# Patient Record
Sex: Male | Born: 1940 | Race: White | Hispanic: No | Marital: Married | Smoking: Current every day smoker
Health system: Southern US, Community
[De-identification: ages and names within clinical notes are randomized; demographics above are authoritative.]

## PROBLEM LIST (undated history)

## (undated) DIAGNOSIS — E785 Hyperlipidemia, unspecified: Secondary | ICD-10-CM

## (undated) DIAGNOSIS — G8929 Other chronic pain: Secondary | ICD-10-CM

## (undated) DIAGNOSIS — IMO0001 Reserved for inherently not codable concepts without codable children: Secondary | ICD-10-CM

## (undated) DIAGNOSIS — I10 Essential (primary) hypertension: Secondary | ICD-10-CM

## (undated) DIAGNOSIS — Z960 Presence of urogenital implants: Secondary | ICD-10-CM

## (undated) DIAGNOSIS — Z9989 Dependence on other enabling machines and devices: Secondary | ICD-10-CM

## (undated) DIAGNOSIS — J45909 Unspecified asthma, uncomplicated: Secondary | ICD-10-CM

## (undated) DIAGNOSIS — R0602 Shortness of breath: Secondary | ICD-10-CM

## (undated) DIAGNOSIS — T7840XA Allergy, unspecified, initial encounter: Secondary | ICD-10-CM

## (undated) DIAGNOSIS — M25569 Pain in unspecified knee: Secondary | ICD-10-CM

## (undated) DIAGNOSIS — Z720 Tobacco use: Secondary | ICD-10-CM

## (undated) DIAGNOSIS — G4733 Obstructive sleep apnea (adult) (pediatric): Secondary | ICD-10-CM

## (undated) DIAGNOSIS — Z87442 Personal history of urinary calculi: Secondary | ICD-10-CM

## (undated) DIAGNOSIS — M1990 Unspecified osteoarthritis, unspecified site: Secondary | ICD-10-CM

## (undated) DIAGNOSIS — E7800 Pure hypercholesterolemia, unspecified: Secondary | ICD-10-CM

## (undated) DIAGNOSIS — E669 Obesity, unspecified: Secondary | ICD-10-CM

## (undated) DIAGNOSIS — N400 Enlarged prostate without lower urinary tract symptoms: Secondary | ICD-10-CM

## (undated) DIAGNOSIS — J449 Chronic obstructive pulmonary disease, unspecified: Secondary | ICD-10-CM

## (undated) DIAGNOSIS — I714 Abdominal aortic aneurysm, without rupture: Secondary | ICD-10-CM

## (undated) HISTORY — DX: Allergy, unspecified, initial encounter: T78.40XA

## (undated) HISTORY — DX: Obesity, unspecified: E66.9

## (undated) HISTORY — DX: Dependence on other enabling machines and devices: Z99.89

## (undated) HISTORY — DX: Obstructive sleep apnea (adult) (pediatric): G47.33

## (undated) HISTORY — DX: Pure hypercholesterolemia, unspecified: E78.00

## (undated) HISTORY — DX: Reserved for inherently not codable concepts without codable children: IMO0001

## (undated) HISTORY — DX: Hyperlipidemia, unspecified: E78.5

## (undated) HISTORY — DX: Chronic obstructive pulmonary disease, unspecified: J44.9

## (undated) HISTORY — DX: Unspecified osteoarthritis, unspecified site: M19.90

## (undated) HISTORY — DX: Shortness of breath: R06.02

## (undated) HISTORY — DX: Abdominal aortic aneurysm, without rupture: I71.4

## (undated) HISTORY — DX: Unspecified asthma, uncomplicated: J45.909

## (undated) HISTORY — DX: Essential (primary) hypertension: I10

## (undated) HISTORY — DX: Tobacco use: Z72.0

---

## 1995-06-24 HISTORY — PX: CHOLECYSTECTOMY: SHX55

## 2000-05-06 ENCOUNTER — Ambulatory Visit (HOSPITAL_COMMUNITY): Admission: RE | Admit: 2000-05-06 | Discharge: 2000-05-06 | Payer: Self-pay | Admitting: Cardiology

## 2003-08-21 ENCOUNTER — Other Ambulatory Visit: Admission: RE | Admit: 2003-08-21 | Discharge: 2003-08-21 | Payer: Self-pay | Admitting: Dermatology

## 2003-10-19 ENCOUNTER — Ambulatory Visit (HOSPITAL_COMMUNITY): Admission: RE | Admit: 2003-10-19 | Discharge: 2003-10-19 | Payer: Self-pay | Admitting: Pulmonary Disease

## 2003-11-13 ENCOUNTER — Ambulatory Visit (HOSPITAL_COMMUNITY): Admission: RE | Admit: 2003-11-13 | Discharge: 2003-11-13 | Payer: Self-pay | Admitting: Pulmonary Disease

## 2004-06-23 HISTORY — PX: COLONOSCOPY: SHX174

## 2004-07-01 ENCOUNTER — Ambulatory Visit: Payer: Self-pay | Admitting: Internal Medicine

## 2004-07-01 ENCOUNTER — Ambulatory Visit (HOSPITAL_COMMUNITY): Admission: RE | Admit: 2004-07-01 | Discharge: 2004-07-01 | Payer: Self-pay | Admitting: Internal Medicine

## 2005-03-06 ENCOUNTER — Observation Stay (HOSPITAL_COMMUNITY): Admission: EM | Admit: 2005-03-06 | Discharge: 2005-03-07 | Payer: Self-pay | Admitting: Emergency Medicine

## 2005-03-07 ENCOUNTER — Ambulatory Visit: Payer: Self-pay | Admitting: Cardiology

## 2005-03-10 ENCOUNTER — Encounter (HOSPITAL_COMMUNITY): Admission: RE | Admit: 2005-03-10 | Discharge: 2005-03-10 | Payer: Self-pay | Admitting: Cardiology

## 2005-03-10 ENCOUNTER — Ambulatory Visit: Payer: Self-pay | Admitting: *Deleted

## 2005-03-13 ENCOUNTER — Ambulatory Visit: Payer: Self-pay | Admitting: Cardiology

## 2005-08-07 ENCOUNTER — Other Ambulatory Visit: Admission: RE | Admit: 2005-08-07 | Discharge: 2005-08-07 | Payer: Self-pay | Admitting: Unknown Physician Specialty

## 2005-10-14 ENCOUNTER — Ambulatory Visit (HOSPITAL_COMMUNITY): Admission: RE | Admit: 2005-10-14 | Discharge: 2005-10-14 | Payer: Self-pay | Admitting: Pulmonary Disease

## 2006-10-20 ENCOUNTER — Ambulatory Visit (HOSPITAL_COMMUNITY): Admission: RE | Admit: 2006-10-20 | Discharge: 2006-10-20 | Payer: Self-pay | Admitting: Urology

## 2006-12-19 ENCOUNTER — Emergency Department (HOSPITAL_COMMUNITY): Admission: EM | Admit: 2006-12-19 | Discharge: 2006-12-19 | Payer: Self-pay | Admitting: Emergency Medicine

## 2006-12-22 ENCOUNTER — Ambulatory Visit (HOSPITAL_COMMUNITY): Admission: RE | Admit: 2006-12-22 | Discharge: 2006-12-22 | Payer: Self-pay | Admitting: Pulmonary Disease

## 2007-01-25 ENCOUNTER — Ambulatory Visit: Payer: Self-pay | Admitting: Surgery

## 2007-03-09 ENCOUNTER — Ambulatory Visit: Payer: Self-pay | Admitting: Pulmonary Disease

## 2007-03-24 ENCOUNTER — Ambulatory Visit: Payer: Self-pay | Admitting: Pulmonary Disease

## 2007-03-24 ENCOUNTER — Ambulatory Visit (HOSPITAL_BASED_OUTPATIENT_CLINIC_OR_DEPARTMENT_OTHER): Admission: RE | Admit: 2007-03-24 | Discharge: 2007-03-24 | Payer: Self-pay | Admitting: Pulmonary Disease

## 2007-04-19 ENCOUNTER — Ambulatory Visit (HOSPITAL_BASED_OUTPATIENT_CLINIC_OR_DEPARTMENT_OTHER): Admission: RE | Admit: 2007-04-19 | Discharge: 2007-04-19 | Payer: Self-pay | Admitting: Pulmonary Disease

## 2007-04-19 ENCOUNTER — Ambulatory Visit: Payer: Self-pay | Admitting: Pulmonary Disease

## 2007-05-06 ENCOUNTER — Encounter: Payer: Self-pay | Admitting: Pulmonary Disease

## 2007-05-06 DIAGNOSIS — M109 Gout, unspecified: Secondary | ICD-10-CM | POA: Insufficient documentation

## 2007-05-06 DIAGNOSIS — E7800 Pure hypercholesterolemia, unspecified: Secondary | ICD-10-CM | POA: Insufficient documentation

## 2007-05-06 DIAGNOSIS — I10 Essential (primary) hypertension: Secondary | ICD-10-CM | POA: Insufficient documentation

## 2007-05-06 DIAGNOSIS — F329 Major depressive disorder, single episode, unspecified: Secondary | ICD-10-CM

## 2007-05-06 DIAGNOSIS — Z87898 Personal history of other specified conditions: Secondary | ICD-10-CM | POA: Insufficient documentation

## 2007-05-06 DIAGNOSIS — I714 Abdominal aortic aneurysm, without rupture, unspecified: Secondary | ICD-10-CM | POA: Insufficient documentation

## 2007-05-06 DIAGNOSIS — F3289 Other specified depressive episodes: Secondary | ICD-10-CM | POA: Insufficient documentation

## 2007-06-09 ENCOUNTER — Ambulatory Visit: Payer: Self-pay | Admitting: Pulmonary Disease

## 2007-06-09 DIAGNOSIS — G4733 Obstructive sleep apnea (adult) (pediatric): Secondary | ICD-10-CM | POA: Insufficient documentation

## 2007-06-09 DIAGNOSIS — Z720 Tobacco use: Secondary | ICD-10-CM | POA: Insufficient documentation

## 2007-06-09 DIAGNOSIS — F17200 Nicotine dependence, unspecified, uncomplicated: Secondary | ICD-10-CM | POA: Insufficient documentation

## 2007-07-02 ENCOUNTER — Encounter: Payer: Self-pay | Admitting: Pulmonary Disease

## 2007-07-05 ENCOUNTER — Telehealth (INDEPENDENT_AMBULATORY_CARE_PROVIDER_SITE_OTHER): Payer: Self-pay | Admitting: *Deleted

## 2007-07-06 ENCOUNTER — Encounter: Payer: Self-pay | Admitting: Pulmonary Disease

## 2008-01-22 DIAGNOSIS — I714 Abdominal aortic aneurysm, without rupture, unspecified: Secondary | ICD-10-CM

## 2008-01-22 HISTORY — DX: Abdominal aortic aneurysm, without rupture: I71.4

## 2008-01-22 HISTORY — DX: Abdominal aortic aneurysm, without rupture, unspecified: I71.40

## 2008-02-21 ENCOUNTER — Ambulatory Visit: Payer: Self-pay | Admitting: Surgery

## 2008-05-04 ENCOUNTER — Ambulatory Visit: Payer: Self-pay | Admitting: Vascular Surgery

## 2008-07-03 ENCOUNTER — Ambulatory Visit (HOSPITAL_COMMUNITY): Admission: RE | Admit: 2008-07-03 | Discharge: 2008-07-03 | Payer: Self-pay | Admitting: Pulmonary Disease

## 2008-08-07 ENCOUNTER — Ambulatory Visit: Payer: Self-pay | Admitting: Surgery

## 2008-12-04 ENCOUNTER — Telehealth: Payer: Self-pay | Admitting: Pulmonary Disease

## 2008-12-20 ENCOUNTER — Ambulatory Visit: Payer: Self-pay | Admitting: Pulmonary Disease

## 2009-06-23 DIAGNOSIS — IMO0001 Reserved for inherently not codable concepts without codable children: Secondary | ICD-10-CM

## 2009-06-23 HISTORY — DX: Reserved for inherently not codable concepts without codable children: IMO0001

## 2009-07-11 ENCOUNTER — Encounter: Payer: Self-pay | Admitting: Pulmonary Disease

## 2010-03-11 ENCOUNTER — Ambulatory Visit: Payer: Self-pay | Admitting: Cardiology

## 2010-03-18 ENCOUNTER — Ambulatory Visit: Payer: Self-pay | Admitting: Cardiology

## 2010-06-10 ENCOUNTER — Ambulatory Visit: Payer: Self-pay | Admitting: Cardiology

## 2010-07-25 NOTE — Letter (Signed)
Summary: LMN/[facility]  LMN/[facility]   Imported By: Lester  07/16/2009 14:23:06  _____________________________________________________________________  External Attachment:    Type:   Image     Comment:   External Document

## 2010-08-19 ENCOUNTER — Ambulatory Visit (INDEPENDENT_AMBULATORY_CARE_PROVIDER_SITE_OTHER): Payer: Medicare Other | Admitting: Surgery

## 2010-08-19 ENCOUNTER — Encounter (INDEPENDENT_AMBULATORY_CARE_PROVIDER_SITE_OTHER): Payer: Medicare Other

## 2010-08-19 DIAGNOSIS — I714 Abdominal aortic aneurysm, without rupture: Secondary | ICD-10-CM

## 2010-08-20 NOTE — Assessment & Plan Note (Signed)
OFFICE VISIT  Kevin Frank, Kevin Frank DOB:  24-Dec-1940                                       08/19/2010 EXBMW#:[id number]  REASON FOR VISIT:  Followup aneurysm.  HISTORY:  This is a 70 year old gentleman that I am following for an abdominal aortic aneurysm that was detected by MRI in 2008 for back pain.  I last saw him in 2010.  At that time his aneurysm was approximately 3 cm and he was asymptomatic.  He comes back in today for followup.  The patient is diabetic.  He tells me his most recent A1c is 5.7.  He also suffers from hypercholesterolemia.  His most recent cholesterol evaluation per the patient was a good report.  His blood pressure is also controlled medically.  REVIEW OF SYSTEMS:  VASCULAR:  Positive for pain in legs with walking. CARDIAC:  Negative for chest pain or shortness of breath. GU:  Positive for frequent urination. MUSCULOSKELETAL:  Positive for arthritis. All other review of systems are negative.  SOCIAL HISTORY:  The patient continues to smoke 2 packs per day.  PHYSICAL EXAMINATION:  Vital signs:  Heart rate 63, blood pressure 130/72, O2 sat 99% on room air.  General:  He is well-appearing, in no distress.  HEENT:  Within normal limits.  Lungs:  Clear bilaterally. Occasional wheezes.  Cardiovascular:  Regular rate and rhythm.  No murmur.  No carotid bruits.  Abdomen:  Soft, nontender. Musculoskeletal:  Without major deformity.  Neurological:  No focal deficits.  Skin:  Without rash.  DIAGNOSTIC STUDIES:  Duplex ultrasound was performed today which shows that his aneurysm has increased in size.  It now measures 3.3 x 3.2, previously it was 3.0 x 3.0.  ASSESSMENT:  Abdominal aortic aneurysm.  PLAN:  The patient continues to have a very small abdominal aortic aneurysm.  I think his risk of rupture at this size is extremely low. However, it may grow over time and therefore we will need to continue to follow him with ultrasound.  I  have scheduled him to come back in 1 year.  I spent approximately 8 minutes discussing smoking cessation with the patient.  We discussed the financial burden as well as the health burden that he is taking and I proposed that he try to cut back from 2 packs to 1 pack over the next year.    Jorge Ny, MD Electronically Signed  VWB/MEDQ  D:  08/19/2010  T:  08/20/2010  Job:  [id number]  cc:   Ramon Dredge L. Juanetta Gosling, M.D.

## 2010-08-27 NOTE — Procedures (Unsigned)
DUPLEX ULTRASOUND OF ABDOMINAL AORTA  INDICATION:  AAA followup.  HISTORY: Diabetes:  Yes. Cardiac:  No. Hypertension:  Yes. Smoking:  Yes, 2 packs per day. Connective Tissue Disorder: Family History:  No. Previous Surgery:  No.  DUPLEX EXAM:         AP (cm)                   TRANSVERSE (cm) Proximal             3.34 cm                   3.23 cm Mid                  2.16 cm                   2.20 cm Distal               2.54 cm                   2.48 cm Right Iliac          0.86 cm                   1.01 cm Left Iliac           0.66 cm                   0.84 cm  PREVIOUS:  Date: 08/07/2008  AP:  3.0  TRANSVERSE:  3.0  IMPRESSION: 1. Aneurysmal dilatation of the abdominal aorta that measures 3.34 X     3.23 cm. 2. This is a slightly larger diameter than the previous examination.  ___________________________________________ V. Charlena Cross, MD  EM/MEDQ  D:  08/19/2010  T:  08/19/2010  Job:  [id number]

## 2010-11-05 NOTE — Assessment & Plan Note (Signed)
OFFICE VISIT   Kevin Frank, Kevin Frank  DOB:  24-Mar-1941                                       01/25/2007  [id number]#:[id number]   REASON FOR VISIT:  Evaluate new diagnosis of abdominal aortic aneurysm.   HISTORY:  This is a 70 year old gentleman I am seeing referred by Dr.  Juanetta Gosling for evaluation of a new diagnosis of abdominal aortic aneurysm.  The patient recently was evaluated by MRI for low back pain.  An  incidental finding on his MRI was a 3.2 cm infrarenal abdominal aortic  aneurysm.  The patient does not have any complaints of abdominal pain.   PAST MEDICAL HISTORY:  Significant for high blood pressure, high  cholesterol, chronic obstructive pulmonary disease and gout.   PAST SURGICAL HISTORY:  Laparoscopic cholecystectomy.   FAMILY HISTORY:  Significant for a mother with diabetes, cerebrovascular  disease and lymphoma.   SOCIAL HISTORY:  The patient is a two pack a day smoker for 50+ years.  He is not a regular drinker.   ALLERGIES:  No known drug allergies.   MEDICATIONS:  Simvastatin 40 mg once daily.  Flomax 0.4 mg once daily.  Sertraline 50 mg once daily.  Allopurinol 300 mg once daily.  Metoprolol extended release 25 mg a half a tab daily.  Detrol long acting 4 mg once daily.   REVIEW OF SYMPTOMS:  GENERAL:  No weight loss, loss of appetite.  CARDIAC:  No chest pain.  PULMONARY:  Productive cough with wheezing and shortness of breath.  GI:  Patient has complaints of diarrhea.  GU:  No hematuria or dysuria.  VASCULAR:  Negative for claudication or rest pain.  NEUROLOGIC:  No dizziness, blackouts, headaches or seizures.  PSYCHIATRIC:  History of depression.   PHYSICAL EXAMINATION:  Heart rate 75.  Left arm blood pressure is  178/116.  Right arm blood pressure is 190/114.  GENERAL:  He is well appearing, no acute distress.  CARDIOVASCULAR:  Regular rate and rhythm.  NECK:  No carotid bruits.  RESPIRATIONS:  Clear and nonlabored.  ABDOMEN:   Soft, nontender, no pulsatile mass.  EXTREMITIES:  Warm and well perfused.  NEUROLOGIC:  No gross deficits.   DIAGNOSTIC STUDIES:  Patient comes with a report of an MRI which reveals  a 3.2 cm transverse dimension infrarenal abdominal aortic aneurysm with  no extension into the iliac arteries.   ASSESSMENT AND PLAN:  A 70 year old with asymptomatic small infrarenal  abdominal aortic aneurysm.   Plan at this time:  1. The patient will follow up in one year with an abdominal ultrasound      to evaluate the size of this aneurysm.  2. I will encourage the patient with tobacco cessation.  He was told      the hallmarks of a ruptured aneurysm and to go to the emergency      department should this occur; however, this is unlikely given the      size of his aneurysm.   Jorge Ny, MD  Electronically Signed   VWB/MEDQ  D:  01/25/2007  T:  01/26/2007  Job:  [id number]

## 2010-11-05 NOTE — Procedures (Signed)
NAME:  Kevin Frank, Kevin Frank              ACCOUNT NO.:  [id number]   MEDICAL RECORD NO.:  [id number]          PATIENT TYPE:  OUT   LOCATION:  SLEEP CENTER                 FACILITY:  [facility]   PHYSICIAN:  Coralyn Helling, MD        DATE OF BIRTH:  [birth year]   DATE OF STUDY:  04/19/2007                            NOCTURNAL POLYSOMNOGRAM   REFERRING PHYSICIAN:  Coralyn Helling, MD   INDICATION FOR STUDY:  This is an individual who had an overnight  polysomnogram done on March 24, 2007, and was found to have severe  obstructive sleep apnea with an apnea/hypopnea index of 53 and an oxygen  saturation nadir of 74%.  He was tried on CPAP but had a suboptimal  titration.  As a result, he returns to the sleep lab for a BiPAP  titration study.   EPWORTH SLEEPINESS SCORE:  14   MEDICATIONS:  Sertraline, simvastatin, metoprolol, furosemide,  lisinopril, Detrol, Flomax, allopurinol.   SLEEP ARCHITECTURE:  Total recording time was 391 minutes.  Total sleep  time was 347 minutes.  Sleep efficiency is 88%.  The patient was  observed in all stages of sleep but had a relative reduction in the  percentage of slow-wave sleep to 1% of the study.  Sleep latency was 8.5  minutes which is slightly reduced.  REM latency was 69 minutes.  The  patient was observed in both the supine and nonsupine position.   RESPIRATORY DATA:  The average respiratory rate was 20.  The patient was  titrated from a BiPAP ratio setting of 8/4 to 17/14.  At a BiPAP  pressure setting of 17/14 the apnea/hypopnea index was reduced to 4.9.  At this pressure setting, the patient was observed in REM sleep but only  had minimal amount of supine sleep.  Additionally, at this pressure  setting, snoring was eliminated.   OXYGEN DATA:  The baseline oxygenation was 95%.  The oxygen saturation  nadir was 89%.  At a BiPAP pressuring setting of 17/14, the lowest  oxygen saturation was 91%.  The study was done without the use of  supplemental oxygen.   CARDIAC DATA:  The average heart rate was 66, and the rhythm strip  showed normal sinus rhythm.   MOVEMENT-PARASOMNIA:  The periodic limb movement index was 0.2.   IMPRESSIONS-RECOMMENDATIONS:  This is a BiPAP titration study.  At a  BiPAP pressure setting of 17/14 the apnea/hypopnea index is reduced to  4.9.  At this pressure setting, the patient was observed in REM sleep,  and snoring was eliminated; however, he had very minimal amount of  supine sleep.  What I would recommend is set the patient on BiPAP at  17/14 and then monitor him for a clinical response.  If he is still  having difficulties after this, he  may need to have further augmentation in his pressure setting.  Alternatively, he could undergo positional therapy in conjunction with  the use of his BiPAP.      Coralyn Helling, MD  Diplomat, American Board of Sleep Medicine  Electronically Signed     VS/MEDQ  D:  05/03/2007 09:31:25  T:  05/03/2007 10:30:03  Job:  [id number]

## 2010-11-05 NOTE — Assessment & Plan Note (Signed)
OFFICE VISIT   Kevin, Frank  DOB:  [birth year]                                       08/07/2008  UVOZD#:[id number]   REASON FOR VISIT:  Follow up aneurysm.   HISTORY:  This is a 70 year old gentleman I have been following for  infrarenal abdominal aortic aneurysm which was detected by MRI in 2008  for back pain.  He comes back in today for ultrasound evaluation.  He  has been asymptomatic and denies any abdominal pain.  He does continue  to smoke.   PHYSICAL EXAMINATION:  His blood pressure is 136/83, pulse is 73.  General:  He is well-appearing, in no distress.  Extremities:  Warm and  well-perfused.  Abdomen is obese but soft.   DIAGNOSTIC STUDIES:  Abdominal aortic aneurysm by ultrasound remains  stable approximately 3 cm.   PLAN:  I discussed with the patient the need for smoking cessation.  With regard to his abdominal aortic aneurysm, it has been stable over  the past 2 years.  I think we can go 2 years before repeating his  ultrasound.  I am going to see him back in 2 years with an abdominal  aortic ultrasound.   Jorge Ny, MD  Electronically Signed   VWB/MEDQ  D:  08/07/2008  T:  08/08/2008  Job:  [id number]   cc:   Ramon Dredge L. Juanetta Gosling, M.D.

## 2010-11-05 NOTE — Procedures (Signed)
Kevin Frank, Kevin Frank              ACCOUNT NO.:  [id number]   MEDICAL RECORD NO.:  [id number]          PATIENT TYPE:  OUT   LOCATION:  SLEEP CENTER                 FACILITY:  [facility]   PHYSICIAN:  Coralyn Helling, MD        DATE OF BIRTH:  [birth year]   DATE OF STUDY:  03/24/2007                            NOCTURNAL POLYSOMNOGRAM   REFERRING PHYSICIAN:   REFERRING PHYSICIAN:  Coralyn Helling, M.D.   INDICATION FOR STUDY:  This is an individual who has a history of  depression and cardiovascular disease as well as sleep disruption and  excessive daytime sleepiness.  He is referred to the sleep lab for  evaluation of hypersomnia with obstructive sleep apnea.   EPWORTH SLEEPINESS SCORE:  14   MEDICATIONS:  Sertraline, Simvastatin, metoprolol, furosemide,  lisinopril, Detrol, Flomax, and allopurinol.   SLEEP ARCHITECTURE:  Total recording time was 390 minutes.  Total sleep  time was 309 minutes.  Sleep efficiency was 79%.  Sleep latency is 14.5  minutes.  REM latency is 217 minutes which was prolonged.  The study was  notable for lack of slow wave sleep.  The patient slept in both the  supine and non-supine position.   RESPIRATORY DATA:  The average respiratory rate was 18.  The patient  followed a split-night study protocol.  During the diagnostic portion of  the test, he was found to have an overall apnea/hypopnea index of 53.  The events were exclusively obstructive in nature.  Loud snoring was  noted by the technician.  There did appear to be a positional effect.  During the therapeutic portion of the test the patient was titrated from  a CPAP pressure setting of 5 to 25 cm of water.  It appeared that in the  non-supine position at a CPAP pressure setting of 10, the patient had a  reduction of his apnea/hypopnea index to 0.  He was observed in REM  sleep with this pressure setting.  However, in the supine position, he  had return of severe obstructive events which were partially resolved  at  a CPAP pressure setting of 25.  At this pressure setting of 25, his  apnea/hypopnea index was reduced to 7.7 and he was observed in both REM  sleep and supine sleep, and snoring was eliminated.   OXYGEN DATA:  Baseline oxygenation was 93%.  The oxygen saturation nadir  was 74%. At a CPAP pressure setting of 25 the mean oxygenation during  non-REM sleep was 96% and the mean oxygenation during REM sleep was 95%.   CARDIAC DATA:  The average heart rate was 73 and the rhythm strip showed  normal sinus rhythm.   MOVEMENT-PARASOMNIA:  The periodic limb movement index was 0.8 and the  patient had two restroom trips.   IMPRESSIONS-RECOMMENDATIONS:  This was a split-night study protocol.  During the diagnostic portion of the test, the patient was found to have  severe obstructive sleep apnea as demonstrated by an apnea/hypopnea  index of 53 and an oxygen saturation nadir of 74%.  During the  therapeutic portion of the test, he was titrated to a CPAP pressure  setting of 25  cm of water with a reduction of his apnea/hypopnea index  to 8, although it appeared that he still had some respiratory events as  well as some difficulty maintaining his oxygenation and particularly  during REM sleep.  What I would recommend is to have the patient return  to the sleep lab for a BiPAP titration study as well as to determine if  he would need supplemental oxygen.      Coralyn Helling, MD  Diplomat, American Board of Sleep Medicine  Electronically Signed     VS/MEDQ  D:  04/09/2007 09:37:10  T:  04/10/2007 10:01:19  Job:  [id number]

## 2010-11-05 NOTE — Procedures (Signed)
DUPLEX ULTRASOUND OF ABDOMINAL AORTA   INDICATION:  Known abdominal aortic aneurysm.   HISTORY:  Diabetes:  No.  Cardiac:  No.  Hypertension:  Yes.  Smoking:  Yes.  Connective Tissue Disorder:  Family History:  No.  Previous Surgery:  No.   DUPLEX EXAM:         AP (cm)                   TRANSVERSE (cm)  Proximal             Not visualized            Not visualized  Mid                  2.5 cm                    2.4 cm  Distal               3.2 cm                    2.5 cm  Right Iliac          1.5 cm                    1.5 cm  Left Iliac           1.5 cm                    1.6 cm   PREVIOUS:  Date:  AP:  TRANSVERSE:   IMPRESSION:  1. Aneurysm of the distal abdominal aorta noted.  2. Limited visualization of the abdominal aorta due to overlying bowel      gas pattern and patient body habitus.   ___________________________________________  V. Charlena Cross, MD   CH/MEDQ  D:  02/21/2008  T:  02/21/2008  Job:  [id number]

## 2010-11-05 NOTE — Procedures (Signed)
DUPLEX ULTRASOUND OF ABDOMINAL AORTA   INDICATION:  Follow-up evaluation of known abdominal aortic aneurysm.   HISTORY:  Diabetes:  No.  Cardiac:  No.  Hypertension:  Yes.  Smoking:  Yes.  Connective Tissue Disorder:  Family History:  No.  Previous Surgery:  No.   DUPLEX EXAM:         AP (cm)                   TRANSVERSE (cm)  Proximal             2.2 cm                    2.1 cm  Mid                  3.0 cm                    3.0 cm  Distal               1.8 cm                    1.9 cm  Right Iliac          0.7 cm                    0.9 cm  Left Iliac           0.6 cm                    0.8 cm   PREVIOUS:  Date: 02/21/2008  AP:  3.2  TRANSVERSE:  2.5   IMPRESSION:  Abdominal aortic aneurysm measurements are stable compared  to previous study.   ___________________________________________  V. Charlena Cross, MD   MC/MEDQ  D:  08/07/2008  T:  08/07/2008  Job:  [id number]

## 2010-11-05 NOTE — Assessment & Plan Note (Signed)
[facility]                             PULMONARY OFFICE NOTE   BRENT, TAILLON                     MRN:          [id number]  DATE:04/12/2007                            DOB:          26-Jun-1940    I called [name] to discuss the results of his sleep study which was  done on March 24, 2007.  This showed an apnea/hypopnea index of 58 with  an oxygen saturation nadir of 84%.  He had followed a split night study  protocol.  He was titrated to a CPAP pressure setting of 25,  with a  reduction in his apnea/hypopnea index to 7.7.  However, he was still  having respiratory events as well as occasional oxygen desaturations.   I discussed these results with [name] wife.  What I will do is  make arrangements for him to undergo an in-lab BiPAP titration study  possibly with the use of supplemental oxygen and then follow up with him  after I have a chance to review this.  I have also advised him that if  he has any further questions prior to having his BiPAP titration study  that he contact me in the office.     Coralyn Helling, MD  Electronically Signed    VS/MedQ  DD: 04/12/2007  DT: 04/12/2007  Job #: [id number]

## 2010-11-05 NOTE — Assessment & Plan Note (Signed)
OFFICE VISIT   Kevin Frank, Kevin Frank  DOB:  02/23/1941                                       02/21/2008  [id number]#:[id number]   REASON FOR VISIT:  Abdominal aortic aneurysm.   HISTORY:  This is a 70 year old gentleman that I initially saw for  evaluation of abdominal aortic aneurysm that was evaluated by MRI for  back pain.  One year ago it measured 3.2 cm, he comes back in today for  a repeat evaluation.  He has been asymptomatic.  He does not have any  abdominal pain.   PHYSICAL EXAMINATION:  Blood pressure 139/86, pulse 64.  General:  He is  well-appearing, in no acute stress.  Cardiovascular:  Regular rate and  rhythm.  Abdomen:  Obese, soft, nontender.  No pulsatile mass.   DIAGNOSTIC STUDIES:  Ultrasound was performed today.  This reveals  abdominal aortic aneurysm measuring 3.2 cm in its largest diameter.   ASSESSMENT/PLAN:  Small abdominal aortic aneurysm.   PLAN:  The patient be scheduled to come back in 1 year for repeat  ultrasound.  We discussed the signs and symptoms and what to do for a  ruptured aneurysm.  We also discussed smoking cessation.   Jorge Ny, MD  Electronically Signed   VWB/MEDQ  D:  02/21/2008  T:  02/22/2008  Job:  [id number]   cc:   Ramon Dredge L. Juanetta Gosling, M.D.

## 2010-11-05 NOTE — Assessment & Plan Note (Signed)
[facility]                             PULMONARY OFFICE NOTE   Kevin Frank, Kevin Frank                     MRN:          [id number]  DATE:03/09/2007                            DOB:          June 26, 1940    REFERRING PHYSICIAN:  Colleen Can. Deborah Chalk, M.D.   I met the patient with his wife today for evaluation of sleep  difficulties.   He says he has been having a problem with this for years, but apparently  has been getting progressively worse.  He will usually got to sleep at  around 9:30 at night.  He has no trouble falling asleep.  He wakes up  two to three times during the night to use the bathroom, although, he  says he has problems with his prostate.  He will then wake up between 4-  5 o'clock in the morning.  He will get up and have a cup of coffee and  then fall back to sleep.  He says if he sits down idly, he will fall  asleep very easily, especially when he is watching TV.  He has also  occasionally caught himself nodding off while driving, although, he  denies ever having an accident because of that.  His wife says that he  snores quite loudly to the point where she has to sleep in a separate  room.  She has also seen him stop breathing while he is asleep.  He will  occasionally wake up with a gagging sensation and he tends to breathe  through his mouth at night.  He denies any history of sleep walking,  sleep talking, nightmares, or night terrors.  He does not currently use  anything to help him fall asleep at night.  He drinks two to three cups  of coffee in the morning as well as several cups of sweet tea during the  day.  He does occasionally get odd feelings in his legs if he is sitting  down watching TV where he feels like he has to just keep moving his  legs.  There is no history of sleep hallucinations, sleep paralysis, or  cataplexy.  He denies any history of bruxism.   PAST MEDICAL HISTORY:  Significant for depression, BPH, gout,  hypertension, elevated cholesterol, abdominal aortic aneurysm.   CURRENT MEDICATIONS:  1. Sertraline 50 mg daily.  2. Simvastatin 40 mg daily.  3. Metoprolol ER 25 mg daily.  4. Furosemide 20 mg daily.  5. Lisinopril 5 mg daily.  6. Detrol LA 4 mg daily.  7. Flomax 0.4 mg daily.   ALLERGIES:  No known drug allergies.   SOCIAL HISTORY:  He is married.  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  He continues to smoke two packs of  cigarettes a day and has done so for the last 40 years.  He has  occasional alcohol use.   FAMILY HISTORY:  Significant for his mother had lymphoma.  His father  had liver disease.   PAST SURGICAL HISTORY:  Significant for cholecystectomy in 1997.   REVIEW OF  SYSTEMS:  He has gained approximately 20 pounds over the last  two years.  He does have occasional cough at night as well as phlegm  production.  His Epworth's score today is 12/24.   PHYSICAL EXAMINATION:  VITAL SIGNS:  He is 5 feet 10 inches tall, 232  pounds.  Temperature 98, blood pressure 150/100, heart rate 88, oxygen  saturation 95% on room air.  HEENT:  Pupils are reactive.  There is no sinus tenderness.  There is  clear discharge.  He has a Mallampati III airway.  There are no oral  lesions.  He has poor oral dentition.  There is no lymphadenopathy and  no thyromegaly.  HEART:  S1 and S2.  CHEST:  Prolonged expiratory phase, but no wheezing or rales.  ABDOMEN:  Obese, soft, and nontender.  EXTREMITIES:  Minimal ankle edema.  NEUROLOGY:  No focal deficits were appreciated.   IMPRESSION:  1. He does have symptoms which are very suggestive of sleep apnea.      This is of particular concern given his history of hypertension.  I      discussed with him the diagnosis of sleep apnea.  I reviewed with      him the adverse consequences of sleep apnea including increased      risk of hypertension, coronary artery disease, cerebrovascular      disease, diabetes,  and arrhythmias.  I discussed with him the      importance of diet, exercise, and weight reduction as well as      avoidance of alcohol and sedatives.  Driving precautions were      reviewed with him as well.  I will make arrangements for him to      undergo an overnight polysomnogram to further evaluate this.  2. He does have symptoms which may be suggestive of restless leg      syndrome, although does not appear to be terribly prominent at the      present time.  I would review his sleep study first and then decide      if any further interventions were necessary for this.  3. Tobacco abuse.  I discussed with him the importance of smoking      cessation and discussed with him various techniques to try and help      with this.  He says he will take this under consideration.  4. Hypertension.  He is due to have follow-up with his primary care      doctor as well as [name] for further assessment of this.     Coralyn Helling, MD  Electronically Signed    VS/MedQ  DD: 03/09/2007  DT: 03/10/2007  Job #: [id number]   cc:   Colleen Can. Deborah Chalk, M.D.

## 2010-11-08 NOTE — H&P (Signed)
NAME:  Kevin, [name]              ACCOUNT NO.:  [id number]   MEDICAL RECORD NO.:  [id number]          PATIENT TYPE:  INP   LOCATION:  A201                          FACILITY:  [facility]   PHYSICIAN:  Edward L. Juanetta Gosling, M.D.DATE OF BIRTH:  January 09, 1941   DATE OF ADMISSION:  03/06/2005  DATE OF DISCHARGE:  LH                                HISTORY & PHYSICAL   REASON FOR ADMISSION:  Chest discomfort.   HISTORY OF PRESENT ILLNESS:  [name] is a 70 year old who has a long  known history of cigarette smoking and who was at store discussing politics  with a neighbor when he started having substernal chest pain.  The pain is  described as a pressure tightness in his chest.  It sort of waxed and waned.  He thought it was going away.  He went to his truck and drank a soft drink  which did not seem to make any difference.  It continued its waxing and  waning course as he was driving home and then seemed to get much worse.  It  was associated with mild diaphoresis, a sensation of some tingling in his  right arm, not his left and just an uncomfortable sensation of not feeling  well.  He came by my office and I directed him to the emergency room.  In  the emergency room, he was given aspirin and his pain resolved.  It is not  clear whether the aspirin had even had enough time before the pain went  away.  He has not had anything exactly similar.  He had seen a cardiologist,  [name], in [place], several years ago and had a workup that as best  I can tell an echocardiogram.  I do not think he had a stress test at that  time.   PAST MEDICAL HISTORY:  1.  Hypertension, a fairly new diagnosis.  He is taking Benicar 20 mg daily      for that.  2.  Hyperlipidemia for which he takes Lipitor.  3.  Chronic obstructive pulmonary disease.  4.  He had a colonoscopy with a snare polypectomy done in January 2006.   SOCIAL HISTORY:  He has about a 60- to 70-pack-year smoking history.  He  lives at home  with his wife.  He has worked in farming.   FAMILY HISTORY:  He says that uncles on both his mother's and father's side  had heart attacks, but neither his mother or his father did as far as he  knows.   REVIEW OF SYSTEMS:  Except as mentioned, essentially negative.  He has been  using Mucinex, he says, for cough and congestion and is helping.   PHYSICAL EXAMINATION:  VITAL SIGNS:  Blood pressure 178/93, pulse rate 83,  respirations 16, temperature 98.2.  O2 saturations 96% on room air.  HEENT:  Pupils are reactive.  Nose and throat are clear.  His face is  somewhat plethoric.  Mucous membranes are moist.  NECK:  No bruits, no jugular venous distention.  CHEST:  Rhonchi bilaterally which is a chronic finding.  HEART:  Regular.  Heart sounds are somewhat obscured.  He does not have a  murmur or gallop.  ABDOMEN:  Soft, without masses.  No edema.  He does not have any palpable  organs in his abdomen.  EXTREMITIES:  No edema.  Pulses are decreased, but present.   LABORATORY DATA AND X-RAY FINDINGS:  Electrolytes are normal.  Glucose 127.  CBC shows white count 10,600, hemoglobin 15.9, platelets 225.  Point of care  cardiac markers with MB less than 1, troponin less than 0.05, myoglobin 36.   EKG does not show evidence of an acute MI.   ASSESSMENT:  He may well have something like unstable angina pectoris.  He  may have other problems as well.  This could be reflux, but is concerning at  least by his history for perhaps cardiac disease.   PLAN:  1.  Go ahead and admit him.  Put him on full dose Lovenox for now.  2.  Cycle his cardiac enzymes and EKGs.  3.  Continue Lipitor.  4.  I am going to put him on metoprolol p.o. and see how he does with that.      He may not tolerate it because of his COPD.  5.  Ask for [facility] cardiology consult.  6.  Put him on Protonix.  7.  Get an echocardiogram and then decide what we need to do from there.      Edward L. Juanetta Gosling, M.D.   Electronically Signed     ELH/MEDQ  D:  03/06/2005  T:  03/06/2005  Job:  [id number]

## 2010-11-08 NOTE — Consult Note (Signed)
[name], [name]              ACCOUNT NO.:  [id number]   MEDICAL RECORD NO.:  [id number]          PATIENT TYPE:  INP   LOCATION:  A201                          FACILITY:  [facility]   PHYSICIAN:  Farnam Bing, M.D. [facility] OF BIRTH:  09-02-1940   DATE OF CONSULTATION:  03/07/2005  DATE OF DISCHARGE:                                   CONSULTATION   REFERRING PHYSICIAN:  Dr. Juanetta Gosling.   HISTORY OF PRESENT ILLNESS:  A 70 year old gentleman admitted to the  hospital for evaluation of chest discomfort. Kevin [name] has had a number of  stress test in the past, none more recently been 10 years ago when he was  evaluated by [name]. All of these have apparently been negative. He has  not had much in the way of chest discomfort in recent years. He does  occasionally have some lateral chest aching that he attributes to chronic  lung problems related to cigarette smoking. He has a chronic cough  productive of sputum. While at rest, he developed a moderately severe mid  substernal chest discomfort yesterday. This persisted for perhaps an hour  with some waxing and waning prompting him to seek medical attention. He  initially presented to his physician's office, but was appropriately  referred to the emergency department where his symptoms faded. There was no  associated diaphoresis, nausea, emesis or dyspnea. He may have had some  associated vague discomfort in the right arm.   Kevin [name] has multiple cardiovascular risk factors including hypertension,  hyperlipidemia and tobacco use.   PAST MEDICAL HISTORY:  Past medical history is otherwise notable for gout,  colonic polyps and diverticular disease. He has never undergone any surgical  procedures.   MEDICATIONS:  Recent medications include:  1.  Atorvastatin 80 mg q.d.  2.  Protonix 40 mg q.d.  3.  Guaifenesin 1,200 mg b.i.d..   SOCIAL HISTORY:  Retired from farming; married with two children and lives  in [place]; greater than  100 pack-year history of tobacco use; no excessive  use of alcohol.   FAMILY HISTORY:  Mother died of lymphoma and father of cirrhosis. No  prominent history of coronary disease.   REVIEW OF SYSTEMS:  Notable for occasional orthopedic problems in the neck  and shoulder, occasional headaches, chronic cough and wheezing. All other  systems reviewed and are negative.   PHYSICAL EXAMINATION:  GENERAL:  On exam, very pleasant gentleman with a  gravelly voice in no acute distress.  VITAL SIGNS:  Temperature is 98.2, heart rate  65 and regular, respirations  20, blood pressure 140/80, weight 228, O2 sat 97% on 2 liters.  HEENT: Anicteric sclerae; normal lids and conjunctiva; pupils equal, round,  react to light.  NECK:  Supple; normal carotid upstrokes without bruits; no JVD. Skin: No  significant lesions.  HEMATOPOIETIC:  No adenopathy.  ENDOCRINE:  No thyromegaly.  LUNGS:  Mild inspiratory and expiratory rhonchi; prolonged expiratory phase.  CARDIAC:  Normal first and second heart sounds; fourth heart sound present.  ABDOMEN:  Soft and nontender; no organomegaly; aortic pulsation not  palpable; normal bowel sounds without  bruits.  EXTREMITIES:  Normal distal pulses; no edema.  NEUROMUSCULAR:  Symmetric strength and tone; normal cranial nerves.  MUSCULOSKELETAL:  No joint deformities.  PSYCHIATRIC:  Alert and oriented; normal affect.   STUDIES:  EKG: Sinus bradycardia; left anterior fascicular block; otherwise  normal.   Other laboratories notable for normal CBC, normal chemistry profile and  normal cardiac markers.   IMPRESSION:  Kevin [name] presents with impressive cardiovascular risk  factors and somewhat worrisome symptoms. Some reassurances were provided by  an unremarkable physical examination, absence of acute changes on EKG and  negative cardiac markers. Although there is perhaps a 10% risk that his  symptoms are related to coronary disease, I do not believe it would be   worthwhile for him to remain in hospital for the next 60 hours to obtain a  stress test. He developed bradycardia in hospital after initial doses of  metoprolol. He will be discharged on a lower dose. He will be provided with  a prescription for nitroglycerin. Should he experience recurrent symptoms,  he will use nitroglycerin and called 9-1-1 immediately. Otherwise, a stress  Myoview study will be arranged for Monday morning. I will reassess him after  that study has been completed. The request for consultation is greatly  appreciated.      Fl[place] Bing, M.D. [facility]  Electronically Signed     RR/MEDQ  D:  03/07/2005  T:  03/08/2005  Job:  [id number]

## 2010-11-08 NOTE — Op Note (Signed)
NAME:  Kevin [name], Kevin [name]              ACCOUNT NO.:  [id number]   MEDICAL RECORD NO.:  [id number]          PATIENT TYPE:  AMB   LOCATION:  DAY                           FACILITY:  [facility]   PHYSICIAN:  R. Roetta Sessions, M.D. DATE OF BIRTH:  Aug 10, 1940   DATE OF PROCEDURE:  07/01/2004  DATE OF DISCHARGE:                                 OPERATIVE REPORT   PROCEDURE PERFORMED:  Colonoscopy with snare polypectomy.   INDICATIONS FOR PROCEDURE:  The patient is a 70 year old gentleman referred  courtesy of Edward L. Juanetta Gosling, M.D. for colorectal cancer screening.  He is  devoid of any lower GI tract symptoms.  There is no family history of  colorectal neoplasia.  He has never had colonoscopy.  Colonoscopy is now  being done.  This approach has been discussed with the patient at length.  Potential risks, benefits and alternatives have been reviewed, questions  answered and he is agreeable.  Please see documentation in medical records.   Oxygen saturations, blood pressure, pulse and respirations were monitored  throughout the entire procedure.   Conscious sedation was Versed 4 mg IV, Demerol 100 mg IV in divided doses.   INSTRUMENT USED:  Olympus video chip system.   FINDINGS:  Digital exam revealed no abnormalities.   ENDOSCOPIC FINDINGS:  Prep was adequate.   Rectum:  Examination of rectal mucosa including a retroflex view of the anal  verge revealed multiple 1 or 2 mm polyps and a 6 mm polyp on a stalk at 10  cm.  Otherwise rectal mucosa appeared normal.   Colon:  The colonic mucosa was surveyed from the rectosigmoid junction to  the left, transverse and right colon to the appendiceal orifice, ileocecal  valve and cecum.  From this level, the scope was slowly withdrawn. All  previously mentioned mucosal surfaces were again seen.  The patient had left-  sided diverticula.  The patient had two 1.25 cm polyps on stalks at the  hepatic flexure which were removed with a snare.  The patient had  two  similar polyps together at 35 cm and a couple of smaller 6 mm polyps the  same area which were all resected with snare.  Multiple polyp fragments were  recovered for the pathologist.  Pedunculated polyp of the rectum was  resected with snare.  The 1 and 2 mm small polyps were ablated with the tip  of the snare cautery and the patient tolerated the procedure well and was  reacted in endoscopy.   IMPRESSION:  1.  Multiple diminutive polyps in the rectum.  One pedunculated polyp at 10      cm removed with snare.  2.  Left-sided diverticulum, pedunculated polyps at 35 cm and the splenic      flexure removed with snare.  Remainder of the colonic mucosa appeared      normal.  3.  eft-sided diverticula.   RECOMMENDATIONS:  1.  No aspirin or arthritic medications for the next 10 days.  2.  Diverticulosis literature provided to Mr. Boyett.  3.  Further recommendations to follow.  4.  No Advil or arthritis  medications for 10 days.     Otelia Sergeant   RMR/MEDQ  D:  07/01/2004  T:  07/01/2004  Job:  [id number]

## 2010-11-08 NOTE — Discharge Summary (Signed)
NAME:  Kevin [name], Kevin [name]              ACCOUNT NO.:  [id number]   MEDICAL RECORD NO.:  [id number]          PATIENT TYPE:  OBV   LOCATION:  A201                          FACILITY:  [facility]   PHYSICIAN:  Edward L. Juanetta Gosling, M.D.DATE OF BIRTH:  07/07/1940   DATE OF ADMISSION:  03/06/2005  DATE OF DISCHARGE:  09/15/2006LH                                 DISCHARGE SUMMARY   FINAL DISCHARGE DIAGNOSES:  1.  Chest pain, workup underway.  2.  Hypertension.  3.  Hyperlipidemia.  4.  Chronic obstructive pulmonary disease.   HISTORY OF PRESENT ILLNESS:  Kevin [name] is a 70 year old with a long-known  history of cigarette smoking who was at a store discussing politics with a  neighbor when he started having substernal chest pain.  He described it as a  pressure or tightness in his chest which waxed and waned.  He said he  thought it was going away, so he went to his truck, drank a soft drink which  did not seem to make any difference, and it continued waxing and waning as  he was driving home and then got much worse.  He had mild diaphoresis, a  sensation of tingling in the right arm and came to my office and I sent him  immediately to the emergency room.  He was given aspirin, and his pain  resolved.   PHYSICAL EXAMINATION:  VITAL SIGNS:  Blood pressure 178/93, pulse 83,  respirations 16, temperature 98.2, O2 saturation 96% on room air.  CHEST:  Rhonchi bilaterally which was chronically present.  HEART:  Sounds were somewhat obscured, but he did not have murmur or gallop.  ABDOMEN:  Soft without masses.  EXTREMITIES:  Showed no edema.   LABORATORY DATA:  Electrolytes were normal.  Glucose 127.  White count  10,600, hemoglobin 15.9.  Point of care cardiac markers were normal.  EKG  did not show evidence of an acute MI.   HOSPITAL COURSE:  He was started on Lovenox and improved.  He had cardiology  consultation, and it was felt that he should undergo a graded exercise test,  but since he was in the  hospital and he could not get this done for about 60  hours, it was felt that he should be discharged on Metoprolol 25 mg daily,  aspirin daily, nitroglycerin p.r.n. and then return in about 60 hours for a  stress test.  He is to follow up in my office after the stress test.      Ramon Dredge L. Juanetta Gosling, M.D.  Electronically Signed     ELH/MEDQ  D:  03/17/2005  T:  03/17/2005  Job:  [id number]

## 2010-11-08 NOTE — Procedures (Signed)
NAMEDOHN, STCLAIR              ACCOUNT NO.:  [id number]   MEDICAL RECORD NO.:  [id number]          PATIENT TYPE:  INP   LOCATION:  A201                          FACILITY:  [facility]   PHYSICIAN:  [place] Bing, M.D. LHCDATE OF BIRTH:  [birth year]   DATE OF PROCEDURE:  03/08/2005  DATE OF DISCHARGE:  03/07/2005                                  ECHOCARDIOGRAM   REFERRING:  Dr. Juanetta Gosling.   CLINICAL DATA:  A 70 year old gentleman with hypertension and COPD admitted  with chest pain.   M-MODE:  Aorta 3.2, left atrium 3.8, septum 1.6, posterior wall 1.5, LV  diastole 4.4, LV systole 3.2.   1.  Technically adequate echocardiographic study.  2.  Normal left atrium, right atrium and right ventricle.  3.  Mild aortic valvular sclerosis; mild calcification of the proximal      ascending aorta.  4.  Normal mitral valve; mild annular calcification.  5.  Normal tricuspid valve; trivial regurgitation.  6.  The pulmonic valve not well seen - probably normal; normal proximal      pulmonary artery.  7.  Normal left ventricular size; mild concentric hypertrophy; normal      regional and global left ventricular systolic function.  8.  IVC at the upper limit of normal in diameter; size decreases normally      with inspiration.      [place] Bing, M.D. [facility]  Electronically Signed     RR/MEDQ  D:  03/08/2005  T:  03/08/2005  Job:  [id number]

## 2010-11-08 NOTE — Group Therapy Note (Signed)
NAME:  Kevin [name], Kevin [name]              ACCOUNT NO.:  [id number]   MEDICAL RECORD NO.:  [id number]          PATIENT TYPE:  INP   LOCATION:  A201                          FACILITY:  [facility]   PHYSICIAN:  Edward L. Juanetta Gosling, M.D.DATE OF BIRTH:  14-Aug-1940   DATE OF PROCEDURE:  03/07/2005  DATE OF DISCHARGE:                                   PROGRESS NOTE   SUBJECTIVE:  [name] was admitted yesterday with chest pain.  He says he  has not had any more chest pain since I saw him yesterday evening.  He slept  fairly well and is feeling alright.   OBJECTIVE:  VITAL SIGNS:  His temperature is 98.2, pulse 59, respirations  20, blood pressure 134/76, O2 saturations 97% on 2 L.   He has had one negative cardiac enzymes set.  He had negative point of care  cardiac enzymes in the emergency room.  Another set should be done within  the next hour.   ASSESSMENT/PLAN:  I have asked for cardiology consultation with the [facility]  team and hopefully either he can get some sort of a workup today, although  apparently there is a full schedule and in the stress lab, or if he needs a  cardiac catheterization be set up for that, or perhaps he could do this as  an outpatient.      Edward L. Juanetta Gosling, M.D.  Electronically Signed     ELH/MEDQ  D:  03/07/2005  T:  03/07/2005  Job:  [id number]

## 2010-12-12 ENCOUNTER — Encounter: Payer: Self-pay | Admitting: *Deleted

## 2010-12-16 ENCOUNTER — Other Ambulatory Visit (INDEPENDENT_AMBULATORY_CARE_PROVIDER_SITE_OTHER): Payer: Medicare Other | Admitting: *Deleted

## 2010-12-16 ENCOUNTER — Ambulatory Visit (INDEPENDENT_AMBULATORY_CARE_PROVIDER_SITE_OTHER): Payer: Medicare Other | Admitting: Nurse Practitioner

## 2010-12-16 ENCOUNTER — Encounter: Payer: Self-pay | Admitting: Nurse Practitioner

## 2010-12-16 VITALS — BP 164/84 | HR 60 | Ht 70.0 in | Wt 217.0 lb

## 2010-12-16 DIAGNOSIS — F17200 Nicotine dependence, unspecified, uncomplicated: Secondary | ICD-10-CM

## 2010-12-16 DIAGNOSIS — E785 Hyperlipidemia, unspecified: Secondary | ICD-10-CM

## 2010-12-16 DIAGNOSIS — I10 Essential (primary) hypertension: Secondary | ICD-10-CM

## 2010-12-16 DIAGNOSIS — E7800 Pure hypercholesterolemia, unspecified: Secondary | ICD-10-CM

## 2010-12-16 LAB — BASIC METABOLIC PANEL
BUN: 18 mg/dL (ref 6–23)
CO2: 28 mEq/L (ref 19–32)
Calcium: 9.1 mg/dL (ref 8.4–10.5)
Chloride: 109 mEq/L (ref 96–112)
Creatinine, Ser: 1 mg/dL (ref 0.4–1.5)
GFR: 78.53 mL/min (ref >60.00)
Glucose, Bld: 121 mg/dL — ABNORMAL HIGH (ref 70–99)
Potassium: 5.2 mEq/L — ABNORMAL HIGH (ref 3.5–5.1)
Sodium: 143 mEq/L (ref 135–145)

## 2010-12-16 LAB — LIPID PANEL
Cholesterol: 138 mg/dL (ref 0–200)
HDL: 46.1 mg/dL (ref >39.00)
LDL Cholesterol: 81 mg/dL (ref 0–99)
Total CHOL/HDL Ratio: 3
Triglycerides: 53 mg/dL (ref 0.0–149.0)
VLDL: 10.6 mg/dL (ref 0.0–40.0)

## 2010-12-16 LAB — HEPATIC FUNCTION PANEL
ALT: 18 U/L (ref 0–53)
AST: 16 U/L (ref 0–37)
Albumin: 4.1 g/dL (ref 3.5–5.2)
Alkaline Phosphatase: 80 U/L (ref 39–117)
Bilirubin, Direct: 0.1 mg/dL (ref 0.0–0.3)
Total Bilirubin: 0.4 mg/dL (ref 0.3–1.2)
Total Protein: 6.7 g/dL (ref 6.0–8.3)

## 2010-12-16 NOTE — Progress Notes (Signed)
Kevin Frank Date of Birth: 1941-01-01   History of Present Illness: Kevin Frank is seen today for a follow up visit. It is a 6 month visit. He is seen for Dr. Excell Seltzer. He is a former patient of Dr. Ronnald Nian. He is doing ok. No chest pain. His breathing is at baseline. He continues to smoke. His wife has stopped with the help of Chantix. He is not ready to stop. Blood pressure at home is good. He has not had his medicines yet today due to fasting labs. He remains active on his farm. He continues to have issues with BPH.   Current Outpatient Prescriptions on File Prior to Visit  Medication Sig Dispense Refill  . allopurinol (ZYLOPRIM) 300 MG tablet Take 300 mg by mouth daily.        [name] amLODipine (NORVASC) 10 MG tablet Take 10 mg by mouth daily.        . furosemide (LASIX) 40 MG tablet Take 40 mg by mouth daily.        [name] lisinopril (PRINIVIL,ZESTRIL) 5 MG tablet Take 5 mg by mouth daily.        . metFORMIN (GLUCOPHAGE) 500 MG tablet Take 500 mg by mouth 2 (two) times daily with a meal.        . metoprolol succinate (TOPROL-XL) 25 MG 24 hr tablet Take 25 mg by mouth daily.        [name] oxybutynin (DITROPAN-XL) 5 MG 24 hr tablet Take 5 mg by mouth daily.        . sertraline (ZOLOFT) 50 MG tablet Take 50 mg by mouth daily.        . simvastatin (ZOCOR) 80 MG tablet Take 80 mg by mouth at bedtime.        [name] SPIRIVA HANDIHALER 18 MCG inhalation capsule Place 18 mcg into inhaler and inhale as needed.       . Tamsulosin HCl (FLOMAX) 0.4 MG CAPS Take 0.4 mg by mouth daily.          No Known Allergies  Past Medical History  Diagnosis Date  . Hyperlipidemia   . Hypertension   . AAA (abdominal aortic aneurysm) 01/2008    3.2cm  . Diabetes mellitus   . Obesity   . Hypercholesterolemia   . Shortness of breath     due to cigarette abuse  . Obstructive sleep apnea on CPAP     Past Surgical History  Procedure Date  . Cholecystectomy 1997    History  Smoking status  . Current Everyday Smoker  -- 2.0 packs/day  . Types: Cigarettes  Smokeless tobacco  . Not on file    History  Alcohol Use No    Family History  Problem Relation Age of Onset  . Liver disease Father   . Lymphoma Mother   . Mental retardation      sibling -- from birth     Review of Systems: The review of systems is positive for BPH. He does bruise easily. No chest pain.  He has had his aneurysm checked recently per Dr. Craige Cotta. It remains less than 4 cm. All other systems were reviewed and are negative.  Physical Exam: BP 164/84  Pulse 60  Ht 5\' 10"  (1.778 m)  Wt 217 lb (98.431 kg)  BMI 31.14 kg/m2 Patient is pleasant and in no acute distress. Skin is warm and dry. Color is normal and he is suntanned.  HEENT is unremarkable. Normocephalic/atraumatic. PERRL. Sclera are nonicteric. Neck is supple. No  masses. No JVD. Lungs are clear with a prolonged expiration. Cardiac exam shows a regular rate and rhythm. Abdomen is obese and soft. Extremities are without edema. Gait and ROM are intact. No gross neurologic deficits noted.  LABORATORY DATA: Pending. EKG today shows sinus bradycardia.   Assessment / Plan:

## 2010-12-16 NOTE — Patient Instructions (Addendum)
Please consider smoking cessation Continue with your current medicines. Monitor your blood pressure at home.  Record your readings and bring to your next visit. Limit sodium intake. Call for any problems.  You will see Dr. Calton Dach in 6 months

## 2010-12-16 NOTE — Assessment & Plan Note (Signed)
Smoking cessation is encouraged once again.

## 2010-12-16 NOTE — Assessment & Plan Note (Signed)
Blood pressure is controlled at home. I have left him on his current regimen. He will continue with home monitoring. I will have him see Dr. Excell Seltzer in 6 months. He is up to date with his stress testing. Patient is agreeable to this plan and will call if any problems develop in the interim.

## 2010-12-16 NOTE — Assessment & Plan Note (Signed)
Labs are checked today.  

## 2010-12-17 ENCOUNTER — Telehealth: Payer: Self-pay | Admitting: *Deleted

## 2010-12-17 ENCOUNTER — Encounter: Payer: Self-pay | Admitting: Nurse Practitioner

## 2010-12-17 NOTE — Telephone Encounter (Signed)
Message copied by Adolphus Birchwood on Tue Dec 17, 2010  8:39 AM ------      Message from: Rosalio Macadamia      Created: Mon Dec 16, 2010  3:01 PM       Ok to report. Labs are satisfactory.  Stay on same medicines. Recheck BMET/HPF/LIPIDS  in 6 months.

## 2010-12-17 NOTE — Telephone Encounter (Signed)
Left message for pt on cell with lab results and to continue same medications.  Pt instructed to have labs in six months.

## 2010-12-18 ENCOUNTER — Other Ambulatory Visit (HOSPITAL_COMMUNITY): Payer: Self-pay | Admitting: Urology

## 2010-12-18 DIAGNOSIS — N400 Enlarged prostate without lower urinary tract symptoms: Secondary | ICD-10-CM

## 2010-12-23 ENCOUNTER — Telehealth: Payer: Self-pay | Admitting: Cardiology

## 2010-12-23 ENCOUNTER — Ambulatory Visit (HOSPITAL_COMMUNITY)
Admission: RE | Admit: 2010-12-23 | Discharge: 2010-12-23 | Disposition: A | Discharge status: Home / Self Care | Payer: Medicare Other | Source: Ambulatory Visit | Attending: Urology | Admitting: Urology

## 2010-12-23 DIAGNOSIS — N1330 Unspecified hydronephrosis: Secondary | ICD-10-CM | POA: Insufficient documentation

## 2010-12-23 DIAGNOSIS — Q619 Cystic kidney disease, unspecified: Secondary | ICD-10-CM | POA: Insufficient documentation

## 2010-12-23 DIAGNOSIS — E785 Hyperlipidemia, unspecified: Secondary | ICD-10-CM | POA: Insufficient documentation

## 2010-12-23 DIAGNOSIS — R3916 Straining to void: Secondary | ICD-10-CM | POA: Insufficient documentation

## 2010-12-23 DIAGNOSIS — E119 Type 2 diabetes mellitus without complications: Secondary | ICD-10-CM | POA: Insufficient documentation

## 2010-12-23 DIAGNOSIS — E7800 Pure hypercholesterolemia, unspecified: Secondary | ICD-10-CM | POA: Insufficient documentation

## 2010-12-23 DIAGNOSIS — I10 Essential (primary) hypertension: Secondary | ICD-10-CM | POA: Insufficient documentation

## 2010-12-23 DIAGNOSIS — N400 Enlarged prostate without lower urinary tract symptoms: Secondary | ICD-10-CM | POA: Insufficient documentation

## 2010-12-23 DIAGNOSIS — R9389 Abnormal findings on diagnostic imaging of other specified body structures: Secondary | ICD-10-CM | POA: Insufficient documentation

## 2010-12-23 NOTE — Telephone Encounter (Signed)
Called in wanting the results of his blood work read to him from his latest labs two weeks ago. Please call back.

## 2010-12-24 NOTE — Telephone Encounter (Signed)
Called pt back to report labs from 12/16/10. Verbalizes understanding to stay on same meds and have labs repeated in six months. Pt informed that labs due the same time he is to see Dr Excell Seltzer. Pt will schedule labs when he schedules ov.

## 2011-01-06 ENCOUNTER — Encounter (HOSPITAL_COMMUNITY): Payer: Self-pay

## 2011-01-06 ENCOUNTER — Encounter (HOSPITAL_COMMUNITY)
Admission: RE | Admit: 2011-01-06 | Discharge: 2011-01-06 | Disposition: A | Discharge status: Home / Self Care | Payer: Medicare Other | Source: Ambulatory Visit | Attending: Urology | Admitting: Urology

## 2011-01-06 HISTORY — DX: Presence of urogenital implants: Z96.0

## 2011-01-06 HISTORY — DX: Benign prostatic hyperplasia without lower urinary tract symptoms: N40.0

## 2011-01-06 LAB — BASIC METABOLIC PANEL
BUN: 15 mg/dL (ref 6–23)
CO2: 27 mEq/L (ref 19–32)
Calcium: 9.4 mg/dL (ref 8.4–10.5)
Chloride: 103 mEq/L (ref 96–112)
Creatinine, Ser: 0.55 mg/dL (ref 0.50–1.35)

## 2011-01-06 LAB — CBC
HCT: 41.9 % (ref 39.0–52.0)
MCHC: 35.1 g/dL (ref 30.0–36.0)
MCV: 94.8 fL (ref 78.0–100.0)
Platelets: 153 10*3/uL (ref 150–400)
RDW: 13.7 % (ref 11.5–15.5)

## 2011-01-06 NOTE — Patient Instructions (Addendum)
20 Kevin Frank  01/06/2011   Your procedure is scheduled on:  Tuesday, 01/07/11  Report to [facility] at 0830 AM.  Call this number if you have problems the morning of surgery: [phone]   Remember:   Do not eat food:After Midnight.  Do not drink clear liquids: After Midnight.  Take these medicines the morning of surgery with A SIP OF WATER: amlodipine, toprol, lisinopril, sertraline, and spiriva. Bring inhaler with you.   Do not wear jewelry, make-up or nail polish.  Do not bring valuables to the hospital.  Contacts, dentures or bridgework may not be worn into surgery.     Patients discharged the day of surgery will not be allowed to drive home.  Name and phone number of your driver: wife, Okey Regal  Special Instructions: CHG Shower Shower 2 days before surgery and 1 day before surgery with Hibiclens.   Please read over the following fact sheets that you were given: Pain Booklet, MRSA Information, Surgical Site Infection Prevention and Anesthesia Post-op Instructions    PATIENT INSTRUCTIONS POST-ANESTHESIA  IMMEDIATELY FOLLOWING SURGERY:  Do not drive or operate machinery for the first twenty four hours after surgery.  Do not make any important decisions for twenty four hours after surgery or while taking narcotic pain medications or sedatives.  If you develop intractable nausea and vomiting or a severe headache please notify your doctor immediately.  FOLLOW-UP:  Please make an appointment with your surgeon as instructed. You do not need to follow up with anesthesia unless specifically instructed to do so.  WOUND CARE INSTRUCTIONS (if applicable):  Keep a dry clean dressing on the anesthesia/puncture wound site if there is drainage.  Once the wound has quit draining you may leave it open to air.  Generally you should leave the bandage intact for twenty four hours unless there is drainage.  If the epidural site drains for more than 36-48 hours please call the anesthesia  department.  QUESTIONS?:  Please feel free to call your physician or the hospital operator if you have any questions, and they will be happy to assist you.     [facility] Anesthesia Department [address] [place] [phone]    Transurethral Resection of the Prostate (TURP) Care After Refer to this sheet in the next few weeks. These discharge instructions provide you with general information on caring for yourself after you leave the hospital. Your caregiver may also give you specific instructions. Your treatment has been planned according to the most current medical practices available, but unavoidable complications sometimes occur. If you have any problems or questions after discharge, please call your caregiver. HOME CARE INSTRUCTIONS Medications  You will receive medicine for pain management. As your level of discomfort decreases, adjustments in your pain medicines may be made.   Since it is important that you do deep breathing and coughing exercises and increase your activity, it may be helpful to take pain medicines prior to these activities. Take all medicines as directed.   You may be given a medicine (antibiotic) to kill germs following surgery. Finish all medicines. Let your caregiver know if you have any side effects or problems from the medicine.  Hygiene  You can take a shower after surgery.   You should not take a bath while you still have the urethral catheter.  Activity  You will be encouraged to get out of bed as much as possible and increase your activity level as tolerated.   Spend the first week in  and around your home. For 10 days, avoid the following:   Straining.  Running.   Strenuous work.   Walks longer than a couple blocks.  Riding for extended periods.   Sexual relations.    Do not lift heavy objects (more than 5 pounds/2.25 kilograms) for at least 1 month. When lifting, use your arms instead of your abdominal muscles.   You  will be encouraged to walk as tolerated. Do not exert yourself. Increase your activity level slowly. Remember that it is important to keep moving after an operation of any type. This cuts down on the possibility of developing blood clots.   Your caregiver will tell you when you can resume driving and light housework. Discuss this at your first office visit after discharge.  Diet  No special diet is ordered after a TURP. However, if you are on a special diet for another medical problem, it should be continued.   Normal fluid intake is usually recommended.   Avoid alcohol and caffeinated drinks for 2 weeks. They irritate the bladder. Decaffeinated drinks are okay.   Avoid spicy foods.  Bladder Function  For the first 10 days, empty the bladder whenever you feel a definite desire. Do not try to hold the urine for long periods of time.   Urinating once or twice a night even after you are healed is not uncommon.   You may see some recurrence of blood in the urine after discharge from the hospital. If this occurs, force fluids again as you did in the hospital and reduce your activity.  Bowel Function  You may experience some constipation after surgery. This can be minimized by increasing fluids and fiber in your diet. Drink enough water and fluids to keep your urine clear or pale yellow.   A stool softener may be prescribed for use at home. Do not strain to move your bowels.   If you are requiring increased pain medicine, it is important that you take stool softeners to prevent constipation. This will help to promote proper healing by reducing the need to strain to move your bowels.  Sexual Activity  Semen movement in the opposite direction and into the bladder (retrograde ejaculation) may occur. Since the semen passes into the bladder, cloudy urine can occur the first time you urinate after intercourse. Or, you may not have an ejaculation during erection. Ask your caregiver when you can resume  sexual activity. Retrograde ejaculation and reduced semen discharge should not reduce one's pleasure of intercourse.  Postoperative Visit  Arrange the date and time of your after surgery visit with your caregiver.  Return to Work  After your recovery is complete, you will be able to return to work and resume all activities. Your caregiver will inform you when you can return to work.  Foley Catheter Care A soft, flexible tube (Foley catheter) has been placed in your bladder to drain urine and fluid. Follow these instructions: Taking Care of the Catheter  Keep the area where the catheter leaves your body clean.   Attach the catheter to the leg so there is no tension on the catheter.   Keep the drainage bag below the level of the bladder, but keep it OFF the floor.   Do not take long soaking baths. Your caregiver will give instructions about showering.   Wash your hands before touching ANYTHING related to the catheter or bag.   Using mild soap and warm water on a washcloth:   Clean the area closest to  the catheter insertion site using a circular motion around the catheter.   Clean the catheter itself by wiping AWAY from the insertion site for several inches down the tube.   NEVER wipe upward as this could sweep bacteria up into the urethra (tube in your body that normally drains the bladder) and cause infection.   Place a small amount of sterile lubricant at the tip of the penis where the catheter is entering.  Taking Care of the Drainage Bags  Two drainage bags will be taken home: a large overnight drainage bag, and a smaller leg bag which fits underneath clothing.   It is okay to wear the overnight bag at any time, but NEVER wear the smaller leg bag at night.   Keep the drainage bag well below the level of your bladder. This prevents backflow of urine into the bladder and allows the urine to drain freely.   Anchor the tubing to your leg to prevent pulling or tension on the  catheter. Use tape or a leg strap provided by the hospital.   Empty the drainage bag when it is 1/2 to 3/4 full. Wash your hands before and after touching the bag.   Periodically check the tubing for kinks to make sure there is no pressure on the tubing which could restrict the flow of urine.  Changing the Drainage Bags  Cleanse both ends of the clean bag with alcohol before changing.   Pinch off the rubber catheter to avoid urine spillage during the disconnection.   Disconnect the dirty bag and connect the clean one.   Empty the dirty bag carefully to avoid a urine spill.   Attach the new bag to the leg with tape or a leg strap.  Cleaning the Drainage Bags  Whenever a drainage bag is disconnected, it must be cleaned quickly so it is ready for the next use.   Wash the bag in warm, soapy water.   Rinse the bag thoroughly with warm water.   Soak the bag for 30 minutes in a solution of white vinegar and water (1 cup vinegar to 1 quart warm water).   Rinse with warm water.  SEEK MEDICAL CARE IF:  You have chills or night sweats.   You have an oral temperature above 101.   You are leaking around your catheter or have problems with your catheter. It is not uncommon to have sporadic leakage around your catheter as a result of bladder spasms. If the leakage stops, there is not much need for concern. If you are uncertain, call your caregiver.   You develop side effects that you think are coming from your medicines.  SEEK IMMEDIATE MEDICAL CARE IF:  You are suddenly unable to urinate. Check to see if there are any kinks in the drainage tubing that may cause this. If you cannot find any kinks, call your caregiver immediately. This is an emergency.   You develop shortness of breath or chest pains.   Bleeding persists or clots develop in your urine.   You have an oral temperature above 101, not controlled by medicine.   You develop pain in your back or over your lower belly  (abdomen).   You develop pain or swelling in your legs.   Any problems you are having get worse rather than better.  MAKE SURE YOU:  Understand these instructions.   Will watch your condition.   Will get help right away if you are not doing well or get worse.  Document  Released: 06/09/2005 Document Re-Released: 11/27/2009 [employer] Patient Information 2011 [employer].TURP, Transurethral Resection of the Prostate Transurethral Resection of the Prostate (TURP) is often treatment for non-cancerous (benign) prostatic hyperplasia (BPH) or prostate cancer. BPH commonly begins in middle aged men and can cause symptoms at any age thereafter. The complications that stem from these problems, such as recurrent infection and bladder control and emptying problems, are often helped by this procedure.  Both conditions usually cause the prostate to increase in size. TURP is a major surgery that removes part of the prostate gland. The goal is to remove enough prostate to allow for unobstructed flow of urine. BEFORE THE PROCEDURE  You may be asked to temporarily adjust your diet. If so, your caregiver will give you specific recommendations.   If you are on blood thinners, stop taking them before the operation, or as your caregiver advises.   You should have nothing to eat or drink after midnight prior to your surgery or as suggested by your caregiver. You may have a sip of water to take medications not stopped for the procedure  LET YOUR CAREGIVERS KNOW ABOUT:  Allergies.  Medications taken including herbs, eye drops, over the counter medications, and creams.   Use of steroids (by mouth or creams).   Previous problems with anesthetics or Novocaine.   History of blood clots (thrombophlebitis).   History of bleeding or blood problems.  Previous surgery.   Previous prostate infections.   Other health problems.   PROCEDURE This operation is performed after you have been given a medication to  help you sleep (anesthetic), or with a spinal block. The spinal block keeps you awake but numb from the waist down.  No cut or incision is needed. During the operation, your surgeon passes a viewing and cutting instrument (resectoscope) through the penis into the prostate gland. The instrument contains an electric cutting edge. From inside the prostate, this cutter is used to remove part of the prostate.  RISKS & COMPLICATIONS OF THE PROCEDURE  Rare injury to the bowel (intestine).   Rare injury to the bladder or adjacent blood vessels.   Intestinal/bowel obstruction.   Scarring called "stricture" that cause later problems with the flow of urine.   Bleeding and the need for blood transfusion (more common in those with prostate cancer and those who have previously received radiation therapy).   Inability to control your urine (incontinence). This is more common in those with prostate cancer and those who have received radiation therapy.   Injury to one of the ureters (tubes that drain the kidneys into the bladder) and/or urethra (the tube that drains the bladder).   Injury to the capsule that holds the prostate. This can lead to leakage of fluid and urine into the belly (abdomen).   The operation can possibly lead to impotence. This is the inability to get an erection. Treatments are available for these types of problems.   Rare over absorption of the fluids used during the operation. This can then cause low blood levels of sodium, brain swelling, strain on the heart, and fluid accumulation in the lungs. This is more common in long procedures.   Infection.   Blood clots in the legs.   As with any major surgery, there is always the rare chance of a complicating stroke, heart attack, or other complications that will be discussed with you by the surgeon and anesthesiologist.  TREATMENT (TURP & TUIP) This surgery (TURP procedure) is done using an instrument like a narrow  telescope to look  into your bladder. This is then used to remove enlarged pieces of your prostate, one piece at a time. This removes the blockage and makes it easier for you to urinate. This is called a transurethral resection of the prostate. A lesser procedure is sometimes done. In this procedure, small cuts are made in the prostate. This lessens the prostates pressure on the urethra. This is called a transurethral incision (cut by the surgeon) of the prostate (TUIP). You will probably be comfortable soon after the operation, but it will take 10 to 12 weeks, or longer, for your prostate to heal after you leave the hospital.  HOME CARE INSTRUCTIONS For your own protection, observe the following precautions for 10 days after your operation.  You may go home with a catheter. Take care of it as directed. You will receive instruction on catheter care.   After catheter removal, empty the bladder whenever you feel a definite desire. Do not try to hold the urine for long periods of time.   For 10 days, avoid all lifting, straining, running, strenuous work, walks longer than a couple blocks, riding in a car for extended periods, and sexual relations.   Take 2 tablespoons of heavy mineral oil or Metamucil night and morning for 3 or 4 days. After that, gradually reduce the dose to one or two teaspoons twice daily. Stop it after the stools have been normal for a week. If you become constipated, do not strain to move your bowels. You may use an enema. Notify your caregiver about problems.   Even after complete healing, you may continue to urinate once or twice during the night.   In addition to your usual medications, you may be given an antibiotic to take for 10-14 days. Notify your caregiver if you have any side effects or problems with the medication.   Avoid alcohol and caffeinated drinks for 2 weeks, as they are irritating to the bladder. Decaffeinated drinks are fine.   Eat a regular diet, avoiding spicy foods for 2  weeks.   You may continue non-strenuous activities. It is always important to keep active after an operation. This lessens the chance of developing blood clots.   You may see some recurrence of blood in the urine after discharge from the hospital. Even a small amount of blood colors the urine very red. If this occurs, force fluids again as you did in the hospital. This is generally not a concern.  SEEK MEDICAL CARE IF:  You have chills, fever, night sweats or an unexplained oral temperature above 101.   You are leaking around your catheter or have problems with your catheter.   You develop side effects that you think are coming from your medications.  SEEK IMMEDIATE MEDICAL CARE IF:  You are suddenly unable to urinate. This is an emergency.   You develop shortness of breath or chest pains.   Bleeding persists or clots develop.   An unexplained oral temperature above 101 develops.   You develop pain in your back or over your lower belly (abdomen).   You develop pain or swelling in your legs.   You develop swelling in your abdomen or have a sudden weight gain.   The problems get worse rather than better.  Document Released: 06/09/2005 Document Re-Released: 11/27/2009 [facility] Patient Information 2011 [place], [place].

## 2011-01-07 ENCOUNTER — Encounter (HOSPITAL_COMMUNITY): Payer: Self-pay | Admitting: Anesthesiology

## 2011-01-07 ENCOUNTER — Inpatient Hospital Stay (HOSPITAL_COMMUNITY): Payer: Medicare Other | Admitting: Anesthesiology

## 2011-01-07 ENCOUNTER — Ambulatory Visit (HOSPITAL_COMMUNITY)
Admission: RE | Admit: 2011-01-07 | Discharge: 2011-01-08 | Disposition: A | Discharge status: Home / Self Care | Payer: Medicare Other | Source: Ambulatory Visit | Attending: Urology | Admitting: Urology

## 2011-01-07 ENCOUNTER — Other Ambulatory Visit: Payer: Self-pay | Admitting: Urology

## 2011-01-07 ENCOUNTER — Encounter (HOSPITAL_COMMUNITY): Payer: Self-pay | Admitting: *Deleted

## 2011-01-07 ENCOUNTER — Encounter (HOSPITAL_COMMUNITY)
Admission: RE | Disposition: A | Discharge status: Home / Self Care | Payer: Self-pay | Source: Ambulatory Visit | Attending: Urology

## 2011-01-07 DIAGNOSIS — Z87898 Personal history of other specified conditions: Secondary | ICD-10-CM

## 2011-01-07 DIAGNOSIS — N320 Bladder-neck obstruction: Secondary | ICD-10-CM | POA: Insufficient documentation

## 2011-01-07 DIAGNOSIS — Z23 Encounter for immunization: Secondary | ICD-10-CM | POA: Insufficient documentation

## 2011-01-07 DIAGNOSIS — I10 Essential (primary) hypertension: Secondary | ICD-10-CM | POA: Insufficient documentation

## 2011-01-07 DIAGNOSIS — N401 Enlarged prostate with lower urinary tract symptoms: Principal | ICD-10-CM | POA: Insufficient documentation

## 2011-01-07 DIAGNOSIS — Z79899 Other long term (current) drug therapy: Secondary | ICD-10-CM | POA: Insufficient documentation

## 2011-01-07 DIAGNOSIS — N138 Other obstructive and reflux uropathy: Principal | ICD-10-CM | POA: Insufficient documentation

## 2011-01-07 DIAGNOSIS — E785 Hyperlipidemia, unspecified: Secondary | ICD-10-CM | POA: Insufficient documentation

## 2011-01-07 DIAGNOSIS — Z01812 Encounter for preprocedural laboratory examination: Secondary | ICD-10-CM | POA: Insufficient documentation

## 2011-01-07 HISTORY — PX: TRANSURETHRAL RESECTION OF PROSTATE: SHX73

## 2011-01-07 LAB — GLUCOSE, CAPILLARY

## 2011-01-07 SURGERY — TURP (TRANSURETHRAL RESECTION OF PROSTATE)
Anesthesia: Spinal | Site: Penis | Wound class: Clean Contaminated

## 2011-01-07 MED ORDER — PROPOFOL 10 MG/ML IV EMUL
INTRAVENOUS | Status: AC
  Filled 2011-01-07: qty 20

## 2011-01-07 MED ORDER — MIDAZOLAM HCL 2 MG/2ML IJ SOLN
INTRAMUSCULAR | Status: AC
  Filled 2011-01-07: qty 2

## 2011-01-07 MED ORDER — GENTAMICIN IN SALINE 1.6-0.9 MG/ML-% IV SOLN: 80.0000 mg | Freq: Once | INTRAVENOUS | Status: DC

## 2011-01-07 MED ORDER — GENTAMICIN IN SALINE 1.6-0.9 MG/ML-% IV SOLN
INTRAVENOUS | Status: AC
  Filled 2011-01-07: qty 50

## 2011-01-07 MED ORDER — FENTANYL CITRATE 0.05 MG/ML IJ SOLN
INTRAMUSCULAR | Status: DC | PRN
  Administered 2011-01-07: 25 ug via INTRATHECAL

## 2011-01-07 MED ORDER — LACTATED RINGERS IV SOLN
INTRAVENOUS | Status: DC | PRN
  Administered 2011-01-07: 10:00:00 via INTRAVENOUS

## 2011-01-07 MED ORDER — GLYCOPYRROLATE 0.2 MG/ML IJ SOLN
INTRAMUSCULAR | Status: AC
  Filled 2011-01-07: qty 1

## 2011-01-07 MED ORDER — GLYCOPYRROLATE 0.2 MG/ML IJ SOLN
0.2000 mg | Freq: Once | INTRAMUSCULAR | Status: AC
  Administered 2011-01-07: 0.2 mg via INTRAVENOUS

## 2011-01-07 MED ORDER — MIDAZOLAM HCL 2 MG/2ML IJ SOLN
1.0000 mg | INTRAMUSCULAR | Status: DC | PRN
Start: 2011-01-07 — End: 2011-01-07
  Administered 2011-01-07: 2 mg via INTRAVENOUS

## 2011-01-07 MED ORDER — ONDANSETRON HCL 4 MG/2ML IJ SOLN: 4.0000 mg | Freq: Once | INTRAMUSCULAR | Status: DC | PRN

## 2011-01-07 MED ORDER — GENTAMICIN IN SALINE 1.6-0.9 MG/ML-% IV SOLN
INTRAVENOUS | Status: DC | PRN
  Administered 2011-01-07: 80 mg via INTRAVENOUS

## 2011-01-07 MED ORDER — FENTANYL CITRATE 0.05 MG/ML IJ SOLN: 25.0000 ug | INTRAMUSCULAR | Status: DC | PRN

## 2011-01-07 MED ORDER — HYDROCODONE-ACETAMINOPHEN 5-325 MG PO TABS: 1.0000 | ORAL_TABLET | ORAL | Status: DC | PRN

## 2011-01-07 MED ORDER — PROPOFOL 10 MG/ML IV EMUL
INTRAVENOUS | Status: DC | PRN
  Administered 2011-01-07: 25 ug/kg/min via INTRAVENOUS

## 2011-01-07 MED ORDER — MIDAZOLAM HCL 5 MG/5ML IJ SOLN
INTRAMUSCULAR | Status: DC | PRN
  Administered 2011-01-07 (×2): 1 mg via INTRAVENOUS

## 2011-01-07 MED ORDER — PNEUMOCOCCAL VAC POLYVALENT 25 MCG/0.5ML IJ INJ
0.5000 mL | INJECTION | Freq: Once | INTRAMUSCULAR | Status: AC
  Administered 2011-01-08: 0.5 mL via INTRAMUSCULAR
  Filled 2011-01-07: qty 0.5

## 2011-01-07 MED ORDER — DEXTROSE-NACL 5-0.45 % IV SOLN
INTRAVENOUS | Status: DC
  Administered 2011-01-07 – 2011-01-08 (×2): via INTRAVENOUS
  Filled 2011-01-07 (×5): qty 1000

## 2011-01-07 MED ORDER — LACTATED RINGERS IV SOLN
INTRAVENOUS | Status: DC
  Administered 2011-01-07: 10:00:00 via INTRAVENOUS

## 2011-01-07 MED ORDER — CIPROFLOXACIN IN D5W 200 MG/100ML IV SOLN
200.0000 mg | Freq: Two times a day (BID) | INTRAVENOUS | Status: DC
  Administered 2011-01-07 – 2011-01-08 (×2): 200 mg via INTRAVENOUS
  Filled 2011-01-07 (×4): qty 100

## 2011-01-07 MED ORDER — FENTANYL CITRATE 0.05 MG/ML IJ SOLN
INTRAMUSCULAR | Status: AC
  Filled 2011-01-07: qty 2

## 2011-01-07 MED ORDER — EPHEDRINE SULFATE 50 MG/ML IJ SOLN
INTRAMUSCULAR | Status: DC | PRN
  Administered 2011-01-07: 10 mg via INTRAVENOUS

## 2011-01-07 MED ORDER — BUPIVACAINE HCL 0.75 % IJ SOLN
INTRAMUSCULAR | Status: DC | PRN
  Administered 2011-01-07: 15 mg

## 2011-01-07 MED ORDER — GENTAMICIN IN SALINE 1.6-0.9 MG/ML-% IV SOLN
80.0000 mg | Freq: Once | INTRAVENOUS | Status: AC
  Administered 2011-01-07: 80 mg via INTRAVENOUS

## 2011-01-07 MED ORDER — FENTANYL CITRATE 0.05 MG/ML IJ SOLN
INTRAMUSCULAR | Status: DC | PRN
  Administered 2011-01-07 (×2): 25 ug via INTRAVENOUS

## 2011-01-07 MED ORDER — CIPROFLOXACIN IN D5W 200 MG/100ML IV SOLN: 200.0000 mg | Freq: Two times a day (BID) | INTRAVENOUS | Status: DC

## 2011-01-07 MED ORDER — EPHEDRINE SULFATE 50 MG/ML IJ SOLN
INTRAMUSCULAR | Status: AC
  Filled 2011-01-07: qty 1

## 2011-01-07 MED ORDER — BUPIVACAINE IN DEXTROSE 0.75-8.25 % IT SOLN
INTRATHECAL | Status: AC
  Filled 2011-01-07: qty 2

## 2011-01-07 MED ORDER — GLYCINE 1.5 % IR SOLN
Status: DC | PRN
  Administered 2011-01-07: 3000 mL

## 2011-01-07 SURGICAL SUPPLY — 30 items
BAG DECANTER FOR FLEXI CONT (MISCELLANEOUS) ×2 IMPLANT
BAG DRAIN URO TABLE W/ADPT NS (DRAPE) ×2 IMPLANT
BAG URINE DRAIN TURP 4L (OSTOMY) ×2 IMPLANT
BRUSH SCRUB DISP (MISCELLANEOUS) ×2 IMPLANT
CABLE HI FREQUENCY MONOPOLAR (ELECTROSURGICAL) ×2 IMPLANT
CATH FOLEY 3WAY 30CC 22F (CATHETERS) ×2 IMPLANT
CLOTH BEACON ORANGE TIMEOUT ST (SAFETY) ×2 IMPLANT
CONNECTOR 5 IN 1 STRAIGHT STRL (MISCELLANEOUS) ×2 IMPLANT
DRAPE STERI URO 23X35 APER SZ5 (DRAPE) ×2 IMPLANT
ELECT CUT LOOP C-MAX 27FR .012 (CUTTING LOOP) ×2
ELECT REM PT RETURN 9FT ADLT (ELECTROSURGICAL) ×2
ELECTRODE CUT LP CMX 27FR .012 (CUTTING LOOP) ×1 IMPLANT
ELECTRODE REM PT RTRN 9FT ADLT (ELECTROSURGICAL) ×1 IMPLANT
FLOOR PAD 36X40 (MISCELLANEOUS)
FORMALIN 10 PREFIL 480ML (MISCELLANEOUS) ×2 IMPLANT
GLOVE BIO SURGEON STRL SZ7 (GLOVE) ×2 IMPLANT
GLYCINE 1.5% IRRIG UROMATIC (IV SOLUTION) ×20 IMPLANT
GOWN BRE IMP SLV AUR XL STRL (GOWN DISPOSABLE) ×2 IMPLANT
IV NS IRRIG 3000ML ARTHROMATIC (IV SOLUTION) ×2 IMPLANT
KIT ROOM TURNOVER AP CYSTO (KITS) ×2 IMPLANT
LASER FIBER DISP (UROLOGICAL SUPPLIES) IMPLANT
MANIFOLD NEPTUNE II (INSTRUMENTS) ×2 IMPLANT
PACK CYSTO (CUSTOM PROCEDURE TRAY) ×2 IMPLANT
PAD ARMBOARD 7.5X6 YLW CONV (MISCELLANEOUS) ×2 IMPLANT
PAD FLOOR 36X40 (MISCELLANEOUS) IMPLANT
SET IRRIGATING DISP (SET/KITS/TRAYS/PACK) ×2 IMPLANT
SYR 30ML LL (SYRINGE) ×2 IMPLANT
TOWEL OR 17X26 4PK STRL BLUE (TOWEL DISPOSABLE) ×2 IMPLANT
WATER STERILE IRR 1000ML POUR (IV SOLUTION) ×2 IMPLANT
YANKAUER SUCT BULB TIP 10FT TU (MISCELLANEOUS) ×2 IMPLANT

## 2011-01-07 NOTE — Brief Op Note (Signed)
01/07/2011  12:57 PM  PATIENT:  Kevin Frank  70 y.o. male  PRE-OPERATIVE DIAGNOSIS:  Benign Prostatic Hypertrophy  POST-OPERATIVE DIAGNOSIS: bph  * PROCEDURE:  Procedure(s): TRANSURETHRAL RESECTION OF THE PROSTATE (TURP)  SURGEON:  Surgeon(s): Ky Barban  PHYSICIAN ASSISTANT:   ASSISTANTS: none   ANESTHESIA:   spinal  ESTIMATED BLOOD LOSS: *50-75cc  BLOOD ADMINISTERED:none  DRAINS: none   LOCAL MEDICATIONS USED:  NONE  SPECIMEN:   Prostate chips DISPOSITION OF SPECIMEN:  PATHOLOGY  COUNTS:  YES  TOURNIQUET:  * No tourniquets in log *  DICTATION #: [id number]                             [id number]       [id number]   PLAN OF CARE: admit for observation  PATIENT DISPOSITION:  PACU - hemodynamically stable.   Delay start of Pharmacological VTE agent (>24hrs) due to surgical blood loss or risk of bleeding:  yes

## 2011-01-07 NOTE — Plan of Care (Signed)
Problem: Phase I Progression Outcomes Goal: Other Phase I Outcomes/Goals Outcome: Completed/Met Date Met:  01/07/11 Pt given carenotes on smoking cessation and the PNA vaccine.  Info on TURP procedure was done before opertation

## 2011-01-07 NOTE — Anesthesia Postprocedure Evaluation (Signed)
  Anesthesia Post-op Note  Patient: Kevin Frank  Procedure(s) Performed:  TRANSURETHRAL RESECTION OF THE PROSTATE (TURP)  Patient Location: PACU  Anesthesia Type: Spinal  Level of Consciousness: oriented  Airway and Oxygen Therapy: Patient Spontanous Breathing  Post-op Pain: none  Post-op Assessment: Patient's Cardiovascular Status Stable, Respiratory Function Stable, Patent Airway and No signs of Nausea or vomiting  Post-op Vital Signs: stable  Complications: No apparent anesthesia complications

## 2011-01-07 NOTE — Anesthesia Procedure Notes (Addendum)
Date/Time: 01/07/2011 11:23 AM Performed by: Carolyne Littles, AMY Oxygen Delivery Method: Simple face mask    Spinal Block  Start time: 01/07/2011 11:41 AM Staffing CRNA/Resident: IDACAVAGE, ROBERT J Performed by: resident/CRNA  Preanesthetic Checklist Completed: patient identified, site marked, surgical consent, pre-op evaluation, timeout performed, IV checked, risks and benefits discussed and monitors and equipment checked Spinal Block Patient position: right lateral decubitus Prep: Betadine and site prepped and draped Patient monitoring: heart rate Approach: right paramedian Location: L2-3 Injection technique: single-shot Needle Needle type: Spinocan  Needle gauge: 22 G Needle length: 5 cm Needle insertion depth: 4 cm  Spinal Block  Assessment Sensory level: T10  Spinal Block  Additional Notes Lot [id number]  exp10/2012

## 2011-01-07 NOTE — Consult Note (Signed)
  [id number] 

## 2011-01-07 NOTE — Anesthesia Preprocedure Evaluation (Addendum)
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) Regular Normal   Neuro/Psych (+) {AN ROS/MED HX NEURO HEADACHES (+) PSYCHIATRIC DISORDERS, Depression,   GI/Hepatic/Renal   Endo/Other   (+) Diabetes mellitus-, Well Controlled, Type 2  Abdominal   Musculoskeletal  Hematology   Peds  Reproductive/Obstetrics   Anesthesia Other Findings             Anesthesia Physical Anesthesia Plan  ASA: III  Anesthesia Plan: Spinal   Post-op Pain Management:    Induction:   Airway Management Planned: Nasal Cannula  Additional Equipment:   Intra-op Plan:   Post-operative Plan:   Informed Consent: I have reviewed the patients History and Physical, chart, labs and discussed the procedure including the risks, benefits and alternatives for the proposed anesthesia with the patient or authorized representative who has indicated his/her understanding and acceptance.     Plan Discussed with:   Anesthesia Plan Comments:         Anesthesia Quick Evaluation

## 2011-01-07 NOTE — H&P (Signed)
  H&p already dictated. No change in h&p.

## 2011-01-07 NOTE — Transfer of Care (Addendum)
Immediate Anesthesia Transfer of Care Note  Patient: Kevin Frank  Procedure(s) Performed:  TRANSURETHRAL RESECTION OF THE PROSTATE (TURP)  Patient Location: PACU  Anesthesia Type: Spinal  Level of Consciousness: awake, alert , oriented and patient cooperative  Airway & Oxygen Therapy: Patient Spontanous Breathing and Patient connected to face mask oxygen  Post-op Assessment: Report given to PACU RN and Post -op Vital signs reviewed and stable  Post vital signs: stable  Complications: No apparent anesthesia complications   02-08-2011   Room 317.  NO Problems Post operative. Seems motivated to go home and waiting to see Dr Jerre Simon.

## 2011-01-07 NOTE — H&P (Signed)
NAMEZIGMOND, TRELA NO.:  [id number]  MEDICAL RECORD NO.:  [id number]  LOCATION:                                 FACILITY:  PHYSICIAN:  Ky Barban, M.D.DATE OF BIRTH:  1940/09/02  DATE OF ADMISSION: DATE OF DISCHARGE:  LH                             HISTORY & PHYSICAL   CHIEF COMPLAINT:  Difficulty voiding.  His AU score is 14.  Mr. Haverstock is a gentleman who is 70 years old being followed by me for a longtime.  He is taking Flomax and Detrol.  When I saw him in 2008, he was worked up for BPH, and he does have enlarged prostate.  I have put him on Flomax with good result, and he was also having urgency, urge incontinence.  We have added Vesicare, and he had good response symptomatically.  At this time, I did check his residual urine with bladder scan.  He had about 300 mL of residual urine and cystoscopy was done, which shows he has enlarged prostate with bladder neck obstruction, so apparently he is quite symptomatic.  He never had any UTI, fever, chills, or gross hematuria, but I went and did urine culture.  Subsequently, it came back positive with Serratia marcescens sensitive to most of the antibiotic.  We put him on Cipro 250 mg twice a day and that was started on January 06, 2011.  The patient after having cystoscopy, I checked his residual, it was 300 mL, but not having any infection at that time, so I decided not to leave a catheter.  He went home, next day he spiked temperature, so I immediately called him and we put a Foley catheter, cultured his urine, subsequently came back positive, put him on Cipro.  Clinically, he is doing well.  After doing cystoscopy, I found out that he has enlarged prostate with bladder neck obstruction.  I told him not to take any Vesicare, instead he will need a TUR prostate where the difficulty I was facing was that he has a lot of urgency, urge incontinence.  I told him and his wife that it is possible it can get  better, but if it does not get better, we will have to keep him on medicines.  It is not a guarantee that it will work, and they understand, want me to go ahead and proceed, so he is coming as outpatient.  First he will think to have it done next month by after I put a catheter, he decided to go ahead and have it done as soon as possible.  He will be coming as outpatient in the morning and will undergo TUR prostate.  He will be kept overnight in the hospital.  PAST MEDICAL HISTORY:  About 16 years ago, he had an episode of gross hematuria.  At that time, complete workup was done, which was negative. He had also coloscopy done in 2008, had a benign polyp removed, has hypertension and COPD, had cholecystectomy 15 years ago.  MEDICATION:  Allopurinol for gout.  Medicines for COPD, high cholesterol, and hypertension.  FAMILY HISTORY:  No history of prostate cancer.  PERSONAL HISTORY:  He still smokes and does not  drink any alcohol.  REVIEW OF SYSTEMS:  Denies any chest pain, orthopnea, PND, nausea, vomiting.  PHYSICAL EXAMINATION:  VITAL SIGNS:  Blood pressure 140/80, temperature is normal. CENTRAL NERVOUS SYSTEM:  No gross neurological deficit. HEAD, NECK, EYES, AND ENT:  Negative. CHEST:  Symmetrical. HEART:  Regular sinus rhythm.  No murmur. ABDOMEN:  Soft, flat.  Liver, spleen, kidneys are not palpable.  No CVA tenderness. EXTERNAL GENITALIA: Circumcised, meatus adequate.  Testicles are normal. RECTAL:  Prostate 1/2+, smooth and firm.  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  PLAN:  TUR prostate under anesthesia as outpatient.     Ky Barban, M.D.     MIJ/MEDQ  D:  01/06/2011  T:  01/07/2011  Job:  [id number]  cc:   Dr. Juanetta Gosling

## 2011-01-07 NOTE — H&P (Signed)
  H&p dictated . No change in h&p.

## 2011-01-08 LAB — BASIC METABOLIC PANEL
BUN: 10 mg/dL (ref 6–23)
Chloride: 103 mEq/L (ref 96–112)
Glucose, Bld: 133 mg/dL — ABNORMAL HIGH (ref 70–99)
Potassium: 3.7 mEq/L (ref 3.5–5.1)

## 2011-01-08 LAB — CBC
HCT: 41 % (ref 39.0–52.0)
Hemoglobin: 14.1 g/dL (ref 13.0–17.0)
MCHC: 34.4 g/dL (ref 30.0–36.0)
MCV: 95.1 fL (ref 78.0–100.0)

## 2011-01-08 LAB — GLUCOSE, CAPILLARY

## 2011-01-08 MED ORDER — METFORMIN HCL 500 MG PO TABS: 500.0000 mg | ORAL_TABLET | Freq: Two times a day (BID) | ORAL | Status: DC

## 2011-01-08 MED ORDER — METFORMIN HCL 500 MG PO TABS
500.0000 mg | ORAL_TABLET | Freq: Two times a day (BID) | ORAL | Status: DC
  Administered 2011-01-08: 500 mg via ORAL
  Filled 2011-01-08: qty 1

## 2011-01-08 NOTE — Progress Notes (Signed)
Per MD pt is OIB- no review needed

## 2011-01-08 NOTE — Progress Notes (Signed)
Inpatient Diabetes Program Recommendations  AACE/ADA: New Consensus Statement on Inpatient Glycemic Control (2009)  Target Ranges:  Prepandial:   less than 140 mg/dL      Peak postprandial:   less than 180 mg/dL (1-2 hours)      Critically ill patients:  140 - 180 mg/dL    Inpatient Diabetes Program Recommendations Correction (SSI): Check CBGs ACHS while on oral anti-diabetic medications

## 2011-01-08 NOTE — Plan of Care (Signed)
Problem: Consults Goal: Diabetes Guidelines if Diabetic/Glucose > 140 If diabetic or lab glucose is > 140 mg/dl - Initiate Diabetes/Hyperglycemia Guidelines & Document Interventions  Outcome: Progressing Path reviewed and recommendation made.

## 2011-01-08 NOTE — Progress Notes (Signed)
Subjective: 70 year old male past medical history of BPH and lateral outlet obstruction status post TURP by Dr Jerre Simon. We were consulted for diabetes management. Patient currently feels well. Has no complaints at this time.  Objective: Vital signs in last 24 hours: Temp:  [97.6 F (36.4 C)-99.5 F (37.5 C)] 97.6 F (36.4 C) (07/18 1028) Pulse Rate:  [57-81] 62  (07/18 1028) Resp:  [18-25] 21  (07/18 1028) BP: (98-166)/(56-91) 128/69 mmHg (07/18 1028) SpO2:  [90 %-99 %] 94 % (07/18 1028) Weight:  [102.059 kg (225 lb)] 225 lb (102.059 kg) (07/17 1625) Weight change:   General appearance: alert, cooperative and no distress  Lab Results:  Basename 01/08/11 0531 01/06/11 1416  NA 137 138  K 3.7 3.8  CL 103 103  CO2 25 27  GLUCOSE 133* 95  BUN 10 15  CREATININE 0.64 0.55  CALCIUM 9.1 9.4    Basename 01/08/11 0531 01/06/11 1416  WBC 14.5* 11.2*  HGB 14.1 14.7  HCT 41.0 41.9  PLT 168 153    Studies/Results:      Medications: I have reviewed the patient's current medications.  Assessment/Plan: 1. BPH with bladder outlet obstruction status post TURP per urology. 2. controlled diabetes mellitus type 2 patient home medication restarted she currentlly well controlled. No further changes. 3. hypertension fairly well controlled with changes.  LOS: 1 day   FELIZ ORTIZ, ABRAHAM 01/08/2011, 11:57 AM

## 2011-01-08 NOTE — Consult Note (Signed)
NAMEBABY, STAIRS NO.:  [id number]  MEDICAL RECORD NO.:  [id number]  LOCATION:  A317                          FACILITY:  [facility]  PHYSICIAN:  Talmage Nap, MD  DATE OF BIRTH:  [birth year]  DATE OF CONSULTATION:01/07/2011 DATE OF DISCHARGE:                                CONSULTATION   PHYSICIAN REQUESTING FOR THE CONSULTATION:  Ky Barban, MD (urologist).  INDICATION FOR THE CONSULTATION:  Management of Diabetes Mellitus.    The patientis a 70 year old Caucasian male with history of BPH with bladder outlet Obstruction, hypertension and hyperlipidemia, had transurethral resection of the prostate done today by Dr. Jerre Simon and postoperatively the patient was said to have done well.  However, the patient was observed to have elevated blood sugar levels greater than 200 mg percent.  He denied any symptoms.  He denied any chest pain.  He denied any shortness of breath.  He denied any systemic symptoms i.e. no fever, no chills, no rigor.  The patient's primary attending consulted me and subsequently I was asked to evaluate the patient because of the elevated blood sugar level.  PAST MEDICAL HISTORY:  Positive for; 1. Hypertension. 2. BPH with bladder outlet obstruction. 3. Hyperlipidemia. 4. Abdominal aortic aneurysm. 5. Obesity. 6. Obstructive sleep apnea, on CPAP. 7. Chronic tobacco use. 8. Diabetes mellitus. 9. Gout.  PAST SURGICAL HISTORY: 1. Cholecystectomy. 2. Most recently TURP secondary to BPH with bladder outlet     obstruction.  MEDICATIONS:  Current meds include; 1. Ciprofloxacin 200 mg q.12. 2. Gentamicin, dosing done by pharmacy. 3. Glycopyrrolate injection. 4. Hydrocodone/acetaminophen.  Home meds include; 1. Allopurinol 300 mg p.o. daily. 2. Norvasc 10 mg p.o. daily. 3. Ciprofloxacin 250 mg 2 tablets p.o. twice a day. 4. Lasix 40 mg p.o. daily. 5. Lisinopril 5 mg p.o. daily. 6. Metformin (Glucophage 500 mg 2 tablets  daily). 7. Metoprolol succinate (Toprol XL 25 mg 1 p.o. daily). 8. Sertraline (Zoloft 50 mg 1 p.o. daily). 9. Simvastatin 80 mg p.o. daily. 10.Spiriva HandiHaler 80 mcg inhale p.r.n. 11.Tamsulosin 0.4 mg p.o. daily.  ALLERGIES:  He has no known allergies.  SOCIAL HISTORY:  Quam smokes about a pack of cigarette per day for the past 50 years.  Denies any history of alcohol use and is currently retired.  FAMILY HISTORY:  Positive for lymphoma and liver disease and both parents are deceased.  REVIEW OF SYSTEMS:  The patient denies any history of headaches.  No blurry vision.  No chest pain or shortness of breath.  No cough.  No abdominal discomfort.  No diarrhea or hematochezia.  He has an indwelling Foley catheter.  No swelling of the lower extremities.  No intolerance to heat or cold and no neuropsychiatric disorder.  PHYSICAL EXAMINATION:  GENERAL:  A very pleasant man not dehydrated and not in any respiratory distress at present. VITAL SIGNS:  Temperature is 98.7, pulse 72, respiratory rate is 20, blood pressure is 142/75, saturating 94% on room air. HEENT:  Pupils are reactive to light and extraocular muscles are intact. NECK:  He has no jugular venous distention.  No carotid bruit.  No lymphadenopathy. CHEST:  Clear to auscultation. HEART:  Sounds are 1 and  2. ABDOMEN:  Soft, nontender.  Liver, spleen, and kidney not palpable. Bowel sounds are positive. EXTREMITIES:  No pedal edema. NEUROLOGIC:  Nonfocal. MUSCULOSKELETAL:  Unremarkable. SKIN:  Normal turgor.  LABORATORY DATA:  Chemistry shows sodium of 138, potassium of 3.8, chloride of 102 with a bicarb of 27, BUN is 15, creatinine 0.55, blood capillary level is 237.  Hematological indices showed WBC of 7.2, hemoglobin of 14.7, hematocrit of 41.9, MCV of 94.8, platelet count of 153.  PROBLEMS LIST: 1. Transurethral resection of prostate secondary to benign prostatic     hypertrophy with bladder outside  obstruction. 2. Uncontrolled blood sugar levels.  ADMITTING IMPRESSION: 1. Benign prostatic hypertrophy with bladder outlet obstruction status     post transurethral resection of the prostate. 2. Hyperglycemia (uncontrolled). 3. Hypertension. 4. Hyperlipidemia. 5. History of abdominal aortic aneurysm. 6. Obstructive sleep apnea. 7. Chronic tobacco use. 8. History of gout.  PLAN:  To restart the patient's antidiabetic medication and that is metformin and the patient also to be on Accu-Chek with insulin sliding scale.  Subsequent management of patient's TURP will be done by the urologist and the patient will also continue on other medication that was initiated on admission.  He will be followed and evaluated on a daily basis.  Thank you for allowing me to participate in the management of this patient.     Talmage Nap, MD     CN/MEDQ  D:  01/07/2011  T:  01/07/2011  Job:  [id number]

## 2011-01-08 NOTE — Op Note (Signed)
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  PROCEDURE:  Transurethral resection of the prostate.  SURGEON:  Ky Barban, MD  ANESTHESIA:  Spinal.  PROCEDURE IN DETAIL:  The patient placed under spinal anesthesia in lithotomy position.  After usual prep and drape, a #28 Iglesias resectoscope was introduced into the bladder.  It was inspected again. There was significant obstruction of the bladder neck with lateral lobe hypertrophy.  Bladder neck was circumferentially dissected and bleeders were coagulated.  Resectoscope was pulled back at the level of the verumontanum, rotated to 11 o'clock position.  Resection of the right lobe was done between 11 and 7 o'clock position.  Similarly, the left lobe was resected between 1 and 5 o'clock position.  Next, there was a considerable tissue in the anterior midline at 12 o'clock position.  It was resected.  Next, I resected the tissue in the posterior midline along with the apical tissue very carefully not to injure the sphincter of the verumontanum.  Prostatic urethra looks wide open.  Chips were evacuated.  Bleeders were coagulated.  The resectoscope was removed and 22 three-way Foley catheter left in for drainage.  CBI started which is clear.  The patient left the operating room in satisfactory condition.     Ky Barban, M.D.     MIJ/MEDQ  D:  01/07/2011  T:  01/08/2011  Job:  [id number]

## 2011-01-08 NOTE — Progress Notes (Signed)
Discharge instructions given on medications,and follow up visits,patient verbalized understanding.Patient d/c with foley intact which was changed to a leg foley bag to travel per Dr Jerre Simon ,instructions were given on foley cath care,patient verbalized understanding.family at bedside.No c/o pain or discomfort noted.Accompanied by staff to an awaiting vechicle.

## 2011-01-15 NOTE — Discharge Summary (Signed)
  REPORT# [id number]

## 2011-01-16 NOTE — Discharge Summary (Signed)
Kevin Frank, Kevin Frank NO.:  [id number]  MEDICAL RECORD NO.:  [id number]  LOCATION:  A317                          FACILITY:  [facility]  PHYSICIAN:  Ky Barban, M.D.DATE OF BIRTH:  1940/12/03  DATE OF ADMISSION:  01/07/2011 DATE OF DISCHARGE:  07/18/2012LH                              DISCHARGE SUMMARY   HISTORY OF PRESENT ILLNESS:  This is a 70 year old gentleman who has a longstanding history of prostatism.  His residual urine was over 300 mL, but he was really not complaining of much difficulty voiding.  He has been on Flomax but continued to have significant residual urine,  so he had a routine preop workup done.  CBC, urinalysis, and SMA-7 which is within normal limits.  He was taken to the operating room where TUR prostate was done.  Postop course was benign.  His CBI was clear next day.  CBC and BMET was normal.  I have consulted the Hospitalist Service to follow up for his diabetes control which he is doing well.  He was started on his preop medication Glucophage.  On his first postop day, urine was clear, so CBI was discontinued, so I am going to discharge him home.  We will see him back in the office in a couple of days to take the catheter out.  FINAL DISCHARGE DIAGNOSES: 1. Pathology report is still not back and it is probably benign     prostatic hypertrophy. 2. Diabetes.  I by mistake consulted the Hospitalist Service, I did not know he is a patient of Dr. Juanetta Gosling.  Anyway, we will send a copy to Dr. Juanetta Gosling.  He should continue to resume his home meds.  I will see him back in a couple of days in the office to remove his Foley catheter.     Ky Barban, M.D.     MIJ/MEDQ  D:  01/15/2011  T:  01/16/2011  Job:  [id number]  cc:   Ramon Dredge L. Juanetta Gosling, M.D. Fax: [phone]

## 2011-01-17 ENCOUNTER — Encounter (HOSPITAL_COMMUNITY): Payer: Self-pay | Admitting: Urology

## 2011-04-15 ENCOUNTER — Other Ambulatory Visit (HOSPITAL_COMMUNITY): Payer: Self-pay | Admitting: Pulmonary Disease

## 2011-04-15 ENCOUNTER — Ambulatory Visit (HOSPITAL_COMMUNITY)
Admission: RE | Admit: 2011-04-15 | Discharge: 2011-04-15 | Disposition: A | Discharge status: Home / Self Care | Payer: Medicare Other | Source: Ambulatory Visit | Attending: Pulmonary Disease | Admitting: Pulmonary Disease

## 2011-04-15 DIAGNOSIS — M549 Dorsalgia, unspecified: Secondary | ICD-10-CM

## 2011-04-15 DIAGNOSIS — R0781 Pleurodynia: Secondary | ICD-10-CM

## 2011-04-15 DIAGNOSIS — R918 Other nonspecific abnormal finding of lung field: Secondary | ICD-10-CM | POA: Insufficient documentation

## 2011-04-15 DIAGNOSIS — R0789 Other chest pain: Secondary | ICD-10-CM | POA: Insufficient documentation

## 2011-04-15 DIAGNOSIS — M546 Pain in thoracic spine: Secondary | ICD-10-CM | POA: Insufficient documentation

## 2011-05-02 ENCOUNTER — Ambulatory Visit (HOSPITAL_COMMUNITY)
Admission: RE | Admit: 2011-05-02 | Discharge: 2011-05-02 | Disposition: A | Discharge status: Home / Self Care | Payer: Medicare Other | Source: Ambulatory Visit | Attending: Pulmonary Disease | Admitting: Pulmonary Disease

## 2011-05-02 ENCOUNTER — Other Ambulatory Visit (HOSPITAL_COMMUNITY): Payer: Self-pay | Admitting: Pulmonary Disease

## 2011-05-02 DIAGNOSIS — R079 Chest pain, unspecified: Secondary | ICD-10-CM | POA: Insufficient documentation

## 2011-05-02 DIAGNOSIS — R042 Hemoptysis: Secondary | ICD-10-CM | POA: Insufficient documentation

## 2011-05-05 ENCOUNTER — Other Ambulatory Visit: Payer: Self-pay | Admitting: Nurse Practitioner

## 2011-05-05 ENCOUNTER — Other Ambulatory Visit: Payer: Self-pay | Admitting: Cardiovascular Disease

## 2011-05-05 MED ORDER — AMLODIPINE BESYLATE 10 MG PO TABS: 10.0000 mg | ORAL_TABLET | Freq: Every day | ORAL | Status: DC

## 2011-06-05 ENCOUNTER — Other Ambulatory Visit: Payer: Self-pay

## 2011-06-05 MED ORDER — METOPROLOL SUCCINATE ER 25 MG PO TB24: 25.0000 mg | ORAL_TABLET | Freq: Every day | ORAL | Status: DC

## 2011-06-05 NOTE — Telephone Encounter (Signed)
..   Requested Prescriptions   Signed Prescriptions Disp Refills  . metoprolol succinate (TOPROL-XL) 25 MG 24 hr tablet 30 tablet 6    Sig: Take 1 tablet (25 mg total) by mouth daily.    Authorizing Provider: Tonny Bollman D    Ordering User: Lacie Scotts

## 2011-06-09 ENCOUNTER — Other Ambulatory Visit: Payer: Self-pay

## 2011-07-18 ENCOUNTER — Encounter: Payer: Self-pay | Admitting: Cardiovascular Disease

## 2011-07-18 ENCOUNTER — Ambulatory Visit (INDEPENDENT_AMBULATORY_CARE_PROVIDER_SITE_OTHER): Payer: Medicare Other | Admitting: Cardiovascular Disease

## 2011-07-18 VITALS — BP 142/74 | HR 66 | Ht 70.0 in | Wt 225.0 lb

## 2011-07-18 DIAGNOSIS — I10 Essential (primary) hypertension: Secondary | ICD-10-CM

## 2011-07-18 DIAGNOSIS — E7800 Pure hypercholesterolemia, unspecified: Secondary | ICD-10-CM

## 2011-07-18 DIAGNOSIS — I714 Abdominal aortic aneurysm, without rupture, unspecified: Secondary | ICD-10-CM

## 2011-07-18 DIAGNOSIS — F17200 Nicotine dependence, unspecified, uncomplicated: Secondary | ICD-10-CM

## 2011-07-18 MED ORDER — LOSARTAN POTASSIUM 50 MG PO TABS: 50.0000 mg | ORAL_TABLET | Freq: Every day | ORAL | Status: DC

## 2011-07-18 NOTE — Assessment & Plan Note (Signed)
Lipids last checked in June 2012 were excellent with an LDL of 81 and an HDL of 46. He continues on simvastatin 80 mg. His LFTs have been within normal limits.

## 2011-07-18 NOTE — Progress Notes (Signed)
HPI:  Kevin Frank is a very nice 71 year old gentleman presenting for followup evaluation. He has been followed by [name] in the past and I will be assuming his cardiac care from here forward.  The patient is followed for hypertension, hyperlipidemia, tobacco abuse, and abdominal aortic aneurysm. He has a 3.2 cm aneurysm and is scheduled for repeat ultrasound imaging in March 2013 at the [facility] office where he is followed by [name].  The patient has had a normal stress nuclear test in the past several years.  The patient is doing fairly well at present. He complains of slowly progressive dyspnea and a nonproductive cough. He is a long-time smoker and hasn't had much interest in quitting. He denies chest pain or pressure. He denies palpitations, edema, lightheadedness, or syncope. He has several rental properties and maintains an active lifestyle.  Outpatient Encounter Prescriptions as of 07/18/2011  Medication Sig Dispense Refill  . albuterol (PROVENTIL HFA;VENTOLIN HFA) 108 (90 BASE) MCG/ACT inhaler Inhale 2 puffs into the lungs every 6 (six) hours as needed.      [name] allopurinol (ZYLOPRIM) 300 MG tablet Take 300 mg by mouth daily.        [name] amLODipine (NORVASC) 10 MG tablet Take 1 tablet (10 mg total) by mouth daily.  30 tablet  6  . beclomethasone (QVAR) 80 MCG/ACT inhaler Inhale 1 puff into the lungs as needed.      . furosemide (LASIX) 40 MG tablet Take 40 mg by mouth daily.        . metFORMIN (GLUCOPHAGE) 500 MG tablet Take 500 mg by mouth 2 (two) times daily with a meal.        . metoprolol succinate (TOPROL-XL) 25 MG 24 hr tablet Take 1 tablet (25 mg total) by mouth daily.  30 tablet  6  . sertraline (ZOLOFT) 50 MG tablet Take 50 mg by mouth daily.        . simvastatin (ZOCOR) 80 MG tablet Take 80 mg by mouth daily.       [name] SPIRIVA HANDIHALER 18 MCG inhalation capsule Place 18 mcg into inhaler and inhale as needed. For congestion/breathing      . DISCONTD: lisinopril (PRINIVIL,ZESTRIL) 5  MG tablet Take 5 mg by mouth daily.        [name] losartan (COZAAR) 50 MG tablet Take 1 tablet (50 mg total) by mouth daily.  90 tablet  3    No Known Allergies  Past Medical History  Diagnosis Date  . Hyperlipidemia   . Hypertension   . AAA (abdominal aortic aneurysm) 01/2008    3.2cm  . Obesity   . Hypercholesterolemia   . Shortness of breath     due to cigarette abuse  . Obstructive sleep apnea on CPAP   . Tobacco abuse   . Normal nuclear stress test 2011  . Diabetes mellitus     type II  . BPH (benign prostatic hyperplasia)   . Gout   . Presence of indwelling urinary catheter     ROS: Negative except as per HPI  BP 142/74  Pulse 66  Ht 5\' 10"  (1.778 m)  Wt 102.059 kg (225 lb)  BMI 32.28 kg/m2  PHYSICAL EXAM: Pt is alert and oriented, NAD HEENT: normal Neck: JVP - normal, carotids 2+= without bruits Lungs: CTA bilaterally CV: RRR without murmur or gallop Abd: soft, NT, Positive BS, no hepatomegaly Ext: no C/C/E, distal pulses intact and equal Skin: warm/dry no rash  EKG:  Normal sinus rhythm with  incomplete right bundle branch block, heart rate 66 beats per minute.  ASSESSMENT AND PLAN:

## 2011-07-18 NOTE — Assessment & Plan Note (Signed)
Followed by vascular surgery. 

## 2011-07-18 NOTE — Assessment & Plan Note (Signed)
Discussed tobacco cessation, but the patient is not inclined to quit at this point. I offered our assistance with medication if he desires in the future.

## 2011-07-18 NOTE — Assessment & Plan Note (Addendum)
Blood pressure control is borderline. He has a nonproductive cough and I recommended discontinuing lisinopril and trying losartan 50 mg daily. I'll have the patient follow-up with Norma Fredrickson in 6 months.

## 2011-07-18 NOTE — Patient Instructions (Signed)
Your physician wants you to follow-up in: 6 MONTHS with Kevin Fredrickson NP.  You will receive a reminder letter in the mail two months in advance. If you don't receive a letter, please call our office to schedule the follow-up appointment.  Your physician recommends that you return for a FASTING LIPID, LIVER and BMP in 6 MONTHS--nothing to eat or drink after midnight, lab opens at 8:30.  Your physician has recommended you make the following change in your medication:  STOP Lisinopril, START Losartan 50mg  take one by mouth daily

## 2011-08-22 ENCOUNTER — Encounter: Payer: Self-pay | Admitting: Surgery

## 2011-08-25 ENCOUNTER — Ambulatory Visit (INDEPENDENT_AMBULATORY_CARE_PROVIDER_SITE_OTHER): Payer: Medicare Other | Admitting: Surgery

## 2011-08-25 ENCOUNTER — Encounter (INDEPENDENT_AMBULATORY_CARE_PROVIDER_SITE_OTHER): Payer: Medicare Other | Admitting: *Deleted

## 2011-08-25 ENCOUNTER — Encounter: Payer: Self-pay | Admitting: Surgery

## 2011-08-25 VITALS — BP 137/73 | HR 55 | Resp 16 | Ht 70.0 in | Wt 223.0 lb

## 2011-08-25 DIAGNOSIS — I714 Abdominal aortic aneurysm, without rupture: Secondary | ICD-10-CM

## 2011-08-25 NOTE — Progress Notes (Signed)
[facility] [place]   Patient name: Kevin Frank MRN: [id number] DOB: [birth year] Sex: male     Chief Complaint  Patient presents with  . AAA    One year f/up w/Labs    HISTORY OF PRESENT ILLNESS: The patient comes back today for followup of his infrarenal abdominal aortic aneurysm. This was first detected by MRI and 2008 for back pain. He continues to be asymptomatic. He denies abdominal pain.  The patient continues to be medically managed for his diabetes. His blood pressure and cholesterol are controlled medically. He continues to smoke.  Past Medical History  Diagnosis Date  . Hyperlipidemia   . Hypertension   . AAA (abdominal aortic aneurysm) 01/2008    3.2cm  . Obesity   . Hypercholesterolemia   . Shortness of breath     due to cigarette abuse  . Obstructive sleep apnea on CPAP   . Tobacco abuse   . Normal nuclear stress test 2011  . Diabetes mellitus     type II  . BPH (benign prostatic hyperplasia)   . Gout   . Presence of indwelling urinary catheter   . COPD (chronic obstructive pulmonary disease)     Past Surgical History  Procedure Date  . Cholecystectomy 1997    [facility]  . Transurethral resection of prostate 01/07/2011    Procedure: TRANSURETHRAL RESECTION OF THE PROSTATE (TURP);  Surgeon: Ky Barban;  Location: [facility];  Service: Urology;  Laterality: N/A;  . Cholecystectomy 1989    History   Social History  . Marital Status: Married    Spouse Name: N/A    Number of Children: N/A  . Years of Education: N/A   Occupational History  . Not on file.   Social History Main Topics  . Smoking status: Current Everyday Smoker -- 2.0 packs/day for 50 years    Types: Cigarettes  . Smokeless tobacco: Never Used  . Alcohol Use: No  . Drug Use: No  . Sexually Active: Not on file   Other Topics Concern  . Not on file   Social History Narrative  . No narrative on file    Family History  Problem Relation Age of Onset  .  Liver disease Father   . Lymphoma Mother   . Diabetes Mother   . Hypertension Mother   . Mental retardation      sibling -- from birth     Allergies as of 08/25/2011  . (No Known Allergies)    Current Outpatient Prescriptions on File Prior to Visit  Medication Sig Dispense Refill  . albuterol (PROVENTIL HFA;VENTOLIN HFA) 108 (90 BASE) MCG/ACT inhaler Inhale 2 puffs into the lungs every 6 (six) hours as needed.      [name] allopurinol (ZYLOPRIM) 300 MG tablet Take 300 mg by mouth daily.        [name] amLODipine (NORVASC) 10 MG tablet Take 1 tablet (10 mg total) by mouth daily.  30 tablet  6  . beclomethasone (QVAR) 80 MCG/ACT inhaler Inhale 1 puff into the lungs as needed.      . furosemide (LASIX) 40 MG tablet Take 40 mg by mouth daily.        . metFORMIN (GLUCOPHAGE) 500 MG tablet Take 500 mg by mouth 2 (two) times daily with a meal.        . metoprolol succinate (TOPROL-XL) 25 MG 24 hr tablet Take 1 tablet (25 mg total) by mouth daily.  30 tablet  6  . sertraline (  ZOLOFT) 50 MG tablet Take 50 mg by mouth daily.        . simvastatin (ZOCOR) 80 MG tablet Take 80 mg by mouth daily.       [name] SPIRIVA HANDIHALER 18 MCG inhalation capsule Place 18 mcg into inhaler and inhale as needed. For congestion/breathing      . losartan (COZAAR) 50 MG tablet Take 1 tablet (50 mg total) by mouth daily.  90 tablet  3     REVIEW OF SYSTEMS: Cardiovascular: No chest pain, chest pressure, palpitations, orthopnea, or dyspnea on exertion. No claudication or rest pain,  No history of DVT or phlebitis. Pulmonary: No productive cough, asthma or wheezing. Neurologic: No weakness, paresthesias, aphasia, or amaurosis. No dizziness. Hematologic: No bleeding problems or clotting disorders. Musculoskeletal: No joint pain or joint swelling. Gastrointestinal: No blood in stool or hematemesis Genitourinary: No dysuria or hematuria. Psychiatric:: No history of major depression. Integumentary: No rashes or  ulcers. Constitutional: No fever or chills.  PHYSICAL EXAMINATION:   Vital signs are BP 137/73  Pulse 55  Resp 16  Ht 5\' 10"  (1.778 m)  Wt 223 lb (101.152 kg)  BMI 32.00 kg/m2  SpO2 97% General: The patient appears their stated age. HEENT:  No gross abnormalities Pulmonary:  Non labored breathing Abdomen: Soft and non-tender no pulsatile mass Musculoskeletal: There are no major deformities. Neurologic: No focal weakness or paresthesias are detected, Skin: There are no ulcer or rashes noted. Psychiatric: The patient has normal affect. Cardiovascular: There is a regular rate and rhythm without significant murmur appreciated. No carotid bruit2   Diagnostic Studies Duplex ultrasound was ordered and reviewed today. This shows a change in his aneurysm size from 3.3-3.5  Assessment: Small asymptomatic abdominal aortic aneurysm Plan: The patient will continue with yearly ultrasound followup. I told him that I think he is at very low risk for rupture given his current size. We will continue to monitor this.  Jorge Ny, M.D. [facility] [place] Office: [phone] Pager:  [phone]

## 2011-09-01 NOTE — Procedures (Unsigned)
DUPLEX ULTRASOUND OF ABDOMINAL AORTA  INDICATION:  AAA follow-up.  HISTORY: Diabetes:  Yes. Cardiac:  No. Hypertension:  Yes. Smoking:  Yes. Connective Tissue Disorder: Family History: Previous Surgery:  DUPLEX EXAM:         AP (cm)                   TRANSVERSE (cm) Proximal             1.85 cm Mid                  3.5 cm                    3.5 cm Distal               2.0 cm Right Iliac          NV                        NV Left Iliac           NV                        NV  PREVIOUS:  Date: 08/19/2010  AP:  3.34  TRANSVERSE:  3.23  IMPRESSION: 1. Technically difficult study due to chronic obstructive pulmonary     disease coughing fits. 2. Abdominal aortic aneurysm measures approximately 3.5 cm x 3.5 cm,     which is a slight increase since previous exam. 3. The bilateral CIA could not be visualized.  ___________________________________________ V. Charlena Cross, MD  LT/MEDQ  D:  08/26/2011  T:  08/26/2011  Job:  [id number]

## 2011-11-12 ENCOUNTER — Ambulatory Visit (HOSPITAL_COMMUNITY)
Admission: RE | Admit: 2011-11-12 | Discharge: 2011-11-12 | Disposition: A | Discharge status: Home / Self Care | Payer: Medicare Other | Source: Ambulatory Visit | Attending: Pulmonary Disease | Admitting: Pulmonary Disease

## 2011-11-12 ENCOUNTER — Other Ambulatory Visit (HOSPITAL_COMMUNITY): Payer: Self-pay | Admitting: Pulmonary Disease

## 2011-11-12 DIAGNOSIS — J438 Other emphysema: Secondary | ICD-10-CM | POA: Insufficient documentation

## 2011-11-12 DIAGNOSIS — R042 Hemoptysis: Secondary | ICD-10-CM | POA: Insufficient documentation

## 2012-03-17 ENCOUNTER — Other Ambulatory Visit: Payer: Self-pay | Admitting: Cardiovascular Disease

## 2012-03-30 ENCOUNTER — Other Ambulatory Visit: Payer: Self-pay | Admitting: Cardiovascular Disease

## 2012-06-04 ENCOUNTER — Other Ambulatory Visit: Payer: Self-pay | Admitting: Cardiovascular Disease

## 2012-08-23 ENCOUNTER — Other Ambulatory Visit: Payer: Self-pay | Admitting: *Deleted

## 2012-08-23 DIAGNOSIS — I714 Abdominal aortic aneurysm, without rupture: Secondary | ICD-10-CM

## 2012-08-30 ENCOUNTER — Encounter: Payer: Self-pay | Admitting: Surgery

## 2012-08-30 ENCOUNTER — Ambulatory Visit (INDEPENDENT_AMBULATORY_CARE_PROVIDER_SITE_OTHER): Payer: Medicare Other | Admitting: Surgery

## 2012-08-30 ENCOUNTER — Encounter (INDEPENDENT_AMBULATORY_CARE_PROVIDER_SITE_OTHER): Payer: Medicare Other | Admitting: *Deleted

## 2012-08-30 VITALS — BP 147/69 | HR 58 | Ht 70.0 in | Wt 225.0 lb

## 2012-08-30 DIAGNOSIS — I714 Abdominal aortic aneurysm, without rupture: Secondary | ICD-10-CM

## 2012-08-30 DIAGNOSIS — Z48812 Encounter for surgical aftercare following surgery on the circulatory system: Secondary | ICD-10-CM

## 2012-08-30 NOTE — Progress Notes (Signed)
[facility] [place]   Patient name: Kevin Frank MRN: [id number] DOB: 1941-02-01 Sex: male     Chief Complaint  Patient presents with  . AAA    1 year f/u     HISTORY OF PRESENT ILLNESS: The patient comes back today for followup of his infrarenal abdominal aortic aneurysm. This was first detected by MRI and 2008 for back pain. He continues to be asymptomatic. He denies abdominal pain.  The patient continues to be medically managed for his diabetes. He states his most recent hemoglobin A1c was 6.7. His blood pressure and cholesterol are controlled medically. He continues to smoke.   Past Medical History  Diagnosis Date  . Hyperlipidemia   . Hypertension   . AAA (abdominal aortic aneurysm) 01/2008    3.2cm  . Obesity   . Hypercholesterolemia   . Shortness of breath     due to cigarette abuse  . Obstructive sleep apnea on CPAP   . Tobacco abuse   . Normal nuclear stress test 2011  . Diabetes mellitus     type II  . BPH (benign prostatic hyperplasia)   . Gout   . Presence of indwelling urinary catheter   . COPD (chronic obstructive pulmonary disease)     Past Surgical History  Procedure Laterality Date  . Cholecystectomy  1997    [facility]  . Transurethral resection of prostate  01/07/2011    Procedure: TRANSURETHRAL RESECTION OF THE PROSTATE (TURP);  Surgeon: Ky Barban;  Location: [facility];  Service: Urology;  Laterality: N/A;  . Cholecystectomy  1989    History   Social History  . Marital Status: Married    Spouse Name: N/A    Number of Children: N/A  . Years of Education: N/A   Occupational History  . Not on file.   Social History Main Topics  . Smoking status: Current Every Day Smoker -- 2.00 packs/day for 50 years    Types: Cigarettes  . Smokeless tobacco: Never Used  . Alcohol Use: No  . Drug Use: No  . Sexually Active: Not on file   Other Topics Concern  . Not on file   Social History Narrative  . No narrative on file     Family History  Problem Relation Age of Onset  . Liver disease Father   . Lymphoma Mother   . Diabetes Mother   . Hypertension Mother   . Mental retardation      sibling -- from birth     Allergies as of 08/30/2012  . (No Known Allergies)    Current Outpatient Prescriptions on File Prior to Visit  Medication Sig Dispense Refill  . albuterol (PROVENTIL HFA;VENTOLIN HFA) 108 (90 BASE) MCG/ACT inhaler Inhale 2 puffs into the lungs every 6 (six) hours as needed.      [name] allopurinol (ZYLOPRIM) 300 MG tablet Take 300 mg by mouth daily.        . beclomethasone (QVAR) 80 MCG/ACT inhaler Inhale 1 puff into the lungs as needed.      . COZAAR 50 MG tablet TAKE 1 TABLET ONCE DAILY.  90 tablet  0  . furosemide (LASIX) 40 MG tablet Take 40 mg by mouth daily.        . metFORMIN (GLUCOPHAGE) 500 MG tablet Take 500 mg by mouth 2 (two) times daily with a meal.        . NORVASC 10 MG tablet TAKE 1 TABLET ONCE DAILY.  30 tablet  2  .  sertraline (ZOLOFT) 50 MG tablet Take 50 mg by mouth daily.        . simvastatin (ZOCOR) 80 MG tablet Take 80 mg by mouth daily.       [name] SPIRIVA HANDIHALER 18 MCG inhalation capsule Place 18 mcg into inhaler and inhale as needed. For congestion/breathing      . TOPROL XL 25 MG 24 hr tablet TAKE 1 TABLET ONCE DAILY.  30 tablet  4   No current facility-administered medications on file prior to visit.     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 Otherwise there have been no change in his review of systems since his previous visit.  PHYSICAL EXAMINATION:   Vital signs are Ht 5\' 10"  (1.778 m)  Wt 225 lb (102.059 kg)  BMI 32.28 kg/m2 General: The patient appears their stated age. HEENT:  No gross abnormalities Pulmonary:  Non labored breathing Abdomen: Soft and non-tender Musculoskeletal: There are no major deformities. Neurologic: No focal weakness or paresthesias are detected, Skin: There are no ulcer  or rashes noted. Psychiatric: The patient has normal affect. Cardiovascular: There is a regular rate and rhythm without significant murmur appreciated. No carotid bruits. Palpable posterior tibial pulse bilaterally   Diagnostic Studies Duplex ultrasound was ordered and reviewed today. This shows a slight progression in the maximum diameter of his aneurysm. Now measures 3.75 cm. This is compared to 3.5 cm one year ago.  Assessment: Small abdominal aortic aneurysm Plan: We again have discussed the natural history of aneurysmal disease of the aorta. Based on his size, he is at very low risk for rupture currently. I recommended continued yearly surveillance. He'll follow up in one year with a repeat ultrasound  V. Charlena Cross, M.D. [facility] [place] Office: [phone] Pager:  [phone]

## 2012-08-30 NOTE — Addendum Note (Signed)
Addended by: Sharee Pimple on: 08/30/2012 01:55 PM   Modules accepted: Orders

## 2012-09-29 ENCOUNTER — Other Ambulatory Visit: Payer: Self-pay | Admitting: Cardiovascular Disease

## 2012-11-01 ENCOUNTER — Other Ambulatory Visit: Payer: Self-pay | Admitting: Cardiovascular Disease

## 2012-12-01 ENCOUNTER — Other Ambulatory Visit: Payer: Self-pay | Admitting: Cardiovascular Disease

## 2012-12-23 ENCOUNTER — Ambulatory Visit (INDEPENDENT_AMBULATORY_CARE_PROVIDER_SITE_OTHER): Payer: Medicare Other | Admitting: Cardiovascular Disease

## 2012-12-23 ENCOUNTER — Encounter: Payer: Self-pay | Admitting: Cardiovascular Disease

## 2012-12-23 VITALS — BP 134/78 | HR 66 | Ht 70.0 in | Wt 230.0 lb

## 2012-12-23 DIAGNOSIS — E7800 Pure hypercholesterolemia, unspecified: Secondary | ICD-10-CM

## 2012-12-23 DIAGNOSIS — I10 Essential (primary) hypertension: Secondary | ICD-10-CM

## 2012-12-23 MED ORDER — METOPROLOL SUCCINATE ER 25 MG PO TB24: 25.0000 mg | ORAL_TABLET | Freq: Every day | ORAL | Status: DC

## 2012-12-23 MED ORDER — AMLODIPINE BESYLATE 10 MG PO TABS: 10.0000 mg | ORAL_TABLET | Freq: Every day | ORAL | Status: DC

## 2012-12-23 NOTE — Patient Instructions (Addendum)
Your physician has recommended you make the following change in your medication: DECREASE Simvastatin to 20mg  daily (80mg  no longer recommended by FDA and Simvastatin also interacts with Amlodipine, Please contact Dr Adah Perl office to address cholesterol management)  Your physician wants you to follow-up in: 1 YEAR with Dr Excell Seltzer.  You will receive a reminder letter in the mail two months in advance. If you don't receive a letter, please call our office to schedule the follow-up appointment.

## 2012-12-23 NOTE — Progress Notes (Signed)
HPI:  72 year old gentleman returning for followup evaluation. He has a history of hypertension, hyperlipidemia, tobacco abuse, and small abdominal aortic aneurysm. His aneurysm is followed by Dr. Myra Gianotti with vascular surgery.  He feels like he is doing fairly well. He continues to be a heavy smoker and is not going to quit. He is short of breath. Despite that, he is able to stay fairly active. He can do the things that he enjoys. He still likes to go fishing. He's had no chest pain, chest pressure, orthopnea, PND, lightheadedness, or syncope.  Outpatient Encounter Prescriptions as of 12/23/2012  Medication Sig Dispense Refill  . albuterol (PROVENTIL HFA;VENTOLIN HFA) 108 (90 BASE) MCG/ACT inhaler Inhale 2 puffs into the lungs every 6 (six) hours as needed.      [name] allopurinol (ZYLOPRIM) 300 MG tablet Take 300 mg by mouth daily.        [name] amLODipine (NORVASC) 10 MG tablet TAKE 1 TABLET BY MOUTH ONCE DAILY. PATIENT NEEDS TO BE SEEN.  15 tablet  0  . beclomethasone (QVAR) 80 MCG/ACT inhaler Inhale 1 puff into the lungs as needed.      . furosemide (LASIX) 40 MG tablet Take 40 mg by mouth daily.        [name] losartan (COZAAR) 50 MG tablet TAKE 1 TABLET DAILY. CALL OFFICE AND SCHEDULE SIX MONTH FOLLOW-UP ANDLAB APPOINTMENT.  30 tablet  6  . metFORMIN (GLUCOPHAGE) 500 MG tablet Take 500 mg by mouth 2 (two) times daily with a meal.        . metoprolol succinate (TOPROL-XL) 25 MG 24 hr tablet TAKE 1 TABLET BY MOUTH ONCE DAILY. PATIENT NEEDS TO BE SEEN.  15 tablet  0  . sertraline (ZOLOFT) 50 MG tablet Take 50 mg by mouth daily.        . simvastatin (ZOCOR) 80 MG tablet Take 80 mg by mouth daily.       [name] SPIRIVA HANDIHALER 18 MCG inhalation capsule Place 18 mcg into inhaler and inhale as needed. For congestion/breathing       No facility-administered encounter medications on file as of 12/23/2012.    No Known Allergies  Past Medical History  Diagnosis Date  . Hyperlipidemia   . Hypertension   . AAA  (abdominal aortic aneurysm) 01/2008    3.2cm  . Obesity   . Hypercholesterolemia   . Shortness of breath     due to cigarette abuse  . Obstructive sleep apnea on CPAP   . Tobacco abuse   . Normal nuclear stress test 2011  . Diabetes mellitus     type II  . BPH (benign prostatic hyperplasia)   . Gout   . Presence of indwelling urinary catheter   . COPD (chronic obstructive pulmonary disease)    ROS: Negative except as per HPI  BP 134/78  Pulse 66  Ht 5\' 10"  (1.778 m)  Wt 230 lb (104.327 kg)  BMI 33 kg/m2  PHYSICAL EXAM: Pt is alert and oriented, pleasant obese male in NAD HEENT: normal Neck: JVP - normal, carotids 2+= without bruits Lungs: CTA bilaterally CV: RRR without murmur or gallop Abd: soft, NT, Positive BS, obese Ext: Trace pretibial edema bilaterally Skin: warm/dry no rash  EKG:  Sinus rhythm 65 beats per minute, incomplete right bundle branch block.  ASSESSMENT AND PLAN: 1. Hypertension. Blood pressure is controlled on a combination of amlodipine, losartan, and metoprolol succinate. Refills were written today. I will see him back in one year.  2. Hyperlipidemia. The  patient is on amlodipine which interacts with simvastatin. I've asked him to reduce his simvastatin dose to 20 mg daily because of potentiation with amlodipine.  3. Abdominal aortic aneurysm. Followed by vascular surgery.  For followup I'll see him back in 12 months.  Tonny Bollman 12/23/2012 2:39 PM

## 2012-12-24 ENCOUNTER — Encounter: Payer: Self-pay | Admitting: Cardiovascular Disease

## 2013-01-20 ENCOUNTER — Ambulatory Visit (HOSPITAL_COMMUNITY)
Admission: RE | Admit: 2013-01-20 | Discharge: 2013-01-20 | Disposition: A | Discharge status: Home / Self Care | Payer: Medicare Other | Source: Ambulatory Visit | Attending: Pulmonary Disease | Admitting: Pulmonary Disease

## 2013-01-20 ENCOUNTER — Other Ambulatory Visit (HOSPITAL_COMMUNITY): Payer: Self-pay | Admitting: Pulmonary Disease

## 2013-01-20 DIAGNOSIS — J449 Chronic obstructive pulmonary disease, unspecified: Secondary | ICD-10-CM | POA: Insufficient documentation

## 2013-01-20 DIAGNOSIS — R0602 Shortness of breath: Secondary | ICD-10-CM | POA: Insufficient documentation

## 2013-01-20 DIAGNOSIS — J4489 Other specified chronic obstructive pulmonary disease: Secondary | ICD-10-CM | POA: Insufficient documentation

## 2013-01-20 LAB — PULMONARY FUNCTION TEST

## 2013-04-19 ENCOUNTER — Other Ambulatory Visit: Payer: Self-pay

## 2013-04-19 MED ORDER — LOSARTAN POTASSIUM 50 MG PO TABS: ORAL_TABLET | ORAL | Status: DC

## 2013-07-27 ENCOUNTER — Other Ambulatory Visit: Payer: Self-pay | Admitting: Surgery

## 2013-07-27 DIAGNOSIS — I714 Abdominal aortic aneurysm, without rupture, unspecified: Secondary | ICD-10-CM

## 2013-09-05 ENCOUNTER — Encounter: Payer: Self-pay | Admitting: Family

## 2013-09-06 ENCOUNTER — Inpatient Hospital Stay (HOSPITAL_COMMUNITY): Admission: RE | Admit: 2013-09-06 | Payer: Medicare Other | Source: Ambulatory Visit

## 2013-09-06 ENCOUNTER — Ambulatory Visit: Payer: Medicare Other | Admitting: Family

## 2013-09-28 ENCOUNTER — Encounter: Payer: Self-pay | Admitting: Family

## 2013-09-29 ENCOUNTER — Ambulatory Visit (HOSPITAL_COMMUNITY)
Admission: RE | Admit: 2013-09-29 | Discharge: 2013-09-29 | Disposition: A | Discharge status: Home / Self Care | Payer: Medicare HMO | Source: Ambulatory Visit | Attending: Family | Admitting: Family

## 2013-09-29 ENCOUNTER — Ambulatory Visit: Payer: Commercial Managed Care - HMO | Admitting: Family

## 2013-09-29 DIAGNOSIS — I714 Abdominal aortic aneurysm, without rupture, unspecified: Secondary | ICD-10-CM | POA: Insufficient documentation

## 2013-09-29 NOTE — Progress Notes (Signed)
Rescheduled to earliest availability  AAA Duplex since bowel gas obstructed any ability to view. Advised not to eat or smoke after midnight the night before the exam.  Also advised to take 2 extra strength Gas X 3 hours before the procedure, and again 1 hour before. His last year AAA size was in the 3.+ cm size range, and he denies abdominal or back pain referable to AAA.

## 2013-09-29 NOTE — Patient Instructions (Signed)

## 2013-10-03 ENCOUNTER — Other Ambulatory Visit: Payer: Self-pay | Admitting: *Deleted

## 2013-10-03 ENCOUNTER — Telehealth: Payer: Self-pay | Admitting: Pulmonary Disease

## 2013-10-03 DIAGNOSIS — I714 Abdominal aortic aneurysm, without rupture, unspecified: Secondary | ICD-10-CM

## 2013-10-03 NOTE — Telephone Encounter (Signed)
Spoke with pt's wife. She states that they will contact pt's PCP about orders. Nothing further was needed.

## 2013-10-03 NOTE — Telephone Encounter (Signed)
Received request from [facility] for new script for CPAP supplies.  Pt has not been seen in pulmonary office for several years.  Will have my nurse inform pt that he will need ROV first before having supplies renewed for CPAP, or he will need to have his primary care physician sign orders for new CPAP supplies.

## 2013-10-19 ENCOUNTER — Encounter: Payer: Self-pay | Admitting: Family

## 2013-10-20 ENCOUNTER — Encounter: Payer: Self-pay | Admitting: Family

## 2013-10-20 ENCOUNTER — Ambulatory Visit (INDEPENDENT_AMBULATORY_CARE_PROVIDER_SITE_OTHER): Payer: Medicare HMO | Admitting: Family

## 2013-10-20 ENCOUNTER — Ambulatory Visit (HOSPITAL_COMMUNITY)
Admission: RE | Admit: 2013-10-20 | Discharge: 2013-10-20 | Disposition: A | Discharge status: Home / Self Care | Payer: Medicare HMO | Source: Ambulatory Visit | Attending: Family | Admitting: Family

## 2013-10-20 VITALS — BP 139/83 | HR 54 | Resp 14 | Ht 70.5 in | Wt 231.0 lb

## 2013-10-20 DIAGNOSIS — I714 Abdominal aortic aneurysm, without rupture, unspecified: Secondary | ICD-10-CM

## 2013-10-20 NOTE — Progress Notes (Signed)
[facility] [place]  Established Abdominal Aortic Aneurysm  History of Present Illness  Kevin Frank is a 73 y.o. (1941-04-18) male patient of Dr. Trula Slade. The patient comes back today for followup of his infrarenal abdominal aortic aneurysm. This was first detected by MRI and 2008 for back pain. He continues to be asymptomatic. He denies abdominal pain.  The patient continues to be medically managed for his diabetes.  Previous studies demonstrate an AAA, measuring 3.75 cm.  The patient does not have back or abdominal pain.  The patient is a smoker. The patient denies claudication in legs with walking. The patient denies history of stroke or TIA symptoms. He states he is very physically active, and drinks less than 14 drinks/week.  Pt Diabetic: Yes, states his last A1C was 6.? Pt smoker: smoker  (2 ppd, started smoking in his teens) Pt denies any adverse reaction to ASA, denies history of GI bleed or nose bleeds.  Past Medical History  Diagnosis Date  . Hyperlipidemia   . Hypertension   . AAA (abdominal aortic aneurysm) 01/2008    3.2cm  . Obesity   . Hypercholesterolemia   . Shortness of breath     due to cigarette abuse  . Obstructive sleep apnea on CPAP   . Tobacco abuse   . Normal nuclear stress test 2011  . Diabetes mellitus     type II  . BPH (benign prostatic hyperplasia)   . Gout   . Presence of indwelling urinary catheter   . COPD (chronic obstructive pulmonary disease)    Past Surgical History  Procedure Laterality Date  . Cholecystectomy  1997    [facility]  . Transurethral resection of prostate  01/07/2011    Procedure: TRANSURETHRAL RESECTION OF THE PROSTATE (TURP);  Surgeon: Marissa Nestle;  Location: [facility];  Service: Urology;  Laterality: N/A;  . Cholecystectomy  1989   Social History History   Social History  . Marital Status: Married    Spouse Name: N/A    Number of Children: N/A  . Years of Education: N/A   Occupational  History  . Not on file.   Social History Main Topics  . Smoking status: Current Every Day Smoker -- 2.00 packs/day for 50 years    Types: Cigarettes  . Smokeless tobacco: Never Used  . Alcohol Use: Yes     Comment: occasional  . Drug Use: No  . Sexual Activity: Not on file   Other Topics Concern  . Not on file   Social History Narrative  . No narrative on file   Family History Family History  Problem Relation Age of Onset  . Liver disease Father   . Lymphoma Mother   . Diabetes Mother   . Hypertension Mother   . Cancer Mother   . Mental retardation      sibling -- from birth     Current Outpatient Prescriptions on File Prior to Visit  Medication Sig Dispense Refill  . albuterol (PROVENTIL HFA;VENTOLIN HFA) 108 (90 BASE) MCG/ACT inhaler Inhale 2 puffs into the lungs every 6 (six) hours as needed.      [name] allopurinol (ZYLOPRIM) 300 MG tablet Take 300 mg by mouth daily.        [name] amLODipine (NORVASC) 10 MG tablet Take 1 tablet (10 mg total) by mouth daily.  30 tablet  11  . beclomethasone (QVAR) 80 MCG/ACT inhaler Inhale 1 puff into the lungs as needed.      . furosemide (  LASIX) 40 MG tablet Take 40 mg by mouth daily.        [name] losartan (COZAAR) 50 MG tablet TAKE 1 TABLET DAILY.  30 tablet  6  . metFORMIN (GLUCOPHAGE) 500 MG tablet Take 500 mg by mouth 2 (two) times daily with a meal.        . metoprolol succinate (TOPROL-XL) 25 MG 24 hr tablet Take 1 tablet (25 mg total) by mouth daily.  30 tablet  11  . sertraline (ZOLOFT) 50 MG tablet Take 50 mg by mouth daily.        . simvastatin (ZOCOR) 80 MG tablet Take 1/4 tablet by mouth daily--contact Dr Kathaleen Grinder office to discuss cholesterol medicine      . SPIRIVA HANDIHALER 18 MCG inhalation capsule Place 18 mcg into inhaler and inhale as needed. For congestion/breathing       No current facility-administered medications on file prior to visit.   No Known Allergies  ROS: See HPI for pertinent positives and negatives.  Physical  Examination  Filed Vitals:   10/20/13 0912  BP: 139/83  Pulse: 54  Resp: 14  Height: 5' 10.5" (1.791 m)  Weight: 231 lb (104.781 kg)  SpO2: 96%   Body mass index is 32.67 kg/(m^2).  General: A&O x 3, WD, obese male.  Pulmonary: Sym exp, decreased air movt, CTAB, no rales, rhonchi, or wheezing, chronic dry cough.  Cardiac: RRR, Nl S1, S2, no detected murmur.   Carotid Bruits Left Right   Negative Negative   Aorta is not palpable Radial pulses are 2+ palpable and =.                          VASCULAR EXAM:                                                                                                         LE Pulses LEFT RIGHT       POPLITEAL  not palpable   not palpable       POSTERIOR TIBIAL  1+ palpable   2+ palpable        DORSALIS PEDIS      ANTERIOR TIBIAL 3+ palpable  2+ palpable      Gastrointestinal: soft, NTND, -G/R, - HSM, - masses, - CVAT B, large panus.  Musculoskeletal: M/S 5/5 throughout, Extremities without ischemic changes  Neurologic: CN 2-12 intact except some hearing loss, Pain and light touch intact in extremities are intact except, Motor exam as listed above.  Non-Invasive Vascular Imaging  AAA Duplex (10/20/2013)  Previous size: 3.75 cm (Date: 08/30/2012)  Current size:  4.3 cm (Date: 10/20/2013)  Medical Decision Making  The patient is a 73 y.o. male who presents with asymptomatic AAA with slight increase in size. His risk factors for aneurysmal growth are heavy long term smoking, controlled DM, obesity, and medically managed hypertension and dyslipidemia. He was counseled re smoking cessation.  Add 81 mg daily ASA.   Based on this patient's exam and diagnostic studies, the patient will follow up in  1 year  with the following studies: AAA Duplex.  Consideration for repair of AAA would be made when the size approaches 4.8 or 5.0 cm, growth > 1 cm/yr, and symptomatic status.  I emphasized the importance of maximal medical  management including strict control of blood pressure, blood glucose, and lipid levels, antiplatelet agents, obtaining regular exercise, and  cessation of smoking.   The patient was given information about AAA including signs, symptoms, treatment, and how to minimize the risk of enlargement and rupture of aneurysms.    The patient was advised to call [phone] should the patient experience sudden onset abdominal or back pain.   Thank you for allowing [place] to participate in this patient's care.  Clemon Chambers, RN, MSN, FNP-C [facility] [place] Office: [facility] Physician: Oneida Alar  10/20/2013, 9:22 AM

## 2013-10-20 NOTE — Patient Instructions (Signed)
Abdominal Aortic Aneurysm An aneurysm is a weakened or damaged part of an artery wall that bulges from the normal force of blood pumping through the body. An abdominal aortic aneurysm is an aneurysm that occurs in the lower part of the aorta, the main artery of the body.  The major concern with an abdominal aortic aneurysm is that it can enlarge and burst (rupture) or blood can flow between the layers of the wall of the aorta through a tear (aorticdissection). Both of these conditions can cause bleeding inside the body and can be life threatening unless diagnosed and treated promptly. CAUSES  The exact cause of an abdominal aortic aneurysm is unknown. Some contributing factors are:   A hardening of the arteries caused by the buildup of fat and other substances in the lining of a blood vessel (arteriosclerosis).  Inflammation of the walls of an artery (arteritis).   Connective tissue diseases, such as Marfan syndrome.   Abdominal trauma.   An infection, such as syphilis or staphylococcus, in the wall of the aorta (infectious aortitis) caused by bacteria. RISK FACTORS  Risk factors that contribute to an abdominal aortic aneurysm may include:  Age older than 60 years.   High blood pressure (hypertension).  Male gender.  Ethnicity (white race).  Obesity.  Family history of aneurysm (first degree relatives only).  Tobacco use. PREVENTION  The following healthy lifestyle habits may help decrease your risk of abdominal aortic aneurysm:  Quitting smoking. Smoking can raise your blood pressure and cause arteriosclerosis.  Limiting or avoiding alcohol.  Keeping your blood pressure, blood sugar level, and cholesterol levels within normal limits.  Decreasing your salt intake. In somepeople, too much salt can raise blood pressure and increase your risk of abdominal aortic aneurysm.  Eating a diet low in saturated fats and cholesterol.  Increasing your fiber intake by including  whole grains, vegetables, and fruits in your diet. Eating these foods may help lower blood pressure.  Maintaining a healthy weight.  Staying physically active and exercising regularly. SYMPTOMS  The symptoms of abdominal aortic aneurysm may vary depending on the size and rate of growth of the aneurysm.Most grow slowly and do not have any symptoms. When symptoms do occur, they may include:  Pain (abdomen, side, lower back, or groin). The pain may vary in intensity. A sudden onset of severe pain may indicate that the aneurysm has ruptured.  Feeling full after eating only small amounts of food.  Nausea or vomiting or both.  Feeling a pulsating lump in the abdomen.  Feeling faint or passing out. DIAGNOSIS  Since most unruptured abdominal aortic aneurysms have no symptoms, they are often discovered during diagnostic exams for other conditions. An aneurysm may be found during the following procedures:  Ultrasonography (A one-time screening for abdominal aortic aneurysm by ultrasonography is also recommended for all men aged 65-75 years who have ever smoked).  X-ray exams.  A computed tomography (CT).  Magnetic resonance imaging (MRI).  Angiography or arteriography. TREATMENT  Treatment of an abdominal aortic aneurysm depends on the size of your aneurysm, your age, and risk factors for rupture. Medication to control blood pressure and pain may be used to manage aneurysms smaller than 6 cm. Regular monitoring for enlargement may be recommended by your caregiver if:  The aneurysm is 3 4 cm in size (an annual ultrasonography may be recommended).  The aneurysm is 4 4.5 cm in size (an ultrasonography every 6 months may be recommended).  The aneurysm is larger than 4.5   cm in size (your caregiver may ask that you be examined by a vascular surgeon). If your aneurysm is larger than 6 cm, surgical repair may be recommended. There are two main methods for repair of an aneurysm:   Endovascular  repair (a minimally invasive surgery). This is done most often.  Open repair. This method is used if an endovascular repair is not possible. Document Released: 03/19/2005 Document Revised: 10/04/2012 Document Reviewed: 07/09/2012 ExitCare Patient Information 2014 [employer].   Smoking Cessation Quitting smoking is important to your health and has many advantages. However, it is not always easy to quit since nicotine is a very addictive drug. Often times, people try 3 times or more before being able to quit. This document explains the best ways for you to prepare to quit smoking. Quitting takes hard work and a lot of effort, but you can do it. ADVANTAGES OF QUITTING SMOKING  You will live longer, feel better, and live better.  Your body will feel the impact of quitting smoking almost immediately.  Within 20 minutes, blood pressure decreases. Your pulse returns to its normal level.  After 8 hours, carbon monoxide levels in the blood return to normal. Your oxygen level increases.  After 24 hours, the chance of having a heart attack starts to decrease. Your breath, hair, and body stop smelling like smoke.  After 48 hours, damaged nerve endings begin to recover. Your sense of taste and smell improve.  After 72 hours, the body is virtually free of nicotine. Your bronchial tubes relax and breathing becomes easier.  After 2 to 12 weeks, lungs can hold more air. Exercise becomes easier and circulation improves.  The risk of having a heart attack, stroke, cancer, or lung disease is greatly reduced.  After 1 year, the risk of coronary heart disease is cut in half.  After 5 years, the risk of stroke falls to the same as a nonsmoker.  After 10 years, the risk of lung cancer is cut in half and the risk of other cancers decreases significantly.  After 15 years, the risk of coronary heart disease drops, usually to the level of a nonsmoker.  If you are pregnant, quitting smoking will improve  your chances of having a healthy baby.  The people you live with, especially any children, will be healthier.  You will have extra money to spend on things other than cigarettes. QUESTIONS TO THINK ABOUT BEFORE ATTEMPTING TO QUIT You may want to talk about your answers with your caregiver.  Why do you want to quit?  If you tried to quit in the past, what helped and what did not?  What will be the most difficult situations for you after you quit? How will you plan to handle them?  Who can help you through the tough times? Your family? Friends? A caregiver?  What pleasures do you get from smoking? What ways can you still get pleasure if you quit? Here are some questions to ask your caregiver:  How can you help me to be successful at quitting?  What medicine do you think would be best for me and how should I take it?  What should I do if I need more help?  What is smoking withdrawal like? How can I get information on withdrawal? GET READY  Set a quit date.  Change your environment by getting rid of all cigarettes, ashtrays, matches, and lighters in your home, car, or work. Do not let people smoke in your home.  Review your   past attempts to quit. Think about what worked and what did not. GET SUPPORT AND ENCOURAGEMENT You have a better chance of being successful if you have help. You can get support in many ways.  Tell your family, friends, and co-workers that you are going to quit and need their support. Ask them not to smoke around you.  Get individual, group, or telephone counseling and support. Programs are available at local hospitals and health centers. Call your local health department for information about programs in your area.  Spiritual beliefs and practices may help some smokers quit.  Download a "quit meter" on your computer to keep track of quit statistics, such as how long you have gone without smoking, cigarettes not smoked, and money saved.  Get a self-help  book about quitting smoking and staying off of tobacco. LEARN NEW SKILLS AND BEHAVIORS  Distract yourself from urges to smoke. Talk to someone, go for a walk, or occupy your time with a task.  Change your normal routine. Take a different route to work. Drink tea instead of coffee. Eat breakfast in a different place.  Reduce your stress. Take a hot bath, exercise, or read a book.  Plan something enjoyable to do every day. Reward yourself for not smoking.  Explore interactive web-based programs that specialize in helping you quit. GET MEDICINE AND USE IT CORRECTLY Medicines can help you stop smoking and decrease the urge to smoke. Combining medicine with the above behavioral methods and support can greatly increase your chances of successfully quitting smoking.  Nicotine replacement therapy helps deliver nicotine to your body without the negative effects and risks of smoking. Nicotine replacement therapy includes nicotine gum, lozenges, inhalers, nasal sprays, and skin patches. Some may be available over-the-counter and others require a prescription.  Antidepressant medicine helps people abstain from smoking, but how this works is unknown. This medicine is available by prescription.  Nicotinic receptor partial agonist medicine simulates the effect of nicotine in your brain. This medicine is available by prescription. Ask your caregiver for advice about which medicines to use and how to use them based on your health history. Your caregiver will tell you what side effects to look out for if you choose to be on a medicine or therapy. Carefully read the information on the package. Do not use any other product containing nicotine while using a nicotine replacement product.  RELAPSE OR DIFFICULT SITUATIONS Most relapses occur within the first 3 months after quitting. Do not be discouraged if you start smoking again. Remember, most people try several times before finally quitting. You may have symptoms  of withdrawal because your body is used to nicotine. You may crave cigarettes, be irritable, feel very hungry, cough often, get headaches, or have difficulty concentrating. The withdrawal symptoms are only temporary. They are strongest when you first quit, but they will go away within 10 14 days. To reduce the chances of relapse, try to:  Avoid drinking alcohol. Drinking lowers your chances of successfully quitting.  Reduce the amount of caffeine you consume. Once you quit smoking, the amount of caffeine in your body increases and can give you symptoms, such as a rapid heartbeat, sweating, and anxiety.  Avoid smokers because they can make you want to smoke.  Do not let weight gain distract you. Many smokers will gain weight when they quit, usually less than 10 pounds. Eat a healthy diet and stay active. You can always lose the weight gained after you quit.  Find ways to improve your   mood other than smoking. FOR MORE INFORMATION  www.smokefree.gov  Document Released: 06/03/2001 Document Revised: 12/09/2011 Document Reviewed: 09/18/2011 ExitCare Patient Information 2014 [employer].  

## 2013-12-26 ENCOUNTER — Other Ambulatory Visit: Payer: Self-pay | Admitting: Cardiovascular Disease

## 2013-12-29 NOTE — Telephone Encounter (Signed)
This encounter was created in error - please disregard.

## 2013-12-30 ENCOUNTER — Ambulatory Visit (INDEPENDENT_AMBULATORY_CARE_PROVIDER_SITE_OTHER): Payer: Commercial Managed Care - HMO | Admitting: Cardiovascular Disease

## 2013-12-30 ENCOUNTER — Encounter: Payer: Self-pay | Admitting: Cardiovascular Disease

## 2013-12-30 VITALS — BP 144/78 | HR 68 | Ht 70.0 in | Wt 227.0 lb

## 2013-12-30 DIAGNOSIS — I10 Essential (primary) hypertension: Secondary | ICD-10-CM

## 2013-12-30 NOTE — Progress Notes (Signed)
HPI:  73 year old gentleman presenting for followup evaluation. The patient is followed for hypertension, hyperlipidemia, tobacco abuse, and a small abdominal aortic aneurysm. He is followed by vascular surgery. At the time of his last evaluation by vascular surgery in April of this year, his abdominal aortic aneurysm had increased from 3.8 cm to 4.3 cm over the previous year. He is scheduled to return in one year for followup imaging. He has been asymptomatic.  The patient is doing okay. He denies chest pain or pressure. He denies leg swelling, orthopnea, or PND. He does admit to shortness of breath with exertion and this is long-standing. He does not appreciate any change in his symptoms. He is a long-time 2 pack per day smoker since he was a young man. He is not able to quit, nor does he desire much counseling to do so.    Outpatient Encounter Prescriptions as of 12/30/2013  Medication Sig  . albuterol (PROVENTIL HFA;VENTOLIN HFA) 108 (90 BASE) MCG/ACT inhaler Inhale 2 puffs into the lungs every 6 (six) hours as needed.  [name] allopurinol (ZYLOPRIM) 300 MG tablet Take 300 mg by mouth daily.    [name] amLODipine (NORVASC) 10 MG tablet TAKE 1 TABLET DAILY.  [name] atorvastatin (LIPITOR) 20 MG tablet   . beclomethasone (QVAR) 80 MCG/ACT inhaler Inhale 1 puff into the lungs as needed.  . doxycycline (VIBRAMYCIN) 100 MG capsule   . furosemide (LASIX) 40 MG tablet Take 40 mg by mouth daily.    [name] losartan (COZAAR) 50 MG tablet TAKE 1 TABLET DAILY.  . metFORMIN (GLUCOPHAGE) 500 MG tablet Take 500 mg by mouth 2 (two) times daily with a meal.    . metoprolol succinate (TOPROL-XL) 25 MG 24 hr tablet TAKE 1 TABLET DAILY.  [name] sertraline (ZOLOFT) 50 MG tablet Take 50 mg by mouth daily.    . simvastatin (ZOCOR) 80 MG tablet Take 1/4 tablet by mouth daily--contact Dr Kathaleen Grinder office to discuss cholesterol medicine  . SPIRIVA HANDIHALER 18 MCG inhalation capsule Place 18 mcg into inhaler and inhale as needed. For  congestion/breathing  . [DISCONTINUED] amLODipine (NORVASC) 10 MG tablet Take 1 tablet (10 mg total) by mouth daily.  . [DISCONTINUED] metoprolol succinate (TOPROL-XL) 25 MG 24 hr tablet Take 1 tablet (25 mg total) by mouth daily.    No Known Allergies  Past Medical History  Diagnosis Date  . Hyperlipidemia   . Hypertension   . AAA (abdominal aortic aneurysm) 01/2008    3.2cm  . Obesity   . Hypercholesterolemia   . Shortness of breath     due to cigarette abuse  . Obstructive sleep apnea on CPAP   . Tobacco abuse   . Normal nuclear stress test 2011  . Diabetes mellitus     type II  . BPH (benign prostatic hyperplasia)   . Gout   . Presence of indwelling urinary catheter   . COPD (chronic obstructive pulmonary disease)     ROS: Negative except as per HPI  BP 144/78  Pulse 68  Ht 5\' 10"  (1.778 m)  Wt 102.967 kg (227 lb)  BMI 32.57 kg/m2  PHYSICAL EXAM: Pt is alert and oriented, pleasant obese male in NAD HEENT: normal Neck: JVP - normal, carotids 2+= without bruits Lungs: CTA bilaterally CV: RRR without murmur or gallop Abd: soft, NT, Positive BS, obese Ext: no C/C/E, distal pulses intact and equal Skin: warm/dry no rash  EKG:  Normal sinus rhythm 68 beats per minute, left anterior fascicular block.  ASSESSMENT  AND PLAN: 1. Hypertension. The patient will continue on amlodipine, losartan, and metoprolol succinate.  2. Hyperlipidemia. Lipids are followed by Dr. Luan Pulling. He remains on statin therapy.  3. Abdominal aortic aneurysm. His aneurysm has enlarged. He is due for one year followup in May 2016 by vascular surgery. He understands the implications of continued tobacco use.  I will see him back in one year for followup.  Sherren Mocha 12/30/2013 10:24 AM

## 2013-12-30 NOTE — Patient Instructions (Signed)
Your physician recommends that you continue on your current medications as directed. Please refer to the Current Medication list given to you today.  Your physician wants you to follow-up in: 1 year ov You will receive a reminder letter in the mail two months in advance. If you don't receive a letter, please call our office to schedule the follow-up appointment.  

## 2014-01-17 ENCOUNTER — Other Ambulatory Visit: Payer: Self-pay | Admitting: Cardiovascular Disease

## 2014-03-02 ENCOUNTER — Other Ambulatory Visit: Payer: Self-pay | Admitting: Cardiovascular Disease

## 2014-04-28 ENCOUNTER — Other Ambulatory Visit: Payer: Self-pay | Admitting: Cardiovascular Disease

## 2014-04-28 MED ORDER — LOSARTAN POTASSIUM 50 MG PO TABS: ORAL_TABLET | ORAL | Status: DC

## 2014-08-24 DIAGNOSIS — E119 Type 2 diabetes mellitus without complications: Secondary | ICD-10-CM | POA: Diagnosis not present

## 2014-08-24 DIAGNOSIS — M25561 Pain in right knee: Secondary | ICD-10-CM | POA: Diagnosis not present

## 2014-08-24 DIAGNOSIS — I10 Essential (primary) hypertension: Secondary | ICD-10-CM | POA: Diagnosis not present

## 2014-08-24 DIAGNOSIS — J449 Chronic obstructive pulmonary disease, unspecified: Secondary | ICD-10-CM | POA: Diagnosis not present

## 2014-08-25 ENCOUNTER — Ambulatory Visit (HOSPITAL_COMMUNITY)
Admission: RE | Admit: 2014-08-25 | Discharge: 2014-08-25 | Disposition: A | Discharge status: Home / Self Care | Payer: Commercial Managed Care - HMO | Source: Ambulatory Visit | Attending: Pulmonary Disease | Admitting: Pulmonary Disease

## 2014-08-25 ENCOUNTER — Other Ambulatory Visit (HOSPITAL_COMMUNITY): Payer: Self-pay | Admitting: Pulmonary Disease

## 2014-08-25 DIAGNOSIS — M25561 Pain in right knee: Secondary | ICD-10-CM | POA: Insufficient documentation

## 2014-08-25 DIAGNOSIS — I10 Essential (primary) hypertension: Secondary | ICD-10-CM | POA: Diagnosis not present

## 2014-08-25 DIAGNOSIS — E119 Type 2 diabetes mellitus without complications: Secondary | ICD-10-CM | POA: Diagnosis not present

## 2014-08-25 DIAGNOSIS — S8991XA Unspecified injury of right lower leg, initial encounter: Secondary | ICD-10-CM | POA: Diagnosis not present

## 2014-08-25 DIAGNOSIS — J449 Chronic obstructive pulmonary disease, unspecified: Secondary | ICD-10-CM | POA: Diagnosis not present

## 2014-08-30 DIAGNOSIS — I10 Essential (primary) hypertension: Secondary | ICD-10-CM | POA: Diagnosis not present

## 2014-08-30 DIAGNOSIS — F17200 Nicotine dependence, unspecified, uncomplicated: Secondary | ICD-10-CM | POA: Diagnosis not present

## 2014-08-30 DIAGNOSIS — J449 Chronic obstructive pulmonary disease, unspecified: Secondary | ICD-10-CM | POA: Diagnosis not present

## 2014-08-30 DIAGNOSIS — E119 Type 2 diabetes mellitus without complications: Secondary | ICD-10-CM | POA: Diagnosis not present

## 2014-09-12 ENCOUNTER — Encounter: Payer: Self-pay | Admitting: Orthopedic Surgery

## 2014-09-12 ENCOUNTER — Ambulatory Visit (INDEPENDENT_AMBULATORY_CARE_PROVIDER_SITE_OTHER): Payer: Commercial Managed Care - HMO | Admitting: Orthopedic Surgery

## 2014-09-12 VITALS — BP 125/59 | Ht 70.0 in | Wt 230.0 lb

## 2014-09-12 DIAGNOSIS — M1711 Unilateral primary osteoarthritis, right knee: Secondary | ICD-10-CM

## 2014-09-12 NOTE — Progress Notes (Signed)
Patient ID: Kevin Frank, male   DOB: 05-06-1941, 74 y.o.   MRN: [id number]  Chief Complaint  Patient presents with  . Knee Pain    Right knee pain, no known injury, REF. HAWKINS     Kevin Frank is a 74 y.o. male.   HPI Male 6 months of right knee pain stiffness and difficulty getting out of a chair is exacerbated by a misstep while putting in a double wide recently complains a 6 out of 10 medial knee pain relieved by Advil worsened by ambulation but currently not symptomatic Review of Systems Hearing loss cough stiff joints hayfever  Past Medical History  Diagnosis Date  . Hyperlipidemia   . Hypertension   . AAA (abdominal aortic aneurysm) 01/2008    3.2cm  . Obesity   . Hypercholesterolemia   . Shortness of breath     due to cigarette abuse  . Obstructive sleep apnea on CPAP   . Tobacco abuse   . Normal nuclear stress test 2011  . Diabetes mellitus     type II  . BPH (benign prostatic hyperplasia)   . Gout   . Presence of indwelling urinary catheter   . COPD (chronic obstructive pulmonary disease)     Past Surgical History  Procedure Laterality Date  . Cholecystectomy  1997    [facility]  . Transurethral resection of prostate  01/07/2011    Procedure: TRANSURETHRAL RESECTION OF THE PROSTATE (TURP);  Surgeon: Marissa Nestle;  Location: [facility];  Service: Urology;  Laterality: N/A;  . Cholecystectomy  1989    Family History  Problem Relation Age of Onset  . Liver disease Father   . Lymphoma Mother   . Diabetes Mother   . Hypertension Mother   . Cancer Mother   . Mental retardation      sibling -- from birth     Social History History  Substance Use Topics  . Smoking status: Current Every Day Smoker -- 2.00 packs/day for 50 years    Types: Cigarettes  . Smokeless tobacco: Never Used  . Alcohol Use: Yes     Comment: occasional    No Known Allergies  Current Outpatient Prescriptions  Medication Sig Dispense Refill  . allopurinol (ZYLOPRIM) 300 MG  tablet Take 300 mg by mouth daily.      [name] amLODipine (NORVASC) 10 MG tablet TAKE 1 TABLET ONCE DAILY. 30 tablet 10  . atorvastatin (LIPITOR) 20 MG tablet     . furosemide (LASIX) 40 MG tablet Take 40 mg by mouth daily.      [name] losartan (COZAAR) 50 MG tablet TAKE 1 TABLET DAILY. 180 tablet 1  . metFORMIN (GLUCOPHAGE) 500 MG tablet Take 500 mg by mouth 2 (two) times daily with a meal.      . metoprolol succinate (TOPROL-XL) 25 MG 24 hr tablet TAKE 1 TABLET ONCE DAILY. 30 tablet 10  . sertraline (ZOLOFT) 50 MG tablet Take 50 mg by mouth daily.      [name] SPIRIVA HANDIHALER 18 MCG inhalation capsule Place 18 mcg into inhaler and inhale as needed. For congestion/breathing    . albuterol (PROVENTIL HFA;VENTOLIN HFA) 108 (90 BASE) MCG/ACT inhaler Inhale 2 puffs into the lungs every 6 (six) hours as needed.    . beclomethasone (QVAR) 80 MCG/ACT inhaler Inhale 1 puff into the lungs as needed.    . simvastatin (ZOCOR) 80 MG tablet Take 1/4 tablet by mouth daily--contact Dr Kathaleen Grinder office to discuss cholesterol medicine  No current facility-administered medications for this visit.       Physical Exam Blood pressure 125/59, height 5\' 10"  (1.778 m), weight 230 lb (104.327 kg). Physical Exam The patient is well developed well nourished and well groomed. Orientation to person place and time is normal  Mood is pleasant. Ambulatory status normal no cane or walker No tenderness to palpation today. No effusion., Range of motion arc is 120, stability tests were normal. Muscle tone normal. Skin normal. Distally he had no edema some mild venous congestion skin changes no open ulcers. Noted both legs. Sensation normal both legs. Balance normal.  Data Reviewed I independently interpret his hospital film as osteoarthritis with 50% loss of cartilage medially.  Assessment Encounter Diagnosis  Name Primary?  . Primary osteoarthritis of knee, right Yes    Plan Recommend observation at this time no evidence  of surgical intervention necessary.

## 2014-09-19 DIAGNOSIS — J449 Chronic obstructive pulmonary disease, unspecified: Secondary | ICD-10-CM | POA: Diagnosis not present

## 2014-09-19 DIAGNOSIS — J309 Allergic rhinitis, unspecified: Secondary | ICD-10-CM | POA: Diagnosis not present

## 2014-10-03 DIAGNOSIS — H5210 Myopia, unspecified eye: Secondary | ICD-10-CM | POA: Diagnosis not present

## 2014-10-03 DIAGNOSIS — H524 Presbyopia: Secondary | ICD-10-CM | POA: Diagnosis not present

## 2014-10-17 ENCOUNTER — Telehealth (HOSPITAL_COMMUNITY): Payer: Self-pay | Admitting: *Deleted

## 2014-10-17 NOTE — Telephone Encounter (Signed)
I returned a call to Payton Emerald on behalf of Mosie Angus on 4/22, 2016. I let her know that I would contact our billing department regarding the message she left with Debbie from [facility] that was forwarded to me.  I sent an email to Christen Bame in our billing department on 10/16/2014 to ask her to cancel the charges for the exam done in our department on 09/29/2013 and to only bill the exam done on 10/20/2013.  The exam done on 10/05/2013 was nondiagnostic.    I have made three attempts to update [name] or Cameran Pettey and have been unable to reach them by phone.  Their voice message recording does not identify the number as belonging to them so therefore no message was left.  Nafisah Runions, RVT, RDCS Chief [facility]

## 2014-10-25 ENCOUNTER — Encounter: Payer: Self-pay | Admitting: Family

## 2014-10-26 ENCOUNTER — Inpatient Hospital Stay (HOSPITAL_COMMUNITY): Admission: RE | Admit: 2014-10-26 | Payer: Commercial Managed Care - HMO | Source: Ambulatory Visit

## 2014-10-26 ENCOUNTER — Ambulatory Visit: Payer: Commercial Managed Care - HMO | Admitting: Family

## 2014-11-21 ENCOUNTER — Encounter: Payer: Self-pay | Admitting: Family

## 2014-11-23 ENCOUNTER — Ambulatory Visit (HOSPITAL_COMMUNITY)
Admission: RE | Admit: 2014-11-23 | Discharge: 2014-11-23 | Disposition: A | Discharge status: Home / Self Care | Payer: Commercial Managed Care - HMO | Source: Ambulatory Visit | Attending: Family | Admitting: Family

## 2014-11-23 ENCOUNTER — Ambulatory Visit (INDEPENDENT_AMBULATORY_CARE_PROVIDER_SITE_OTHER): Payer: Commercial Managed Care - HMO | Admitting: Family

## 2014-11-23 ENCOUNTER — Encounter: Payer: Self-pay | Admitting: Family

## 2014-11-23 VITALS — BP 159/84 | HR 49 | Resp 14 | Ht 70.0 in | Wt 229.0 lb

## 2014-11-23 DIAGNOSIS — I714 Abdominal aortic aneurysm, without rupture, unspecified: Secondary | ICD-10-CM

## 2014-11-23 DIAGNOSIS — Z720 Tobacco use: Secondary | ICD-10-CM | POA: Diagnosis not present

## 2014-11-23 DIAGNOSIS — F17200 Nicotine dependence, unspecified, uncomplicated: Secondary | ICD-10-CM

## 2014-11-23 DIAGNOSIS — E669 Obesity, unspecified: Secondary | ICD-10-CM | POA: Diagnosis not present

## 2014-11-23 NOTE — Progress Notes (Signed)
[facility] [place]  Established Abdominal Aortic Aneurysm  History of Present Illness  Kevin Frank is a 74 y.o. ([birth year]) male patient of Dr. Trula Slade. The patient comes back today for followup of his infrarenal abdominal aortic aneurysm. This was first detected by MRI in 2008 for back pain.   He denies abdominal pain, denies back pain.  The patient continues to be medically managed for his diabetes.  Previous studies demonstrate an AAA, measuring 3.75 cm. The patient is a smoker. The patient denies claudication in legs with walking. The patient denies history of stroke or TIA symptoms. He states he is very physically active, and drinks less than 14 ETOH drinks/week.  He states cartilage in both knees are about gone, reports that he saw an orthopod, but he seems to indicate moderate pain in both knees; "my biggest problem is getting up out of a chair".   Pt Diabetic: Yes, states his last A1C was 6.? Pt smoker: smoker (2-3 ppd, started smoking in his teens) Pt denies any adverse reaction to ASA, denies history of GI bleed or nose bleeds.   Past Medical History  Diagnosis Date  . Hyperlipidemia   . Hypertension   . AAA (abdominal aortic aneurysm) 01/2008    3.2cm  . Obesity   . Hypercholesterolemia   . Shortness of breath     due to cigarette abuse  . Obstructive sleep apnea on CPAP   . Tobacco abuse   . Normal nuclear stress test 2011  . Diabetes mellitus     type II  . BPH (benign prostatic hyperplasia)   . Gout   . Presence of indwelling urinary catheter   . COPD (chronic obstructive pulmonary disease)    Past Surgical History  Procedure Laterality Date  . Cholecystectomy  1997    [facility]  . Transurethral resection of prostate  01/07/2011    Procedure: TRANSURETHRAL RESECTION OF THE PROSTATE (TURP);  Surgeon: Marissa Nestle;  Location: [facility];  Service: Urology;  Laterality: N/A;  . Cholecystectomy  1989   Social History History    Social History  . Marital Status: Married    Spouse Name: N/A  . Number of Children: N/A  . Years of Education: N/A   Occupational History  . Not on file.   Social History Main Topics  . Smoking status: Current Every Day Smoker -- 2.00 packs/day for 50 years    Types: Cigarettes  . Smokeless tobacco: Never Used  . Alcohol Use: Yes     Comment: occasional  . Drug Use: No  . Sexual Activity: Not on file   Other Topics Concern  . Not on file   Social History Narrative   Family History Family History  Problem Relation Age of Onset  . Liver disease Father   . Lymphoma Mother   . Diabetes Mother   . Hypertension Mother   . Cancer Mother   . Mental retardation      sibling -- from birth     Current Outpatient Prescriptions on File Prior to Visit  Medication Sig Dispense Refill  . allopurinol (ZYLOPRIM) 300 MG tablet Take 300 mg by mouth daily.      [name] SPIRIVA HANDIHALER 18 MCG inhalation capsule Place 18 mcg into inhaler and inhale as needed. For congestion/breathing    . albuterol (PROVENTIL HFA;VENTOLIN HFA) 108 (90 BASE) MCG/ACT inhaler Inhale 2 puffs into the lungs every 6 (six) hours as needed.    [name] amLODipine (NORVASC) 10 MG  tablet TAKE 1 TABLET ONCE DAILY. 30 tablet 10  . atorvastatin (LIPITOR) 20 MG tablet     . beclomethasone (QVAR) 80 MCG/ACT inhaler Inhale 1 puff into the lungs as needed.    . furosemide (LASIX) 40 MG tablet Take 40 mg by mouth daily.      [name] losartan (COZAAR) 50 MG tablet TAKE 1 TABLET DAILY. 180 tablet 1  . metFORMIN (GLUCOPHAGE) 500 MG tablet Take 500 mg by mouth 2 (two) times daily with a meal.      . metoprolol succinate (TOPROL-XL) 25 MG 24 hr tablet TAKE 1 TABLET ONCE DAILY. 30 tablet 10  . sertraline (ZOLOFT) 50 MG tablet Take 50 mg by mouth daily.      . simvastatin (ZOCOR) 80 MG tablet Take 1/4 tablet by mouth daily--contact Dr Kathaleen Grinder office to discuss cholesterol medicine     No current facility-administered medications on file  prior to visit.   No Known Allergies  ROS: See HPI for pertinent positives and negatives.  Physical Examination  Filed Vitals:   11/23/14 0908  BP: 153/81  Pulse: 49  Resp: 14  Height: '5\' 10"'$  (1.778 m)  Weight: 229 lb (103.874 kg)  SpO2: 97%   Body mass index is 32.86 kg/(m^2).   General: A&O x 3, WD, obese male in NAD.  Pulmonary: Sym exp, decreased air movt, CTAB, no rales, rhonchi, or wheezing, chronic dry cough.  Cardiac: RRR, Nl S1, S2, no detected murmur.   Carotid Bruits Left Right   Negative Negative  Aorta is not palpable Radial pulses are 2+ palpable and =.   VASCULAR EXAM:     LE Pulses LEFT RIGHT   POPLITEAL not palpable  not palpable   POSTERIOR TIBIAL 1+ palpable  2+ palpable    DORSALIS PEDIS  ANTERIOR TIBIAL 2+ palpable  2+ palpable     Gastrointestinal: soft, NTND, -G/R, - HSM, - palpable masses, - CVAT B, large panus.  Musculoskeletal: M/S 5/5 throughout, Extremities without ischemic changes  Neurologic: CN 2-12 intact except some hearing loss, Pain and light touch intact in extremities are intact except, Motor exam as listed above.          Non-Invasive Vascular Imaging  AAA Duplex (11/23/2014) ABDOMINAL AORTA DUPLEX EVALUATION    INDICATION: Follow-up abdominal aortic aneurysm     PREVIOUS INTERVENTION(S):     DUPLEX EXAM:     LOCATION DIAMETER AP (cm) DIAMETER TRANSVERSE (cm) VELOCITIES (cm/sec)  Aorta Proximal 2.02 2.13 100  Aorta Mid 4.52 4.52 109  Aorta Distal 2.35 2.37 66  Right Common Iliac Artery 1.07  115  Left Common Iliac Artery 1.05  119    Previous max aortic diameter:  4.3cm x 4.1cm Date: 10/20/2013  ADDITIONAL FINDINGS: Technically difficult study due to body habitus and bowel gas.    IMPRESSION: 1. Abdominal aortic  aneurysm measuring 4.52cm on today's exam. Overlying bowel gas may obscure larger measurement. 2. Bilateral iliac arteries were not well visualized.    Compared to the previous exam:  Increase in abdominal aortic aneurysm diameter compared to previous exam.     Medical Decision Making  The patient is a 74 y.o. male who presents with asymptomatic AAA with 0.22 cm increase in size in a year, to 4.52 cm today. Overlying bowel gas may obscure larger measurement.  Unfortunately he continues to smoke 2-3 ppd which is a major risk factor for aneurysmal growth. The patient was counseled re smoking cessation and given several free resources re smoking cessation.  Based on this patient's exam and diagnostic studies, the patient will follow up in 6 months  with the following studies: AAA Duplex.  Consideration for repair of AAA would be made when the size is 5.5 cm, growth > 1 cm/yr, and symptomatic status.  I emphasized the importance of maximal medical management including strict control of blood pressure, blood glucose, and lipid levels, antiplatelet agents, obtaining regular exercise, and  cessation of smoking.   The patient was given information about AAA including signs, symptoms, treatment, and how to minimize the risk of enlargement and rupture of aneurysms.    The patient was advised to call 911 should the patient experience sudden onset abdominal or back pain.   Thank you for allowing [place] to participate in this patient's care.  Clemon Chambers, RN, MSN, FNP-C [facility] [place] Office: [facility] Physician: Trula Slade  11/23/2014, 9:15 AM

## 2014-11-23 NOTE — Patient Instructions (Signed)
Abdominal Aortic Aneurysm An aneurysm is a weakened or damaged part of an artery wall that bulges from the normal force of blood pumping through the body. An abdominal aortic aneurysm is an aneurysm that occurs in the lower part of the aorta, the main artery of the body.  The major concern with an abdominal aortic aneurysm is that it can enlarge and burst (rupture) or blood can flow between the layers of the wall of the aorta through a tear (aorticdissection). Both of these conditions can cause bleeding inside the body and can be life threatening unless diagnosed and treated promptly. CAUSES  The exact cause of an abdominal aortic aneurysm is unknown. Some contributing factors are:   A hardening of the arteries caused by the buildup of fat and other substances in the lining of a blood vessel (arteriosclerosis).  Inflammation of the walls of an artery (arteritis).   Connective tissue diseases, such as Marfan syndrome.   Abdominal trauma.   An infection, such as syphilis or staphylococcus, in the wall of the aorta (infectious aortitis) caused by bacteria. RISK FACTORS  Risk factors that contribute to an abdominal aortic aneurysm may include:  Age older than 60 years.   High blood pressure (hypertension).  Male gender.  Ethnicity (white race).  Obesity.  Family history of aneurysm (first degree relatives only).  Tobacco use. PREVENTION  The following healthy lifestyle habits may help decrease your risk of abdominal aortic aneurysm:  Quitting smoking. Smoking can raise your blood pressure and cause arteriosclerosis.  Limiting or avoiding alcohol.  Keeping your blood pressure, blood sugar level, and cholesterol levels within normal limits.  Decreasing your salt intake. In somepeople, too much salt can raise blood pressure and increase your risk of abdominal aortic aneurysm.  Eating a diet low in saturated fats and cholesterol.  Increasing your fiber intake by including  whole grains, vegetables, and fruits in your diet. Eating these foods may help lower blood pressure.  Maintaining a healthy weight.  Staying physically active and exercising regularly. SYMPTOMS  The symptoms of abdominal aortic aneurysm may vary depending on the size and rate of growth of the aneurysm.Most grow slowly and do not have any symptoms. When symptoms do occur, they may include:  Pain (abdomen, side, lower back, or groin). The pain may vary in intensity. A sudden onset of severe pain may indicate that the aneurysm has ruptured.  Feeling full after eating only small amounts of food.  Nausea or vomiting or both.  Feeling a pulsating lump in the abdomen.  Feeling faint or passing out. DIAGNOSIS  Since most unruptured abdominal aortic aneurysms have no symptoms, they are often discovered during diagnostic exams for other conditions. An aneurysm may be found during the following procedures:  Ultrasonography (A one-time screening for abdominal aortic aneurysm by ultrasonography is also recommended for all men aged 65-75 years who have ever smoked).  X-ray exams.  A computed tomography (CT).  Magnetic resonance imaging (MRI).  Angiography or arteriography. TREATMENT  Treatment of an abdominal aortic aneurysm depends on the size of your aneurysm, your age, and risk factors for rupture. Medication to control blood pressure and pain may be used to manage aneurysms smaller than 6 cm. Regular monitoring for enlargement may be recommended by your caregiver if:  The aneurysm is 3-4 cm in size (an annual ultrasonography may be recommended).  The aneurysm is 4-4.5 cm in size (an ultrasonography every 6 months may be recommended).  The aneurysm is larger than 4.5 cm in   size (your caregiver may ask that you be examined by a vascular surgeon). If your aneurysm is larger than 6 cm, surgical repair may be recommended. There are two main methods for repair of an aneurysm:   Endovascular  repair (a minimally invasive surgery). This is done most often.  Open repair. This method is used if an endovascular repair is not possible. Document Released: 03/19/2005 Document Revised: 10/04/2012 Document Reviewed: 07/09/2012 ExitCare Patient Information 2015 [employer]. This information is not intended to replace advice given to you by your health care provider. Make sure you discuss any questions you have with your health care provider.   Smoking Cessation Quitting smoking is important to your health and has many advantages. However, it is not always easy to quit since nicotine is a very addictive drug. Oftentimes, people try 3 times or more before being able to quit. This document explains the best ways for you to prepare to quit smoking. Quitting takes hard work and a lot of effort, but you can do it. ADVANTAGES OF QUITTING SMOKING  You will live longer, feel better, and live better.  Your body will feel the impact of quitting smoking almost immediately.  Within 20 minutes, blood pressure decreases. Your pulse returns to its normal level.  After 8 hours, carbon monoxide levels in the blood return to normal. Your oxygen level increases.  After 24 hours, the chance of having a heart attack starts to decrease. Your breath, hair, and body stop smelling like smoke.  After 48 hours, damaged nerve endings begin to recover. Your sense of taste and smell improve.  After 72 hours, the body is virtually free of nicotine. Your bronchial tubes relax and breathing becomes easier.  After 2 to 12 weeks, lungs can hold more air. Exercise becomes easier and circulation improves.  The risk of having a heart attack, stroke, cancer, or lung disease is greatly reduced.  After 1 year, the risk of coronary heart disease is cut in half.  After 5 years, the risk of stroke falls to the same as a nonsmoker.  After 10 years, the risk of lung cancer is cut in half and the risk of other cancers  decreases significantly.  After 15 years, the risk of coronary heart disease drops, usually to the level of a nonsmoker.  If you are pregnant, quitting smoking will improve your chances of having a healthy baby.  The people you live with, especially any children, will be healthier.  You will have extra money to spend on things other than cigarettes. QUESTIONS TO THINK ABOUT BEFORE ATTEMPTING TO QUIT You may want to talk about your answers with your health care provider.  Why do you want to quit?  If you tried to quit in the past, what helped and what did not?  What will be the most difficult situations for you after you quit? How will you plan to handle them?  Who can help you through the tough times? Your family? Friends? A health care provider?  What pleasures do you get from smoking? What ways can you still get pleasure if you quit? Here are some questions to ask your health care provider:  How can you help me to be successful at quitting?  What medicine do you think would be best for me and how should I take it?  What should I do if I need more help?  What is smoking withdrawal like? How can I get information on withdrawal? GET READY  Set a quit   date.  Change your environment by getting rid of all cigarettes, ashtrays, matches, and lighters in your home, car, or work. Do not let people smoke in your home.  Review your past attempts to quit. Think about what worked and what did not. GET SUPPORT AND ENCOURAGEMENT You have a better chance of being successful if you have help. You can get support in many ways.  Tell your family, friends, and coworkers that you are going to quit and need their support. Ask them not to smoke around you.  Get individual, group, or telephone counseling and support. Programs are available at local hospitals and health centers. Call your local health department for information about programs in your area.  Spiritual beliefs and practices may  help some smokers quit.  Download a "quit meter" on your computer to keep track of quit statistics, such as how long you have gone without smoking, cigarettes not smoked, and money saved.  Get a self-help book about quitting smoking and staying off tobacco. LEARN NEW SKILLS AND BEHAVIORS  Distract yourself from urges to smoke. Talk to someone, go for a walk, or occupy your time with a task.  Change your normal routine. Take a different route to work. Drink tea instead of coffee. Eat breakfast in a different place.  Reduce your stress. Take a hot bath, exercise, or read a book.  Plan something enjoyable to do every day. Reward yourself for not smoking.  Explore interactive web-based programs that specialize in helping you quit. GET MEDICINE AND USE IT CORRECTLY Medicines can help you stop smoking and decrease the urge to smoke. Combining medicine with the above behavioral methods and support can greatly increase your chances of successfully quitting smoking.  Nicotine replacement therapy helps deliver nicotine to your body without the negative effects and risks of smoking. Nicotine replacement therapy includes nicotine gum, lozenges, inhalers, nasal sprays, and skin patches. Some may be available over-the-counter and others require a prescription.  Antidepressant medicine helps people abstain from smoking, but how this works is unknown. This medicine is available by prescription.  Nicotinic receptor partial agonist medicine simulates the effect of nicotine in your brain. This medicine is available by prescription. Ask your health care provider for advice about which medicines to use and how to use them based on your health history. Your health care provider will tell you what side effects to look out for if you choose to be on a medicine or therapy. Carefully read the information on the package. Do not use any other product containing nicotine while using a nicotine replacement product.    RELAPSE OR DIFFICULT SITUATIONS Most relapses occur within the first 3 months after quitting. Do not be discouraged if you start smoking again. Remember, most people try several times before finally quitting. You may have symptoms of withdrawal because your body is used to nicotine. You may crave cigarettes, be irritable, feel very hungry, cough often, get headaches, or have difficulty concentrating. The withdrawal symptoms are only temporary. They are strongest when you first quit, but they will go away within 10-14 days. To reduce the chances of relapse, try to:  Avoid drinking alcohol. Drinking lowers your chances of successfully quitting.  Reduce the amount of caffeine you consume. Once you quit smoking, the amount of caffeine in your body increases and can give you symptoms, such as a rapid heartbeat, sweating, and anxiety.  Avoid smokers because they can make you want to smoke.  Do not let weight gain distract you. Many   smokers will gain weight when they quit, usually less than 10 pounds. Eat a healthy diet and stay active. You can always lose the weight gained after you quit.  Find ways to improve your mood other than smoking. FOR MORE INFORMATION  www.smokefree.gov  Document Released: 06/03/2001 Document Revised: 10/24/2013 Document Reviewed: 09/18/2011 ExitCare Patient Information 2015 [employer]. This information is not intended to replace advice given to you by your health care provider. Make sure you discuss any questions you have with your health care provider.   Smoking Cessation, Tips for Success If you are ready to quit smoking, congratulations! You have chosen to help yourself be healthier. Cigarettes bring nicotine, tar, carbon monoxide, and other irritants into your body. Your lungs, heart, and blood vessels will be able to work better without these poisons. There are many different ways to quit smoking. Nicotine gum, nicotine patches, a nicotine inhaler, or nicotine  nasal spray can help with physical craving. Hypnosis, support groups, and medicines help break the habit of smoking. WHAT THINGS CAN I DO TO MAKE QUITTING EASIER?  Here are some tips to help you quit for good:  Pick a date when you will quit smoking completely. Tell all of your friends and family about your plan to quit on that date.  Do not try to slowly cut down on the number of cigarettes you are smoking. Pick a quit date and quit smoking completely starting on that day.  Throw away all cigarettes.   Clean and remove all ashtrays from your home, work, and car.  On a card, write down your reasons for quitting. Carry the card with you and read it when you get the urge to smoke.  Cleanse your body of nicotine. Drink enough water and fluids to keep your urine clear or pale yellow. Do this after quitting to flush the nicotine from your body.  Learn to predict your moods. Do not let a bad situation be your excuse to have a cigarette. Some situations in your life might tempt you into wanting a cigarette.  Never have "just one" cigarette. It leads to wanting another and another. Remind yourself of your decision to quit.  Change habits associated with smoking. If you smoked while driving or when feeling stressed, try other activities to replace smoking. Stand up when drinking your coffee. Brush your teeth after eating. Sit in a different chair when you read the paper. Avoid alcohol while trying to quit, and try to drink fewer caffeinated beverages. Alcohol and caffeine may urge you to smoke.  Avoid foods and drinks that can trigger a desire to smoke, such as sugary or spicy foods and alcohol.  Ask people who smoke not to smoke around you.  Have something planned to do right after eating or having a cup of coffee. For example, plan to take a walk or exercise.  Try a relaxation exercise to calm you down and decrease your stress. Remember, you may be tense and nervous for the first 2 weeks after  you quit, but this will pass.  Find new activities to keep your hands busy. Play with a pen, coin, or rubber band. Doodle or draw things on paper.  Brush your teeth right after eating. This will help cut down on the craving for the taste of tobacco after meals. You can also try mouthwash.   Use oral substitutes in place of cigarettes. Try using lemon drops, carrots, cinnamon sticks, or chewing gum. Keep them handy so they are available when you   have the urge to smoke.  When you have the urge to smoke, try deep breathing.  Designate your home as a nonsmoking area.  If you are a heavy smoker, ask your health care provider about a prescription for nicotine chewing gum. It can ease your withdrawal from nicotine.  Reward yourself. Set aside the cigarette money you save and buy yourself something nice.  Look for support from others. Join a support group or smoking cessation program. Ask someone at home or at work to help you with your plan to quit smoking.  Always ask yourself, "Do I need this cigarette or is this just a reflex?" Tell yourself, "Today, I choose not to smoke," or "I do not want to smoke." You are reminding yourself of your decision to quit.  Do not replace cigarette smoking with electronic cigarettes (commonly called e-cigarettes). The safety of e-cigarettes is unknown, and some may contain harmful chemicals.  If you relapse, do not give up! Plan ahead and think about what you will do the next time you get the urge to smoke. HOW WILL I FEEL WHEN I QUIT SMOKING? You may have symptoms of withdrawal because your body is used to nicotine (the addictive substance in cigarettes). You may crave cigarettes, be irritable, feel very hungry, cough often, get headaches, or have difficulty concentrating. The withdrawal symptoms are only temporary. They are strongest when you first quit but will go away within 10-14 days. When withdrawal symptoms occur, stay in control. Think about your reasons  for quitting. Remind yourself that these are signs that your body is healing and getting used to being without cigarettes. Remember that withdrawal symptoms are easier to treat than the major diseases that smoking can cause.  Even after the withdrawal is over, expect periodic urges to smoke. However, these cravings are generally short lived and will go away whether you smoke or not. Do not smoke! WHAT RESOURCES ARE AVAILABLE TO HELP ME QUIT SMOKING? Your health care provider can direct you to community resources or hospitals for support, which may include:  Group support.  Education.  Hypnosis.  Therapy. Document Released: 03/07/2004 Document Revised: 10/24/2013 Document Reviewed: 11/25/2012 ExitCare Patient Information 2015 [employer]. This information is not intended to replace advice given to you by your health care provider. Make sure you discuss any questions you have with your health care provider.   

## 2014-11-23 NOTE — Addendum Note (Signed)
Addended by: Dorthula Rue L on: 11/23/2014 10:24 AM   Modules accepted: Orders

## 2015-02-06 DIAGNOSIS — Z8669 Personal history of other diseases of the nervous system and sense organs: Secondary | ICD-10-CM | POA: Diagnosis not present

## 2015-02-06 DIAGNOSIS — H6120 Impacted cerumen, unspecified ear: Secondary | ICD-10-CM | POA: Diagnosis not present

## 2015-02-06 DIAGNOSIS — H6123 Impacted cerumen, bilateral: Secondary | ICD-10-CM | POA: Diagnosis not present

## 2015-02-28 ENCOUNTER — Other Ambulatory Visit: Payer: Self-pay | Admitting: Cardiovascular Disease

## 2015-03-05 DIAGNOSIS — I10 Essential (primary) hypertension: Secondary | ICD-10-CM | POA: Diagnosis not present

## 2015-03-05 DIAGNOSIS — E119 Type 2 diabetes mellitus without complications: Secondary | ICD-10-CM | POA: Diagnosis not present

## 2015-03-05 DIAGNOSIS — J449 Chronic obstructive pulmonary disease, unspecified: Secondary | ICD-10-CM | POA: Diagnosis not present

## 2015-03-05 DIAGNOSIS — E785 Hyperlipidemia, unspecified: Secondary | ICD-10-CM | POA: Diagnosis not present

## 2015-03-08 DIAGNOSIS — F17200 Nicotine dependence, unspecified, uncomplicated: Secondary | ICD-10-CM | POA: Diagnosis not present

## 2015-03-08 DIAGNOSIS — E119 Type 2 diabetes mellitus without complications: Secondary | ICD-10-CM | POA: Diagnosis not present

## 2015-03-08 DIAGNOSIS — J449 Chronic obstructive pulmonary disease, unspecified: Secondary | ICD-10-CM | POA: Diagnosis not present

## 2015-03-08 DIAGNOSIS — I10 Essential (primary) hypertension: Secondary | ICD-10-CM | POA: Diagnosis not present

## 2015-03-13 DIAGNOSIS — Z720 Tobacco use: Secondary | ICD-10-CM | POA: Diagnosis not present

## 2015-03-13 DIAGNOSIS — J449 Chronic obstructive pulmonary disease, unspecified: Secondary | ICD-10-CM | POA: Diagnosis not present

## 2015-03-13 DIAGNOSIS — J310 Chronic rhinitis: Secondary | ICD-10-CM | POA: Diagnosis not present

## 2015-03-15 ENCOUNTER — Encounter: Payer: Self-pay | Admitting: Cardiovascular Disease

## 2015-03-15 ENCOUNTER — Ambulatory Visit (INDEPENDENT_AMBULATORY_CARE_PROVIDER_SITE_OTHER): Payer: Commercial Managed Care - HMO | Admitting: Cardiovascular Disease

## 2015-03-15 VITALS — BP 140/88 | HR 64 | Ht 70.0 in | Wt 229.8 lb

## 2015-03-15 DIAGNOSIS — I714 Abdominal aortic aneurysm, without rupture, unspecified: Secondary | ICD-10-CM

## 2015-03-15 DIAGNOSIS — I10 Essential (primary) hypertension: Secondary | ICD-10-CM | POA: Diagnosis not present

## 2015-03-15 NOTE — Patient Instructions (Signed)
Medication Instructions:   Your physician recommends that you continue on your current medications as directed. Please refer to the Current Medication list given to you today.    Labwork: NONE ORDER TODAY    Testing/Procedures: NONE ORDER TODAY    Follow-Up:  Your physician wants you to follow-up in: [place] will receive a reminder letter in the mail two months in advance. If you don't receive a letter, please call our office to schedule the follow-up appointment.     Any Other Special Instructions Will Be Listed Below (If Applicable).

## 2015-03-15 NOTE — Progress Notes (Signed)
Cardiology Office Note Date:  03/15/2015   ID:  Kevin Frank, DOB Jul 21, 1940, MRN [id number]  PCP:  Alonza Bogus, MD  Cardiologist:  Sherren Mocha, MD    Chief Complaint  Patient presents with  . Hypertension    History of Present Illness: Kevin Frank is a 74 y.o. male who presents for  Follow-up of hypertension, hyperlipidemia, tobacco abuse, and abdominal aortic aneurysm. The patient's aneurysm surveillance is performed by vascular surgery. At his recent assessment his AAA measured 4.5 cm. He is scheduled for another study in 6 months. He continues to smoke cigarettes.   the patient is doing okay. He remains moderately active. 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 He reports no symptoms of chest pain or pressure, edema, heart palpitations, or lightheadedness. He tolerates his medications well. Follows regularly with Dr. Luan Pulling and had recent lab follow-up. Symptoms include shortness of breath and cough. He is a heavy smoker , reporting at least 2 packs per day without interested in quitting.   Past Medical History  Diagnosis Date  . Hyperlipidemia   . Hypertension   . AAA (abdominal aortic aneurysm) 01/2008    3.2cm  . Obesity   . Hypercholesterolemia   . Shortness of breath     due to cigarette abuse  . Obstructive sleep apnea on CPAP   . Tobacco abuse   . Normal nuclear stress test 2011  . Diabetes mellitus     type II  . BPH (benign prostatic hyperplasia)   . Gout   . Presence of indwelling urinary catheter   . COPD (chronic obstructive pulmonary disease)     Past Surgical History  Procedure Laterality Date  . Cholecystectomy  1997    [facility]  . Transurethral resection of prostate  01/07/2011    Procedure: TRANSURETHRAL RESECTION OF THE PROSTATE (TURP);  Surgeon: Marissa Nestle;  Location: [facility];  Service: Urology;  Laterality: N/A;  . Cholecystectomy  1989    Current Outpatient Prescriptions  Medication Sig Dispense Refill  . albuterol  (PROVENTIL HFA;VENTOLIN HFA) 108 (90 BASE) MCG/ACT inhaler Inhale 2 puffs into the lungs every 6 (six) hours as needed.    [name] allopurinol (ZYLOPRIM) 300 MG tablet Take 300 mg by mouth daily.      [name] amLODipine (NORVASC) 10 MG tablet TAKE 1 TABLET ONCE DAILY. 30 tablet 0  . atorvastatin (LIPITOR) 20 MG tablet     . furosemide (LASIX) 40 MG tablet Take 40 mg by mouth daily.      [name] losartan (COZAAR) 50 MG tablet TAKE 1 TABLET DAILY. 180 tablet 1  . metFORMIN (GLUCOPHAGE) 500 MG tablet Take 500 mg by mouth 2 (two) times daily with a meal.      . sertraline (ZOLOFT) 50 MG tablet Take 50 mg by mouth daily.      [name] SPIRIVA HANDIHALER 18 MCG inhalation capsule Place 18 mcg into inhaler and inhale as needed. For congestion/breathing    . SYMBICORT 160-4.5 MCG/ACT inhaler 2 puffs 2 (two) times daily.     No current facility-administered medications for this visit.    Allergies:   Review of patient's allergies indicates no known allergies.   Social History:  The patient  reports that he has been smoking Cigarettes.  He has a 100 pack-year smoking history. He has never used smokeless tobacco. He reports that he drinks alcohol. He reports that he does not use illicit drugs.   Family History:  The patient's  family history includes Cancer in  his mother; Diabetes in his mother; Hypertension in his mother; Liver disease in his father; Lymphoma in his mother; Mental retardation in an other family member.   ROS:  Please see the history of present illness.  Otherwise, review of systems is positive for  Snoring , cough , hearing loss.  All other systems are reviewed and negative.   PHYSICAL EXAM: VS:  BP 140/88 mmHg  Pulse 64  Ht '5\' 10"'$  (1.778 m)  Wt 229 lb 12.8 oz (104.237 kg)  BMI 32.97 kg/m2 , BMI Body mass index is 32.97 kg/(m^2). GEN: Well nourished, well developed, pleasant obese male in no acute distress HEENT: normal Neck: no JVD, no masses. No carotid bruits Cardiac: RRR without murmur or gallop                 Respiratory:  clear to auscultation bilaterally, normal work of breathing GI: soft, nontender, nondistended, + BS, obese MS: no deformity or atrophy Ext: no pretibial edema, pedal pulses 2+= bilaterally Skin: warm and dry, no rash Neuro:  Strength and sensation are intact Psych: euthymic mood, full affect  EKG:  EKG is ordered today. The ekg ordered today shows  Normal sinus rhythm 64 bpm, occasional PVCs, occasional PAC  Recent Labs: No results found for requested labs within last 365 days.   Lipid Panel     Component Value Date/Time   CHOL 138 12/16/2010 0854   TRIG 53.0 12/16/2010 0854   HDL 46.10 12/16/2010 0854   CHOLHDL 3 12/16/2010 0854   VLDL 10.6 12/16/2010 0854   LDLCALC 81 12/16/2010 0854      Wt Readings from Last 3 Encounters:  03/15/15 229 lb 12.8 oz (104.237 kg)  11/23/14 229 lb (103.874 kg)  09/12/14 230 lb (104.327 kg)    ASSESSMENT AND PLAN: 1.   Abdominal aortic aneurysm: the patient is followed by vascular surgery with ultrasound surveillance. He has had continued slow growth of his AAA. I reinforced the importance of tobacco cessation and the impact this makes on his aneurysm it. He understands but is not inclined to quit. We also discussed the importance of tight blood pressure control. He is scheduled for six-month follow-up at the [facility] office.   2. Essential hypertension: Blood pressure is controlled on his current medical program which was reviewed today.  3. Hyperlipidemia: The patient is treated with a statin drug. He is followed by his primary physician.   4. Tobacco abuse, heavy: He continues to smoke and is not inclined to quit. He was counseled today.   Current medicines are reviewed with the patient today.  The patient does not have concerns regarding medicines.  Labs/ tests ordered today include:   Orders Placed This Encounter  Procedures  . EKG 12-Lead    Disposition:   FU  One year with Truitt Merle.  Deatra James, MD  03/15/2015 9:19 AM    [address], [place], [place]  [address] Phone: [phone]; Fax: [phone]

## 2015-03-27 ENCOUNTER — Telehealth: Payer: Self-pay

## 2015-03-27 NOTE — Telephone Encounter (Signed)
PATIENT WALKED IN BECAUSE HE HAS BEEN EXPERIENCING SINUS SYMPTOMS SINCE YESTERDAY.  RUNNY NOSE, DRY COUGH.  NO FEVER.  HE STATED HE FEELS BETTER TODAY.

## 2015-03-28 ENCOUNTER — Telehealth: Payer: Self-pay | Admitting: Allergy and Immunology

## 2015-03-28 NOTE — Telephone Encounter (Signed)
Wife informed of Dr. Ishmael Holter' recommendation.  He does have Patanase.  She will use that and the nasal saline.  She states Mr. Secrist is still feeling bad.  She states she is going to call Dr. Luan Pulling and get him an appt.   She is worried he is going to get worse withoiut an antibiotic

## 2015-03-28 NOTE — Telephone Encounter (Signed)
I spoke to [place] (daughter).  Mr. Hern needs refill on Spiriva.  I advised Sharyn Lull to contact Dr. Luan Pulling to refill the Spiriva.  Dr. Luan Pulling originated the prescription.  I advised Sharyn Lull to get Mr. Pilger worked in with Dr. Luan Pulling or take him to Urgent Care or the hospital.  I also advised that Mr. Broner needs to take medications as prescribed.  Sharyn Lull states she will try to get him in with Dr. Luan Pulling.  She believes he is running a fever also.  She will call us back if needed.

## 2015-03-28 NOTE — Telephone Encounter (Signed)
  Patient may use Saline nasal wash 2-4 times daily, followed by his Patanase 2 sprays each nostril twice daily--which I have previously prescribed. ProAir as needed.  If persisting symptoms appointment with our office or Dr. Luan Pulling.

## 2015-03-28 NOTE — Telephone Encounter (Signed)
Kevin Frank wife called this morning following up on TC from yesterday.  She states Kevin Frank still has a "dry cough", and nasal symptoms.  Is there something we can call in?

## 2015-03-28 NOTE — Telephone Encounter (Signed)
Pt's daughter is very worried about father.  He is laying down with CPAP (no med in it) .  Hasn't been taking meds that Dr. Ishmael Holter prescribed for 2 weeks.  Not taking copd med she prescribed. Daughter very worried. pls call her back

## 2015-03-30 ENCOUNTER — Other Ambulatory Visit: Payer: Self-pay | Admitting: Cardiovascular Disease

## 2015-05-21 ENCOUNTER — Other Ambulatory Visit: Payer: Self-pay | Admitting: Allergy and Immunology

## 2015-06-05 ENCOUNTER — Ambulatory Visit (INDEPENDENT_AMBULATORY_CARE_PROVIDER_SITE_OTHER): Payer: Commercial Managed Care - HMO | Admitting: Allergy and Immunology

## 2015-06-05 ENCOUNTER — Encounter: Payer: Self-pay | Admitting: Allergy and Immunology

## 2015-06-05 VITALS — BP 128/70 | HR 68 | Temp 97.8°F | Resp 16

## 2015-06-05 DIAGNOSIS — J3089 Other allergic rhinitis: Secondary | ICD-10-CM

## 2015-06-05 DIAGNOSIS — R059 Cough, unspecified: Secondary | ICD-10-CM

## 2015-06-05 DIAGNOSIS — J309 Allergic rhinitis, unspecified: Secondary | ICD-10-CM | POA: Diagnosis not present

## 2015-06-05 DIAGNOSIS — R05 Cough: Secondary | ICD-10-CM | POA: Diagnosis not present

## 2015-06-05 MED ORDER — OLOPATADINE HCL 0.6 % NA SOLN: NASAL | Status: DC

## 2015-06-05 NOTE — Patient Instructions (Addendum)
  Prednisone '30mg'$  now.  Restart Patanase 1-2 sprays each nostril each morning and evening.  Continue Symbicort and Spriva.  Saline nasal wash each prior to Patanase and each evening in the shower.  Mucinex once daily as needed.  Decrease cig use each week--choose quit date.  Follow-up in  One month or sooner if needed.

## 2015-06-05 NOTE — Progress Notes (Signed)
FOLLOW UP NOTE  RE: Kevin Frank MRN: [id number] DOB: 1940/11/06 [facility] [place] [address] [place] [address] Date of Office Visit: 06/05/2015  Subjective:  Kevin Frank is a 74 y.o. male who presents today for Cough  Assessment:  1.  Allergic rhinoconjunctivitis. 2.  History of COPD as followed by Dr. Luan Pulling. 3.  Report of intermittent cough as associated with above. 4.  Chronic tobacco use. 5.  Complex medical history. 6.  Unclear consistent medication adherence.  Plan:   Meds ordered this encounter  Medications  . Olopatadine HCl 0.6 % SOLN    Sig: USE 1-2 SPRAYS IN EACH NOSTRIL EACH MORNING AND EVENING.    Dispense:  1 Bottle    Refill:  5   1.  Kevin Frank received Prednisone '30mg'$  now. 2.  Restart Patanase 1-2 sprays each nostril each morning and evening. 3.  Continue Symbicort and Spriva. 4.  Saline nasal wash each nostril  prior to Patanase and each evening in the shower. 5.  Mucinex once daily as needed. 6.  Decrease cig use each week--choose quit date. 7.  Follow-up in one month or sooner if needed.  HPI: Kevin Frank returns to the office with report of intermittent cough.  Since his visit last in September,  he saw Dr. Luan Pulling in October with increasing respiratory symptoms and sinus complaints.  He completed doxycycline and a Medrol Dosepak with improvement.  He notices postnasal drip, especially in the morning, which triggers a cough and is occasionally associated with chest congestion where he is is attempting to clear the throat mucus and may note rare wheeze but denies shortness of breath, difficulty breathing, chest tightness, disrupted sleep or activity.  Not using any nose sprays.  He had not reviewed with Dr.Hawkins any persisting symptoms from the October visit.  Denies any recent albuterol use.  Denies ED or urgent care visits.  He continues to be active as typically and is smoking about 2 packs of cigarettes a day.  Current  Medications: 1.  Symbicort 160 g 2 puffs twice daily. 2.  Spiriva one puff once daily. 3.  Ventolin 2 puffs every 4 as needed for cough or wheeze. 4.  Continues allopurinol, Norvasc, Lipitor, Lasix, Cozaar, Glucophage, Toprol, and Zoloft. Drug Allergies: No Known Allergies  Objective:   Filed Vitals:   06/05/15 1059  BP: 128/70  Pulse: 68  Temp: 97.8 F (36.6 C)  Resp: 16   SpO2 Readings from Last 1 Encounters:  06/05/15 98%  ] Physical Exam  Constitutional: He is well-developed, well-nourished, and in no distress.  Interactive communicating easily in full sentences without cough.  HENT:  Head: Atraumatic.  Right Ear: Tympanic membrane and ear canal normal.  Left Ear: Tympanic membrane and ear canal normal.  Nose: Mucosal edema present. No rhinorrhea. No epistaxis.  Mouth/Throat: Oropharynx is clear and moist and mucous membranes are normal. No oropharyngeal exudate, posterior oropharyngeal edema or posterior oropharyngeal erythema.  Eyes: Conjunctivae are normal.  Neck: Neck supple.  Cardiovascular: Normal rate, S1 normal and S2 normal.   No murmur heard. Pulmonary/Chest: Effort normal and breath sounds normal. He has no wheezes. He has no rhonchi. He has no rales.  Lymphadenopathy:    He has no cervical adenopathy.  Skin: Skin is warm and intact. No rash noted. No cyanosis. Nails show no clubbing.    Diagnostics: Spirometry:  FVC  3.01--73%, FEV1 2.30--85%.     Kevin Frank M. Ishmael Holter, MD  cc: Alonza Bogus, MD

## 2015-06-08 ENCOUNTER — Encounter: Payer: Self-pay | Admitting: Family

## 2015-06-14 ENCOUNTER — Encounter: Payer: Self-pay | Admitting: Family

## 2015-06-14 ENCOUNTER — Ambulatory Visit (INDEPENDENT_AMBULATORY_CARE_PROVIDER_SITE_OTHER): Payer: Commercial Managed Care - HMO | Admitting: Family

## 2015-06-14 ENCOUNTER — Ambulatory Visit (HOSPITAL_COMMUNITY)
Admission: RE | Admit: 2015-06-14 | Discharge: 2015-06-14 | Disposition: A | Discharge status: Home / Self Care | Payer: Commercial Managed Care - HMO | Source: Ambulatory Visit | Attending: Family | Admitting: Family

## 2015-06-14 VITALS — BP 142/77 | HR 53 | Temp 98.3°F | Resp 16 | Ht 65.5 in | Wt 228.6 lb

## 2015-06-14 DIAGNOSIS — I10 Essential (primary) hypertension: Secondary | ICD-10-CM | POA: Insufficient documentation

## 2015-06-14 DIAGNOSIS — Z720 Tobacco use: Secondary | ICD-10-CM

## 2015-06-14 DIAGNOSIS — E669 Obesity, unspecified: Secondary | ICD-10-CM | POA: Insufficient documentation

## 2015-06-14 DIAGNOSIS — I714 Abdominal aortic aneurysm, without rupture, unspecified: Secondary | ICD-10-CM | POA: Insufficient documentation

## 2015-06-14 DIAGNOSIS — E785 Hyperlipidemia, unspecified: Secondary | ICD-10-CM | POA: Diagnosis not present

## 2015-06-14 DIAGNOSIS — E119 Type 2 diabetes mellitus without complications: Secondary | ICD-10-CM | POA: Diagnosis not present

## 2015-06-14 DIAGNOSIS — E7800 Pure hypercholesterolemia, unspecified: Secondary | ICD-10-CM | POA: Diagnosis not present

## 2015-06-14 DIAGNOSIS — F17200 Nicotine dependence, unspecified, uncomplicated: Secondary | ICD-10-CM | POA: Insufficient documentation

## 2015-06-14 DIAGNOSIS — Z6837 Body mass index (BMI) 37.0-37.9, adult: Secondary | ICD-10-CM | POA: Diagnosis not present

## 2015-06-14 NOTE — Patient Instructions (Signed)
Abdominal Aortic Aneurysm An aneurysm is a weakened or damaged part of an artery wall that bulges from the normal force of blood pumping through the body. An abdominal aortic aneurysm is an aneurysm that occurs in the lower part of the aorta, the main artery of the body.  The major concern with an abdominal aortic aneurysm is that it can enlarge and burst (rupture) or blood can flow between the layers of the wall of the aorta through a tear (aorticdissection). Both of these conditions can cause bleeding inside the body and can be life threatening unless diagnosed and treated promptly. CAUSES  The exact cause of an abdominal aortic aneurysm is unknown. Some contributing factors are:   A hardening of the arteries caused by the buildup of fat and other substances in the lining of a blood vessel (arteriosclerosis).  Inflammation of the walls of an artery (arteritis).   Connective tissue diseases, such as Marfan syndrome.   Abdominal trauma.   An infection, such as syphilis or staphylococcus, in the wall of the aorta (infectious aortitis) caused by bacteria. RISK FACTORS  Risk factors that contribute to an abdominal aortic aneurysm may include:  Age older than 60 years.   High blood pressure (hypertension).  Male gender.  Ethnicity (white race).  Obesity.  Family history of aneurysm (first degree relatives only).  Tobacco use. PREVENTION  The following healthy lifestyle habits may help decrease your risk of abdominal aortic aneurysm:  Quitting smoking. Smoking can raise your blood pressure and cause arteriosclerosis.  Limiting or avoiding alcohol.  Keeping your blood pressure, blood sugar level, and cholesterol levels within normal limits.  Decreasing your salt intake. In somepeople, too much salt can raise blood pressure and increase your risk of abdominal aortic aneurysm.  Eating a diet low in saturated fats and cholesterol.  Increasing your fiber intake by including  whole grains, vegetables, and fruits in your diet. Eating these foods may help lower blood pressure.  Maintaining a healthy weight.  Staying physically active and exercising regularly. SYMPTOMS  The symptoms of abdominal aortic aneurysm may vary depending on the size and rate of growth of the aneurysm.Most grow slowly and do not have any symptoms. When symptoms do occur, they may include:  Pain (abdomen, side, lower back, or groin). The pain may vary in intensity. A sudden onset of severe pain may indicate that the aneurysm has ruptured.  Feeling full after eating only small amounts of food.  Nausea or vomiting or both.  Feeling a pulsating lump in the abdomen.  Feeling faint or passing out. DIAGNOSIS  Since most unruptured abdominal aortic aneurysms have no symptoms, they are often discovered during diagnostic exams for other conditions. An aneurysm may be found during the following procedures:  Ultrasonography (A one-time screening for abdominal aortic aneurysm by ultrasonography is also recommended for all men aged 65-75 years who have ever smoked).  X-ray exams.  A computed tomography (CT).  Magnetic resonance imaging (MRI).  Angiography or arteriography. TREATMENT  Treatment of an abdominal aortic aneurysm depends on the size of your aneurysm, your age, and risk factors for rupture. Medication to control blood pressure and pain may be used to manage aneurysms smaller than 6 cm. Regular monitoring for enlargement may be recommended by your caregiver if:  The aneurysm is 3-4 cm in size (an annual ultrasonography may be recommended).  The aneurysm is 4-4.5 cm in size (an ultrasonography every 6 months may be recommended).  The aneurysm is larger than 4.5 cm in   size (your caregiver may ask that you be examined by a vascular surgeon). If your aneurysm is larger than 6 cm, surgical repair may be recommended. There are two main methods for repair of an aneurysm:   Endovascular  repair (a minimally invasive surgery). This is done most often.  Open repair. This method is used if an endovascular repair is not possible.   This information is not intended to replace advice given to you by your health care provider. Make sure you discuss any questions you have with your health care provider.   Document Released: 03/19/2005 Document Revised: 10/04/2012 Document Reviewed: 07/09/2012 Elsevier Interactive Patient Education 2016 [employer].    Smoking Cessation, Tips for Success If you are ready to quit smoking, congratulations! You have chosen to help yourself be healthier. Cigarettes bring nicotine, tar, carbon monoxide, and other irritants into your body. Your lungs, heart, and blood vessels will be able to work better without these poisons. There are many different ways to quit smoking. Nicotine gum, nicotine patches, a nicotine inhaler, or nicotine nasal spray can help with physical craving. Hypnosis, support groups, and medicines help break the habit of smoking. WHAT THINGS CAN I DO TO MAKE QUITTING EASIER?  Here are some tips to help you quit for good:  Pick a date when you will quit smoking completely. Tell all of your friends and family about your plan to quit on that date.  Do not try to slowly cut down on the number of cigarettes you are smoking. Pick a quit date and quit smoking completely starting on that day.  Throw away all cigarettes.   Clean and remove all ashtrays from your home, work, and car.  On a card, write down your reasons for quitting. Carry the card with you and read it when you get the urge to smoke.  Cleanse your body of nicotine. Drink enough water and fluids to keep your urine clear or pale yellow. Do this after quitting to flush the nicotine from your body.  Learn to predict your moods. Do not let a bad situation be your excuse to have a cigarette. Some situations in your life might tempt you into wanting a cigarette.  Never have "just  one" cigarette. It leads to wanting another and another. Remind yourself of your decision to quit.  Change habits associated with smoking. If you smoked while driving or when feeling stressed, try other activities to replace smoking. Stand up when drinking your coffee. Brush your teeth after eating. Sit in a different chair when you read the paper. Avoid alcohol while trying to quit, and try to drink fewer caffeinated beverages. Alcohol and caffeine may urge you to smoke.  Avoid foods and drinks that can trigger a desire to smoke, such as sugary or spicy foods and alcohol.  Ask people who smoke not to smoke around you.  Have something planned to do right after eating or having a cup of coffee. For example, plan to take a walk or exercise.  Try a relaxation exercise to calm you down and decrease your stress. Remember, you may be tense and nervous for the first 2 weeks after you quit, but this will pass.  Find new activities to keep your hands busy. Play with a pen, coin, or rubber band. Doodle or draw things on paper.  Brush your teeth right after eating. This will help cut down on the craving for the taste of tobacco after meals. You can also try mouthwash.   Use oral  substitutes in place of cigarettes. Try using lemon drops, carrots, cinnamon sticks, or chewing gum. Keep them handy so they are available when you have the urge to smoke.  When you have the urge to smoke, try deep breathing.  Designate your home as a nonsmoking area.  If you are a heavy smoker, ask your health care provider about a prescription for nicotine chewing gum. It can ease your withdrawal from nicotine.  Reward yourself. Set aside the cigarette money you save and buy yourself something nice.  Look for support from others. Join a support group or smoking cessation program. Ask someone at home or at work to help you with your plan to quit smoking.  Always ask yourself, "Do I need this cigarette or is this just a  reflex?" Tell yourself, "Today, I choose not to smoke," or "I do not want to smoke." You are reminding yourself of your decision to quit.  Do not replace cigarette smoking with electronic cigarettes (commonly called e-cigarettes). The safety of e-cigarettes is unknown, and some may contain harmful chemicals.  If you relapse, do not give up! Plan ahead and think about what you will do the next time you get the urge to smoke. HOW WILL I FEEL WHEN I QUIT SMOKING? You may have symptoms of withdrawal because your body is used to nicotine (the addictive substance in cigarettes). You may crave cigarettes, be irritable, feel very hungry, cough often, get headaches, or have difficulty concentrating. The withdrawal symptoms are only temporary. They are strongest when you first quit but will go away within 10-14 days. When withdrawal symptoms occur, stay in control. Think about your reasons for quitting. Remind yourself that these are signs that your body is healing and getting used to being without cigarettes. Remember that withdrawal symptoms are easier to treat than the major diseases that smoking can cause.  Even after the withdrawal is over, expect periodic urges to smoke. However, these cravings are generally short lived and will go away whether you smoke or not. Do not smoke! WHAT RESOURCES ARE AVAILABLE TO HELP ME QUIT SMOKING? Your health care provider can direct you to community resources or hospitals for support, which may include:  Group support.  Education.  Hypnosis.  Therapy.   This information is not intended to replace advice given to you by your health care provider. Make sure you discuss any questions you have with your health care provider.   Document Released: 03/07/2004 Document Revised: 06/30/2014 Document Reviewed: 11/25/2012 Elsevier Interactive Patient Education 2016 [employer].     Steps to Quit Smoking  Smoking tobacco can be harmful to your health and can affect  almost every organ in your body. Smoking puts you, and those around you, at risk for developing many serious chronic diseases. Quitting smoking is difficult, but it is one of the best things that you can do for your health. It is never too late to quit. WHAT ARE THE BENEFITS OF QUITTING SMOKING? When you quit smoking, you lower your risk of developing serious diseases and conditions, such as:  Lung cancer or lung disease, such as COPD.  Heart disease.  Stroke.  Heart attack.  Infertility.  Osteoporosis and bone fractures. Additionally, symptoms such as coughing, wheezing, and shortness of breath may get better when you quit. You may also find that you get sick less often because your body is stronger at fighting off colds and infections. If you are pregnant, quitting smoking can help to reduce your chances of having  a baby of low birth weight. HOW DO I GET READY TO QUIT? When you decide to quit smoking, create a plan to make sure that you are successful. Before you quit:  Pick a date to quit. Set a date within the next two weeks to give you time to prepare.  Write down the reasons why you are quitting. Keep this list in places where you will see it often, such as on your bathroom mirror or in your car or wallet.  Identify the people, places, things, and activities that make you want to smoke (triggers) and avoid them. Make sure to take these actions:  Throw away all cigarettes at home, at work, and in your car.  Throw away smoking accessories, such as Scientist, research (medical).  Clean your car and make sure to empty the ashtray.  Clean your home, including curtains and carpets.  Tell your family, friends, and coworkers that you are quitting. Support from your loved ones can make quitting easier.  Talk with your health care provider about your options for quitting smoking.  Find out what treatment options are covered by your health insurance. WHAT STRATEGIES CAN I USE TO QUIT  SMOKING?  Talk with your healthcare provider about different strategies to quit smoking. Some strategies include:  Quitting smoking altogether instead of gradually lessening how much you smoke over a period of time. Research shows that quitting "cold [place]" is more successful than gradually quitting.  Attending in-person counseling to help you build problem-solving skills. You are more likely to have success in quitting if you attend several counseling sessions. Even short sessions of 10 minutes can be effective.  Finding resources and support systems that can help you to quit smoking and remain smoke-free after you quit. These resources are most helpful when you use them often. They can include:  Online chats with a Social worker.  Telephone quitlines.  Printed Furniture conservator/restorer.  Support groups or group counseling.  Text messaging programs.  Mobile phone applications.  Taking medicines to help you quit smoking. (If you are pregnant or breastfeeding, talk with your health care provider first.) Some medicines contain nicotine and some do not. Both types of medicines help with cravings, but the medicines that include nicotine help to relieve withdrawal symptoms. Your health care provider may recommend:  Nicotine patches, gum, or lozenges.  Nicotine inhalers or sprays.  Non-nicotine medicine that is taken by mouth. Talk with your health care provider about combining strategies, such as taking medicines while you are also receiving in-person counseling. Using these two strategies together makes you more likely to succeed in quitting than if you used either strategy on its own. If you are pregnant or breastfeeding, talk with your health care provider about finding counseling or other support strategies to quit smoking. Do not take medicine to help you quit smoking unless told to do so by your health care provider. WHAT THINGS CAN I DO TO MAKE IT EASIER TO QUIT? Quitting smoking might feel  overwhelming at first, but there is a lot that you can do to make it easier. Take these important actions:  Reach out to your family and friends and ask that they support and encourage you during this time. Call telephone quitlines, reach out to support groups, or work with a counselor for support.  Ask people who smoke to avoid smoking around you.  Avoid places that trigger you to smoke, such as bars, parties, or smoke-break areas at work.  Spend time around people who do  not smoke.  Lessen stress in your life, because stress can be a smoking trigger for some people. To lessen stress, try:  Exercising regularly.  Deep-breathing exercises.  Yoga.  Meditating.  Performing a body scan. This involves closing your eyes, scanning your body from head to toe, and noticing which parts of your body are particularly tense. Purposefully relax the muscles in those areas.  Download or purchase mobile phone or tablet apps (applications) that can help you stick to your quit plan by providing reminders, tips, and encouragement. There are many free apps, such as QuitGuide from the [employer] Office manager for Disease Control and Prevention). You can find other support for quitting smoking (smoking cessation) through smokefree.gov and other websites. HOW WILL I FEEL WHEN I QUIT SMOKING? Within the first 24 hours of quitting smoking, you may start to feel some withdrawal symptoms. These symptoms are usually most noticeable 2-3 days after quitting, but they usually do not last beyond 2-3 weeks. Changes or symptoms that you might experience include:  Mood swings.  Restlessness, anxiety, or irritation.  Difficulty concentrating.  Dizziness.  Strong cravings for sugary foods in addition to nicotine.  Mild weight gain.  Constipation.  Nausea.  Coughing or a sore throat.  Changes in how your medicines work in your body.  A depressed mood.  Difficulty sleeping (insomnia). After the first 2-3 weeks of  quitting, you may start to notice more positive results, such as:  Improved sense of smell and taste.  Decreased coughing and sore throat.  Slower heart rate.  Lower blood pressure.  Clearer skin.  The ability to breathe more easily.  Fewer sick days. Quitting smoking is very challenging for most people. Do not get discouraged if you are not successful the first time. Some people need to make many attempts to quit before they achieve long-term success. Do your best to stick to your quit plan, and talk with your health care provider if you have any questions or concerns.   This information is not intended to replace advice given to you by your health care provider. Make sure you discuss any questions you have with your health care provider.   Document Released: 06/03/2001 Document Revised: 10/24/2014 Document Reviewed: 10/24/2014 Elsevier Interactive Patient Education [employer].

## 2015-06-14 NOTE — Progress Notes (Signed)
[facility]  Established Abdominal Aortic Aneurysm  History of Present Illness  Kevin Frank is a 74 y.o. (10-Nov-1940) male patient of Dr. Trula Slade. The patient comes back today for followup of his infrarenal abdominal aortic aneurysm. This was first detected by MRI in 2008 for back pain.  He denies abdominal pain, denies back pain.  The patient continues to be medically managed for his diabetes.  Previous studies demonstrate an AAA, measuring 3.75 cm. The patient is a smoker. The patient denies claudication in legs with walking. The patient denies history of stroke or TIA symptoms. He denies any cardiac problems. He states he is very physically active, and drinks less than 14 ETOH drinks/week.  He states cartilage in both knees are about gone, reports that he saw an orthopod, but he seems to indicate moderate pain in both knees; "my biggest problem is getting up out of a chair".   Pt Diabetic: Yes, states his last A1C was 6.? Pt smoker: smoker (2-3 ppd, started smoking in his teens) Pt denies any adverse reaction to ASA, denies history of GI bleed or nose bleeds.    Past Medical History  Diagnosis Date  . Hyperlipidemia   . Hypertension   . AAA (abdominal aortic aneurysm) ([place]) 01/2008    3.2cm  . Obesity   . Hypercholesterolemia   . Shortness of breath     due to cigarette abuse  . Obstructive sleep apnea on CPAP   . Tobacco abuse   . Normal nuclear stress test 2011  . Diabetes mellitus     type II  . BPH (benign prostatic hyperplasia)   . Gout   . Presence of indwelling urinary catheter   . COPD (chronic obstructive pulmonary disease) [facility])    Past Surgical History  Procedure Laterality Date  . Cholecystectomy  1997    [facility]  . Transurethral resection of prostate  01/07/2011    Procedure: TRANSURETHRAL RESECTION OF THE PROSTATE (TURP);  Surgeon: Marissa Nestle;  Location: [facility];  Service: Urology;  Laterality: N/A;  .  Cholecystectomy  57   Social History Social History   Social History  . Marital Status: Married    Spouse Name: N/A  . Number of Children: N/A  . Years of Education: N/A   Occupational History  . Not on file.   Social History Main Topics  . Smoking status: Current Every Day Smoker -- 2.00 packs/day for 50 years    Types: Cigarettes  . Smokeless tobacco: Never Used  . Alcohol Use: 0.0 oz/week    0 Standard drinks or equivalent per week     Comment: occasional  . Drug Use: No  . Sexual Activity: Not on file   Other Topics Concern  . Not on file   Social History Narrative   Family History Family History  Problem Relation Age of Onset  . Liver disease Father   . Lymphoma Mother   . Diabetes Mother   . Hypertension Mother   . Cancer Mother   . Mental retardation      sibling -- from birth     Current Outpatient Prescriptions on File Prior to Visit  Medication Sig Dispense Refill  . albuterol (PROVENTIL HFA;VENTOLIN HFA) 108 (90 BASE) MCG/ACT inhaler Inhale 2 puffs into the lungs every 6 (six) hours as needed.    [name] allopurinol (ZYLOPRIM) 300 MG tablet Take 300 mg by mouth daily.      [name] amLODipine (NORVASC) 10 MG tablet TAKE  1 TABLET ONCE DAILY. 30 tablet 11  . atorvastatin (LIPITOR) 20 MG tablet     . furosemide (LASIX) 40 MG tablet Take 40 mg by mouth daily.      [name] losartan (COZAAR) 50 MG tablet TAKE 1 TABLET DAILY. 180 tablet 1  . metFORMIN (GLUCOPHAGE) 500 MG tablet Take 500 mg by mouth 2 (two) times daily with a meal.      . metoprolol succinate (TOPROL-XL) 25 MG 24 hr tablet TAKE 1 TABLET ONCE DAILY. 30 tablet 11  . Olopatadine HCl 0.6 % SOLN USE 1-2 SPRAYS IN EACH NOSTRIL EACH MORNING AND EVENING. 1 Bottle 5  . sertraline (ZOLOFT) 50 MG tablet Take 50 mg by mouth daily.      [name] SPIRIVA HANDIHALER 18 MCG inhalation capsule Place 18 mcg into inhaler and inhale as needed. For congestion/breathing    . SYMBICORT 160-4.5 MCG/ACT inhaler 2 PUFFS TWICE DAILY TO PREVENT  COUGH OR WHEEZE--RINSE, GARGLE AND SPIT AFTER USE. 10.2 g 2   No current facility-administered medications on file prior to visit.   No Known Allergies  ROS: See HPI for pertinent positives and negatives.  Physical Examination  Filed Vitals:   06/14/15 0842 06/14/15 0845  BP: 148/77 142/77  Pulse: 53   Temp: 98.3 F (36.8 C)   TempSrc: Oral   Resp: 16   Height: 5' 5.5" (1.664 m)   Weight: 228 lb 9.6 oz (103.692 kg)    Body mass index is 37.45 kg/(m^2).  General: A&O x 3, WD, obese male in NAD.  Pulmonary: Sym exp, decreased air movt, CTAB, no rales, rhonchi, or wheezing, chronic dry cough.  Cardiac: RRR, Nl S1, S2, no detected murmur.   Carotid Bruits Left Right   Negative Negative  Aorta is not palpable Radial pulses are 2+ palpable and =.   VASCULAR EXAM:     LE Pulses LEFT RIGHT   POPLITEAL not palpable  not palpable   POSTERIOR TIBIAL 1+ palpable  2+ palpable    DORSALIS PEDIS  ANTERIOR TIBIAL 2+ palpable  2+ palpable     Gastrointestinal: soft, NTND, -G/R, - HSM, - palpable masses, - CVAT B, large panus.  Musculoskeletal: M/S 5/5 throughout, Extremities without ischemic changes  Neurologic: CN 2-12 intact except some hearing loss, Pain and light touch intact in extremities are intact except, Motor exam as listed above.                Non-Invasive Vascular Imaging  AAA Duplex (06/14/2015)  Previous size: 4.52 cm (Date: 11/23/2014)  Current size:  4.54 cm (Date: 06/14/2015)  Medical Decision Making  The patient is a 74 y.o. male who presents with asymptomatic AAA with no increase in size. His primary risk factor for aneurysmal growth is continued smoking, 2-3 ppd. The patient was counseled re smoking cessation and given several  free resources re smoking cessation.   Based on this patient's exam and diagnostic studies, the patient will follow up in 6 months  with the following studies: AAA duplex.  Consideration for repair of AAA would be made when the size is 5.5 cm, growth > 1 cm/yr, and symptomatic status.  I emphasized the importance of maximal medical management including strict control of blood pressure, blood glucose, and lipid levels, antiplatelet agents, obtaining regular exercise, and  cessation of smoking.   The patient was given information about AAA including signs, symptoms, treatment, and how to minimize the risk of enlargement and rupture of aneurysms.    The patient was  advised to call 911 should the patient experience sudden onset abdominal or back pain.   Thank you for allowing [place] to participate in this patient's care.  Clemon Chambers, RN, MSN, FNP-C [facility] [place] Office: [phone]  Clinic Physician: Oneida Alar  06/14/2015, 8:57 AM

## 2015-06-14 NOTE — Progress Notes (Signed)
Filed Vitals:   06/14/15 0842 06/14/15 0845  BP: 148/77 142/77  Pulse: 53   Temp: 98.3 F (36.8 C)   TempSrc: Oral   Resp: 16   Height: 5' 5.5" (1.664 m)   Weight: 228 lb 9.6 oz (103.692 kg)

## 2015-06-15 NOTE — Addendum Note (Signed)
Addended by: Thresa Ross C on: 06/15/2015 10:27 AM   Modules accepted: Orders

## 2015-06-20 ENCOUNTER — Telehealth: Payer: Self-pay | Admitting: Neurology

## 2015-06-20 ENCOUNTER — Other Ambulatory Visit: Payer: Self-pay | Admitting: Neurology

## 2015-06-20 MED ORDER — AZELASTINE HCL 0.1 % NA SOLN: NASAL | Status: DC

## 2015-06-20 NOTE — Telephone Encounter (Signed)
Due to insurance coverage switched patient to Azelastine nasal spray per Dr Ishmael Holter. Called and left voicemail for patient to return phone call and notify of new RX sent in to pharmacy.

## 2015-06-21 NOTE — Telephone Encounter (Signed)
Patient notified and will pick up prescription at the pharmacy.

## 2015-06-25 ENCOUNTER — Other Ambulatory Visit: Payer: Self-pay | Admitting: Cardiovascular Disease

## 2015-07-31 ENCOUNTER — Ambulatory Visit (INDEPENDENT_AMBULATORY_CARE_PROVIDER_SITE_OTHER): Payer: Commercial Managed Care - HMO | Admitting: Allergy and Immunology

## 2015-07-31 ENCOUNTER — Encounter: Payer: Self-pay | Admitting: Allergy and Immunology

## 2015-07-31 VITALS — BP 132/74 | HR 78 | Temp 98.0°F | Resp 16

## 2015-07-31 DIAGNOSIS — J310 Chronic rhinitis: Secondary | ICD-10-CM | POA: Diagnosis not present

## 2015-07-31 DIAGNOSIS — R05 Cough: Secondary | ICD-10-CM | POA: Diagnosis not present

## 2015-07-31 DIAGNOSIS — J449 Chronic obstructive pulmonary disease, unspecified: Secondary | ICD-10-CM

## 2015-07-31 DIAGNOSIS — R059 Cough, unspecified: Secondary | ICD-10-CM

## 2015-07-31 MED ORDER — IPRATROPIUM BROMIDE 0.02 % IN SOLN: 0.5000 mg | Freq: Once | RESPIRATORY_TRACT | Status: DC

## 2015-07-31 MED ORDER — LEVALBUTEROL HCL 1.25 MG/3ML IN NEBU: 1.2500 mg | INHALATION_SOLUTION | Freq: Once | RESPIRATORY_TRACT | Status: DC

## 2015-07-31 NOTE — Progress Notes (Signed)
FOLLOW UP NOTE  RE: Kevin Frank: [id number] DOB: April 23, 1941 Kevin Frank. [address], [place] [address] Date of Office Visit: 07/31/2015  Subjective:  Kevin Frank is a 75 y.o. male who presents today for Cough  Assessment:   1. Cough,  Afebrile in no respiratory distress with her lung exam.   2. Chronic obstructive pulmonary disease.  3. Chronic rhinitis.   4.      Chronic tobacco use. 5.      History of sleep apnea with the CPAP concerns. Plan:   Meds ordered this encounter  Medications  . levalbuterol (XOPENEX) nebulizer solution 1.25 mg    Sig:   . ipratropium (ATROVENT) nebulizer solution 0.5 mg    Sig:    Patient Instructions   1.  Review with CPAP provider as discussed.  2.  Prednisone '30mg'$  today and '30mg'$  tomorrow.  3.  Continue Symbicort and Spiriva daily.  4.  As needed for cough or wheeze Ventolin 2 puffs every 4 hours.  5.  Saline nasal was 2-4 times daily.  6.  Consistently use Azelastine 1-2 sprays twice daily.   7.  Decrease cig use!!!!  8.  Follow-up in 2 months or sooner if needed.   HPI: Kevin Frank returns to the office with his wife concerning cough. Since his last visit in December he finds congestion a mix between nasal and chest.   Symptoms are typically in the morning without wheeze, difficulty breathing, shortness of breath, fever or discolored mucus. When he is active during the day there is very little congestion or drainage and no wheeze, shortness of breath, chest tightness or other concerns.  He has not used albuterol for the cough. He does find the nasal sprays beneficial and reports being consistent with his preventative medication regime. He has not decreased his cigarette use. His wife has questions regarding CPAP Apparatus but they have not contacted the provider.  No other recurring concerns or difficulty since his visit December. Denies ED or urgent care visits, prednisone or antibiotic courses.  Reports sleep and activity are normal.  Kevin Frank has a current medication list which includes the following prescription(s): albuterol, allopurinol, atorvastatin, azelastine, furosemide, losartan, metformin, metoprolol succinate, sertraline, spiriva handihaler, symbicort, amlodipine, and olopatadine hcl.   Drug Allergies: No Known Allergies  Objective:   Filed Vitals:   07/31/15 1136  BP: 132/74  Pulse: 78  Temp: 98 F (36.7 C)  Resp: 16   SpO2 Readings from Last 1 Encounters:  07/31/15 98%   Physical Exam  Constitutional: He is well-developed, well-nourished, and in no distress.  HENT:  Head: Atraumatic.  Right Ear: Tympanic membrane and ear canal normal.  Left Ear: Tympanic membrane and ear canal normal.  Nose: Mucosal edema present. No rhinorrhea. No epistaxis.  Mouth/Throat: Oropharynx is clear and moist and mucous membranes are normal. No oropharyngeal exudate, posterior oropharyngeal edema or posterior oropharyngeal erythema.  Eyes: Conjunctivae are normal.  Neck: Neck supple.  Cardiovascular: Normal rate, S1 normal and S2 normal.   No murmur heard. Pulmonary/Chest: Effort normal and breath sounds normal. He has no wheezes. He has no rhonchi. He has no rales.  Post Xopenex/Atrovent neb:  Continues to be clear without adventitious breath sounds.  Improved aeration and patient reports improved.  Lymphadenopathy:    He has no cervical adenopathy.  Skin: Skin is warm and intact. No rash noted. No cyanosis. Nails show no clubbing.   Diagnostics: Spirometry:  FVC 2.55--78%,  FEV1 2.14--87%, FEF 25-75 % 0.94--34%; postbronchodilator improvement, FVC 2.70--83%, FEV1 2.29--93%, FEF 25-75% 1.14--42%.    Damya Comley M. Ishmael Holter, MD    cc: Alonza Bogus, MD

## 2015-07-31 NOTE — Progress Notes (Deleted)
     FOLLOW UP NOTE  RE: ANVITH MAURIELLO MRN: [id number] DOB: [birth year] [facility] [address] [place] [place] [address] Date of Office Visit: 07/31/2015  Subjective:  Kevin Frank is a 75 y.o. male who presents today for Cough  Assessment:  No diagnosis found. Plan:  No orders of the defined types were placed in this encounter.   There are no Patient Instructions on file for this visit.   HPI: *** Denies ED or urgent care visits, prednisone or antibiotic courses. Reports sleep and activity are normal.  Pershing has a current medication list which includes the following prescription(s): albuterol, allopurinol, atorvastatin, azelastine, furosemide, losartan, metformin, metoprolol succinate, sertraline, spiriva handihaler, symbicort, amlodipine, and olopatadine hcl.   Drug Allergies: No Known Allergies  Objective:   Filed Vitals:   07/31/15 1136  BP: 132/74  Pulse: 78  Temp: 98 F (36.7 C)  Resp: 16   SpO2 Readings from Last 1 Encounters:  07/31/15 98%  ] Physical Exam  Diagnostics: Spirometry:  *** Skin testing:  ***   Kammi Hechler M. Ishmael Holter, MD  cc: Alonza Bogus, MD

## 2015-07-31 NOTE — Patient Instructions (Addendum)
   Review with CPAP provider as discussed.  Prednisone '30mg'$  today and '30mg'$  tomorrow.  Continue Symbicort and Spiriva daily.  As needed for cough or wheeze Ventolin 2 puffs every 4 hours.  Saline nasal was 2-4 times daily.  Azelastine 1-2 sprays twice daily.  Decrease cig use!!!!  Follow-up in 2 months or sooner if needed.

## 2015-08-09 ENCOUNTER — Telehealth: Payer: Self-pay

## 2015-08-09 ENCOUNTER — Other Ambulatory Visit: Payer: Self-pay | Admitting: Neurology

## 2015-08-09 ENCOUNTER — Other Ambulatory Visit: Payer: Self-pay | Admitting: Cardiovascular Disease

## 2015-08-09 MED ORDER — METOPROLOL SUCCINATE ER 25 MG PO TB24: 25.0000 mg | ORAL_TABLET | Freq: Every day | ORAL | Status: DC

## 2015-08-09 MED ORDER — BUDESONIDE-FORMOTEROL FUMARATE 160-4.5 MCG/ACT IN AERO: 2.0000 | INHALATION_SPRAY | Freq: Two times a day (BID) | RESPIRATORY_TRACT | Status: DC

## 2015-08-09 MED ORDER — AMLODIPINE BESYLATE 10 MG PO TABS: 10.0000 mg | ORAL_TABLET | Freq: Every day | ORAL | Status: DC

## 2015-08-09 NOTE — Telephone Encounter (Signed)
Patients wife called to see could we write a letter telling why Kevin Frank needs a CPAP Machine. He is in need of a new mask and she has found a new company to get it from, but they need a letter telling why he needs this before they can send him out one. Wife stated it was [facility] [phone]. Patient has [employer], and is seen by Dr. Ishmael Holter in [place].  Please Advise  Thanks

## 2015-08-10 NOTE — Telephone Encounter (Signed)
Called and spoke to patients wife and notified that she would need to get the physician who had wrote the order for the CPAP to do the letter. Wife said she would call him today and let us know if she needed anything else.

## 2015-08-23 DIAGNOSIS — J449 Chronic obstructive pulmonary disease, unspecified: Secondary | ICD-10-CM | POA: Diagnosis not present

## 2015-08-23 DIAGNOSIS — I10 Essential (primary) hypertension: Secondary | ICD-10-CM | POA: Diagnosis not present

## 2015-08-23 DIAGNOSIS — F17200 Nicotine dependence, unspecified, uncomplicated: Secondary | ICD-10-CM | POA: Diagnosis not present

## 2015-08-23 DIAGNOSIS — G4733 Obstructive sleep apnea (adult) (pediatric): Secondary | ICD-10-CM | POA: Diagnosis not present

## 2015-08-28 DIAGNOSIS — G4733 Obstructive sleep apnea (adult) (pediatric): Secondary | ICD-10-CM | POA: Diagnosis not present

## 2015-09-04 ENCOUNTER — Telehealth: Payer: Self-pay

## 2015-09-04 NOTE — Telephone Encounter (Signed)
Patient came in to office requesting we fax a 90 day rx for him.  Advised we do not fax prescriptions but will be glad to mail one to his home so he can mail in.  Please mail 90 day rx for Azelastine.

## 2015-09-05 ENCOUNTER — Other Ambulatory Visit: Payer: Self-pay

## 2015-09-05 MED ORDER — AZELASTINE HCL 0.1 % NA SOLN: NASAL | Status: DC

## 2015-09-05 NOTE — Telephone Encounter (Signed)
30 Day RX sent to mail order pharmacy and patient advised.

## 2015-09-07 DIAGNOSIS — Z8669 Personal history of other diseases of the nervous system and sense organs: Secondary | ICD-10-CM | POA: Diagnosis not present

## 2015-09-07 DIAGNOSIS — H6123 Impacted cerumen, bilateral: Secondary | ICD-10-CM | POA: Diagnosis not present

## 2015-09-11 DIAGNOSIS — Z0000 Encounter for general adult medical examination without abnormal findings: Secondary | ICD-10-CM | POA: Diagnosis not present

## 2015-09-11 DIAGNOSIS — Z1211 Encounter for screening for malignant neoplasm of colon: Secondary | ICD-10-CM | POA: Diagnosis not present

## 2015-09-19 DIAGNOSIS — Z125 Encounter for screening for malignant neoplasm of prostate: Secondary | ICD-10-CM | POA: Diagnosis not present

## 2015-09-19 DIAGNOSIS — I10 Essential (primary) hypertension: Secondary | ICD-10-CM | POA: Diagnosis not present

## 2015-09-19 DIAGNOSIS — E119 Type 2 diabetes mellitus without complications: Secondary | ICD-10-CM | POA: Diagnosis not present

## 2015-09-19 DIAGNOSIS — E785 Hyperlipidemia, unspecified: Secondary | ICD-10-CM | POA: Diagnosis not present

## 2015-09-20 ENCOUNTER — Other Ambulatory Visit: Payer: Self-pay | Admitting: *Deleted

## 2015-09-20 MED ORDER — LOSARTAN POTASSIUM 50 MG PO TABS: 50.0000 mg | ORAL_TABLET | Freq: Every day | ORAL | Status: DC

## 2015-09-28 DIAGNOSIS — G4733 Obstructive sleep apnea (adult) (pediatric): Secondary | ICD-10-CM | POA: Diagnosis not present

## 2015-10-02 ENCOUNTER — Ambulatory Visit (INDEPENDENT_AMBULATORY_CARE_PROVIDER_SITE_OTHER): Payer: Commercial Managed Care - HMO | Admitting: Allergy and Immunology

## 2015-10-02 ENCOUNTER — Encounter: Payer: Self-pay | Admitting: Allergy and Immunology

## 2015-10-02 VITALS — BP 136/78 | HR 96 | Temp 98.2°F | Resp 20

## 2015-10-02 DIAGNOSIS — J310 Chronic rhinitis: Secondary | ICD-10-CM

## 2015-10-02 DIAGNOSIS — J449 Chronic obstructive pulmonary disease, unspecified: Secondary | ICD-10-CM | POA: Diagnosis not present

## 2015-10-02 NOTE — Progress Notes (Signed)
     FOLLOW UP NOTE  RE: Kevin Frank MRN: [id number] DOB: January 16, 1941  [facility]. [address], [place] [address] Date of Office Visit: 10/02/2015  Subjective:  Kevin Frank is a 75 y.o. male who presents today for Cough  Assessment:   1. Chronic obstructive pulmonary disease, probable component of asthma.  2. Chronic rhinitis, improved control.  3.      Chronic tobacco use. Plan:   Patient Instructions  1.  Rehan will Continue current medication regime--- Symbicort, Spiriva and saline daily. 2.  Azelastine one spray twice daily as needed. 3.  Decrease cigarette use ---continued to encourage choosing a smoking quit date. 4.  Albuterol HFA as needed--call with any recurring use. 5.  Follow-up in 4-6 months or sooner if needed.  HPI: [name] returns to the office in follow-up of cough/wheeze history since symptomatic at his last visit in February. He has been able to maintain medication regime and feels it is benefiting him.  He reports only minimally morning cough with slight congestion which clears after his "morning coffee". Denies difficulty in breathing, shortness of breath, wheeze or need for albuterol at most once a week.  He continues to smoke nearly 2 packs a day and is not interested in discontinuing.  He feels the nasal spray is helpful, though doesn't use everyday. Reports no other concerns or questions.  He did obtain new CPAP machine and reports it has been very beneficial as sleep is much better.  No other new concerns today.  Denies ED or urgent care visits, prednisone or antibiotic courses. Reports sleep and activity are normal.  [facility] has a current medication list which includes the following prescription(s): albuterol, allopurinol, amlodipine, atorvastatin, azelastine, budesonide-formoterol, furosemide, losartan, metformin, metoprolol succinate, olopatadine hcl, sertraline, and spiriva handihaler.   Drug Allergies: No Known  Allergies  Objective:   Filed Vitals:   10/02/15 1132  BP: 136/78  Pulse: 96  Temp: 98.2 F (36.8 C)  Resp: 20   SpO2 Readings from Last 1 Encounters:  10/02/15 96%   Physical Exam  Constitutional: He is well-developed, well-nourished, and in no distress.  HENT:  Head: Atraumatic.  Right Ear: Tympanic membrane and ear canal normal.  Left Ear: Tympanic membrane and ear canal normal.  Nose: Mucosal edema present. No rhinorrhea. No epistaxis.  Mouth/Throat: Oropharynx is clear and moist and mucous membranes are normal. No oropharyngeal exudate, posterior oropharyngeal edema or posterior oropharyngeal erythema.  Eyes: Conjunctivae are normal.  Neck: Neck supple.  Cardiovascular: Normal rate, S1 normal and S2 normal.   No murmur heard. Pulmonary/Chest: Effort normal and breath sounds normal. He has no wheezes. He has no rhonchi. He has no rales.  Lymphadenopathy:    He has no cervical adenopathy.  Skin: Skin is warm and intact. No rash noted. No cyanosis. Nails show no clubbing.   Diagnostics: Spirometry:  FVC 2.88--89%, FEV1 2.09--85%.    Roselyn M. Ishmael Holter, MD  cc: Alonza Bogus, MD

## 2015-10-02 NOTE — Patient Instructions (Signed)
   Continue current medication regime--- Symbicort, Spiriva and saline daily.  Azelastine one spray twice daily as needed.  Decrease cigarette use.  Albuterol HFA as needed--call with any recurring use.  Follow-up in 4-6 months or sooner if needed.

## 2015-10-23 DIAGNOSIS — H2513 Age-related nuclear cataract, bilateral: Secondary | ICD-10-CM | POA: Diagnosis not present

## 2015-10-23 DIAGNOSIS — E119 Type 2 diabetes mellitus without complications: Secondary | ICD-10-CM | POA: Diagnosis not present

## 2015-10-28 DIAGNOSIS — G4733 Obstructive sleep apnea (adult) (pediatric): Secondary | ICD-10-CM | POA: Diagnosis not present

## 2015-11-01 DIAGNOSIS — H903 Sensorineural hearing loss, bilateral: Secondary | ICD-10-CM | POA: Diagnosis not present

## 2015-11-01 DIAGNOSIS — H9312 Tinnitus, left ear: Secondary | ICD-10-CM | POA: Diagnosis not present

## 2015-11-26 DIAGNOSIS — D485 Neoplasm of uncertain behavior of skin: Secondary | ICD-10-CM | POA: Diagnosis not present

## 2015-11-26 DIAGNOSIS — L570 Actinic keratosis: Secondary | ICD-10-CM | POA: Diagnosis not present

## 2015-11-26 DIAGNOSIS — Z85828 Personal history of other malignant neoplasm of skin: Secondary | ICD-10-CM | POA: Diagnosis not present

## 2015-11-27 ENCOUNTER — Other Ambulatory Visit: Payer: Self-pay

## 2015-11-27 MED ORDER — BUDESONIDE-FORMOTEROL FUMARATE 160-4.5 MCG/ACT IN AERO: 2.0000 | INHALATION_SPRAY | Freq: Two times a day (BID) | RESPIRATORY_TRACT | Status: DC

## 2015-11-27 NOTE — Telephone Encounter (Signed)
CALLED PATIENT ADVISED RX SENT

## 2015-11-27 NOTE — Telephone Encounter (Signed)
Patient last seen on 10/02/15-Kevin Frank, he is needing a refill on his symbicort sent it to [facility]. Pharmacy told patient to contact us.   Please Advise  Thanks

## 2015-11-28 DIAGNOSIS — G4733 Obstructive sleep apnea (adult) (pediatric): Secondary | ICD-10-CM | POA: Diagnosis not present

## 2015-12-13 ENCOUNTER — Encounter: Payer: Self-pay | Admitting: Family

## 2015-12-20 ENCOUNTER — Ambulatory Visit (INDEPENDENT_AMBULATORY_CARE_PROVIDER_SITE_OTHER): Payer: Commercial Managed Care - HMO | Admitting: Family

## 2015-12-20 ENCOUNTER — Ambulatory Visit (HOSPITAL_COMMUNITY)
Admission: RE | Admit: 2015-12-20 | Discharge: 2015-12-20 | Disposition: A | Discharge status: Home / Self Care | Payer: Commercial Managed Care - HMO | Source: Ambulatory Visit | Attending: Family | Admitting: Family

## 2015-12-20 ENCOUNTER — Encounter: Payer: Self-pay | Admitting: Family

## 2015-12-20 VITALS — BP 141/80 | HR 51 | Ht 65.5 in | Wt 227.8 lb

## 2015-12-20 DIAGNOSIS — I714 Abdominal aortic aneurysm, without rupture, unspecified: Secondary | ICD-10-CM

## 2015-12-20 DIAGNOSIS — E785 Hyperlipidemia, unspecified: Secondary | ICD-10-CM | POA: Insufficient documentation

## 2015-12-20 DIAGNOSIS — J449 Chronic obstructive pulmonary disease, unspecified: Secondary | ICD-10-CM | POA: Diagnosis not present

## 2015-12-20 DIAGNOSIS — E669 Obesity, unspecified: Secondary | ICD-10-CM | POA: Diagnosis not present

## 2015-12-20 DIAGNOSIS — E119 Type 2 diabetes mellitus without complications: Secondary | ICD-10-CM | POA: Diagnosis not present

## 2015-12-20 DIAGNOSIS — G4733 Obstructive sleep apnea (adult) (pediatric): Secondary | ICD-10-CM | POA: Insufficient documentation

## 2015-12-20 DIAGNOSIS — F17200 Nicotine dependence, unspecified, uncomplicated: Secondary | ICD-10-CM

## 2015-12-20 DIAGNOSIS — I10 Essential (primary) hypertension: Secondary | ICD-10-CM | POA: Diagnosis not present

## 2015-12-20 DIAGNOSIS — Z720 Tobacco use: Secondary | ICD-10-CM

## 2015-12-20 NOTE — Patient Instructions (Addendum)
Abdominal Aortic Aneurysm An aneurysm is a weakened or damaged part of an artery wall that bulges from the normal force of blood pumping through the body. An abdominal aortic aneurysm is an aneurysm that occurs in the lower part of the aorta, the main artery of the body.  The major concern with an abdominal aortic aneurysm is that it can enlarge and burst (rupture) or blood can flow between the layers of the wall of the aorta through a tear (aorticdissection). Both of these conditions can cause bleeding inside the body and can be life threatening unless diagnosed and treated promptly. CAUSES  The exact cause of an abdominal aortic aneurysm is unknown. Some contributing factors are:   A hardening of the arteries caused by the buildup of fat and other substances in the lining of a blood vessel (arteriosclerosis).  Inflammation of the walls of an artery (arteritis).   Connective tissue diseases, such as Marfan syndrome.   Abdominal trauma.   An infection, such as syphilis or staphylococcus, in the wall of the aorta (infectious aortitis) caused by bacteria. RISK FACTORS  Risk factors that contribute to an abdominal aortic aneurysm may include:  Age older than 60 years.   High blood pressure (hypertension).  Male gender.  Ethnicity (white race).  Obesity.  Family history of aneurysm (first degree relatives only).  Tobacco use. PREVENTION  The following healthy lifestyle habits may help decrease your risk of abdominal aortic aneurysm:  Quitting smoking. Smoking can raise your blood pressure and cause arteriosclerosis.  Limiting or avoiding alcohol.  Keeping your blood pressure, blood sugar level, and cholesterol levels within normal limits.  Decreasing your salt intake. In somepeople, too much salt can raise blood pressure and increase your risk of abdominal aortic aneurysm.  Eating a diet low in saturated fats and cholesterol.  Increasing your fiber intake by including  whole grains, vegetables, and fruits in your diet. Eating these foods may help lower blood pressure.  Maintaining a healthy weight.  Staying physically active and exercising regularly. SYMPTOMS  The symptoms of abdominal aortic aneurysm may vary depending on the size and rate of growth of the aneurysm.Most grow slowly and do not have any symptoms. When symptoms do occur, they may include:  Pain (abdomen, side, lower back, or groin). The pain may vary in intensity. A sudden onset of severe pain may indicate that the aneurysm has ruptured.  Feeling full after eating only small amounts of food.  Nausea or vomiting or both.  Feeling a pulsating lump in the abdomen.  Feeling faint or passing out. DIAGNOSIS  Since most unruptured abdominal aortic aneurysms have no symptoms, they are often discovered during diagnostic exams for other conditions. An aneurysm may be found during the following procedures:  Ultrasonography (A one-time screening for abdominal aortic aneurysm by ultrasonography is also recommended for all men aged 65-75 years who have ever smoked).  X-ray exams.  A computed tomography (CT).  Magnetic resonance imaging (MRI).  Angiography or arteriography. TREATMENT  Treatment of an abdominal aortic aneurysm depends on the size of your aneurysm, your age, and risk factors for rupture. Medication to control blood pressure and pain may be used to manage aneurysms smaller than 6 cm. Regular monitoring for enlargement may be recommended by your caregiver if:  The aneurysm is 3-4 cm in size (an annual ultrasonography may be recommended).  The aneurysm is 4-4.5 cm in size (an ultrasonography every 6 months may be recommended).  The aneurysm is larger than 4.5 cm in   size (your caregiver may ask that you be examined by a vascular surgeon). If your aneurysm is larger than 6 cm, surgical repair may be recommended. There are two main methods for repair of an aneurysm:   Endovascular  repair (a minimally invasive surgery). This is done most often.  Open repair. This method is used if an endovascular repair is not possible.   This information is not intended to replace advice given to you by your health care provider. Make sure you discuss any questions you have with your health care provider.   Document Released: 03/19/2005 Document Revised: 10/04/2012 Document Reviewed: 07/09/2012 Elsevier Interactive Patient Education 2016 [employer].     Steps to Quit Smoking  Smoking tobacco can be harmful to your health and can affect almost every organ in your body. Smoking puts you, and those around you, at risk for developing many serious chronic diseases. Quitting smoking is difficult, but it is one of the best things that you can do for your health. It is never too late to quit. WHAT ARE THE BENEFITS OF QUITTING SMOKING? When you quit smoking, you lower your risk of developing serious diseases and conditions, such as:  Lung cancer or lung disease, such as COPD.  Heart disease.  Stroke.  Heart attack.  Infertility.  Osteoporosis and bone fractures. Additionally, symptoms such as coughing, wheezing, and shortness of breath may get better when you quit. You may also find that you get sick less often because your body is stronger at fighting off colds and infections. If you are pregnant, quitting smoking can help to reduce your chances of having a baby of low birth weight. HOW DO I GET READY TO QUIT? When you decide to quit smoking, create a plan to make sure that you are successful. Before you quit:  Pick a date to quit. Set a date within the next two weeks to give you time to prepare.  Write down the reasons why you are quitting. Keep this list in places where you will see it often, such as on your bathroom mirror or in your car or wallet.  Identify the people, places, things, and activities that make you want to smoke (triggers) and avoid them. Make sure to take  these actions:  Throw away all cigarettes at home, at work, and in your car.  Throw away smoking accessories, such as Scientist, research (medical).  Clean your car and make sure to empty the ashtray.  Clean your home, including curtains and carpets.  Tell your family, friends, and coworkers that you are quitting. Support from your loved ones can make quitting easier.  Talk with your health care provider about your options for quitting smoking.  Find out what treatment options are covered by your health insurance. WHAT STRATEGIES CAN I USE TO QUIT SMOKING?  Talk with your healthcare provider about different strategies to quit smoking. Some strategies include:  Quitting smoking altogether instead of gradually lessening how much you smoke over a period of time. Research shows that quitting "cold [place]" is more successful than gradually quitting.  Attending in-person counseling to help you build problem-solving skills. You are more likely to have success in quitting if you attend several counseling sessions. Even short sessions of 10 minutes can be effective.  Finding resources and support systems that can help you to quit smoking and remain smoke-free after you quit. These resources are most helpful when you use them often. They can include:  Online chats with a Social worker.  Telephone  quitlines.  Printed Furniture conservator/restorer.  Support groups or group counseling.  Text messaging programs.  Mobile phone applications.  Taking medicines to help you quit smoking. (If you are pregnant or breastfeeding, talk with your health care provider first.) Some medicines contain nicotine and some do not. Both types of medicines help with cravings, but the medicines that include nicotine help to relieve withdrawal symptoms. Your health care provider may recommend:  Nicotine patches, gum, or lozenges.  Nicotine inhalers or sprays.  Non-nicotine medicine that is taken by mouth. Talk with your health care  provider about combining strategies, such as taking medicines while you are also receiving in-person counseling. Using these two strategies together makes you more likely to succeed in quitting than if you used either strategy on its own. If you are pregnant or breastfeeding, talk with your health care provider about finding counseling or other support strategies to quit smoking. Do not take medicine to help you quit smoking unless told to do so by your health care provider. WHAT THINGS CAN I DO TO MAKE IT EASIER TO QUIT? Quitting smoking might feel overwhelming at first, but there is a lot that you can do to make it easier. Take these important actions:  Reach out to your family and friends and ask that they support and encourage you during this time. Call telephone quitlines, reach out to support groups, or work with a counselor for support.  Ask people who smoke to avoid smoking around you.  Avoid places that trigger you to smoke, such as bars, parties, or smoke-break areas at work.  Spend time around people who do not smoke.  Lessen stress in your life, because stress can be a smoking trigger for some people. To lessen stress, try:  Exercising regularly.  Deep-breathing exercises.  Yoga.  Meditating.  Performing a body scan. This involves closing your eyes, scanning your body from head to toe, and noticing which parts of your body are particularly tense. Purposefully relax the muscles in those areas.  Download or purchase mobile phone or tablet apps (applications) that can help you stick to your quit plan by providing reminders, tips, and encouragement. There are many free apps, such as QuitGuide from the [employer] Office manager for Disease Control and Prevention). You can find other support for quitting smoking (smoking cessation) through smokefree.gov and other websites. HOW WILL I FEEL WHEN I QUIT SMOKING? Within the first 24 hours of quitting smoking, you may start to feel some withdrawal  symptoms. These symptoms are usually most noticeable 2-3 days after quitting, but they usually do not last beyond 2-3 weeks. Changes or symptoms that you might experience include:  Mood swings.  Restlessness, anxiety, or irritation.  Difficulty concentrating.  Dizziness.  Strong cravings for sugary foods in addition to nicotine.  Mild weight gain.  Constipation.  Nausea.  Coughing or a sore throat.  Changes in how your medicines work in your body.  A depressed mood.  Difficulty sleeping (insomnia). After the first 2-3 weeks of quitting, you may start to notice more positive results, such as:  Improved sense of smell and taste.  Decreased coughing and sore throat.  Slower heart rate.  Lower blood pressure.  Clearer skin.  The ability to breathe more easily.  Fewer sick days. Quitting smoking is very challenging for most people. Do not get discouraged if you are not successful the first time. Some people need to make many attempts to quit before they achieve long-term success. Do your best to stick to  your quit plan, and talk with your health care provider if you have any questions or concerns.   This information is not intended to replace advice given to you by your health care provider. Make sure you discuss any questions you have with your health care provider.   Document Released: 06/03/2001 Document Revised: 10/24/2014 Document Reviewed: 10/24/2014 Elsevier Interactive Patient Education [employer].

## 2015-12-20 NOTE — Progress Notes (Signed)
[facility] [place]   CC: Follow up Abdominal Aortic Aneurysm  History of Present Illness  Kevin Frank is a 75 y.o. ([birth year]) male patient of Dr. Trula Slade. The patient comes back today for followup of his infrarenal abdominal aortic aneurysm. This was first detected by MRI in 2008 for back pain.  He denies abdominal pain, denies back pain.  The patient continues to be medically managed for his diabetes.  Pt has seen Dr. Sherren Mocha, cardiologist, last visit was September 2016, for htn, dyslipidemia; no stress test result on file.   Previous studies demonstrate an AAA, measuring 3.75 cm. The patient is a smoker. The patient denies claudication in legs with walking. The patient denies history of stroke or TIA symptoms. He denies any cardiac problems. He states he is very physically active.  He states cartilage in both knees are about gone, reports that he saw an orthopod, but he seems to indicate moderate pain in both knees; "my biggest problem is getting up out of a chair".   Pt Diabetic: Yes, states his last A1C was 6.? Pt smoker: smoker (2-3 ppd, started smoking in his teens) Pt denies any adverse reaction to ASA, denies history of GI bleed or nose bleeds.    Past Medical History  Diagnosis Date  . Hyperlipidemia   . Hypertension   . AAA (abdominal aortic aneurysm) ([place]) 01/2008    3.2cm  . Obesity   . Hypercholesterolemia   . Shortness of breath     due to cigarette abuse  . Obstructive sleep apnea on CPAP   . Tobacco abuse   . Normal nuclear stress test 2011  . Diabetes mellitus     type II  . BPH (benign prostatic hyperplasia)   . Gout   . Presence of indwelling urinary catheter   . COPD (chronic obstructive pulmonary disease) [facility])    Past Surgical History  Procedure Laterality Date  . Cholecystectomy  1997    [facility]  . Transurethral resection of prostate  01/07/2011    Procedure: TRANSURETHRAL RESECTION OF THE PROSTATE (TURP);   Surgeon: Marissa Nestle;  Location: [facility];  Service: Urology;  Laterality: N/A;  . Cholecystectomy  26   Social History Social History   Social History  . Marital Status: Married    Spouse Name: N/A  . Number of Children: N/A  . Years of Education: N/A   Occupational History  . Not on file.   Social History Main Topics  . Smoking status: Current Every Day Smoker -- 2.00 packs/day for 50 years    Types: Cigarettes  . Smokeless tobacco: Never Used  . Alcohol Use: 0.0 oz/week    0 Standard drinks or equivalent per week     Comment: occasional  . Drug Use: No  . Sexual Activity: Not on file   Other Topics Concern  . Not on file   Social History Narrative   Family History Family History  Problem Relation Age of Onset  . Liver disease Father   . Lymphoma Mother   . Diabetes Mother   . Hypertension Mother   . Cancer Mother   . Mental retardation      sibling -- from birth     Current Outpatient Prescriptions on File Prior to Visit  Medication Sig Dispense Refill  . albuterol (PROVENTIL HFA;VENTOLIN HFA) 108 (90 BASE) MCG/ACT inhaler Inhale 2 puffs into the lungs every 6 (six) hours as needed.    [name] allopurinol (ZYLOPRIM) 300 MG  tablet Take 300 mg by mouth daily.      [name] amLODipine (NORVASC) 10 MG tablet Take 1 tablet (10 mg total) by mouth daily. 90 tablet 2  . atorvastatin (LIPITOR) 20 MG tablet     . azelastine (ASTELIN) 0.1 % nasal spray Use 1-2 sprays in each nostril as directed each morning and evening 90 mL 1  . budesonide-formoterol (SYMBICORT) 160-4.5 MCG/ACT inhaler Inhale 2 puffs into the lungs 2 (two) times daily. 3 Inhaler 1  . furosemide (LASIX) 40 MG tablet Take 40 mg by mouth daily.      [name] losartan (COZAAR) 50 MG tablet Take 1 tablet (50 mg total) by mouth daily. 90 tablet 3  . metFORMIN (GLUCOPHAGE) 500 MG tablet Take 500 mg by mouth 2 (two) times daily with a meal.      . metoprolol succinate (TOPROL-XL) 25 MG 24 hr tablet Take 1 tablet (25 mg total)  by mouth daily. 90 tablet 2  . sertraline (ZOLOFT) 50 MG tablet Take 50 mg by mouth daily.      [name] SPIRIVA HANDIHALER 18 MCG inhalation capsule Place 18 mcg into inhaler and inhale as needed. For congestion/breathing    . Olopatadine HCl 0.6 % SOLN USE 1-2 SPRAYS IN EACH NOSTRIL EACH MORNING AND EVENING. (Patient not taking: Reported on 10/03/2015) 1 Bottle 5   Current Facility-Administered Medications on File Prior to Visit  Medication Dose Route Frequency Provider Last Rate Last Dose  . ipratropium (ATROVENT) nebulizer solution 0.5 mg  0.5 mg Nebulization Once Roselyn Malachy Moan, MD      . levalbuterol Penne Lash) nebulizer solution 1.25 mg  1.25 mg Nebulization Once Roselyn Malachy Moan, MD       No Known Allergies  ROS: See HPI for pertinent positives and negatives.  Physical Examination  Filed Vitals:   12/20/15 0857  BP: 143/79  Pulse: 51  Height: 5' 5.5" (1.664 m)  Weight: 227 lb 12.8 oz (103.329 kg)  SpO2: 95%   Body mass index is 37.32 kg/(m^2).  General: A&O x 3, WD, obese male in NAD.  Pulmonary: Sym exp, decreased air movt, CTAB, no rales, rhonchi, or wheezing, chronic dry cough.  Cardiac: RRR, Nl S1, S2, no detected murmur.   Carotid Bruits Left Right   Negative Negative  Aorta is not palpable Radial pulses are 2+ palpable and =.   VASCULAR EXAM:     LE Pulses LEFT RIGHT   POPLITEAL not palpable  not palpable   POSTERIOR TIBIAL 1+ palpable  2+ palpable    DORSALIS PEDIS  ANTERIOR TIBIAL 2+ palpable  2+ palpable     Gastrointestinal: soft, NTND, -G/R, - HSM, - palpable masses, - CVAT B, large panus.  Musculoskeletal: M/S 5/5 throughout, Extremities without ischemic changes  Neurologic: CN 2-12 intact except some hearing loss, Pain  and light touch intact in extremities are intact except, Motor exam as listed above.                     Non-Invasive Vascular Imaging  AAA Duplex (12/20/2015)  Previous size: 4.54 cm (Date: 06/14/2015)  Current size:  4.9 cm (Date: 12/20/2015)  Medical Decision Making  The patient is a 75 y.o. male who presents with asymptomatic AAA with increase in size from 4.54 cm on 06/14/15 to 4.9 cm today, limited visualization of the abdominal vasculature due to overlying bowel gas and body habitus (large abdomen). The AAA duplex on 06/14/15 was also technically difficult due to  overlying bowel gas  and body habitus. His primary risk factor for aneurysmal growth is continued smoking, 2-3 ppd. The patient was counseled re smoking cessation and given several free resources re smoking cessation.    Based on this patient's exam and diagnostic studies, and after discussing with Dr. Oneida Alar, the patient will follow up in 1 month  with the following studies: CTA abd/pelvis, see me afterward on a day that Dr. Trula Slade is in the office.  Consideration for repair of AAA would be made when the size is 5.5 cm, growth > 1 cm/yr, and symptomatic status.  I emphasized the importance of maximal medical management including strict control of blood pressure, blood glucose, and lipid levels, antiplatelet agents, obtaining regular exercise, and cessation of smoking.   The patient was given information about AAA including signs, symptoms, treatment, and how to minimize the risk of enlargement and rupture of aneurysms.    The patient was advised to call 911 should the patient experience sudden onset abdominal or back pain.   Thank you for allowing [place] to participate in this patient's care.  Clemon Chambers, RN, MSN, FNP-C [facility] [place] Office: [facility] Physician: Oneida Alar  12/20/2015, 8:59 AM

## 2015-12-20 NOTE — Addendum Note (Signed)
Addended by: Reola Calkins on: 12/20/2015 03:55 PM   Modules accepted: Orders

## 2015-12-28 DIAGNOSIS — G4733 Obstructive sleep apnea (adult) (pediatric): Secondary | ICD-10-CM | POA: Diagnosis not present

## 2015-12-28 DIAGNOSIS — J441 Chronic obstructive pulmonary disease with (acute) exacerbation: Secondary | ICD-10-CM | POA: Diagnosis not present

## 2016-01-09 ENCOUNTER — Telehealth: Payer: Self-pay

## 2016-01-09 DIAGNOSIS — J449 Chronic obstructive pulmonary disease, unspecified: Secondary | ICD-10-CM

## 2016-01-09 DIAGNOSIS — H9312 Tinnitus, left ear: Secondary | ICD-10-CM | POA: Diagnosis not present

## 2016-01-09 DIAGNOSIS — H903 Sensorineural hearing loss, bilateral: Secondary | ICD-10-CM | POA: Diagnosis not present

## 2016-01-09 NOTE — Telephone Encounter (Signed)
Patients daughter Sharyn Lull called to see if Dr. Ishmael Holter could refer her father to a great pulmonologist. She had one in mind Dr. Halford Chessman. Pt has been coughing so much with his COPD.   Please advise

## 2016-01-09 NOTE — Telephone Encounter (Signed)
Yes  Dr. Chase Caller

## 2016-01-10 ENCOUNTER — Encounter: Payer: Self-pay | Admitting: Family

## 2016-01-10 NOTE — Telephone Encounter (Signed)
Informed patient daughter of referral.

## 2016-01-10 NOTE — Telephone Encounter (Addendum)
Sent referral to Dr. Chase Caller

## 2016-01-17 ENCOUNTER — Telehealth: Payer: Self-pay | Admitting: Allergy and Immunology

## 2016-01-17 NOTE — Telephone Encounter (Signed)
Pt. Daughter called about his medicine. She said he is in the donut hole? Paying almost $900 for medicine. She would like to see if there are other options.

## 2016-01-17 NOTE — Telephone Encounter (Signed)
Spoke with daughter told her we can give him some samples.

## 2016-01-21 ENCOUNTER — Ambulatory Visit
Admission: RE | Admit: 2016-01-21 | Discharge: 2016-01-21 | Disposition: A | Discharge status: Home / Self Care | Payer: Commercial Managed Care - HMO | Source: Ambulatory Visit | Attending: Family | Admitting: Family

## 2016-01-21 ENCOUNTER — Encounter: Payer: Self-pay | Admitting: Family

## 2016-01-21 ENCOUNTER — Ambulatory Visit (INDEPENDENT_AMBULATORY_CARE_PROVIDER_SITE_OTHER): Payer: Commercial Managed Care - HMO | Admitting: Surgery

## 2016-01-21 VITALS — BP 145/80 | HR 64 | Resp 18 | Ht 65.5 in | Wt 224.0 lb

## 2016-01-21 DIAGNOSIS — I714 Abdominal aortic aneurysm, without rupture, unspecified: Secondary | ICD-10-CM

## 2016-01-21 DIAGNOSIS — E669 Obesity, unspecified: Secondary | ICD-10-CM

## 2016-01-21 DIAGNOSIS — F17200 Nicotine dependence, unspecified, uncomplicated: Secondary | ICD-10-CM

## 2016-01-21 DIAGNOSIS — Z720 Tobacco use: Secondary | ICD-10-CM

## 2016-01-21 MED ORDER — IOPAMIDOL (ISOVUE-370) INJECTION 76%
75.0000 mL | Freq: Once | INTRAVENOUS | Status: AC | PRN
  Administered 2016-01-21: 75 mL via INTRAVENOUS

## 2016-01-21 NOTE — Progress Notes (Signed)
[facility] [place]  Patient name: Kevin Frank MRN: [id number] DOB: 08/01/1940 Sex: male  REASON FOR VISIT: Follow-up abdominal aortic aneurysm  HPI: Kevin Frank is a 75 y.o. male returns today for follow-up of his abdominal aortic aneurysm which was first detected by MRI in 2008 when he presented with back pain.  We had difficulty seeing through bowel gas on ultrasound and therefore he has a CT scan today.  The patient continues to be medically managed for diabetes.  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  He is on a statin for hypercholesterolemia.  He is medically managed for hypertension.  Past Medical History:  Diagnosis Date  . AAA (abdominal aortic aneurysm) ([place]) 01/2008   3.2cm  . BPH (benign prostatic hyperplasia)   . COPD (chronic obstructive pulmonary disease) ([place])   . Diabetes mellitus    type II  . Gout   . Hypercholesterolemia   . Hyperlipidemia   . Hypertension   . Normal nuclear stress test 2011  . Obesity   . Obstructive sleep apnea on CPAP   . Presence of indwelling urinary catheter   . Shortness of breath    due to cigarette abuse  . Tobacco abuse     Family History  Problem Relation Age of Onset  . Liver disease Father   . Lymphoma Mother   . Diabetes Mother   . Hypertension Mother   . Cancer Mother   . Mental retardation      sibling -- from birth     SOCIAL HISTORY: Social History  Substance Use Topics  . Smoking status: Current Every Day Smoker    Packs/day: 2.00    Years: 50.00    Types: Cigarettes  . Smokeless tobacco: Never Used  . Alcohol use 0.0 oz/week     Comment: occasional    No Known Allergies  Current Outpatient Prescriptions  Medication Sig Dispense Refill  . albuterol (PROVENTIL HFA;VENTOLIN HFA) 108 (90 BASE) MCG/ACT inhaler Inhale 2 puffs into the lungs every 6 (six) hours as needed.    [name] allopurinol (ZYLOPRIM) 300 MG tablet Take 300 mg by  mouth daily.      [name] amLODipine (NORVASC) 10 MG tablet Take 1 tablet (10 mg total) by mouth daily. 90 tablet 2  . atorvastatin (LIPITOR) 20 MG tablet     . azelastine (ASTELIN) 0.1 % nasal spray Use 1-2 sprays in each nostril as directed each morning and evening 90 mL 1  . budesonide-formoterol (SYMBICORT) 160-4.5 MCG/ACT inhaler Inhale 2 puffs into the lungs 2 (two) times daily. 3 Inhaler 1  . furosemide (LASIX) 40 MG tablet Take 40 mg by mouth daily.      [name] losartan (COZAAR) 50 MG tablet Take 1 tablet (50 mg total) by mouth daily. 90 tablet 3  . metFORMIN (GLUCOPHAGE) 500 MG tablet Take 500 mg by mouth 2 (two) times daily with a meal.      . metoprolol succinate (TOPROL-XL) 25 MG 24 hr tablet Take 1 tablet (25 mg total) by mouth daily. 90 tablet 2  . Olopatadine HCl 0.6 % SOLN USE 1-2 SPRAYS IN EACH NOSTRIL EACH MORNING AND EVENING. 1 Bottle 5  . sertraline (ZOLOFT) 50 MG tablet Take 50 mg by mouth daily.      [name] SPIRIVA HANDIHALER 18 MCG inhalation capsule Place 18 mcg into inhaler and inhale as needed. For congestion/breathing     Current Facility-Administered Medications  Medication Dose Route Frequency Provider Last Rate  Last Dose  . ipratropium (ATROVENT) nebulizer solution 0.5 mg  0.5 mg Nebulization Once Roselyn Malachy Moan, MD      . levalbuterol Penne Lash) nebulizer solution 1.25 mg  1.25 mg Nebulization Once Roselyn Malachy Moan, MD        REVIEW OF SYSTEMS:  '[X]'$  denotes positive finding, '[ ]'$  denotes negative finding Cardiac  Comments:  Chest pain or chest pressure:    Shortness of breath upon exertion:    Short of breath when lying flat:    Irregular heart rhythm:        Vascular    Pain in calf, thigh, or hip brought on by ambulation:    Pain in feet at night that wakes you up from your sleep:     Blood clot in your veins:    Leg swelling:         Pulmonary    Oxygen at home:    Productive cough:  x   Wheezing:         Neurologic    Sudden weakness in arms or legs:      Sudden numbness in arms or legs:     Sudden onset of difficulty speaking or slurred speech:    Temporary loss of vision in one eye:     Problems with dizziness:         Gastrointestinal    Blood in stool:     Vomited blood:         Genitourinary    Burning when urinating:     Blood in urine:        Psychiatric    Major depression:         Hematologic    Bleeding problems:    Problems with blood clotting too easily:        Skin    Rashes or ulcers:        Constitutional    Fever or chills:      PHYSICAL EXAM: Vitals:   01/21/16 1506 01/21/16 1508  BP: (!) 153/79 (!) 145/80  Pulse: 64   Resp: 18   SpO2: 92%   Weight: 224 lb (101.6 kg)   Height: 5' 5.5" (1.664 m)     GENERAL: The patient is a well-nourished male, in no acute distress. The vital signs are documented above. CARDIAC: There is a regular rate and rhythm.  PULMONARY: There is good air exchange bilaterally without wheezing or rales. ABDOMEN: Soft and non-tender with normal pitched bowel sounds.  MUSCULOSKELETAL: There are no major deformities or cyanosis. NEUROLOGIC: No focal weakness or paresthesias are detected. SKIN: There are no ulcers or rashes noted. PSYCHIATRIC: The patient has a normal affect.  DATA:  I have reviewed his CT scan which shows a juxtarenal 4.9-5.0 cm aneurysm.  MEDICAL ISSUES: I discussed with the patient and his wife of 48 years that his aneurysm is getting to the point where we need to consider repair.  By my measurement it is just over 5 cm.  I do not think that he has adequate infrarenal neck for a standard stent graft it would need to consider fenestrated repair.  I will need to get this further evaluated on Tera Recon.  I'm going to contact him once I have seen whether or not he is a candidate for a fenestrated repair.  If he is, he will need to get carotid Doppler studies, arterial evaluation of his popliteal arteries as well as baseline ankle-brachial indices.  I will also get  cardiac  clearance from Dr. Burt Knack.  He did have a CT scan with an indeterminate lung nodule which will need to be further evaluated.  (H) [phone]    Annamarie Major, MD [facility] [phone] Pager [phone]

## 2016-01-21 NOTE — Patient Instructions (Signed)
Abdominal Aortic Aneurysm An aneurysm is a weakened or damaged part of an artery wall that bulges from the normal force of blood pumping through the body. An abdominal aortic aneurysm is an aneurysm that occurs in the lower part of the aorta, the main artery of the body.  The major concern with an abdominal aortic aneurysm is that it can enlarge and burst (rupture) or blood can flow between the layers of the wall of the aorta through a tear (aorticdissection). Both of these conditions can cause bleeding inside the body and can be life threatening unless diagnosed and treated promptly. CAUSES  The exact cause of an abdominal aortic aneurysm is unknown. Some contributing factors are:   A hardening of the arteries caused by the buildup of fat and other substances in the lining of a blood vessel (arteriosclerosis).  Inflammation of the walls of an artery (arteritis).   Connective tissue diseases, such as Marfan syndrome.   Abdominal trauma.   An infection, such as syphilis or staphylococcus, in the wall of the aorta (infectious aortitis) caused by bacteria. RISK FACTORS  Risk factors that contribute to an abdominal aortic aneurysm may include:  Age older than 60 years.   High blood pressure (hypertension).  Male gender.  Ethnicity (white race).  Obesity.  Family history of aneurysm (first degree relatives only).  Tobacco use. PREVENTION  The following healthy lifestyle habits may help decrease your risk of abdominal aortic aneurysm:  Quitting smoking. Smoking can raise your blood pressure and cause arteriosclerosis.  Limiting or avoiding alcohol.  Keeping your blood pressure, blood sugar level, and cholesterol levels within normal limits.  Decreasing your salt intake. In somepeople, too much salt can raise blood pressure and increase your risk of abdominal aortic aneurysm.  Eating a diet low in saturated fats and cholesterol.  Increasing your fiber intake by including  whole grains, vegetables, and fruits in your diet. Eating these foods may help lower blood pressure.  Maintaining a healthy weight.  Staying physically active and exercising regularly. SYMPTOMS  The symptoms of abdominal aortic aneurysm may vary depending on the size and rate of growth of the aneurysm.Most grow slowly and do not have any symptoms. When symptoms do occur, they may include:  Pain (abdomen, side, lower back, or groin). The pain may vary in intensity. A sudden onset of severe pain may indicate that the aneurysm has ruptured.  Feeling full after eating only small amounts of food.  Nausea or vomiting or both.  Feeling a pulsating lump in the abdomen.  Feeling faint or passing out. DIAGNOSIS  Since most unruptured abdominal aortic aneurysms have no symptoms, they are often discovered during diagnostic exams for other conditions. An aneurysm may be found during the following procedures:  Ultrasonography (A one-time screening for abdominal aortic aneurysm by ultrasonography is also recommended for all men aged 65-75 years who have ever smoked).  X-ray exams.  A computed tomography (CT).  Magnetic resonance imaging (MRI).  Angiography or arteriography. TREATMENT  Treatment of an abdominal aortic aneurysm depends on the size of your aneurysm, your age, and risk factors for rupture. Medication to control blood pressure and pain may be used to manage aneurysms smaller than 6 cm. Regular monitoring for enlargement may be recommended by your caregiver if:  The aneurysm is 3-4 cm in size (an annual ultrasonography may be recommended).  The aneurysm is 4-4.5 cm in size (an ultrasonography every 6 months may be recommended).  The aneurysm is larger than 4.5 cm in   size (your caregiver may ask that you be examined by a vascular surgeon). If your aneurysm is larger than 6 cm, surgical repair may be recommended. There are two main methods for repair of an aneurysm:   Endovascular  repair (a minimally invasive surgery). This is done most often.  Open repair. This method is used if an endovascular repair is not possible.   This information is not intended to replace advice given to you by your health care provider. Make sure you discuss any questions you have with your health care provider.   Document Released: 03/19/2005 Document Revised: 10/04/2012 Document Reviewed: 07/09/2012 Elsevier Interactive Patient Education 2016 [employer]vier Inc.  

## 2016-01-22 ENCOUNTER — Telehealth: Payer: Self-pay | Admitting: Family

## 2016-01-22 NOTE — Telephone Encounter (Signed)
I spoke with Kevin Frank. He had questions re whether our office could do the testing recommended by Dr. Trula Slade and possibly mentioned to pt by Dr. Burt Knack. Pt states he has an appointment to see Dr. Burt Knack next month. According to Dr. Stephens Shire assessment of 01/21/16, patient will need carotid duplex, popliteal duplex, and ABI's prior to AAA repair. Our office can perform all of the above testing in our non invasive vascular lab.  Dr. Trula Slade is waiting to hear from the manufacturer of the stent graft as to whether Kevin. Bonenberger would be able to have fenestrations custom made, if I understand correctly.  Dr. Trula Slade will then contact Kevin. [employer]. I discussed the above with Kevin. Serio.

## 2016-01-22 NOTE — Telephone Encounter (Signed)
-----   Message from Mena Goes, RN sent at 01/22/2016 12:22 PM EDT ----- Regarding: Patient phone call Kevin Frank called to triage but he wants to speak with you regarding things you discussed at last visit.   Thanks

## 2016-01-28 DIAGNOSIS — G4733 Obstructive sleep apnea (adult) (pediatric): Secondary | ICD-10-CM | POA: Diagnosis not present

## 2016-02-08 ENCOUNTER — Ambulatory Visit (INDEPENDENT_AMBULATORY_CARE_PROVIDER_SITE_OTHER): Payer: Commercial Managed Care - HMO | Admitting: Nurse Practitioner

## 2016-02-08 ENCOUNTER — Encounter: Payer: Self-pay | Admitting: Nurse Practitioner

## 2016-02-08 VITALS — BP 132/78 | HR 56 | Ht 65.5 in | Wt 233.0 lb

## 2016-02-08 DIAGNOSIS — R911 Solitary pulmonary nodule: Secondary | ICD-10-CM

## 2016-02-08 DIAGNOSIS — Z720 Tobacco use: Secondary | ICD-10-CM

## 2016-02-08 DIAGNOSIS — I10 Essential (primary) hypertension: Secondary | ICD-10-CM | POA: Diagnosis not present

## 2016-02-08 DIAGNOSIS — E785 Hyperlipidemia, unspecified: Secondary | ICD-10-CM | POA: Diagnosis not present

## 2016-02-08 DIAGNOSIS — F17200 Nicotine dependence, unspecified, uncomplicated: Secondary | ICD-10-CM

## 2016-02-08 DIAGNOSIS — I714 Abdominal aortic aneurysm, without rupture, unspecified: Secondary | ICD-10-CM

## 2016-02-08 DIAGNOSIS — Z01818 Encounter for other preprocedural examination: Secondary | ICD-10-CM

## 2016-02-08 LAB — BASIC METABOLIC PANEL
BUN: 14 mg/dL (ref 7–25)
CO2: 23 mmol/L (ref 20–31)
Calcium: 9.6 mg/dL (ref 8.6–10.3)
Chloride: 106 mmol/L (ref 98–110)
Creat: 0.98 mg/dL (ref 0.70–1.18)
Glucose, Bld: 98 mg/dL (ref 65–99)
Potassium: 4.1 mmol/L (ref 3.5–5.3)
Sodium: 140 mmol/L (ref 135–146)

## 2016-02-08 NOTE — Patient Instructions (Signed)
We will be checking the following labs today - BMET   Medication Instructions:    Continue with your current medicines.     Testing/Procedures To Be Arranged:  CT chest without contrast to follow up lung nodule noted on Xray  Lexiscan Myoview  Follow-Up:   See me in 6 months unless your stress test is abnormal    Other Special Instructions:   N/A    If you need a refill on your cardiac medications before your next appointment, please call your pharmacy.   Call the [place] office at [phone] if you have any questions, problems or concerns.

## 2016-02-08 NOTE — Progress Notes (Signed)
CARDIOLOGY OFFICE NOTE  Date:  02/08/2016    Kevin Frank Date of Birth: [birth year] Medical Record #[id number]  PCP:  Alonza Bogus, MD  Cardiologist:  Jerel Shepherd  Chief Complaint  Patient presents with  . PAD  . Coronary Artery Disease  . Hyperlipidemia  . Hypertension    Pre op visit - seen for Dr. [place]urt Knack    History of Present Illness: Kevin Frank is a 75 y.o. male who presents today for a follow up visit/pre op exam. This is an approximate one year check. Seen for Dr. Burt Knack. He is a former patient of Dr. Susa Simmonds.   He has a history of hypertension, hyperlipidemia, DM, tobacco abuse, COPD, and abdominal aortic aneurysm. The patient's aneurysm surveillance is performed by [facility].  At his recent assessment his AAA measured 4.9 cm - this was mildly increased from prior study. He is scheduled for another study in 6 months. He continues to smoke cigarettes. He has not had any known CAD.   Last seen back in September. Cardiac status was stable. Does have shortness of breath - continues to smoke - not interested in stopping.   Noted that he saw Dr. Trula Slade about 3 weeks ago due to his AAA - measuring just over 5 cm by his measurement. To be considered for "fenestrated repair". Now needing pre op clearance. Recent CT also noted a lung nodule which needs further clarification.    Comes in today. Here alone. Notes he is doing ok. Does not want his AAA fixed until after 9/16 - he has a party he is hosting - sounds pretty big. 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 No chest pain. Remains short of breath. Still smokes. Has a cough - this is worse - notes worse over the past 2 to 3 months after cleaning mold out of his basement with pure Clorox and poor ventilation. His labs are checked by PCP. He remains pretty active despite his COPD. No real exercise program. Not dizzy or lightheaded. Says his sugars are ok.   Past Medical History:  Diagnosis Date  . AAA (abdominal  aortic aneurysm) ([place]) 01/2008   3.2cm  . BPH (benign prostatic hyperplasia)   . COPD (chronic obstructive pulmonary disease) ([place])   . Diabetes mellitus    type II  . Gout   . Hypercholesterolemia   . Hyperlipidemia   . Hypertension   . Normal nuclear stress test 2011  . Obesity   . Obstructive sleep apnea on CPAP   . Presence of indwelling urinary catheter   . Shortness of breath    due to cigarette abuse  . Tobacco abuse     Past Surgical History:  Procedure Laterality Date  . CHOLECYSTECTOMY  1997   [place]  . CHOLECYSTECTOMY  1989  . TRANSURETHRAL RESECTION OF PROSTATE  01/07/2011   Procedure: TRANSURETHRAL RESECTION OF THE PROSTATE (TURP);  Surgeon: Marissa Nestle;  Location: [facility];  Service: Urology;  Laterality: N/A;     Medications: Current Outpatient Prescriptions  Medication Sig Dispense Refill  . albuterol (PROVENTIL HFA;VENTOLIN HFA) 108 (90 BASE) MCG/ACT inhaler Inhale 2 puffs into the lungs every 6 (six) hours as needed.    [name] allopurinol (ZYLOPRIM) 300 MG tablet Take 300 mg by mouth daily.      [name] amLODipine (NORVASC) 10 MG tablet Take 1 tablet (10 mg total) by mouth daily. 90 tablet 2  . atorvastatin (LIPITOR) 20 MG tablet     .  azelastine (ASTELIN) 0.1 % nasal spray Use 1-2 sprays in each nostril as directed each morning and evening 90 mL 1  . budesonide-formoterol (SYMBICORT) 160-4.5 MCG/ACT inhaler Inhale 2 puffs into the lungs 2 (two) times daily. 3 Inhaler 1  . furosemide (LASIX) 40 MG tablet Take 40 mg by mouth daily.      [name] losartan (COZAAR) 50 MG tablet Take 1 tablet (50 mg total) by mouth daily. 90 tablet 3  . metFORMIN (GLUCOPHAGE) 500 MG tablet Take 500 mg by mouth 2 (two) times daily with a meal.      . metoprolol succinate (TOPROL-XL) 25 MG 24 hr tablet Take 1 tablet (25 mg total) by mouth daily. 90 tablet 2  . Olopatadine HCl 0.6 % SOLN USE 1-2 SPRAYS IN EACH NOSTRIL EACH MORNING AND EVENING. 1 Bottle 5  . sertraline (ZOLOFT) 50 MG tablet Take 50  mg by mouth daily.      [name] SPIRIVA HANDIHALER 18 MCG inhalation capsule Place 18 mcg into inhaler and inhale as needed. For congestion/breathing     Current Facility-Administered Medications  Medication Dose Route Frequency Provider Last Rate Last Dose  . ipratropium (ATROVENT) nebulizer solution 0.5 mg  0.5 mg Nebulization Once Roselyn Malachy Moan, MD      . levalbuterol Penne Lash) nebulizer solution 1.25 mg  1.25 mg Nebulization Once Roselyn Malachy Moan, MD        Allergies: No Known Allergies  Social History: The patient  reports that he has been smoking Cigarettes.  He has a 100.00 pack-year smoking history. He has never used smokeless tobacco. He reports that he drinks alcohol. He reports that he does not use drugs.   Family History: The patient's family history includes Cancer in his mother; Diabetes in his mother; Hypertension in his mother; Liver disease in his father; Lymphoma in his mother.   Review of Systems: Please see the history of present illness.   Otherwise, the review of systems is positive for none.   All other systems are reviewed and negative.   Physical Exam: VS:  BP 132/78 (BP Location: Left Arm, Patient Position: Sitting, Cuff Size: Large)   Pulse (!) 56   Ht 5' 5.5" (1.664 m)   Wt 233 lb (105.7 kg)   BMI 38.18 kg/m  .  BMI Body mass index is 38.18 kg/m.  Wt Readings from Last 3 Encounters:  02/08/16 233 lb (105.7 kg)  01/21/16 224 lb (101.6 kg)  12/20/15 227 lb 12.8 oz (103.3 kg)    General: Pleasant. Elderly male. Face pretty ruddy. He is alert and in no acute distress.   HEENT: Normal.  Neck: Supple, no JVD, carotid bruits, or masses noted.  Cardiac: Regular rate and rhythm. Heart tones are distant. No edema.  Respiratory:  Lungs are coarse bilaterally with normal work of breathing.  GI: Soft and nontender.  MS: No deformity or atrophy. Gait and ROM intact.  Skin: Warm and dry. Color is normal.  Neuro:  Strength and sensation are intact and no gross focal  deficits noted.  Psych: Alert, appropriate and with normal affect.   LABORATORY DATA:  EKG:  EKG is ordered today. This shows sinus bradycardia.   Lab Results  Component Value Date   WBC 14.5 (H) 01/08/2011   HGB 14.1 01/08/2011   HCT 41.0 01/08/2011   PLT 168 01/08/2011   GLUCOSE 133 (H) 01/08/2011   CHOL 138 12/16/2010   TRIG 53.0 12/16/2010   HDL 46.10 12/16/2010   LDLCALC 81 12/16/2010  ALT 18 12/16/2010   AST 16 12/16/2010   NA 137 01/08/2011   K 3.7 01/08/2011   CL 103 01/08/2011   CREATININE 0.64 01/08/2011   BUN 10 01/08/2011   CO2 25 01/08/2011    BNP (last 3 results) No results for input(s): BNP in the last 8760 hours.  ProBNP (last 3 results) No results for input(s): PROBNP in the last 8760 hours.   Other Studies Reviewed Today:  CT ANGIO ABD/PELVIS IMPRESSION: 1. 4.9 x 4.8 cm infrarenal abdominal aortic aneurysm. There are ulcerated plaques associated with the aneurysm but no dissection or evidence of leak. 2. 5.8 mm nodule, incompletely evaluated, in the right lung base. A CT of the chest could better evaluate. These results will be called to the ordering clinician or representative by the Radiologist Assistant, and communication documented in the PACS or zVision Dashboard.   Electronically Signed   By: Dorise Bullion III M.D   On: 01/21/2016 14:10   Assessment/Plan: 1. Pre op clearance for AAA - will arrange for Lexiscan given his multiple CV risk factors. Further disposition to follow.   2. Lung nodule - needs further evaluation - will arrange for chest CT  3. Cough - worsening after Clorox exposure - arranging for chest CT  4. HTN - BP ok on current regimen for now  5. HLD - on statin - labs checked by PCP  6. Long standing tobacco - not interested in stopping.   Current medicines are reviewed with the patient today.  The patient does not have concerns regarding medicines other than what has been noted above.  The following  changes have been made:  See above.  Labs/ tests ordered today include:    Orders Placed This Encounter  Procedures  . CT Chest Wo Contrast  . Basic metabolic panel  . Myocardial Perfusion Imaging     Disposition:   FU with me in 6 months unless his Myoview is abnormal.    Patient is agreeable to this plan and will call if any problems develop in the interim.   Signed: Burtis Junes, RN, ANP-C 02/08/2016 12:48 PM  [facility] [address] [place], [place]  [address] Phone: [phone] Fax: [phone]

## 2016-02-11 ENCOUNTER — Telehealth: Payer: Self-pay | Admitting: *Deleted

## 2016-02-11 DIAGNOSIS — I10 Essential (primary) hypertension: Secondary | ICD-10-CM

## 2016-02-11 NOTE — Addendum Note (Signed)
Addended by: Michae Kava on: 02/11/2016 10:15 AM   Modules accepted: Orders

## 2016-02-11 NOTE — Telephone Encounter (Signed)
I wasn't here that day. Thanks. Andee Poles

## 2016-02-13 ENCOUNTER — Encounter: Payer: Self-pay | Admitting: Internal Medicine

## 2016-02-13 ENCOUNTER — Ambulatory Visit (INDEPENDENT_AMBULATORY_CARE_PROVIDER_SITE_OTHER): Payer: Commercial Managed Care - HMO | Admitting: Internal Medicine

## 2016-02-13 VITALS — BP 136/80 | HR 53 | Ht 65.5 in | Wt 231.0 lb

## 2016-02-13 DIAGNOSIS — R05 Cough: Secondary | ICD-10-CM | POA: Diagnosis not present

## 2016-02-13 DIAGNOSIS — Z7729 Contact with and (suspected ) exposure to other hazardous substances: Secondary | ICD-10-CM

## 2016-02-13 DIAGNOSIS — R0600 Dyspnea, unspecified: Secondary | ICD-10-CM

## 2016-02-13 DIAGNOSIS — Z77098 Contact with and (suspected) exposure to other hazardous, chiefly nonmedicinal, chemicals: Secondary | ICD-10-CM

## 2016-02-13 DIAGNOSIS — R0689 Other abnormalities of breathing: Secondary | ICD-10-CM

## 2016-02-13 DIAGNOSIS — F17210 Nicotine dependence, cigarettes, uncomplicated: Secondary | ICD-10-CM | POA: Diagnosis not present

## 2016-02-13 DIAGNOSIS — R911 Solitary pulmonary nodule: Secondary | ICD-10-CM

## 2016-02-13 DIAGNOSIS — R059 Cough, unspecified: Secondary | ICD-10-CM

## 2016-02-13 MED ORDER — PREDNISONE 10 MG PO TABS: ORAL_TABLET | ORAL | 0 refills | Status: DC

## 2016-02-13 NOTE — Progress Notes (Signed)
Subjective:    Patient ID: Kevin Frank, male    DOB: 1941-03-08, 75 y.o.   MRN: [id number]  PCP Alonza Bogus, MD Vascular surgeon Dr. Trula Slade Allergist for headaches Cardiologist Alan Mulder  HPI  IOV 02/13/2016  Chief Complaint  Patient presents with  . Pulmonary Consult    Pt referred by Dr. Ishmael Holter for COPD. Pt c/o prod cough with white to yellow mucus. Pt denies SOB and CP/tightness.     75 year old male who presents with his wife. 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 Then approximately early July 2017 he was spray painting the basement with bleach in a closed door environment without any windows.  Since then he's had an abrupt worsening in cough and shortness of breath.. He underplays his symptoms while his wife narrates the severity of symptoms to be a lot more. He denies any wheezing. He did apparently see Dr. Luan Pulling at [facility] pulmonology. He was given antibiotic and some sort of steroids but this has not helped. Wife is wondering about prednisone. COPD cat score is 20 assuming he has COPD    He was having workup for abdominal aortic aneurysm and needs a stent. He was seen by cardiology. There is CT abdomen which in the lung cut showed a incomplete 5.8 mm right lower lobe nodule which is a new finding. This is a 01/21/2016. He now has a [employer] and a CT chest pending 02/20/2016.  Of note strong smoking history: Refuses to quit   Spirometry 10/02/2015: Personally visualized the trace is normal Blood work 02/08/2016: Normal creatinine 0.98 mg percent. Normal hemoglobin 14.1 g percent. CT abdomen lung cut 01/21/2016: 5.8 mm partially visualized nodule in the right lower lobe  Walking desaturation test 185 feet 3 laps on room air 8/.23/17:  Did not desaturate below 93%. Completed all 3 laps but he did started resting pulse ox of 98%.    CAT COPD Symptom & Quality of Life Score (GSK trademark) 0 is no burden. 5 is highest burden 02/13/2016   Never Cough -> Cough all the time 5  No phlegm in chest -> Chest is full of phlegm 3  No chest tightness -> Chest feels very tight 2  No dyspnea for 1 flight stairs/hill -> Very dyspneic for 1 flight of stairs 3  No limitations for ADL at home -> Very limited with ADL at home 4  Confident leaving home -> Not at all confident leaving home 0  Sleep soundly -> Do not sleep soundly because of lung condition 0  Lots of Energy -> No energy at all 3  TOTAL Score (max 40)  20        has a past medical history of AAA (abdominal aortic aneurysm) ([place]) (01/2008); BPH (benign prostatic hyperplasia); COPD (chronic obstructive pulmonary disease) ([place]); Diabetes mellitus; Gout; Hypercholesterolemia; Hyperlipidemia; Hypertension; Normal nuclear stress test (2011); Obesity; Obstructive sleep apnea on CPAP; Presence of indwelling urinary catheter; Shortness of breath; and Tobacco abuse.   reports that he has been smoking Cigarettes.  He has a 100.00 pack-year smoking history. He has never used smokeless tobacco.  Past Surgical History:  Procedure Laterality Date  . CHOLECYSTECTOMY  1997   [place]  . TRANSURETHRAL RESECTION OF PROSTATE  01/07/2011   Procedure: TRANSURETHRAL RESECTION OF  THE PROSTATE (TURP);  Surgeon: Marissa Nestle;  Location: [facility];  Service: Urology;  Laterality: N/A;    No Known Allergies  Immunization History  Administered Date(s) Administered  . Pneumococcal Polysaccharide-23 01/08/2011    Family History  Problem Relation Age of Onset  . Liver disease Father   . Lymphoma Mother   . Diabetes Mother   . Hypertension Mother   . Cancer Mother   . Mental retardation      sibling -- from birth      Current Outpatient Prescriptions:  .  albuterol (PROVENTIL HFA;VENTOLIN  HFA) 108 (90 BASE) MCG/ACT inhaler, Inhale 2 puffs into the lungs every 6 (six) hours as needed., Disp: , Rfl:  .  allopurinol (ZYLOPRIM) 300 MG tablet, Take 300 mg by mouth daily.  , Disp: , Rfl:  .  amLODipine (NORVASC) 10 MG tablet, Take 1 tablet (10 mg total) by mouth daily., Disp: 90 tablet, Rfl: 2 .  atorvastatin (LIPITOR) 20 MG tablet, , Disp: , Rfl:  .  azelastine (ASTELIN) 0.1 % nasal spray, Use 1-2 sprays in each nostril as directed each morning and evening, Disp: 90 mL, Rfl: 1 .  budesonide-formoterol (SYMBICORT) 160-4.5 MCG/ACT inhaler, Inhale 2 puffs into the lungs 2 (two) times daily., Disp: 3 Inhaler, Rfl: 1 .  furosemide (LASIX) 40 MG tablet, Take 40 mg by mouth daily.  , Disp: , Rfl:  .  losartan (COZAAR) 50 MG tablet, Take 1 tablet (50 mg total) by mouth daily., Disp: 90 tablet, Rfl: 3 .  metFORMIN (GLUCOPHAGE) 500 MG tablet, Take 500 mg by mouth 2 (two) times daily with a meal.  , Disp: , Rfl:  .  metoprolol succinate (TOPROL-XL) 25 MG 24 hr tablet, Take 1 tablet (25 mg total) by mouth daily., Disp: 90 tablet, Rfl: 2 .  sertraline (ZOLOFT) 50 MG tablet, Take 50 mg by mouth daily.  , Disp: , Rfl:  .  SPIRIVA HANDIHALER 18 MCG inhalation capsule, Place 18 mcg into inhaler and inhale as needed. For congestion/breathing, Disp: , Rfl:   Current Facility-Administered Medications:  .  ipratropium (ATROVENT) nebulizer solution 0.5 mg, 0.5 mg, Nebulization, Once, Roselyn Malachy Moan, MD .  levalbuterol [facility]) nebulizer solution 1.25 mg, 1.25 mg, Nebulization, Once, Roselyn Malachy Moan, MD    Review of Systems  Constitutional: Negative for fever and unexpected weight change.  HENT: Negative for congestion, dental problem, ear pain, nosebleeds, postnasal drip, rhinorrhea, sinus pressure, sneezing, sore throat and trouble swallowing.   Eyes: Negative for redness and itching.  Respiratory: Positive for cough. Negative for chest tightness, shortness of breath and wheezing.   Cardiovascular:  Positive for leg swelling. Negative for palpitations.  Gastrointestinal: Negative for nausea and vomiting.  Genitourinary: Negative for dysuria.  Musculoskeletal: Negative for joint swelling.  Skin: Negative for rash.  Neurological: Negative for headaches.  Hematological: Does not bruise/bleed easily.  Psychiatric/Behavioral: Negative for dysphoric mood. The patient is not nervous/anxious.        Objective:   Physical Exam  Constitutional: He is oriented to person, place, and time. He appears well-developed and well-nourished. No distress.  HENT:  Head: Normocephalic and atraumatic.  Right Ear: External ear normal.  Left Ear: External ear normal.  Mouth/Throat: Oropharynx is clear and moist. No oropharyngeal exudate.  Has secured packet in his left pocket  Eyes: Conjunctivae and EOM are normal. Pupils are equal, round, and reactive to light. Right eye exhibits no discharge. Left eye exhibits no discharge. No scleral icterus.  Neck:  Normal range of motion. Neck supple. No JVD present. No tracheal deviation present. No thyromegaly present.  Cardiovascular: Normal rate, regular rhythm and intact distal pulses.  Exam reveals no gallop and no friction rub.   No murmur heard. Pulmonary/Chest: Effort normal and breath sounds normal. No respiratory distress. He has no wheezes. He has no rales. He exhibits no tenderness.  Abdominal: Soft. Bowel sounds are normal. He exhibits no distension and no mass. There is no tenderness. There is no rebound and no guarding.  Visceral obesity present  Musculoskeletal: Normal range of motion. He exhibits no edema or tenderness.  Lymphadenopathy:    He has no cervical adenopathy.  Neurological: He is alert and oriented to person, place, and time. He has normal reflexes. No cranial nerve deficit. Coordination normal.  Skin: Skin is warm and dry. No rash noted. He is not diaphoretic. No erythema. No pallor.  Psychiatric: He has a normal mood and affect. His  behavior is normal. Judgment and thought content normal.  Nursing note and vitals reviewed.   Vitals:   02/13/16 0944  BP: 136/80  Pulse: (!) 53  SpO2: 95%  Weight: 231 lb (104.8 kg)  Height: 5' 5.5" (1.664 m)    Estimated body mass index is 37.86 kg/m as calculated from the following:   Height as of this encounter: 5' 5.5" (1.664 m).   Weight as of this encounter: 231 lb (104.8 kg).        Assessment & Plan:     ICD-9-CM ICD-10-CM   1. Smoking greater than 40 pack years 305.1 F17.210   2. Exposure to chemical inhalation V87.39 Z77.29   3. Cough 786.2 R05   4. Dyspnea and respiratory abnormality 786.09 R06.00     R06.89   5. Incidental lung nodule, > 9m and < 833m793.11 R91.1     If it appears his chronic shortness of breath and cough and with a strong smoking history this is been attributed to a COPD not otherwise specified. He's been on triple inhaler therapy which is Ongoing. Then in early July 2017 here each exposure and after that he's had symptom deterioration to CAT Score of 20. We will try to address the symptom deterioration by a day prednisone taper. I initially prescribed 5 days but his wife was asking for IM Medrol shot. After discussion we agreed that he will have a day taper. We discussed the role of antibiotics and the situation and we agreed that we would treat with her antibiotics  After this we need to establish the nature of his baseline symptoms. Therefore to this and we will await the results of the CT chest and cardiac stress test 02/20/2016 but in addition also order a pulmonary function test which can be done at  in [place]'s home.  He will then come for follow-up.  Of note he has a new right lower lobe lung nodule and a CT chest will help delineate that   Dr. MuBrand MalesM.D., F.[facility].P Pulmonary and Critical Care Medicine Staff Physician CoFayettevilleulmonary and Critical Care Pager: [phone] no answer or between   15:00h - 7:00h: call [phone]  02/13/2016 10:27 AM  And didn't have leg. On

## 2016-02-13 NOTE — Patient Instructions (Addendum)
Smoking greater than 40 pack years Exposure to chemical inhalation Cough Dyspnea and respiratory abnormality   Incidental lung nodule, > 1m and < 842m PLAN - Take prednisone 40 mg daily x 2 days, then '20mg'$  daily x 2 days, then '10mg'$  daily x 2 days, then '5mg'$  daily x 2 days and stop - Await results of cardiac stress test and CT scan of the chest 02/20/2016 [previously scheduled by cardiology] - Do full pulmonary function test hopefully in the next week or 2 - we will order this to be done at any location including [place]  Follow-up -Return to see either myself or my nurse practitioner's after completion of form and function test cardiac stress test and CT scan of the chest

## 2016-02-18 ENCOUNTER — Telehealth (HOSPITAL_COMMUNITY): Payer: Self-pay | Admitting: *Deleted

## 2016-02-18 NOTE — Telephone Encounter (Signed)
Left message on voicemail per DPR in reference to upcoming appointment scheduled on 02/20/16 with detailed instructions given per Myocardial Perfusion Study Information Sheet for the test. LM to arrive 15 minutes early, and that it is imperative to arrive on time for appointment to keep from having the test rescheduled. If you need to cancel or reschedule your appointment, please call the office within 24 hours of your appointment. Failure to do so may result in a cancellation of your appointment, and a $50 no show fee. Phone number given for call back for any questions. Hubbard Robinson, RN

## 2016-02-18 NOTE — Telephone Encounter (Signed)
error 

## 2016-02-20 ENCOUNTER — Ambulatory Visit (INDEPENDENT_AMBULATORY_CARE_PROVIDER_SITE_OTHER)
Admission: RE | Admit: 2016-02-20 | Discharge: 2016-02-20 | Disposition: A | Discharge status: Home / Self Care | Payer: Commercial Managed Care - HMO | Source: Ambulatory Visit | Attending: Nurse Practitioner | Admitting: Nurse Practitioner

## 2016-02-20 ENCOUNTER — Ambulatory Visit (HOSPITAL_COMMUNITY): Payer: Commercial Managed Care - HMO | Attending: Cardiovascular Disease

## 2016-02-20 DIAGNOSIS — R0602 Shortness of breath: Secondary | ICD-10-CM | POA: Insufficient documentation

## 2016-02-20 DIAGNOSIS — J449 Chronic obstructive pulmonary disease, unspecified: Secondary | ICD-10-CM | POA: Diagnosis not present

## 2016-02-20 DIAGNOSIS — Z01818 Encounter for other preprocedural examination: Secondary | ICD-10-CM

## 2016-02-20 DIAGNOSIS — I2510 Atherosclerotic heart disease of native coronary artery without angina pectoris: Secondary | ICD-10-CM | POA: Diagnosis not present

## 2016-02-20 DIAGNOSIS — I10 Essential (primary) hypertension: Secondary | ICD-10-CM | POA: Diagnosis not present

## 2016-02-20 DIAGNOSIS — R911 Solitary pulmonary nodule: Secondary | ICD-10-CM | POA: Diagnosis not present

## 2016-02-20 DIAGNOSIS — I714 Abdominal aortic aneurysm, without rupture: Secondary | ICD-10-CM | POA: Diagnosis not present

## 2016-02-20 DIAGNOSIS — J432 Centrilobular emphysema: Secondary | ICD-10-CM | POA: Diagnosis not present

## 2016-02-20 DIAGNOSIS — E119 Type 2 diabetes mellitus without complications: Secondary | ICD-10-CM | POA: Diagnosis not present

## 2016-02-20 LAB — MYOCARDIAL PERFUSION IMAGING
LV dias vol: 138 mL (ref 62–150)
LV sys vol: 58 mL
Peak HR: 75 {beats}/min
RATE: 0.32
Rest HR: 49 {beats}/min
SDS: 2
SRS: 0
SSS: 2
TID: 1.16

## 2016-02-20 MED ORDER — TECHNETIUM TC 99M TETROFOSMIN IV KIT
31.4000 | PACK | Freq: Once | INTRAVENOUS | Status: AC | PRN
  Administered 2016-02-20: 31 via INTRAVENOUS
  Filled 2016-02-20: qty 31

## 2016-02-20 MED ORDER — REGADENOSON 0.4 MG/5ML IV SOLN
0.4000 mg | Freq: Once | INTRAVENOUS | Status: AC
  Administered 2016-02-20: 0.4 mg via INTRAVENOUS

## 2016-02-20 MED ORDER — TECHNETIUM TC 99M TETROFOSMIN IV KIT
10.3000 | PACK | Freq: Once | INTRAVENOUS | Status: AC | PRN
  Administered 2016-02-20: 10 via INTRAVENOUS
  Filled 2016-02-20: qty 10

## 2016-02-22 ENCOUNTER — Other Ambulatory Visit: Payer: Self-pay | Admitting: *Deleted

## 2016-02-22 DIAGNOSIS — I359 Nonrheumatic aortic valve disorder, unspecified: Secondary | ICD-10-CM

## 2016-02-28 DIAGNOSIS — G4733 Obstructive sleep apnea (adult) (pediatric): Secondary | ICD-10-CM | POA: Diagnosis not present

## 2016-03-05 ENCOUNTER — Encounter: Payer: Self-pay | Admitting: Internal Medicine

## 2016-03-05 ENCOUNTER — Telehealth: Payer: Self-pay | Admitting: Internal Medicine

## 2016-03-05 ENCOUNTER — Ambulatory Visit (INDEPENDENT_AMBULATORY_CARE_PROVIDER_SITE_OTHER): Payer: Commercial Managed Care - HMO | Admitting: Internal Medicine

## 2016-03-05 VITALS — BP 140/72 | HR 57 | Ht 70.0 in | Wt 232.0 lb

## 2016-03-05 DIAGNOSIS — Z23 Encounter for immunization: Secondary | ICD-10-CM

## 2016-03-05 DIAGNOSIS — F17210 Nicotine dependence, cigarettes, uncomplicated: Secondary | ICD-10-CM | POA: Diagnosis not present

## 2016-03-05 DIAGNOSIS — R911 Solitary pulmonary nodule: Secondary | ICD-10-CM | POA: Diagnosis not present

## 2016-03-05 DIAGNOSIS — J439 Emphysema, unspecified: Secondary | ICD-10-CM

## 2016-03-05 DIAGNOSIS — J449 Chronic obstructive pulmonary disease, unspecified: Secondary | ICD-10-CM | POA: Insufficient documentation

## 2016-03-05 DIAGNOSIS — F17200 Nicotine dependence, unspecified, uncomplicated: Secondary | ICD-10-CM | POA: Insufficient documentation

## 2016-03-05 NOTE — Telephone Encounter (Signed)
Will sign encounter

## 2016-03-05 NOTE — Progress Notes (Signed)
Subjective:     Patient ID: Kevin Frank, male   DOB: 05-08-1941, 75 y.o.   MRN: [id number]  HPI     PCP Alonza Bogus, MD Vascular surgeon Dr. Trula Slade Allergist for headaches Cardiologist Alan Mulder  HPI  IOV 02/13/2016  Chief Complaint  Patient presents with  . Pulmonary Consult    Pt referred by Dr. Ishmael Holter for COPD. Pt c/o prod cough with white to yellow mucus. Pt denies SOB and CP/tightness.     75 year old male who presents with his wife. 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 Then approximately early July 2017 he was spray painting the basement with bleach in a closed door environment without any windows.  Since then he's had an abrupt worsening in cough and shortness of breath.. He underplays his symptoms while his wife narrates the severity of symptoms to be a lot more. He denies any wheezing. He did apparently see Dr. Luan Pulling at [facility] pulmonology. He was given antibiotic and some sort of steroids but this has not helped. Wife is wondering about prednisone. COPD cat score is 20 assuming he has COPD    He was having workup for abdominal aortic aneurysm and needs a stent. He was seen by cardiology. There is CT abdomen which in the lung cut showed a incomplete 5.8 mm right lower lobe nodule which is a new finding. This is a 01/21/2016. He now has a [employer] and a CT chest pending 02/20/2016.  Of note strong smoking history: Refuses to quit   Spirometry 10/02/2015: Personally visualized the trace is normal Blood work 02/08/2016: Normal creatinine 0.98 mg percent. Normal hemoglobin 14.1 g percent. CT abdomen lung cut 01/21/2016: 5.8 mm partially visualized nodule in the right lower lobe  Walking desaturation test 185 feet 3 laps on room air  8/.23/17: Did not desaturate below 93%. Completed all 3 laps but he did started resting pulse ox of 98%.   OV 03/05/2016  Chief Complaint  Patient presents with  . Follow-up    Pt had CT Chest and Stress test 02/20/16. He states that his cough seems to be some better. His breathing is unchanged. No new co's today.    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 He has normal spirometry. He is here after cardiac stress test and CT scan evaluation which was done for lung nodules. CT scan does not show lung cancer. It is documented below. He only is stable 5 mm nodule. He is below 19 years of age. cardiac stress test 02/20/2016: normal per cards report. He was also supposed to have PFT but he forgot to have this. He is stable on triple inhaler therapy. 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 He continues to smoke.    IMPRESSION: 1. Multiple 1 tiny pulmonary nodules in the lungs, largest of which has a mean diameter of only 5 mm, as above. No follow-up needed if patient is low-risk (and has no known or suspected primary neoplasm). Non-contrast chest CT can be considered in 12 months if patient is high-risk. This recommendation follows the consensus statement: Guidelines for Management of Incidental Pulmonary Nodules Detected on CT Images:From the Fleischner Society 2017; published online before print (10.1148/radiol.1884166063). 2. Aortic atherosclerosis, in addition to left main and 3 vessel  coronary artery disease. Assessment for potential risk factor modification, dietary therapy or pharmacologic therapy may be warranted, if clinically indicated. 3. There are calcifications of the aortic valve. Echocardiographic correlation for evaluation of potential valvular dysfunction may be warranted if clinically indicated. 4. Large infrarenal abdominal aortic aneurysm measuring at least 5.1 x 4.7 cm incompletely visualized, but appears grossly similar to  the recent prior examination. Recommend followup by abdomen and pelvis CTA in 6 months, and vascular surgery referral/consultation if not already obtained. This recommendation follows ACR consensus guidelines: White Paper of the ACR Incidental Findings Committee II on Vascular Findings. J Am Coll Radiol 2013; 10:789-794. 5. Mild diffuse bronchial thickening with mild centrilobular and paraseptal emphysema; imaging findings suggestive of underlying COPD.   Electronically Signed   By: Vinnie Langton M.D.   On: 02/20/2016 09:43       CAT COPD Symptom & Quality of Life Score ([place]) 0 is no burden. 5 is highest burden 02/13/2016   Never Cough -> Cough all the time 5  No phlegm in chest -> Chest is full of phlegm 3  No chest tightness -> Chest feels very tight 2  No dyspnea for 1 flight stairs/hill -> Very dyspneic for 1 flight of stairs 3  No limitations for ADL at home -> Very limited with ADL at home 4  Confident leaving home -> Not at all confident leaving home 0  Sleep soundly -> Do not sleep soundly because of lung condition 0  Lots of Energy -> No energy at all 3  TOTAL Score (max 40)  20       has a past medical history of AAA (abdominal aortic aneurysm) ([place]) (01/2008); BPH (benign prostatic hyperplasia); COPD (chronic obstructive pulmonary disease) ([place]); Diabetes mellitus; Gout; Hypercholesterolemia; Hyperlipidemia; Hypertension; Normal nuclear stress test (2011); Obesity; Obstructive sleep apnea on CPAP; Presence of indwelling urinary catheter; Shortness of breath; and Tobacco abuse.   reports that he has been smoking Cigarettes.  He has a 100.00 pack-year smoking history. He has never used smokeless tobacco.  Past Surgical History:  Procedure Laterality Date  . CHOLECYSTECTOMY  1997   [place]  . TRANSURETHRAL RESECTION OF PROSTATE  01/07/2011   Procedure: TRANSURETHRAL RESECTION OF THE PROSTATE (TURP);  Surgeon: Marissa Nestle;  Location: [facility];   Service: Urology;  Laterality: N/A;    No Known Allergies  Immunization History  Administered Date(s) Administered  . Pneumococcal Polysaccharide-23 01/08/2011    Family History  Problem Relation Age of Onset  . Liver disease Father   . Lymphoma Mother   . Diabetes Mother   . Hypertension Mother   . Cancer Mother   . Mental retardation      sibling -- from birth      Current Outpatient Prescriptions:  .  albuterol (PROVENTIL HFA;VENTOLIN HFA) 108 (90 BASE) MCG/ACT inhaler, Inhale 2 puffs into the lungs every 6 (six) hours as needed., Disp: , Rfl:  .  allopurinol (ZYLOPRIM) 300 MG tablet, Take 300 mg by mouth daily.  , Disp: , Rfl:  .  amLODipine (NORVASC) 10 MG tablet, Take 1 tablet (10 mg total) by mouth daily., Disp: 90 tablet, Rfl: 2 .  atorvastatin (LIPITOR) 20 MG tablet, , Disp: , Rfl:  .  azelastine (ASTELIN) 0.1 % nasal spray, Use 1-2 sprays in each nostril as directed each morning and evening, Disp: 90 mL, Rfl: 1 .  budesonide-formoterol (SYMBICORT) 160-4.5 MCG/ACT inhaler, Inhale 2 puffs into the lungs 2 (two) times daily., Disp: 3  Inhaler, Rfl: 1 .  furosemide (LASIX) 40 MG tablet, Take 40 mg by mouth daily.  , Disp: , Rfl:  .  losartan (COZAAR) 50 MG tablet, Take 1 tablet (50 mg total) by mouth daily., Disp: 90 tablet, Rfl: 3 .  metFORMIN (GLUCOPHAGE) 500 MG tablet, Take 500 mg by mouth 2 (two) times daily with a meal.  , Disp: , Rfl:  .  metoprolol succinate (TOPROL-XL) 25 MG 24 hr tablet, Take 1 tablet (25 mg total) by mouth daily., Disp: 90 tablet, Rfl: 2 .  sertraline (ZOLOFT) 50 MG tablet, Take 50 mg by mouth daily.  , Disp: , Rfl:  .  SPIRIVA HANDIHALER 18 MCG inhalation capsule, Place 18 mcg into inhaler and inhale as needed. For congestion/breathing, Disp: , Rfl:   Current Facility-Administered Medications:  .  ipratropium (ATROVENT) nebulizer solution 0.5 mg, 0.5 mg, Nebulization, Once, Roselyn Malachy Moan, MD .  levalbuterol [facility]) nebulizer solution 1.25 mg,  1.25 mg, Nebulization, Once, Roselyn Malachy Moan, MD   Review of Systems     Objective:   Physical Exam  Constitutional: He is oriented to person, place, and time. He appears well-developed and well-nourished. No distress.  HENT:  Head: Normocephalic and atraumatic.  Right Ear: External ear normal.  Left Ear: External ear normal.  Mouth/Throat: Oropharynx is clear and moist. No oropharyngeal exudate.  Eyes: Conjunctivae and EOM are normal. Pupils are equal, round, and reactive to light. Right eye exhibits no discharge. Left eye exhibits no discharge. No scleral icterus.  Neck: Normal range of motion. Neck supple. No JVD present. No tracheal deviation present. No thyromegaly present.  Cardiovascular: Normal rate, regular rhythm and intact distal pulses.  Exam reveals no gallop and no friction rub.   No murmur heard. Pulmonary/Chest: Effort normal and breath sounds normal. No respiratory distress. He has no wheezes. He has no rales. He exhibits no tenderness.  cig pack on left front pocket  Abdominal: Soft. Bowel sounds are normal. He exhibits no distension and no mass. There is no tenderness. There is no rebound and no guarding.  Visceral obesity +  Musculoskeletal: Normal range of motion. He exhibits no edema or tenderness.  Lymphadenopathy:    He has no cervical adenopathy.  Neurological: He is alert and oriented to person, place, and time. He has normal reflexes. No cranial nerve deficit. Coordination normal.  Skin: Skin is warm and dry. No rash noted. He is not diaphoretic. No erythema. No pallor.  Psychiatric: He has a normal mood and affect. His behavior is normal. Judgment and thought content normal.  Nursing note and vitals reviewed.  Vitals:   03/05/16 0919  BP: 140/72  Pulse: (!) 57  SpO2: 94%  Weight: 232 lb (105.2 kg)  Height: '5\' 10"'$  (1.778 m)        Assessment:       ICD-9-CM ICD-10-CM   1. Pulmonary emphysema, unspecified emphysema type (HCC) 492.8 J43.9 CT Chest  Wo Contrast     Pulmonary function test  2. Smoking greater than 40 pack years 305.1 F17.210 CT Chest Wo Contrast     Pulmonary function test  3. 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 MuBrand MalesM.D., F.[facility].P Pulmonary and Critical Care Medicine Staff Physician Co[facility]maulmonary and Critical Care Pager: [phone] no answer or between  15:00h - 7:00h: call [phone]  03/05/2016 9:43 AM

## 2016-03-05 NOTE — Telephone Encounter (Signed)
Pt scheduled at 9:15 opening with MR.  Nothing further needed.

## 2016-03-05 NOTE — Patient Instructions (Addendum)
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

## 2016-03-06 ENCOUNTER — Ambulatory Visit: Payer: Commercial Managed Care - HMO | Admitting: Internal Medicine

## 2016-03-07 ENCOUNTER — Ambulatory Visit (HOSPITAL_COMMUNITY): Payer: Commercial Managed Care - HMO | Attending: Cardiology

## 2016-03-07 ENCOUNTER — Other Ambulatory Visit: Payer: Self-pay

## 2016-03-07 DIAGNOSIS — I519 Heart disease, unspecified: Secondary | ICD-10-CM | POA: Insufficient documentation

## 2016-03-07 DIAGNOSIS — I359 Nonrheumatic aortic valve disorder, unspecified: Secondary | ICD-10-CM | POA: Diagnosis not present

## 2016-03-10 ENCOUNTER — Other Ambulatory Visit: Payer: Self-pay

## 2016-03-10 DIAGNOSIS — Z01810 Encounter for preprocedural cardiovascular examination: Secondary | ICD-10-CM

## 2016-03-13 DIAGNOSIS — I10 Essential (primary) hypertension: Secondary | ICD-10-CM | POA: Diagnosis not present

## 2016-03-13 DIAGNOSIS — I714 Abdominal aortic aneurysm, without rupture: Secondary | ICD-10-CM | POA: Diagnosis not present

## 2016-03-13 DIAGNOSIS — J449 Chronic obstructive pulmonary disease, unspecified: Secondary | ICD-10-CM | POA: Diagnosis not present

## 2016-03-13 DIAGNOSIS — E119 Type 2 diabetes mellitus without complications: Secondary | ICD-10-CM | POA: Diagnosis not present

## 2016-03-14 ENCOUNTER — Ambulatory Visit (HOSPITAL_COMMUNITY)
Admission: RE | Admit: 2016-03-14 | Discharge: 2016-03-14 | Disposition: A | Discharge status: Home / Self Care | Payer: Commercial Managed Care - HMO | Source: Ambulatory Visit | Attending: Surgery | Admitting: Surgery

## 2016-03-14 DIAGNOSIS — Z01810 Encounter for preprocedural cardiovascular examination: Secondary | ICD-10-CM

## 2016-03-14 DIAGNOSIS — I2510 Atherosclerotic heart disease of native coronary artery without angina pectoris: Secondary | ICD-10-CM

## 2016-03-14 LAB — VAS US CAROTID
LCCADSYS: -54 cm/s
LCCAPDIAS: 14 cm/s
LEFT ECA DIAS: -20 cm/s
LEFT VERTEBRAL DIAS: 10 cm/s
LICADDIAS: -24 cm/s
LICAPDIAS: -12 cm/s
LICAPSYS: -55 cm/s
Left CCA dist dias: -14 cm/s
Left CCA prox sys: 102 cm/s
Left ICA dist sys: -117 cm/s
RCCAPSYS: 86 cm/s
RIGHT CCA MID DIAS: 19 cm/s
RIGHT ECA DIAS: -18 cm/s
Right CCA prox dias: 14 cm/s
Right cca dist sys: -94 cm/s

## 2016-03-17 DIAGNOSIS — G4733 Obstructive sleep apnea (adult) (pediatric): Secondary | ICD-10-CM | POA: Diagnosis not present

## 2016-03-18 ENCOUNTER — Ambulatory Visit (HOSPITAL_COMMUNITY): Payer: Commercial Managed Care - HMO

## 2016-03-29 DIAGNOSIS — G4733 Obstructive sleep apnea (adult) (pediatric): Secondary | ICD-10-CM | POA: Diagnosis not present

## 2016-04-01 ENCOUNTER — Ambulatory Visit: Payer: Commercial Managed Care - HMO | Admitting: Allergy & Immunology

## 2016-04-02 ENCOUNTER — Encounter (HOSPITAL_COMMUNITY): Payer: Self-pay

## 2016-04-02 ENCOUNTER — Encounter (HOSPITAL_COMMUNITY)
Admission: RE | Admit: 2016-04-02 | Discharge: 2016-04-02 | Disposition: A | Discharge status: Home / Self Care | Payer: Commercial Managed Care - HMO | Source: Ambulatory Visit | Attending: Surgery | Admitting: Surgery

## 2016-04-02 DIAGNOSIS — I714 Abdominal aortic aneurysm, without rupture: Secondary | ICD-10-CM | POA: Insufficient documentation

## 2016-04-02 HISTORY — DX: Personal history of urinary calculi: Z87.442

## 2016-04-02 LAB — URINALYSIS, ROUTINE W REFLEX MICROSCOPIC
Bilirubin Urine: NEGATIVE
GLUCOSE, UA: NEGATIVE mg/dL
Hgb urine dipstick: NEGATIVE
Ketones, ur: NEGATIVE mg/dL
Nitrite: NEGATIVE
PH: 6 (ref 5.0–8.0)
PROTEIN: NEGATIVE mg/dL
Specific Gravity, Urine: 1.006 (ref 1.005–1.030)

## 2016-04-02 LAB — TYPE AND SCREEN
ABO/RH(D): O NEG
Antibody Screen: NEGATIVE

## 2016-04-02 LAB — CBC
HCT: 50.8 % (ref 39.0–52.0)
HEMOGLOBIN: 17.7 g/dL — AB (ref 13.0–17.0)
MCH: 33.5 pg (ref 26.0–34.0)
MCHC: 34.8 g/dL (ref 30.0–36.0)
MCV: 96.2 fL (ref 78.0–100.0)
Platelets: 151 10*3/uL (ref 150–400)
RBC: 5.28 MIL/uL (ref 4.22–5.81)
RDW: 14.1 % (ref 11.5–15.5)
WBC: 10.4 10*3/uL (ref 4.0–10.5)

## 2016-04-02 LAB — BLOOD GAS, ARTERIAL
Acid-Base Excess: 0.4 mmol/L (ref 0.0–2.0)
Bicarbonate: 24.5 mmol/L (ref 20.0–28.0)
Drawn by: 205171
FIO2: 0.21
O2 Saturation: 94.4 %
PCO2 ART: 39.5 mmHg (ref 32.0–48.0)
PH ART: 7.409 (ref 7.350–7.450)
Patient temperature: 98.6
pO2, Arterial: 68.8 mmHg — ABNORMAL LOW (ref 83.0–108.0)

## 2016-04-02 LAB — URINE MICROSCOPIC-ADD ON: RBC / HPF: NONE SEEN RBC/hpf (ref 0–5)

## 2016-04-02 LAB — COMPREHENSIVE METABOLIC PANEL
ALK PHOS: 87 U/L (ref 38–126)
ALT: 23 U/L (ref 17–63)
AST: 18 U/L (ref 15–41)
Albumin: 4 g/dL (ref 3.5–5.0)
Anion gap: 9 (ref 5–15)
BUN: 15 mg/dL (ref 6–20)
CALCIUM: 9.6 mg/dL (ref 8.9–10.3)
CO2: 23 mmol/L (ref 22–32)
CREATININE: 0.94 mg/dL (ref 0.61–1.24)
Chloride: 110 mmol/L (ref 101–111)
Glucose, Bld: 121 mg/dL — ABNORMAL HIGH (ref 65–99)
Potassium: 3.9 mmol/L (ref 3.5–5.1)
Sodium: 142 mmol/L (ref 135–145)
Total Bilirubin: 0.6 mg/dL (ref 0.3–1.2)
Total Protein: 6.8 g/dL (ref 6.5–8.1)

## 2016-04-02 LAB — APTT: APTT: 36 s (ref 24–36)

## 2016-04-02 LAB — SURGICAL PCR SCREEN
MRSA, PCR: NEGATIVE
STAPHYLOCOCCUS AUREUS: NEGATIVE

## 2016-04-02 LAB — PROTIME-INR
INR: 0.99
PROTHROMBIN TIME: 13 s (ref 11.4–15.2)

## 2016-04-02 LAB — GLUCOSE, CAPILLARY: Glucose-Capillary: 152 mg/dL — ABNORMAL HIGH (ref 65–99)

## 2016-04-02 LAB — ABO/RH: ABO/RH(D): O NEG

## 2016-04-02 NOTE — Pre-Procedure Instructions (Signed)
KHAZA BLANSETT  04/02/2016      [facility] - [place], [place] [address] [place] [address] Phone: [phone] Fax: [phone]  [facility] - [place], [place] [address] Phone: [phone] Fax: [phone], [place], [place] [address] [place] [address] Phone: [phone] Fax: [phone]    Your procedure is scheduled on Wednesday  04/09/16  Report to [place] at 630 A.M.  Call this number if you have problems the morning of surgery:  [phone]   Remember:  Do not eat food or drink liquids after midnight.  Take these medicines the morning of surgery with A SIP OF WATER   ALBUTEROL, ALLOPURINOL, AMLODIPINE (NORVASC), SYMBICORT, METOPROLOL (TOPROL XL), SERTRALINE (ZOLOFT), SPIRIVA       (STOP 7 DAYS PRIOR TO SURGERY ASPIRIN OR ASPIRIN PRODUCTS, IBUPROFEN/ ADVIL/ MOTRIN, GOODY POWDERS, BC'S, HERBAL MEDICINES)    How to Manage Your Diabetes Before and After Surgery  Why is it important to control my blood sugar before and after surgery? . Improving blood sugar levels before and after surgery helps healing and can limit problems. . A way of improving blood sugar control is eating a healthy diet by: o  Eating less sugar and carbohydrates o  Increasing activity/exercise o  Talking with your doctor about reaching your blood sugar goals . High blood sugars (greater than 180 mg/dL) can raise your risk of infections and slow your recovery, so you will need to focus on controlling your diabetes during the weeks before surgery. . Make sure that the doctor who takes care of your diabetes knows about your planned surgery including the date and location.  How do I manage my blood sugar before surgery? . Check your blood sugar at least 4 times a day, starting 2 days before surgery, to make  sure that the level is not too high or low. o Check your blood sugar the morning of your surgery when you wake up and every 2 hours until you get to the Short Stay unit. . If your blood sugar is less than 70 mg/dL, you will need to treat for low blood sugar: o Do not take insulin. o Treat a low blood sugar (less than 70 mg/dL) with  cup of clear juice (cranberry or apple), 4 glucose tablets, OR glucose gel. o Recheck blood sugar in 15 minutes after treatment (to make sure it is greater than 70 mg/dL). If your blood sugar is not greater than 70 mg/dL on recheck, call [phone] for further instructions. . Report your blood sugar to the short stay nurse when you get to Short Stay.  . If you are admitted to the hospital after surgery: o Your blood sugar will be checked by the staff and you will probably be given insulin after surgery (instead of oral diabetes medicines) to make sure you have good blood sugar levels. o The goal for blood sugar control after surgery is 80-180 mg/dL.              WHAT DO I DO ABOUT MY DIABETES MEDICATION?   [name] Do not take oral diabetes medicines (pills) the morning of surgery      Other Instructions:          Patient Signature:  Date:   Nurse Signature:  Date:  Reviewed and Endorsed by [facility] Patient Education Committee, August 2015  Do not wear jewelry, make-up or nail polish.  Do not wear lotions, powders, or perfumes, or deoderant.  Do not shave 48 hours prior to surgery.  Men may shave face and neck.  Do not bring valuables to the hospital.  [facility] is not responsible for any belongings or valuables.  Contacts, dentures or bridgework may not be worn into surgery.  Leave your suitcase in the car.  After surgery it may be brought to your room.  For patients admitted to the hospital, discharge time will be determined by your treatment team.  Patients discharged the day of surgery will not be allowed to drive home.   Name and  phone number of your driver:    Special instructions:  SEE  PREPARING FOR SURGERY  Please read over the following fact sheets that you were given. MRSA Information and Surgical Site Infection Prevention

## 2016-04-03 LAB — HEMOGLOBIN A1C
HEMOGLOBIN A1C: 6 % — AB (ref 4.8–5.6)
MEAN PLASMA GLUCOSE: 126 mg/dL

## 2016-04-03 NOTE — Progress Notes (Signed)
Anesthesia Chart Review:  Pt is a 75 year old male scheduled for abdominal aortic endovascular fenestrated stent graft on 04/09/2016 with Harold Barban, MD.    - Pulmonologist is Brand Males, last office visit 03/05/16.  - Cardiologist is Sherren Mocha, MD, last office visit 02/08/16 with Truitt Merle, NP.  - PCP is Sinda Du, MD  PMH includes:  AAA, HTN, DM, hyperlipidemia, COPD, OSA.  Current smoker. BMI 33. S/p TURP 01/07/11.   Medications include: albuterol, amlodipine, lipitor, symbicort, lasix, atrovent, levalbuterol, losartan, metformin, metoprolol, spiriva  Preoperative labs reviewed.  HgbA1c 6.0, 121  CT chest 02/20/16:  1. Multiple 1 tiny pulmonary nodules in the lungs, largest of which has a mean diameter of only 5 mm, as above.  2. Aortic atherosclerosis, in addition to left main and 3 vessel coronary artery disease.  3. There are calcifications of the aortic valve. Echocardiographic correlation for evaluation of potential valvular dysfunction may be warranted if clinically indicated. 4. Large infrarenal abdominal aortic aneurysm measuring at least 5.1 x 4.7 cm incompletely visualized, but appears grossly similar to the recent prior examination. 5. Mild diffuse bronchial thickening with mild centrilobular and paraseptal emphysema; imaging findings suggestive of underlying COPD.  EKG 02/08/16: Sinus bradycardia (55 bpm). LAD.  Carotid duplex 03/14/16: Less than 40% bilateral internal carotid artery stenosis.  Echo 03/07/16:  - Left ventricle: The cavity size was normal. Wall thickness was increased in a pattern of mild LVH. Systolic function was normal. The estimated ejection fraction was in the range of 60% to 65%. Wall motion was normal; there were no regional wall motion abnormalities. Features are consistent with a pseudonormal left ventricular filling pattern, with concomitant abnormal relaxation and increased filling pressure (grade 2 diastolic dysfunction). Doppler  parameters are consistent with high ventricular filling pressure. - Mitral valve: There was mild regurgitation. - Left atrium: The atrium was moderately dilated. - Pulmonary arteries: Systolic pressure was mildly increased. PA peak pressure: 37 mm Hg (S).  Nuclear stress test 02/20/16:   Nuclear stress EF: 58%.  There was no ST segment deviation noted during stress.  The study is normal.  This is a low risk study.   If no changes, I anticipate pt can proceed with surgery as scheduled.   Willeen Cass, FNP-BC [facility] Short Stay Surgical Center/Anesthesiology Phone: [phone] 04/03/2016 2:11 PM

## 2016-04-09 ENCOUNTER — Inpatient Hospital Stay (HOSPITAL_COMMUNITY)
Admission: RE | Admit: 2016-04-09 | Discharge: 2016-04-14 | DRG: 269 | Disposition: A | Discharge status: Home Health | Payer: Commercial Managed Care - HMO | Source: Ambulatory Visit | Attending: Surgery | Admitting: Surgery

## 2016-04-09 ENCOUNTER — Inpatient Hospital Stay (HOSPITAL_COMMUNITY): Payer: Commercial Managed Care - HMO | Admitting: Emergency Medicine

## 2016-04-09 ENCOUNTER — Encounter (HOSPITAL_COMMUNITY)
Admission: RE | Disposition: A | Discharge status: Home Health | Payer: Self-pay | Source: Ambulatory Visit | Attending: Surgery

## 2016-04-09 ENCOUNTER — Inpatient Hospital Stay (HOSPITAL_COMMUNITY): Payer: Commercial Managed Care - HMO | Admitting: Anesthesiology

## 2016-04-09 ENCOUNTER — Inpatient Hospital Stay (HOSPITAL_COMMUNITY): Payer: Commercial Managed Care - HMO

## 2016-04-09 ENCOUNTER — Encounter (HOSPITAL_COMMUNITY): Payer: Self-pay | Admitting: Urology

## 2016-04-09 DIAGNOSIS — R29898 Other symptoms and signs involving the musculoskeletal system: Secondary | ICD-10-CM | POA: Diagnosis not present

## 2016-04-09 DIAGNOSIS — I714 Abdominal aortic aneurysm, without rupture, unspecified: Secondary | ICD-10-CM | POA: Diagnosis present

## 2016-04-09 DIAGNOSIS — R338 Other retention of urine: Secondary | ICD-10-CM | POA: Diagnosis present

## 2016-04-09 DIAGNOSIS — Z7984 Long term (current) use of oral hypoglycemic drugs: Secondary | ICD-10-CM

## 2016-04-09 DIAGNOSIS — G4733 Obstructive sleep apnea (adult) (pediatric): Secondary | ICD-10-CM | POA: Diagnosis not present

## 2016-04-09 DIAGNOSIS — R911 Solitary pulmonary nodule: Secondary | ICD-10-CM | POA: Diagnosis present

## 2016-04-09 DIAGNOSIS — F17210 Nicotine dependence, cigarettes, uncomplicated: Secondary | ICD-10-CM | POA: Diagnosis not present

## 2016-04-09 DIAGNOSIS — J449 Chronic obstructive pulmonary disease, unspecified: Secondary | ICD-10-CM | POA: Diagnosis present

## 2016-04-09 DIAGNOSIS — E119 Type 2 diabetes mellitus without complications: Secondary | ICD-10-CM | POA: Diagnosis not present

## 2016-04-09 DIAGNOSIS — I10 Essential (primary) hypertension: Secondary | ICD-10-CM | POA: Diagnosis present

## 2016-04-09 DIAGNOSIS — M5126 Other intervertebral disc displacement, lumbar region: Secondary | ICD-10-CM | POA: Diagnosis not present

## 2016-04-09 DIAGNOSIS — R200 Anesthesia of skin: Secondary | ICD-10-CM

## 2016-04-09 DIAGNOSIS — E7800 Pure hypercholesterolemia, unspecified: Secondary | ICD-10-CM | POA: Diagnosis not present

## 2016-04-09 DIAGNOSIS — I722 Aneurysm of renal artery: Secondary | ICD-10-CM | POA: Diagnosis present

## 2016-04-09 DIAGNOSIS — M109 Gout, unspecified: Secondary | ICD-10-CM | POA: Diagnosis present

## 2016-04-09 DIAGNOSIS — N401 Enlarged prostate with lower urinary tract symptoms: Secondary | ICD-10-CM | POA: Diagnosis not present

## 2016-04-09 DIAGNOSIS — R918 Other nonspecific abnormal finding of lung field: Secondary | ICD-10-CM | POA: Diagnosis not present

## 2016-04-09 DIAGNOSIS — F329 Major depressive disorder, single episode, unspecified: Secondary | ICD-10-CM | POA: Diagnosis present

## 2016-04-09 DIAGNOSIS — T508X5A Adverse effect of diagnostic agents, initial encounter: Secondary | ICD-10-CM | POA: Diagnosis not present

## 2016-04-09 DIAGNOSIS — Z95828 Presence of other vascular implants and grafts: Secondary | ICD-10-CM

## 2016-04-09 DIAGNOSIS — N179 Acute kidney failure, unspecified: Secondary | ICD-10-CM | POA: Diagnosis not present

## 2016-04-09 DIAGNOSIS — R269 Unspecified abnormalities of gait and mobility: Secondary | ICD-10-CM | POA: Diagnosis not present

## 2016-04-09 DIAGNOSIS — R609 Edema, unspecified: Secondary | ICD-10-CM | POA: Diagnosis not present

## 2016-04-09 DIAGNOSIS — Z79899 Other long term (current) drug therapy: Secondary | ICD-10-CM

## 2016-04-09 DIAGNOSIS — R262 Difficulty in walking, not elsewhere classified: Secondary | ICD-10-CM

## 2016-04-09 DIAGNOSIS — M5430 Sciatica, unspecified side: Secondary | ICD-10-CM | POA: Diagnosis not present

## 2016-04-09 DIAGNOSIS — Z8679 Personal history of other diseases of the circulatory system: Secondary | ICD-10-CM

## 2016-04-09 HISTORY — PX: ABDOMINAL AORTIC ENDOVASCULAR FENESTRATED STENT GRAFT: SHX6430

## 2016-04-09 LAB — BASIC METABOLIC PANEL
ANION GAP: 9 (ref 5–15)
BUN: 14 mg/dL (ref 6–20)
CHLORIDE: 106 mmol/L (ref 101–111)
CO2: 23 mmol/L (ref 22–32)
CREATININE: 1.52 mg/dL — AB (ref 0.61–1.24)
Calcium: 8.4 mg/dL — ABNORMAL LOW (ref 8.9–10.3)
GFR calc non Af Amer: 43 mL/min — ABNORMAL LOW (ref >60)
GFR, EST AFRICAN AMERICAN: 50 mL/min — AB (ref >60)
Glucose, Bld: 135 mg/dL — ABNORMAL HIGH (ref 65–99)
POTASSIUM: 4.2 mmol/L (ref 3.5–5.1)
Sodium: 138 mmol/L (ref 135–145)

## 2016-04-09 LAB — GLUCOSE, CAPILLARY
GLUCOSE-CAPILLARY: 129 mg/dL — AB (ref 65–99)
Glucose-Capillary: 144 mg/dL — ABNORMAL HIGH (ref 65–99)

## 2016-04-09 LAB — MAGNESIUM: MAGNESIUM: 1.8 mg/dL (ref 1.7–2.4)

## 2016-04-09 LAB — CBC
HEMATOCRIT: 45.7 % (ref 39.0–52.0)
HEMOGLOBIN: 15.6 g/dL (ref 13.0–17.0)
MCH: 33 pg (ref 26.0–34.0)
MCHC: 34.1 g/dL (ref 30.0–36.0)
MCV: 96.6 fL (ref 78.0–100.0)
Platelets: 146 10*3/uL — ABNORMAL LOW (ref 150–400)
RBC: 4.73 MIL/uL (ref 4.22–5.81)
RDW: 14.2 % (ref 11.5–15.5)
WBC: 15.5 10*3/uL — ABNORMAL HIGH (ref 4.0–10.5)

## 2016-04-09 LAB — PROTIME-INR
INR: 1.08
Prothrombin Time: 14 seconds (ref 11.4–15.2)

## 2016-04-09 LAB — APTT: APTT: 39 s — AB (ref 24–36)

## 2016-04-09 SURGERY — ABDOMINAL AORTIC ENDOVASCULAR FENESTRATED STENT GRAFT
Anesthesia: General | Site: Abdomen

## 2016-04-09 MED ORDER — FUROSEMIDE 40 MG PO TABS
40.0000 mg | ORAL_TABLET | Freq: Every day | ORAL | Status: DC
  Administered 2016-04-09 – 2016-04-14 (×6): 40 mg via ORAL
  Filled 2016-04-09 (×6): qty 1

## 2016-04-09 MED ORDER — OXYCODONE-ACETAMINOPHEN 5-325 MG PO TABS
ORAL_TABLET | ORAL | Status: AC
  Filled 2016-04-09: qty 2

## 2016-04-09 MED ORDER — DOCUSATE SODIUM 100 MG PO CAPS
100.0000 mg | ORAL_CAPSULE | Freq: Every day | ORAL | Status: DC
  Administered 2016-04-10 – 2016-04-14 (×5): 100 mg via ORAL
  Filled 2016-04-09 (×5): qty 1

## 2016-04-09 MED ORDER — LABETALOL HCL 5 MG/ML IV SOLN
INTRAVENOUS | Status: DC | PRN
  Administered 2016-04-09: 10 mg via INTRAVENOUS

## 2016-04-09 MED ORDER — DEXTROSE 5 % IV SOLN
INTRAVENOUS | Status: AC
  Filled 2016-04-09: qty 1.5

## 2016-04-09 MED ORDER — FENTANYL CITRATE (PF) 100 MCG/2ML IJ SOLN: 25.0000 ug | INTRAMUSCULAR | Status: DC | PRN

## 2016-04-09 MED ORDER — TIOTROPIUM BROMIDE MONOHYDRATE 18 MCG IN CAPS
18.0000 ug | ORAL_CAPSULE | Freq: Every day | RESPIRATORY_TRACT | Status: DC
  Administered 2016-04-10 – 2016-04-14 (×5): 18 ug via RESPIRATORY_TRACT
  Filled 2016-04-09: qty 5

## 2016-04-09 MED ORDER — HEPARIN SODIUM (PORCINE) 1000 UNIT/ML IJ SOLN
INTRAMUSCULAR | Status: AC
  Filled 2016-04-09: qty 2

## 2016-04-09 MED ORDER — ONDANSETRON HCL 4 MG/2ML IJ SOLN: 4.0000 mg | Freq: Four times a day (QID) | INTRAMUSCULAR | Status: DC | PRN

## 2016-04-09 MED ORDER — HEPARIN SODIUM (PORCINE) 5000 UNIT/ML IJ SOLN: 5000.0000 [IU] | Freq: Three times a day (TID) | INTRAMUSCULAR | Status: DC

## 2016-04-09 MED ORDER — AMLODIPINE BESYLATE 10 MG PO TABS
10.0000 mg | ORAL_TABLET | Freq: Every day | ORAL | Status: DC
  Administered 2016-04-10 – 2016-04-14 (×5): 10 mg via ORAL
  Filled 2016-04-09 (×5): qty 1

## 2016-04-09 MED ORDER — PROPOFOL 10 MG/ML IV BOLUS
INTRAVENOUS | Status: DC | PRN
  Administered 2016-04-09: 100 mg via INTRAVENOUS
  Administered 2016-04-09: 20 mg via INTRAVENOUS
  Administered 2016-04-09: 100 mg via INTRAVENOUS
  Administered 2016-04-09 (×2): 50 mg via INTRAVENOUS

## 2016-04-09 MED ORDER — PHENYLEPHRINE HCL 10 MG/ML IJ SOLN
INTRAVENOUS | Status: DC | PRN
  Administered 2016-04-09: 15 ug/min via INTRAVENOUS

## 2016-04-09 MED ORDER — FENTANYL CITRATE (PF) 250 MCG/5ML IJ SOLN
INTRAMUSCULAR | Status: AC
  Filled 2016-04-09: qty 5

## 2016-04-09 MED ORDER — HYDRALAZINE HCL 20 MG/ML IJ SOLN
5.0000 mg | INTRAMUSCULAR | Status: DC | PRN
  Administered 2016-04-09: 5 mg via INTRAVENOUS

## 2016-04-09 MED ORDER — FENTANYL CITRATE (PF) 100 MCG/2ML IJ SOLN
INTRAMUSCULAR | Status: AC
  Administered 2016-04-09: 100 ug
  Filled 2016-04-09: qty 2

## 2016-04-09 MED ORDER — MIDAZOLAM HCL 2 MG/2ML IJ SOLN
INTRAMUSCULAR | Status: AC
  Filled 2016-04-09: qty 2

## 2016-04-09 MED ORDER — AZELASTINE HCL 0.1 % NA SOLN
2.0000 | Freq: Two times a day (BID) | NASAL | Status: DC
  Administered 2016-04-10 – 2016-04-13 (×7): 2 via NASAL
  Filled 2016-04-09 (×3): qty 30

## 2016-04-09 MED ORDER — MAGNESIUM HYDROXIDE 400 MG/5ML PO SUSP: 30.0000 mL | Freq: Every day | ORAL | Status: DC | PRN

## 2016-04-09 MED ORDER — MAGNESIUM SULFATE 2 GM/50ML IV SOLN: 2.0000 g | Freq: Every day | INTRAVENOUS | Status: DC | PRN

## 2016-04-09 MED ORDER — EPHEDRINE 5 MG/ML INJ
INTRAVENOUS | Status: AC
  Filled 2016-04-09: qty 10

## 2016-04-09 MED ORDER — HEPARIN SODIUM (PORCINE) 1000 UNIT/ML IJ SOLN
INTRAMUSCULAR | Status: DC | PRN
  Administered 2016-04-09: 1 mL via INTRAVENOUS
  Administered 2016-04-09 (×2): 2 mL via INTRAVENOUS
  Administered 2016-04-09: 1 mL via INTRAVENOUS
  Administered 2016-04-09: 10 mL via INTRAVENOUS

## 2016-04-09 MED ORDER — LIDOCAINE HCL (CARDIAC) 20 MG/ML IV SOLN
INTRAVENOUS | Status: DC | PRN
Start: 2016-04-09 — End: 2016-04-09
  Administered 2016-04-09: 100 mg via INTRAVENOUS

## 2016-04-09 MED ORDER — LIDOCAINE 2% (20 MG/ML) 5 ML SYRINGE
INTRAMUSCULAR | Status: AC
  Filled 2016-04-09: qty 5

## 2016-04-09 MED ORDER — ONDANSETRON HCL 4 MG/2ML IJ SOLN: 4.0000 mg | Freq: Once | INTRAMUSCULAR | Status: DC | PRN

## 2016-04-09 MED ORDER — ACETAMINOPHEN 325 MG RE SUPP
325.0000 mg | RECTAL | Status: DC | PRN
Start: 2016-04-09 — End: 2016-04-14

## 2016-04-09 MED ORDER — SUCCINYLCHOLINE CHLORIDE 200 MG/10ML IV SOSY
PREFILLED_SYRINGE | INTRAVENOUS | Status: AC
  Filled 2016-04-09: qty 10

## 2016-04-09 MED ORDER — CHLORHEXIDINE GLUCONATE CLOTH 2 % EX PADS: 6.0000 | MEDICATED_PAD | Freq: Once | CUTANEOUS | Status: DC

## 2016-04-09 MED ORDER — DEXTROSE 5 % IV SOLN
1.5000 g | Freq: Two times a day (BID) | INTRAVENOUS | Status: AC
  Administered 2016-04-10 (×2): 1.5 g via INTRAVENOUS
  Filled 2016-04-09 (×3): qty 1.5

## 2016-04-09 MED ORDER — LABETALOL HCL 5 MG/ML IV SOLN
10.0000 mg | INTRAVENOUS | Status: DC | PRN
  Administered 2016-04-09: 10 mg via INTRAVENOUS
  Filled 2016-04-09: qty 4

## 2016-04-09 MED ORDER — ACETAMINOPHEN 325 MG PO TABS: 325.0000 mg | ORAL_TABLET | ORAL | Status: DC | PRN

## 2016-04-09 MED ORDER — DEXTROSE 5 % IV SOLN
1.5000 g | INTRAVENOUS | Status: AC
  Administered 2016-04-09 (×2): 1.5 g via INTRAVENOUS

## 2016-04-09 MED ORDER — SODIUM CHLORIDE 0.9 % IV SOLN: INTRAVENOUS | Status: DC

## 2016-04-09 MED ORDER — LACTATED RINGERS IV SOLN
INTRAVENOUS | Status: DC | PRN
  Administered 2016-04-09 (×2): via INTRAVENOUS

## 2016-04-09 MED ORDER — MORPHINE SULFATE (PF) 2 MG/ML IV SOLN
2.0000 mg | INTRAVENOUS | Status: DC | PRN
  Administered 2016-04-10 (×4): 2 mg via INTRAVENOUS
  Administered 2016-04-11 – 2016-04-12 (×2): 4 mg via INTRAVENOUS
  Administered 2016-04-13: 2 mg via INTRAVENOUS
  Filled 2016-04-09 (×3): qty 1
  Filled 2016-04-09: qty 2
  Filled 2016-04-09: qty 1
  Filled 2016-04-09: qty 2
  Filled 2016-04-09: qty 1

## 2016-04-09 MED ORDER — METOPROLOL TARTRATE 5 MG/5ML IV SOLN
2.0000 mg | INTRAVENOUS | Status: DC | PRN
Start: 2016-04-09 — End: 2016-04-14

## 2016-04-09 MED ORDER — SODIUM CHLORIDE 0.9 % IV SOLN: 500.0000 mL | Freq: Once | INTRAVENOUS | Status: DC | PRN

## 2016-04-09 MED ORDER — ROCURONIUM BROMIDE 10 MG/ML (PF) SYRINGE
PREFILLED_SYRINGE | INTRAVENOUS | Status: AC
  Filled 2016-04-09: qty 10

## 2016-04-09 MED ORDER — SUCCINYLCHOLINE CHLORIDE 20 MG/ML IJ SOLN
INTRAMUSCULAR | Status: DC | PRN
  Administered 2016-04-09: 100 mg via INTRAVENOUS

## 2016-04-09 MED ORDER — SODIUM CHLORIDE 0.9 % IV SOLN
INTRAVENOUS | Status: DC
  Administered 2016-04-09 – 2016-04-11 (×2): via INTRAVENOUS
  Administered 2016-04-11: 100 mL/h via INTRAVENOUS

## 2016-04-09 MED ORDER — LACTATED RINGERS IV SOLN
INTRAVENOUS | Status: DC | PRN
  Administered 2016-04-09: 08:00:00 via INTRAVENOUS

## 2016-04-09 MED ORDER — IODIXANOL 320 MG/ML IV SOLN
INTRAVENOUS | Status: DC | PRN
  Administered 2016-04-09: 10 mL via INTRAVENOUS
  Administered 2016-04-09: 150 mL via INTRAVENOUS

## 2016-04-09 MED ORDER — 0.9 % SODIUM CHLORIDE (POUR BTL) OPTIME
TOPICAL | Status: DC | PRN
Start: 2016-04-09 — End: 2016-04-09
  Administered 2016-04-09: 1000 mL

## 2016-04-09 MED ORDER — PROTAMINE SULFATE 10 MG/ML IV SOLN
INTRAVENOUS | Status: DC | PRN
  Administered 2016-04-09 (×5): 10 mg via INTRAVENOUS

## 2016-04-09 MED ORDER — BISACODYL 10 MG RE SUPP: 10.0000 mg | Freq: Every day | RECTAL | Status: DC | PRN

## 2016-04-09 MED ORDER — MOMETASONE FURO-FORMOTEROL FUM 200-5 MCG/ACT IN AERO
2.0000 | INHALATION_SPRAY | Freq: Two times a day (BID) | RESPIRATORY_TRACT | Status: DC
  Administered 2016-04-09 – 2016-04-14 (×9): 2 via RESPIRATORY_TRACT
  Filled 2016-04-09: qty 8.8

## 2016-04-09 MED ORDER — PHENYLEPHRINE 40 MCG/ML (10ML) SYRINGE FOR IV PUSH (FOR BLOOD PRESSURE SUPPORT)
PREFILLED_SYRINGE | INTRAVENOUS | Status: AC
  Filled 2016-04-09: qty 10

## 2016-04-09 MED ORDER — PROTAMINE SULFATE 10 MG/ML IV SOLN
INTRAVENOUS | Status: AC
  Filled 2016-04-09: qty 5

## 2016-04-09 MED ORDER — PROPOFOL 10 MG/ML IV BOLUS
INTRAVENOUS | Status: AC
  Filled 2016-04-09: qty 20

## 2016-04-09 MED ORDER — ATORVASTATIN CALCIUM 20 MG PO TABS
20.0000 mg | ORAL_TABLET | Freq: Every day | ORAL | Status: DC
  Administered 2016-04-09 – 2016-04-14 (×6): 20 mg via ORAL
  Filled 2016-04-09 (×6): qty 1

## 2016-04-09 MED ORDER — POTASSIUM CHLORIDE CRYS ER 20 MEQ PO TBCR: 20.0000 meq | EXTENDED_RELEASE_TABLET | Freq: Every day | ORAL | Status: DC | PRN

## 2016-04-09 MED ORDER — EPHEDRINE SULFATE 50 MG/ML IJ SOLN
INTRAMUSCULAR | Status: DC | PRN
  Administered 2016-04-09: 10 mg via INTRAVENOUS
  Administered 2016-04-09: 5 mg via INTRAVENOUS
  Administered 2016-04-09: 2.5 mg via INTRAVENOUS

## 2016-04-09 MED ORDER — CEFUROXIME SODIUM 1.5 G IJ SOLR
1.5000 g | INTRAMUSCULAR | Status: DC
  Filled 2016-04-09: qty 1.5

## 2016-04-09 MED ORDER — GLYCOPYRROLATE 0.2 MG/ML IJ SOLN
INTRAMUSCULAR | Status: DC | PRN
  Administered 2016-04-09: 0.2 mg via INTRAVENOUS

## 2016-04-09 MED ORDER — ONDANSETRON HCL 4 MG/2ML IJ SOLN
INTRAMUSCULAR | Status: DC | PRN
  Administered 2016-04-09: 4 mg via INTRAVENOUS

## 2016-04-09 MED ORDER — ONDANSETRON HCL 4 MG/2ML IJ SOLN
INTRAMUSCULAR | Status: AC
  Filled 2016-04-09: qty 2

## 2016-04-09 MED ORDER — SUGAMMADEX SODIUM 200 MG/2ML IV SOLN
INTRAVENOUS | Status: AC
  Filled 2016-04-09: qty 2

## 2016-04-09 MED ORDER — HYDROCODONE-ACETAMINOPHEN 7.5-325 MG PO TABS: 1.0000 | ORAL_TABLET | Freq: Once | ORAL | Status: DC | PRN

## 2016-04-09 MED ORDER — ALLOPURINOL 300 MG PO TABS
300.0000 mg | ORAL_TABLET | Freq: Every day | ORAL | Status: DC
  Administered 2016-04-09 – 2016-04-14 (×6): 300 mg via ORAL
  Filled 2016-04-09 (×6): qty 1

## 2016-04-09 MED ORDER — FENTANYL CITRATE (PF) 100 MCG/2ML IJ SOLN
INTRAMUSCULAR | Status: DC | PRN
  Administered 2016-04-09 (×2): 50 ug via INTRAVENOUS
  Administered 2016-04-09: 150 ug via INTRAVENOUS
  Administered 2016-04-09 (×3): 50 ug via INTRAVENOUS

## 2016-04-09 MED ORDER — OXYCODONE-ACETAMINOPHEN 5-325 MG PO TABS
1.0000 | ORAL_TABLET | ORAL | Status: DC | PRN
  Administered 2016-04-09: 2 via ORAL

## 2016-04-09 MED ORDER — ALUM & MAG HYDROXIDE-SIMETH 200-200-20 MG/5ML PO SUSP: 15.0000 mL | ORAL | Status: DC | PRN

## 2016-04-09 MED ORDER — SUGAMMADEX SODIUM 200 MG/2ML IV SOLN
INTRAVENOUS | Status: DC | PRN
  Administered 2016-04-09: 200 mg via INTRAVENOUS

## 2016-04-09 MED ORDER — HYDRALAZINE HCL 20 MG/ML IJ SOLN
INTRAMUSCULAR | Status: AC
  Filled 2016-04-09: qty 1

## 2016-04-09 MED ORDER — ROCURONIUM BROMIDE 100 MG/10ML IV SOLN
INTRAVENOUS | Status: DC | PRN
  Administered 2016-04-09: 20 mg via INTRAVENOUS
  Administered 2016-04-09: 10 mg via INTRAVENOUS
  Administered 2016-04-09 (×3): 20 mg via INTRAVENOUS
  Administered 2016-04-09: 10 mg via INTRAVENOUS
  Administered 2016-04-09 (×2): 20 mg via INTRAVENOUS
  Administered 2016-04-09: 10 mg via INTRAVENOUS
  Administered 2016-04-09: 50 mg via INTRAVENOUS

## 2016-04-09 MED ORDER — LABETALOL HCL 5 MG/ML IV SOLN
INTRAVENOUS | Status: AC
  Filled 2016-04-09: qty 4

## 2016-04-09 MED ORDER — SERTRALINE HCL 50 MG PO TABS
50.0000 mg | ORAL_TABLET | Freq: Every day | ORAL | Status: DC
  Administered 2016-04-09 – 2016-04-14 (×6): 50 mg via ORAL
  Filled 2016-04-09 (×6): qty 1

## 2016-04-09 MED ORDER — ALBUTEROL SULFATE (2.5 MG/3ML) 0.083% IN NEBU: 3.0000 mL | INHALATION_SOLUTION | Freq: Four times a day (QID) | RESPIRATORY_TRACT | Status: DC | PRN

## 2016-04-09 MED ORDER — GUAIFENESIN-DM 100-10 MG/5ML PO SYRP: 15.0000 mL | ORAL_SOLUTION | ORAL | Status: DC | PRN

## 2016-04-09 MED ORDER — PHENOL 1.4 % MT LIQD: 1.0000 | OROMUCOSAL | Status: DC | PRN

## 2016-04-09 MED ORDER — ROCURONIUM BROMIDE 10 MG/ML (PF) SYRINGE
PREFILLED_SYRINGE | INTRAVENOUS | Status: AC
  Filled 2016-04-09: qty 20

## 2016-04-09 MED ORDER — PANTOPRAZOLE SODIUM 40 MG PO TBEC
40.0000 mg | DELAYED_RELEASE_TABLET | Freq: Every day | ORAL | Status: DC
  Administered 2016-04-09 – 2016-04-14 (×6): 40 mg via ORAL
  Filled 2016-04-09 (×6): qty 1

## 2016-04-09 MED ORDER — SODIUM CHLORIDE 0.9 % IV SOLN
INTRAVENOUS | Status: DC | PRN
  Administered 2016-04-09 (×2): 500 mL

## 2016-04-09 MED ORDER — METOPROLOL SUCCINATE ER 25 MG PO TB24
25.0000 mg | ORAL_TABLET | Freq: Every day | ORAL | Status: DC
  Administered 2016-04-11 – 2016-04-14 (×4): 25 mg via ORAL
  Filled 2016-04-09 (×4): qty 1

## 2016-04-09 SURGICAL SUPPLY — 91 items
BAG DECANTER FOR FLEXI CONT (MISCELLANEOUS) IMPLANT
BAG SNAP BAND KOVER 36X36 (MISCELLANEOUS) ×6 IMPLANT
BALLN CODA OCL 2-9.0-35-120-3 (BALLOONS) ×3
BALLN MUSTANG 7X20X135 (BALLOONS) ×3
BALLOON COD OCL 2-9.0-35-120-3 (BALLOONS) ×1 IMPLANT
BALLOON MUSTANG 7X20X135 (BALLOONS) ×1 IMPLANT
CANISTER SUCTION 2500CC (MISCELLANEOUS) ×3 IMPLANT
CATH ANGIO 5F BER 65CM (CATHETERS) ×3 IMPLANT
CATH ANGIO 5F BER2 65CM (CATHETERS) ×3 IMPLANT
CATH BALLN ADVANCE 35LP (CATHETERS) ×3 IMPLANT
CATH OMNI FLUSH .035X70CM (CATHETERS) ×3 IMPLANT
CATH QUICKCROSS SUPP .035X90CM (MICROCATHETER) ×3 IMPLANT
CATH VANSCHIE 4 5FR RDC 65CM (CATHETERS) ×1 IMPLANT
COVER MAYO STAND STRL (DRAPES) ×6 IMPLANT
COVER PROBE W GEL 5X96 (DRAPES) ×3 IMPLANT
DERMABOND ADVANCED (GAUZE/BANDAGES/DRESSINGS) ×2
DERMABOND ADVANCED .7 DNX12 (GAUZE/BANDAGES/DRESSINGS) ×1 IMPLANT
DEVICE CLOSURE PERCLS PRGLD 6F (VASCULAR PRODUCTS) ×2 IMPLANT
DEVICE TORQUE H2O (MISCELLANEOUS) ×3 IMPLANT
DRAIN CHANNEL 10F 3/8 F FF (DRAIN) IMPLANT
DRAIN CHANNEL 10M FLAT 3/4 FLT (DRAIN) IMPLANT
DRAPE PROXIMA HALF (DRAPES) ×6 IMPLANT
DRAPE ZERO GRAVITY STERILE (DRAPES) ×6 IMPLANT
DRSG TEGADERM 2-3/8X2-3/4 SM (GAUZE/BANDAGES/DRESSINGS) ×6 IMPLANT
DRSG TEGADERM 4X4.75 (GAUZE/BANDAGES/DRESSINGS) ×3 IMPLANT
DRYSEAL FLEXSHEATH 18FR 33CM (SHEATH) ×2
ELECT CAUTERY BLADE 6.4 (BLADE) ×3 IMPLANT
ELECT REM PT RETURN 9FT ADLT (ELECTROSURGICAL) ×6
ELECTRODE REM PT RTRN 9FT ADLT (ELECTROSURGICAL) ×2 IMPLANT
EVACUATOR 3/16  PVC DRAIN (DRAIN)
EVACUATOR 3/16 PVC DRAIN (DRAIN) IMPLANT
EVACUATOR SILICONE 100CC (DRAIN) IMPLANT
GAUZE SPONGE 2X2 8PLY STRL LF (GAUZE/BANDAGES/DRESSINGS) ×3 IMPLANT
GLOVE BIO SURGEON STRL SZ 6.5 (GLOVE) ×8 IMPLANT
GLOVE BIO SURGEONS STRL SZ 6.5 (GLOVE) ×4
GLOVE BIOGEL PI IND STRL 6.5 (GLOVE) ×5 IMPLANT
GLOVE BIOGEL PI IND STRL 7.5 (GLOVE) ×2 IMPLANT
GLOVE BIOGEL PI INDICATOR 6.5 (GLOVE) ×10
GLOVE BIOGEL PI INDICATOR 7.5 (GLOVE) ×4
GLOVE ECLIPSE 7.5 STRL STRAW (GLOVE) ×6 IMPLANT
GLOVE SURG SS PI 7.5 STRL IVOR (GLOVE) ×6 IMPLANT
GOWN STRL REUS W/ TWL LRG LVL3 (GOWN DISPOSABLE) ×2 IMPLANT
GOWN STRL REUS W/ TWL XL LVL3 (GOWN DISPOSABLE) ×1 IMPLANT
GOWN STRL REUS W/TWL LRG LVL3 (GOWN DISPOSABLE) ×4
GOWN STRL REUS W/TWL XL LVL3 (GOWN DISPOSABLE) ×2
GRAFT BALLN CATH 65CM (STENTS) IMPLANT
GRAFT FEN DIST EVAR 12X28X76 (Graft) ×3 IMPLANT
GRAFT FEN PROX EVAR 30X94M (Graft) ×3 IMPLANT
GRAFT LEG ILIAC ZSLE-16-74-ZT (Endovascular Graft) ×3 IMPLANT
GRAFT LEG ILIAC ZSLE-16-90-ZT (Endovascular Graft) ×3 IMPLANT
GUIDEWIRE ANGLED .035X150CM (WIRE) ×3 IMPLANT
HEMOSTAT SNOW SURGICEL 2X4 (HEMOSTASIS) IMPLANT
KIT BASIN OR (CUSTOM PROCEDURE TRAY) ×3 IMPLANT
KIT ENCORE 26 ADVANTAGE (KITS) ×3 IMPLANT
KIT ROOM TURNOVER OR (KITS) ×3 IMPLANT
NEEDLE PERC 18GX7CM (NEEDLE) ×6 IMPLANT
NS IRRIG 1000ML POUR BTL (IV SOLUTION) ×3 IMPLANT
PACK ENDOVASCULAR (PACKS) ×3 IMPLANT
PAD ARMBOARD 7.5X6 YLW CONV (MISCELLANEOUS) ×6 IMPLANT
PENCIL BUTTON HOLSTER BLD 10FT (ELECTRODE) ×3 IMPLANT
PERCLOSE PROGLIDE 6F (VASCULAR PRODUCTS) ×6
SET MICROPUNCTURE 5F STIFF (MISCELLANEOUS) ×3 IMPLANT
SHEATH AVANTI 11CM 8FR (MISCELLANEOUS) ×6 IMPLANT
SHEATH DRYSEAL FLEX 18FR 33CM (SHEATH) ×1 IMPLANT
SHEATH GUIDING 7F 55X73X9MM TD (SHEATH) ×3 IMPLANT
SHEATH HIGHFLEX ANSEL 6FRX55 (SHEATH) ×3 IMPLANT
SHEATH PINNACLE 5FR (SHEATH) ×3 IMPLANT
SHEATH SHUTTLE 7FR (SHEATH) ×3 IMPLANT
SHIELD RADPAD SCOOP 12X17 (MISCELLANEOUS) ×12 IMPLANT
SPONGE GAUZE 2X2 STER 10/PKG (GAUZE/BANDAGES/DRESSINGS) ×6
STENT GRAFT BALLN CATH 65CM (STENTS)
STENT VIABAHN 7X19X135 VBX (Permanent Stent) ×2 IMPLANT
STOPCOCK MORSE 400PSI 3WAY (MISCELLANEOUS) ×3 IMPLANT
SUT ETHILON 3 0 PS 1 (SUTURE) IMPLANT
SUT PROLENE 5 0 C 1 24 (SUTURE) IMPLANT
SUT VIC AB 2-0 CT1 27 (SUTURE)
SUT VIC AB 2-0 CT1 TAPERPNT 27 (SUTURE) IMPLANT
SUT VIC AB 3-0 SH 27 (SUTURE)
SUT VIC AB 3-0 SH 27X BRD (SUTURE) IMPLANT
SUT VICRYL 4-0 PS2 18IN ABS (SUTURE) ×6 IMPLANT
SYR 30ML LL (SYRINGE) ×3 IMPLANT
TRAY FOLEY W/METER SILVER 16FR (SET/KITS/TRAYS/PACK) ×3 IMPLANT
TUBING HIGH PRESSURE 120CM (CONNECTOR) ×6 IMPLANT
VANSCHIE 4 5FR RDC 65CM (CATHETERS) ×3
WIRE AMPLATZ SS-J .035X180CM (WIRE) IMPLANT
WIRE BENTSON .035X145CM (WIRE) ×6 IMPLANT
WIRE HI TORQ VERSACORE J 260CM (WIRE) ×3 IMPLANT
WIRE ROSEN-J .035X260CM (WIRE) ×9 IMPLANT
WIRE SPARTACORE .014X190CM (WIRE) ×6 IMPLANT
WIRE STIFF LUNDERQUIST 260MM (WIRE) ×6 IMPLANT
YANKAUER SUCT BULB TIP NO VENT (SUCTIONS) ×3 IMPLANT

## 2016-04-09 NOTE — Brief Op Note (Signed)
04/09/2016  8:50 PM  PATIENT:  Kevin Frank  75 y.o. 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  PROCEDURE:  Procedure(s): ABDOMINAL AORTIC ENDOVASCULAR FENESTRATED STENT GRAFT with left brachial artery access using ultrasound guidance (N/A)  SURGEON:  Surgeon(s) and Role:    Serafina Mitchell, MD - Primary  PHYSICIAN ASSISTANT:   ASSISTANTS: Silva Bandy   ANESTHESIA:   general  EBL:  Total I/O In: 100 [I.V.:100] Out: -   BLOOD ADMINISTERED:none  DRAINS: none   LOCAL MEDICATIONS USED:  NONE  SPECIMEN:  No Specimen  DISPOSITION OF SPECIMEN:  N/A  COUNTS:  YES  TOURNIQUET:  * No tourniquets in log *  DICTATION: .Dragon Dictation  PLAN OF CARE: Admit to inpatient   PATIENT DISPOSITION:  PACU - hemodynamically stable.   Delay start of Pharmacological VTE agent (>24hrs) due to surgical blood loss or risk of bleeding: yes

## 2016-04-09 NOTE — Op Note (Addendum)
Patient name: JOSEHUA HAMMAR MRN: [id number] DOB: 1941/03/01 Sex: male  04/09/2016 Pre-operative Diagnosis: Juxtarenal aortic aneurysm Post-operative diagnosis:  Same Surgeon:  Annamarie Major Assistants:  Silva Bandy Procedure:    #1:  U/s guided bilateral common femoral artery access   #2:  Fenestrated repair of Juxtarenal aortic aneurysm with single right renal fenestration   #3:  Distal extension x 2   #4:  right renal artery stent   #5:  Abdominal aortogram   #6:  U/s guided left brachial artery access   #7:  3rd order catheterization ( Left renal artery from left brachial artery)   #8:  Angioplasty left renal artery   #9:  bilateral renal artery angiogram Anesthesia:  General Blood Loss:  See anesthesia record Specimens:  none  Findings:  Complete exclusion.  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  We discussed options for repair and felt he was a good candidate for fenestrated endovascular stent graft.  The risks and benefits of the procedure were discussed with the patient and his family and he wished to proceed.  Devices used:  [employer] P2-30-94, D12-28-76, R ZSLE 16x90, L ZSLE 16x74.  VBX 7x29  Procedure:  The patient was identified in the holding area and taken to [place] [facility]  The patient was then placed supine on the table. general anesthesia was administered.  The patient was prepped and draped in the usual sterile fashion.  A time out was called and antibiotics were administered.  Ultrasound was used to evaluate bilateral common femoral arteries.  They were widely patent without significant calcification.  A #11 blade was used to make a skin nick bilaterally.  Bilateral common femoral arteries were  then cannulated under ultrasound guidance with an 18-gauge needle.  An 035 wires were advanced without resistance and an 8 French sheath was placed bilaterally.  The patient was fully heparinized.  Heparin levels were checked with ACT levels throughout the operation.IGUIDE software was used to mark the renal arteries and supra mesenteric artery for over leg and itching.  A Lunderquist wire was advanced up the left side.  The proximal device was prepared on the back table.  This was a Stormy Fabian 2-30-94 device.  It was oriented properly and advanced up the wire on the left side.  A Omni flush catheter was advanced up the right followed by a abdominal aortogram locating the renal arteries.  I then placed a 12 French dry seal sheath up the right side.  I did not like the way that the device was oriented with regards to the angulation at the aortic neck.  In order to prevent occlusion of the left renal artery which was not anticipated to be cannulated, I cannulated the left renal artery so I had wire access.  I then deployed the proximal device.  The 12 French sheath was then advanced into the main body.  At this point I was very worried about possible coverage of the left renal artery and felt that it needed to be protected.  I could not advance a wire a high and the proximal piece and therefore felt I needed to get brachial artery access in order to protect the left  renal artery.  The left forearm was then prepped and draped.  I evaluated the left brachial artery.  This was a healthy artery measuring approximate 6 mm.  The left brachial artery was then cannulated under ultrasound guidance with a micropuncture needle.  An 018 wire was advanced followed by a micropuncture sheath.  A 5 French sheath was then placed.  Using Omni flush catheter I advanced a Benson wire into the descending thoracic aorta.  I then selected a 7 French 90 cm sheath and advanced this over the wire down to the visceral aorta.  Using a Berenstein  catheter, the left renal artery was cannulated.  I advanced a Rosen wire out into the left renal artery so that I had access.  I then prepared with cannulation of the fenestration.  A 7 French steerable sheath was advanced through the 12 [place] dry seal.  Using a quick cross catheter, a Berenstein catheter and a Glidewire, I was able to select the right renal fenestration and gain access into the right renal artery.  A Rosen wire was placed.  I then advanced the steerable sheath out into the right renal artery.  A Gore 7 x 29 VBX stent was then advanced out the sheath.  I then selected an 8 x 20 balloon and advanced this through the brachial artery access into the left renal artery.  I had this sticking out into the aorta and then I deployed the suprarenal fixation.  The device slid down along the balloon and landed just below the origin of the left renal artery.  The proximal main body was then molded with a 32 coated balloon.  I then deployed the right renal artery stent.  8 10 x 20 balloon was used to flare the renal stent inferiorly and superiorly.  The balloon was let down and the left renal artery and would drawn.  The delivery system for the proximal main body was removed and replaced with the sheath for the distal main body which was a [facility] D-12-20 8-76.  This was then deployed down to the contralateral gate.  Contralateral gate was then cannulated with a Omni flush catheter and a Glidewire.  A Lunderquist wire was then inserted followed by the coated balloon which I inflated within the contralateral gate.  It mushroomed appropriately to confirm successful cannulation of the gate.  The constraining wire on the proximal distal piece was then released.  The image detector was rotated to a left anterior oblique position and a retrograde injection was performed through the sheath in the left groin locating the right hypogastric artery.  A distal extension on the right was a fair on the back table and deployed  landing just proximal to the right hypogastric artery.  This was a Cook  ZSLE 16 x 90 device . Next a retrograde injection was performed through the left groin locating the right hypogastric artery and a RAO position.  The left extension was performed on the back table.  This was a CookZSLE device.  It was a 16 x 74 device.  It was deployed landing just proximal to the left hypogastric artery.  Next the coated balloon was used to mold the proximal and distal attachment sites as well as device overlap, making sure not to injure the fenestration.  Completion imaging was then performed which showed successful exclusion of aneurysm with patency of both renal arteries and both hypogastric arteries.  At this point I was satisfied with the repair.  The delivery systems were  removed and the arteriotomies were closed by securing the pro-glide devices.  Hemostasis was satisfactory.  The heparin was reversed with protamine.  Skin incisions were closed with 4-0 Vicryl and Dermabond.  There were no immediate complications.  The patient had excellent Doppler signals in his feet.  He was successfully extubated and taken to recovery in stable condition.   Disposition:  To PACU in stable condition.   Theotis Burrow, M.D. [facility] [place] Office: [phone] Pager:  [phone]

## 2016-04-09 NOTE — Anesthesia Postprocedure Evaluation (Signed)
Anesthesia Post Note  Patient: Kevin Frank  Procedure(s) Performed: Procedure(s) (LRB): ABDOMINAL AORTIC ENDOVASCULAR FENESTRATED STENT GRAFT with left brachial artery access using ultrasound guidance (N/A)  Patient location during evaluation: PACU Anesthesia Type: General Level of consciousness: awake and alert Pain management: pain level controlled Vital Signs Assessment: post-procedure vital signs reviewed and stable Respiratory status: spontaneous breathing, nonlabored ventilation, respiratory function stable and patient connected to nasal cannula oxygen Cardiovascular status: blood pressure returned to baseline and stable Postop Assessment: no signs of nausea or vomiting Anesthetic complications: no    Last Vitals:  Vitals:   04/09/16 1600 04/09/16 1633  BP:  (!) 175/71  Pulse: 75   Resp: (!) 22   Temp:      Last Pain:  Vitals:   04/09/16 0642  TempSrc: Oral                 Kevin Frank

## 2016-04-09 NOTE — Transfer of Care (Signed)
Immediate Anesthesia Transfer of Care Note  Patient: Kevin Frank  Procedure(s) Performed: Procedure(s): ABDOMINAL AORTIC ENDOVASCULAR FENESTRATED STENT GRAFT with left brachial artery access using ultrasound guidance (N/A)  Patient Location: PACU  Anesthesia Type:General  Level of Consciousness: lethargic and responds to stimulation  Airway & Oxygen Therapy: Patient Spontanous Breathing and Patient connected to face mask oxygen  Post-op Assessment: Report given to RN and Patient moving all extremities X 4  Post vital signs: Reviewed and stable  Last Vitals:  Vitals:   04/09/16 0642  BP: (!) 168/82  Pulse: (!) 54  Resp: 18  Temp: 36.7 C    Last Pain:  Vitals:   04/09/16 0642  TempSrc: Oral         Complications: No apparent anesthesia complications

## 2016-04-09 NOTE — Progress Notes (Addendum)
  Day of Surgery Note    Subjective:  Agitated but denies pain   Vitals:   04/09/16 1545 04/09/16 1600  BP:    Pulse: 68 75  Resp: (!) 22 (!) 22  Temp:      Incisions:   Bilateral groins are soft without hematoma Extremities:  Easily palpable bilateral DP pulses and left radial pulse. Cardiac:  regular Lungs:  Non labored Abdomen:  Soft, NT/ND   Assessment/Plan:  This is a 75 y.o. male who is s/p fenestrated AAA endograft repair  -pt in pacu with palpable bilateral DP pulses and left radial pulse -hypertensive-prn ordered -otherwise doing well besides agitation.  Gentle leg restraints placed.   -to 2 south when bed available -pt for renal and mesenteric duplex tomorrow.  I have spoken to the vascular lab.   [name], PA-C 04/09/2016 4:24 PM [phone]   Stable, s/p FEVAR Mild groin pain. Palpable left radial pulse Will need Plavix  Wells Brabham

## 2016-04-09 NOTE — Anesthesia Procedure Notes (Signed)
Procedure Name: Intubation Date/Time: 04/09/2016 8:55 AM Performed by: Tressia Miners LEFFEW Pre-anesthesia Checklist: Patient identified, Patient being monitored, Timeout performed, Emergency Drugs available and Suction available Patient Re-evaluated:Patient Re-evaluated prior to inductionOxygen Delivery Method: Circle System Utilized Preoxygenation: Pre-oxygenation with 100% oxygen Intubation Type: IV induction Ventilation: Mask ventilation without difficulty and Oral airway inserted - appropriate to patient size Laryngoscope Size: Glidescope and 4 Grade View: Grade II Tube type: Oral Tube size: 7.5 mm Number of attempts: 1 Airway Equipment and Method: Video-laryngoscopy Placement Confirmation: ETT inserted through vocal cords under direct vision,  positive ETCO2 and breath sounds checked- equal and bilateral Secured at: 23 cm Tube secured with: Tape Dental Injury: Teeth and Oropharynx as per pre-operative assessment

## 2016-04-09 NOTE — Anesthesia Preprocedure Evaluation (Signed)
Anesthesia Evaluation  Patient identified by MRN, date of birth, ID band Patient awake    Reviewed: Allergy & Precautions, H&P , NPO status , Patient's Chart, lab work & pertinent test results, reviewed documented beta blocker date and time   History of Anesthesia Complications Negative for: history of anesthetic complications  Airway Mallampati: I   Neck ROM: Full    Dental  (+) Teeth Intact   Pulmonary shortness of breath, sleep apnea , COPD,  COPD inhaler, Current Smoker,    + rhonchi        Cardiovascular hypertension, + Peripheral Vascular Disease (AAA)   Rhythm:Regular Rate:Normal     Neuro/Psych PSYCHIATRIC DISORDERS Depression    GI/Hepatic   Endo/Other  diabetes, Well Controlled, Type 2  Renal/GU      Musculoskeletal   Abdominal   Peds  Hematology   Anesthesia Other Findings   Reproductive/Obstetrics                             Anesthesia Physical  Anesthesia Plan  ASA: III  Anesthesia Plan: General   Post-op Pain Management:    Induction: Intravenous  Airway Management Planned: Oral ETT  Additional Equipment: Arterial line  Intra-op Plan:   Post-operative Plan: Possible Post-op intubation/ventilation  Informed Consent: I have reviewed the patients History and Physical, chart, labs and discussed the procedure including the risks, benefits and alternatives for the proposed anesthesia with the patient or authorized representative who has indicated his/her understanding and acceptance.     Plan Discussed with: Anesthesiologist, CRNA and Surgeon  Anesthesia Plan Comments: (Plan for GA, right arm A line with 2 PIVs)        Anesthesia Quick Evaluation

## 2016-04-09 NOTE — H&P (Signed)
[facility] [place]  Patient name: Kevin Frank        MRN: [id number]        DOB: [birth year]          Sex: male  REASON FOR VISIT: Follow-up abdominal aortic aneurysm  HPI: Kevin Frank is a 75 y.o. male returns today for follow-up of his abdominal aortic aneurysm which was first detected by MRI in 2008 when he presented with back pain.  We had difficulty seeing through bowel gas on ultrasound and therefore he has a CT scan today.  The patient continues to be medically managed for diabetes.  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  He is on a statin for hypercholesterolemia.  He is medically managed for hypertension.      Past Medical History:  Diagnosis Date  . AAA (abdominal aortic aneurysm) ([place]) 01/2008   3.2cm  . BPH (benign prostatic hyperplasia)   . COPD (chronic obstructive pulmonary disease) ([place])   . Diabetes mellitus    type II  . Gout   . Hypercholesterolemia   . Hyperlipidemia   . Hypertension   . Normal nuclear stress test 2011  . Obesity   . Obstructive sleep apnea on CPAP   . Presence of indwelling urinary catheter   . Shortness of breath    due to cigarette abuse  . Tobacco abuse           Family History  Problem Relation Age of Onset  . Liver disease Father   . Lymphoma Mother   . Diabetes Mother   . Hypertension Mother   . Cancer Mother   . Mental retardation      sibling -- from birth     SOCIAL HISTORY:        Social History  Substance Use Topics  . Smoking status: Current Every Day Smoker    Packs/day: 2.00    Years: 50.00    Types: Cigarettes  . Smokeless tobacco: Never Used  . Alcohol use 0.0 oz/week      Comment: occasional    No Known Allergies        Current Outpatient Prescriptions  Medication Sig Dispense Refill  . albuterol (PROVENTIL HFA;VENTOLIN HFA) 108 (90 BASE) MCG/ACT inhaler Inhale 2 puffs into the  lungs every 6 (six) hours as needed.    [name] allopurinol (ZYLOPRIM) 300 MG tablet Take 300 mg by mouth daily.      [name] amLODipine (NORVASC) 10 MG tablet Take 1 tablet (10 mg total) by mouth daily. 90 tablet 2  . atorvastatin (LIPITOR) 20 MG tablet     . azelastine (ASTELIN) 0.1 % nasal spray Use 1-2 sprays in each nostril as directed each morning and evening 90 mL 1  . budesonide-formoterol (SYMBICORT) 160-4.5 MCG/ACT inhaler Inhale 2 puffs into the lungs 2 (two) times daily. 3 Inhaler 1  . furosemide (LASIX) 40 MG tablet Take 40 mg by mouth daily.      [name] losartan (COZAAR) 50 MG tablet Take 1 tablet (50 mg total) by mouth daily. 90 tablet 3  . metFORMIN (GLUCOPHAGE) 500 MG tablet Take 500 mg by mouth 2 (two) times daily with a meal.      . metoprolol succinate (TOPROL-XL) 25 MG 24 hr tablet Take 1 tablet (25 mg total) by mouth daily. 90 tablet 2  . Olopatadine HCl 0.6 % SOLN USE 1-2 SPRAYS IN EACH NOSTRIL EACH MORNING AND EVENING. 1 Bottle 5  . sertraline (ZOLOFT)  50 MG tablet Take 50 mg by mouth daily.      [name] SPIRIVA HANDIHALER 18 MCG inhalation capsule Place 18 mcg into inhaler and inhale as needed. For congestion/breathing              Current Facility-Administered Medications  Medication Dose Route Frequency Provider Last Rate Last Dose  . ipratropium (ATROVENT) nebulizer solution 0.5 mg  0.5 mg Nebulization Once Roselyn Malachy Moan, MD      . levalbuterol Penne Lash) nebulizer solution 1.25 mg  1.25 mg Nebulization Once Roselyn Malachy Moan, MD        REVIEW OF SYSTEMS:  '[X]'$  denotes positive finding, '[ ]'$  denotes negative finding Cardiac  Comments:  Chest pain or chest pressure:    Shortness of breath upon exertion:    Short of breath when lying flat:    Irregular heart rhythm:        Vascular    Pain in calf, thigh, or hip brought on by ambulation:    Pain in feet at night that wakes you up from your sleep:     Blood clot in your veins:    Leg swelling:          Pulmonary    Oxygen at home:    Productive cough:  x   Wheezing:         Neurologic    Sudden weakness in arms or legs:     Sudden numbness in arms or legs:     Sudden onset of difficulty speaking or slurred speech:    Temporary loss of vision in one eye:     Problems with dizziness:         Gastrointestinal    Blood in stool:     Vomited blood:         Genitourinary    Burning when urinating:     Blood in urine:        Psychiatric    Major depression:         Hematologic    Bleeding problems:    Problems with blood clotting too easily:        Skin    Rashes or ulcers:        Constitutional    Fever or chills:      PHYSICAL EXAM:     Vitals:   01/21/16 1506 01/21/16 1508  BP: (!) 153/79 (!) 145/80  Pulse: 64   Resp: 18   SpO2: 92%   Weight: 224 lb (101.6 kg)   Height: 5' 5.5" (1.664 m)     GENERAL: The patient is a well-nourished male, in no acute distress. The vital signs are documented above. CARDIAC: There is a regular rate and rhythm.  PULMONARY: There is good air exchange bilaterally without wheezing or rales. ABDOMEN: Soft and non-tender with normal pitched bowel sounds.  MUSCULOSKELETAL: There are no major deformities or cyanosis. NEUROLOGIC: No focal weakness or paresthesias are detected. SKIN: There are no ulcers or rashes noted. PSYCHIATRIC: The patient has a normal affect.  DATA:  I have reviewed his CT scan which shows a juxtarenal 4.9-5.0 cm aneurysm.  MEDICAL ISSUES: I discussed with the patient and his wife of 18 years that his aneurysm is getting to the point where we need to consider repair.  By my measurement it is just over 5 cm.  I do not think that he has adequate infrarenal neck for a standard stent graft it would need to consider fenestrated repair.  I will need  to get this further evaluated on Tera Recon.  I'm going to contact him once I have  seen whether or not he is a candidate for a fenestrated repair.  If he is, he will need to get carotid Doppler studies, arterial evaluation of his popliteal arteries as well as baseline ankle-brachial indices.  I will also get cardiac clearance from Dr. Burt Knack.  He did have a CT scan with an indeterminate lung nodule which will need to be further evaluated.  (H) [phone]    Annamarie Major, MD [facility] [phone] Pager [phone]  No interval changes CV RRR Pulm CTA Abd sift  Plan FEVAR Risks and benefits   Wells Chinonso Linker

## 2016-04-10 ENCOUNTER — Encounter (HOSPITAL_COMMUNITY)
Admission: RE | Disposition: A | Discharge status: Home Health | Payer: Self-pay | Source: Ambulatory Visit | Attending: Surgery

## 2016-04-10 ENCOUNTER — Inpatient Hospital Stay (HOSPITAL_COMMUNITY): Payer: Commercial Managed Care - HMO

## 2016-04-10 DIAGNOSIS — I714 Abdominal aortic aneurysm, without rupture: Secondary | ICD-10-CM

## 2016-04-10 DIAGNOSIS — R29898 Other symptoms and signs involving the musculoskeletal system: Secondary | ICD-10-CM

## 2016-04-10 HISTORY — PX: PERIPHERAL VASCULAR CATHETERIZATION: SHX172C

## 2016-04-10 LAB — GLUCOSE, CAPILLARY
GLUCOSE-CAPILLARY: 129 mg/dL — AB (ref 65–99)
Glucose-Capillary: 148 mg/dL — ABNORMAL HIGH (ref 65–99)
Glucose-Capillary: 137 mg/dL — ABNORMAL HIGH (ref 65–99)
Glucose-Capillary: 133 mg/dL — ABNORMAL HIGH (ref 65–99)

## 2016-04-10 LAB — CBC
HCT: 42.2 % (ref 39.0–52.0)
HEMOGLOBIN: 14.3 g/dL (ref 13.0–17.0)
MCH: 32.4 pg (ref 26.0–34.0)
MCHC: 33.9 g/dL (ref 30.0–36.0)
MCV: 95.7 fL (ref 78.0–100.0)
PLATELETS: 131 10*3/uL — AB (ref 150–400)
RBC: 4.41 MIL/uL (ref 4.22–5.81)
RDW: 14.2 % (ref 11.5–15.5)
WBC: 11.8 10*3/uL — ABNORMAL HIGH (ref 4.0–10.5)

## 2016-04-10 LAB — BASIC METABOLIC PANEL
Anion gap: 6 (ref 5–15)
BUN: 15 mg/dL (ref 6–20)
CHLORIDE: 107 mmol/L (ref 101–111)
CO2: 25 mmol/L (ref 22–32)
Calcium: 8.1 mg/dL — ABNORMAL LOW (ref 8.9–10.3)
Creatinine, Ser: 1.83 mg/dL — ABNORMAL HIGH (ref 0.61–1.24)
GFR calc Af Amer: 40 mL/min — ABNORMAL LOW (ref >60)
GFR calc non Af Amer: 34 mL/min — ABNORMAL LOW (ref >60)
GLUCOSE: 130 mg/dL — AB (ref 65–99)
POTASSIUM: 3.6 mmol/L (ref 3.5–5.1)
Sodium: 138 mmol/L (ref 135–145)

## 2016-04-10 LAB — MRSA PCR SCREENING: MRSA by PCR: NEGATIVE

## 2016-04-10 LAB — POCT ACTIVATED CLOTTING TIME: ACTIVATED CLOTTING TIME: 153 s

## 2016-04-10 SURGERY — RENAL ANGIOGRAPHY
Anesthesia: LOCAL

## 2016-04-10 MED ORDER — INSULIN ASPART 100 UNIT/ML ~~LOC~~ SOLN
0.0000 [IU] | Freq: Three times a day (TID) | SUBCUTANEOUS | Status: DC
  Administered 2016-04-10 (×3): 2 [IU] via SUBCUTANEOUS
  Administered 2016-04-11: 5 [IU] via SUBCUTANEOUS
  Administered 2016-04-11 (×2): 2 [IU] via SUBCUTANEOUS
  Administered 2016-04-12 – 2016-04-13 (×4): 3 [IU] via SUBCUTANEOUS
  Administered 2016-04-13: 2 [IU] via SUBCUTANEOUS
  Administered 2016-04-13: 3 [IU] via SUBCUTANEOUS
  Administered 2016-04-14: 2 [IU] via SUBCUTANEOUS

## 2016-04-10 MED ORDER — MIDAZOLAM HCL 2 MG/2ML IJ SOLN
INTRAMUSCULAR | Status: DC | PRN
Start: 2016-04-10 — End: 2016-04-10
  Administered 2016-04-10: 1 mg via INTRAVENOUS

## 2016-04-10 MED ORDER — MIDAZOLAM HCL 2 MG/2ML IJ SOLN
INTRAMUSCULAR | Status: AC
  Filled 2016-04-10: qty 2

## 2016-04-10 MED ORDER — FENTANYL CITRATE (PF) 100 MCG/2ML IJ SOLN
INTRAMUSCULAR | Status: DC | PRN
  Administered 2016-04-10: 50 ug via INTRAVENOUS

## 2016-04-10 MED ORDER — LIDOCAINE HCL (PF) 1 % IJ SOLN
INTRAMUSCULAR | Status: DC | PRN
  Administered 2016-04-10: 5 mL

## 2016-04-10 MED ORDER — LIDOCAINE HCL (PF) 1 % IJ SOLN
INTRAMUSCULAR | Status: AC
Start: 2016-04-10 — End: 2016-04-10
  Filled 2016-04-10: qty 30

## 2016-04-10 MED ORDER — ONDANSETRON HCL 4 MG/2ML IJ SOLN
INTRAMUSCULAR | Status: DC | PRN
  Administered 2016-04-10: 4 mg via INTRAVENOUS

## 2016-04-10 MED ORDER — HEPARIN SODIUM (PORCINE) 1000 UNIT/ML IJ SOLN
INTRAMUSCULAR | Status: AC
Start: 2016-04-10 — End: 2016-04-10
  Filled 2016-04-10: qty 1

## 2016-04-10 MED ORDER — ONDANSETRON HCL 4 MG/2ML IJ SOLN
INTRAMUSCULAR | Status: AC
Start: 2016-04-10 — End: 2016-04-10
  Filled 2016-04-10: qty 2

## 2016-04-10 MED ORDER — ACETAMINOPHEN 325 MG PO TABS: 650.0000 mg | ORAL_TABLET | ORAL | Status: DC | PRN

## 2016-04-10 MED ORDER — ONDANSETRON HCL 4 MG/2ML IJ SOLN: 4.0000 mg | Freq: Four times a day (QID) | INTRAMUSCULAR | Status: DC | PRN

## 2016-04-10 MED ORDER — NICOTINE 14 MG/24HR TD PT24
14.0000 mg | MEDICATED_PATCH | Freq: Every day | TRANSDERMAL | Status: DC
  Administered 2016-04-10 – 2016-04-12 (×3): 14 mg via TRANSDERMAL
  Filled 2016-04-10 (×5): qty 1

## 2016-04-10 MED ORDER — HEPARIN SODIUM (PORCINE) 1000 UNIT/ML IJ SOLN
INTRAMUSCULAR | Status: DC | PRN
  Administered 2016-04-10: 2000 [IU] via INTRAVENOUS
  Administered 2016-04-10: 6000 [IU] via INTRAVENOUS

## 2016-04-10 MED ORDER — FENTANYL CITRATE (PF) 100 MCG/2ML IJ SOLN
INTRAMUSCULAR | Status: AC
  Filled 2016-04-10: qty 2

## 2016-04-10 MED ORDER — NITROGLYCERIN 1 MG/10 ML FOR IR/CATH LAB
INTRA_ARTERIAL | Status: AC
  Filled 2016-04-10: qty 10

## 2016-04-10 MED ORDER — HEPARIN (PORCINE) IN NACL 2-0.9 UNIT/ML-% IJ SOLN
INTRAMUSCULAR | Status: AC
  Filled 2016-04-10: qty 1000

## 2016-04-10 MED ORDER — ASPIRIN 325 MG PO TABS
325.0000 mg | ORAL_TABLET | Freq: Every day | ORAL | Status: DC
  Administered 2016-04-11 – 2016-04-14 (×4): 325 mg via ORAL
  Filled 2016-04-10 (×5): qty 1

## 2016-04-10 MED ORDER — NITROGLYCERIN 1 MG/10 ML FOR IR/CATH LAB
INTRA_ARTERIAL | Status: DC | PRN
  Administered 2016-04-10: 200 ug via INTRA_ARTERIAL

## 2016-04-10 MED ORDER — ENOXAPARIN SODIUM 30 MG/0.3ML ~~LOC~~ SOLN
30.0000 mg | SUBCUTANEOUS | Status: DC
  Administered 2016-04-11 – 2016-04-14 (×4): 30 mg via SUBCUTANEOUS
  Filled 2016-04-10 (×4): qty 0.3

## 2016-04-10 SURGICAL SUPPLY — 19 items
BALLN VIATRAC 7X15X135 (BALLOONS) ×2
BALLOON VIATRAC 7X15X135 (BALLOONS) ×1 IMPLANT
CATH ANGIO 5F PIGTAIL 100CM (CATHETERS) ×2 IMPLANT
FILTER CO2 0.2 MICRON (VASCULAR PRODUCTS) ×2 IMPLANT
GUIDE CATH RUNWAY 6FR LCB (CATHETERS) ×2 IMPLANT
KIT ENCORE 26 ADVANTAGE (KITS) ×2 IMPLANT
KIT MICROINTRODUCER STIFF 5F (SHEATH) ×2 IMPLANT
KIT PV (KITS) ×2 IMPLANT
RESERVOIR CO2 (VASCULAR PRODUCTS) ×2 IMPLANT
SET FLUSH CO2 (MISCELLANEOUS) ×2 IMPLANT
SHEATH PINNACLE 6F 10CM (SHEATH) ×2 IMPLANT
STENT HERCULINK RX 6.0X15X135 (Permanent Stent) ×2 IMPLANT
SYR MEDRAD MARK V 150ML (SYRINGE) ×2 IMPLANT
TRANSDUCER W/STOPCOCK (MISCELLANEOUS) ×2 IMPLANT
TRAY PV CATH (CUSTOM PROCEDURE TRAY) ×2 IMPLANT
WIRE BENTSON .035X145CM (WIRE) ×2 IMPLANT
WIRE HITORQ VERSACORE ST 145CM (WIRE) ×2 IMPLANT
WIRE ROSEN-J .035X260CM (WIRE) ×2 IMPLANT
WIRE STABILIZER XS .014X180CM (WIRE) ×2 IMPLANT

## 2016-04-10 NOTE — Progress Notes (Signed)
Kevin Frank, [name] about transfer orders to 3S that are in from yesterday, gave verbal orders to not transfer patient and to keep them on 2S.   Rowe Pavy, RN

## 2016-04-10 NOTE — Op Note (Signed)
    NAME: Kevin Frank   MRN: [id number] DOB: Mar 22, 1941    DATE OF OPERATION: 04/10/2016  PREOP DIAGNOSIS: Poor blood flow to left renal artery  POSTOP DIAGNOSIS: Same  PROCEDURE:  1. Ultrasound-guided access to the left brachial artery 2. Second order catheterization of the ascending thoracic aorta 3. CO2 aortogram 4. Selective catheterization of the left renal artery with left renal arteriogram 5. Angioplasty and stenting of the left renal artery with a 6.0 x 15 mm Herculink stent  SURGEON: Judeth Cornfield. Scot Dock, MD, FACS  ANESTHESIA: Local with sedation   EBL: Minimal  INDICATIONS: Kevin Frank is a 75 y.o. male 1 and went fenestrated stent graft repair of a juxtarenal aneurysm yesterday. Duplex scan today showed poor flow in the left renal artery. CO2 arteriogram and possible renal stent was recommended.  TECHNIQUE: The patient was taken to the peripheral vascular lab and received 1 mg of Versed. He later received 50 g of fentanyl. His blood pressure and heart rate were monitored throughout the case by myself and the nurse. The left arm was prepped and draped in usual sterile fashion. Under ultrasound guidance, after the skin was anesthetized, the left brachial artery was cannulated with a micropuncture needle and micropuncture sheath introduced over the wire. This was exchanged for a 6 [place] sheath over a [employer] wire. I then advanced the [facility] wire into the aortic arch. Using the long pigtail catheter I was able to direct the wire down into the descending thoracic aorta and then the pigtail catheter was advanced into the proximal stent just above the renal arteries. CO2 arteriogram was obtained. Initially was significant motion artifact on the third injection were able to visualize the left renal artery. I then placed along Rosen wire. An exchanged the pigtail catheter for an LCB guide catheter. I was unable to cannulate the left renal artery with a 0.014 wire which was  positioned out in the main body of the left renal artery. 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 Patient was heparinized and ACT after the heparin circulated was 230. I selected a 6 mm x 15 mm Herculink stent. This was positioned into the left renal artery with about 3 mm of the stent extending out into the aorta. This was then deployed to 14 atm for 1 minute which would give it a diameter of 6.42 m. A completion film was obtained to the sheath after the stent was removed and there was excellent result with no residual stenosis. The wire and catheter were then removed. The patient was transferred to the holding area for removal of the sheath. No immediate consultations were noted.  FINDINGS:  1. 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 This was successfully addressed with balloon angioplasty and stenting as described above. 2. The right renal artery, superior mesenteric artery, celiac axis were patent by CO2 arteriogram.  Deitra Mayo, MD, FACS [facility]  DATE OF DICTATION:   04/10/2016

## 2016-04-10 NOTE — Progress Notes (Signed)
[facility] Progress Note  Subjective  - POD #1  Says he has no feeling in left leg and foot. Has pain in groins. Currently having some back pain. Denies prior history of back pain.   Objective Vitals:   04/10/16 0600 04/10/16 0700  BP: 131/66 (!) 144/64  Pulse: 66 61  Resp: (!) 21 19  Temp:      Intake/Output Summary (Last 24 hours) at 04/10/16 0800 Last data filed at 04/10/16 0700  Gross per 24 hour  Intake          3638.33 ml  Output             4505 ml  Net          -866.67 ml   Left antecubital space no hematoma Abdomen soft and nontender.  Bilateral groins soft without hematoma. Abdomen soft and nontender.  Palpable DP pulses bilaterally. No sensation left leg from below knee down to foot. Ankle flexion and extension intact. Barely moving his left toes.  Normal motor and sensory function remaining extremities.   Assessment/Planning: 75 y.o. male is s/p: fenestrated endovascular stent graft repair of juxtarenal abdominal aortic aneurysm with left brachial access  1 Day Post-Op   Pulm: on 4L [place], wean as tolerated  CV: BP stable, prn antihypertensives.   Renal: Urinary retention requiring foley last night. Creatinine has risen from 1.52 to 1.83. Continue IVF. Monitor.    Heme: Hgb stable.   Neuro: LLE numbness and weakness: says his left leg felt like it was "floating" yesterday post-op around 1600. Around 2200, he complained of not being able to feel the left leg. He has palpable pedal pulses bilaterally. Motor sensation intact to leg and ankle. Minimally moving toes. No history back pain. Currently having back pain, likely positional from surgery.  Dr. Oneida Alar came in to see patient and ordered CT head which was negative. Still without sensation to left leg below knee. Will ask neurology to see this am.   GI: Stress ulcer prophylaxis. No acute issues.   Endocrine: Hold metformin given contrast administration yest. SSI.   Remain NPO for renal and  mesenteric duplexes today.    Joelene Millin A Trinh 04/10/2016 8:00 AM --  Laboratory CBC    Component Value Date/Time   WBC 11.8 (H) 04/10/2016 0350   HGB 14.3 04/10/2016 0350   HCT 42.2 04/10/2016 0350   PLT 131 (L) 04/10/2016 0350    BMET    Component Value Date/Time   NA 138 04/10/2016 0350   K 3.6 04/10/2016 0350   CL 107 04/10/2016 0350   CO2 25 04/10/2016 0350   GLUCOSE 130 (H) 04/10/2016 0350   BUN 15 04/10/2016 0350   CREATININE 1.83 (H) 04/10/2016 0350   CREATININE 0.98 02/08/2016 1300   CALCIUM 8.1 (L) 04/10/2016 0350   GFRNONAA 34 (L) 04/10/2016 0350   GFRAA 40 (L) 04/10/2016 0350    COAG Lab Results  Component Value Date   INR 1.08 04/09/2016   INR 0.99 04/02/2016   No results found for: PTT  Antibiotics Anti-infectives    Start     Dose/Rate Route Frequency Ordered Stop   04/09/16 1545  cefUROXime (ZINACEF) 1.5 g in dextrose 5 % 50 mL IVPB     1.5 g 100 mL/hr over 30 Minutes Intravenous Every 12 hours 04/09/16 1543 04/10/16 2359   04/09/16 1310  cefUROXime (ZINACEF) 1.5 g in dextrose 5 % 50 mL IVPB  Status:  Discontinued     1.5  g 100 mL/hr over 30 Minutes Intravenous 30 min pre-op 04/09/16 1310 04/09/16 1543   04/09/16 0630  dextrose 5 % with cefUROXime (ZINACEF) ADS Med    Comments:  Debbe Bales, Meredit: cabinet override      04/09/16 0630 04/09/16 1844   04/09/16 0628  cefUROXime (ZINACEF) 1.5 g in dextrose 5 % 50 mL IVPB     1.5 g 100 mL/hr over 30 Minutes Intravenous 30 min pre-op 04/09/16 4540 04/09/16 1320       Virgina Jock, PA-C [facility] Office: [phone] Pager: [phone] 04/10/2016 8:00 AM   Agree with the above Decreased motor function and sensation in left leg.  He has palpable pedal pulses.  I suspect this is either from lumbar spine issues or possibly CVA.  Neurology will be consulted to further eval  Acute renal failure, likely secondary to contrast administration during OR.  Renal dopplers  pending this am.  Continue with IV hydration and holding nephrotoxic meds  Will start plavix in the immediate future   Wells Eymi Lipuma

## 2016-04-10 NOTE — Progress Notes (Signed)
VASCULAR SURGERY  This is a 75 year old gentleman with a juxtarenal abdominal aortic aneurysm. He underwent a fenestrated repair yesterday by Dr. Trula Slade. A duplex scan of the abdomen was obtained today which showed that there was good flow in the right renal artery however no flow could be visualized in the left renal artery. His creatinine has bumped to 1.83. After discussion with Dr. Trula Slade, we agreed that the best option will be to proceed with a CO2 aortogram via a left brachial approach to determine if the left renal artery is patent. If the left renal artery is completely occluded and then there would be no endovascular option. If the left renal arteries narrowed or there is triple flow we will attempt to place a left renal artery stents.  He has had some mild left flank pain which has been present for most of the day. He also notes some paresthesias in his left leg from the knee down and is being worked up for this.  I have discussed the situation with the patient and his family at the bedside. We reviewed the indications for the procedure and potential complications of the procedure including bleeding, arterial injury, or not being able to open the left renal artery if it is occluded. He is at slightly increased risk for arterial injury or bleeding problems given that we will use a brachial approach.  Deitra Mayo, MD, [place] [phone] Office: [phone]

## 2016-04-10 NOTE — Progress Notes (Signed)
*  PRELIMINARY RESULTS* Vascular Ultrasound Renal Artery Duplex has been completed. Study was technically limited and difficult due to patient body habitus and overlying bowel gas.  Right: The right intrarenal arteries are patent. Only the hilar right renal artery was visualized and is patent by color Doppler. Unable to visualize remaining segments of the right renal artery due to technical limitations.  Left: Unable to insonate any discernable waveform within the left kidney by color, power, and spectral Doppler. Unable to definitively identify flow in the left renal artery by color, power, and spectral Doppler.  Unable to definitively visualize the celiac artery and superior mesenteric artery by B-mode and color Doppler, however spectral Doppler in the area of these vessels suggest that these vessels may be patent with velocities of 245/32 (celiac area) and 205/27 (SMA area).  Preliminary results have been discussed with Dr. Trula Slade.  04/10/2016 11:55 AM Maudry Mayhew, BS, RVT, RDCS, RDMS

## 2016-04-10 NOTE — Progress Notes (Signed)
Placed patient on CPAP for the night with pressure set at 10cm. Oxygen set at 4lpm

## 2016-04-10 NOTE — Progress Notes (Signed)
Site area: Right groin a 6 french arterial brachial sheath was removed by Alison Murray RT [facility]  Site Prior to Removal:  Level 0  Pressure Applied For 20 MINUTES    Bedrest Beginning at 1650p  Manual:   Yes.    Patient Status During Pull:  stable  Post Pull Groin Site:  Level 0  Post Pull Instructions Given:  Yes.    Post Pull Pulses Present:  Yes.    Dressing Applied:  Yes.    Comments:  VS remain stable during sheath pull.

## 2016-04-10 NOTE — Progress Notes (Addendum)
Renal duplex just completed.  Poor flow to left kidney, which is the renal artery of concern yestrday in the OR.  In addition the patient has an increase in his creatinine.  I think he needs to undergo urgent renal angiography with CO2 and minimal dye to evaluate and possibly stent his left renal artery.  I discussed this with the patient's wife in detail.  I am not available, and so I have asked my partner Dr Scot Dock to do the procedure   Nita Sickle

## 2016-04-10 NOTE — Progress Notes (Signed)
Called to see pt for numbess in left leg.  Difficult to determine time course but this has been present for at least and hour and the patient thinks it may have been like this since the end of his operation about 4 pm. He cannot really pinpoint onset.  He had some urinary retention this evening but has improved with Foley but numbness persists.  He has no prior similar episodes and no history of back pain.  No significant hypotension.  PE:  Vitals:   04/09/16 2300 04/09/16 2341 04/10/16 0000 04/10/16 0005  BP: 135/71 (!) 142/68 136/68   Pulse: 77  67   Resp: (!) 21  19   Temp:    99.5 F (37.5 C)  TempSrc:    Oral  SpO2: 93%  94%   Weight:        Neuro: decreased sensation to sharp and light touch circumferentially below the knee left leg, has some subtle weakness and discoordination of left leg from the knee down 4/5 motor compared to right, hip flexion seems fairly similar 5/5 bilaterally  Alert and oriented x 3, upper extremities symmetric motor/sensory  Extremities: 2+ DP pulses bilaterally feet pink/warm  A: left leg numbness and possibly some weakness.  Unknown exact time of onset but present now for at least 2 hours without improvement.  Possible peripheral nerve event but less likely in the scenario of no back issues in past.  Most likely represents a central event possible stroke.  P: Will get head CT to rule out bleed/infarct.  May need neuro consult later today if no improvement of symptoms  Ruta Hinds, MD [facility] [place]: [phone] Pager: [phone]

## 2016-04-10 NOTE — Consult Note (Addendum)
NEURO HOSPITALIST CONSULT NOTE   Requestig physician: Dr. Trula Slade   Reason for Consult: left LE weakness post surgery   History obtained from:  Patient     HPI:                                                                                                                                          Kevin Frank is an 75 y.o. male status post abdominal aortic endovascular fenestrated stent graft with left brachial artery access. Status post surgery patient noted that he had difficulty lifting his left leg and his left leg had decreased sensation. Patient reports new onset numbness below the knee and difficulty moving his foot up and side to side. Also numbness below the knee which is improving. He denies low back pain and radiculopathy.    Past Medical History:  Diagnosis Date  . AAA (abdominal aortic aneurysm) ([place]) 01/2008   3.2cm  . BPH (benign prostatic hyperplasia)   . COPD (chronic obstructive pulmonary disease) ([place])   . Diabetes mellitus    type II  . Gout   . History of kidney stones   . Hypercholesterolemia   . Hyperlipidemia   . Hypertension   . Normal nuclear stress test 2011  . Obesity   . Obstructive sleep apnea on CPAP   . Presence of indwelling urinary catheter   . Shortness of breath    due to cigarette abuse  . Tobacco abuse     Past Surgical History:  Procedure Laterality Date  . CHOLECYSTECTOMY  1997   [place]  . TRANSURETHRAL RESECTION OF PROSTATE  01/07/2011   Procedure: TRANSURETHRAL RESECTION OF THE PROSTATE (TURP);  Surgeon: Marissa Nestle;  Location: [facility];  Service: Urology;  Laterality: N/A;    Family History  Problem Relation Age of Onset  . Liver disease Father   . Lymphoma Mother   . Diabetes Mother   . Hypertension Mother   . Cancer Mother   . Mental retardation      sibling -- from birth       Social History:  reports that he has been smoking Cigarettes.  He has a 100.00 pack-year smoking history. He has  never used smokeless tobacco. He reports that he drinks alcohol. He reports that he does not use drugs.  Allergies  Allergen Reactions  . No Known Allergies     MEDICATIONS:  Scheduled: . allopurinol  300 mg Oral Daily  . amLODipine  10 mg Oral Daily  . atorvastatin  20 mg Oral Daily  . azelastine  2 spray Each Nare BID  . cefUROXime (ZINACEF)  IV  1.5 g Intravenous Q12H  . docusate sodium  100 mg Oral Daily  . furosemide  40 mg Oral Daily  . heparin  5,000 Units Subcutaneous Q8H  . insulin aspart  0-15 Units Subcutaneous TID WC  . metoprolol succinate  25 mg Oral Daily  . mometasone-formoterol  2 puff Inhalation BID  . nicotine  14 mg Transdermal Daily  . pantoprazole  40 mg Oral Daily  . sertraline  50 mg Oral Daily  . tiotropium  18 mcg Inhalation Daily     ROS:                                                                                                                                       History obtained from the patient  General ROS: negative for - chills, fatigue, fever, night sweats, weight gain or weight loss Psychological ROS: negative for - behavioral disorder, hallucinations, memory difficulties, mood swings or suicidal ideation Ophthalmic ROS: negative for - blurry vision, double vision, eye pain or loss of vision ENT ROS: negative for - epistaxis, nasal discharge, oral lesions, sore throat, tinnitus or vertigo Allergy and Immunology ROS: negative for - hives or itchy/watery eyes Hematological and Lymphatic ROS: negative for - bleeding problems, bruising or swollen lymph nodes Endocrine ROS: negative for - galactorrhea, hair pattern changes, polydipsia/polyuria or temperature intolerance Respiratory ROS: negative for - cough, hemoptysis, shortness of breath or wheezing Cardiovascular ROS: negative for - chest pain, dyspnea on exertion, edema or  irregular heartbeat Gastrointestinal ROS: negative for - abdominal pain, diarrhea, hematemesis, nausea/vomiting or stool incontinence Genito-Urinary ROS: negative for - dysuria, hematuria, incontinence or urinary frequency/urgency Musculoskeletal ROS: negative for - joint swelling or muscular weakness Neurological ROS: as noted in HPI Dermatological ROS: negative for rash and skin lesion changes   Blood pressure (!) 148/72, pulse 67, temperature 99.3 F (37.4 C), temperature source Oral, resp. rate 20, weight 101.6 kg (224 lb), SpO2 95 %.   Neurologic Examination:                                                                                                      HEENT-  Normocephalic, no lesions, without obvious abnormality.  Normal external eye and conjunctiva.  Normal TM's bilaterally.  Normal auditory  canals and external ears. Normal external nose, mucus membranes and septum.  Normal pharynx. Cardiovascular- S1, S2 normal, pulses palpable throughout   Lungs- chest clear, no wheezing, rales, normal symmetric air entry Abdomen- normal findings: bowel sounds normal Extremities- no edema Lymph-no adenopathy palpable Musculoskeletal-no joint tenderness, deformity or swelling Skin-warm and dry, no hyperpigmentation, vitiligo, or suspicious lesions  Neurological Examination Mental Status: Alert, oriented.  Speech fluent without evidence of aphasia.  Able to follow 3 step commands without difficulty. Cranial Nerves: II:  Visual fields grossly normal, pupils equal, round, reactive to light and accommodation III,IV, VI: ptosis not present, extra-ocular motions intact bilaterally V,VII: smile symmetric, facial light touch sensation normal bilaterally VIII: hearing normal bilaterally IX,X: uvula rises symmetrically XI: bilateral shoulder shrug XII: midline tongue extension Motor: Right : Upper extremity   5/5    Left:     Upper extremity   4+/5  Lower extremity   5/5       Left lower  extremity weakness in foot eversion and inversion as well as leg flexion and foot dorsiflexion. Plantar flexion appears to be intact.   Sensory: Pinprick and light touch intact throughout, bilaterally in the upper extremities however he has decreased sensation in his left leg compared to right below the knee Deep Tendon Reflexes: 2+ and symmetric throughout upper extremities, 2+ right knee jerk with no left knee jerk, absent right ankle jerk and left ankle jerk. Plantars: Right: downgoing   Left: downgoing Cerebellar: normal finger-to-nose,  Gait: Not tested      Lab Results: Basic Metabolic Panel:  Recent Labs Lab 04/09/16 1837 04/10/16 0350  NA 138 138  K 4.2 3.6  CL 106 107  CO2 23 25  GLUCOSE 135* 130*  BUN 14 15  CREATININE 1.52* 1.83*  CALCIUM 8.4* 8.1*  MG 1.8  --     Liver Function Tests: No results for input(s): AST, ALT, ALKPHOS, BILITOT, PROT, ALBUMIN in the last 168 hours. No results for input(s): LIPASE, AMYLASE in the last 168 hours. No results for input(s): AMMONIA in the last 168 hours.  CBC:  Recent Labs Lab 04/09/16 1837 04/10/16 0350  WBC 15.5* 11.8*  HGB 15.6 14.3  HCT 45.7 42.2  MCV 96.6 95.7  PLT 146* 131*    Cardiac Enzymes: No results for input(s): CKTOTAL, CKMB, CKMBINDEX, TROPONINI in the last 168 hours.  Lipid Panel: No results for input(s): CHOL, TRIG, HDL, CHOLHDL, VLDL, LDLCALC in the last 168 hours.  CBG:  Recent Labs Lab 04/09/16 0704 04/09/16 1542 04/10/16 0817  GLUCAP 144* 129* 148*    Microbiology: Results for orders placed or performed during the hospital encounter of 04/09/16  MRSA PCR Screening     Status: None   Collection Time: 04/09/16  6:09 PM  Result Value Ref Range Status   MRSA by PCR NEGATIVE NEGATIVE Final    Comment:        The GeneXpert MRSA Assay (FDA approved for NASAL specimens only), is one component of a comprehensive MRSA colonization surveillance program. It is not intended to diagnose  MRSA infection nor to guide or monitor treatment for MRSA infections.     Coagulation Studies:  Recent Labs  04/09/16 1837  LABPROT 14.0  INR 1.08    Imaging: Ct Head Wo Contrast  Result Date: 04/10/2016 CLINICAL DATA:  75 year old male with left leg numbness. EXAM: CT HEAD WITHOUT CONTRAST TECHNIQUE: Contiguous axial images were obtained from the base of the skull through the vertex without intravenous contrast. COMPARISON:  None. FINDINGS: Brain: There is mild age-related atrophy and chronic microvascular ischemic changes. 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 There is no acute intracranial hemorrhage. No mass effect or midline shift noted. No extra-axial fluid collection. Vascular: No hyperdense vessel or unexpected calcification. Skull: Normal. Negative for fracture or focal lesion. Sinuses/Orbits: There is extensive mucoperiosteal thickening of the paranasal sinuses with near complete opacification of the maxillary sinuses likely related to chronic sinusitis. No air-fluid levels. The mastoid air cells are clear. Other: None IMPRESSION: No acute intracranial hemorrhage. Mild age-related atrophy and chronic microvascular ischemic changes. Chronic appearing paranasal sinus disease. Electronically Signed   By: Anner Crete M.D.   On: 04/10/2016 01:37   Dg Chest Port 1 View  Result Date: 04/09/2016 CLINICAL DATA:  Post AAA repair. EXAM: PORTABLE CHEST 1 VIEW COMPARISON:  01/20/2013 FINDINGS: Grossly unchanged enlarged cardiac silhouette and mediastinal contours with slight differences likely attributable to decreased lung volumes and AP projection. Pulmonary vasculature appears indistinct with cephalization of flow. Perihilar heterogeneous opacities favored to represent atelectasis. No supine evidence of pleural effusion or pneumothorax. No acute osseus abnormalities. IMPRESSION: Findings suggestive of mild pulmonary edema and  perihilar atelectasis on this hypoventilated AP supine examination. Electronically Signed   By: Sandi Mariscal M.D.   On: 04/09/2016 17:10    Assessment and plan per attending neurologist  Etta Quill PA-C [facility] [phone]  04/10/2016, 11:19 AM   Assessment/Plan:  This is a pleasant 75 year old male status post abdominal aortic endovascular stenting and graft. Postsurgery patient noted that he has difficulty moving his left leg. He denies low back pain and radiculopathy.   On exam patient has Left lower extremity weakness in foot eversion and inversion as well as leg flexion and foot dorsiflexion. He has numbness below the knee. 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 Other possibilities in the differential is intracranial etiology less likely lumbar radiculopathy as he denies back pain or radicular symptoms.    Recommend:  1) MRI of brain and MRI of lumbar spine however suspect a sciatic neuropathy 2) emg/ncs outpatient to evaluate for sciatic neuropathy 3) Physical therapy 4) Orthosis if needed for left foot drop   Personally examined patient and images, and have participated in and made any corrections needed to history, physical, neuro exam,assessment and plan as stated above.  I have personally obtained the history, evaluated lab date, reviewed imaging studies and agree with radiology interpretations.    Sarina Ill, MD [facility] [phone]

## 2016-04-11 ENCOUNTER — Encounter (HOSPITAL_COMMUNITY): Payer: Self-pay | Admitting: Vascular Surgery

## 2016-04-11 ENCOUNTER — Inpatient Hospital Stay (HOSPITAL_COMMUNITY): Payer: Commercial Managed Care - HMO

## 2016-04-11 LAB — CBC
HCT: 44.6 % (ref 39.0–52.0)
HEMOGLOBIN: 15.1 g/dL (ref 13.0–17.0)
MCH: 33 pg (ref 26.0–34.0)
MCHC: 33.9 g/dL (ref 30.0–36.0)
MCV: 97.6 fL (ref 78.0–100.0)
PLATELETS: 115 10*3/uL — AB (ref 150–400)
RBC: 4.57 MIL/uL (ref 4.22–5.81)
RDW: 14 % (ref 11.5–15.5)
WBC: 14 10*3/uL — AB (ref 4.0–10.5)

## 2016-04-11 LAB — BASIC METABOLIC PANEL
ANION GAP: 10 (ref 5–15)
BUN: 19 mg/dL (ref 6–20)
CHLORIDE: 104 mmol/L (ref 101–111)
CO2: 24 mmol/L (ref 22–32)
Calcium: 8.3 mg/dL — ABNORMAL LOW (ref 8.9–10.3)
Creatinine, Ser: 2.07 mg/dL — ABNORMAL HIGH (ref 0.61–1.24)
GFR calc Af Amer: 34 mL/min — ABNORMAL LOW (ref >60)
GFR, EST NON AFRICAN AMERICAN: 30 mL/min — AB (ref >60)
Glucose, Bld: 139 mg/dL — ABNORMAL HIGH (ref 65–99)
POTASSIUM: 3.8 mmol/L (ref 3.5–5.1)
SODIUM: 138 mmol/L (ref 135–145)

## 2016-04-11 LAB — GLUCOSE, CAPILLARY
GLUCOSE-CAPILLARY: 125 mg/dL — AB (ref 65–99)
GLUCOSE-CAPILLARY: 151 mg/dL — AB (ref 65–99)
Glucose-Capillary: 140 mg/dL — ABNORMAL HIGH (ref 65–99)
Glucose-Capillary: 202 mg/dL — ABNORMAL HIGH (ref 65–99)

## 2016-04-11 LAB — POCT ACTIVATED CLOTTING TIME
ACTIVATED CLOTTING TIME: 208 s
Activated Clotting Time: 230 seconds

## 2016-04-11 MED ORDER — CLOPIDOGREL BISULFATE 75 MG PO TABS
75.0000 mg | ORAL_TABLET | Freq: Every day | ORAL | Status: DC
  Administered 2016-04-12 – 2016-04-14 (×3): 75 mg via ORAL
  Filled 2016-04-11 (×3): qty 1

## 2016-04-11 NOTE — Progress Notes (Signed)
NEURO HOSPITALIST CONSULT NOTE   Requestig physician: Dr. Trula Slade   Reason for Consult: left LE weakness post surgery  Interval history 04/11/2016: Patient is improving today, he has walked around the hallway, no back pain, still with weakness in the leg but did not notice foot drop as he was walking.    History obtained from:  Patient     HPI:                                                                                                                                          Kevin Frank is an 75 y.o. male status post abdominal aortic endovascular fenestrated stent graft with left brachial artery access. Status post surgery patient noted that he had difficulty lifting his left leg and his left leg had decreased sensation. Patient reports new onset numbness below the knee and difficulty moving his foot up and side to side. Also numbness below the knee which is improving. He denies low back pain and radiculopathy.    Past Medical History:  Diagnosis Date  . AAA (abdominal aortic aneurysm) ([place]) 01/2008   3.2cm  . BPH (benign prostatic hyperplasia)   . COPD (chronic obstructive pulmonary disease) ([place])   . Diabetes mellitus    type II  . Gout   . History of kidney stones   . Hypercholesterolemia   . Hyperlipidemia   . Hypertension   . Normal nuclear stress test 2011  . Obesity   . Obstructive sleep apnea on CPAP   . Presence of indwelling urinary catheter   . Shortness of breath    due to cigarette abuse  . Tobacco abuse     Past Surgical History:  Procedure Laterality Date  . CHOLECYSTECTOMY  1997   [place]  . PERIPHERAL VASCULAR CATHETERIZATION N/A 04/10/2016   Procedure: Renal Angiography;  Surgeon: Angelia Mould, MD;  Location: [facility];  Service: Cardiovascular;  Laterality: N/A;  . TRANSURETHRAL RESECTION OF PROSTATE  01/07/2011   Procedure: TRANSURETHRAL RESECTION OF THE PROSTATE (TURP);  Surgeon: Marissa Nestle;   Location: [facility];  Service: Urology;  Laterality: N/A;    Family History  Problem Relation Age of Onset  . Liver disease Father   . Lymphoma Mother   . Diabetes Mother   . Hypertension Mother   . Cancer Mother   . Mental retardation      sibling -- from birth       Social History:  reports that he has been smoking Cigarettes.  He has a 100.00 pack-year smoking history. He has never used smokeless tobacco. He reports that he drinks alcohol. He reports that he does not use drugs.  Allergies  Allergen Reactions  . No Known Allergies     MEDICATIONS:  Scheduled: . allopurinol  300 mg Oral Daily  . amLODipine  10 mg Oral Daily  . aspirin  325 mg Oral Daily  . atorvastatin  20 mg Oral Daily  . azelastine  2 spray Each Nare BID  . docusate sodium  100 mg Oral Daily  . enoxaparin (LOVENOX) injection  30 mg Subcutaneous Q24H  . furosemide  40 mg Oral Daily  . insulin aspart  0-15 Units Subcutaneous TID WC  . metoprolol succinate  25 mg Oral Daily  . mometasone-formoterol  2 puff Inhalation BID  . nicotine  14 mg Transdermal Daily  . pantoprazole  40 mg Oral Daily  . sertraline  50 mg Oral Daily  . tiotropium  18 mcg Inhalation Daily     ROS:                                                                                                                                       History obtained from the patient  General ROS: negative for - chills, fatigue, fever, night sweats, weight gain or weight loss Psychological ROS: negative for - behavioral disorder, hallucinations, memory difficulties, mood swings or suicidal ideation Ophthalmic ROS: negative for - blurry vision, double vision, eye pain or loss of vision ENT ROS: negative for - epistaxis, nasal discharge, oral lesions, sore throat, tinnitus or vertigo Allergy and Immunology ROS: negative for - hives or  itchy/watery eyes Hematological and Lymphatic ROS: negative for - bleeding problems, bruising or swollen lymph nodes Endocrine ROS: negative for - galactorrhea, hair pattern changes, polydipsia/polyuria or temperature intolerance Respiratory ROS: negative for - cough, hemoptysis, shortness of breath or wheezing Cardiovascular ROS: negative for - chest pain, dyspnea on exertion, edema or irregular heartbeat Gastrointestinal ROS: negative for - abdominal pain, diarrhea, hematemesis, nausea/vomiting or stool incontinence Genito-Urinary ROS: negative for - dysuria, hematuria, incontinence or urinary frequency/urgency Musculoskeletal ROS: negative for - joint swelling or muscular weakness Neurological ROS: as noted in HPI Dermatological ROS: negative for rash and skin lesion changes   Blood pressure 132/79, pulse 69, temperature 98.5 F (36.9 C), temperature source Oral, resp. rate 20, weight 224 lb (101.6 kg), SpO2 94 %.   Neurologic Examination:                                                                                                      HEENT-  Normocephalic, no lesions, without obvious abnormality.  Normal external eye and conjunctiva.  Normal TM's bilaterally.  Normal auditory canals and  external ears. Normal external nose, mucus membranes and septum.  Normal pharynx. Cardiovascular- S1, S2 normal, pulses palpable throughout   Lungs- chest clear, no wheezing, rales, normal symmetric air entry Abdomen- normal findings: bowel sounds normal Extremities- no edema Lymph-no adenopathy palpable Musculoskeletal-no joint tenderness, deformity or swelling Skin-warm and dry, no hyperpigmentation, vitiligo, or suspicious lesions  Neurological Examination Mental Status: Alert, oriented.  Speech fluent without evidence of aphasia.  Able to follow 3 step commands without difficulty. Cranial Nerves: II:  Visual fields grossly normal, pupils equal, round, reactive to light and  accommodation III,IV, VI: ptosis not present, extra-ocular motions intact bilaterally V,VII: smile symmetric, facial light touch sensation normal bilaterally VIII: hearing normal bilaterally IX,X: uvula rises symmetrically XI: bilateral shoulder shrug XII: midline tongue extension Motor: Right : Upper extremity   5/5    Left:     Upper extremity   4+/5  Lower extremity   5/5       Left lower extremity: weakness in foot eversion and inversion as well as leg flexion and foot dorsiflexion. Plantar flexion appears to be intact. Improved Today.   Sensory: Pinprick and light touch intact throughout, bilaterally in the upper extremities however he has decreased sensation in his left leg compared to right below the knee Deep Tendon Reflexes: 2+ and symmetric throughout upper extremities, 2+ right knee jerk with no left knee jerk, absent right ankle jerk and left ankle jerk. Plantars: Right: downgoing   Left: downgoing Cerebellar: normal finger-to-nose,  Gait: Not tested      Lab Results: Basic Metabolic Panel:  Recent Labs Lab 04/09/16 1837 04/10/16 0350 04/11/16 0347  NA 138 138 138  K 4.2 3.6 3.8  CL 106 107 104  CO2 '23 25 24  '$ GLUCOSE 135* 130* 139*  BUN '14 15 19  '$ CREATININE 1.52* 1.83* 2.07*  CALCIUM 8.4* 8.1* 8.3*  MG 1.8  --   --     Liver Function Tests: No results for input(s): AST, ALT, ALKPHOS, BILITOT, PROT, ALBUMIN in the last 168 hours. No results for input(s): LIPASE, AMYLASE in the last 168 hours. No results for input(s): AMMONIA in the last 168 hours.  CBC:  Recent Labs Lab 04/09/16 1837 04/10/16 0350 04/11/16 0347  WBC 15.5* 11.8* 14.0*  HGB 15.6 14.3 15.1  HCT 45.7 42.2 44.6  MCV 96.6 95.7 97.6  PLT 146* 131* 115*    Cardiac Enzymes: No results for input(s): CKTOTAL, CKMB, CKMBINDEX, TROPONINI in the last 168 hours.  Lipid Panel: No results for input(s): CHOL, TRIG, HDL, CHOLHDL, VLDL, LDLCALC in the last 168 hours.  CBG:  Recent  Labs Lab 04/10/16 0817 04/10/16 1208 04/10/16 1605 04/10/16 1753 04/11/16 0814  GLUCAP 148* 137* 129* 133* 125*    Microbiology: Results for orders placed or performed during the hospital encounter of 04/09/16  MRSA PCR Screening     Status: None   Collection Time: 04/09/16  6:09 PM  Result Value Ref Range Status   MRSA by PCR NEGATIVE NEGATIVE Final    Comment:        The GeneXpert MRSA Assay (FDA approved for NASAL specimens only), is one component of a comprehensive MRSA colonization surveillance program. It is not intended to diagnose MRSA infection nor to guide or monitor treatment for MRSA infections.     Coagulation Studies:  Recent Labs  04/09/16 1837  LABPROT 14.0  INR 1.08    Imaging: Ct Head Wo Contrast  Result Date: 04/10/2016 CLINICAL DATA:  75 year old male with left  leg numbness. EXAM: CT HEAD WITHOUT CONTRAST TECHNIQUE: Contiguous axial images were obtained from the base of the skull through the vertex without intravenous contrast. COMPARISON:  None. FINDINGS: Brain: There is mild age-related atrophy and chronic microvascular ischemic changes. 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 There is no acute intracranial hemorrhage. No mass effect or midline shift noted. No extra-axial fluid collection. Vascular: No hyperdense vessel or unexpected calcification. Skull: Normal. Negative for fracture or focal lesion. Sinuses/Orbits: There is extensive mucoperiosteal thickening of the paranasal sinuses with near complete opacification of the maxillary sinuses likely related to chronic sinusitis. No air-fluid levels. The mastoid air cells are clear. Other: None IMPRESSION: No acute intracranial hemorrhage. Mild age-related atrophy and chronic microvascular ischemic changes. Chronic appearing paranasal sinus disease. Electronically Signed   By: Anner Crete M.D.   On: 04/10/2016 01:37   Mr Brain Wo  Contrast  Result Date: 04/11/2016 CLINICAL DATA:  Initial evaluation for left leg numbness status post aneurysm repair. EXAM: MRI HEAD WITHOUT CONTRAST TECHNIQUE: Multiplanar, multiecho pulse sequences of the brain and surrounding structures were obtained without intravenous contrast. COMPARISON:  Prior CT from 04/10/2016. FINDINGS: Brain: Diffuse prominence of the CSF containing spaces compatible with generalized age-related cerebral atrophy. Patchy and confluent T2/FLAIR hyperintensity within the periventricular and deep white matter both cerebral hemispheres most compatible with chronic microvascular ischemic disease, mild in nature. Chronic microvascular ischemic changes present within the pons as well. Cortical encephalomalacia with associated gliosis present within the anterior right frontal lobe, compatible with remote ischemic infarct. No other areas of remote infarction identified. Small focus is susceptibility artifact noted within the right cerebellar hemisphere (series 8, image 25), likely a small chronic microhemorrhage, most likely hypertensive in nature. No other evidence for acute or chronic intracranial hemorrhage. No abnormal foci of restricted diffusion to suggest acute or subacute ischemia. Gray-white matter differentiation maintained. No mass lesion, midline shift, or mass effect. No hydrocephalus. No extra-axial fluid collection. Major dural sinuses are grossly patent. Pituitary gland and suprasellar region within normal limits. Vascular: Major intracranial vascular flow voids are maintained. Skull and upper cervical spine: Craniocervical junction normal. Visualized upper cervical spine unremarkable. Bone marrow signal intensity within normal limits. No scalp soft tissue abnormality. Sinuses/Orbits: Globes and orbital soft tissues within normal limits. Moderate mucosal thickening within the ethmoidal air cells, sphenoid sinuses, and maxillary sinuses. Small fluid level noted within the right  maxillary sinus. Trace opacity left mastoid air cells. Inner ear structures within normal limits. IMPRESSION: 1. No acute intracranial infarct or other process identified. 2. 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 3. Single small chronic micro hemorrhage within the right cerebellar hemisphere, likely hypertensive in nature. 4. Mild atrophy with chronic microvascular ischemic disease. 5. Moderate paranasal sinus disease as above. Electronically Signed   By: Jeannine Boga M.D.   On: 04/11/2016 02:01   Mr Lumbar Spine Wo Contrast  Result Date: 04/11/2016 CLINICAL DATA:  Initial evaluation for left leg weakness with numbness status post aneurysm repair. EXAM: MRI LUMBAR SPINE WITHOUT CONTRAST TECHNIQUE: Multiplanar, multisequence MR imaging of the lumbar spine was performed. No intravenous contrast was administered. COMPARISON:  Prior CT from 01/21/2016. FINDINGS: Segmentation: Study somewhat limited by extensive susceptibility artifact from aortic aneurysm hardware. Normal segmentation. Lowest well-formed disc is labeled the L5-S1 level. Alignment: Mild levoscoliosis. Vertebral bodies are otherwise normally aligned with preservation of the normal lumbar lordosis. No listhesis. Vertebrae: Vertebral body heights grossly maintained. No evidence  for acute or chronic fracture. Signal intensity within the vertebral body bone marrow grossly normal, although evaluation limited by susceptibility artifact. No discrete osseous lesions appreciated. Conus medullaris: Extends to the L1 level. Visualized cord is normal in appearance. Paraspinal and other soft tissues: Visualized paraspinous soft tissues demonstrate no acute abnormality. Large 6.1 cm cyst noted within the right kidney. Again, extends to susceptibility artifact from aneurysm repair. Disc levels: L1-2:  Negative. L2-3: Mild diffuse disc bulge with disc desiccation. Disc bulging is eccentric to the left. No discrete focal  disc herniation. No significant canal or foraminal stenosis. L3-4: Mild diffuse disc bulge with intervertebral disc space narrowing and probable disc desiccation. Superimposed right foraminal disc protrusion closely approximates the exiting right L3 nerve root without frank neural impingement (series 7, image 19). No significant canal stenosis. Mild right foraminal narrowing. L4-5: No significant disc bulge. Mild facet hypertrophy. Central canal widely patent. Mild bilateral foraminal narrowing. L5-S1: Shallow broad-based central/left subarticular disc protrusion (series 4, image 7). Associated annular fissure. Protruding discs mildly encroaches upon the left lateral recess, and contacts the transiting left S1 nerve root (series 8, image 31). This could potentially result in left lower extremity radicular symptoms. Mild bilateral facet arthrosis with ligamentum flavum hypertrophy. Mild reactive edema about the right L5-S1 facet (series 6, image 3), presumably degenerative in nature. No significant canal or foraminal stenosis. IMPRESSION: 1. Shallow central/left subarticular disc protrusion at L5-S1, contacting the transiting left S1 nerve root. This could potentially result in left lower extremity radicular symptoms. 2. Shallow right foraminal disc protrusion at L3-4, closely approximating the exiting right L3 nerve root. 3. Mild left eccentric degenerative disc bulging at L2-3 without significant stenosis or evidence for neural impingement. 4. Mild bilateral facet arthropathy at L5-S1, with associated mild reactive edema about the right L5-S1 facet. Electronically Signed   By: Jeannine Boga M.D.   On: 04/11/2016 06:27   Dg Chest Port 1 View  Result Date: 04/09/2016 CLINICAL DATA:  Post AAA repair. EXAM: PORTABLE CHEST 1 VIEW COMPARISON:  01/20/2013 FINDINGS: Grossly unchanged enlarged cardiac silhouette and mediastinal contours with slight differences likely attributable to decreased lung volumes and AP  projection. Pulmonary vasculature appears indistinct with cephalization of flow. Perihilar heterogeneous opacities favored to represent atelectasis. No supine evidence of pleural effusion or pneumothorax. No acute osseus abnormalities. IMPRESSION: Findings suggestive of mild pulmonary edema and perihilar atelectasis on this hypoventilated AP supine examination. Electronically Signed   By: Sandi Mariscal M.D.   On: 04/09/2016 17:10    Assessment and plan per attending neurologist  Etta Quill PA-C [facility] [phone]  04/11/2016, 10:08 AM   Assessment/Plan:  This is a pleasant 75 year old male status post abdominal aortic endovascular stenting and graft. Postsurgery patient noted that he has difficulty moving his left leg. He denies low back pain and radiculopathy.   On exam patient has Improving left lower extremity weakness in foot eversion and inversion as well as leg flexion and foot dorsiflexion. He has numbness below the knee. 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 Other possibilities in the differential is intracranial etiology less likely lumbar radiculopathy as he denies back pain or radicular symptoms. He has some mild left arm weakness as well.   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 I do not think MRI Lumbar spine needed inpatient. This could be done outpatient if needed, lumbar radic much less likely in this scenario. 2) emg/ncs outpatient  if symptoms persist 3) Physical therapy 4) Orthosis if needed for left foot drop   Personally examined patient and images, and have participated in and made any corrections needed to history, physical, neuro exam,assessment and plan as stated above.  I have personally obtained the history, evaluated lab date, reviewed imaging studies and agree with radiology interpretations.    Sarina Ill, MD [facility]  [facility] [phone]  A total of 25 minutes was spent in with this patient. Over half this time was spent on counseling patient on the diagnosis and different therapeutic options available.

## 2016-04-11 NOTE — Progress Notes (Signed)
[facility] Progress Note  Subjective  - POD #2  Says left leg is feeling a bit better.   Objective Vitals:   04/11/16 0600 04/11/16 0700  BP: (!) 142/130 (!) 147/71  Pulse: 70 65  Resp: 17 20  Temp:      Intake/Output Summary (Last 24 hours) at 04/11/16 0730 Last data filed at 04/11/16 0700  Gross per 24 hour  Intake             2400 ml  Output             2975 ml  Net             -575 ml   Left antecubital space without hematoma Left groin no hematoma, some ecchymosis at pubic area. Right groin now hematoma. Sensation improving below left knee. Moving left toes today.  Palpable DP pulses bilaterally.   MR Brain 04/10/16 IMPRESSION: 1. No acute intracranial infarct or other process identified. 2. 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 3. Single small chronic micro hemorrhage within the right cerebellar hemisphere, likely hypertensive in nature. 4. Mild atrophy with chronic microvascular ischemic disease. 5. Moderate paranasal sinus disease as above.  MR Lumbar Spine 04/10/16 IMPRESSION: 1. Shallow central/left subarticular disc protrusion at L5-S1, contacting the transiting left S1 nerve root. This could potentially result in left lower extremity radicular symptoms. 2. Shallow right foraminal disc protrusion at L3-4, closely approximating the exiting right L3 nerve root. 3. Mild left eccentric degenerative disc bulging at L2-3 without significant stenosis or evidence for neural impingement. 4. Mild bilateral facet arthropathy at L5-S1, with associated mild reactive edema about the right L5-S1 facet.  Assessment/Planning: 75 y.o. male is s/p: Fenestrated endovascular stent graft repair of juxtarenal abdominal aortic aneurysm with left brachial access POD 2 CO2 aortogram with angioplasty and stenting of left renal artery POD 1   Renal duplex yesterday showing no flow to left renal artery. Patient subsequently  underwernt urgent renal angiogram with angioplasty and stenting of left renal artery. Right renal artery is patent. Creatinine continues to rise today to 2.07. Good urine output. Continue IVF. No nephrotoxic drugs ordered. Keep foley today for accurate output recording and previous urinary retention. Follow creatinine.   Left leg sensation and motor function improved this am. MRI brain negative for acute infarct. MR lumbar spine with some left disc protrusion at L5-S1 which may possible cause LLE radicular symptoms. Appreciate neurology consult.   Hgb stable. Will discuss when to start Plavix with Dr. Trula Slade.   Wean 02 as tolerated.   Follow WBC count. Afebrile.   PT eval today.   Will discuss transfer out of ICU with Dr. Julien Nordmann A Trinh 04/11/2016 7:30 AM --  Laboratory CBC    Component Value Date/Time   WBC 14.0 (H) 04/11/2016 0347   HGB 15.1 04/11/2016 0347   HCT 44.6 04/11/2016 0347   PLT 115 (L) 04/11/2016 0347    BMET    Component Value Date/Time   NA 138 04/11/2016 0347   K 3.8 04/11/2016 0347   CL 104 04/11/2016 0347   CO2 24 04/11/2016 0347   GLUCOSE 139 (H) 04/11/2016 0347   BUN 19 04/11/2016 0347   CREATININE 2.07 (H) 04/11/2016 0347   CREATININE 0.98 02/08/2016 1300   CALCIUM 8.3 (L) 04/11/2016 0347   GFRNONAA 30 (L) 04/11/2016 0347   GFRAA 34 (L) 04/11/2016 0347    COAG Lab Results  Component Value Date  INR 1.08 04/09/2016   INR 0.99 04/02/2016   No results found for: PTT  Antibiotics Anti-infectives    Start     Dose/Rate Route Frequency Ordered Stop   04/09/16 1545  cefUROXime (ZINACEF) 1.5 g in dextrose 5 % 50 mL IVPB     1.5 g 100 mL/hr over 30 Minutes Intravenous Every 12 hours 04/09/16 1543 04/10/16 1806   04/09/16 1310  cefUROXime (ZINACEF) 1.5 g in dextrose 5 % 50 mL IVPB  Status:  Discontinued     1.5 g 100 mL/hr over 30 Minutes Intravenous 30 min pre-op 04/09/16 1310 04/09/16 1543   04/09/16 0630  dextrose 5 % with  cefUROXime (ZINACEF) AD[facility] Med    Comments:  Debbe Bales, Meredit: cabinet override      04/09/16 0630 04/09/16 1844   04/09/16 0628  cefUROXime (ZINACEF) 1.5 g in dextrose 5 % 50 mL IVPB     1.5 g 100 mL/hr over 30 Minutes Intravenous 30 min pre-op 04/09/16 0628 04/09/16 [place], PA-C [facility] Office: [phone] Pager: [phone] 04/11/2016 7:30 AM  Agree with the above.  I have seen and examined the patient and communicated with his wife via telephone Acute renal insufficiency:  Cr up again today.  Left renal stent yesterday.  I suspect this is secondary to contrast nephropathy and hemodynamic changes during his procedure.  Will continue with IV hydration.  This should improve with time Left leg sensory deficits and motor dysfunction:  This appears much better tonight.  He walked 2x today and reports he is getting better.  MRI of the head was negative for acute event.  MRI of lower back suggests possible left S1 nerve irritation.  Will continue to follow Will start Plavix given renal stents Transfer to step down tomorrow

## 2016-04-11 NOTE — Evaluation (Signed)
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 PMHx: BPH, COPD, DM, HTN, gout  Clinical Impression  Pt very pleasant, moving well but noted to have decreased sensation particularly left foot being numb to light touch as well as decreased strength of left hip flexors, dorsiflexors and hamstring. Pt with decreased transfers and gait related to above noted LLE deficits as well as pain and will benefit from acute therapy to maximize mobility, strength, function and gait to return pt to PLOF.   sats 90-95% on RA HR 69 BP 132/79    Follow Up Recommendations Home health PT;Supervision for mobility/OOB    Equipment Recommendations  Rolling walker with 5" wheels    Recommendations for Other Services OT consult     Precautions / Restrictions Precautions Precautions: Fall Precaution Comments: left foot numb since surgery      Mobility  Bed Mobility               General bed mobility comments: in chair on arrival  Transfers Overall transfer level: Needs assistance   Transfers: Sit to/from Stand Sit to Stand: Min guard         General transfer comment: cues for hand placement and safety  Ambulation/Gait Ambulation/Gait assistance: Min assist Ambulation Distance (Feet): 150 Feet Assistive device: Rolling walker (2 wheeled) Gait Pattern/deviations: Step-through pattern;Decreased stride length;Trunk flexed   Gait velocity interpretation: Below normal speed for age/gender General Gait Details: pt with 3 periods of partial buckling LLE with gait, able to recover with use of RW but assist for safety and guarding with cues for RW use  Stairs            Wheelchair Mobility    Modified Rankin (Stroke Patients Only)       Balance Overall balance assessment: Needs  assistance   Sitting balance-Leahy Scale: Good       Standing balance-Leahy Scale: Fair                               Pertinent Vitals/Pain Pain Assessment: 0-10 Pain Score: 4  Pain Location: abdominal Pain Descriptors / Indicators: Sore Pain Intervention(s): Limited activity within patient's tolerance;Repositioned    Home Living Family/patient expects to be discharged to:: Private residence Living Arrangements: Spouse/significant other Available Help at Discharge: Family;Available 24 hours/day Type of Home: House Home Access: Stairs to enter   [employer] of Steps: 2 Home Layout: Two level;Able to live on main level with bedroom/bathroom;Laundry or work area in [employer]: None      Prior Function Level of Independence: Independent         Comments: 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 wife can care for herself but only provide minimal physical assist     Hand Dominance        Extremity/Trunk Assessment   Upper Extremity Assessment: Overall WFL for tasks assessed           Lower Extremity Assessment: LLE deficits/detail   LLE Deficits / Details: hip flexion 3/5, knee extension 5/5, knee flexion 4/5, dorsiflexion 3/5. Pt lacking light touch to entire left foot with decreased sensation through calf  Cervical / Trunk Assessment: Kyphotic  Communication   Communication: HOH  Cognition Arousal/Alertness: Awake/alert Behavior During Therapy: WFL for tasks assessed/performed  Overall Cognitive Status: Within Functional Limits for tasks assessed                      General Comments      Exercises     Assessment/Plan    PT Assessment Patient needs continued PT services  PT Problem List Decreased strength;Decreased mobility;Decreased activity tolerance;Decreased balance;Decreased knowledge of use of DME;Impaired sensation          PT Treatment Interventions Gait training;Stair training;Functional mobility  training;Balance training;Therapeutic exercise;Patient/family education;Therapeutic activities;DME instruction    PT Goals (Current goals can be found in the Care Plan section)  Acute Rehab PT Goals Patient Stated Goal: return to my farm PT Goal Formulation: With patient Time For Goal Achievement: 04/25/16 Potential to Achieve Goals: Good    Frequency Min 3X/week   Barriers to discharge        Co-evaluation               End of Session Equipment Utilized During Treatment: Gait belt Activity Tolerance: Patient tolerated treatment well Patient left: in chair;with call bell/phone within reach;with nursing/sitter in room Nurse Communication: Mobility status         Time: 3383-2919 PT Time Calculation (min) (ACUTE ONLY): 19 min   Charges:   PT Evaluation $PT Eval Moderate Complexity: 1 Procedure     PT G CodesMelford Aase 04/11/2016, 9:04 AM Elwyn Reach, [place]

## 2016-04-11 NOTE — Care Management Note (Signed)
Case Management Note  Patient Details  Name: Kevin Frank MRN: [id number] Date of Birth: [birth year]  Subjective/Objective:   S/p  Fenestrated endovascular stent graft repair of juxtarenal abdominal aortic aneurysm with left brachial access              Action/Plan:  PTA independent from home with wife.  Pt is agreeable to HHPT - given choice and pt chose [facility].  Pt is also agreeable to RW - pt also chose [facility] from choice given.  CM contacted [facility] to inform of pending referral for both DME and HH.  CM requested both orders via physician sticky note   Expected Discharge Date:                  Expected Discharge Plan:  [place]  In-House Referral:     Discharge planning Services  CM Consult  Post Acute Care Choice:    Choice offered to:     DME Arranged:    DME Agency:     HH Arranged:    HH Agency:     Status of Service:  In process, will continue to follow  If discussed at Long Length of Stay Meetings, dates discussed:    Additional Comments:  Maryclare Labrador, RN 04/11/2016, 1:35 PM

## 2016-04-12 ENCOUNTER — Encounter (HOSPITAL_COMMUNITY): Payer: Self-pay | Admitting: Surgery

## 2016-04-12 LAB — CBC
HEMATOCRIT: 38.6 % — AB (ref 39.0–52.0)
Hemoglobin: 13.1 g/dL (ref 13.0–17.0)
MCH: 32.3 pg (ref 26.0–34.0)
MCHC: 33.9 g/dL (ref 30.0–36.0)
MCV: 95.3 fL (ref 78.0–100.0)
PLATELETS: 111 10*3/uL — AB (ref 150–400)
RBC: 4.05 MIL/uL — ABNORMAL LOW (ref 4.22–5.81)
RDW: 13.9 % (ref 11.5–15.5)
WBC: 11.3 10*3/uL — AB (ref 4.0–10.5)

## 2016-04-12 LAB — BASIC METABOLIC PANEL
ANION GAP: 7 (ref 5–15)
BUN: 24 mg/dL — ABNORMAL HIGH (ref 6–20)
CALCIUM: 8.1 mg/dL — AB (ref 8.9–10.3)
CO2: 24 mmol/L (ref 22–32)
CREATININE: 1.76 mg/dL — AB (ref 0.61–1.24)
Chloride: 105 mmol/L (ref 101–111)
GFR, EST AFRICAN AMERICAN: 42 mL/min — AB (ref >60)
GFR, EST NON AFRICAN AMERICAN: 36 mL/min — AB (ref >60)
Glucose, Bld: 136 mg/dL — ABNORMAL HIGH (ref 65–99)
Potassium: 3.5 mmol/L (ref 3.5–5.1)
SODIUM: 136 mmol/L (ref 135–145)

## 2016-04-12 LAB — POCT ACTIVATED CLOTTING TIME
ACTIVATED CLOTTING TIME: 274 s
ACTIVATED CLOTTING TIME: 252 s
ACTIVATED CLOTTING TIME: 219 s
ACTIVATED CLOTTING TIME: 208 s
ACTIVATED CLOTTING TIME: 0 s
Activated Clotting Time: 186 seconds
Activated Clotting Time: 213 seconds

## 2016-04-12 LAB — GLUCOSE, CAPILLARY
GLUCOSE-CAPILLARY: 189 mg/dL — AB (ref 65–99)
GLUCOSE-CAPILLARY: 151 mg/dL — AB (ref 65–99)
Glucose-Capillary: 155 mg/dL — ABNORMAL HIGH (ref 65–99)
Glucose-Capillary: 180 mg/dL — ABNORMAL HIGH (ref 65–99)

## 2016-04-12 MED ORDER — SODIUM CHLORIDE 0.9 % IV SOLN
INTRAVENOUS | Status: DC
  Administered 2016-04-12 – 2016-04-13 (×2): via INTRAVENOUS

## 2016-04-12 NOTE — Progress Notes (Addendum)
  Progress Note    04/12/2016 8:30 AM 2 Days Post-Op  Subjective:  No complaints; thought he was going home today.  Afebrile HR 60's-80 NSR 915'A-569'V systolic 94% [id number]  IVF:  NaCl @ 100cc/hr  Vitals:   04/12/16 0700 04/12/16 0823  BP: (!) 145/66   Pulse: 66   Resp:    Temp:  98.9 F (37.2 C)    Physical Exam: Cardiac:  regular Lungs:  Non labored Incisions:  Bilateral groins are soft without hematoma. Bandage on left brachial site is clean and dry. Extremities:  Brisk doppler flow bilateral DP (palpable DP-left); +palpable left radial pulse   CBC    Component Value Date/Time   WBC 11.3 (H) 04/12/2016 0250   RBC 4.05 (L) 04/12/2016 0250   HGB 13.1 04/12/2016 0250   HCT 38.6 (L) 04/12/2016 0250   PLT 111 (L) 04/12/2016 0250   MCV 95.3 04/12/2016 0250   MCH 32.3 04/12/2016 0250   MCHC 33.9 04/12/2016 0250   RDW 13.9 04/12/2016 0250    BMET    Component Value Date/Time   NA 136 04/12/2016 0250   K 3.5 04/12/2016 0250   CL 105 04/12/2016 0250   CO2 24 04/12/2016 0250   GLUCOSE 136 (H) 04/12/2016 0250   BUN 24 (H) 04/12/2016 0250   CREATININE 1.76 (H) 04/12/2016 0250   CREATININE 0.98 02/08/2016 1300   CALCIUM 8.1 (L) 04/12/2016 0250   GFRNONAA 36 (L) 04/12/2016 0250   GFRAA 42 (L) 04/12/2016 0250    INR    Component Value Date/Time   INR 1.08 04/09/2016 1837     Intake/Output Summary (Last 24 hours) at 04/12/16 0830 Last data filed at 04/12/16 0600  Gross per 24 hour  Intake             2800 ml  Output             2935 ml  Net             -135 ml     Assessment:  75 y.o. male is s/p:  Fenestrated endovascular stent graft repair of juxtarenal abdominal aortic aneurysm with left brachial access   2 Days Post-Op  Plan: -pt doing well this am-he has good doppler flow to bilateral DP  -WBC improved this am; mild fever last pm but afebrile since -creatinine improved from 2.07 to 1.76 with good UOP-will cut IVF to 50cc/hr -left leg  sensation improved.  Motor is intact.   -transfer to the stepdown unit this am -labs in am -DVT prophylaxis:  Renal dose Lovenox   [name], PA-C [facility] [phone] 04/12/2016 8:30 AM   Looks much better today.  Spoke with neurology who feels that his left leg symptoms are likely nerve irritation from his lumbar spine.  He can have follow-up with them as an outpatient.  His left leg is improving.  His creatinine is down today with good urine output.  His Foley will be removed.  He'll be transferred to the floor.  He'll be discharged home on Plavix in the next few days.  Annamarie Major

## 2016-04-12 NOTE — Progress Notes (Signed)
RT placed pt on cpap at this time. Pt tolerating well at this time

## 2016-04-13 ENCOUNTER — Inpatient Hospital Stay (HOSPITAL_COMMUNITY): Payer: Commercial Managed Care - HMO

## 2016-04-13 DIAGNOSIS — R609 Edema, unspecified: Secondary | ICD-10-CM

## 2016-04-13 LAB — BASIC METABOLIC PANEL
ANION GAP: 9 (ref 5–15)
BUN: 25 mg/dL — AB (ref 6–20)
CHLORIDE: 105 mmol/L (ref 101–111)
CO2: 21 mmol/L — ABNORMAL LOW (ref 22–32)
Calcium: 8.3 mg/dL — ABNORMAL LOW (ref 8.9–10.3)
Creatinine, Ser: 1.56 mg/dL — ABNORMAL HIGH (ref 0.61–1.24)
GFR calc Af Amer: 48 mL/min — ABNORMAL LOW (ref >60)
GFR, EST NON AFRICAN AMERICAN: 42 mL/min — AB (ref >60)
GLUCOSE: 127 mg/dL — AB (ref 65–99)
POTASSIUM: 3.5 mmol/L (ref 3.5–5.1)
Sodium: 135 mmol/L (ref 135–145)

## 2016-04-13 LAB — GLUCOSE, CAPILLARY
GLUCOSE-CAPILLARY: 142 mg/dL — AB (ref 65–99)
GLUCOSE-CAPILLARY: 163 mg/dL — AB (ref 65–99)
Glucose-Capillary: 165 mg/dL — ABNORMAL HIGH (ref 65–99)
Glucose-Capillary: 115 mg/dL — ABNORMAL HIGH (ref 65–99)

## 2016-04-13 LAB — CBC
HCT: 37 % — ABNORMAL LOW (ref 39.0–52.0)
Hemoglobin: 12.9 g/dL — ABNORMAL LOW (ref 13.0–17.0)
MCH: 32.8 pg (ref 26.0–34.0)
MCHC: 34.9 g/dL (ref 30.0–36.0)
MCV: 94.1 fL (ref 78.0–100.0)
Platelets: 120 10*3/uL — ABNORMAL LOW (ref 150–400)
RBC: 3.93 MIL/uL — AB (ref 4.22–5.81)
RDW: 13.5 % (ref 11.5–15.5)
WBC: 10.2 10*3/uL (ref 4.0–10.5)

## 2016-04-13 NOTE — Progress Notes (Signed)
  Progress Note    04/13/2016 8:20 AM 3 Days Post-Op  Subjective:  "feeling mean as ever!" states he is having some swelling in the left leg.  Afebrile HR  60's-90's NSR 634'Z-494'I systolic 73-95% RA  IVF @ 50cc/hr  Vitals:   04/12/16 2333 04/13/16 0427  BP:  (!) 150/77  Pulse: 78 73  Resp: (!) 23 (!) 26  Temp:  98.6 F (37 C)    Physical Exam: Cardiac:  regular Lungs:  Non labored Extremities:  Left calf swollen significantly compared to the right; non tender to palpation   CBC    Component Value Date/Time   WBC 10.2 04/13/2016 0316   RBC 3.93 (L) 04/13/2016 0316   HGB 12.9 (L) 04/13/2016 0316   HCT 37.0 (L) 04/13/2016 0316   PLT 120 (L) 04/13/2016 0316   MCV 94.1 04/13/2016 0316   MCH 32.8 04/13/2016 0316   MCHC 34.9 04/13/2016 0316   RDW 13.5 04/13/2016 0316    BMET    Component Value Date/Time   NA 135 04/13/2016 0316   K 3.5 04/13/2016 0316   CL 105 04/13/2016 0316   CO2 21 (L) 04/13/2016 0316   GLUCOSE 127 (H) 04/13/2016 0316   BUN 25 (H) 04/13/2016 0316   Kevin 1.56 (H) 04/13/2016 0316   Kevin 0.98 02/08/2016 1300   CALCIUM 8.3 (L) 04/13/2016 0316   GFRNONAA 42 (L) 04/13/2016 0316   GFRAA 48 (L) 04/13/2016 0316    INR    Component Value Date/Time   INR 1.08 04/09/2016 1837     Intake/Output Summary (Last 24 hours) at 04/13/16 0820 Last data filed at 04/13/16 0400  Gross per 24 hour  Intake           234.17 ml  Output             2025 ml  Net         -1790.83 ml     Kevin [name] is s/p:  Fenestrated endovascular stent graft repair of juxtarenal abdominal aortic aneurysm with left brachial access  3 Days Post-Op  Plan: -pt doing well this am with palpable left DP pulse.  He does have considerable swelling in the left leg compared to the right, which was not present [name].  I have ordered a stat venous duplex of the left leg.  -Kevin continues to improve at 1.56 today, which is down from 1.Kevin  [name] -DVT prophylaxis:  Renal dose Lovenox   [name], PA-C [facility] [phone] 04/13/2016 8:20 AM   U/S negative for DVT Cr improving with good UOP Continue to mobilize and plan for d/c soon if clears PT  Wells [name]

## 2016-04-13 NOTE — Progress Notes (Signed)
VASCULAR LAB PRELIMINARY  PRELIMINARY  PRELIMINARY  PRELIMINARY  Left lower extremity venous duplex completed.    Preliminary report:  There is no DVT or SVT noted in the left lower extremity.   Tasheema Perrone, RVT 04/13/2016, 10:08 AM

## 2016-04-14 ENCOUNTER — Other Ambulatory Visit: Payer: Self-pay | Admitting: *Deleted

## 2016-04-14 DIAGNOSIS — I714 Abdominal aortic aneurysm, without rupture, unspecified: Secondary | ICD-10-CM

## 2016-04-14 DIAGNOSIS — Z95828 Presence of other vascular implants and grafts: Secondary | ICD-10-CM

## 2016-04-14 LAB — GLUCOSE, CAPILLARY: Glucose-Capillary: 134 mg/dL — ABNORMAL HIGH (ref 65–99)

## 2016-04-14 LAB — BASIC METABOLIC PANEL
ANION GAP: 9 (ref 5–15)
BUN: 24 mg/dL — ABNORMAL HIGH (ref 6–20)
CO2: 22 mmol/L (ref 22–32)
Calcium: 8.5 mg/dL — ABNORMAL LOW (ref 8.9–10.3)
Chloride: 104 mmol/L (ref 101–111)
Creatinine, Ser: 1.43 mg/dL — ABNORMAL HIGH (ref 0.61–1.24)
GFR, EST AFRICAN AMERICAN: 54 mL/min — AB (ref >60)
GFR, EST NON AFRICAN AMERICAN: 46 mL/min — AB (ref >60)
GLUCOSE: 120 mg/dL — AB (ref 65–99)
POTASSIUM: 3.2 mmol/L — AB (ref 3.5–5.1)
Sodium: 135 mmol/L (ref 135–145)

## 2016-04-14 LAB — CBC
HEMATOCRIT: 36.5 % — AB (ref 39.0–52.0)
Hemoglobin: 12.7 g/dL — ABNORMAL LOW (ref 13.0–17.0)
MCH: 32.5 pg (ref 26.0–34.0)
MCHC: 34.8 g/dL (ref 30.0–36.0)
MCV: 93.4 fL (ref 78.0–100.0)
Platelets: 127 10*3/uL — ABNORMAL LOW (ref 150–400)
RBC: 3.91 MIL/uL — AB (ref 4.22–5.81)
RDW: 13.4 % (ref 11.5–15.5)
WBC: 9.3 10*3/uL (ref 4.0–10.5)

## 2016-04-14 MED ORDER — AZELASTINE HCL 0.1 % NA SOLN: 2.0000 | Freq: Two times a day (BID) | NASAL | Status: DC

## 2016-04-14 MED ORDER — CLOPIDOGREL BISULFATE 75 MG PO TABS: 75.0000 mg | ORAL_TABLET | Freq: Every day | ORAL | 6 refills | Status: DC

## 2016-04-14 MED ORDER — OXYCODONE-ACETAMINOPHEN 5-325 MG PO TABS: 1.0000 | ORAL_TABLET | Freq: Four times a day (QID) | ORAL | 0 refills | Status: DC | PRN

## 2016-04-14 NOTE — Care Management Note (Signed)
Case Management Note  Patient Details  Name: Kevin Frank MRN: [id number] Date of Birth: 1940-11-20  Subjective/Objective:    s/p  Fenestrated endovascular stent graft repair of juxtarenal abdominal aortic aneurysm with left brachial access .  Patient is for dc today, per pt eval rec hhpt, patient has chosen [facility]  For HHPT and rolling walker, Butch Penny notified, and [facility] notified.                            Action/Plan:   Expected Discharge Date:                  Expected Discharge Plan:  [place]  In-House Referral:     Discharge planning Services  CM Consult  Post Acute Care Choice:    Choice offered to:     DME Arranged:    DME Agency:     HH Arranged:  PT [place]:  [place]  Status of Service:  Completed, signed off  If discussed at [place] of Stay Meetings, dates discussed:    Additional Comments:  Zenon Mayo, RN 04/14/2016, 10:55 AM

## 2016-04-14 NOTE — Progress Notes (Signed)
Pt and wife provided with d/c instructions. Both verbalized understanding. Scripts and copy of AVS given. Wheelchair to personal vehicle. Walker sent with him.

## 2016-04-14 NOTE — Discharge Summary (Signed)
EVAR Discharge Summary   Kevin Frank May 19, 1941 75 y.o. 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 male returns today for follow-up of his abdominal aortic aneurysm which was first detected by MRIin 2008 when he presented with back pain. We had difficulty seeing through bowel gas on ultrasound and therefore he has a CT scan today.  The patient continues to be medically managed for diabetes. 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 He is on a statin for hypercholesterolemia. He is medically managed for hypertension.  Hospital Course:  The patient was admitted to the hospital and taken to the operating room on 04/09/2016  And underwent:  #1:  U/s guided bilateral common femoral artery access #2:  Fenestrated repair of Juxtarenal aortic aneurysm with single left renal fenestration #3:  Distal extension x 2 #4:  Left renal artery stent #5:  Abdominal aortogram #6:  U/s guided left brachial artery access #7:  3rd order catheterization ( Left renal artery from left brachial artery) #8:  Angioplasty left renal artery #9:  bilateral renal artery angiogram  He tolerated well and was transported to the pacu in stable condition.  Later that evening, Dr. Oneida Alar was called to see the pt for numbness in his left leg.  Difficult to determine time course but this has been present for at least and hour and the patient thinks it may have been like this since the end of his operation about 4 pm. He cannot really pinpoint onset.  He had some urinary retention this evening but has improved with Foley but numbness persists.  He has no prior similar episodes and no history of back pain.  No significant hypotension.  It was a possible he had a peripheral nerve event but less likely in the scenario of no back  issues in the past.  Most likely represents a central event or possible stroke.  A head CT was ordered to r/o a bleed/infarct.    Head CT results as follows: IMPRESSION: No acute intracranial hemorrhage.  Mild age-related atrophy and chronic microvascular ischemic changes.  Chronic appearing paranasal sinus disease.  On POD 1, he had no feeling in his left leg or foot with decreased motor and sensation.  He has palpable pedal pulses.  Could be from lumbar spine issues or CVA.  Neurology was consulted.  He also had acute renal failure most likely secondary to contrast given during OR.  Continue with IV hydration and holding nephrotoxic medications.  Plavix was started.  Neuro recommended MRI of the brain and lumbar spine.  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    On 10/191/17, Renal duplex just completed.  Poor flow to left kidney, which is the renal artery of concern yestrday in the OR.  In addition the patient has an increase in his creatinine.  I think he needs to undergo urgent renal angiography with CO2 and minimal dye to evaluate and possibly stent his left renal artery.  I discussed this with the patient's wife in detail.  I am not available, and so I have asked my partner Dr Scot Dock to do the procedure.  On 04/10/16, he was taken to the [facility] lab and underwent: 1. Ultrasound-guided access to the left brachial artery 2. Second order catheterization of the ascending thoracic aorta 3. CO2 aortogram 4. Selective  catheterization of the left renal artery with left renal arteriogram 5. Angioplasty and stenting of the left renal artery with a 6.0 x 15 mm Herculink stent  Findings included the following: 1. 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 This was successfully addressed with balloon angioplasty and stenting as described above. 2. The right renal artery, superior mesenteric artery, celiac axis were patent by CO2 arteriogram.  The pt tolerated the procedure well and was transported  to the PACU in good condition.   MR Brain 04/10/16 IMPRESSION: 1. No acute intracranial infarct or other process identified. 2. 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 3. Single small chronic micro hemorrhage within the right cerebellar hemisphere, likely hypertensive in nature. 4. Mild atrophy with chronic microvascular ischemic disease. 5. Moderate paranasal sinus disease as above.  MR Lumbar Spine 04/10/16 IMPRESSION: 1. Shallow central/left subarticular disc protrusion at L5-S1, contacting the transiting left S1 nerve root. This could potentially result in left lower extremity radicular symptoms. 2. Shallow right foraminal disc protrusion at L3-4, closely approximating the exiting right L3 nerve root. 3. Mild left eccentric degenerative disc bulging at L2-3 without significant stenosis or evidence for neural impingement. 4. Mild bilateral facet arthropathy at L5-S1, with associated mild reactive edema about the right L5-S1 facet.  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 I do not think MRI Lumbar spine needed inpatient. This could be done outpatient if needed, lumbar radic much less likely in this scenario. 2) emg/ncs outpatient if symptoms persist 3) Physical therapy 4) Orthosis if needed for left foot drop PT worked with the pt and recommended HHPT.  On 04/12/16, the pt had good doppler flow to the bilateral DP.  His WBC had improved.  His creatinine continued to improve from 2.07 to 1.76 with good UOP.  His IVF were cut to 50cc/hr.  His left leg sensation was improving and motor was intact.  He was transferred to the stepdown unit.  Looks much better today.  Spoke with neurology who feels that his left leg symptoms are likely nerve irritation from his lumbar spine.  He can have follow-up with them as an outpatient.  His  left leg is improving.  His creatinine is down today with good urine output.  His Foley will be removed.  He'll be transferred to the floor.  He'll be discharged home on Plavix in the next few days.  On 04/13/16, he did have considerably more swelling in the left leg than the right.  A venous duplex was ordered and was negative for DVT.  His creatinine was down to 1.56.   On 04/14/16, pt continued to do well.  He will need to keep his legs elevated.  He was discharged home with HHPT and a rolling walker.    The remainder of the hospital course consisted of increasing mobilization and increasing intake of solids without difficulty.  CBC    Component Value Date/Time   WBC 9.3 04/14/2016 0303   RBC 3.91 (L) 04/14/2016 0303   HGB 12.7 (L) 04/14/2016 0303   HCT 36.5 (L) 04/14/2016 0303   PLT 127 (L) 04/14/2016 0303   MCV 93.4 04/14/2016 0303   MCH 32.5 04/14/2016 0303   MCHC 34.8 04/14/2016 0303   RDW 13.4 04/14/2016 0303    BMET    Component Value Date/Time   NA 135 04/14/2016 0303   K 3.2 (L) 04/14/2016 0303  CL 104 04/14/2016 0303   CO2 22 04/14/2016 0303   GLUCOSE 120 (H) 04/14/2016 0303   BUN 24 (H) 04/14/2016 0303   CREATININE 1.43 (H) 04/14/2016 0303   CREATININE 0.98 02/08/2016 1300   CALCIUM 8.5 (L) 04/14/2016 0303   GFRNONAA 46 (L) 04/14/2016 0303   GFRAA 54 (L) 04/14/2016 0303       Discharge Instructions    Call MD for:  redness, tenderness, or signs of infection (pain, swelling, bleeding, redness, odor or green/yellow discharge around incision site)    Complete by:  As directed    Call MD for:  severe or increased pain, loss or decreased feeling  in affected limb(s)    Complete by:  As directed    Call MD for:  temperature >100.5    Complete by:  As directed    Discharge instructions    Complete by:  As directed    Stop taking your Metformin and Cozaar for now.  Follow up with your primary care physician to check your renal function (BMP) next week to see if  you are able to resume these medications.   Discharge wound care:    Complete by:  As directed    Wash the groin wound with soap and water daily and pat dry. (No tub bath-only shower)  Then put a dry gauze or washcloth there to keep this area dry daily and as needed.  Do not use Vaseline or neosporin on your incisions.  Only use soap and water on your incisions and then protect and keep dry.   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 Smoking greater than 40 pack years 03/05/2016  . Incidental lung nodule, > 54m and < 856m09/13/2017  . Pul[place]sema ([facility]/13/2017  . AAA (abdominal aortic aneurysm) without rupture ([facility]/2016  . TOBACCO ABUSE 06/09/2007  . OBSTRUCTIVE SLEEP APNEA 06/09/2007  . HYPERCHOLESTEROLEMIA 05/06/2007  . GOUT 05/06/2007  . DEPRESSION 05/06/2007  . Hypertension, essential 05/06/2007  . ABDOMINAL AORTIC ANEURYSM 05/06/2007  . BENIGN PROSTATIC HYPERTROPHY, HX OF 05/06/2007   Past Medical History:  Diagnosis Date  . AAA (abdominal aortic aneurysm) ([facility]/[place]   3.2cm  . BPH (benign prostatic hyperplasia)   . COPD (chronic obstructive pulmonary disease) ([facility]  . Diabetes mellitus    type II  . Gout   . History of kidney stones   . Hypercholesterolemia   . Hyperlipidemia   . Hypertension   . Normal nuclear stress test 2011  . Obesity   . Obstructive sleep apnea on CPAP   . Presence of indwelling urinary catheter   . Shortness of breath    due to cigarette abuse  . Tobacco abuse        Medication List    STOP taking these medications   losartan 50 MG tablet Commonly known as:  COZAAR   metFORMIN 500  MG tablet Commonly known as:  GLUCOPHAGE     TAKE these medications   albuterol 108 (90 Base) MCG/ACT inhaler Commonly known as:  PROVENTIL HFA;VENTOLIN HFA Inhale  2 puffs into the lungs every 6 (six) hours as needed.   allopurinol 300 MG tablet Commonly known as:  ZYLOPRIM Take 300 mg by mouth daily.   amLODipine 10 MG tablet Commonly known as:  NORVASC Take 1 tablet (10 mg total) by mouth daily.   atorvastatin 20 MG tablet Commonly known as:  LIPITOR Take 20 mg by mouth daily.   azelastine 0.1 % nasal spray Commonly known as:  ASTELIN Place 2 sprays into both nostrils 2 (two) times daily.   budesonide-formoterol 160-4.5 MCG/ACT inhaler Commonly known as:  SYMBICORT Inhale 2 puffs into the lungs 2 (two) times daily.   clopidogrel 75 MG tablet Commonly known as:  PLAVIX Take 1 tablet (75 mg total) by mouth daily.   furosemide 40 MG tablet Commonly known as:  LASIX Take 40 mg by mouth daily.   metoprolol succinate 25 MG 24 hr tablet Commonly known as:  TOPROL-XL Take 1 tablet (25 mg total) by mouth daily.   oxyCODONE-acetaminophen 5-325 MG tablet Commonly known as:  PERCOCET/ROXICET Take 1 tablet by mouth every 6 (six) hours as needed for moderate pain.   sertraline 50 MG tablet Commonly known as:  ZOLOFT Take 50 mg by mouth daily.   SPIRIVA HANDIHALER 18 MCG inhalation capsule Generic drug:  tiotropium Place 18 mcg into inhaler and inhale daily.       Prescriptions given: 1.  Percocet #10 No Refill 2.  Plavix '75mg'$  daily #30 6 Refills  Instructions: 1.  Wash the groin wound with soap and water daily and pat dry. (No tub bath-only shower)  Then put a dry gauze or washcloth there to keep this area dry daily and as needed.  Do not use Vaseline or neosporin on your incisions.  Only use soap and water on your incisions and then protect and keep dry. 2.  Stop taking Metformin and Cozaar until your renal function is checked and continues to improve.    Disposition: home  Patient's condition: is Good  Follow up: 1. Dr. Trula Slade in 4 weeks with CTA 2. Neurology in 1-2 weeks for spine evaluation 3. Dr. Luan Pulling to have renal function checked to see if his renal function continues to be improved and start back on his Metformin and Cozaar.  [name], PA-C [facility] [phone] 04/14/2016  7:30 AM   - For VQI Registry use --- Instructions: Press F2 to tab through selections.  Delete question if not applicable.   Post-op:  Time to Extubation: '[x]'$  In OR, '[ ]'$  < 12 hrs, '[ ]'$  12-24 hrs, '[ ]'$  >=24 hrs Vasopressors Req. Post-op: No MI: No., '[ ]'$  Troponin only, '[ ]'$  EKG or Clinical New Arrhythmia: No CHF: No ICU Stay: 3 day in ICU Transfusion: No  If yes, n/a units given  Complications: Resp failure: No., '[ ]'$  Pneumonia, '[ ]'$  Ventilator Chg in renal function: Yes.  , [ x] Inc. Cr > 0.5 improved from 2.07 at peak to 1.43 at discharge, '[ ]'$  Temp. Dialysis, '[ ]'$  Permanent dialysis Leg ischemia: No., no Surgery needed, '[ ]'$  Yes, Surgery needed, '[ ]'$  Amputation Bowel ischemia: No., '[ ]'$  Medical Rx, '[ ]'$  Surgical Rx Wound complication: No., '[ ]'$  Superficial separation/infection, '[ ]'$  Return to OR Return to OR: return to [facility] lab for renal angioplasty and stenting Return to OR for bleeding: No Stroke: No., '[ ]'$  Minor, '[ ]'$  Major  Discharge medications: Statin use:  Yes If No: '[ ]'$  For Medical reasons, '[ ]'$  Non-compliant, '[ ]'$   Not-indicated ASA use:  No  If No: '[ ]'$  For Medical reasons, '[ ]'$  Non-compliant, '[ ]'$  Not-indicated Plavix use:  Yes If No: '[ ]'$  For Medical reasons, '[ ]'$  Non-compliant, '[ ]'$  Not-indicated Beta blocker use:  Yes If No: '[ ]'$  For Medical reasons, '[ ]'$  Non-compliant, '[ ]'$  Not-indicated

## 2016-04-14 NOTE — Discharge Instructions (Signed)
-  Stop taking your Metformin and Cozaar until you have your kidney function checked by Dr. Luan Pulling next week.  Call to make appointment today for next week. -elevate legs when you are not walking.

## 2016-04-14 NOTE — Progress Notes (Signed)
  Progress Note    04/14/2016 7:14 AM 4 Days Post-Op  Subjective:  Pt on BSC-spoke with him through the door-says his leg is feeling better.  Still has swelling  Afebrile HR  50's-70's NSR [id number]'T systolic 86%   Vitals:   82/57/49 2314 04/14/16 0325  BP: (!) 138/56 (!) 132/59  Pulse: 64 60  Resp: (!) 24 (!) 26  Temp: 98.4 F (36.9 C) 97.8 F (36.6 C)    Physical Exam: Unable to perform at this time.  CBC    Component Value Date/Time   WBC 9.3 04/14/2016 0303   RBC 3.91 (L) 04/14/2016 0303   HGB 12.7 (L) 04/14/2016 0303   HCT 36.5 (L) 04/14/2016 0303   PLT 127 (L) 04/14/2016 0303   MCV 93.4 04/14/2016 0303   MCH 32.5 04/14/2016 0303   MCHC 34.8 04/14/2016 0303   RDW 13.4 04/14/2016 0303    BMET    Component Value Date/Time   NA 135 04/14/2016 0303   K 3.2 (L) 04/14/2016 0303   CL 104 04/14/2016 0303   CO2 22 04/14/2016 0303   GLUCOSE 120 (H) 04/14/2016 0303   BUN 24 (H) 04/14/2016 0303   CREATININE 1.43 (H) 04/14/2016 0303   CREATININE 0.98 02/08/2016 1300   CALCIUM 8.5 (L) 04/14/2016 0303   GFRNONAA 46 (L) 04/14/2016 0303   GFRAA 54 (L) 04/14/2016 0303    INR    Component Value Date/Time   INR 1.08 04/09/2016 1837     Intake/Output Summary (Last 24 hours) at 04/14/16 0714 Last data filed at 04/14/16 0523  Gross per 24 hour  Intake              120 ml  Output             1300 ml  Net            -1180 ml     Assessment:  75 y.o. male is s/p:  Fenestrated endovascular stent graft repair of juxtarenal abdominal aortic aneurysm with left brachial access   4 Days Post-Op  Plan: -pt on BSC this am & unable to perform physical exam at this time.  Dr. Trula Slade will be by this morning. -his venous duplex of the LLE is negative for DVT.  Needs to keep legs elevated. -Creatinine continues to improve -anticipate he will dc home today pending PT recommendations. -rolling walker ordered for home -DVT prophylaxis:  Lovenox   [name],  PA-C [facility] [phone] 04/14/2016 7:14 AM

## 2016-04-14 NOTE — Progress Notes (Signed)
Physical Therapy Treatment Patient Details Name: Kevin Frank MRN: [id number] DOB: [birth year] Today's Date: 04/14/2016    History of Present Illness 75 yo admitted for justarenal AAA repair on 10/18 with left renal artery stent on 10/19. PMHx: BPH, COPD, DM, HTN, gout    PT Comments    Patient progressing well towards PT goals. Improved ambulation distance today without evidence of knee buckling. Continues to report decreased sensation LLE and pitting edema noted. Sp02 ranged 89-95% on RA throughout mobility. Balance improved with use of RW for support. Pt plans to discharge home with support of family/wife. Will follow if still in hospital.   Follow Up Recommendations  Home health PT;Supervision for mobility/OOB     Equipment Recommendations  Rolling walker with 5" wheels    Recommendations for Other Services       Precautions / Restrictions Precautions Precautions: Fall Restrictions Weight Bearing Restrictions: No    Mobility  Bed Mobility               General bed mobility comments: Sitting EOB upon PT arrival.   Transfers Overall transfer level: Needs assistance Equipment used: Rolling walker (2 wheeled) Transfers: Sit to/from Stand Sit to Stand: Supervision         General transfer comment: Supervision for safety. Stood from [employer]. Cues for technique.  Ambulation/Gait Ambulation/Gait assistance: Min guard Ambulation Distance (Feet): 400 Feet Assistive device: Rolling walker (2 wheeled);None Gait Pattern/deviations: Step-through pattern;Decreased stride length;Trunk flexed   Gait velocity interpretation: Below normal speed for age/gender General Gait Details: Slow, steady gait with RW; mildly unsteady without RW reaching for rail for support. Mild DOE. Vitals stable. Sp02 ranged from 89-95% on RA. HR 67-84 bpm. BPpost ambulation 157/73.   Stairs            Wheelchair Mobility    Modified Rankin (Stroke Patients Only)       Balance  Overall balance assessment: Needs assistance Sitting-balance support: Feet supported;No upper extremity supported Sitting balance-Leahy Scale: Good     Standing balance support: During functional activity Standing balance-Leahy Scale: Fair                      Cognition Arousal/Alertness: Awake/alert Behavior During Therapy: WFL for tasks assessed/performed Overall Cognitive Status: Within Functional Limits for tasks assessed                      Exercises      General Comments General comments (skin integrity, edema, etc.): Continues to have LLE swelling and tightness. Pitting edema 3+. Reports decreased sensation LLE and left foot but improved.       Pertinent Vitals/Pain Pain Assessment: No/denies pain    Home Living                      Prior Function            PT Goals (current goals can now be found in the care plan section) Progress towards PT goals: Progressing toward goals    Frequency    Min 3X/week      PT Plan Current plan remains appropriate    Co-evaluation             End of Session Equipment Utilized During Treatment: Gait belt Activity Tolerance: Patient tolerated treatment well Patient left: in bed;with call bell/phone within reach (sitting EOB.)     Time: 5732-2025 PT Time Calculation (min) (ACUTE ONLY): 16 min  Charges:  $  Gait Training: 8-22 mins                    G Codes:      Lacie Draft 04/14/2016, 10:25 AM  Wray Kearns, PT, DPT [phone]

## 2016-04-15 DIAGNOSIS — Z95828 Presence of other vascular implants and grafts: Secondary | ICD-10-CM | POA: Diagnosis not present

## 2016-04-15 DIAGNOSIS — F17210 Nicotine dependence, cigarettes, uncomplicated: Secondary | ICD-10-CM | POA: Diagnosis not present

## 2016-04-15 DIAGNOSIS — E119 Type 2 diabetes mellitus without complications: Secondary | ICD-10-CM | POA: Diagnosis not present

## 2016-04-15 DIAGNOSIS — M109 Gout, unspecified: Secondary | ICD-10-CM | POA: Diagnosis not present

## 2016-04-15 DIAGNOSIS — F329 Major depressive disorder, single episode, unspecified: Secondary | ICD-10-CM | POA: Diagnosis not present

## 2016-04-15 DIAGNOSIS — J449 Chronic obstructive pulmonary disease, unspecified: Secondary | ICD-10-CM | POA: Diagnosis not present

## 2016-04-15 DIAGNOSIS — Z7902 Long term (current) use of antithrombotics/antiplatelets: Secondary | ICD-10-CM | POA: Diagnosis not present

## 2016-04-15 DIAGNOSIS — I10 Essential (primary) hypertension: Secondary | ICD-10-CM | POA: Diagnosis not present

## 2016-04-16 ENCOUNTER — Telehealth: Payer: Self-pay | Admitting: Surgery

## 2016-04-16 NOTE — Telephone Encounter (Signed)
Spoke to pt, has CTA on 11/24 and f/u here 11/27

## 2016-04-16 NOTE — Telephone Encounter (Signed)
-----   Message from Mena Goes, RN sent at 04/14/2016 10:35 AM EDT ----- Regarding: 3 weeks w/ CTA    ----- Message ----- From: Gabriel Earing, PA-C Sent: 04/14/2016   7:26 AM To: Vvs Charge Pool  S/p FEVAR.  F/u with Dr. Trula Slade in 3 weeks with CTA protocol.  Thanks, Aldona Bar

## 2016-04-17 DIAGNOSIS — M109 Gout, unspecified: Secondary | ICD-10-CM | POA: Diagnosis not present

## 2016-04-17 DIAGNOSIS — E119 Type 2 diabetes mellitus without complications: Secondary | ICD-10-CM | POA: Diagnosis not present

## 2016-04-17 DIAGNOSIS — Z95828 Presence of other vascular implants and grafts: Secondary | ICD-10-CM | POA: Diagnosis not present

## 2016-04-17 DIAGNOSIS — F17210 Nicotine dependence, cigarettes, uncomplicated: Secondary | ICD-10-CM | POA: Diagnosis not present

## 2016-04-17 DIAGNOSIS — J449 Chronic obstructive pulmonary disease, unspecified: Secondary | ICD-10-CM | POA: Diagnosis not present

## 2016-04-17 DIAGNOSIS — F329 Major depressive disorder, single episode, unspecified: Secondary | ICD-10-CM | POA: Diagnosis not present

## 2016-04-17 DIAGNOSIS — I10 Essential (primary) hypertension: Secondary | ICD-10-CM | POA: Diagnosis not present

## 2016-04-17 DIAGNOSIS — Z7902 Long term (current) use of antithrombotics/antiplatelets: Secondary | ICD-10-CM | POA: Diagnosis not present

## 2016-04-19 DIAGNOSIS — Z95828 Presence of other vascular implants and grafts: Secondary | ICD-10-CM | POA: Diagnosis not present

## 2016-04-19 DIAGNOSIS — M109 Gout, unspecified: Secondary | ICD-10-CM | POA: Diagnosis not present

## 2016-04-19 DIAGNOSIS — I10 Essential (primary) hypertension: Secondary | ICD-10-CM | POA: Diagnosis not present

## 2016-04-19 DIAGNOSIS — E119 Type 2 diabetes mellitus without complications: Secondary | ICD-10-CM | POA: Diagnosis not present

## 2016-04-19 DIAGNOSIS — J449 Chronic obstructive pulmonary disease, unspecified: Secondary | ICD-10-CM | POA: Diagnosis not present

## 2016-04-19 DIAGNOSIS — F17210 Nicotine dependence, cigarettes, uncomplicated: Secondary | ICD-10-CM | POA: Diagnosis not present

## 2016-04-19 DIAGNOSIS — F329 Major depressive disorder, single episode, unspecified: Secondary | ICD-10-CM | POA: Diagnosis not present

## 2016-04-19 DIAGNOSIS — Z7902 Long term (current) use of antithrombotics/antiplatelets: Secondary | ICD-10-CM | POA: Diagnosis not present

## 2016-04-21 DIAGNOSIS — Z7902 Long term (current) use of antithrombotics/antiplatelets: Secondary | ICD-10-CM | POA: Diagnosis not present

## 2016-04-21 DIAGNOSIS — Z95828 Presence of other vascular implants and grafts: Secondary | ICD-10-CM | POA: Diagnosis not present

## 2016-04-21 DIAGNOSIS — J449 Chronic obstructive pulmonary disease, unspecified: Secondary | ICD-10-CM | POA: Diagnosis not present

## 2016-04-21 DIAGNOSIS — F17210 Nicotine dependence, cigarettes, uncomplicated: Secondary | ICD-10-CM | POA: Diagnosis not present

## 2016-04-21 DIAGNOSIS — E119 Type 2 diabetes mellitus without complications: Secondary | ICD-10-CM | POA: Diagnosis not present

## 2016-04-21 DIAGNOSIS — F329 Major depressive disorder, single episode, unspecified: Secondary | ICD-10-CM | POA: Diagnosis not present

## 2016-04-21 DIAGNOSIS — M109 Gout, unspecified: Secondary | ICD-10-CM | POA: Diagnosis not present

## 2016-04-21 DIAGNOSIS — I10 Essential (primary) hypertension: Secondary | ICD-10-CM | POA: Diagnosis not present

## 2016-04-22 DIAGNOSIS — J449 Chronic obstructive pulmonary disease, unspecified: Secondary | ICD-10-CM | POA: Diagnosis not present

## 2016-04-22 DIAGNOSIS — E119 Type 2 diabetes mellitus without complications: Secondary | ICD-10-CM | POA: Diagnosis not present

## 2016-04-22 DIAGNOSIS — F17200 Nicotine dependence, unspecified, uncomplicated: Secondary | ICD-10-CM | POA: Diagnosis not present

## 2016-04-22 DIAGNOSIS — I10 Essential (primary) hypertension: Secondary | ICD-10-CM | POA: Diagnosis not present

## 2016-04-24 DIAGNOSIS — E119 Type 2 diabetes mellitus without complications: Secondary | ICD-10-CM | POA: Diagnosis not present

## 2016-04-24 DIAGNOSIS — I10 Essential (primary) hypertension: Secondary | ICD-10-CM | POA: Diagnosis not present

## 2016-04-24 DIAGNOSIS — Z95828 Presence of other vascular implants and grafts: Secondary | ICD-10-CM | POA: Diagnosis not present

## 2016-04-24 DIAGNOSIS — Z7902 Long term (current) use of antithrombotics/antiplatelets: Secondary | ICD-10-CM | POA: Diagnosis not present

## 2016-04-24 DIAGNOSIS — F17210 Nicotine dependence, cigarettes, uncomplicated: Secondary | ICD-10-CM | POA: Diagnosis not present

## 2016-04-24 DIAGNOSIS — J449 Chronic obstructive pulmonary disease, unspecified: Secondary | ICD-10-CM | POA: Diagnosis not present

## 2016-04-24 DIAGNOSIS — F329 Major depressive disorder, single episode, unspecified: Secondary | ICD-10-CM | POA: Diagnosis not present

## 2016-04-24 DIAGNOSIS — M109 Gout, unspecified: Secondary | ICD-10-CM | POA: Diagnosis not present

## 2016-04-28 DIAGNOSIS — Z7902 Long term (current) use of antithrombotics/antiplatelets: Secondary | ICD-10-CM | POA: Diagnosis not present

## 2016-04-28 DIAGNOSIS — M109 Gout, unspecified: Secondary | ICD-10-CM | POA: Diagnosis not present

## 2016-04-28 DIAGNOSIS — F329 Major depressive disorder, single episode, unspecified: Secondary | ICD-10-CM | POA: Diagnosis not present

## 2016-04-28 DIAGNOSIS — J449 Chronic obstructive pulmonary disease, unspecified: Secondary | ICD-10-CM | POA: Diagnosis not present

## 2016-04-28 DIAGNOSIS — Z95828 Presence of other vascular implants and grafts: Secondary | ICD-10-CM | POA: Diagnosis not present

## 2016-04-28 DIAGNOSIS — F17210 Nicotine dependence, cigarettes, uncomplicated: Secondary | ICD-10-CM | POA: Diagnosis not present

## 2016-04-28 DIAGNOSIS — E119 Type 2 diabetes mellitus without complications: Secondary | ICD-10-CM | POA: Diagnosis not present

## 2016-04-28 DIAGNOSIS — I10 Essential (primary) hypertension: Secondary | ICD-10-CM | POA: Diagnosis not present

## 2016-04-29 DIAGNOSIS — G4733 Obstructive sleep apnea (adult) (pediatric): Secondary | ICD-10-CM | POA: Diagnosis not present

## 2016-05-01 DIAGNOSIS — I10 Essential (primary) hypertension: Secondary | ICD-10-CM | POA: Diagnosis not present

## 2016-05-01 DIAGNOSIS — M109 Gout, unspecified: Secondary | ICD-10-CM | POA: Diagnosis not present

## 2016-05-01 DIAGNOSIS — Z7902 Long term (current) use of antithrombotics/antiplatelets: Secondary | ICD-10-CM | POA: Diagnosis not present

## 2016-05-01 DIAGNOSIS — E119 Type 2 diabetes mellitus without complications: Secondary | ICD-10-CM | POA: Diagnosis not present

## 2016-05-01 DIAGNOSIS — J449 Chronic obstructive pulmonary disease, unspecified: Secondary | ICD-10-CM | POA: Diagnosis not present

## 2016-05-01 DIAGNOSIS — Z95828 Presence of other vascular implants and grafts: Secondary | ICD-10-CM | POA: Diagnosis not present

## 2016-05-01 DIAGNOSIS — F329 Major depressive disorder, single episode, unspecified: Secondary | ICD-10-CM | POA: Diagnosis not present

## 2016-05-01 DIAGNOSIS — F17210 Nicotine dependence, cigarettes, uncomplicated: Secondary | ICD-10-CM | POA: Diagnosis not present

## 2016-05-07 ENCOUNTER — Encounter: Payer: Self-pay | Admitting: Surgery

## 2016-05-07 ENCOUNTER — Telehealth: Payer: Self-pay

## 2016-05-07 DIAGNOSIS — Z95820 Peripheral vascular angioplasty status with implants and grafts: Secondary | ICD-10-CM

## 2016-05-07 DIAGNOSIS — Z8679 Personal history of other diseases of the circulatory system: Secondary | ICD-10-CM

## 2016-05-07 DIAGNOSIS — Z9889 Other specified postprocedural states: Principal | ICD-10-CM

## 2016-05-07 DIAGNOSIS — Z48812 Encounter for surgical aftercare following surgery on the circulatory system: Secondary | ICD-10-CM

## 2016-05-07 NOTE — Telephone Encounter (Signed)
Pt called to report he has had a soreness in the left lower abdomen between base of rib cage and groin x 3-4 days. Reported increased soreness when he gets up and down, or when turning toward the left side.  Reported he has had constipation since surgery, and is using Miralax, with small bowel movements.  Denied nausea/ vomiting.  Denied fever/ chills.  Asking if Dr. Trula Slade wants to see him sooner than 11/27?  Advised will discuss with Dr. Trula Slade tomorrow, and will call back with any recommendations.  Agreed.

## 2016-05-08 NOTE — Telephone Encounter (Signed)
Spoke with pt. this AM and stated he is feeling better today, after taking it easy yesterday.  Also, reported BUN 22, and Creatinine 1.36 on 10/31 at Dr. Kathaleen Grinder office.  Discussed pt's symptoms with Dr. Trula Slade.  Advised to schedule pt's. CTA Chest, Abd., and Pelvis with reduced contrast, and to have pt. Hydrate well the day prior, the day of scan, and the day following the scan, and to schedule for Bilateral Renal duplex, prior to f/u appt. On 11/27.

## 2016-05-08 NOTE — Telephone Encounter (Signed)
Spoke to [facility] sching and added the CTA Chest to his CTA Abd/Pel on 05/16/16. Sched renal bilat on 05/14/16 at 8:00. Spoke to pt's wife to inform her of changes.

## 2016-05-08 NOTE — Telephone Encounter (Signed)
Contacted pt. Re: plan to schedule a Renal US prior to appt. With Dr. Trula Slade 11/27.  Also instructed on importance of hydrating well the day prior to CTA (sched. 11/24 @ [facility]), the day of, and the day following the scan; encouraged to drink decaffeinated beverages to effectively hydrate.  Pt. Verb. Understanding.

## 2016-05-08 NOTE — Telephone Encounter (Signed)
Contacted [facility], CT Dept.; spoke with Anderson Malta.  Advised that Dr. Trula Slade is requesting to perform the CTA chest, abdomen, and pelvis with reduced contrast for Creatinine of 1.36.  Per Anderson Malta, she will note the instructions on the order.  Was informed per Anderson Malta that a CTA chest order has not been placed.  Advised will place this order now.

## 2016-05-13 ENCOUNTER — Ambulatory Visit (HOSPITAL_COMMUNITY)
Admission: RE | Admit: 2016-05-13 | Discharge: 2016-05-13 | Disposition: A | Discharge status: Home / Self Care | Payer: Commercial Managed Care - HMO | Source: Ambulatory Visit | Attending: Surgery | Admitting: Surgery

## 2016-05-13 DIAGNOSIS — Z48812 Encounter for surgical aftercare following surgery on the circulatory system: Secondary | ICD-10-CM

## 2016-05-13 DIAGNOSIS — Z959 Presence of cardiac and vascular implant and graft, unspecified: Secondary | ICD-10-CM

## 2016-05-13 DIAGNOSIS — Z95820 Peripheral vascular angioplasty status with implants and grafts: Secondary | ICD-10-CM | POA: Insufficient documentation

## 2016-05-13 DIAGNOSIS — H903 Sensorineural hearing loss, bilateral: Secondary | ICD-10-CM | POA: Diagnosis not present

## 2016-05-14 ENCOUNTER — Encounter (HOSPITAL_COMMUNITY): Payer: Commercial Managed Care - HMO

## 2016-05-16 ENCOUNTER — Ambulatory Visit (HOSPITAL_COMMUNITY)
Admission: RE | Admit: 2016-05-16 | Discharge: 2016-05-16 | Disposition: A | Discharge status: Home / Self Care | Payer: Commercial Managed Care - HMO | Source: Ambulatory Visit | Attending: Vascular Surgery | Admitting: Vascular Surgery

## 2016-05-16 DIAGNOSIS — I700 Atherosclerosis of aorta: Secondary | ICD-10-CM | POA: Diagnosis not present

## 2016-05-16 DIAGNOSIS — Z95828 Presence of other vascular implants and grafts: Secondary | ICD-10-CM | POA: Insufficient documentation

## 2016-05-16 DIAGNOSIS — K5730 Diverticulosis of large intestine without perforation or abscess without bleeding: Secondary | ICD-10-CM | POA: Diagnosis not present

## 2016-05-16 DIAGNOSIS — I714 Abdominal aortic aneurysm, without rupture, unspecified: Secondary | ICD-10-CM

## 2016-05-16 DIAGNOSIS — R109 Unspecified abdominal pain: Secondary | ICD-10-CM | POA: Diagnosis not present

## 2016-05-16 DIAGNOSIS — Z9889 Other specified postprocedural states: Secondary | ICD-10-CM

## 2016-05-16 DIAGNOSIS — Z48812 Encounter for surgical aftercare following surgery on the circulatory system: Secondary | ICD-10-CM

## 2016-05-16 DIAGNOSIS — R0781 Pleurodynia: Secondary | ICD-10-CM | POA: Diagnosis not present

## 2016-05-16 DIAGNOSIS — Z8679 Personal history of other diseases of the circulatory system: Secondary | ICD-10-CM

## 2016-05-16 MED ORDER — IOPAMIDOL (ISOVUE-370) INJECTION 76%
100.0000 mL | Freq: Once | INTRAVENOUS | Status: AC | PRN
  Administered 2016-05-16: 80 mL via INTRAVENOUS

## 2016-05-19 ENCOUNTER — Encounter: Payer: Self-pay | Admitting: Surgery

## 2016-05-19 ENCOUNTER — Ambulatory Visit (INDEPENDENT_AMBULATORY_CARE_PROVIDER_SITE_OTHER): Payer: Commercial Managed Care - HMO | Admitting: Surgery

## 2016-05-19 VITALS — BP 138/80 | HR 62 | Temp 99.1°F | Resp 18 | Ht 70.0 in | Wt 223.2 lb

## 2016-05-19 DIAGNOSIS — I714 Abdominal aortic aneurysm, without rupture, unspecified: Secondary | ICD-10-CM

## 2016-05-19 DIAGNOSIS — Z9862 Peripheral vascular angioplasty status: Secondary | ICD-10-CM

## 2016-05-19 NOTE — Addendum Note (Signed)
Addended by: Lianne Cure A on: 05/19/2016 10:34 AM   Modules accepted: Orders

## 2016-05-19 NOTE — Progress Notes (Signed)
Patient name: Kevin Frank MRN: [id number] DOB: [birth year] Sex: male  REASON FOR VISIT: Follow-up FEVAR  HPI: Kevin Frank is a 75 y.o. male returns today for his first postoperative follow-up.  On 04/09/2016 he underwent FEVAR with a single right renal fenestration.  I also performed left renal angioplasty at the time of the main body deployment service to ensure that the graft not cover the renal artery because of angulation.  The day following his procedure, his creatinine increased and ultrasound did not show good perfusion to the left kidney and therefore he went to the Cath Lab and had stenting of his left renal artery.  His creatinine trended down and his urine output improved.  The patient also had issues with left leg numbness in his legs after the procedure.  Neurology was consulted.  It was felt this was most likely secondary to lower back disease that was aggravated during surgery.  The patient is here today without any significant complaints.  His left toes are slightly non-but this does not bother him.  He is eager to get back to normal activity.  Current Outpatient Prescriptions  Medication Sig Dispense Refill  . albuterol (PROVENTIL HFA;VENTOLIN HFA) 108 (90 BASE) MCG/ACT inhaler Inhale 2 puffs into the lungs every 6 (six) hours as needed.    [name] allopurinol (ZYLOPRIM) 300 MG tablet Take 300 mg by mouth daily.      [name] amLODipine (NORVASC) 10 MG tablet Take 1 tablet (10 mg total) by mouth daily. 90 tablet 2  . atorvastatin (LIPITOR) 20 MG tablet Take 20 mg by mouth daily.     [name] azelastine (ASTELIN) 0.1 % nasal spray Place 2 sprays into both nostrils 2 (two) times daily.    . budesonide-formoterol (SYMBICORT) 160-4.5 MCG/ACT inhaler Inhale 2 puffs into the lungs 2 (two) times daily. 3 Inhaler 1  . clopidogrel (PLAVIX) 75 MG tablet Take 1 tablet (75 mg total) by mouth daily. 30 tablet 6  . furosemide (LASIX) 40 MG tablet Take 40 mg by mouth  daily.      . metoprolol succinate (TOPROL-XL) 25 MG 24 hr tablet Take 1 tablet (25 mg total) by mouth daily. 90 tablet 2  . sertraline (ZOLOFT) 50 MG tablet Take 50 mg by mouth daily.      [name] SPIRIVA HANDIHALER 18 MCG inhalation capsule Place 18 mcg into inhaler and inhale daily.     [name] oxyCODONE-acetaminophen (PERCOCET/ROXICET) 5-325 MG tablet Take 1 tablet by mouth every 6 (six) hours as needed for moderate pain. (Patient not taking: Reported on 05/19/2016) 10 tablet 0   Current Facility-Administered Medications  Medication Dose Route Frequency Provider Last Rate Last Dose  . ipratropium (ATROVENT) nebulizer solution 0.5 mg  0.5 mg Nebulization Once Roselyn Malachy Moan, MD      . levalbuterol Penne Lash) nebulizer solution 1.25 mg  1.25 mg Nebulization Once Roselyn Malachy Moan, MD        REVIEW OF SYSTEMS:  '[X]'$  denotes positive finding, '[ ]'$  denotes negative finding Cardiac  Comments:  Chest pain or chest pressure:    Shortness of breath upon exertion:    Short of breath when lying flat:    Irregular heart rhythm:    Constitutional    Fever or chills:      PHYSICAL EXAM: Vitals:   05/19/16 0929  BP: 138/80  Pulse: 62  Resp: 18  Temp: 99.1 F (37.3 C)  TempSrc: Oral  SpO2: 93%  Weight: 223 lb 3.2 oz (101.2 kg)  Height: '5\' 10"'$  (1.778 m)    GENERAL: The patient is a well-nourished male, in no acute distress. The vital signs are documented above. CARDIOVASCULAR: There is a regular rate and rhythm. PULMONARY: There is good air exchange bilaterally without wheezing or rales. Palpable femoral pulses.  Palpable left brachial pulse  MEDICAL ISSUES: CT angiogram shows no evidence of endoleak and widely patent renal artery stents bilaterally.  Overall, he has recovered extremely well.  Very pleased with his progress.  His most recent creatinine was 1.4.  I have scheduled for follow-up in 6 months with an aortoiliac duplex and a bilateral renal artery duplex.  Annamarie Major, MD [facility] [phone] Pager [phone]

## 2016-05-20 ENCOUNTER — Ambulatory Visit: Payer: Commercial Managed Care - HMO | Admitting: Nurse Practitioner

## 2016-05-28 ENCOUNTER — Other Ambulatory Visit: Payer: Self-pay | Admitting: Cardiovascular Disease

## 2016-05-29 DIAGNOSIS — G4733 Obstructive sleep apnea (adult) (pediatric): Secondary | ICD-10-CM | POA: Diagnosis not present

## 2016-06-02 DIAGNOSIS — L570 Actinic keratosis: Secondary | ICD-10-CM | POA: Diagnosis not present

## 2016-06-03 DIAGNOSIS — J449 Chronic obstructive pulmonary disease, unspecified: Secondary | ICD-10-CM | POA: Diagnosis not present

## 2016-06-03 DIAGNOSIS — I701 Atherosclerosis of renal artery: Secondary | ICD-10-CM | POA: Diagnosis not present

## 2016-06-03 DIAGNOSIS — I10 Essential (primary) hypertension: Secondary | ICD-10-CM | POA: Diagnosis not present

## 2016-06-03 DIAGNOSIS — G4733 Obstructive sleep apnea (adult) (pediatric): Secondary | ICD-10-CM | POA: Diagnosis not present

## 2016-06-26 DIAGNOSIS — R269 Unspecified abnormalities of gait and mobility: Secondary | ICD-10-CM | POA: Diagnosis not present

## 2016-06-26 DIAGNOSIS — G4733 Obstructive sleep apnea (adult) (pediatric): Secondary | ICD-10-CM | POA: Diagnosis not present

## 2016-06-26 DIAGNOSIS — I714 Abdominal aortic aneurysm, without rupture: Secondary | ICD-10-CM | POA: Diagnosis not present

## 2016-06-29 DIAGNOSIS — G4733 Obstructive sleep apnea (adult) (pediatric): Secondary | ICD-10-CM | POA: Diagnosis not present

## 2016-07-30 ENCOUNTER — Encounter: Payer: Self-pay | Admitting: *Deleted

## 2016-07-30 DIAGNOSIS — G4733 Obstructive sleep apnea (adult) (pediatric): Secondary | ICD-10-CM | POA: Diagnosis not present

## 2016-08-11 ENCOUNTER — Encounter: Payer: Self-pay | Admitting: Nurse Practitioner

## 2016-08-11 ENCOUNTER — Encounter (INDEPENDENT_AMBULATORY_CARE_PROVIDER_SITE_OTHER): Payer: Self-pay

## 2016-08-11 ENCOUNTER — Ambulatory Visit (INDEPENDENT_AMBULATORY_CARE_PROVIDER_SITE_OTHER): Payer: Medicare HMO | Admitting: Nurse Practitioner

## 2016-08-11 VITALS — BP 170/80 | HR 77 | Ht 70.0 in | Wt 233.8 lb

## 2016-08-11 DIAGNOSIS — F17200 Nicotine dependence, unspecified, uncomplicated: Secondary | ICD-10-CM | POA: Diagnosis not present

## 2016-08-11 DIAGNOSIS — I10 Essential (primary) hypertension: Secondary | ICD-10-CM | POA: Diagnosis not present

## 2016-08-11 NOTE — Progress Notes (Signed)
CARDIOLOGY OFFICE NOTE  Date:  08/11/2016    Kevin Frank Date of Birth: February 13, 1941 Medical Record #[id number]  PCP:  Alonza Bogus, MD  Cardiologist:  Jerel Shepherd   Chief Complaint  Patient presents with  . Hypertension  . Hyperlipidemia    6 month check - seen for Dr. Burt Knack    History of Present Illness: Kevin Frank is a 76 y.o. male who presents today for a follow up visit.  This is a 6 month check. Seen for Dr. Burt Knack. He is a former patient of Dr. Susa Simmonds.   He has a history of hypertension, hyperlipidemia, DM, tobacco abuse, COPD, and abdominal aortic aneurysm. The patient's aneurysm surveillance is performed by [facility].  At his recent assessment his AAA measured 4.9 cm - this was mildly increased from prior study. He is scheduled for another study in 6 months. He continues to smoke cigarettes. He has not had any known CAD.   Last seen back in September of 2016 by Dr. Burt Knack. Cardiac status was stable. Does have shortness of breath - continues to smoke - not interested in stopping.   I last saw him back in August - he was doing ok - needing to get his AAA repaired - still smoking. Pretty active despite his COPD. Recent CT with a lung nodule noted - this turned out ok.   On 04/09/2016 he underwent FEVAR with a single right renal fenestration by Dr. Trula Slade. He also had left renal angioplasty at the time of the main body deployment service to ensure that the graft not cover the renal artery because of angulation.  The day following his procedure, his creatinine increased and ultrasound did not show good perfusion to the left kidney and therefore he went to the Cath Lab and had stenting of his left renal artery.  His creatinine trended down and his urine output improved.  The patient also had issues with left leg numbness in his legs after the procedure.  Neurology was consulted.  It was felt this was most likely secondary to lower back disease that was  aggravated during surgery.  Comes in today. Here alone. He is doing well. He has his labs with PCP. He has no chest pain. Breathing is stable. Energy level is good. 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 Still smoking - no intent to stop.   Past Medical History:  Diagnosis Date  . AAA (abdominal aortic aneurysm) ([facility]) 01/2008   3.2cm  . BPH (benign prostatic hyperplasia)   . COPD (chronic obstructive pulmonary disease) ([place])   . Diabetes mellitus    type II  . Gout   . History of kidney stones   . Hypercholesterolemia   . Hyperlipidemia   . Hypertension   . Normal nuclear stress test 2011  . Obesity   . Obstructive sleep apnea on CPAP   . Presence of indwelling urinary catheter   . Shortness of breath    due to cigarette abuse  . Tobacco abuse     Past Surgical History:  Procedure Laterality Date  . ABDOMINAL AORTIC ENDOVASCULAR FENESTRATED STENT GRAFT N/A 04/09/2016   Procedure: ABDOMINAL AORTIC ENDOVASCULAR FENESTRATED STENT GRAFT with left brachial artery access using ultrasound guidance;  Surgeon: Serafina Mitchell, MD;  Location: [facility];  Service: Vascular;  Laterality: N/A;  . [place]   [place]  . PERIPHERAL VASCULAR CATHETERIZATION N/A 04/10/2016   Procedure: Renal Angiography;  Surgeon: Angelia Mould, MD;  Location: [facility];  Service: Cardiovascular;  Laterality: N/A;  . TRANSURETHRAL RESECTION OF PROSTATE  01/07/2011   Procedure: TRANSURETHRAL RESECTION OF THE PROSTATE (TURP);  Surgeon: Marissa Nestle;  Location: [facility];  Service: Urology;  Laterality: N/A;     Medications: Current Outpatient Prescriptions  Medication Sig Dispense Refill  . albuterol (PROVENTIL HFA;VENTOLIN HFA) 108 (90 BASE) MCG/ACT inhaler Inhale 2 puffs into the lungs every 6 (six) hours as needed (rescue inhaler).     [name] allopurinol (ZYLOPRIM) 300 MG tablet Take 300 mg by mouth daily.      [name] amLODipine (NORVASC) 10 MG tablet TAKE 1 TABLET (10 MG TOTAL)  BY MOUTH DAILY. 90 tablet 2  . atorvastatin (LIPITOR) 20 MG tablet Take 20 mg by mouth daily.     [name] azelastine (ASTELIN) 0.1 % nasal spray Place 2 sprays into both nostrils 2 (two) times daily.    . budesonide-formoterol (SYMBICORT) 160-4.5 MCG/ACT inhaler Inhale 2 puffs into the lungs 2 (two) times daily. 3 Inhaler 1  . clopidogrel (PLAVIX) 75 MG tablet Take 1 tablet (75 mg total) by mouth daily. 30 tablet 6  . furosemide (LASIX) 40 MG tablet Take 40 mg by mouth daily.      [name] losartan (COZAAR) 50 MG tablet Take 50 mg by mouth daily.     . metFORMIN (GLUCOPHAGE) 500 MG tablet Take 500 mg by mouth 2 (two) times daily with a meal.     . metoprolol succinate (TOPROL-XL) 25 MG 24 hr tablet TAKE 1 TABLET (25 MG TOTAL) BY MOUTH DAILY. 90 tablet 2  . oxyCODONE-acetaminophen (PERCOCET/ROXICET) 5-325 MG tablet Take 1 tablet by mouth every 6 (six) hours as needed for moderate pain. 10 tablet 0  . sertraline (ZOLOFT) 50 MG tablet Take 50 mg by mouth daily.      [name] SPIRIVA HANDIHALER 18 MCG inhalation capsule Place 18 mcg into inhaler and inhale daily.      Current Facility-Administered Medications  Medication Dose Route Frequency Provider Last Rate Last Dose  . ipratropium (ATROVENT) nebulizer solution 0.5 mg  0.5 mg Nebulization Once Roselyn Malachy Moan, MD      . levalbuterol Penne Lash) nebulizer solution 1.25 mg  1.25 mg Nebulization Once Roselyn Malachy Moan, MD        Allergies: Allergies  Allergen Reactions  . No Known Allergies     Social History: The patient  reports that he has been smoking Cigarettes.  He has a 100.00 pack-year smoking history. He has never used smokeless tobacco. He reports that he drinks alcohol. He reports that he does not use drugs.   Family History: The patient's family history includes Cancer in his mother; Diabetes in his mother; Hypertension in his mother; Liver disease in his father; Lymphoma in his mother.   Review of Systems: Please see the history of present illness.    Otherwise, the review of systems is positive for none.   All other systems are reviewed and negative.   Physical Exam: VS:  BP (!) 170/80   Pulse 77   Ht '5\' 10"'$  (1.778 m)   Wt 233 lb 12.8 oz (106.1 kg)   SpO2 93%   BMI 33.55 kg/m  .  BMI Body mass index is 33.55 kg/m.  Wt Readings from Last 3 Encounters:  08/11/16 233 lb 12.8 oz (106.1 kg)  05/19/16 223 lb 3.2 oz (101.2 kg)  04/12/16 232 lb 8 oz (105.5 kg)   BP recheck by me is 130/80  General: Pleasant.  Elderly male - looks older than stated age. He is alert and in no acute distress.   HEENT: Normal but with poor dentition. He is hard of hearing.  Neck: Supple, no JVD, carotid bruits, or masses noted.  Cardiac: Regular rate and rhythm. No murmurs, rubs, or gallops. No edema.  Respiratory:  Lungs with diffuse wheezes and with normal work of breathing.  GI: Soft and nontender.  MS: No deformity or atrophy. Gait and ROM intact.  Skin: Warm and dry. Color is normal.  Neuro:  Strength and sensation are intact and no gross focal deficits noted.  Psych: Alert, appropriate and with normal affect.   LABORATORY DATA:  EKG:  EKG is not ordered today.  Lab Results  Component Value Date   WBC 9.3 04/14/2016   HGB 12.7 (L) 04/14/2016   HCT 36.5 (L) 04/14/2016   PLT 127 (L) 04/14/2016   GLUCOSE 120 (H) 04/14/2016   CHOL 138 12/16/2010   TRIG 53.0 12/16/2010   HDL 46.10 12/16/2010   LDLCALC 81 12/16/2010   ALT 23 04/02/2016   AST 18 04/02/2016   NA 135 04/14/2016   K 3.2 (L) 04/14/2016   CL 104 04/14/2016   CREATININE 1.43 (H) 04/14/2016   BUN 24 (H) 04/14/2016   CO2 22 04/14/2016   INR 1.08 04/09/2016   HGBA1C 6.0 (H) 04/02/2016    BNP (last 3 results) No results for input(s): BNP in the last 8760 hours.  ProBNP (last 3 results) No results for input(s): PROBNP in the last 8760 hours.   Other Studies Reviewed Today:  Myoview Study Highlights from 02/2016    Nuclear stress EF: 58%.  There was no ST segment  deviation noted during stress.  The study is normal.  This is a low risk study.    Echo Study Conclusions from 02/2016  - Left ventricle: The cavity size was normal. Wall thickness was   increased in a pattern of mild LVH. Systolic function was normal.   The estimated ejection fraction was in the range of 60% to 65%.   Wall motion was normal; there were no regional wall motion   abnormalities. Features are consistent with a pseudonormal left   ventricular filling pattern, with concomitant abnormal relaxation   and increased filling pressure (grade 2 diastolic dysfunction).   Doppler parameters are consistent with high ventricular filling   pressure. - Mitral valve: There was mild regurgitation. - Left atrium: The atrium was moderately dilated. - Pulmonary arteries: Systolic pressure was mildly increased. PA   peak pressure: 37 mm Hg (S).  Impressions:  - Normal LV systolic function; grade 2 diastolic dysfunction with   elevated LV filling pressure; mild MR; moderate LAE; trace TR   with mildly elevated pulmonary pressure.  CT ANGIO CHEST/ABD/PELVIS IMPRESSION: 1. Patent suprarenal bifurcated aortic stent graft without endoleak, native sac diameter 5.3 cm. 2. Coronary and Aortic Atherosclerosis (ICD10-170.0) without dissection. 3. Descending and sigmoid  diverticulosis.   Electronically Signed   By: Lucrezia Europe M.D.   On: 05/16/2016 11:02   Assessment/Plan:  1.HTN - BP ok on current regimen for now after recheck by me.   2. HLD - on statin - labs checked by PCP  3. Long standing tobacco - not interested in stopping.   4. Prior AAA/FEVAR repair with stenting of the left renal artery - followed by [facility].   Current medicines are reviewed with the patient today.  The patient does not have concerns regarding medicines other than what has been  noted above.  The following changes have been made:  See above.  Labs/ tests ordered today include:   No orders of the  defined types were placed in this encounter.    Disposition:   FU with me in 6 months.   Patient is agreeable to this plan and will call if any problems develop in the interim.   SignedTruitt Merle, NP  08/11/2016 10:51 AM  [facility] [address] [place], [place]  [address] Phone: [phone] Fax: [phone]

## 2016-08-11 NOTE — Patient Instructions (Addendum)
We will be checking the following labs today - NONE   Medication Instructions:    Continue with your current medicines.     Testing/Procedures To Be Arranged:  N/A  Follow-Up:   See me in 6 months.    Other Special Instructions:   N/A    If you need a refill on your cardiac medications before your next appointment, please call your pharmacy.   Call the [facility] office at [phone] if you have any questions, problems or concerns.      

## 2016-08-22 ENCOUNTER — Ambulatory Visit (HOSPITAL_COMMUNITY)
Admission: RE | Admit: 2016-08-22 | Discharge: 2016-08-22 | Disposition: A | Discharge status: Home / Self Care | Payer: Medicare HMO | Source: Ambulatory Visit | Attending: Internal Medicine | Admitting: Internal Medicine

## 2016-08-22 DIAGNOSIS — J439 Emphysema, unspecified: Secondary | ICD-10-CM | POA: Diagnosis not present

## 2016-08-22 DIAGNOSIS — R911 Solitary pulmonary nodule: Secondary | ICD-10-CM | POA: Insufficient documentation

## 2016-08-22 DIAGNOSIS — F17210 Nicotine dependence, cigarettes, uncomplicated: Secondary | ICD-10-CM | POA: Insufficient documentation

## 2016-08-22 MED ORDER — ALBUTEROL SULFATE (2.5 MG/3ML) 0.083% IN NEBU
2.5000 mg | INHALATION_SOLUTION | Freq: Once | RESPIRATORY_TRACT | Status: AC
  Administered 2016-08-22: 2.5 mg via RESPIRATORY_TRACT

## 2016-08-27 DIAGNOSIS — G4733 Obstructive sleep apnea (adult) (pediatric): Secondary | ICD-10-CM | POA: Diagnosis not present

## 2016-08-27 LAB — PULMONARY FUNCTION TEST
DL/VA % pred: 53 %
DL/VA: 2.44 ml/min/mmHg/L
DLCO COR % PRED: 44 %
DLCO cor: 14.48 ml/min/mmHg
DLCO unc % pred: 44 %
DLCO unc: 14.48 ml/min/mmHg
FEF 25-75 POST: 1.18 L/s
FEF 25-75 Pre: 0.79 L/sec
FEF2575-%Change-Post: 50 %
FEF2575-%PRED-POST: 54 %
FEF2575-%Pred-Pre: 35 %
FEV1-%CHANGE-POST: 15 %
FEV1-%Pred-Post: 67 %
FEV1-%Pred-Pre: 58 %
FEV1-Post: 2.06 L
FEV1-Pre: 1.79 L
FEV1FVC-%Change-Post: -2 %
FEV1FVC-%PRED-PRE: 82 %
FEV6-%Change-Post: 22 %
FEV6-%Pred-Post: 87 %
FEV6-%Pred-Pre: 71 %
FEV6-POST: 3.47 L
FEV6-PRE: 2.83 L
FEV6FVC-%Change-Post: 4 %
FEV6FVC-%PRED-POST: 103 %
FEV6FVC-%Pred-Pre: 99 %
FVC-%CHANGE-POST: 17 %
FVC-%PRED-POST: 84 %
FVC-%PRED-PRE: 71 %
FVC-POST: 3.56 L
FVC-PRE: 3.03 L
PRE FEV6/FVC RATIO: 93 %
Post FEV1/FVC ratio: 58 %
Post FEV6/FVC ratio: 97 %
Pre FEV1/FVC ratio: 59 %
RV % pred: 206 %
RV: 5.31 L
TLC % PRED: 127 %
TLC: 8.94 L

## 2016-09-12 ENCOUNTER — Telehealth: Payer: Self-pay | Admitting: *Deleted

## 2016-09-12 NOTE — Telephone Encounter (Signed)
Patient had called 09/11/2016 inquiring if he should hold plavix before a dental appointment.  I called the patient 09/11/16 and left a message for him to return the call. I did not hear from the patient today so I called him again and left a voice message for him to return the call Monday if he still needed clarification on this matter.

## 2016-09-24 ENCOUNTER — Telehealth: Payer: Self-pay | Admitting: Surgery

## 2016-09-24 NOTE — Telephone Encounter (Signed)
Phone call to [facility] office.  Spoke with Receptionist.  Stated a form was sent to our office to be signed by the MD re: recommendation for pt's blood thinner, prior to dental work on 09/02/16.  Stated she will send another form to be signed.  Informed the Receptionist will review with Dr. Trula Slade, and send back next week, as he is out of the office this week.  Agreed.

## 2016-09-24 NOTE — Telephone Encounter (Signed)
Patient walked in inquiring if Dr. Trula Slade will approve holding his plavix for his dental surgery. The patient stated that he called a few weeks ago and hasn't heard anything. There is a note by Katy Apo on 3/23 that she has attempted to reach the patient. I obtained the dentist's contact info ([facility] [phone]) and told the patient that we will be in contact with him and/or his dentist.

## 2016-09-27 DIAGNOSIS — G4733 Obstructive sleep apnea (adult) (pediatric): Secondary | ICD-10-CM | POA: Diagnosis not present

## 2016-09-29 NOTE — Telephone Encounter (Signed)
Faxed note to [facility] Office with recommendations from Dr. Trula Slade, in a letter dated 07/30/16, to hold Plavix 3-5 days prior to dental procedure, and resume ASAP following the procedure.  Letter faxed to [phone].

## 2016-10-09 ENCOUNTER — Ambulatory Visit: Payer: Medicare HMO | Admitting: Internal Medicine

## 2016-10-09 DIAGNOSIS — I10 Essential (primary) hypertension: Secondary | ICD-10-CM | POA: Diagnosis not present

## 2016-10-09 DIAGNOSIS — F17200 Nicotine dependence, unspecified, uncomplicated: Secondary | ICD-10-CM | POA: Diagnosis not present

## 2016-10-09 DIAGNOSIS — E785 Hyperlipidemia, unspecified: Secondary | ICD-10-CM | POA: Diagnosis not present

## 2016-10-09 DIAGNOSIS — Z0000 Encounter for general adult medical examination without abnormal findings: Secondary | ICD-10-CM | POA: Diagnosis not present

## 2016-10-09 DIAGNOSIS — Z125 Encounter for screening for malignant neoplasm of prostate: Secondary | ICD-10-CM | POA: Diagnosis not present

## 2016-10-09 DIAGNOSIS — I701 Atherosclerosis of renal artery: Secondary | ICD-10-CM | POA: Diagnosis not present

## 2016-10-09 DIAGNOSIS — I714 Abdominal aortic aneurysm, without rupture: Secondary | ICD-10-CM | POA: Diagnosis not present

## 2016-10-09 DIAGNOSIS — F329 Major depressive disorder, single episode, unspecified: Secondary | ICD-10-CM | POA: Diagnosis not present

## 2016-10-09 DIAGNOSIS — G4733 Obstructive sleep apnea (adult) (pediatric): Secondary | ICD-10-CM | POA: Diagnosis not present

## 2016-10-09 DIAGNOSIS — Z23 Encounter for immunization: Secondary | ICD-10-CM | POA: Diagnosis not present

## 2016-10-09 DIAGNOSIS — E119 Type 2 diabetes mellitus without complications: Secondary | ICD-10-CM | POA: Diagnosis not present

## 2016-10-09 DIAGNOSIS — J449 Chronic obstructive pulmonary disease, unspecified: Secondary | ICD-10-CM | POA: Diagnosis not present

## 2016-10-09 LAB — PSA: PSA: 2

## 2016-10-15 DIAGNOSIS — R69 Illness, unspecified: Secondary | ICD-10-CM | POA: Diagnosis not present

## 2016-10-30 ENCOUNTER — Ambulatory Visit (INDEPENDENT_AMBULATORY_CARE_PROVIDER_SITE_OTHER): Payer: Medicare HMO | Admitting: Internal Medicine

## 2016-10-30 ENCOUNTER — Encounter: Payer: Self-pay | Admitting: Internal Medicine

## 2016-10-30 DIAGNOSIS — J449 Chronic obstructive pulmonary disease, unspecified: Secondary | ICD-10-CM | POA: Diagnosis not present

## 2016-10-30 DIAGNOSIS — R911 Solitary pulmonary nodule: Secondary | ICD-10-CM

## 2016-10-30 DIAGNOSIS — F17210 Nicotine dependence, cigarettes, uncomplicated: Secondary | ICD-10-CM | POA: Diagnosis not present

## 2016-10-30 NOTE — Assessment & Plan Note (Signed)
You have moderate copd on testing You have some wheezing today and I am understanding this is baseline  Plan - if wheezing gets worse call us for antibiotic and prednisone  - otherise continue enjoying the farm, daily spiriva and symbicort - use albuterol as needed  Followup - Nov/dec 2018 or sooner if needed

## 2016-10-30 NOTE — Patient Instructions (Signed)
Incidental lung nodule, > 5m and < 86mDo ct chest wo contrast in nov/dec 2018  followup  return to see me in nov/dec 2018 after CT chest  Smoking greater than 40 pack years Understand you love smoking and enjoy it and that is fine  Stage 2 moderate COPD by GOLD classification ([place] moderate copd on testing You have some wheezing today and I am understanding this is baseline  Plan - if wheezing gets worse [place] and prednis[place]  - otherise continue enjoying the farm, daily spiriva and symbicort - use albuterol as needed  Followup - Nov/dec 2018 or sooner if needed

## 2016-10-30 NOTE — Progress Notes (Signed)
Subjective:     Patient ID: Kevin Frank, male   DOB: 1940/08/15, 76 y.o.   MRN: [id number]  PCP Sinda Du, MD   HPI    PCP Alonza Bogus, MD Vascular surgeon Dr. Trula Slade Allergist for headaches Cardiologist Alan Mulder  HPI  IOV 02/13/2016  Chief Complaint  Patient presents with  . Pulmonary Consult    Pt referred by Dr. Ishmael Holter for COPD. Pt c/o prod cough with white to yellow mucus. Pt denies SOB and CP/tightness.     76 year old male who presents with his wife. 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 Then approximately early July 2017 he was spray painting the basement with bleach in a closed door environment without any windows.  Since then he's had an abrupt worsening in cough and shortness of breath.. He underplays his symptoms while his wife narrates the severity of symptoms to be a lot more. He denies any wheezing. He did apparently see Dr. Luan Pulling at [facility] pulmonology. He was given antibiotic and some sort of steroids but this has not helped. Wife is wondering about prednisone. COPD cat score is 20 assuming he has COPD    He was having workup for abdominal aortic aneurysm and needs a stent. He was seen by cardiology. There is CT abdomen which in the lung cut showed a incomplete 5.8 mm right lower lobe nodule which is a new finding. This is a 01/21/2016. He now has a [employer] and a CT chest pending 02/20/2016.  Of note strong smoking history: Refuses to quit   Spirometry 10/02/2015: Personally visualized the trace is normal Blood work 02/08/2016: Normal creatinine 0.98 mg percent. Normal hemoglobin 14.1 g percent. CT abdomen lung cut 01/21/2016: 5.8 mm partially visualized nodule in the right lower lobe  Walking desaturation test 185  feet 3 laps on room air 8/.23/17: Did not desaturate below 93%. Completed all 3 laps but he did started resting pulse ox of 98%.   OV 03/05/2016  Chief Complaint  Patient presents with  . Follow-up    Pt had CT Chest and Stress test 02/20/16. He states that his cough seems to be some better. His breathing is unchanged. No new co's today.    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 He has normal spirometry. He is here after cardiac stress test and CT scan evaluation which was done for lung nodules. CT scan does not show lung cancer. It is documented below. He only is stable 5 mm nodule. He is below 70 years of age. cardiac stress test 02/20/2016: normal per cards report. He was also supposed to have PFT but he forgot to have this. He is stable on triple inhaler therapy. He does not want to pulmonary rehabilitation because he works hard He continues to smoke.    IMPRESSION: 1. Multiple 1 tiny pulmonary nodules in the lungs, largest of which has a mean diameter of only 5 mm, as above. No follow-up needed if patient is low-risk (and has no known or suspected primary neoplasm). Non-contrast chest CT can be considered in 12 months if patient is high-risk. This recommendation follows the consensus statement: Guidelines for Management of Incidental Pulmonary Nodules Detected on CT Images:From the Fleischner Society 2017; published online before print (10.1148/radiol.0762263335). 2. Aortic atherosclerosis, in addition to left main and 3  vessel coronary artery disease. Assessment for potential risk factor modification, dietary therapy or pharmacologic therapy may be warranted, if clinically indicated. 3. There are calcifications of the aortic valve. Echocardiographic correlation for evaluation of potential valvular dysfunction may be warranted if clinically indicated. 4. Large infrarenal abdominal aortic aneurysm measuring at least 5.1 x 4.7 cm incompletely visualized, but appears grossly similar to  the recent prior examination. Recommend followup by abdomen and pelvis CTA in 6 months, and vascular surgery referral/consultation if not already obtained. This recommendation follows ACR consensus guidelines: White Paper of the ACR Incidental Findings Committee II on Vascular Findings. J Am Coll Radiol 2013; 10:789-794. 5. Mild diffuse bronchial thickening with mild centrilobular and paraseptal emphysema; imaging findings suggestive of underlying COPD.   Electronically Signed   By: Vinnie Langton M.D.   On: 02/20/2016 09:43     OV 10/30/2016  Chief Complaint  Patient presents with  . Follow-up    Pt here after PFT. Pt states his SOB is unchanged since last OV. Pt prod cough with clear to white mucus in morning. Pt denies CP/tightness.    Follow-up smoking: He does not want to quit. He says he is old enough that he is enjoying it and does not see the utility in quitting  Follow-up COPD: He is on triple inhaler therapy. We did pulmonary function test in March 2018. I personally visualized the gra[h. He has stage II COPD with bronchodilator response  Lung nodule: He has small lung nodule in November 2017. 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 5 is highest burden 02/13/2016   Never Cough -> Cough all the time 5  No phlegm in chest -> Chest is full of phlegm 3  No chest tightness -> Chest feels very tight 2  No dyspnea for 1 flight stairs/hill -> Very dyspneic for 1 flight of stairs 3  No limitations for ADL at home -> Very limited with ADL at home 4  Confident leaving home -> Not at all confident leaving home 0  Sleep soundly -> Do not sleep soundly because of lung condition 0  Lots of Energy -> No energy at all 3  TOTAL Score (max 40)  20     Results for Kevin Frank, Kevin Frank (MRN [id number]) as  of 10/30/2016 09:25  Ref. Range 08/22/2016 10:16  FEV1-Post Latest Units: L 2.06  FEV1-%Pred-Post Latest Units: % 67  FEV1-%Change-Post Latest Units: % 15  Post FEV1/FVC ratio Latest Units: % 58  Results for Kevin Frank, Kevin Frank (MRN [id number]) as of 10/30/2016 09:25  Ref. Range 08/22/2016 10:16  DLCO cor Latest Units: ml/min/mmHg 14.48  DLCO cor % pred Latest Units: % 44       has a past medical history of AAA (abdominal aortic aneurysm) ([place]) (01/2008); BPH (benign prostatic hyperplasia); COPD (chronic obstructive pulmonary disease) ([place]); Diabetes mellitus; Gout; History of kidney stones; Hypercholesterolemia; Hyperlipidemia; Hypertension; Normal nuclear stress test (2011); Obesity; Obstructive sleep apnea on CPAP; Presence of indwelling urinary catheter; Shortness of breath; and Tobacco abuse.   reports that he has been smoking Cigarettes.  He has a 100.00 pack-year smoking history. He has never used smokeless tobacco.  Past Surgical History:  Procedure Laterality Date  . ABDOMINAL AORTIC ENDOVASCULAR FENESTRATED STENT GRAFT N/A 04/09/2016  Procedure: ABDOMINAL AORTIC ENDOVASCULAR FENESTRATED STENT GRAFT with left brachial artery access using ultrasound guidance;  Surgeon: Serafina Mitchell, MD;  Location: [facility];  Service: Vascular;  Laterality: N/A;  . [place]   [place]  . PERIPHERAL VASCULAR CATHETERIZATION N/A 04/10/2016   Procedure: Renal Angiography;  Surgeon: Angelia Mould, MD;  Location: [place] [facility];  Service: Cardiovascular;  Laterality: N/A;  . TRANSURETHRAL RESECTION OF PROSTATE  01/07/2011   Procedure: TRANSURETHRAL RESECTION OF THE PROSTATE (TURP);  Surgeon: Marissa Nestle;  Location: [facility];  Service: Urology;  Laterality: N/A;    Allergies  Allergen Reactions  . No Known Allergies     Immunization History  Administered Date(s) Administered  . Influenza, High Dose Seasonal PF 03/05/2016  . Pneumococcal Polysaccharide-23 01/08/2011    Family  History  Problem Relation Age of Onset  . Liver disease Father   . Lymphoma Mother   . Diabetes Mother   . Hypertension Mother   . Cancer Mother   . Mental retardation Unknown        sibling -- from birth      Current Outpatient Prescriptions:  .  albuterol (PROVENTIL HFA;VENTOLIN HFA) 108 (90 BASE) MCG/ACT inhaler, Inhale 2 puffs into the lungs every 6 (six) hours as needed (rescue inhaler). , Disp: , Rfl:  .  allopurinol (ZYLOPRIM) 300 MG tablet, Take 300 mg by mouth daily.  , Disp: , Rfl:  .  amLODipine (NORVASC) 10 MG tablet, TAKE 1 TABLET (10 MG TOTAL) BY MOUTH DAILY., Disp: 90 tablet, Rfl: 2 .  atorvastatin (LIPITOR) 20 MG tablet, Take 20 mg by mouth daily. , Disp: , Rfl:  .  azelastine (ASTELIN) 0.1 % nasal spray, Place 2 sprays into both nostrils 2 (two) times daily., Disp: , Rfl:  .  budesonide-formoterol (SYMBICORT) 160-4.5 MCG/ACT inhaler, Inhale 2 puffs into the lungs 2 (two) times daily., Disp: 3 Inhaler, Rfl: 1 .  clopidogrel (PLAVIX) 75 MG tablet, Take 1 tablet (75 mg total) by mouth daily., Disp: 30 tablet, Rfl: 6 .  furosemide (LASIX) 40 MG tablet, Take 40 mg by mouth daily.  , Disp: , Rfl:  .  losartan (COZAAR) 50 MG tablet, Take 50 mg by mouth daily. , Disp: , Rfl:  .  metFORMIN (GLUCOPHAGE) 500 MG tablet, Take 500 mg by mouth 2 (two) times daily with a meal. , Disp: , Rfl:  .  metoprolol succinate (TOPROL-XL) 25 MG 24 hr tablet, TAKE 1 TABLET (25 MG TOTAL) BY MOUTH DAILY., Disp: 90 tablet, Rfl: 2 .  sertraline (ZOLOFT) 50 MG tablet, Take 50 mg by mouth daily.  , Disp: , Rfl:  .  SPIRIVA HANDIHALER 18 MCG inhalation capsule, Place 18 mcg into inhaler and inhale daily. , Disp: , Rfl:   Current Facility-Administered Medications:  .  ipratropium (ATROVENT) nebulizer solution 0.5 mg, 0.5 mg, Nebulization, Once, Ishmael Holter, Roselyn M, MD .  levalbuterol [facility]) nebulizer solution 1.25 mg, 1.25 mg, Nebulization, Once, Gean Quint, MD   Review of Systems      Objective:   Physical Exam  Constitutional: He is oriented to person, place, and time. He appears well-developed and well-nourished. No distress.  HENT:  Head: Normocephalic and atraumatic.  Right Ear: External ear normal.  Left Ear: External ear normal.  Mouth/Throat: Oropharynx is clear and moist. No oropharyngeal exudate.  cig pack in pocket  Eyes: Conjunctivae and EOM are normal. Pupils are equal, round, and reactive to light. Right eye exhibits no discharge.  Left eye exhibits no discharge. No scleral icterus.  Neck: Normal range of motion. Neck supple. No JVD present. No tracheal deviation present. No thyromegaly present.  Cardiovascular: Normal rate, regular rhythm and intact distal pulses.  Exam reveals no gallop and no friction rub.   No murmur heard. Pulmonary/Chest: Effort normal and breath sounds normal. No respiratory distress. He has no wheezes. He has no rales. He exhibits no tenderness.  Some wheezing but he feels baseline and declines pred  Abdominal: Soft. Bowel sounds are normal. He exhibits no distension and no mass. There is no tenderness. There is no rebound and no guarding.  Visceral obesity +  Musculoskeletal: Normal range of motion. He exhibits no edema or tenderness.  Lymphadenopathy:    He has no cervical adenopathy.  Neurological: He is alert and oriented to person, place, and time. He has normal reflexes. No cranial nerve deficit. Coordination normal.  Skin: Skin is warm and dry. No rash noted. He is not diaphoretic. No erythema. No pallor.  Psychiatric: He has a normal mood and affect. His behavior is normal. Judgment and thought content normal.  Nursing note and vitals reviewed.  Vitals:   10/30/16 0915  BP: (!) 142/68  Pulse: 64  SpO2: 94%  Weight: 228 lb 6.4 oz (103.6 kg)  Height: 5' 9.5" (1.765 m)    Estimated body mass index is 33.25 kg/m as calculated from the following:   Height as of this encounter: 5' 9.5" (1.765 m).   Weight as of this  encounter: 228 lb 6.4 oz (103.6 kg).     Assessment:       ICD-9-CM ICD-10-CM   1. Incidental lung nodule, > 223m and < 812m793.11 R91.1   2. Smoking greater than 40 pack years 305.1 F17.210   3. Stage 2 moderate COPD by GOLD classification ([place] J44.9        Plan:     Incidental lung nodule, > 23m72mnd < 8mm26m ct chest wo contrast in nov/dec 2018  followup  return to see me in nov/dec 2018 after CT chest  Smoking greater than 40 pack years Understand you love smoking and enjoy it and that is fine  Stage 2 moderate COPD by GOLD classification (HCC)[place] have moderate copd on testing You have some wheezing today and I am understanding this is baseline  Plan - if wheezing gets worse call us [place] antibiotic and prednisone  - otherise continue enjoying the farm, daily spiriva and symbicort - use albuterol as needed  Followup - Nov/dec 2018 or sooner if needed    Dr. MuraBrand MalesD., F.C.[facility] Pulmonary and Critical Care Medicine Staff Physician [facility] Pager: [phone] no answer or between  15:00h - 7:00h: call [phone]  10/30/2016 9:42 AM

## 2016-10-30 NOTE — Assessment & Plan Note (Signed)
Do ct chest wo contrast in nov/dec 2018  followup  return to see me in nov/dec 2018 after CT chest

## 2016-10-30 NOTE — Assessment & Plan Note (Signed)
Understand you love smoking and enjoy it and that is fine

## 2016-11-11 DIAGNOSIS — H2513 Age-related nuclear cataract, bilateral: Secondary | ICD-10-CM | POA: Diagnosis not present

## 2016-11-11 DIAGNOSIS — E119 Type 2 diabetes mellitus without complications: Secondary | ICD-10-CM | POA: Diagnosis not present

## 2016-11-11 DIAGNOSIS — H524 Presbyopia: Secondary | ICD-10-CM | POA: Diagnosis not present

## 2016-11-12 ENCOUNTER — Other Ambulatory Visit: Payer: Self-pay | Admitting: *Deleted

## 2016-11-12 ENCOUNTER — Other Ambulatory Visit: Payer: Self-pay | Admitting: Cardiovascular Disease

## 2016-11-12 NOTE — Telephone Encounter (Signed)
Received refill request for Symbicort 160 denied refill sent to [place] [phone] needs office visit last visit 10/02/15

## 2016-11-18 ENCOUNTER — Other Ambulatory Visit: Payer: Self-pay | Admitting: Physician Assistant

## 2016-11-26 ENCOUNTER — Encounter: Payer: Self-pay | Admitting: Surgery

## 2016-12-01 ENCOUNTER — Encounter: Payer: Self-pay | Admitting: Surgery

## 2016-12-01 ENCOUNTER — Ambulatory Visit (HOSPITAL_COMMUNITY)
Admission: RE | Admit: 2016-12-01 | Discharge: 2016-12-01 | Disposition: A | Discharge status: Home / Self Care | Payer: Medicare HMO | Source: Ambulatory Visit | Attending: Surgery | Admitting: Surgery

## 2016-12-01 ENCOUNTER — Ambulatory Visit (INDEPENDENT_AMBULATORY_CARE_PROVIDER_SITE_OTHER)
Admission: RE | Admit: 2016-12-01 | Discharge: 2016-12-01 | Disposition: A | Discharge status: Home / Self Care | Payer: Medicare HMO | Source: Ambulatory Visit | Attending: Surgery | Admitting: Surgery

## 2016-12-01 ENCOUNTER — Ambulatory Visit (INDEPENDENT_AMBULATORY_CARE_PROVIDER_SITE_OTHER): Payer: Medicare HMO | Admitting: Surgery

## 2016-12-01 VITALS — BP 139/73 | HR 53 | Temp 97.0°F | Resp 20 | Ht 69.5 in | Wt 221.0 lb

## 2016-12-01 DIAGNOSIS — I701 Atherosclerosis of renal artery: Secondary | ICD-10-CM | POA: Insufficient documentation

## 2016-12-01 DIAGNOSIS — Z9862 Peripheral vascular angioplasty status: Secondary | ICD-10-CM

## 2016-12-01 DIAGNOSIS — R140 Abdominal distension (gaseous): Secondary | ICD-10-CM | POA: Insufficient documentation

## 2016-12-01 DIAGNOSIS — I714 Abdominal aortic aneurysm, without rupture, unspecified: Secondary | ICD-10-CM

## 2016-12-01 DIAGNOSIS — Z9889 Other specified postprocedural states: Secondary | ICD-10-CM

## 2016-12-01 NOTE — Progress Notes (Signed)
Vascular and Vein Specialist of [place]  Patient name: Kevin Frank MRN: [id number] DOB: 14-Nov-1940 Sex: male   REASON FOR VISIT:    F/u FEVAR  [name]:   HY SWIATEK is a 76 y.o. male returns today for his first postoperative follow-up.  On 04/09/2016 he underwent FEVAR with a single right renal fenestration.  I also performed left renal angioplasty at the time of the main body deployment service to ensure that the graft not cover the renal artery because of angulation.  The day following his procedure, his creatinine increased and ultrasound did not show good perfusion to the left kidney and therefore he went to the Cath Lab and had stenting of his left renal artery.  His creatinine trended down and his urine output improved.  The patient also had issues with left leg numbness in his legs after the procedure.  Neurology was consulted.  It was felt this was most likely secondary to lower back disease that was aggravated during surgery.  He is back to normal activity without any restrictions.  He has no complaints today   PAST MEDICAL HISTORY:   Past Medical History:  Diagnosis Date  . AAA (abdominal aortic aneurysm) ([place]) 01/2008   3.2cm  . BPH (benign prostatic hyperplasia)   . COPD (chronic obstructive pulmonary disease) ([place])   . Diabetes mellitus    type II  . Gout   . History of kidney stones   . Hypercholesterolemia   . Hyperlipidemia   . Hypertension   . Normal nuclear stress test 2011  . Obesity   . Obstructive sleep apnea on CPAP   . Presence of indwelling urinary catheter   . Shortness of breath    due to cigarette abuse  . Tobacco abuse      FAMILY HISTORY:   Family History  Problem Relation Age of Onset  . Liver disease Father   . Lymphoma Mother   . Diabetes Mother   . Hypertension Mother   . Cancer Mother   . Mental retardation Unknown        sibling -- from birth     SOCIAL  HISTORY:   Social History  Substance Use Topics  . Smoking status: Current Every Day Smoker    Packs/day: 2.00    Years: 50.00    Types: Cigarettes  . Smokeless tobacco: Never Used     Comment: 2ppd 10/30/2016 ee  . Alcohol use 0.0 oz/week     Comment: occasional     ALLERGIES:   Allergies  Allergen Reactions  . No Known Allergies      CURRENT MEDICATIONS:   Current Outpatient Prescriptions  Medication Sig Dispense Refill  . albuterol (PROVENTIL HFA;VENTOLIN HFA) 108 (90 BASE) MCG/ACT inhaler Inhale 2 puffs into the lungs every 6 (six) hours as needed (rescue inhaler).     [name] allopurinol (ZYLOPRIM) 300 MG tablet Take 300 mg by mouth daily.      [name] amLODipine (NORVASC) 10 MG tablet TAKE 1 TABLET (10 MG TOTAL) BY MOUTH DAILY. 90 tablet 2  . atorvastatin (LIPITOR) 20 MG tablet Take 20 mg by mouth daily.     [name] azelastine (ASTELIN) 0.1 % nasal spray Place 2 sprays into both nostrils 2 (two) times daily.    . budesonide-formoterol (SYMBICORT) 160-4.5 MCG/ACT inhaler Inhale 2 puffs into the lungs 2 (two) times daily. 3 Inhaler 1  . clopidogrel (PLAVIX) 75 MG tablet Take 1 tablet (75 mg total) by mouth daily. [place]  tablet 6  . furosemide (LASIX) 40 MG tablet Take 40 mg by mouth daily.      [name] losartan (COZAAR) 50 MG tablet TAKE 1 TABLET (50 MG TOTAL) BY MOUTH DAILY. 90 tablet 2  . metFORMIN (GLUCOPHAGE) 500 MG tablet Take 500 mg by mouth 2 (two) times daily with a meal.     . metoprolol succinate (TOPROL-XL) 25 MG 24 hr tablet TAKE 1 TABLET (25 MG TOTAL) BY MOUTH DAILY. 90 tablet 2  . sertraline (ZOLOFT) 50 MG tablet Take 50 mg by mouth daily.      [name] SPIRIVA HANDIHALER 18 MCG inhalation capsule Place 18 mcg into inhaler and inhale daily.      Current Facility-Administered Medications  Medication Dose Route Frequency Provider Last Rate Last Dose  . ipratropium (ATROVENT) nebulizer solution 0.5 mg  0.5 mg Nebulization Once Gean Quint, MD      . levalbuterol Penne Lash) nebulizer  solution 1.25 mg  1.25 mg Nebulization Once Gean Quint, MD        REVIEW OF SYSTEMS:   [X]  denotes positive finding, [ ]  denotes negative finding Cardiac  Comments:  Chest pain or chest pressure:    Shortness of breath upon exertion:    Short of breath when lying flat:    Irregular heart rhythm:        Vascular    Pain in calf, thigh, or hip brought on by ambulation:    Pain in feet at night that wakes you up from your sleep:     Blood clot in your veins:    Leg swelling:         Pulmonary    Oxygen at home:    Productive cough:     Wheezing:         Neurologic    Sudden weakness in arms or legs:     Sudden numbness in arms or legs:     Sudden onset of difficulty speaking or slurred speech:    Temporary loss of vision in one eye:     Problems with dizziness:         Gastrointestinal    Blood in stool:     Vomited blood:         Genitourinary    Burning when urinating:     Blood in urine:        Psychiatric    Major depression:         Hematologic    Bleeding problems:    Problems with blood clotting too easily:        Skin    Rashes or ulcers:        Constitutional    Fever or chills:      PHYSICAL EXAM:   Vitals:   12/01/16 1030  BP: 139/73  Pulse: (!) 53  Resp: 20  Temp: 97 F (36.1 C)  TempSrc: Oral  SpO2: 93%  Weight: 221 lb (100.2 kg)  Height: 5' 9.5" (1.765 m)    GENERAL: The patient is a well-nourished male, in no acute distress. The vital signs are documented above. CARDIAC: There is a regular rate and rhythm.  PULMONARY: Non-labored respirations ABDOMEN: Soft and non-tender  MUSCULOSKELETAL: There are no major deformities or cyanosis. NEUROLOGIC: No focal weakness or paresthesias are detected. SKIN: There are no ulcers or rashes noted. PSYCHIATRIC: The patient has a normal affect.  STUDIES:   I have reviewed the ultrasound studies from today.  Body habitus and bowel gas limited the  complete evaluation.  The maximum aortic  diameter now measures 4.5.  Previously it was 5 cm.  Suboptimal was evaluated patient of the renal arteries.  Less than 60% stenosis was visualized and the segments that were able to be visualized.  MEDICAL ISSUES:   Status post fenestrated endovascular aneurysm repair.  The patient is doing very well at this time.  The aneurysm sac is decreasing in size.  No obvious complicating factors or associated with this.  He'll follow up in 6 months with repeat ultrasound studies of his abdominal aorta as well as bilateral renal stents.    Annamarie Major, MD Vascular and Vein Specialists of [facility] [phone] Pager [phone]

## 2016-12-02 DIAGNOSIS — L570 Actinic keratosis: Secondary | ICD-10-CM | POA: Diagnosis not present

## 2016-12-02 DIAGNOSIS — D485 Neoplasm of uncertain behavior of skin: Secondary | ICD-10-CM | POA: Diagnosis not present

## 2016-12-02 DIAGNOSIS — Z85828 Personal history of other malignant neoplasm of skin: Secondary | ICD-10-CM | POA: Diagnosis not present

## 2016-12-02 DIAGNOSIS — C4441 Basal cell carcinoma of skin of scalp and neck: Secondary | ICD-10-CM | POA: Diagnosis not present

## 2016-12-12 NOTE — Addendum Note (Signed)
Addended by: Lianne Cure A on: 12/12/2016 11:02 AM   Modules accepted: Orders

## 2016-12-18 DIAGNOSIS — C44212 Basal cell carcinoma of skin of right ear and external auricular canal: Secondary | ICD-10-CM | POA: Diagnosis not present

## 2016-12-18 DIAGNOSIS — C4441 Basal cell carcinoma of skin of scalp and neck: Secondary | ICD-10-CM | POA: Diagnosis not present

## 2017-01-06 ENCOUNTER — Encounter: Payer: Self-pay | Admitting: *Deleted

## 2017-01-28 DIAGNOSIS — R69 Illness, unspecified: Secondary | ICD-10-CM | POA: Diagnosis not present

## 2017-02-09 ENCOUNTER — Encounter (INDEPENDENT_AMBULATORY_CARE_PROVIDER_SITE_OTHER): Payer: Self-pay

## 2017-02-09 ENCOUNTER — Ambulatory Visit (INDEPENDENT_AMBULATORY_CARE_PROVIDER_SITE_OTHER): Payer: Medicare HMO | Admitting: Nurse Practitioner

## 2017-02-09 ENCOUNTER — Encounter: Payer: Self-pay | Admitting: Nurse Practitioner

## 2017-02-09 ENCOUNTER — Telehealth: Payer: Self-pay | Admitting: *Deleted

## 2017-02-09 VITALS — BP 140/100 | HR 65 | Ht 70.0 in | Wt 226.1 lb

## 2017-02-09 DIAGNOSIS — I10 Essential (primary) hypertension: Secondary | ICD-10-CM

## 2017-02-09 DIAGNOSIS — I714 Abdominal aortic aneurysm, without rupture, unspecified: Secondary | ICD-10-CM

## 2017-02-09 DIAGNOSIS — F17200 Nicotine dependence, unspecified, uncomplicated: Secondary | ICD-10-CM

## 2017-02-09 MED ORDER — LOSARTAN POTASSIUM 100 MG PO TABS: 100.0000 mg | ORAL_TABLET | Freq: Every day | ORAL | 3 refills | Status: DC

## 2017-02-09 NOTE — Patient Instructions (Addendum)
We will be checking the following labs today - NONE  We will call Dr. Luan Pulling to get your labs   Medication Instructions:    Continue with your current medicines. BUT I am increasing the Losartan to 100 mg a day - you can take 2 of your 50 mg tablets and use up - I will send in the RX for the 100 mg to your mail order   Testing/Procedures To Be Arranged:  N/A  Follow-Up:   See me in about 4 to 6 weeks with lab - you do need to fast. No breakfast!    Other Special Instructions:   N/A    If you need a refill on your cardiac medications before your next appointment, please call your pharmacy.   Call the [place] office at [phone] if you have any questions, problems or concerns.

## 2017-02-09 NOTE — Progress Notes (Signed)
CARDIOLOGY OFFICE NOTE  Date:  02/09/2017    Kevin Frank Date of Birth: July 17, 1940 Medical Record #[id number]  PCP:  Sinda Du, MD  Cardiologist:  Jerel Shepherd  Chief Complaint  Patient presents with  . Coronary Artery Disease  . Hypertension  . Hyperlipidemia    Follow up visit - seen for Dr. Burt Knack    History of Present Illness: Kevin Frank is a 76 y.o. male who presents today for a follow up visit. This is a 6 month check. Seen for Dr. Burt Knack. He is a former patient of Dr. Susa Simmonds.   He has a history of hypertension, hyperlipidemia, DM, tobacco abuse, COPD, and abdominal aortic aneurysm. The patient's aneurysm surveillance is performed by [facility]. At his recent assessment his AAA measured 4.9cm - this was mildly increased from prior study. He is scheduled for another study in 6 months. He continues to smoke cigarettes. He has not had any known CAD.   Last seen back in September of 2016 by Dr. Burt Knack. Cardiac status was stable. Does have shortness of breath - continues to smoke - not interested in stopping.   I last saw him back in August of 2017 - he was doing ok - needing to get his AAA repaired - still smoking. Pretty active despite his COPD. Recent CT with a lung nodule noted - this turned out ok.   On 04/09/2016 he underwent FEVARwith a single rightrenal fenestration by Dr. Trula Slade. He also had left renal angioplasty at the time of the main body deployment service to ensure that the graft not cover the renal artery because of angulation. The day following his procedure, his creatinine increased and ultrasound did not show good perfusion to the left kidney and therefore he went to the Cath Lab and had stenting of his left renal artery. His creatinine trended down and his urine output improved. The patient also had issues with left leg numbness in his legs after the procedure. Neurology was consulted. It was felt this was most likely secondary to  lower back disease that was aggravated during surgery. I last saw him back in February and he was doing well.  Still smoking - no intent to stop.   Comes in today. Here alone. He is doing well. He is more upset today with "[place]" - says they don't want him having people staying on his property. Lots of legal issues with the zoning and permits. He is quite upset. No chest pain. Breathing ok. BP is up - but he was served with papers regarding this issue just this morning.   Past Medical History:  Diagnosis Date  . AAA (abdominal aortic aneurysm) ([place]) 01/2008   3.2cm  . BPH (benign prostatic hyperplasia)   . COPD (chronic obstructive pulmonary disease) ([place])   . Diabetes mellitus    type II  . Gout   . History of kidney stones   . Hypercholesterolemia   . Hyperlipidemia   . Hypertension   . Normal nuclear stress test 2011  . Obesity   . Obstructive sleep apnea on CPAP   . Presence of indwelling urinary catheter   . Shortness of breath    due to cigarette abuse  . Tobacco abuse     Past Surgical History:  Procedure Laterality Date  . ABDOMINAL AORTIC ENDOVASCULAR FENESTRATED STENT GRAFT N/A 04/09/2016   Procedure: ABDOMINAL AORTIC ENDOVASCULAR FENESTRATED STENT GRAFT with left brachial artery access using ultrasound guidance;  Surgeon: Serafina Mitchell,  MD;  Location: [facility];  Service: Vascular;  Laterality: N/A;  . [place]   [place]  . PERIPHERAL VASCULAR CATHETERIZATION N/A 04/10/2016   Procedure: Renal Angiography;  Surgeon: Angelia Mould, MD;  Location: [facility];  Service: Cardiovascular;  Laterality: N/A;  . TRANSURETHRAL RESECTION OF PROSTATE  01/07/2011   Procedure: TRANSURETHRAL RESECTION OF THE PROSTATE (TURP);  Surgeon: Marissa Nestle;  Location: [facility];  Service: Urology;  Laterality: N/A;     Medications: Current Meds  Medication Sig  . albuterol (PROVENTIL HFA;VENTOLIN HFA) 108 (90 BASE) MCG/ACT inhaler Inhale 2 puffs into the  lungs every 6 (six) hours as needed (rescue inhaler).   [name] allopurinol (ZYLOPRIM) 300 MG tablet Take 300 mg by mouth daily.    [name] amLODipine (NORVASC) 10 MG tablet TAKE 1 TABLET (10 MG TOTAL) BY MOUTH DAILY.  [name] atorvastatin (LIPITOR) 20 MG tablet Take 20 mg by mouth daily.   [name] azelastine (ASTELIN) 0.1 % nasal spray Place 2 sprays into both nostrils 2 (two) times daily.  . budesonide-formoterol (SYMBICORT) 160-4.5 MCG/ACT inhaler Inhale 2 puffs into the lungs 2 (two) times daily.  . clopidogrel (PLAVIX) 75 MG tablet Take 1 tablet (75 mg total) by mouth daily.  . furosemide (LASIX) 40 MG tablet Take 40 mg by mouth daily.    . metFORMIN (GLUCOPHAGE) 500 MG tablet Take 500 mg by mouth 2 (two) times daily with a meal.   . metoprolol succinate (TOPROL-XL) 25 MG 24 hr tablet TAKE 1 TABLET (25 MG TOTAL) BY MOUTH DAILY.  [name] sertraline (ZOLOFT) 50 MG tablet Take 50 mg by mouth daily.    [name] SPIRIVA HANDIHALER 18 MCG inhalation capsule Place 18 mcg into inhaler and inhale daily.   . [DISCONTINUED] losartan (COZAAR) 50 MG tablet TAKE 1 TABLET (50 MG TOTAL) BY MOUTH DAILY.   Current Facility-Administered Medications for the 02/09/17 encounter (Office Visit) with Burtis Junes, NP  Medication  . ipratropium (ATROVENT) nebulizer solution 0.5 mg  . levalbuterol (XOPENEX) nebulizer solution 1.25 mg     Allergies: Allergies  Allergen Reactions  . No Known Allergies     Social History: The patient  reports that he has been smoking Cigarettes.  He has a 100.00 pack-year smoking history. He has never used smokeless tobacco. He reports that he drinks alcohol. He reports that he does not use drugs.   Family History: The patient's family history includes Cancer in his mother; Diabetes in his mother; Hypertension in his mother; Liver disease in his father; Lymphoma in his mother; Mental retardation in his unknown relative.   Review of Systems: Please see the history of present illness.   Otherwise, the review  of systems is positive for none.   All other systems are reviewed and negative.   Physical Exam: VS:  BP (!) 140/100 (BP Location: Left Arm, Patient Position: Sitting, Cuff Size: Normal)   Pulse 65   Ht 5\' 10"  (1.778 m)   Wt 226 lb 1.9 oz (102.6 kg)   BMI 32.44 kg/m  .  BMI Body mass index is 32.44 kg/m.  Wt Readings from Last 3 Encounters:  02/09/17 226 lb 1.9 oz (102.6 kg)  12/01/16 221 lb (100.2 kg)  10/30/16 228 lb 6.4 oz (103.6 kg)    BP is 170/80 by me.   General: Pleasant. Well developed, well nourished and in no acute distress.  Face ruddy. Smells of cigarettes.  HEENT: Normal.  Neck: Supple, no JVD, carotid bruits, or masses  noted.  Cardiac: Regular rate and rhythm. No murmurs, rubs, or gallops. No edema.  Respiratory:  Lungs are coarse but with normal work of breathing.  GI: Soft and nontender.  MS: No deformity or atrophy. Gait and ROM intact.  Skin: Warm and dry. Color is normal.  Neuro:  Strength and sensation are intact and no gross focal deficits noted.  Psych: Alert, appropriate and with normal affect.   LABORATORY DATA:  EKG:  EKG is ordered today. This demonstrates NSR with RSR'.  Lab Results  Component Value Date   WBC 9.3 04/14/2016   HGB 12.7 (L) 04/14/2016   HCT 36.5 (L) 04/14/2016   PLT 127 (L) 04/14/2016   GLUCOSE 120 (H) 04/14/2016   CHOL 138 12/16/2010   TRIG 53.0 12/16/2010   HDL 46.10 12/16/2010   LDLCALC 81 12/16/2010   ALT 23 04/02/2016   AST 18 04/02/2016   NA 135 04/14/2016   K 3.2 (L) 04/14/2016   CL 104 04/14/2016   CREATININE 1.43 (H) 04/14/2016   BUN 24 (H) 04/14/2016   CO2 22 04/14/2016   INR 1.08 04/09/2016   HGBA1C 6.0 (H) 04/02/2016     BNP (last 3 results) No results for input(s): BNP in the last 8760 hours.  ProBNP (last 3 results) No results for input(s): PROBNP in the last 8760 hours.   Other Studies Reviewed Today:  Myoview Study Highlights from 02/2016    Nuclear stress EF: 58%.  There was no ST  segment deviation noted during stress.  The study is normal.  This is a low risk study.    Echo Study Conclusions from 02/2016  - Left ventricle: The cavity size was normal. Wall thickness was increased in a pattern of mild LVH. Systolic function was normal. The estimated ejection fraction was in the range of 60% to 65%. Wall motion was normal; there were no regional wall motion abnormalities. Features are consistent with a pseudonormal left ventricular filling pattern, with concomitant abnormal relaxation and increased filling pressure (grade 2 diastolic dysfunction). Doppler parameters are consistent with high ventricular filling pressure. - Mitral valve: There was mild regurgitation. - Left atrium: The atrium was moderately dilated. - Pulmonary arteries: Systolic pressure was mildly increased. PA peak pressure: 37 mm Hg (S).  Impressions:  - Normal LV systolic function; grade 2 diastolic dysfunction with elevated LV filling pressure; mild MR; moderate LAE; trace TR with mildly elevated pulmonary pressure.  CT ANGIO CHEST/ABD/PELVIS IMPRESSION: 1. Patent suprarenal bifurcated aortic stent graft without endoleak, native sac diameter 5.3 cm. 2. Coronary and Aortic Atherosclerosis (ICD10-170.0) without dissection. 3. Descending and sigmoid diverticulosis.   Electronically Signed By: Lucrezia Europe M.D. On: 05/16/2016 11:02    Assessment/Plan:  1.HTN - BP is up - higher by my recheck. I suspect this is due to his legal issues that he is going thru. ARB is increased today. See back in 4 to 6 weeks with labs.   2. HLD - on statin - labs checked by PCP - according to the [facility] - no lipids since 2012 - tried to call PCP's office to verify - only got voice mail - have left message. May need to do on return visit with me.   3. Long standing tobacco - not interested in stopping.   4. Prior AAA/FEVAR repair with stenting of the left renal  artery - followed by [facility].   Current medicines are reviewed with the patient today.  The patient does not have concerns regarding medicines other than what has  been noted above.  The following changes have been made:  See above.  Labs/ tests ordered today include:    Orders Placed This Encounter  Procedures  . EKG 12-Lead     Disposition:   FU with me in 4 to 6 weeks. May need to do labs on return - at least BMET.   Patient is agreeable to this plan and will call if any problems develop in the interim.   SignedTruitt Merle, NP  02/09/2017 11:11 AM  [facility] [address] [place], [place]  [address] Phone: [phone] Fax: [phone]

## 2017-02-09 NOTE — Telephone Encounter (Signed)
LVM for Dr. Luan Pulling office  @ [phone] to see if the POC paperwork received about lipids from 2012 is correct.

## 2017-02-19 DIAGNOSIS — G4733 Obstructive sleep apnea (adult) (pediatric): Secondary | ICD-10-CM | POA: Diagnosis not present

## 2017-02-19 DIAGNOSIS — R269 Unspecified abnormalities of gait and mobility: Secondary | ICD-10-CM | POA: Diagnosis not present

## 2017-02-19 DIAGNOSIS — I714 Abdominal aortic aneurysm, without rupture: Secondary | ICD-10-CM | POA: Diagnosis not present

## 2017-03-03 ENCOUNTER — Other Ambulatory Visit: Payer: Self-pay | Admitting: *Deleted

## 2017-03-03 MED ORDER — CLOPIDOGREL BISULFATE 75 MG PO TABS: 75.0000 mg | ORAL_TABLET | Freq: Every day | ORAL | 6 refills | Status: DC

## 2017-03-04 ENCOUNTER — Other Ambulatory Visit: Payer: Self-pay | Admitting: Vascular Surgery

## 2017-03-05 ENCOUNTER — Other Ambulatory Visit: Payer: Commercial Managed Care - HMO

## 2017-03-11 ENCOUNTER — Other Ambulatory Visit: Payer: Self-pay | Admitting: Cardiovascular Disease

## 2017-03-11 MED ORDER — METOPROLOL SUCCINATE ER 25 MG PO TB24: 25.0000 mg | ORAL_TABLET | Freq: Every day | ORAL | 3 refills | Status: DC

## 2017-03-11 MED ORDER — AMLODIPINE BESYLATE 10 MG PO TABS: 10.0000 mg | ORAL_TABLET | Freq: Every day | ORAL | 3 refills | Status: DC

## 2017-03-18 ENCOUNTER — Encounter: Payer: Self-pay | Admitting: Nurse Practitioner

## 2017-03-18 ENCOUNTER — Encounter (INDEPENDENT_AMBULATORY_CARE_PROVIDER_SITE_OTHER): Payer: Self-pay

## 2017-03-18 ENCOUNTER — Ambulatory Visit (INDEPENDENT_AMBULATORY_CARE_PROVIDER_SITE_OTHER): Payer: Medicare HMO | Admitting: Nurse Practitioner

## 2017-03-18 VITALS — BP 170/80 | HR 57 | Ht 70.0 in | Wt 230.0 lb

## 2017-03-18 DIAGNOSIS — I10 Essential (primary) hypertension: Secondary | ICD-10-CM | POA: Diagnosis not present

## 2017-03-18 DIAGNOSIS — F17200 Nicotine dependence, unspecified, uncomplicated: Secondary | ICD-10-CM | POA: Diagnosis not present

## 2017-03-18 DIAGNOSIS — E7800 Pure hypercholesterolemia, unspecified: Secondary | ICD-10-CM | POA: Diagnosis not present

## 2017-03-18 LAB — BASIC METABOLIC PANEL
BUN/Creatinine Ratio: 14 (ref 10–24)
BUN: 15 mg/dL (ref 8–27)
CO2: 22 mmol/L (ref 20–29)
Calcium: 9.8 mg/dL (ref 8.6–10.2)
Chloride: 100 mmol/L (ref 96–106)
Creatinine, Ser: 1.05 mg/dL (ref 0.76–1.27)
GFR calc Af Amer: 79 mL/min/{1.73_m2} (ref >59)
GFR calc non Af Amer: 69 mL/min/{1.73_m2} (ref >59)
Glucose: 102 mg/dL — ABNORMAL HIGH (ref 65–99)
Potassium: 4.4 mmol/L (ref 3.5–5.2)
Sodium: 141 mmol/L (ref 134–144)

## 2017-03-18 MED ORDER — HYDRALAZINE HCL 25 MG PO TABS: 25.0000 mg | ORAL_TABLET | Freq: Two times a day (BID) | ORAL | 6 refills | Status: DC

## 2017-03-18 NOTE — Patient Instructions (Addendum)
We will be checking the following labs today - BMET   Medication Instructions:    Continue with your current medicines.     Testing/Procedures To Be Arranged:  N/A  Follow-Up:   See me in about 2 to 3 months    Other Special Instructions:   Try to check some blood pressures for me - call us in about 2 weeks with an update - I would like to get your BP below 654 systolic    If you need a refill on your cardiac medications before your next appointment, please call your pharmacy.   Call the [place] office at [phone] if you have any questions, problems or concerns.

## 2017-03-18 NOTE — Progress Notes (Signed)
CARDIOLOGY OFFICE NOTE  Date:  03/18/2017    [name] Date of Birth: [birth year] Medical Record #[id number]  PCP:  Sinda Du, MD  Cardiologist:  Jerel Shepherd    Chief Complaint  Patient presents with  . Hypertension  . Hyperlipidemia  . Shortness of Breath    Follow up visit after med change - seen for Dr. Burt Knack    History of Present Illness: Kevin Frank is a 76 y.o. male who presents today for a follow up visit. Seen for Dr. Burt Knack. He is a former patient of Dr. Susa Simmonds.   He has a history of hypertension, hyperlipidemia, DM, tobacco abuse, COPD, and abdominal aortic aneurysm. The patient's aneurysm surveillance is performed by [facility]. At his recent assessment his AAA measured 4.9cm - this was mildly increased from prior study. He is scheduled for another study in 6 months. He continues to smoke cigarettes. He has not had any known CAD.   Last seen back in September of 2016 by Dr. Burt Knack. Cardiac status was stable. Does have shortness of breath - continues to smoke - not interested in stopping.   I last saw him back in August of 2017 - he was doing ok - needing to get his AAA repaired - still smoking. Pretty active despite his COPD. Recent CT with a lung nodule noted - this turned out ok.   On 04/09/2016 he underwent FEVARwith a single rightrenal fenestration by Dr. Trula Slade. He also had left renal angioplasty at the time of the main body deployment service to ensure that the graft not cover the renal artery because of angulation. The day following his procedure, his creatinine increased and ultrasound did not show good perfusion to the left kidney and therefore he went to the Cath Lab and had stenting of his left renal artery. His creatinine trended down and his urine output improved. The patient also had issues with left leg numbness in his legs after the procedure. Neurology was consulted. It was felt this was most likely secondary to lower  back disease that was aggravated during surgery. I last saw him back in February and he was doing well.  Still smoking - no intent to stop.   I last saw him back in August - having lots of legal issues - BP was up - ARB was increased.   Comes in today. Here with his wife today Kevin Frank. BP remains high. He is coughing. Limited by back pain. Not sleeping well. Seeing a lawyer later today about his land/zoning issues. He is not checking his BP at home - remains high here today. He says overall he feels the same.   Past Medical History:  Diagnosis Date  . AAA (abdominal aortic aneurysm) ([place]) 01/2008   3.2cm  . BPH (benign prostatic hyperplasia)   . COPD (chronic obstructive pulmonary disease) ([place])   . Diabetes mellitus    type II  . Gout   . History of kidney stones   . Hypercholesterolemia   . Hyperlipidemia   . Hypertension   . Normal nuclear stress test 2011  . Obesity   . Obstructive sleep apnea on CPAP   . Presence of indwelling urinary catheter   . Shortness of breath    due to cigarette abuse  . Tobacco abuse     Past Surgical History:  Procedure Laterality Date  . ABDOMINAL AORTIC ENDOVASCULAR FENESTRATED STENT GRAFT N/A 04/09/2016   Procedure: ABDOMINAL AORTIC ENDOVASCULAR FENESTRATED STENT GRAFT with left  brachial artery access using ultrasound guidance;  Surgeon: Serafina Mitchell, MD;  Location: [facility];  Service: Vascular;  Laterality: N/A;  . [place]   [place]  . PERIPHERAL VASCULAR CATHETERIZATION N/A 04/10/2016   Procedure: Renal Angiography;  Surgeon: Angelia Mould, MD;  Location: [facility];  Service: Cardiovascular;  Laterality: N/A;  . TRANSURETHRAL RESECTION OF PROSTATE  01/07/2011   Procedure: TRANSURETHRAL RESECTION OF THE PROSTATE (TURP);  Surgeon: Marissa Nestle;  Location: [facility];  Service: Urology;  Laterality: N/A;     Medications: Current Meds  Medication Sig  . albuterol (PROVENTIL HFA;VENTOLIN HFA) 108 (90 BASE) MCG/ACT  inhaler Inhale 2 puffs into the lungs every 6 (six) hours as needed (rescue inhaler).   [name] allopurinol (ZYLOPRIM) 300 MG tablet Take 300 mg by mouth daily.    [name] amLODipine (NORVASC) 10 MG tablet Take 1 tablet (10 mg total) by mouth daily.  [name] atorvastatin (LIPITOR) 20 MG tablet Take 20 mg by mouth daily.   [name] azelastine (ASTELIN) 0.1 % nasal spray Place 2 sprays into both nostrils 2 (two) times daily.  . budesonide-formoterol (SYMBICORT) 160-4.5 MCG/ACT inhaler Inhale 2 puffs into the lungs 2 (two) times daily.  . clopidogrel (PLAVIX) 75 MG tablet TAKE (1) TABLET BY MOUTH ONCE DAILY.  . furosemide (LASIX) 40 MG tablet Take 40 mg by mouth daily.    [name] losartan (COZAAR) 100 MG tablet Take 1 tablet (100 mg total) by mouth daily.  . metFORMIN (GLUCOPHAGE) 500 MG tablet Take 500 mg by mouth 2 (two) times daily with a meal.   . metoprolol succinate (TOPROL-XL) 25 MG 24 hr tablet Take 1 tablet (25 mg total) by mouth daily.  . sertraline (ZOLOFT) 50 MG tablet Take 50 mg by mouth daily.    [name] SPIRIVA HANDIHALER 18 MCG inhalation capsule Place 18 mcg into inhaler and inhale daily.    Current Facility-Administered Medications for the 03/18/17 encounter (Office Visit) with Burtis Junes, NP  Medication  . ipratropium (ATROVENT) nebulizer solution 0.5 mg  . levalbuterol (XOPENEX) nebulizer solution 1.25 mg     Allergies: Allergies  Allergen Reactions  . No Known Allergies     Social History: The patient  reports that he has been smoking Cigarettes.  He has a 100.00 pack-year smoking history. He has never used smokeless tobacco. He reports that he drinks alcohol. He reports that he does not use drugs.   Family History: The patient's family history includes Cancer in his mother; Diabetes in his mother; Hypertension in his mother; Liver disease in his father; Lymphoma in his mother; Mental retardation in his unknown relative.   Review of Systems: Please see the history of present illness.   Otherwise,  the review of systems is positive for none.   All other systems are reviewed and negative.   Physical Exam: VS:  BP (!) 170/80 (BP Location: Left Arm, Patient Position: Sitting, Cuff Size: Normal)   Pulse (!) 57   Ht 5\' 10"  (1.778 m)   Wt 230 lb (104.3 kg)   SpO2 93% Comment: at rest  BMI 33.00 kg/m  .  BMI Body mass index is 33 kg/m.  Wt Readings from Last 3 Encounters:  03/18/17 230 lb (104.3 kg)  02/09/17 226 lb 1.9 oz (102.6 kg)  12/01/16 221 lb (100.2 kg)    General: Pleasant. Elderly male. Smells of tobacco. Alert and in no acute distress.   HEENT: Normal.  Neck: Supple, no JVD, carotid bruits, or  masses noted.  Cardiac: Regular rate and rhythm. No murmurs, rubs, or gallops. No edema.  Respiratory:  Lungs are clear to auscultation bilaterally with normal work of breathing.  GI: Soft and nontender.  MS: No deformity or atrophy. Gait and ROM intact.  Skin: Warm and dry. Color is normal.  Neuro:  Strength and sensation are intact and no gross focal deficits noted.  Psych: Alert, appropriate and with normal affect.   LABORATORY DATA:  EKG:  EKG is not ordered today.  Lab Results  Component Value Date   WBC 9.3 04/14/2016   HGB 12.7 (L) 04/14/2016   HCT 36.5 (L) 04/14/2016   PLT 127 (L) 04/14/2016   GLUCOSE 120 (H) 04/14/2016   CHOL 138 12/16/2010   TRIG 53.0 12/16/2010   HDL 46.10 12/16/2010   LDLCALC 81 12/16/2010   ALT 23 04/02/2016   AST 18 04/02/2016   NA 135 04/14/2016   K 3.2 (L) 04/14/2016   CL 104 04/14/2016   CREATININE 1.43 (H) 04/14/2016   BUN 24 (H) 04/14/2016   CO2 22 04/14/2016   INR 1.08 04/09/2016   HGBA1C 6.0 (H) 04/02/2016     BNP (last 3 results) No results for input(s): BNP in the last 8760 hours.  ProBNP (last 3 results) No results for input(s): PROBNP in the last 8760 hours.   Other Studies Reviewed Today:  Myoview Study Highlights from 02/2016   Nuclear stress EF: 58%.  There was no ST segment deviation noted during  stress.  The study is normal.  This is a low risk study.    Echo Study Conclusions from 02/2016  - Left ventricle: The cavity size was normal. Wall thickness was increased in a pattern of mild LVH. Systolic function was normal. The estimated ejection fraction was in the range of 60% to 65%. Wall motion was normal; there were no regional wall motion abnormalities. Features are consistent with a pseudonormal left ventricular filling pattern, with concomitant abnormal relaxation and increased filling pressure (grade 2 diastolic dysfunction). Doppler parameters are consistent with high ventricular filling pressure. - Mitral valve: There was mild regurgitation. - Left atrium: The atrium was moderately dilated. - Pulmonary arteries: Systolic pressure was mildly increased. PA peak pressure: 37 mm Hg (S).  Impressions:  - Normal LV systolic function; grade 2 diastolic dysfunction with elevated LV filling pressure; mild MR; moderate LAE; trace TR with mildly elevated pulmonary pressure.  CT ANGIO CHEST/ABD/PELVIS IMPRESSION: 1. Patent suprarenal bifurcated aortic stent graft without endoleak, native sac diameter 5.3 cm. 2. Coronary and Aortic Atherosclerosis (ICD10-170.0) without dissection. 3. Descending and sigmoid diverticulosis.   Electronically Signed By: Lucrezia Europe M.D. On: 05/16/2016 11:02    Assessment/Plan:  1.HTN - BP remains elevated - recheck by me is 160/80. Adding Hydralazine 25 mg BID. I have asked to try and check some readings at home - he has a machine - would like to get him at least under 644 systolic.  2. HLD - on statin - labs checked by PCP - done back in April.   3. Long standing tobacco - not interested in stopping.   4. Prior AAA/FEVAR repair with stenting of the left renal artery - followed by [facility].   5. Cough - if fails to improve he needs to see PCP and get CXR  6. Insomnia - would not recommend  sleeping pills. Ok to try some melatonin. I think a lot of this is stress related.     Current medicines are reviewed with the patient  today.  The patient does not have concerns regarding medicines other than what has been noted above.  The following changes have been made:  See above.  Labs/ tests ordered today include:    Orders Placed This Encounter  Procedures  . Basic metabolic panel     Disposition:   FU with me in about 3 months.   Patient is agreeable to this plan and will call if any problems develop in the interim.   SignedTruitt Merle, NP  03/18/2017 10:37 AM  [facility] [address] [place], [place]  [address] Phone: [phone] Fax: [phone]

## 2017-03-20 NOTE — Progress Notes (Signed)
Noted.   Tivis Wherry 

## 2017-03-23 DIAGNOSIS — M25562 Pain in left knee: Secondary | ICD-10-CM | POA: Diagnosis not present

## 2017-04-07 DIAGNOSIS — G4733 Obstructive sleep apnea (adult) (pediatric): Secondary | ICD-10-CM | POA: Diagnosis not present

## 2017-04-07 DIAGNOSIS — R269 Unspecified abnormalities of gait and mobility: Secondary | ICD-10-CM | POA: Diagnosis not present

## 2017-04-07 DIAGNOSIS — I714 Abdominal aortic aneurysm, without rupture: Secondary | ICD-10-CM | POA: Diagnosis not present

## 2017-04-12 ENCOUNTER — Emergency Department (HOSPITAL_COMMUNITY)
Admission: EM | Admit: 2017-04-12 | Discharge: 2017-04-12 | Disposition: A | Discharge status: Home / Self Care | Payer: Medicare HMO | Attending: Emergency Medicine | Admitting: Emergency Medicine

## 2017-04-12 ENCOUNTER — Emergency Department (HOSPITAL_COMMUNITY): Payer: Medicare HMO

## 2017-04-12 ENCOUNTER — Encounter (HOSPITAL_COMMUNITY): Payer: Self-pay | Admitting: Emergency Medicine

## 2017-04-12 DIAGNOSIS — Z23 Encounter for immunization: Secondary | ICD-10-CM | POA: Diagnosis not present

## 2017-04-12 DIAGNOSIS — T1490XA Injury, unspecified, initial encounter: Secondary | ICD-10-CM

## 2017-04-12 DIAGNOSIS — Z79899 Other long term (current) drug therapy: Secondary | ICD-10-CM | POA: Insufficient documentation

## 2017-04-12 DIAGNOSIS — F17210 Nicotine dependence, cigarettes, uncomplicated: Secondary | ICD-10-CM | POA: Diagnosis not present

## 2017-04-12 DIAGNOSIS — Z7902 Long term (current) use of antithrombotics/antiplatelets: Secondary | ICD-10-CM | POA: Diagnosis not present

## 2017-04-12 DIAGNOSIS — W228XXA Striking against or struck by other objects, initial encounter: Secondary | ICD-10-CM | POA: Insufficient documentation

## 2017-04-12 DIAGNOSIS — L03116 Cellulitis of left lower limb: Secondary | ICD-10-CM | POA: Insufficient documentation

## 2017-04-12 DIAGNOSIS — Y929 Unspecified place or not applicable: Secondary | ICD-10-CM | POA: Insufficient documentation

## 2017-04-12 DIAGNOSIS — S80912A Unspecified superficial injury of left knee, initial encounter: Secondary | ICD-10-CM | POA: Diagnosis not present

## 2017-04-12 DIAGNOSIS — Y998 Other external cause status: Secondary | ICD-10-CM | POA: Insufficient documentation

## 2017-04-12 DIAGNOSIS — Y9389 Activity, other specified: Secondary | ICD-10-CM | POA: Insufficient documentation

## 2017-04-12 DIAGNOSIS — E119 Type 2 diabetes mellitus without complications: Secondary | ICD-10-CM | POA: Insufficient documentation

## 2017-04-12 DIAGNOSIS — S8992XA Unspecified injury of left lower leg, initial encounter: Secondary | ICD-10-CM | POA: Diagnosis present

## 2017-04-12 DIAGNOSIS — I10 Essential (primary) hypertension: Secondary | ICD-10-CM | POA: Diagnosis not present

## 2017-04-12 DIAGNOSIS — Z7984 Long term (current) use of oral hypoglycemic drugs: Secondary | ICD-10-CM | POA: Diagnosis not present

## 2017-04-12 DIAGNOSIS — J449 Chronic obstructive pulmonary disease, unspecified: Secondary | ICD-10-CM | POA: Diagnosis not present

## 2017-04-12 DIAGNOSIS — M25562 Pain in left knee: Secondary | ICD-10-CM | POA: Diagnosis not present

## 2017-04-12 DIAGNOSIS — M25572 Pain in left ankle and joints of left foot: Secondary | ICD-10-CM | POA: Diagnosis not present

## 2017-04-12 DIAGNOSIS — S99912A Unspecified injury of left ankle, initial encounter: Secondary | ICD-10-CM | POA: Diagnosis not present

## 2017-04-12 MED ORDER — CLINDAMYCIN HCL 300 MG PO CAPS: 300.0000 mg | ORAL_CAPSULE | Freq: Four times a day (QID) | ORAL | 0 refills | Status: DC

## 2017-04-12 MED ORDER — TETANUS-DIPHTH-ACELL PERTUSSIS 5-2.5-18.5 LF-MCG/0.5 IM SUSP
0.5000 mL | Freq: Once | INTRAMUSCULAR | Status: AC
  Administered 2017-04-12: 0.5 mL via INTRAMUSCULAR
  Filled 2017-04-12: qty 0.5

## 2017-04-12 MED ORDER — CLINDAMYCIN HCL 150 MG PO CAPS
300.0000 mg | ORAL_CAPSULE | Freq: Once | ORAL | Status: AC
  Administered 2017-04-12: 300 mg via ORAL
  Filled 2017-04-12: qty 2

## 2017-04-12 NOTE — ED Provider Notes (Signed)
[facility] EMERGENCY DEPARTMENT Provider Note   CSN: [id number] Arrival date & time: 04/12/17  1149     History   Chief Complaint Chief Complaint  Patient presents with  . Knee Pain    HPI Kevin Frank is a 76 y.o. male presenting with increased pain, swelling, redness and bruising of his left lower leg, foot and ankle after direct blow occurring one week ago.  He was helping roll a generator when the generator jack hit his left lower leg, causing a deep abrasion.  The site has since scabbed but he continues to have swelling, bruising and now increased pain x 3 days. He also reports having left knee bursitis treated recently with a steroid injection which has also flared.  He is scheduled to see his pcp tomorrow but knew he would need an xray so presents here.  The history is provided by the patient and the spouse.    Past Medical History:  Diagnosis Date  . AAA (abdominal aortic aneurysm) ([place]) 01/2008   3.2cm  . BPH (benign prostatic hyperplasia)   . COPD (chronic obstructive pulmonary disease) ([place])   . Diabetes mellitus    type II  . Gout   . History of kidney stones   . Hypercholesterolemia   . Hyperlipidemia   . Hypertension   . Normal nuclear stress test 2011  . Obesity   . Obstructive sleep apnea on CPAP   . Presence of indwelling urinary catheter   . Shortness of breath    due to cigarette abuse  . Tobacco abuse     Patient Active Problem List   Diagnosis Date Noted  . Smoking greater than 40 pack years 03/05/2016  . Incidental lung nodule, > 80mm and < 32mm 03/05/2016  . Stage 2 moderate COPD by GOLD classification ([place]) 03/05/2016  . AAA (abdominal aortic aneurysm) without rupture ([place]) 06/14/2015  . OBSTRUCTIVE SLEEP APNEA 06/09/2007  . HYPERCHOLESTEROLEMIA 05/06/2007  . GOUT 05/06/2007  . DEPRESSION 05/06/2007  . Hypertension, essential 05/06/2007  . ABDOMINAL AORTIC ANEURYSM 05/06/2007  . BENIGN PROSTATIC HYPERTROPHY, HX OF 05/06/2007     Past Surgical History:  Procedure Laterality Date  . ABDOMINAL AORTIC ENDOVASCULAR FENESTRATED STENT GRAFT N/A 04/09/2016   Procedure: ABDOMINAL AORTIC ENDOVASCULAR FENESTRATED STENT GRAFT with left brachial artery access using ultrasound guidance;  Surgeon: Serafina Mitchell, MD;  Location: [place];  Service: Vascular;  Laterality: N/A;  . [place]   [place]  . PERIPHERAL VASCULAR CATHETERIZATION N/A 04/10/2016   Procedure: Renal Angiography;  Surgeon: Angelia Mould, MD;  Location: [place] [facility];  Service: Cardiovascular;  Laterality: N/A;  . TRANSURETHRAL RESECTION OF PROSTATE  01/07/2011   Procedure: TRANSURETHRAL RESECTION OF THE PROSTATE (TURP);  Surgeon: Marissa Nestle;  Location: [facility];  Service: Urology;  Laterality: N/A;       Home Medications    Prior to Admission medications   Medication Sig Start Date End Date Taking? Authorizing Provider  albuterol (PROVENTIL HFA;VENTOLIN HFA) 108 (90 BASE) MCG/ACT inhaler Inhale 2 puffs into the lungs every 6 (six) hours as needed (rescue inhaler).     [provider]  allopurinol (ZYLOPRIM) 300 MG tablet Take 300 mg by mouth daily.      [provider]  amLODipine (NORVASC) 10 MG tablet Take 1 tablet (10 mg total) by mouth daily. 03/11/17   Sherren Mocha, MD  atorvastatin (LIPITOR) 20 MG tablet Take 20 mg by mouth daily.  10/13/13   [provider]  azelastine (ASTELIN) 0.1 % nasal spray Place 2 sprays into both nostrils 2 (two) times daily. 04/14/16   Rhyne, Hulen Shouts, PA-C  budesonide-formoterol (SYMBICORT) 160-4.5 MCG/ACT inhaler Inhale 2 puffs into the lungs 2 (two) times daily. 11/27/15   Gean Quint, MD  clindamycin (CLEOCIN) 300 MG capsule Take 1 capsule (300 mg total) by mouth every 6 (six) hours. 04/12/17   Evalee Jefferson, PA-C  clopidogrel (PLAVIX) 75 MG tablet TAKE (1) TABLET BY MOUTH ONCE DAILY. 03/04/17   Waynetta Sandy, MD  furosemide (LASIX) 40 MG tablet Take 40  mg by mouth daily.      [provider]  hydrALAZINE (APRESOLINE) 25 MG tablet Take 1 tablet (25 mg total) by mouth 2 (two) times daily. 03/18/17 06/16/17  Burtis Junes, NP  losartan (COZAAR) 100 MG tablet Take 1 tablet (100 mg total) by mouth daily. 02/09/17 05/10/17  Burtis Junes, NP  metFORMIN (GLUCOPHAGE) 500 MG tablet Take 500 mg by mouth 2 (two) times daily with a meal.  05/31/16   [provider]  metoprolol succinate (TOPROL-XL) 25 MG 24 hr tablet Take 1 tablet (25 mg total) by mouth daily. 03/11/17   Sherren Mocha, MD  sertraline (ZOLOFT) 50 MG tablet Take 50 mg by mouth daily.      [provider]  SPIRIVA HANDIHALER 18 MCG inhalation capsule Place 18 mcg into inhaler and inhale daily.  10/28/10   [provider]    Family History Family History  Problem Relation Age of Onset  . Liver disease Father   . Lymphoma Mother   . Diabetes Mother   . Hypertension Mother   . Cancer Mother   . Mental retardation Unknown        sibling -- from birth     Social History Social History  Substance Use Topics  . Smoking status: Current Every Day Smoker    Packs/day: 2.00    Years: 50.00    Types: Cigarettes  . Smokeless tobacco: Never Used     Comment: 2ppd 10/30/2016 ee  . Alcohol use 0.0 oz/week     Comment: occasional     Allergies   No known allergies   Review of Systems Review of Systems  Constitutional: Negative for fever.  Musculoskeletal: Positive for arthralgias. Negative for joint swelling and myalgias.  Skin: Positive for color change and wound.  Neurological: Negative for weakness and numbness.     Physical Exam Updated Vital Signs BP (!) 159/80 (BP Location: Left Arm)   Pulse 60   Temp 98.5 F (36.9 C) (Oral)   Resp 18   Ht 5\' 10"  (1.778 m)   Wt 95.3 kg (210 lb)   SpO2 96%   BMI 30.13 kg/m   Physical Exam  Constitutional: He appears well-developed and well-nourished.  HENT:  Head: Atraumatic.  Neck: Normal  range of motion.  Cardiovascular:  Pulses:      Dorsalis pedis pulses are 2+ on the right side, and 2+ on the left side.  Pulses equal bilaterally  Musculoskeletal: He exhibits tenderness.       Left ankle: He exhibits swelling and ecchymosis. He exhibits normal pulse. No proximal fibula tenderness found. Achilles tendon normal.  Edema and bruising noted left lower leg, ankle and dorsal foot. Negative homans.  Neurological: He is alert. He has normal strength. He displays normal reflexes. No sensory deficit.  Skin: Skin is warm and dry.  2 cm x 1 cm scabbed abrasion left posterolateral calf.  Mild surrounding erythema with increased warmth.  Psychiatric: He has a normal mood and affect.     ED Treatments / Results  Labs (all labs ordered are listed, but only abnormal results are displayed) Labs Reviewed - No data to display  EKG  EKG Interpretation None       Radiology Dg Ankle Complete Left  Result Date: 04/12/2017 CLINICAL DATA:  Injury.  Ankle pain EXAM: LEFT ANKLE COMPLETE - 3+ VIEW COMPARISON:  None. FINDINGS: Normal alignment. Normal ankle joint. Negative for fracture. Bilateral soft tissue swelling. Negative for joint effusion. IMPRESSION: Negative for fracture Electronically Signed   By: Franchot Gallo M.D.   On: 04/12/2017 14:03   Dg Knee Complete 4 Views Left  Result Date: 04/12/2017 CLINICAL DATA:  Knee pain ankle pain EXAM: LEFT KNEE - COMPLETE 4+ VIEW COMPARISON:  None. FINDINGS: Negative for fracture or effusion. Bilateral chondrocalcinosis. No significant joint space narrowing. IMPRESSION: Chondrocalcinosis.  No acute abnormality. Electronically Signed   By: Franchot Gallo M.D.   On: 04/12/2017 13:48     Procedures Procedures (including critical care time)  Medications Ordered in ED Medications  clindamycin (CLEOCIN) capsule 300 mg (not administered)  Tdap (BOOSTRIX) injection 0.5 mL (not administered)     Initial Impression / Assessment and Plan / ED  Course  I have reviewed the triage vital signs and the nursing notes.  Pertinent labs & imaging results that were available during my care of the patient were reviewed by me and considered in my medical decision making (see chart for details).    Pt with direct blow to left lower extremity with extensive bruising, edema, probable early cellulitis from site of deep abrasion.  Clindamycin, heat tx, elevation. Pt has f/u with pcp tomorrow for recheck.   Pt was seen by Dr Venora Maples during this visit.  Final Clinical Impressions(s) / ED Diagnoses   Final diagnoses:  Injury  Cellulitis of left lower extremity    New Prescriptions New Prescriptions   CLINDAMYCIN (CLEOCIN) 300 MG CAPSULE    Take 1 capsule (300 mg total) by mouth every 6 (six) hours.     Evalee Jefferson, PA-C 04/12/17 [place]    Jola Schmidt, MD 04/16/17 [phone]

## 2017-04-12 NOTE — Discharge Instructions (Signed)
Take the medicine as prescribed.  Use elevation and a warm (but not hot) heating pad for 15 minute episodes several times daily.  Plan a follow up with your doctor tomorrow as planned.  Your xrays are negative today for bony injury.

## 2017-04-12 NOTE — ED Notes (Signed)
To radiology

## 2017-04-12 NOTE — ED Notes (Signed)
Awaiting rad result

## 2017-04-12 NOTE — ED Notes (Signed)
Moving a generator with his neighbor last weekend and tore a hold in his L ankle  Now with redness, swelling and "tightness" up to his knee- Area is scabbed with 3-4 in redness to his lower leg-  Ankle is swollen  He also reports pain to his L knee with movement L knee is not swollen, but is tender to palpation to the upper patellar tendon

## 2017-04-12 NOTE — ED Triage Notes (Signed)
Patient c/o left knee and ankle pain. Per patient injured knee and ankle while moving a generator a week ago. Patient states swelling and pain that is progressively getting worse. Patient states that jack thrown into leg. CNS intact. Patient ambulatory.

## 2017-04-12 NOTE — ED Notes (Signed)
Awaiting rad report

## 2017-04-12 NOTE — ED Notes (Signed)
Kevin Frank has seen

## 2017-04-13 DIAGNOSIS — Z23 Encounter for immunization: Secondary | ICD-10-CM | POA: Diagnosis not present

## 2017-04-13 DIAGNOSIS — J449 Chronic obstructive pulmonary disease, unspecified: Secondary | ICD-10-CM | POA: Diagnosis not present

## 2017-04-13 DIAGNOSIS — E1165 Type 2 diabetes mellitus with hyperglycemia: Secondary | ICD-10-CM | POA: Diagnosis not present

## 2017-04-13 DIAGNOSIS — F17200 Nicotine dependence, unspecified, uncomplicated: Secondary | ICD-10-CM | POA: Diagnosis not present

## 2017-04-20 ENCOUNTER — Encounter: Payer: Self-pay | Admitting: Family Medicine

## 2017-04-20 DIAGNOSIS — J449 Chronic obstructive pulmonary disease, unspecified: Secondary | ICD-10-CM | POA: Diagnosis not present

## 2017-04-20 DIAGNOSIS — E1165 Type 2 diabetes mellitus with hyperglycemia: Secondary | ICD-10-CM | POA: Diagnosis not present

## 2017-04-20 DIAGNOSIS — I10 Essential (primary) hypertension: Secondary | ICD-10-CM | POA: Diagnosis not present

## 2017-04-20 DIAGNOSIS — F17200 Nicotine dependence, unspecified, uncomplicated: Secondary | ICD-10-CM | POA: Diagnosis not present

## 2017-04-21 DIAGNOSIS — J449 Chronic obstructive pulmonary disease, unspecified: Secondary | ICD-10-CM | POA: Diagnosis not present

## 2017-04-21 DIAGNOSIS — L03116 Cellulitis of left lower limb: Secondary | ICD-10-CM | POA: Diagnosis not present

## 2017-04-21 DIAGNOSIS — I10 Essential (primary) hypertension: Secondary | ICD-10-CM | POA: Diagnosis not present

## 2017-04-21 DIAGNOSIS — E119 Type 2 diabetes mellitus without complications: Secondary | ICD-10-CM | POA: Diagnosis not present

## 2017-04-23 ENCOUNTER — Emergency Department (HOSPITAL_COMMUNITY): Payer: Medicare HMO

## 2017-04-23 ENCOUNTER — Inpatient Hospital Stay (HOSPITAL_COMMUNITY)
Admission: EM | Admit: 2017-04-23 | Discharge: 2017-04-26 | DRG: 603 | Disposition: A | Discharge status: Home / Self Care | Payer: Medicare HMO | Attending: Pulmonary Disease | Admitting: Pulmonary Disease

## 2017-04-23 ENCOUNTER — Encounter (HOSPITAL_COMMUNITY): Payer: Self-pay | Admitting: Emergency Medicine

## 2017-04-23 DIAGNOSIS — I4519 Other right bundle-branch block: Secondary | ICD-10-CM | POA: Diagnosis not present

## 2017-04-23 DIAGNOSIS — G8929 Other chronic pain: Secondary | ICD-10-CM | POA: Diagnosis not present

## 2017-04-23 DIAGNOSIS — Z7902 Long term (current) use of antithrombotics/antiplatelets: Secondary | ICD-10-CM

## 2017-04-23 DIAGNOSIS — N179 Acute kidney failure, unspecified: Secondary | ICD-10-CM | POA: Diagnosis present

## 2017-04-23 DIAGNOSIS — Z6830 Body mass index (BMI) 30.0-30.9, adult: Secondary | ICD-10-CM

## 2017-04-23 DIAGNOSIS — H9190 Unspecified hearing loss, unspecified ear: Secondary | ICD-10-CM | POA: Diagnosis present

## 2017-04-23 DIAGNOSIS — M25569 Pain in unspecified knee: Secondary | ICD-10-CM

## 2017-04-23 DIAGNOSIS — M25561 Pain in right knee: Secondary | ICD-10-CM | POA: Diagnosis not present

## 2017-04-23 DIAGNOSIS — L0390 Cellulitis, unspecified: Secondary | ICD-10-CM | POA: Diagnosis present

## 2017-04-23 DIAGNOSIS — M109 Gout, unspecified: Secondary | ICD-10-CM | POA: Diagnosis present

## 2017-04-23 DIAGNOSIS — Z95828 Presence of other vascular implants and grafts: Secondary | ICD-10-CM

## 2017-04-23 DIAGNOSIS — M7989 Other specified soft tissue disorders: Secondary | ICD-10-CM | POA: Diagnosis not present

## 2017-04-23 DIAGNOSIS — W319XXA Contact with unspecified machinery, initial encounter: Secondary | ICD-10-CM | POA: Diagnosis not present

## 2017-04-23 DIAGNOSIS — E785 Hyperlipidemia, unspecified: Secondary | ICD-10-CM | POA: Diagnosis present

## 2017-04-23 DIAGNOSIS — I452 Bifascicular block: Secondary | ICD-10-CM | POA: Diagnosis not present

## 2017-04-23 DIAGNOSIS — E669 Obesity, unspecified: Secondary | ICD-10-CM | POA: Diagnosis present

## 2017-04-23 DIAGNOSIS — Z79899 Other long term (current) drug therapy: Secondary | ICD-10-CM

## 2017-04-23 DIAGNOSIS — T501X5A Adverse effect of loop [high-ceiling] diuretics, initial encounter: Secondary | ICD-10-CM | POA: Diagnosis present

## 2017-04-23 DIAGNOSIS — M549 Dorsalgia, unspecified: Secondary | ICD-10-CM | POA: Diagnosis present

## 2017-04-23 DIAGNOSIS — M25461 Effusion, right knee: Secondary | ICD-10-CM

## 2017-04-23 DIAGNOSIS — I110 Hypertensive heart disease with heart failure: Secondary | ICD-10-CM | POA: Diagnosis not present

## 2017-04-23 DIAGNOSIS — Z6831 Body mass index (BMI) 31.0-31.9, adult: Secondary | ICD-10-CM

## 2017-04-23 DIAGNOSIS — E861 Hypovolemia: Secondary | ICD-10-CM | POA: Diagnosis not present

## 2017-04-23 DIAGNOSIS — M1120 Other chondrocalcinosis, unspecified site: Secondary | ICD-10-CM | POA: Diagnosis present

## 2017-04-23 DIAGNOSIS — S80812A Abrasion, left lower leg, initial encounter: Secondary | ICD-10-CM | POA: Diagnosis present

## 2017-04-23 DIAGNOSIS — L03116 Cellulitis of left lower limb: Principal | ICD-10-CM

## 2017-04-23 DIAGNOSIS — I5032 Chronic diastolic (congestive) heart failure: Secondary | ICD-10-CM | POA: Diagnosis present

## 2017-04-23 DIAGNOSIS — E119 Type 2 diabetes mellitus without complications: Secondary | ICD-10-CM | POA: Diagnosis not present

## 2017-04-23 DIAGNOSIS — G4733 Obstructive sleep apnea (adult) (pediatric): Secondary | ICD-10-CM | POA: Diagnosis not present

## 2017-04-23 DIAGNOSIS — N400 Enlarged prostate without lower urinary tract symptoms: Secondary | ICD-10-CM | POA: Diagnosis present

## 2017-04-23 DIAGNOSIS — J449 Chronic obstructive pulmonary disease, unspecified: Secondary | ICD-10-CM | POA: Diagnosis not present

## 2017-04-23 DIAGNOSIS — F17210 Nicotine dependence, cigarettes, uncomplicated: Secondary | ICD-10-CM | POA: Diagnosis present

## 2017-04-23 DIAGNOSIS — Z7951 Long term (current) use of inhaled steroids: Secondary | ICD-10-CM

## 2017-04-23 DIAGNOSIS — Z8679 Personal history of other diseases of the circulatory system: Secondary | ICD-10-CM

## 2017-04-23 DIAGNOSIS — R609 Edema, unspecified: Secondary | ICD-10-CM

## 2017-04-23 DIAGNOSIS — Z9989 Dependence on other enabling machines and devices: Secondary | ICD-10-CM

## 2017-04-23 DIAGNOSIS — Z7984 Long term (current) use of oral hypoglycemic drugs: Secondary | ICD-10-CM

## 2017-04-23 DIAGNOSIS — I10 Essential (primary) hypertension: Secondary | ICD-10-CM | POA: Diagnosis not present

## 2017-04-23 HISTORY — DX: Other chronic pain: G89.29

## 2017-04-23 HISTORY — DX: Pain in unspecified knee: M25.569

## 2017-04-23 LAB — CBC WITH DIFFERENTIAL/PLATELET
Basophils Absolute: 0 10*3/uL (ref 0.0–0.1)
Basophils Relative: 0 %
Eosinophils Absolute: 0.2 10*3/uL (ref 0.0–0.7)
Eosinophils Relative: 2 %
HEMATOCRIT: 44 % (ref 39.0–52.0)
Hemoglobin: 15.3 g/dL (ref 13.0–17.0)
LYMPHS PCT: 19 %
Lymphs Abs: 2.1 10*3/uL (ref 0.7–4.0)
MCH: 33.3 pg (ref 26.0–34.0)
MCHC: 34.8 g/dL (ref 30.0–36.0)
MCV: 95.9 fL (ref 78.0–100.0)
MONO ABS: 1 10*3/uL (ref 0.1–1.0)
MONOS PCT: 9 %
NEUTROS ABS: 7.5 10*3/uL (ref 1.7–7.7)
Neutrophils Relative %: 70 %
Platelets: 215 10*3/uL (ref 150–400)
RBC: 4.59 MIL/uL (ref 4.22–5.81)
RDW: 14.2 % (ref 11.5–15.5)
WBC: 10.9 10*3/uL — ABNORMAL HIGH (ref 4.0–10.5)

## 2017-04-23 LAB — BASIC METABOLIC PANEL
Anion gap: 10 (ref 5–15)
BUN: 23 mg/dL — ABNORMAL HIGH (ref 6–20)
CALCIUM: 9.2 mg/dL (ref 8.9–10.3)
CO2: 26 mmol/L (ref 22–32)
CREATININE: 1.49 mg/dL — AB (ref 0.61–1.24)
Chloride: 101 mmol/L (ref 101–111)
GFR calc non Af Amer: 44 mL/min — ABNORMAL LOW (ref >60)
GFR, EST AFRICAN AMERICAN: 51 mL/min — AB (ref >60)
GLUCOSE: 109 mg/dL — AB (ref 65–99)
Potassium: 4.5 mmol/L (ref 3.5–5.1)
Sodium: 137 mmol/L (ref 135–145)

## 2017-04-23 LAB — TROPONIN I: Troponin I: <0.03 ng/mL (ref <0.03)

## 2017-04-23 LAB — LACTIC ACID, PLASMA
Lactic Acid, Venous: 1.4 mmol/L (ref 0.5–1.9)
Lactic Acid, Venous: 1.6 mmol/L (ref 0.5–1.9)

## 2017-04-23 MED ORDER — HYDROCODONE-ACETAMINOPHEN 5-325 MG PO TABS
1.0000 | ORAL_TABLET | ORAL | Status: DC | PRN
  Administered 2017-04-23 – 2017-04-24 (×2): 2 via ORAL
  Filled 2017-04-23 (×2): qty 2

## 2017-04-23 MED ORDER — CLOPIDOGREL BISULFATE 75 MG PO TABS
75.0000 mg | ORAL_TABLET | Freq: Every day | ORAL | Status: DC
  Administered 2017-04-24 – 2017-04-26 (×3): 75 mg via ORAL
  Filled 2017-04-23 (×3): qty 1

## 2017-04-23 MED ORDER — METOPROLOL SUCCINATE ER 25 MG PO TB24
25.0000 mg | ORAL_TABLET | Freq: Every day | ORAL | Status: DC
  Administered 2017-04-24 – 2017-04-26 (×3): 25 mg via ORAL
  Filled 2017-04-23 (×3): qty 1

## 2017-04-23 MED ORDER — MORPHINE SULFATE (PF) 2 MG/ML IV SOLN: 2.0000 mg | INTRAVENOUS | Status: DC | PRN

## 2017-04-23 MED ORDER — SODIUM CHLORIDE 0.9 % IV SOLN
INTRAVENOUS | Status: AC
  Administered 2017-04-23: 22:00:00 via INTRAVENOUS

## 2017-04-23 MED ORDER — VANCOMYCIN HCL IN DEXTROSE 1-5 GM/200ML-% IV SOLN
1000.0000 mg | Freq: Once | INTRAVENOUS | Status: AC
  Administered 2017-04-23: 1000 mg via INTRAVENOUS
  Filled 2017-04-23: qty 200

## 2017-04-23 MED ORDER — NICOTINE 21 MG/24HR TD PT24
21.0000 mg | MEDICATED_PATCH | Freq: Every day | TRANSDERMAL | Status: DC
  Administered 2017-04-23 – 2017-04-26 (×4): 21 mg via TRANSDERMAL
  Filled 2017-04-23 (×4): qty 1

## 2017-04-23 MED ORDER — BACLOFEN 10 MG PO TABS
10.0000 mg | ORAL_TABLET | Freq: Two times a day (BID) | ORAL | Status: DC
  Administered 2017-04-23 – 2017-04-26 (×6): 10 mg via ORAL
  Filled 2017-04-23 (×6): qty 1

## 2017-04-23 MED ORDER — MORPHINE SULFATE (PF) 4 MG/ML IV SOLN
4.0000 mg | INTRAVENOUS | Status: AC | PRN
  Administered 2017-04-23 (×2): 4 mg via INTRAVENOUS
  Filled 2017-04-23 (×2): qty 1

## 2017-04-23 MED ORDER — AZELASTINE HCL 0.1 % NA SOLN
2.0000 | Freq: Two times a day (BID) | NASAL | Status: DC
  Administered 2017-04-24 – 2017-04-26 (×4): 2 via NASAL
  Filled 2017-04-23: qty 30

## 2017-04-23 MED ORDER — MOMETASONE FURO-FORMOTEROL FUM 200-5 MCG/ACT IN AERO
2.0000 | INHALATION_SPRAY | Freq: Two times a day (BID) | RESPIRATORY_TRACT | Status: DC
  Administered 2017-04-24 – 2017-04-26 (×5): 2 via RESPIRATORY_TRACT
  Filled 2017-04-23: qty 8.8

## 2017-04-23 MED ORDER — HYDRALAZINE HCL 25 MG PO TABS
25.0000 mg | ORAL_TABLET | Freq: Two times a day (BID) | ORAL | Status: DC
  Administered 2017-04-23 – 2017-04-26 (×6): 25 mg via ORAL
  Filled 2017-04-23 (×6): qty 1

## 2017-04-23 MED ORDER — ONDANSETRON HCL 4 MG PO TABS: 4.0000 mg | ORAL_TABLET | Freq: Four times a day (QID) | ORAL | Status: DC | PRN

## 2017-04-23 MED ORDER — ONDANSETRON HCL 4 MG/2ML IJ SOLN: 4.0000 mg | Freq: Four times a day (QID) | INTRAMUSCULAR | Status: DC | PRN

## 2017-04-23 MED ORDER — ALLOPURINOL 300 MG PO TABS
300.0000 mg | ORAL_TABLET | Freq: Every day | ORAL | Status: DC
  Administered 2017-04-24 – 2017-04-26 (×3): 300 mg via ORAL
  Filled 2017-04-23 (×3): qty 1

## 2017-04-23 MED ORDER — BISACODYL 5 MG PO TBEC: 5.0000 mg | DELAYED_RELEASE_TABLET | Freq: Every day | ORAL | Status: DC | PRN

## 2017-04-23 MED ORDER — AMLODIPINE BESYLATE 5 MG PO TABS
10.0000 mg | ORAL_TABLET | Freq: Every day | ORAL | Status: DC
  Administered 2017-04-24 – 2017-04-26 (×3): 10 mg via ORAL
  Filled 2017-04-23 (×3): qty 2

## 2017-04-23 MED ORDER — ONDANSETRON HCL 4 MG/2ML IJ SOLN
4.0000 mg | INTRAMUSCULAR | Status: AC | PRN
  Administered 2017-04-23 (×2): 4 mg via INTRAVENOUS
  Filled 2017-04-23 (×2): qty 2

## 2017-04-23 MED ORDER — DEXTROSE 5 % IV SOLN
1.0000 g | INTRAVENOUS | Status: DC
  Administered 2017-04-23 – 2017-04-25 (×3): 1 g via INTRAVENOUS
  Filled 2017-04-23 (×4): qty 10

## 2017-04-23 MED ORDER — ATORVASTATIN CALCIUM 20 MG PO TABS
20.0000 mg | ORAL_TABLET | Freq: Every day | ORAL | Status: DC
  Administered 2017-04-24 – 2017-04-25 (×2): 20 mg via ORAL
  Filled 2017-04-23 (×2): qty 1

## 2017-04-23 MED ORDER — SERTRALINE HCL 50 MG PO TABS
50.0000 mg | ORAL_TABLET | Freq: Every day | ORAL | Status: DC
  Administered 2017-04-24 – 2017-04-26 (×3): 50 mg via ORAL
  Filled 2017-04-23 (×3): qty 1

## 2017-04-23 MED ORDER — HEPARIN SODIUM (PORCINE) 5000 UNIT/ML IJ SOLN
5000.0000 [IU] | Freq: Three times a day (TID) | INTRAMUSCULAR | Status: DC
  Administered 2017-04-23 – 2017-04-26 (×7): 5000 [IU] via SUBCUTANEOUS
  Filled 2017-04-23 (×7): qty 1

## 2017-04-23 MED ORDER — ALBUTEROL SULFATE (2.5 MG/3ML) 0.083% IN NEBU: 3.0000 mL | INHALATION_SOLUTION | Freq: Four times a day (QID) | RESPIRATORY_TRACT | Status: DC | PRN

## 2017-04-23 MED ORDER — TIOTROPIUM BROMIDE MONOHYDRATE 18 MCG IN CAPS
18.0000 ug | ORAL_CAPSULE | Freq: Every day | RESPIRATORY_TRACT | Status: DC
  Administered 2017-04-24 – 2017-04-26 (×3): 18 ug via RESPIRATORY_TRACT
  Filled 2017-04-23: qty 5

## 2017-04-23 MED ORDER — SENNOSIDES-DOCUSATE SODIUM 8.6-50 MG PO TABS: 1.0000 | ORAL_TABLET | Freq: Every evening | ORAL | Status: DC | PRN

## 2017-04-23 MED ORDER — ACETAMINOPHEN 325 MG PO TABS: 650.0000 mg | ORAL_TABLET | Freq: Four times a day (QID) | ORAL | Status: DC | PRN

## 2017-04-23 MED ORDER — ACETAMINOPHEN 650 MG RE SUPP: 650.0000 mg | Freq: Four times a day (QID) | RECTAL | Status: DC | PRN

## 2017-04-23 NOTE — ED Provider Notes (Signed)
[facility] EMERGENCY DEPARTMENT Provider Note   CSN: [id number] Arrival date & time: 04/23/17  1710     History   Chief Complaint Chief Complaint  Patient presents with  . Knee Pain  . Rash  . Fatigue    HPI Kevin Frank is a 76 y.o. male.   Knee Pain    Rash      Pt was seen at 1745. Per pt and his family, c/o gradual onset and persistence of constant LLE "red rash" that began 1 week ago. Pt was evaluated in the ED 1 week ago for this rash, dx cellulitis, rx doxycycline. Pt states he has been taking the antibiotic as prescribed without significant improvement in his rash. Pt also c/o acute flair of his right knee "pain" for the past several days. Denies injury, no rash or swelling to knee, no calf pain/unilateral swelling. Pt also c/o generalized fatigue for the past several days. Denies fevers, no CP/palpitations, no SOB/cough, no abd pain, no N/V/D, no focal motor weakness, no tingling/numbness in extremities.   Past Medical History:  Diagnosis Date  . AAA (abdominal aortic aneurysm) ([place]) 01/2008   3.2cm  . BPH (benign prostatic hyperplasia)   . Chronic knee pain    bilateral  . COPD (chronic obstructive pulmonary disease) ([place])   . Diabetes mellitus    type II  . Gout   . History of kidney stones   . Hypercholesterolemia   . Hyperlipidemia   . Hypertension   . Normal nuclear stress test 2011  . Obesity   . Obstructive sleep apnea on CPAP   . Presence of indwelling urinary catheter   . Shortness of breath    due to cigarette abuse  . Tobacco abuse     Patient Active Problem List   Diagnosis Date Noted  . Smoking greater than 40 pack years 03/05/2016  . Incidental lung nodule, > 44mm and < 50mm 03/05/2016  . Stage 2 moderate COPD by GOLD classification ([place]) 03/05/2016  . AAA (abdominal aortic aneurysm) without rupture ([place]) 06/14/2015  . OBSTRUCTIVE SLEEP APNEA 06/09/2007  . HYPERCHOLESTEROLEMIA 05/06/2007  . GOUT 05/06/2007  . DEPRESSION 05/06/2007   . Hypertension, essential 05/06/2007  . ABDOMINAL AORTIC ANEURYSM 05/06/2007  . BENIGN PROSTATIC HYPERTROPHY, HX OF 05/06/2007    Past Surgical History:  Procedure Laterality Date  . ABDOMINAL AORTIC ENDOVASCULAR FENESTRATED STENT GRAFT N/A 04/09/2016   Procedure: ABDOMINAL AORTIC ENDOVASCULAR FENESTRATED STENT GRAFT with left brachial artery access using ultrasound guidance;  Surgeon: Serafina Mitchell, MD;  Location: [place];  Service: Vascular;  Laterality: N/A;  . [place]   [place]  . PERIPHERAL VASCULAR CATHETERIZATION N/A 04/10/2016   Procedure: Renal Angiography;  Surgeon: Angelia Mould, MD;  Location: [place] [facility];  Service: Cardiovascular;  Laterality: N/A;  . TRANSURETHRAL RESECTION OF PROSTATE  01/07/2011   Procedure: TRANSURETHRAL RESECTION OF THE PROSTATE (TURP);  Surgeon: Marissa Nestle;  Location: [facility];  Service: Urology;  Laterality: N/A;       Home Medications    Prior to Admission medications   Medication Sig Start Date End Date Taking? Authorizing Provider  albuterol (PROVENTIL HFA;VENTOLIN HFA) 108 (90 BASE) MCG/ACT inhaler Inhale 2 puffs into the lungs every 6 (six) hours as needed (rescue inhaler).    Yes [provider]  allopurinol (ZYLOPRIM) 300 MG tablet Take 300 mg by mouth daily.     Yes [provider]  amLODipine (NORVASC) 10 MG tablet Take 1 tablet (  10 mg total) by mouth daily. 03/11/17  Yes Sherren Mocha, MD  atorvastatin (LIPITOR) 20 MG tablet Take 20 mg by mouth daily.  10/13/13  Yes [provider]  azelastine (ASTELIN) 0.1 % nasal spray Place 2 sprays into both nostrils 2 (two) times daily. 04/14/16  Yes Rhyne, Hulen Shouts, PA-C  budesonide-formoterol (SYMBICORT) 160-4.5 MCG/ACT inhaler Inhale 2 puffs into the lungs 2 (two) times daily. 11/27/15  Yes Gean Quint, MD  clindamycin (CLEOCIN) 300 MG capsule Take 1 capsule (300 mg total) by mouth every 6 (six) hours. 04/12/17  Yes Idol, Almyra Free, PA-C    clopidogrel (PLAVIX) 75 MG tablet TAKE (1) TABLET BY MOUTH ONCE DAILY. 03/04/17  Yes Waynetta Sandy, MD  furosemide (LASIX) 40 MG tablet Take 40 mg by mouth daily.     Yes [provider]  hydrALAZINE (APRESOLINE) 25 MG tablet Take 1 tablet (25 mg total) by mouth 2 (two) times daily. 03/18/17 06/16/17 Yes Burtis Junes, NP  losartan (COZAAR) 100 MG tablet Take 1 tablet (100 mg total) by mouth daily. 02/09/17 05/10/17 Yes Burtis Junes, NP  metFORMIN (GLUCOPHAGE) 500 MG tablet Take 500 mg by mouth 2 (two) times daily with a meal.  05/31/16  Yes [provider]  metoprolol succinate (TOPROL-XL) 25 MG 24 hr tablet Take 1 tablet (25 mg total) by mouth daily. 03/11/17  Yes Sherren Mocha, MD  sertraline (ZOLOFT) 50 MG tablet Take 50 mg by mouth daily.     Yes [provider]  SPIRIVA HANDIHALER 18 MCG inhalation capsule Place 18 mcg into inhaler and inhale daily.  10/28/10  Yes [provider]    Family History Family History  Problem Relation Age of Onset  . Liver disease Father   . Lymphoma Mother   . Diabetes Mother   . Hypertension Mother   . Cancer Mother   . Mental retardation Unknown        sibling -- from birth     Social History Social History  Substance Use Topics  . Smoking status: Current Every Day Smoker    Packs/day: 2.00    Years: 50.00    Types: Cigarettes  . Smokeless tobacco: Never Used     Comment: 2ppd 10/30/2016 ee  . Alcohol use 0.0 oz/week     Comment: occasional     Allergies   No known allergies   Review of Systems Review of Systems  Skin: Positive for rash.  ROS: Statement: All systems negative except as marked or noted in the HPI; Constitutional: Negative for fever and chills. +generalized fatigue.; ; Eyes: Negative for eye pain, redness and discharge. ; ; ENMT: Negative for ear pain, hoarseness, nasal congestion, sinus pressure and sore throat. ; ; Cardiovascular: Negative for chest pain, palpitations,  diaphoresis, dyspnea and peripheral edema. ; ; Respiratory: Negative for cough, wheezing and stridor. ; ; Gastrointestinal: Negative for nausea, vomiting, diarrhea, abdominal pain, blood in stool, hematemesis, jaundice and rectal bleeding. . ; ; Genitourinary: Negative for dysuria, flank pain and hematuria. ; ; Musculoskeletal: +chronic right knee pain. Negative for back pain and neck pain. Negative for swelling and trauma.; ; Skin: +red rash LLE. Negative for pruritus, abrasions, blisters, bruising and skin lesion.; ; Neuro: Negative for headache, lightheadedness and neck stiffness. Negative for weakness, altered level of consciousness, altered mental status, extremity weakness, paresthesias, involuntary movement, seizure and syncope.       Physical Exam Updated Vital Signs BP 136/83   Pulse 64   Temp 99.2  F (37.3 C) (Oral)   Resp 17   Ht 5\' 10"  (1.778 m)   Wt 95.3 kg (210 lb)   SpO2 96%   BMI 30.13 kg/m   Physical Exam 1750: Physical examination:  Nursing notes reviewed; Vital signs and O2 SAT reviewed;  Constitutional: Well developed, Well nourished, Well hydrated, In no acute distress; Head:  Normocephalic, atraumatic; Eyes: EOMI, PERRL, No scleral icterus; ENMT: Mouth and pharynx normal, Mucous membranes moist; Neck: Supple, Full range of motion, No lymphadenopathy; Cardiovascular: Regular rate and rhythm, No gallop; Respiratory: Breath sounds clear & equal bilaterally, No wheezes.  Speaking full sentences with ease, Normal respiratory effort/excursion; Chest: Nontender, Movement normal; Abdomen: Soft, Nontender, Nondistended, Normal bowel sounds; Genitourinary: No CVA tenderness.; Spine:  No midline CS, TS, LS tenderness. No rash.;; Extremities: Pulses normal, +LLE erythema from foot to proximal tibial area, no open wounds. NT left knee/ankle/foot joints, FROM without pain. No deformity. +1 pedal edema bilat. No calf tenderness, or asymmetry.  +FROM right knee, including able to lift  extended RLE off stretcher, and extend right lower leg against resistance.  No ligamentous laxity.  No patellar or quad tendon step-offs.  NMS intact right foot, strong pedal pp. +plantarflexion of right foot w/calf squeeze.  No palpable gap right Achilles's tendon.  No proximal fibular head tenderness.  No edema, erythema, warmth, ecchymosis or deformity. Generalized TTP without specific area of point tenderness. NT right calf/ankle/foot..; Neuro: AA&Ox3, Major CN grossly intact.  Speech clear. No gross focal motor or sensory deficits in extremities.; Skin: Color normal, Warm, Dry.   ED Treatments / Results  Labs (all labs ordered are listed, but only abnormal results are displayed)   EKG  EKG Interpretation  Date/Time:  Thursday April 23 2017 19:20:17 EDT Ventricular Rate:  64 PR Interval:    QRS Duration: 114 QT Interval:  404 QTC Calculation: 417 R Axis:   -21 Text Interpretation:  Sinus rhythm Incomplete RBBB and LAFB Baseline wander Artifact No old tracing to compare Confirmed by Francine Graven [phone]) on 04/23/2017 7:38:09 PM       Radiology   Procedures Procedures (including critical care time)  Medications Ordered in ED Medications  morphine 4 MG/ML injection 4 mg (4 mg Intravenous Given 04/23/17 1836)  ondansetron (ZOFRAN) injection 4 mg (4 mg Intravenous Given 04/23/17 1836)     Initial Impression / Assessment and Plan / ED Course  I have reviewed the triage vital signs and the nursing notes.  Pertinent labs & imaging results that were available during my care of the patient were reviewed by me and considered in my medical decision making (see chart for details).  MDM Reviewed: previous chart, nursing note and vitals Reviewed previous: labs, x-ray and ECG Interpretation: labs, x-ray and ECG   Results for orders placed or performed during the hospital encounter of 04/23/17  Culture, blood (routine x 2)  Result Value Ref Range   Specimen Description RIGHT  ANTECUBITAL    Special Requests      BOTTLES DRAWN AEROBIC AND ANAEROBIC Blood Culture adequate volume   Culture PENDING    Report Status PENDING   Culture, blood (routine x 2)  Result Value Ref Range   Specimen Description LEFT ANTECUBITAL    Special Requests      BOTTLES DRAWN AEROBIC AND ANAEROBIC Blood Culture adequate volume   Culture PENDING    Report Status PENDING   Basic metabolic panel  Result Value Ref Range   Sodium 137 135 - 145 mmol/L  Potassium 4.5 3.5 - 5.1 mmol/L   Chloride 101 101 - 111 mmol/L   CO2 26 22 - 32 mmol/L   Glucose, Bld 109 (H) 65 - 99 mg/dL   BUN 23 (H) 6 - 20 mg/dL   Creatinine, Ser 1.49 (H) 0.61 - 1.24 mg/dL   Calcium 9.2 8.9 - 10.3 mg/dL   GFR calc non Af Amer 44 (L) >60 mL/min   GFR calc Af Amer 51 (L) >60 mL/min   Anion gap 10 5 - 15  Troponin I  Result Value Ref Range   Troponin I <0.03 <0.03 ng/mL  Lactic acid, plasma  Result Value Ref Range   Lactic Acid, Venous 1.4 0.5 - 1.9 mmol/L  Lactic acid, plasma  Result Value Ref Range   Lactic Acid, Venous 1.6 0.5 - 1.9 mmol/L  CBC with Differential  Result Value Ref Range   WBC 10.9 (H) 4.0 - 10.5 K/uL   RBC 4.59 4.22 - 5.81 MIL/uL   Hemoglobin 15.3 13.0 - 17.0 g/dL   HCT 44.0 39.0 - 52.0 %   MCV 95.9 78.0 - 100.0 fL   MCH 33.3 26.0 - 34.0 pg   MCHC 34.8 30.0 - 36.0 g/dL   RDW 14.2 11.5 - 15.5 %   Platelets 215 150 - 400 K/uL   Neutrophils Relative % 70 %   Neutro Abs 7.5 1.7 - 7.7 K/uL   Lymphocytes Relative 19 %   Lymphs Abs 2.1 0.7 - 4.0 K/uL   Monocytes Relative 9 %   Monocytes Absolute 1.0 0.1 - 1.0 K/uL   Eosinophils Relative 2 %   Eosinophils Absolute 0.2 0.0 - 0.7 K/uL   Basophils Relative 0 %   Basophils Absolute 0.0 0.0 - 0.1 K/uL   Dg Chest 2 View Result Date: 04/23/2017 CLINICAL DATA:  Left lower leg cellulitis. EXAM: CHEST  2 VIEW COMPARISON:  04/09/2016 FINDINGS: Lungs are hyperexpanded. The lungs are clear without focal pneumonia, edema, pneumothorax or pleural  effusion. Interstitial markings are diffusely coarsened with chronic features. Cardiopericardial silhouette is at upper limits of normal for size. Bones are diffusely demineralized. Telemetry leads overlie the chest. IMPRESSION: Hyperexpansion with chronic interstitial changes. No acute cardiopulmonary findings. Electronically Signed   By: Misty Stanley M.D.   On: 04/23/2017 20:17   Dg Knee Complete 4 Views Right Result Date: 04/23/2017 CLINICAL DATA:  Left lower leg cellulitis. EXAM: RIGHT KNEE - COMPLETE 4+ VIEW COMPARISON:  08/25/2014 FINDINGS: No fracture. No subluxation or dislocation. Meniscal calcification is evident. Small joint effusion noted. No worrisome lytic or sclerotic osseous abnormality IMPRESSION: Stable.  No acute findings. Electronically Signed   By: Misty Stanley M.D.   On: 04/23/2017 20:19    2055:  Failure of outpt abx, will tx IV vancomycin after BC obtained. Workup otherwise reassuring. Doubt septic right knee joint without erythema/edema. Dx and testing d/w pt and family.  Questions answered.  Verb understanding, agreeable to admit.  T/C to [facility] Dr. Myna Hidalgo, case discussed, including:  HPI, pertinent PM/SHx, VS/PE, dx testing, ED course and treatment:  Agreeable to admit.   Final Clinical Impressions(s) / ED Diagnoses   Final diagnoses:  None    New Prescriptions New Prescriptions   No medications on file      Francine Graven, DO 04/26/17 1354

## 2017-04-23 NOTE — ED Notes (Signed)
Checked on pt for urine sample,pt tired to use urinal but no luck.will try back 10 30 minutes.

## 2017-04-23 NOTE — ED Triage Notes (Addendum)
PT c/o left lower leg cellulitis being treated with doxycyline 100 mg bid with some improvement. PT now c/o right lower leg pain and knee pain with fever. PT family reports pt has been sleeping more than his normal and decreased activity at home.

## 2017-04-23 NOTE — H&P (Addendum)
History and Physical    KHUP SAPIA IOX:[id number] DOB: 02-16-1941 DOA: 04/23/2017  PCP: Sinda Du, MD   Patient coming from: Home  Chief Complaint: Fever, fatigue  HPI: Kevin Frank is a 76 y.o. male with medical history significant forhypertension, COPD, OSA, and type 2 diabetes mellitus, now presenting to the emergency department for evaluation of left lower extremity rash with fevers and lethargy despite outpatient antibiotics.  He reports suffering an abrasion to the lower left leg approximately 3 weeks ago while he was moving a heavy piece of equipment.  He was seen by his PCP, diagnosed with cellulitis, and started on doxycycline.  He was then evaluated in the emergency department approximately 10 days ago for redness, swelling, and pain in the left lower extremity despite the clindamycin, and was prescribed a course of doxycycline.  He reports fevers and lethargy at home with no significant improvement in the appearance to his right lower leg.  Denies chest pain or palpitations and denies dyspnea or cough.  Reports adherence with doxycycline. Reports recent worsening in his chronic right knee pain.  ED Course: Upon arrival to the ED, patient is found to be afebrile, saturating low 90s on room air, and with vitals otherwise stable.  EKG features a sinus rhythm with incomplete RBBB and LAFB.  Chest x-rays negative for acute cardiopulmonary disease and radiographs of the right knee are negative.  Chemistry panel is notable for a BUN of 23 and creatinine 1.49, up from 1.05 about a month ago.  CBC features a new leukocytosis to 10,900, troponin is undetectable, and lactic acid is normal.  Blood cultures were collected, morphine was given, and the patient was treated with vancomycin in the ED.  He remained hemodynamically stable and in no apparent respiratory distress, and will be admitted to the medical/surgical unit for ongoing evaluation and management of cellulitis without patient  treatment failure, and acute kidney injury.  Review of Systems:  All other systems reviewed and apart from HPI, are negative.  Past Medical History:  Diagnosis Date  . AAA (abdominal aortic aneurysm) ([place]) 01/2008   3.2cm  . BPH (benign prostatic hyperplasia)   . Chronic knee pain    bilateral  . COPD (chronic obstructive pulmonary disease) ([place])   . Diabetes mellitus    type II  . Gout   . History of kidney stones   . Hypercholesterolemia   . Hyperlipidemia   . Hypertension   . Normal nuclear stress test 2011  . Obesity   . Obstructive sleep apnea on CPAP   . Presence of indwelling urinary catheter   . Shortness of breath    due to cigarette abuse  . Tobacco abuse     Past Surgical History:  Procedure Laterality Date  . ABDOMINAL AORTIC ENDOVASCULAR FENESTRATED STENT GRAFT N/A 04/09/2016   Procedure: ABDOMINAL AORTIC ENDOVASCULAR FENESTRATED STENT GRAFT with left brachial artery access using ultrasound guidance;  Surgeon: Serafina Mitchell, MD;  Location: [facility];  Service: Vascular;  Laterality: N/A;  . [place]   [place]  . PERIPHERAL VASCULAR CATHETERIZATION N/A 04/10/2016   Procedure: Renal Angiography;  Surgeon: Angelia Mould, MD;  Location: [facility];  Service: Cardiovascular;  Laterality: N/A;  . TRANSURETHRAL RESECTION OF PROSTATE  01/07/2011   Procedure: TRANSURETHRAL RESECTION OF THE PROSTATE (TURP);  Surgeon: Marissa Nestle;  Location: [facility];  Service: Urology;  Laterality: N/A;     reports that he has been smoking Cigarettes.  He has  a 100.00 pack-year smoking history. He has never used smokeless tobacco. He reports that he drinks alcohol. He reports that he does not use drugs.  Allergies  Allergen Reactions  . No Known Allergies     Family History  Problem Relation Age of Onset  . Liver disease Father   . Lymphoma Mother   . Diabetes Mother   . Hypertension Mother   . Cancer Mother   . Mental retardation Unknown         sibling -- from birth      Prior to Admission medications   Medication Sig Start Date End Date Taking? Authorizing Provider  albuterol (PROVENTIL HFA;VENTOLIN HFA) 108 (90 BASE) MCG/ACT inhaler Inhale 2 puffs into the lungs every 6 (six) hours as needed (rescue inhaler).    Yes [provider]  allopurinol (ZYLOPRIM) 300 MG tablet Take 300 mg by mouth daily.     Yes [provider]  amLODipine (NORVASC) 10 MG tablet Take 1 tablet (10 mg total) by mouth daily. 03/11/17  Yes Sherren Mocha, MD  atorvastatin (LIPITOR) 20 MG tablet Take 20 mg by mouth daily.  10/13/13  Yes [provider]  azelastine (ASTELIN) 0.1 % nasal spray Place 2 sprays into both nostrils 2 (two) times daily. 04/14/16  Yes Rhyne, Samantha J, PA-C  baclofen (LIORESAL) 10 MG tablet Take 1 tablet by mouth 2 (two) times daily. 04/21/17  Yes [provider]  budesonide-formoterol (SYMBICORT) 160-4.5 MCG/ACT inhaler Inhale 2 puffs into the lungs 2 (two) times daily. 11/27/15  Yes Gean Quint, MD  clindamycin (CLEOCIN) 300 MG capsule Take 1 capsule (300 mg total) by mouth every 6 (six) hours. 04/12/17  Yes Idol, Almyra Free, PA-C  clopidogrel (PLAVIX) 75 MG tablet TAKE (1) TABLET BY MOUTH ONCE DAILY. 03/04/17  Yes Waynetta Sandy, MD  doxycycline (VIBRA-TABS) 100 MG tablet Take 1 tablet by mouth 2 (two) times daily. 04/17/17  Yes [provider]  furosemide (LASIX) 40 MG tablet Take 40 mg by mouth daily.     Yes [provider]  hydrALAZINE (APRESOLINE) 25 MG tablet Take 1 tablet (25 mg total) by mouth 2 (two) times daily. 03/18/17 06/16/17 Yes Burtis Junes, NP  losartan (COZAAR) 100 MG tablet Take 1 tablet (100 mg total) by mouth daily. 02/09/17 05/10/17 Yes Burtis Junes, NP  metFORMIN (GLUCOPHAGE) 500 MG tablet Take 500 mg by mouth 2 (two) times daily with a meal.  05/31/16  Yes [provider]  metoprolol succinate (TOPROL-XL) 25 MG 24 hr tablet Take 1 tablet  (25 mg total) by mouth daily. 03/11/17  Yes Sherren Mocha, MD  sertraline (ZOLOFT) 50 MG tablet Take 50 mg by mouth daily.     Yes [provider]  SPIRIVA HANDIHALER 18 MCG inhalation capsule Place 18 mcg into inhaler and inhale daily.  10/28/10  Yes [provider]    Physical Exam: Vitals:   04/23/17 1723 04/23/17 1812 04/23/17 1816 04/23/17 1930  BP: 140/85 136/83  123/66  Pulse: 68  64 69  Resp: 17   (!) 24  Temp: 99.2 F (37.3 C)     TempSrc: Oral     SpO2: 97%  96% 90%  Weight: 95.3 kg (210 lb)     Height: 5\' 10"  (1.778 m)         Constitutional: NAD, calm, comfortable Eyes: PERTLA, lids and conjunctivae normal ENMT: Mucous membranes are moist. Posterior pharynx clear of any exudate or lesions.   Neck: normal, supple,  no masses, no thyromegaly Respiratory: clear to auscultation bilaterally, no wheezing, no crackles. Normal respiratory effort. No accessory muscle use.  Cardiovascular: S1 & S2 heard, regular rate and rhythm, no significant murmurs / rubs / gallops. No extremity edema. 2+ pedal pulses. No carotid bruits. No significant JVD. Abdomen: No distension, no tenderness, no masses palpated. Bowel sounds normal.  Musculoskeletal: no clubbing / cyanosis. No joint deformity upper and lower extremities. Normal muscle tone.  Skin: no significant rashes, lesions, ulcers. Warm, dry, well-perfused. Neurologic: CN 2-12 grossly intact. Sensation intact, DTR normal. Strength 5/5 in all 4 limbs.  Psychiatric: Normal judgment and insight. Alert and oriented x 3. Normal mood and affect.     Labs on Admission: I have personally reviewed following labs and imaging studies  CBC:  Recent Labs Lab 04/23/17 1815  WBC 10.9*  NEUTROABS 7.5  HGB 15.3  HCT 44.0  MCV 95.9  PLT 408   Basic Metabolic Panel:  Recent Labs Lab 04/23/17 1815  NA 137  K 4.5  CL 101  CO2 26  GLUCOSE 109*  BUN 23*  CREATININE 1.49*  CALCIUM 9.2   GFR: Estimated Creatinine  Clearance: 48.9 mL/min (A) (by C-G formula based on SCr of 1.49 mg/dL (H)). Liver Function Tests: No results for input(s): AST, ALT, ALKPHOS, BILITOT, PROT, ALBUMIN in the last 168 hours. No results for input(s): LIPASE, AMYLASE in the last 168 hours. No results for input(s): AMMONIA in the last 168 hours. Coagulation Profile: No results for input(s): INR, PROTIME in the last 168 hours. Cardiac Enzymes:  Recent Labs Lab 04/23/17 1815  TROPONINI <0.03   BNP (last 3 results) No results for input(s): PROBNP in the last 8760 hours. HbA1C: No results for input(s): HGBA1C in the last 72 hours. CBG: No results for input(s): GLUCAP in the last 168 hours. Lipid Profile: No results for input(s): CHOL, HDL, LDLCALC, TRIG, CHOLHDL, LDLDIRECT in the last 72 hours. Thyroid Function Tests: No results for input(s): TSH, T4TOTAL, FREET4, T3FREE, THYROIDAB in the last 72 hours. Anemia Panel: No results for input(s): VITAMINB12, FOLATE, FERRITIN, TIBC, IRON, RETICCTPCT in the last 72 hours. Urine analysis:    Component Value Date/Time   COLORURINE YELLOW 04/02/2016 [place] 04/02/2016 1037   LABSPEC 1.006 04/02/2016 1037   PHURINE 6.0 04/02/2016 1037   GLUCOSEU NEGATIVE 04/02/2016 1037   HGBUR NEGATIVE 04/02/2016 1037   [place] 04/02/2016 1037   KETONESUR NEGATIVE 04/02/2016 1037   PROTEINUR NEGATIVE 04/02/2016 1037   NITRITE NEGATIVE 04/02/2016 1037   LEUKOCYTESUR SMALL (A) 04/02/2016 1037   Sepsis Labs: @LABRCNTIP (procalcitonin:4,lacticidven:4) ) Recent Results (from the past 240 hour(s))  Culture, blood (routine x 2)     Status: None (Preliminary result)   Collection Time: 04/23/17  5:55 PM  Result Value Ref Range Status   Specimen Description LEFT ANTECUBITAL  Final   Special Requests   Final    BOTTLES DRAWN AEROBIC AND ANAEROBIC Blood Culture adequate volume   Culture PENDING  Incomplete   Report Status PENDING  Incomplete  Culture, blood (routine  x 2)     Status: None (Preliminary result)   Collection Time: 04/23/17  6:15 PM  Result Value Ref Range Status   Specimen Description RIGHT ANTECUBITAL  Final   Special Requests   Final    BOTTLES DRAWN AEROBIC AND ANAEROBIC Blood Culture adequate volume   Culture PENDING  Incomplete   Report Status PENDING  Incomplete     Radiological Exams on Admission: Dg Chest  2 View  Result Date: 04/23/2017 CLINICAL DATA:  Left lower leg cellulitis. EXAM: CHEST  2 VIEW COMPARISON:  04/09/2016 FINDINGS: Lungs are hyperexpanded. The lungs are clear without focal pneumonia, edema, pneumothorax or pleural effusion. Interstitial markings are diffusely coarsened with chronic features. Cardiopericardial silhouette is at upper limits of normal for size. Bones are diffusely demineralized. Telemetry leads overlie the chest. IMPRESSION: Hyperexpansion with chronic interstitial changes. No acute cardiopulmonary findings. Electronically Signed   By: Misty Stanley M.D.   On: 04/23/2017 20:17   Dg Knee Complete 4 Views Right  Result Date: 04/23/2017 CLINICAL DATA:  Left lower leg cellulitis. EXAM: RIGHT KNEE - COMPLETE 4+ VIEW COMPARISON:  08/25/2014 FINDINGS: No fracture. No subluxation or dislocation. Meniscal calcification is evident. Small joint effusion noted. No worrisome lytic or sclerotic osseous abnormality IMPRESSION: Stable.  No acute findings. Electronically Signed   By: Misty Stanley M.D.   On: 04/23/2017 20:19    EKG: Independently reviewed. Sinus rhythm, incomplete RBBB and LAFB.   Assessment/Plan  1. Cellulitis  - Pt presents with unchanged RLE redness and swelling despite outpatient abx for suspected cellulitis  - Reports fevers at home, afebrile here - Blood cultures obtained in ED and vancomycin given  - Plan to check venous US for DVT, follow cultures and clinical course, continue abx with Rocephin, elevate leg     2. Acute kidney injury  - SCr is 1.49 on admission, up from 1.05 a month  earlier  - Likely prerenal in setting of acute febrile illness and continued Lasix use  - Hold Lasix and losartan, provide gentle IV hydration overnight, repeat chem panel in am    3. COPD  - Stable  - Continue ICS/LABA, Spiriva, and prn albuterol    4. Hypertension  - BP at goal   - Continue Toprol, hydralazine, Norvasc; hold losartan until renal fxn stabilizes   5. Chronic diastolic CHF  - Clinically hypovolemic on admission with AKI  - Follow I/O's and daily wts, provide gentle IVF hydration overnight, hold Lasix and losartan, continue beta-blocker    6. OSA  - Continue CPAP qHS     DVT prophylaxis: sq heparin Code Status: Full  Family Communication: Discussed with patient Disposition Plan: Admit to med-surg Consults called: None Admission status: Inpatient    Vianne Bulls, MD [facility] Pager [phone]  If 7PM-7AM, please contact night-coverage [email or web address] Password [email or web address]  04/23/2017, 9:02 PM

## 2017-04-24 ENCOUNTER — Inpatient Hospital Stay (HOSPITAL_COMMUNITY): Payer: Medicare HMO

## 2017-04-24 DIAGNOSIS — M1120 Other chondrocalcinosis, unspecified site: Secondary | ICD-10-CM

## 2017-04-24 DIAGNOSIS — M25561 Pain in right knee: Secondary | ICD-10-CM

## 2017-04-24 LAB — GRAM STAIN

## 2017-04-24 LAB — CBC WITH DIFFERENTIAL/PLATELET
BASOS ABS: 0 10*3/uL (ref 0.0–0.1)
Basophils Relative: 0 %
Eosinophils Absolute: 0.2 10*3/uL (ref 0.0–0.7)
Eosinophils Relative: 2 %
HEMATOCRIT: 44 % (ref 39.0–52.0)
Hemoglobin: 14.5 g/dL (ref 13.0–17.0)
LYMPHS PCT: 18 %
Lymphs Abs: 1.8 10*3/uL (ref 0.7–4.0)
MCH: 32.3 pg (ref 26.0–34.0)
MCHC: 33 g/dL (ref 30.0–36.0)
MCV: 98 fL (ref 78.0–100.0)
Monocytes Absolute: 1.5 10*3/uL — ABNORMAL HIGH (ref 0.1–1.0)
Monocytes Relative: 16 %
NEUTROS ABS: 6 10*3/uL (ref 1.7–7.7)
Neutrophils Relative %: 64 %
Platelets: 199 10*3/uL (ref 150–400)
RBC: 4.49 MIL/uL (ref 4.22–5.81)
RDW: 14.4 % (ref 11.5–15.5)
WBC: 9.6 10*3/uL (ref 4.0–10.5)

## 2017-04-24 LAB — BASIC METABOLIC PANEL WITH GFR
Anion gap: 9 (ref 5–15)
BUN: 21 mg/dL — ABNORMAL HIGH (ref 6–20)
CO2: 26 mmol/L (ref 22–32)
Calcium: 8.9 mg/dL (ref 8.9–10.3)
Chloride: 101 mmol/L (ref 101–111)
Creatinine, Ser: 1.27 mg/dL — ABNORMAL HIGH (ref 0.61–1.24)
GFR calc Af Amer: >60 mL/min
GFR calc non Af Amer: 53 mL/min — ABNORMAL LOW
Glucose, Bld: 121 mg/dL — ABNORMAL HIGH (ref 65–99)
Potassium: 4.4 mmol/L (ref 3.5–5.1)
Sodium: 136 mmol/L (ref 135–145)

## 2017-04-24 LAB — SYNOVIAL CELL COUNT + DIFF, W/ CRYSTALS
CRYSTALS FLUID: NONE SEEN
Eosinophils-Synovial: 0 % (ref 0–1)
Lymphocytes-Synovial Fld: 1 % (ref 0–20)
Monocyte-Macrophage-Synovial Fluid: 8 % — ABNORMAL LOW (ref 50–90)
Neutrophil, Synovial: 91 % — ABNORMAL HIGH (ref 0–25)
WBC, Synovial: 1885 /mm3 — ABNORMAL HIGH (ref 0–200)

## 2017-04-24 LAB — GLUCOSE, CAPILLARY: GLUCOSE-CAPILLARY: 130 mg/dL — AB (ref 65–99)

## 2017-04-24 LAB — SEDIMENTATION RATE: Sed Rate: 47 mm/hr — ABNORMAL HIGH (ref 0–16)

## 2017-04-24 LAB — C-REACTIVE PROTEIN: CRP: 9.5 mg/dL — ABNORMAL HIGH (ref <1.0)

## 2017-04-24 MED ORDER — LORAZEPAM 0.5 MG PO TABS
0.5000 mg | ORAL_TABLET | Freq: Once | ORAL | Status: AC
  Administered 2017-04-24: 0.5 mg via ORAL
  Filled 2017-04-24: qty 1

## 2017-04-24 MED ORDER — LIDOCAINE HCL (PF) 1 % IJ SOLN
INTRAMUSCULAR | Status: AC
  Administered 2017-04-24: 3 mL
  Filled 2017-04-24: qty 5

## 2017-04-24 NOTE — Care Management Note (Signed)
Case Management Note  Patient Details  Name: Kevin Frank MRN: [id number] Date of Birth: 1940-07-16  Subjective/Objective:    Admitted with cellulitis. Pt from home, lives with wife and is ind with ADL's. He uses CPAP pta but does not have home oxygen or neb machine. He has PCP, transportation and insurance with drug coverage. No needs communicated.                  Action/Plan: DC home with self care. Will need to wean from oxygen prior to DC.   Expected Discharge Date:    04/27/2017              Expected Discharge Plan:  Home/Self Care  In-House Referral:  NA  Discharge planning Services  CM Consult  Post Acute Care Choice:  NA Choice offered to:  NA  Status of Service:  Completed, signed off  Sherald Barge, RN 04/24/2017, 3:22 PM

## 2017-04-24 NOTE — Progress Notes (Signed)
Subjective: He was admitted with cellulitis.  He tells me his major problem is his right knee is swollen.  He has been on antibiotics for cellulitis.  He is also had some back pain.  Objective: Vital signs in last 24 hours: Temp:  [98.5 F (36.9 C)-99.2 F (37.3 C)] 98.5 F (36.9 C) (11/02 0054) Pulse Rate:  [46-69] 51 (11/02 0054) Resp:  [16-24] 18 (11/02 0054) BP: (103-140)/(50-103) 103/50 (11/02 0054) SpO2:  [86 %-98 %] 86 % (11/02 0054) Weight:  [95.3 kg (210 lb)] 95.3 kg (210 lb) (11/01 1723) Weight change:  Last BM Date: 04/23/17  Intake/Output from previous day: 11/01 0701 - 11/02 0700 In: 748.8 [I.V.:498.8; IV Piggyback:250] Out: -   PHYSICAL EXAM General appearance: alert, cooperative and mild distress Resp: rhonchi bilaterally Cardio: regular rate and rhythm, S1, S2 normal, no murmur, click, rub or gallop GI: soft, non-tender; bowel sounds normal; no masses,  no organomegaly Extremities: He still has mild erythema of his left shin but he looks better than it did in my office earlier this week.  His right knee is swollen and I think he has an effusion Very hard of hearing  Lab Results:  Results for orders placed or performed during the hospital encounter of 04/23/17 (from the past 48 hour(s))  Culture, blood (routine x 2)     Status: None (Preliminary result)   Collection Time: 04/23/17  5:55 PM  Result Value Ref Range   Specimen Description LEFT ANTECUBITAL    Special Requests      BOTTLES DRAWN AEROBIC AND ANAEROBIC Blood Culture adequate volume   Culture NO GROWTH < 24 HOURS    Report Status PENDING   Basic metabolic panel     Status: Abnormal   Collection Time: 04/23/17  6:15 PM  Result Value Ref Range   Sodium 137 135 - 145 mmol/L   Potassium 4.5 3.5 - 5.1 mmol/L   Chloride 101 101 - 111 mmol/L   CO2 26 22 - 32 mmol/L   Glucose, Bld 109 (H) 65 - 99 mg/dL   BUN 23 (H) 6 - 20 mg/dL   Creatinine, Ser 1.49 (H) 0.61 - 1.24 mg/dL   Calcium 9.2 8.9 - 10.3  mg/dL   GFR calc non Af Amer 44 (L) >60 mL/min   GFR calc Af Amer 51 (L) >60 mL/min    Comment: (NOTE) The eGFR has been calculated using the CKD EPI equation. This calculation has not been validated in all clinical situations. eGFR's persistently <60 mL/min signify possible Chronic Kidney Disease.    Anion gap 10 5 - 15  Troponin I     Status: None   Collection Time: 04/23/17  6:15 PM  Result Value Ref Range   Troponin I <0.03 <0.03 ng/mL  Lactic acid, plasma     Status: None   Collection Time: 04/23/17  6:15 PM  Result Value Ref Range   Lactic Acid, Venous 1.4 0.5 - 1.9 mmol/L  CBC with Differential     Status: Abnormal   Collection Time: 04/23/17  6:15 PM  Result Value Ref Range   WBC 10.9 (H) 4.0 - 10.5 K/uL   RBC 4.59 4.22 - 5.81 MIL/uL   Hemoglobin 15.3 13.0 - 17.0 g/dL   HCT 44.0 39.0 - 52.0 %   MCV 95.9 78.0 - 100.0 fL   MCH 33.3 26.0 - 34.0 pg   MCHC 34.8 30.0 - 36.0 g/dL   RDW 14.2 11.5 - 15.5 %   Platelets 215 150 -  400 K/uL   Neutrophils Relative % 70 %   Neutro Abs 7.5 1.7 - 7.7 K/uL   Lymphocytes Relative 19 %   Lymphs Abs 2.1 0.7 - 4.0 K/uL   Monocytes Relative 9 %   Monocytes Absolute 1.0 0.1 - 1.0 K/uL   Eosinophils Relative 2 %   Eosinophils Absolute 0.2 0.0 - 0.7 K/uL   Basophils Relative 0 %   Basophils Absolute 0.0 0.0 - 0.1 K/uL  Culture, blood (routine x 2)     Status: None (Preliminary result)   Collection Time: 04/23/17  6:15 PM  Result Value Ref Range   Specimen Description RIGHT ANTECUBITAL    Special Requests      BOTTLES DRAWN AEROBIC AND ANAEROBIC Blood Culture adequate volume   Culture NO GROWTH < 12 HOURS    Report Status PENDING   Lactic acid, plasma     Status: None   Collection Time: 04/23/17  7:11 PM  Result Value Ref Range   Lactic Acid, Venous 1.6 0.5 - 1.9 mmol/L  CBC WITH DIFFERENTIAL     Status: Abnormal   Collection Time: 04/24/17  4:38 AM  Result Value Ref Range   WBC 9.6 4.0 - 10.5 K/uL   RBC 4.49 4.22 - 5.81 MIL/uL    Hemoglobin 14.5 13.0 - 17.0 g/dL   HCT 44.0 39.0 - 52.0 %   MCV 98.0 78.0 - 100.0 fL   MCH 32.3 26.0 - 34.0 pg   MCHC 33.0 30.0 - 36.0 g/dL   RDW 14.4 11.5 - 15.5 %   Platelets 199 150 - 400 K/uL   Neutrophils Relative % 64 %   Neutro Abs 6.0 1.7 - 7.7 K/uL   Lymphocytes Relative 18 %   Lymphs Abs 1.8 0.7 - 4.0 K/uL   Monocytes Relative 16 %   Monocytes Absolute 1.5 (H) 0.1 - 1.0 K/uL   Eosinophils Relative 2 %   Eosinophils Absolute 0.2 0.0 - 0.7 K/uL   Basophils Relative 0 %   Basophils Absolute 0.0 0.0 - 0.1 K/uL  Basic metabolic panel     Status: Abnormal   Collection Time: 04/24/17  4:38 AM  Result Value Ref Range   Sodium 136 135 - 145 mmol/L   Potassium 4.4 3.5 - 5.1 mmol/L   Chloride 101 101 - 111 mmol/L   CO2 26 22 - 32 mmol/L   Glucose, Bld 121 (H) 65 - 99 mg/dL   BUN 21 (H) 6 - 20 mg/dL   Creatinine, Ser 1.27 (H) 0.61 - 1.24 mg/dL   Calcium 8.9 8.9 - 10.3 mg/dL   GFR calc non Af Amer 53 (L) >60 mL/min   GFR calc Af Amer >60 >60 mL/min    Comment: (NOTE) The eGFR has been calculated using the CKD EPI equation. This calculation has not been validated in all clinical situations. eGFR's persistently <60 mL/min signify possible Chronic Kidney Disease.    Anion gap 9 5 - 15  Glucose, capillary     Status: Abnormal   Collection Time: 04/24/17  8:15 AM  Result Value Ref Range   Glucose-Capillary 130 (H) 65 - 99 mg/dL   Comment 1 Notify RN    Comment 2 Document in Chart     ABGS No results for input(s): PHART, PO2ART, TCO2, HCO3 in the last 72 hours.  Invalid input(s): PCO2 CULTURES Recent Results (from the past 240 hour(s))  Culture, blood (routine x 2)     Status: None (Preliminary result)   Collection Time: 04/23/17  5:55 PM  Result Value Ref Range Status   Specimen Description LEFT ANTECUBITAL  Final   Special Requests   Final    BOTTLES DRAWN AEROBIC AND ANAEROBIC Blood Culture adequate volume   Culture NO GROWTH < 24 HOURS  Final   Report Status  PENDING  Incomplete  Culture, blood (routine x 2)     Status: None (Preliminary result)   Collection Time: 04/23/17  6:15 PM  Result Value Ref Range Status   Specimen Description RIGHT ANTECUBITAL  Final   Special Requests   Final    BOTTLES DRAWN AEROBIC AND ANAEROBIC Blood Culture adequate volume   Culture NO GROWTH < 12 HOURS  Final   Report Status PENDING  Incomplete   Studies/Results: Dg Chest 2 View  Result Date: 04/23/2017 CLINICAL DATA:  Left lower leg cellulitis. EXAM: CHEST  2 VIEW COMPARISON:  04/09/2016 FINDINGS: Lungs are hyperexpanded. The lungs are clear without focal pneumonia, edema, pneumothorax or pleural effusion. Interstitial markings are diffusely coarsened with chronic features. Cardiopericardial silhouette is at upper limits of normal for size. Bones are diffusely demineralized. Telemetry leads overlie the chest. IMPRESSION: Hyperexpansion with chronic interstitial changes. No acute cardiopulmonary findings. Electronically Signed   By: Misty Stanley M.D.   On: 04/23/2017 20:17   Dg Knee Complete 4 Views Right  Result Date: 04/23/2017 CLINICAL DATA:  Left lower leg cellulitis. EXAM: RIGHT KNEE - COMPLETE 4+ VIEW COMPARISON:  08/25/2014 FINDINGS: No fracture. No subluxation or dislocation. Meniscal calcification is evident. Small joint effusion noted. No worrisome lytic or sclerotic osseous abnormality IMPRESSION: Stable.  No acute findings. Electronically Signed   By: Misty Stanley M.D.   On: 04/23/2017 20:19    Medications:  Prior to Admission:  Facility-Administered Medications Prior to Admission  Medication Dose Route Frequency Provider Last Rate Last Dose  . ipratropium (ATROVENT) nebulizer solution 0.5 mg  0.5 mg Nebulization Once Gean Quint, MD      . levalbuterol Penne Lash) nebulizer solution 1.25 mg  1.25 mg Nebulization Once Gean Quint, MD       Prescriptions Prior to Admission  Medication Sig Dispense Refill Last Dose  . albuterol (PROVENTIL  HFA;VENTOLIN HFA) 108 (90 BASE) MCG/ACT inhaler Inhale 2 puffs into the lungs every 6 (six) hours as needed (rescue inhaler).    Taking  . allopurinol (ZYLOPRIM) 300 MG tablet Take 300 mg by mouth daily.     04/23/2017 at Unknown time  . amLODipine (NORVASC) 10 MG tablet Take 1 tablet (10 mg total) by mouth daily. 90 tablet 3 04/23/2017 at Unknown time  . atorvastatin (LIPITOR) 20 MG tablet Take 20 mg by mouth daily.    04/23/2017 at Unknown time  . azelastine (ASTELIN) 0.1 % nasal spray Place 2 sprays into both nostrils 2 (two) times daily.   04/22/2017 at Unknown time  . baclofen (LIORESAL) 10 MG tablet Take 1 tablet by mouth 2 (two) times daily.   04/23/2017 at Unknown time  . budesonide-formoterol (SYMBICORT) 160-4.5 MCG/ACT inhaler Inhale 2 puffs into the lungs 2 (two) times daily. 3 Inhaler 1 04/23/2017 at Unknown time  . clindamycin (CLEOCIN) 300 MG capsule Take 1 capsule (300 mg total) by mouth every 6 (six) hours. 30 capsule 0 04/23/2017 at Unknown time  . clopidogrel (PLAVIX) 75 MG tablet TAKE (1) TABLET BY MOUTH ONCE DAILY. 30 tablet 0 04/23/2017 at 800  . doxycycline (VIBRA-TABS) 100 MG tablet Take 1 tablet by mouth 2 (two) times daily.   04/23/2017 at Unknown time  .  furosemide (LASIX) 40 MG tablet Take 40 mg by mouth daily.     04/23/2017 at Unknown time  . hydrALAZINE (APRESOLINE) 25 MG tablet Take 1 tablet (25 mg total) by mouth 2 (two) times daily. 60 tablet 6 04/23/2017 at Unknown time  . losartan (COZAAR) 100 MG tablet Take 1 tablet (100 mg total) by mouth daily. 90 tablet 3 04/23/2017 at Unknown time  . metFORMIN (GLUCOPHAGE) 500 MG tablet Take 500 mg by mouth 2 (two) times daily with a meal.    04/23/2017 at Unknown time  . metoprolol succinate (TOPROL-XL) 25 MG 24 hr tablet Take 1 tablet (25 mg total) by mouth daily. 90 tablet 3 04/23/2017 at 800  . sertraline (ZOLOFT) 50 MG tablet Take 50 mg by mouth daily.     04/23/2017 at Unknown time  . SPIRIVA HANDIHALER 18 MCG inhalation capsule Place  18 mcg into inhaler and inhale daily.    04/23/2017 at Unknown time   Scheduled: . allopurinol  300 mg Oral Daily  . amLODipine  10 mg Oral Daily  . atorvastatin  20 mg Oral q1800  . azelastine  2 spray Each Nare BID  . baclofen  10 mg Oral BID  . clopidogrel  75 mg Oral Daily  . heparin  5,000 Units Subcutaneous Q8H  . hydrALAZINE  25 mg Oral BID  . metoprolol succinate  25 mg Oral Daily  . mometasone-formoterol  2 puff Inhalation BID  . nicotine  21 mg Transdermal Daily  . sertraline  50 mg Oral Daily  . tiotropium  18 mcg Inhalation Daily   Continuous: . cefTRIAXone (ROCEPHIN)  IV Stopped (04/24/17 0001)   XGK:[id number] **OR** acetaminophen, albuterol, bisacodyl, HYDROcodone-acetaminophen, morphine injection, ondansetron **OR** ondansetron (ZOFRAN) IV, senna-docusate  Assesment: He has cellulitis.  He has acute kidney injury.  He is being treated with IV fluids.  I have requested orthopedic consultation.  He has knee swelling with possible effusion.  He has COPD which is stable.  He has chronic diastolic heart failure so we cannot give him too much fluid.  He has had recent aortic aneurysm repair. Principal Problem:   Cellulitis Active Problems:   Hypertension, essential   Stage 2 moderate COPD by GOLD classification (HCC)   Obstructive sleep apnea on CPAP   Chronic knee pain   AKI (acute kidney injury) ([place])   Chronic diastolic CHF (congestive heart failure) ([place])    Plan: Continue and IV antibiotics.  Request orthopedic consultation    LOS: 1 day   Kevin Frank L 04/24/2017, 8:49 AM

## 2017-04-24 NOTE — Progress Notes (Signed)
Pt placed on [facility] CPAP. CPAP plugged into red outlet. Pt tolerating well

## 2017-04-24 NOTE — Plan of Care (Signed)
Problem: Education: Goal: Knowledge of [facility] Education information/materials will improve Outcome: Progressing Discussion to be performed with initial assessment regarding general educational, information and medical regime.

## 2017-04-24 NOTE — Care Management Important Message (Signed)
Important Message  Patient Details  Name: Kevin Frank MRN: [id number] Date of Birth: April 07, 1941   Medicare Important Message Given:  Yes    Sherald Barge, RN 04/24/2017, 3:26 PM

## 2017-04-24 NOTE — Consult Note (Signed)
Reason for Consult:right knee pain  Referring Physician: Dr. Ferd Glassing is an 76 y.o. male.  HPI: 76 year old male being treated for cellulitis and pain in his left knee presents  with swelling and pain right knee for 3 days with inability to walk. The pain became so severe that he had trouble bending and straightening his knee. The pain was sharp throbbing and aching. Constant.  He says he was treated with antibiotics for the left knee and then the right knee started hurting    Past Medical History:  Diagnosis Date  . AAA (abdominal aortic aneurysm) ([place]) 01/2008   3.2cm  . BPH (benign prostatic hyperplasia)   . Chronic knee pain    bilateral  . COPD (chronic obstructive pulmonary disease) ([place])   . Diabetes mellitus    type II  . Gout   . History of kidney stones   . Hypercholesterolemia   . Hyperlipidemia   . Hypertension   . Normal nuclear stress test 2011  . Obesity   . Obstructive sleep apnea on CPAP   . Presence of indwelling urinary catheter   . Shortness of breath    due to cigarette abuse  . Tobacco abuse     Past Surgical History:  Procedure Laterality Date  . ABDOMINAL AORTIC ENDOVASCULAR FENESTRATED STENT GRAFT N/A 04/09/2016   Procedure: ABDOMINAL AORTIC ENDOVASCULAR FENESTRATED STENT GRAFT with left brachial artery access using ultrasound guidance;  Surgeon: Serafina Mitchell, MD;  Location: [facility];  Service: Vascular;  Laterality: N/A;  . [place]   [place]  . PERIPHERAL VASCULAR CATHETERIZATION N/A 04/10/2016   Procedure: Renal Angiography;  Surgeon: Angelia Mould, MD;  Location: [facility];  Service: Cardiovascular;  Laterality: N/A;  . TRANSURETHRAL RESECTION OF PROSTATE  01/07/2011   Procedure: TRANSURETHRAL RESECTION OF THE PROSTATE (TURP);  Surgeon: Marissa Nestle;  Location: [facility];  Service: Urology;  Laterality: N/A;    Family History  Problem Relation Age of Onset  . Liver disease Father   . Lymphoma  Mother   . Diabetes Mother   . Hypertension Mother   . Cancer Mother   . Mental retardation Unknown        sibling -- from birth     Social History:  reports that he has been smoking Cigarettes.  He has a 100.00 pack-year smoking history. He has never used smokeless tobacco. He reports that he drinks alcohol. He reports that he does not use drugs.  Allergies:  Allergies  Allergen Reactions  . No Known Allergies     Medications: I have reviewed the patient's current medications.  Results for orders placed or performed during the hospital encounter of 04/23/17 (from the past 48 hour(s))  Culture, blood (routine x 2)     Status: None (Preliminary result)   Collection Time: 04/23/17  5:55 PM  Result Value Ref Range   Specimen Description LEFT ANTECUBITAL    Special Requests      BOTTLES DRAWN AEROBIC AND ANAEROBIC Blood Culture adequate volume   Culture NO GROWTH < 24 HOURS    Report Status PENDING   Basic metabolic panel     Status: Abnormal   Collection Time: 04/23/17  6:15 PM  Result Value Ref Range   Sodium 137 135 - 145 mmol/L   Potassium 4.5 3.5 - 5.1 mmol/L   Chloride 101 101 - 111 mmol/L   CO2 26 22 - 32 mmol/L   Glucose, Bld 109 (H) 65 -  99 mg/dL   BUN 23 (H) 6 - 20 mg/dL   Creatinine, Ser 1.49 (H) 0.61 - 1.24 mg/dL   Calcium 9.2 8.9 - 10.3 mg/dL   GFR calc non Af Amer 44 (L) >60 mL/min   GFR calc Af Amer 51 (L) >60 mL/min    Comment: (NOTE) The eGFR has been calculated using the CKD EPI equation. This calculation has not been validated in all clinical situations. eGFR's persistently <60 mL/min signify possible Chronic Kidney Disease.    Anion gap 10 5 - 15  Troponin I     Status: None   Collection Time: 04/23/17  6:15 PM  Result Value Ref Range   Troponin I <0.03 <0.03 ng/mL  Lactic acid, plasma     Status: None   Collection Time: 04/23/17  6:15 PM  Result Value Ref Range   Lactic Acid, Venous 1.4 0.5 - 1.9 mmol/L  CBC with Differential     Status:  Abnormal   Collection Time: 04/23/17  6:15 PM  Result Value Ref Range   WBC 10.9 (H) 4.0 - 10.5 K/uL   RBC 4.59 4.22 - 5.81 MIL/uL   Hemoglobin 15.3 13.0 - 17.0 g/dL   HCT 44.0 39.0 - 52.0 %   MCV 95.9 78.0 - 100.0 fL   MCH 33.3 26.0 - 34.0 pg   MCHC 34.8 30.0 - 36.0 g/dL   RDW 14.2 11.5 - 15.5 %   Platelets 215 150 - 400 K/uL   Neutrophils Relative % 70 %   Neutro Abs 7.5 1.7 - 7.7 K/uL   Lymphocytes Relative 19 %   Lymphs Abs 2.1 0.7 - 4.0 K/uL   Monocytes Relative 9 %   Monocytes Absolute 1.0 0.1 - 1.0 K/uL   Eosinophils Relative 2 %   Eosinophils Absolute 0.2 0.0 - 0.7 K/uL   Basophils Relative 0 %   Basophils Absolute 0.0 0.0 - 0.1 K/uL  Culture, blood (routine x 2)     Status: None (Preliminary result)   Collection Time: 04/23/17  6:15 PM  Result Value Ref Range   Specimen Description RIGHT ANTECUBITAL    Special Requests      BOTTLES DRAWN AEROBIC AND ANAEROBIC Blood Culture adequate volume   Culture NO GROWTH < 12 HOURS    Report Status PENDING   Lactic acid, plasma     Status: None   Collection Time: 04/23/17  7:11 PM  Result Value Ref Range   Lactic Acid, Venous 1.6 0.5 - 1.9 mmol/L  CBC WITH DIFFERENTIAL     Status: Abnormal   Collection Time: 04/24/17  4:38 AM  Result Value Ref Range   WBC 9.6 4.0 - 10.5 K/uL   RBC 4.49 4.22 - 5.81 MIL/uL   Hemoglobin 14.5 13.0 - 17.0 g/dL   HCT 44.0 39.0 - 52.0 %   MCV 98.0 78.0 - 100.0 fL   MCH 32.3 26.0 - 34.0 pg   MCHC 33.0 30.0 - 36.0 g/dL   RDW 14.4 11.5 - 15.5 %   Platelets 199 150 - 400 K/uL   Neutrophils Relative % 64 %   Neutro Abs 6.0 1.7 - 7.7 K/uL   Lymphocytes Relative 18 %   Lymphs Abs 1.8 0.7 - 4.0 K/uL   Monocytes Relative 16 %   Monocytes Absolute 1.5 (H) 0.1 - 1.0 K/uL   Eosinophils Relative 2 %   Eosinophils Absolute 0.2 0.0 - 0.7 K/uL   Basophils Relative 0 %   Basophils Absolute 0.0 0.0 - 0.1 K/uL  Basic metabolic panel     Status: Abnormal   Collection Time: 04/24/17  4:38 AM  Result Value  Ref Range   Sodium 136 135 - 145 mmol/L   Potassium 4.4 3.5 - 5.1 mmol/L   Chloride 101 101 - 111 mmol/L   CO2 26 22 - 32 mmol/L   Glucose, Bld 121 (H) 65 - 99 mg/dL   BUN 21 (H) 6 - 20 mg/dL   Creatinine, Ser 1.27 (H) 0.61 - 1.24 mg/dL   Calcium 8.9 8.9 - 10.3 mg/dL   GFR calc non Af Amer 53 (L) >60 mL/min   GFR calc Af Amer >60 >60 mL/min    Comment: (NOTE) The eGFR has been calculated using the CKD EPI equation. This calculation has not been validated in all clinical situations. eGFR's persistently <60 mL/min signify possible Chronic Kidney Disease.    Anion gap 9 5 - 15  Glucose, capillary     Status: Abnormal   Collection Time: 04/24/17  8:15 AM  Result Value Ref Range   Glucose-Capillary 130 (H) 65 - 99 mg/dL   Comment 1 Notify RN    Comment 2 Document in Chart     Dg Chest 2 View  Result Date: 04/23/2017 CLINICAL DATA:  Left lower leg cellulitis. EXAM: CHEST  2 VIEW COMPARISON:  04/09/2016 FINDINGS: Lungs are hyperexpanded. The lungs are clear without focal pneumonia, edema, pneumothorax or pleural effusion. Interstitial markings are diffusely coarsened with chronic features. Cardiopericardial silhouette is at upper limits of normal for size. Bones are diffusely demineralized. Telemetry leads overlie the chest. IMPRESSION: Hyperexpansion with chronic interstitial changes. No acute cardiopulmonary findings. Electronically Signed   By: Misty Chukwuma Straus M.D.   On: 04/23/2017 20:17   US Venous Img Lower Unilateral Left  Result Date: 04/24/2017 CLINICAL DATA:  Left lower extremity swelling and cellulitis for 3 weeks. EXAM: LEFT LOWER EXTREMITY VENOUS DUPLEX ULTRASOUND TECHNIQUE: Doppler venous assessment of the left lower extremity deep venous system was performed, including characterization of spectral flow, compressibility, and phasicity. COMPARISON:  None. FINDINGS: There is complete compressibility of the left common femoral, femoral, and popliteal veins. Doppler analysis  demonstrates respiratory phasicity and augmentation of flow with calf compression. No obvious superficial vein or calf vein thrombosis. 3.6 x 1.3 x 3.5 cm Baker's cyst is present in the left popliteal fossa. IMPRESSION: No evidence of DVT. Baker's cyst. Electronically Signed   By: Marybelle Killings M.D.   On: 04/24/2017 10:08   Dg Knee Complete 4 Views Right  Result Date: 04/23/2017 CLINICAL DATA:  Left lower leg cellulitis. EXAM: RIGHT KNEE - COMPLETE 4+ VIEW COMPARISON:  08/25/2014 FINDINGS: No fracture. No subluxation or dislocation. Meniscal calcification is evident. Small joint effusion noted. No worrisome lytic or sclerotic osseous abnormality IMPRESSION: Stable.  No acute findings. Electronically Signed   By: Misty Marquerite Forsman M.D.   On: 04/23/2017 20:19   Dg Fluoro Guided Needle Plc Aspiration/injection Loc  Result Date: 04/24/2017 CLINICAL DATA:  RIGHT KNEE PAIN AND SWELLING. EXAM: RIGHT KNEE ASPIRATION. PROCEDURE: Overlying skin prepped with Betadine, draped in the usual sterile fashion, and infiltrated locally with buffered Lidocaine. 20 gauge needle inserted into the right knee joint using a medial approach. 30 cc of straw-colored fluid aspirated without difficulty. Fluid sent for laboratory testing. The patient tolerated the procedure well. No immediate complications. IMPRESSION: Technically successful right knee joint aspiration. Electronically Signed   By: Lorriane Shire M.D.   On: 04/24/2017 11:56    Review of Systems  Constitutional: Negative  for chills and fever.  Musculoskeletal: Positive for back pain.  Skin: Negative.   Neurological: Negative for tingling and sensory change.   Blood pressure (!) 103/50, pulse (!) 51, temperature 98.5 F (36.9 C), temperature source Oral, resp. rate 18, height '5\' 10"'  (1.778 m), weight 210 lb (95.3 kg), SpO2 (!) 86 %. Physical Exam  Constitutional: He is oriented to person, place, and time. He appears well-developed and well-nourished. No distress.    HENT:  Head: Normocephalic and atraumatic.  Cardiovascular:  Both lower extremities have intact pulses with no peripheral edema  Musculoskeletal:       Legs: Neurological: He is alert and oriented to person, place, and time. He has normal strength. No sensory deficit.  Motor strength normal in both lower extremities and sensation normal in both lower extremities  Skin: No rash noted. He is not diaphoretic. No erythema.  Psychiatric: He has a normal mood and affect. His behavior is normal. Judgment and thought content normal.       Assessment/Plan: His x-ray shows chondrocalcinosis so I suspect this may have something to do with his acute onset of pain. His knee does not look infected  Some fluid was aspirated we await those results.  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

## 2017-04-25 LAB — GLUCOSE, CAPILLARY: Glucose-Capillary: 142 mg/dL — ABNORMAL HIGH (ref 65–99)

## 2017-04-25 LAB — PROTEIN, BODY FLUID (OTHER): TOTAL PROTEIN, BODY FLUID OTHER: 2.9 g/dL

## 2017-04-25 MED ORDER — LIDOCAINE HCL 1 % IJ SOLN
10.0000 mL | Freq: Once | INTRAMUSCULAR | Status: DC
  Filled 2017-04-25: qty 10

## 2017-04-25 MED ORDER — PENTAFLUOROPROP-TETRAFLUOROETH EX AERO
INHALATION_SPRAY | Freq: Once | CUTANEOUS | Status: AC
  Administered 2017-04-25: 09:00:00 via TOPICAL
  Filled 2017-04-25: qty 103.5

## 2017-04-25 MED ORDER — METHYLPREDNISOLONE ACETATE 40 MG/ML IJ SUSP
40.0000 mg | Freq: Once | INTRAMUSCULAR | Status: AC
  Administered 2017-04-25: 40 mg via INTRA_ARTICULAR
  Filled 2017-04-25: qty 1

## 2017-04-25 MED ORDER — LIDOCAINE HCL (PF) 1 % IJ SOLN
5.0000 mL | Freq: Once | INTRAMUSCULAR | Status: AC
  Administered 2017-04-25: 5 mL
  Filled 2017-04-25: qty 5

## 2017-04-25 NOTE — Progress Notes (Signed)
Pt placed on [facility] CPAP. CPAP plugged into red outlet. Pt tolerating well.

## 2017-04-25 NOTE — Progress Notes (Signed)
Patient ID: Kevin Frank, male   DOB: 13-Mar-1941, 76 y.o.   MRN: [id number]   RIGHT KNEE PAIN WITH EFFUSION  CX NEG X 24 HRS AND NO ORGS IN FLUID FROM ASPIRATE 04/24/17  XRAYS CHONDROCALCINOSIS    Procedure note aspiration and injection right knee joint  Verbal consent was obtained to aspirate and inject the right knee joint   Timeout was completed to confirm the site of aspiration and injection  An 18-gauge needle was used to aspirate the knee joint from a suprapatellar lateral approach.  The medications used were 40 mg of Depo-Medrol and 1% lidocaine 3 cc  Anesthesia was provided by ethyl chloride and the skin was prepped with alcohol.  After cleaning the skin with alcohol an 18-gauge needle was used to aspirate the right knee joint.  We obtained 60  cc of fluid, cloudy synovial fluid without infectious appearance  We follow this by injection of 40 mg of Depo-Medrol and 3 cc 1% lidocaine.  There were no complications. A sterile bandage was applied.

## 2017-04-25 NOTE — Progress Notes (Signed)
Subjective: He says he feels better. He had aspiration of his knee earlier this morning. He says his knee feels better.  Objective: Vital signs in last 24 hours: Temp:  [98.2 F (36.8 C)-99.3 F (37.4 C)] 98.2 F (36.8 C) (11/03 0513) Pulse Rate:  [61-87] 87 (11/03 0513) Resp:  [18-20] 18 (11/03 0513) BP: (136-138)/(57-63) 136/62 (11/03 0513) SpO2:  [91 %-97 %] 97 % (11/03 0941) Weight:  [99.8 kg (220 lb 1.6 oz)] 99.8 kg (220 lb 1.6 oz) (11/03 0500) Weight change: 4.581 kg (10 lb 1.6 oz) Last BM Date: 04/23/17  Intake/Output from previous day: 11/02 0701 - 11/03 0700 In: 960 [P.O.:960] Out: 900 [Urine:900]  PHYSICAL EXAM General appearance: alert, cooperative and no distress Resp: rhonchi bilaterally Cardio: regular rate and rhythm, S1, S2 normal, no murmur, click, rub or gallop GI: soft, non-tender; bowel sounds normal; no masses,  no organomegaly Extremities: His knee is less swollen and less tender Very hard of hearing  Lab Results:  Results for orders placed or performed during the hospital encounter of 04/23/17 (from the past 48 hour(s))  Culture, blood (routine x 2)     Status: None (Preliminary result)   Collection Time: 04/23/17  5:55 PM  Result Value Ref Range   Specimen Description LEFT ANTECUBITAL    Special Requests      BOTTLES DRAWN AEROBIC AND ANAEROBIC Blood Culture adequate volume   Culture NO GROWTH 2 DAYS    Report Status PENDING   Basic metabolic panel     Status: Abnormal   Collection Time: 04/23/17  6:15 PM  Result Value Ref Range   Sodium 137 135 - 145 mmol/L   Potassium 4.5 3.5 - 5.1 mmol/L   Chloride 101 101 - 111 mmol/L   CO2 26 22 - 32 mmol/L   Glucose, Bld 109 (H) 65 - 99 mg/dL   BUN 23 (H) 6 - 20 mg/dL   Creatinine, Ser 1.49 (H) 0.61 - 1.24 mg/dL   Calcium 9.2 8.9 - 10.3 mg/dL   GFR calc non Af Amer 44 (L) >60 mL/min   GFR calc Af Amer 51 (L) >60 mL/min    Comment: (NOTE) The eGFR has been calculated using the CKD EPI equation. This  calculation has not been validated in all clinical situations. eGFR's persistently <60 mL/min signify possible Chronic Kidney Disease.    Anion gap 10 5 - 15  Troponin I     Status: None   Collection Time: 04/23/17  6:15 PM  Result Value Ref Range   Troponin I <0.03 <0.03 ng/mL  Lactic acid, plasma     Status: None   Collection Time: 04/23/17  6:15 PM  Result Value Ref Range   Lactic Acid, Venous 1.4 0.5 - 1.9 mmol/L  CBC with Differential     Status: Abnormal   Collection Time: 04/23/17  6:15 PM  Result Value Ref Range   WBC 10.9 (H) 4.0 - 10.5 K/uL   RBC 4.59 4.22 - 5.81 MIL/uL   Hemoglobin 15.3 13.0 - 17.0 g/dL   HCT 44.0 39.0 - 52.0 %   MCV 95.9 78.0 - 100.0 fL   MCH 33.3 26.0 - 34.0 pg   MCHC 34.8 30.0 - 36.0 g/dL   RDW 14.2 11.5 - 15.5 %   Platelets 215 150 - 400 K/uL   Neutrophils Relative % 70 %   Neutro Abs 7.5 1.7 - 7.7 K/uL   Lymphocytes Relative 19 %   Lymphs Abs 2.1 0.7 - 4.0 K/uL  Monocytes Relative 9 %   Monocytes Absolute 1.0 0.1 - 1.0 K/uL   Eosinophils Relative 2 %   Eosinophils Absolute 0.2 0.0 - 0.7 K/uL   Basophils Relative 0 %   Basophils Absolute 0.0 0.0 - 0.1 K/uL  Culture, blood (routine x 2)     Status: None (Preliminary result)   Collection Time: 04/23/17  6:15 PM  Result Value Ref Range   Specimen Description RIGHT ANTECUBITAL    Special Requests      BOTTLES DRAWN AEROBIC AND ANAEROBIC Blood Culture adequate volume   Culture NO GROWTH 2 DAYS    Report Status PENDING   Lactic acid, plasma     Status: None   Collection Time: 04/23/17  7:11 PM  Result Value Ref Range   Lactic Acid, Venous 1.6 0.5 - 1.9 mmol/L  CBC WITH DIFFERENTIAL     Status: Abnormal   Collection Time: 04/24/17  4:38 AM  Result Value Ref Range   WBC 9.6 4.0 - 10.5 K/uL   RBC 4.49 4.22 - 5.81 MIL/uL   Hemoglobin 14.5 13.0 - 17.0 g/dL   HCT 44.0 39.0 - 52.0 %   MCV 98.0 78.0 - 100.0 fL   MCH 32.3 26.0 - 34.0 pg   MCHC 33.0 30.0 - 36.0 g/dL   RDW 14.4 11.5 - 15.5 %    Platelets 199 150 - 400 K/uL   Neutrophils Relative % 64 %   Neutro Abs 6.0 1.7 - 7.7 K/uL   Lymphocytes Relative 18 %   Lymphs Abs 1.8 0.7 - 4.0 K/uL   Monocytes Relative 16 %   Monocytes Absolute 1.5 (H) 0.1 - 1.0 K/uL   Eosinophils Relative 2 %   Eosinophils Absolute 0.2 0.0 - 0.7 K/uL   Basophils Relative 0 %   Basophils Absolute 0.0 0.0 - 0.1 K/uL  Basic metabolic panel     Status: Abnormal   Collection Time: 04/24/17  4:38 AM  Result Value Ref Range   Sodium 136 135 - 145 mmol/L   Potassium 4.4 3.5 - 5.1 mmol/L   Chloride 101 101 - 111 mmol/L   CO2 26 22 - 32 mmol/L   Glucose, Bld 121 (H) 65 - 99 mg/dL   BUN 21 (H) 6 - 20 mg/dL   Creatinine, Ser 1.27 (H) 0.61 - 1.24 mg/dL   Calcium 8.9 8.9 - 10.3 mg/dL   GFR calc non Af Amer 53 (L) >60 mL/min   GFR calc Af Amer >60 >60 mL/min    Comment: (NOTE) The eGFR has been calculated using the CKD EPI equation. This calculation has not been validated in all clinical situations. eGFR's persistently <60 mL/min signify possible Chronic Kidney Disease.    Anion gap 9 5 - 15  Glucose, capillary     Status: Abnormal   Collection Time: 04/24/17  8:15 AM  Result Value Ref Range   Glucose-Capillary 130 (H) 65 - 99 mg/dL   Comment 1 Notify RN    Comment 2 Document in Chart   Synovial cell count + diff, w/ crystals     Status: Abnormal   Collection Time: 04/24/17 11:29 AM  Result Value Ref Range   Color, Synovial YELLOW (A) YELLOW   Appearance-Synovial CLOUDY (A) CLEAR   Crystals, Fluid NO CRYSTALS SEEN    WBC, Synovial 1,885 (H) 0 - 200 /cu mm   Neutrophil, Synovial 91 (H) 0 - 25 %   Lymphocytes-Synovial Fld 1 0 - 20 %   Monocyte-Macrophage-Synovial Fluid 8 (L) 50 -  90 %   Eosinophils-Synovial 0 0 - 1 %  Culture, body fluid-bottle     Status: None (Preliminary result)   Collection Time: 04/24/17 11:29 AM  Result Value Ref Range   Specimen Description SYNOVIAL    Special Requests BOTTLES DRAWN AEROBIC AND ANAEROBIC 10CC     Culture NO GROWTH < 24 HOURS    Report Status PENDING   Gram stain     Status: None   Collection Time: 04/24/17 11:29 AM  Result Value Ref Range   Specimen Description SYNOVIAL    Special Requests NONE    Gram Stain      WBC PRESENT,BOTH PMN AND MONONUCLEAR NO ORGANISMS SEEN CYTOSPIN SMEAR    Report Status 04/24/2017 FINAL   Sedimentation rate     Status: Abnormal   Collection Time: 04/24/17  1:17 PM  Result Value Ref Range   Sed Rate 47 (H) 0 - 16 mm/hr  C-reactive protein     Status: Abnormal   Collection Time: 04/24/17  1:17 PM  Result Value Ref Range   CRP 9.5 (H) <1.0 mg/dL    Comment: Performed at [facility], [address], [place], [place] [address]  Glucose, capillary     Status: Abnormal   Collection Time: 04/25/17  7:45 AM  Result Value Ref Range   Glucose-Capillary 142 (H) 65 - 99 mg/dL   Comment 1 Notify RN    Comment 2 Document in Chart     ABGS No results for input(s): PHART, PO2ART, TCO2, HCO3 in the last 72 hours.  Invalid input(s): PCO2 CULTURES Recent Results (from the past 240 hour(s))  Culture, blood (routine x 2)     Status: None (Preliminary result)   Collection Time: 04/23/17  5:55 PM  Result Value Ref Range Status   Specimen Description LEFT ANTECUBITAL  Final   Special Requests   Final    BOTTLES DRAWN AEROBIC AND ANAEROBIC Blood Culture adequate volume   Culture NO GROWTH 2 DAYS  Final   Report Status PENDING  Incomplete  Culture, blood (routine x 2)     Status: None (Preliminary result)   Collection Time: 04/23/17  6:15 PM  Result Value Ref Range Status   Specimen Description RIGHT ANTECUBITAL  Final   Special Requests   Final    BOTTLES DRAWN AEROBIC AND ANAEROBIC Blood Culture adequate volume   Culture NO GROWTH 2 DAYS  Final   Report Status PENDING  Incomplete  Culture, body fluid-bottle     Status: None (Preliminary result)   Collection Time: 04/24/17 11:29 AM  Result Value Ref Range Status   Specimen Description SYNOVIAL   Final   Special Requests BOTTLES DRAWN AEROBIC AND ANAEROBIC 10CC  Final   Culture NO GROWTH < 24 HOURS  Final   Report Status PENDING  Incomplete  Gram stain     Status: None   Collection Time: 04/24/17 11:29 AM  Result Value Ref Range Status   Specimen Description SYNOVIAL  Final   Special Requests NONE  Final   Gram Stain   Final    WBC PRESENT,BOTH PMN AND MONONUCLEAR NO ORGANISMS SEEN CYTOSPIN SMEAR    Report Status 04/24/2017 FINAL  Final   Studies/Results: Dg Chest 2 View  Result Date: 04/23/2017 CLINICAL DATA:  Left lower leg cellulitis. EXAM: CHEST  2 VIEW COMPARISON:  04/09/2016 FINDINGS: Lungs are hyperexpanded. The lungs are clear without focal pneumonia, edema, pneumothorax or pleural effusion. Interstitial markings are diffusely coarsened with chronic features. Cardiopericardial  silhouette is at upper limits of normal for size. Bones are diffusely demineralized. Telemetry leads overlie the chest. IMPRESSION: Hyperexpansion with chronic interstitial changes. No acute cardiopulmonary findings. Electronically Signed   By: Misty Stanley M.D.   On: 04/23/2017 20:17   US Venous Img Lower Unilateral Left  Result Date: 04/24/2017 CLINICAL DATA:  Left lower extremity swelling and cellulitis for 3 weeks. EXAM: LEFT LOWER EXTREMITY VENOUS DUPLEX ULTRASOUND TECHNIQUE: Doppler venous assessment of the left lower extremity deep venous system was performed, including characterization of spectral flow, compressibility, and phasicity. COMPARISON:  None. FINDINGS: There is complete compressibility of the left common femoral, femoral, and popliteal veins. Doppler analysis demonstrates respiratory phasicity and augmentation of flow with calf compression. No obvious superficial vein or calf vein thrombosis. 3.6 x 1.3 x 3.5 cm Baker's cyst is present in the left popliteal fossa. IMPRESSION: No evidence of DVT. Baker's cyst. Electronically Signed   By: Marybelle Killings M.D.   On: 04/24/2017 10:08   Dg  Knee Complete 4 Views Right  Result Date: 04/23/2017 CLINICAL DATA:  Left lower leg cellulitis. EXAM: RIGHT KNEE - COMPLETE 4+ VIEW COMPARISON:  08/25/2014 FINDINGS: No fracture. No subluxation or dislocation. Meniscal calcification is evident. Small joint effusion noted. No worrisome lytic or sclerotic osseous abnormality IMPRESSION: Stable.  No acute findings. Electronically Signed   By: Misty Stanley M.D.   On: 04/23/2017 20:19   Dg Fluoro Guided Needle Plc Aspiration/injection Loc  Result Date: 04/24/2017 CLINICAL DATA:  RIGHT KNEE PAIN AND SWELLING. EXAM: RIGHT KNEE ASPIRATION. PROCEDURE: Overlying skin prepped with Betadine, draped in the usual sterile fashion, and infiltrated locally with buffered Lidocaine. 20 gauge needle inserted into the right knee joint using a medial approach. 30 cc of straw-colored fluid aspirated without difficulty. Fluid sent for laboratory testing. The patient tolerated the procedure well. No immediate complications. IMPRESSION: Technically successful right knee joint aspiration. Electronically Signed   By: Lorriane Shire M.D.   On: 04/24/2017 11:56    Medications:  Prior to Admission:  Facility-Administered Medications Prior to Admission  Medication Dose Route Frequency Provider Last Rate Last Dose  . ipratropium (ATROVENT) nebulizer solution 0.5 mg  0.5 mg Nebulization Once Gean Quint, MD      . levalbuterol Penne Lash) nebulizer solution 1.25 mg  1.25 mg Nebulization Once Gean Quint, MD       Prescriptions Prior to Admission  Medication Sig Dispense Refill Last Dose  . albuterol (PROVENTIL HFA;VENTOLIN HFA) 108 (90 BASE) MCG/ACT inhaler Inhale 2 puffs into the lungs every 6 (six) hours as needed (rescue inhaler).    Taking  . allopurinol (ZYLOPRIM) 300 MG tablet Take 300 mg by mouth daily.     04/23/2017 at Unknown time  . amLODipine (NORVASC) 10 MG tablet Take 1 tablet (10 mg total) by mouth daily. 90 tablet 3 04/23/2017 at Unknown time  .  atorvastatin (LIPITOR) 20 MG tablet Take 20 mg by mouth daily.    04/23/2017 at Unknown time  . azelastine (ASTELIN) 0.1 % nasal spray Place 2 sprays into both nostrils 2 (two) times daily.   04/22/2017 at Unknown time  . baclofen (LIORESAL) 10 MG tablet Take 1 tablet by mouth 2 (two) times daily.   04/23/2017 at Unknown time  . budesonide-formoterol (SYMBICORT) 160-4.5 MCG/ACT inhaler Inhale 2 puffs into the lungs 2 (two) times daily. 3 Inhaler 1 04/23/2017 at Unknown time  . clindamycin (CLEOCIN) 300 MG capsule Take 1 capsule (300 mg total) by mouth every 6 (six)  hours. 30 capsule 0 04/23/2017 at Unknown time  . clopidogrel (PLAVIX) 75 MG tablet TAKE (1) TABLET BY MOUTH ONCE DAILY. 30 tablet 0 04/23/2017 at 800  . doxycycline (VIBRA-TABS) 100 MG tablet Take 1 tablet by mouth 2 (two) times daily.   04/23/2017 at Unknown time  . furosemide (LASIX) 40 MG tablet Take 40 mg by mouth daily.     04/23/2017 at Unknown time  . hydrALAZINE (APRESOLINE) 25 MG tablet Take 1 tablet (25 mg total) by mouth 2 (two) times daily. 60 tablet 6 04/23/2017 at Unknown time  . losartan (COZAAR) 100 MG tablet Take 1 tablet (100 mg total) by mouth daily. 90 tablet 3 04/23/2017 at Unknown time  . metFORMIN (GLUCOPHAGE) 500 MG tablet Take 500 mg by mouth 2 (two) times daily with a meal.    04/23/2017 at Unknown time  . metoprolol succinate (TOPROL-XL) 25 MG 24 hr tablet Take 1 tablet (25 mg total) by mouth daily. 90 tablet 3 04/23/2017 at 800  . sertraline (ZOLOFT) 50 MG tablet Take 50 mg by mouth daily.     04/23/2017 at Unknown time  . SPIRIVA HANDIHALER 18 MCG inhalation capsule Place 18 mcg into inhaler and inhale daily.    04/23/2017 at Unknown time   Scheduled: . allopurinol  300 mg Oral Daily  . amLODipine  10 mg Oral Daily  . atorvastatin  20 mg Oral q1800  . azelastine  2 spray Each Nare BID  . baclofen  10 mg Oral BID  . clopidogrel  75 mg Oral Daily  . heparin  5,000 Units Subcutaneous Q8H  . hydrALAZINE  25 mg Oral BID   . metoprolol succinate  25 mg Oral Daily  . mometasone-formoterol  2 puff Inhalation BID  . nicotine  21 mg Transdermal Daily  . sertraline  50 mg Oral Daily  . tiotropium  18 mcg Inhalation Daily   Continuous: . cefTRIAXone (ROCEPHIN)  IV Stopped (04/24/17 2203)   [id number] **OR** acetaminophen, albuterol, bisacodyl, HYDROcodone-acetaminophen, morphine injection, ondansetron **OR** ondansetron (ZOFRAN) IV, senna-docusate  Assesment: He was admitted with cellulitis which is much improved. He had acute on chronic right knee pain and he's had an injection of steroids. He had withdrawal of fluid and appears to be better. Principal Problem:   Cellulitis Active Problems:   Hypertension, essential   Stage 2 moderate COPD by GOLD classification (HCC)   Obstructive sleep apnea on CPAP   Chronic knee pain   AKI (acute kidney injury) ([place])   Chronic diastolic CHF (congestive heart failure) ([place])    Plan: Continue treatments    LOS: 2 days   Talik Casique L 04/25/2017, 10:28 AM

## 2017-04-26 ENCOUNTER — Other Ambulatory Visit: Payer: Self-pay

## 2017-04-26 LAB — GLUCOSE, CAPILLARY: GLUCOSE-CAPILLARY: 128 mg/dL — AB (ref 65–99)

## 2017-04-26 NOTE — Progress Notes (Signed)
Subjective: He says he feels well. No complaints. His knee is much better. He still has some swelling of his left leg but the erythema is markedly improved  Objective: Vital signs in last 24 hours: Temp:  [98 F (36.7 C)-98.4 F (36.9 C)] 98 F (36.7 C) (11/04 0510) Pulse Rate:  [56-62] 62 (11/04 0510) Resp:  [18-20] 20 (11/04 0510) BP: (129-138)/(69-76) 129/76 (11/04 0510) SpO2:  [90 %-96 %] 90 % (11/04 0752) Weight:  [100.9 kg (222 lb 6.4 oz)] 100.9 kg (222 lb 6.4 oz) (11/04 0510) Weight change: 1.043 kg (2 lb 4.8 oz) Last BM Date: 04/23/17  Intake/Output from previous day: 11/03 0701 - 11/04 0700 In: 820 [P.O.:720; IV Piggyback:100] Out: 1050 [Urine:1050]  PHYSICAL EXAM General appearance: alert, cooperative and no distress Resp: rhonchi bilaterally Cardio: regular rate and rhythm, S1, S2 normal, no murmur, click, rub or gallop GI: soft, non-tender; bowel sounds normal; no masses,  no organomegaly Extremities: His knee is much improved. He still has some erythema of his leg but that is also much improved Throat is clear  Lab Results:  Results for orders placed or performed during the hospital encounter of 04/23/17 (from the past 48 hour(s))  Protein, body fluid (other)     Status: None   Collection Time: 04/24/17 11:29 AM  Result Value Ref Range   Total Protein, Body Fluid Other 2.9 g/dL    Comment: (NOTE) ________________________________________________________ :  Peritoneal  :       Pleural          :   Synovial     : :______________:________________________:________________: :              : Transudate :  Exudate  :                : :______________:____________:___________:________________: :  Not Estab.  :   <3 g/dL  :  >3 g/dL  :    <2.5 g/dL   : :______________:____________:___________:________________: The method performance specifications have not been established for this test in body fluid. The test result should be integrated into the clinical context  for interpretation. The method performance specifications have not been established for this test in body fluid.  The test result should be integrated into the clinical context for interpretation. Performed At: [facility] [place], [place] [id number] Rush Farmer MD TA:[id number]    Source of Sample SYNOVIAL   Synovial cell count + diff, w/ crystals     Status: Abnormal   Collection Time: 04/24/17 11:29 AM  Result Value Ref Range   Color, Synovial YELLOW (A) YELLOW   Appearance-Synovial CLOUDY (A) CLEAR   Crystals, Fluid NO CRYSTALS SEEN    WBC, Synovial 1,885 (H) 0 - 200 /cu mm   Neutrophil, Synovial 91 (H) 0 - 25 %   Lymphocytes-Synovial Fld 1 0 - 20 %   Monocyte-Macrophage-Synovial Fluid 8 (L) 50 - 90 %   Eosinophils-Synovial 0 0 - 1 %  Culture, body fluid-bottle     Status: None (Preliminary result)   Collection Time: 04/24/17 11:29 AM  Result Value Ref Range   Specimen Description SYNOVIAL    Special Requests BOTTLES DRAWN AEROBIC AND ANAEROBIC 10CC    Culture NO GROWTH 2 DAYS    Report Status PENDING   Gram stain     Status: None   Collection Time: 04/24/17 11:29 AM  Result Value Ref Range   Specimen Description SYNOVIAL    Special Requests NONE  Gram Stain      WBC PRESENT,BOTH PMN AND MONONUCLEAR NO ORGANISMS SEEN CYTOSPIN SMEAR    Report Status 04/24/2017 FINAL   Sedimentation rate     Status: Abnormal   Collection Time: 04/24/17  1:17 PM  Result Value Ref Range   Sed Rate 47 (H) 0 - 16 mm/hr  C-reactive protein     Status: Abnormal   Collection Time: 04/24/17  1:17 PM  Result Value Ref Range   CRP 9.5 (H) <1.0 mg/dL    Comment: Performed at [facility], [place] [address] [place], [place] [address]  Glucose, capillary     Status: Abnormal   Collection Time: 04/25/17  7:45 AM  Result Value Ref Range   Glucose-Capillary 142 (H) 65 - 99 mg/dL   Comment 1 Notify RN    Comment 2 Document in Chart   Glucose, capillary      Status: Abnormal   Collection Time: 04/26/17  7:47 AM  Result Value Ref Range   Glucose-Capillary 128 (H) 65 - 99 mg/dL   Comment 1 Notify RN    Comment 2 Document in Chart     ABGS No results for input(s): PHART, PO2ART, TCO2, HCO3 in the last 72 hours.  Invalid input(s): PCO2 CULTURES Recent Results (from the past 240 hour(s))  Culture, blood (routine x 2)     Status: None (Preliminary result)   Collection Time: 04/23/17  5:55 PM  Result Value Ref Range Status   Specimen Description LEFT ANTECUBITAL  Final   Special Requests   Final    BOTTLES DRAWN AEROBIC AND ANAEROBIC Blood Culture adequate volume   Culture NO GROWTH 3 DAYS  Final   Report Status PENDING  Incomplete  Culture, blood (routine x 2)     Status: None (Preliminary result)   Collection Time: 04/23/17  6:15 PM  Result Value Ref Range Status   Specimen Description RIGHT ANTECUBITAL  Final   Special Requests   Final    BOTTLES DRAWN AEROBIC AND ANAEROBIC Blood Culture adequate volume   Culture NO GROWTH 3 DAYS  Final   Report Status PENDING  Incomplete  Culture, body fluid-bottle     Status: None (Preliminary result)   Collection Time: 04/24/17 11:29 AM  Result Value Ref Range Status   Specimen Description SYNOVIAL  Final   Special Requests BOTTLES DRAWN AEROBIC AND ANAEROBIC 10CC  Final   Culture NO GROWTH 2 DAYS  Final   Report Status PENDING  Incomplete  Gram stain     Status: None   Collection Time: 04/24/17 11:29 AM  Result Value Ref Range Status   Specimen Description SYNOVIAL  Final   Special Requests NONE  Final   Gram Stain   Final    WBC PRESENT,BOTH PMN AND MONONUCLEAR NO ORGANISMS SEEN CYTOSPIN SMEAR    Report Status 04/24/2017 FINAL  Final   Studies/Results: Dg Fluoro Guided Needle Plc Aspiration/injection Loc  Result Date: 04/24/2017 CLINICAL DATA:  RIGHT KNEE PAIN AND SWELLING. EXAM: RIGHT KNEE ASPIRATION. PROCEDURE: Overlying skin prepped with Betadine, draped in the usual sterile  fashion, and infiltrated locally with buffered Lidocaine. 20 gauge needle inserted into the right knee joint using a medial approach. 30 cc of straw-colored fluid aspirated without difficulty. Fluid sent for laboratory testing. The patient tolerated the procedure well. No immediate complications. IMPRESSION: Technically successful right knee joint aspiration. Electronically Signed   By: Lorriane Shire M.D.   On: 04/24/2017 11:56    Medications:  Prior to  Admission:  Facility-Administered Medications Prior to Admission  Medication Dose Route Frequency Provider Last Rate Last Dose  . ipratropium (ATROVENT) nebulizer solution 0.5 mg  0.5 mg Nebulization Once Gean Quint, MD      . levalbuterol Penne Lash) nebulizer solution 1.25 mg  1.25 mg Nebulization Once Gean Quint, MD       Medications Prior to Admission  Medication Sig Dispense Refill Last Dose  . albuterol (PROVENTIL HFA;VENTOLIN HFA) 108 (90 BASE) MCG/ACT inhaler Inhale 2 puffs into the lungs every 6 (six) hours as needed (rescue inhaler).    Taking  . allopurinol (ZYLOPRIM) 300 MG tablet Take 300 mg by mouth daily.     04/23/2017 at Unknown time  . amLODipine (NORVASC) 10 MG tablet Take 1 tablet (10 mg total) by mouth daily. 90 tablet 3 04/23/2017 at Unknown time  . atorvastatin (LIPITOR) 20 MG tablet Take 20 mg by mouth daily.    04/23/2017 at Unknown time  . azelastine (ASTELIN) 0.1 % nasal spray Place 2 sprays into both nostrils 2 (two) times daily.   04/22/2017 at Unknown time  . baclofen (LIORESAL) 10 MG tablet Take 1 tablet by mouth 2 (two) times daily.   04/23/2017 at Unknown time  . budesonide-formoterol (SYMBICORT) 160-4.5 MCG/ACT inhaler Inhale 2 puffs into the lungs 2 (two) times daily. 3 Inhaler 1 04/23/2017 at Unknown time  . clindamycin (CLEOCIN) 300 MG capsule Take 1 capsule (300 mg total) by mouth every 6 (six) hours. 30 capsule 0 04/23/2017 at Unknown time  . clopidogrel (PLAVIX) 75 MG tablet TAKE (1) TABLET BY MOUTH  ONCE DAILY. 30 tablet 0 04/23/2017 at 800  . doxycycline (VIBRA-TABS) 100 MG tablet Take 1 tablet by mouth 2 (two) times daily.   04/23/2017 at Unknown time  . furosemide (LASIX) 40 MG tablet Take 40 mg by mouth daily.     04/23/2017 at Unknown time  . hydrALAZINE (APRESOLINE) 25 MG tablet Take 1 tablet (25 mg total) by mouth 2 (two) times daily. 60 tablet 6 04/23/2017 at Unknown time  . losartan (COZAAR) 100 MG tablet Take 1 tablet (100 mg total) by mouth daily. 90 tablet 3 04/23/2017 at Unknown time  . metFORMIN (GLUCOPHAGE) 500 MG tablet Take 500 mg by mouth 2 (two) times daily with a meal.    04/23/2017 at Unknown time  . metoprolol succinate (TOPROL-XL) 25 MG 24 hr tablet Take 1 tablet (25 mg total) by mouth daily. 90 tablet 3 04/23/2017 at 800  . sertraline (ZOLOFT) 50 MG tablet Take 50 mg by mouth daily.     04/23/2017 at Unknown time  . SPIRIVA HANDIHALER 18 MCG inhalation capsule Place 18 mcg into inhaler and inhale daily.    04/23/2017 at Unknown time   Scheduled: . allopurinol  300 mg Oral Daily  . amLODipine  10 mg Oral Daily  . atorvastatin  20 mg Oral q1800  . azelastine  2 spray Each Nare BID  . baclofen  10 mg Oral BID  . clopidogrel  75 mg Oral Daily  . heparin  5,000 Units Subcutaneous Q8H  . hydrALAZINE  25 mg Oral BID  . metoprolol succinate  25 mg Oral Daily  . mometasone-formoterol  2 puff Inhalation BID  . nicotine  21 mg Transdermal Daily  . sertraline  50 mg Oral Daily  . tiotropium  18 mcg Inhalation Daily   Continuous: . cefTRIAXone (ROCEPHIN)  IV Stopped (04/25/17 2135)   TGY:[id number] **OR** acetaminophen, albuterol, bisacodyl, HYDROcodone-acetaminophen, morphine injection, ondansetron **OR**  ondansetron (ZOFRAN) IV, senna-docusate  Assesment: He had cellulitis which is improved. He has knee pain which is better after injection. He is ready for discharge Principal Problem:   Cellulitis Active Problems:   Hypertension, essential   Stage 2 moderate COPD by  GOLD classification (HCC)   Obstructive sleep apnea on CPAP   Chronic knee pain   AKI (acute kidney injury) ([place])   Chronic diastolic CHF (congestive heart failure) ([place])    Plan: Discharge home    LOS: 3 days   Salayah Meares L 04/26/2017, 10:31 AM

## 2017-04-26 NOTE — Progress Notes (Signed)
Patient states understanding of discharge instructions.  

## 2017-04-26 NOTE — Discharge Summary (Signed)
Physician Discharge Summary  Patient ID: Kevin Frank MRN: [id number] DOB/AGE: 1941-02-01 76 y.o. Primary Care Physician:Brittini Brubeck, Percell Miller, MD Admit date: 04/23/2017 Discharge date: 04/26/2017    Discharge Diagnoses:   Principal Problem:   Cellulitis Active Problems:   Hypertension, essential   Stage 2 moderate COPD by GOLD classification (HCC)   Obstructive sleep apnea on CPAP   Chronic knee pain   AKI (acute kidney injury) ([place])   Chronic diastolic CHF (congestive heart failure) (HCC) Chondrocalcinosis  Allergies as of 04/26/2017      Reactions   No Known Allergies       Medication List    TAKE these medications   albuterol 108 (90 Base) MCG/ACT inhaler Commonly known as:  PROVENTIL HFA;VENTOLIN HFA Inhale 2 puffs into the lungs every 6 (six) hours as needed (rescue inhaler).   allopurinol 300 MG tablet Commonly known as:  ZYLOPRIM Take 300 mg by mouth daily.   amLODipine 10 MG tablet Commonly known as:  NORVASC Take 1 tablet (10 mg total) by mouth daily.   atorvastatin 20 MG tablet Commonly known as:  LIPITOR Take 20 mg by mouth daily.   azelastine 0.1 % nasal spray Commonly known as:  ASTELIN Place 2 sprays into both nostrils 2 (two) times daily.   baclofen 10 MG tablet Commonly known as:  LIORESAL Take 1 tablet by mouth 2 (two) times daily.   budesonide-formoterol 160-4.5 MCG/ACT inhaler Commonly known as:  SYMBICORT Inhale 2 puffs into the lungs 2 (two) times daily.   clindamycin 300 MG capsule Commonly known as:  CLEOCIN Take 1 capsule (300 mg total) by mouth every 6 (six) hours.   clopidogrel 75 MG tablet Commonly known as:  PLAVIX TAKE (1) TABLET BY MOUTH ONCE DAILY.   doxycycline 100 MG tablet Commonly known as:  VIBRA-TABS Take 1 tablet by mouth 2 (two) times daily.   furosemide 40 MG tablet Commonly known as:  LASIX Take 40 mg by mouth daily.   hydrALAZINE 25 MG tablet Commonly known as:  APRESOLINE Take 1 tablet (25 mg total) by  mouth 2 (two) times daily.   losartan 100 MG tablet Commonly known as:  COZAAR Take 1 tablet (100 mg total) by mouth daily.   metFORMIN 500 MG tablet Commonly known as:  GLUCOPHAGE Take 500 mg by mouth 2 (two) times daily with a meal.   metoprolol succinate 25 MG 24 hr tablet Commonly known as:  TOPROL-XL Take 1 tablet (25 mg total) by mouth daily.   sertraline 50 MG tablet Commonly known as:  ZOLOFT Take 50 mg by mouth daily.   SPIRIVA HANDIHALER 18 MCG inhalation capsule Generic drug:  tiotropium Place 18 mcg into inhaler and inhale daily.       Discharged Condition: Improved    Consults: Orthopedics  Significant Diagnostic Studies: Dg Chest 2 View  Result Date: 04/23/2017 CLINICAL DATA:  Left lower leg cellulitis. EXAM: CHEST  2 VIEW COMPARISON:  04/09/2016 FINDINGS: Lungs are hyperexpanded. The lungs are clear without focal pneumonia, edema, pneumothorax or pleural effusion. Interstitial markings are diffusely coarsened with chronic features. Cardiopericardial silhouette is at upper limits of normal for size. Bones are diffusely demineralized. Telemetry leads overlie the chest. IMPRESSION: Hyperexpansion with chronic interstitial changes. No acute cardiopulmonary findings. Electronically Signed   By: Misty Stanley M.D.   On: 04/23/2017 20:17   Dg Ankle Complete Left  Result Date: 04/12/2017 CLINICAL DATA:  Injury.  Ankle pain EXAM: LEFT ANKLE COMPLETE - 3+ VIEW COMPARISON:  None. FINDINGS:  Normal alignment. Normal ankle joint. Negative for fracture. Bilateral soft tissue swelling. Negative for joint effusion. IMPRESSION: Negative for fracture Electronically Signed   By: Franchot Gallo M.D.   On: 04/12/2017 14:03   US Venous Img Lower Unilateral Left  Result Date: 04/24/2017 CLINICAL DATA:  Left lower extremity swelling and cellulitis for 3 weeks. EXAM: LEFT LOWER EXTREMITY VENOUS DUPLEX ULTRASOUND TECHNIQUE: Doppler venous assessment of the left lower extremity deep  venous system was performed, including characterization of spectral flow, compressibility, and phasicity. COMPARISON:  None. FINDINGS: There is complete compressibility of the left common femoral, femoral, and popliteal veins. Doppler analysis demonstrates respiratory phasicity and augmentation of flow with calf compression. No obvious superficial vein or calf vein thrombosis. 3.6 x 1.3 x 3.5 cm Baker's cyst is present in the left popliteal fossa. IMPRESSION: No evidence of DVT. Baker's cyst. Electronically Signed   By: Marybelle Killings M.D.   On: 04/24/2017 10:08   Dg Knee Complete 4 Views Left  Result Date: 04/12/2017 CLINICAL DATA:  Knee pain ankle pain EXAM: LEFT KNEE - COMPLETE 4+ VIEW COMPARISON:  None. FINDINGS: Negative for fracture or effusion. Bilateral chondrocalcinosis. No significant joint space narrowing. IMPRESSION: Chondrocalcinosis.  No acute abnormality. Electronically Signed   By: Franchot Gallo M.D.   On: 04/12/2017 13:48   Dg Knee Complete 4 Views Right  Result Date: 04/23/2017 CLINICAL DATA:  Left lower leg cellulitis. EXAM: RIGHT KNEE - COMPLETE 4+ VIEW COMPARISON:  08/25/2014 FINDINGS: No fracture. No subluxation or dislocation. Meniscal calcification is evident. Small joint effusion noted. No worrisome lytic or sclerotic osseous abnormality IMPRESSION: Stable.  No acute findings. Electronically Signed   By: Misty Stanley M.D.   On: 04/23/2017 20:19   Dg Fluoro Guided Needle Plc Aspiration/injection Loc  Result Date: 04/24/2017 CLINICAL DATA:  RIGHT KNEE PAIN AND SWELLING. EXAM: RIGHT KNEE ASPIRATION. PROCEDURE: Overlying skin prepped with Betadine, draped in the usual sterile fashion, and infiltrated locally with buffered Lidocaine. 20 gauge needle inserted into the right knee joint using a medial approach. 30 cc of straw-colored fluid aspirated without difficulty. Fluid sent for laboratory testing. The patient tolerated the procedure well. No immediate complications. IMPRESSION:  Technically successful right knee joint aspiration. Electronically Signed   By: Lorriane Shire M.D.   On: 04/24/2017 11:56    Lab Results: Basic Metabolic Panel: Recent Labs    04/23/17 1815 04/24/17 0438  NA 137 136  K 4.5 4.4  CL 101 101  CO2 26 26  GLUCOSE 109* 121*  BUN 23* 21*  CREATININE 1.49* 1.27*  CALCIUM 9.2 8.9   Liver Function Tests: No results for input(s): AST, ALT, ALKPHOS, BILITOT, PROT, ALBUMIN in the last 72 hours.   CBC: Recent Labs    04/23/17 1815 04/24/17 0438  WBC 10.9* 9.6  NEUTROABS 7.5 6.0  HGB 15.3 14.5  HCT 44.0 44.0  MCV 95.9 98.0  PLT 215 199    Recent Results (from the past 240 hour(s))  Culture, blood (routine x 2)     Status: None (Preliminary result)   Collection Time: 04/23/17  5:55 PM  Result Value Ref Range Status   Specimen Description LEFT ANTECUBITAL  Final   Special Requests   Final    BOTTLES DRAWN AEROBIC AND ANAEROBIC Blood Culture adequate volume   Culture NO GROWTH 3 DAYS  Final   Report Status PENDING  Incomplete  Culture, blood (routine x 2)     Status: None (Preliminary result)   Collection Time: 04/23/17  6:15 PM  Result Value Ref Range Status   Specimen Description RIGHT ANTECUBITAL  Final   Special Requests   Final    BOTTLES DRAWN AEROBIC AND ANAEROBIC Blood Culture adequate volume   Culture NO GROWTH 3 DAYS  Final   Report Status PENDING  Incomplete  Culture, body fluid-bottle     Status: None (Preliminary result)   Collection Time: 04/24/17 11:29 AM  Result Value Ref Range Status   Specimen Description SYNOVIAL  Final   Special Requests BOTTLES DRAWN AEROBIC AND ANAEROBIC 10CC  Final   Culture NO GROWTH 2 DAYS  Final   Report Status PENDING  Incomplete  Gram stain     Status: None   Collection Time: 04/24/17 11:29 AM  Result Value Ref Range Status   Specimen Description SYNOVIAL  Final   Special Requests NONE  Final   Gram Stain   Final    WBC PRESENT,BOTH PMN AND MONONUCLEAR NO ORGANISMS  SEEN CYTOSPIN SMEAR    Report Status 04/24/2017 FINAL  Final     Hospital Course: He was admitted to the hospital with cellulitis but also is complaining of severe fairly acute right  knee pain. He was started on IV Rocephin. He had been on oral antibiotics. Orthopedic consultation was obtained and he underwent aspiration of his right knee and had an injection. He was markedly improved at the time of discharge.  Discharge Exam: Blood pressure 129/76, pulse 62, temperature 98 F (36.7 C), temperature source Other (Comment), resp. rate 20, height 5\' 10"  (1.778 m), weight 100.9 kg (222 lb 6.4 oz), SpO2 90 %. He's awake and alert. Leg looks much better on the left where he had the cellulitis. His knee is much better after the injection.  Disposition: Home  Discharge Instructions    Diet - low sodium heart healthy   Complete by:  As directed    Increase activity slowly   Complete by:  As directed         Signed: Gyanna Jarema L   04/26/2017, 10:34 AM

## 2017-04-27 LAB — URINE CULTURE: Culture: NO GROWTH

## 2017-04-28 LAB — CULTURE, BLOOD (ROUTINE X 2)
CULTURE: NO GROWTH
Culture: NO GROWTH
SPECIAL REQUESTS: ADEQUATE
Special Requests: ADEQUATE

## 2017-04-29 LAB — CULTURE, BODY FLUID-BOTTLE

## 2017-04-29 LAB — CULTURE, BODY FLUID W GRAM STAIN -BOTTLE: Culture: NO GROWTH

## 2017-05-04 DIAGNOSIS — J441 Chronic obstructive pulmonary disease with (acute) exacerbation: Secondary | ICD-10-CM | POA: Diagnosis not present

## 2017-05-04 DIAGNOSIS — E119 Type 2 diabetes mellitus without complications: Secondary | ICD-10-CM | POA: Diagnosis not present

## 2017-05-04 DIAGNOSIS — M25561 Pain in right knee: Secondary | ICD-10-CM | POA: Diagnosis not present

## 2017-05-04 DIAGNOSIS — I10 Essential (primary) hypertension: Secondary | ICD-10-CM | POA: Diagnosis not present

## 2017-05-11 ENCOUNTER — Inpatient Hospital Stay: Admission: RE | Admit: 2017-05-11 | Payer: Medicare HMO | Source: Ambulatory Visit

## 2017-06-01 ENCOUNTER — Encounter (HOSPITAL_COMMUNITY): Payer: Medicare HMO

## 2017-06-01 ENCOUNTER — Other Ambulatory Visit (HOSPITAL_COMMUNITY): Payer: Medicare HMO

## 2017-06-01 ENCOUNTER — Ambulatory Visit: Payer: Medicare HMO | Admitting: Surgery

## 2017-06-03 ENCOUNTER — Encounter: Payer: Self-pay | Admitting: Nurse Practitioner

## 2017-06-08 DIAGNOSIS — L570 Actinic keratosis: Secondary | ICD-10-CM | POA: Diagnosis not present

## 2017-06-08 DIAGNOSIS — D485 Neoplasm of uncertain behavior of skin: Secondary | ICD-10-CM | POA: Diagnosis not present

## 2017-06-08 DIAGNOSIS — B353 Tinea pedis: Secondary | ICD-10-CM | POA: Diagnosis not present

## 2017-06-08 DIAGNOSIS — D2272 Melanocytic nevi of left lower limb, including hip: Secondary | ICD-10-CM | POA: Diagnosis not present

## 2017-06-09 ENCOUNTER — Ambulatory Visit: Payer: Medicare HMO | Admitting: Nurse Practitioner

## 2017-06-09 NOTE — Progress Notes (Deleted)
CARDIOLOGY OFFICE NOTE  Date:  06/09/2017    Kevin Frank Date of Birth: 1941-02-25 Medical Record #[id number]  PCP:  Sinda Du, MD  Cardiologist:  Jerel Shepherd  No chief complaint on file.   History of Present Illness: Kevin Frank is a 76 y.o. male who presents today for a 3 month check. Seen for Dr. Burt Knack. He is a former patient of Dr. Susa Simmonds.   He has a history of hypertension, hyperlipidemia, DM, tobacco abuse, COPD, and abdominal aortic aneurysm. The patient's aneurysm surveillance is performed by [facility]. At his recent assessment his AAA measured 4.9cm - this was mildly increased from prior study. He is scheduled for another study in 6 months. He continues to smoke cigarettes. He has not had any known CAD.   Last seen back in September of 2016 by Dr. Burt Knack. Cardiac status was stable. Does have shortness of breath - continues to smoke - not interested in stopping.   I last saw him back in August of 2017- he was doing ok - needing to get his AAA repaired - still smoking. Pretty active despite his COPD. Recent CT with a lung nodule noted - this turned out ok.   On 04/09/2016 he underwent FEVARwith a single rightrenal fenestration by Dr. Trula Slade. He also had left renal angioplasty at the time of the main body deployment service to ensure that the graft not cover the renal artery because of angulation. The day following his procedure, his creatinine increased and ultrasound did not show good perfusion to the left kidney and therefore he went to the Cath Lab and had stenting of his left renal artery. His creatinine trended down and his urine output improved. The patient also had issues with left leg numbness in his legs after the procedure. Neurology was consulted. It was felt this was most likely secondary to lower back disease that was aggravated during surgery.  I saw him back in August - having lots of legal issues - BP was up - ARB was  increased. last seen in September - BP remained up and I added Hydralazine.   Comes in today. Here with his wife today Kevin Frank. BP remains high.   Past Medical History:  Diagnosis Date  . AAA (abdominal aortic aneurysm) ([place]) 01/2008   3.2cm  . BPH (benign prostatic hyperplasia)   . Chronic knee pain    bilateral  . COPD (chronic obstructive pulmonary disease) ([place])   . Diabetes mellitus    type II  . Gout   . History of kidney stones   . Hypercholesterolemia   . Hyperlipidemia   . Hypertension   . Normal nuclear stress test 2011  . Obesity   . Obstructive sleep apnea on CPAP   . Presence of indwelling urinary catheter   . Shortness of breath    due to cigarette abuse  . Tobacco abuse     Past Surgical History:  Procedure Laterality Date  . ABDOMINAL AORTIC ENDOVASCULAR FENESTRATED STENT GRAFT N/A 04/09/2016   Procedure: ABDOMINAL AORTIC ENDOVASCULAR FENESTRATED STENT GRAFT with left brachial artery access using ultrasound guidance;  Surgeon: Serafina Mitchell, MD;  Location: [name];  Service: Vascular;  Laterality: N/A;  . [place]   [place]  . PERIPHERAL VASCULAR CATHETERIZATION N/A 04/10/2016   Procedure: Renal Angiography;  Surgeon: Angelia Mould, MD;  Location: [facility];  Service: Cardiovascular;  Laterality: N/A;  . TRANSURETHRAL RESECTION OF PROSTATE  01/07/2011  Procedure: TRANSURETHRAL RESECTION OF THE PROSTATE (TURP);  Surgeon: Marissa Nestle;  Location: [facility];  Service: Urology;  Laterality: N/A;     Medications: No outpatient medications have been marked as taking for the 06/09/17 encounter (Appointment) with Burtis Junes, NP.   Current Facility-Administered Medications for the 06/09/17 encounter (Appointment) with Burtis Junes, NP  Medication  . ipratropium (ATROVENT) nebulizer solution 0.5 mg  . levalbuterol (XOPENEX) nebulizer solution 1.25 mg     Allergies: Allergies  Allergen Reactions  . No Known Allergies       Social History: The patient  reports that he has been smoking cigarettes.  He has a 100.00 pack-year smoking history. he has never used smokeless tobacco. He reports that he drinks alcohol. He reports that he does not use drugs.   Family History: The patient's family history includes Cancer in his mother; Diabetes in his mother; Hypertension in his mother; Liver disease in his father; Lymphoma in his mother; Mental retardation in his unknown relative.   Review of Systems: Please see the history of present illness.   Otherwise, the review of systems is positive for none.   All other systems are reviewed and negative.   Physical Exam: VS:  There were no vitals taken for this visit. [name]  BMI There is no height or weight on file to calculate BMI.  Wt Readings from Last 3 Encounters:  04/26/17 222 lb 6.4 oz (100.9 kg)  04/12/17 210 lb (95.3 kg)  03/18/17 230 lb (104.3 kg)    General: Pleasant. Well developed, well nourished and in no acute distress.   HEENT: Normal.  Neck: Supple, no JVD, carotid bruits, or masses noted.  Cardiac: Regular rate and rhythm. No murmurs, rubs, or gallops. No edema.  Respiratory:  Lungs are clear to auscultation bilaterally with normal work of breathing.  GI: Soft and nontender.  MS: No deformity or atrophy. Gait and ROM intact.  Skin: Warm and dry. Color is normal.  Neuro:  Strength and sensation are intact and no gross focal deficits noted.  Psych: Alert, appropriate and with normal affect.   LABORATORY DATA:  EKG:  EKG is not ordered today.  Lab Results  Component Value Date   WBC 9.6 04/24/2017   HGB 14.5 04/24/2017   HCT 44.0 04/24/2017   PLT 199 04/24/2017   GLUCOSE 121 (H) 04/24/2017   CHOL 138 12/16/2010   TRIG 53.0 12/16/2010   HDL 46.10 12/16/2010   LDLCALC 81 12/16/2010   ALT 23 04/02/2016   AST 18 04/02/2016   NA 136 04/24/2017   K 4.4 04/24/2017   CL 101 04/24/2017   CREATININE 1.27 (H) 04/24/2017   BUN 21 (H) 04/24/2017    CO2 26 04/24/2017   INR 1.08 04/09/2016   HGBA1C 6.0 (H) 04/02/2016     BNP (last 3 results) No results for input(s): BNP in the last 8760 hours.  ProBNP (last 3 results) No results for input(s): PROBNP in the last 8760 hours.   Other Studies Reviewed Today:  Myoview Study Highlights from 02/2016   Nuclear stress EF: 58%.  There was no ST segment deviation noted during stress.  The study is normal.  This is a low risk study.    Echo Study Conclusions from 02/2016  - Left ventricle: The cavity size was normal. Wall thickness was increased in a pattern of mild LVH. Systolic function was normal. The estimated ejection fraction was in the range of 60% to 65%. Wall motion was normal;  there were no regional wall motion abnormalities. Features are consistent with a pseudonormal left ventricular filling pattern, with concomitant abnormal relaxation and increased filling pressure (grade 2 diastolic dysfunction). Doppler parameters are consistent with high ventricular filling pressure. - Mitral valve: There was mild regurgitation. - Left atrium: The atrium was moderately dilated. - Pulmonary arteries: Systolic pressure was mildly increased. PA peak pressure: 37 mm Hg (S).  Impressions:  - Normal LV systolic function; grade 2 diastolic dysfunction with elevated LV filling pressure; mild MR; moderate LAE; trace TR with mildly elevated pulmonary pressure.  CT ANGIO CHEST/ABD/PELVIS IMPRESSION: 1. Patent suprarenal bifurcated aortic stent graft without endoleak, native sac diameter 5.3 cm. 2. Coronary and Aortic Atherosclerosis (ICD10-170.0) without dissection. 3. Descending and sigmoid diverticulosis.   Electronically Signed By: Lucrezia Europe M.D. On: 05/16/2016 11:02    Assessment/Plan:  1.HTN - BP remains elevated - recheck by me is 160/80. Adding Hydralazine 25 mg BID. I have asked to try and check some readings at home - he  has a machine - would like to get him at least under 607 systolic.  2. HLD - on statin - labs checked by PCP - done back in April.   3. Long standing tobacco - not interested in stopping.   4. Prior AAA/FEVAR repair with stenting of the left renal artery - followed by [facility].   5. Cough - if fails to improve he needs to see PCP and get CXR  6. Insomnia - would not recommend sleeping pills. Ok to try some melatonin. I think a lot of this is stress related.   Current medicines are reviewed with the patient today.  The patient does not have concerns regarding medicines other than what has been noted above.  The following changes have been made:  See above.  Labs/ tests ordered today include:   No orders of the defined types were placed in this encounter.    Disposition:   FU with me in 6 months.   Patient is agreeable to this plan and will call if any problems develop in the interim.   SignedTruitt Merle, NP  06/09/2017 9:03 AM  [facility] [address] [place], [place]  [address] Phone: [phone] Fax: [phone]

## 2017-06-11 ENCOUNTER — Encounter: Payer: Self-pay | Admitting: Nurse Practitioner

## 2017-06-11 DIAGNOSIS — E119 Type 2 diabetes mellitus without complications: Secondary | ICD-10-CM | POA: Diagnosis not present

## 2017-06-11 DIAGNOSIS — I10 Essential (primary) hypertension: Secondary | ICD-10-CM | POA: Diagnosis not present

## 2017-06-11 DIAGNOSIS — G4733 Obstructive sleep apnea (adult) (pediatric): Secondary | ICD-10-CM | POA: Diagnosis not present

## 2017-06-11 DIAGNOSIS — J449 Chronic obstructive pulmonary disease, unspecified: Secondary | ICD-10-CM | POA: Diagnosis not present

## 2017-06-23 DIAGNOSIS — C3490 Malignant neoplasm of unspecified part of unspecified bronchus or lung: Secondary | ICD-10-CM

## 2017-06-23 HISTORY — DX: Malignant neoplasm of unspecified part of unspecified bronchus or lung: C34.90

## 2017-07-04 ENCOUNTER — Encounter (HOSPITAL_COMMUNITY): Payer: Self-pay

## 2017-07-04 ENCOUNTER — Other Ambulatory Visit: Payer: Self-pay

## 2017-07-04 ENCOUNTER — Emergency Department (HOSPITAL_COMMUNITY)
Admission: EM | Admit: 2017-07-04 | Discharge: 2017-07-04 | Disposition: A | Discharge status: Home / Self Care | Payer: Medicare HMO | Attending: Emergency Medicine | Admitting: Emergency Medicine

## 2017-07-04 ENCOUNTER — Emergency Department (HOSPITAL_COMMUNITY): Payer: Medicare HMO

## 2017-07-04 DIAGNOSIS — Z7984 Long term (current) use of oral hypoglycemic drugs: Secondary | ICD-10-CM | POA: Diagnosis not present

## 2017-07-04 DIAGNOSIS — M7989 Other specified soft tissue disorders: Secondary | ICD-10-CM | POA: Diagnosis not present

## 2017-07-04 DIAGNOSIS — M25562 Pain in left knee: Secondary | ICD-10-CM | POA: Diagnosis not present

## 2017-07-04 DIAGNOSIS — Z79899 Other long term (current) drug therapy: Secondary | ICD-10-CM | POA: Diagnosis not present

## 2017-07-04 DIAGNOSIS — J449 Chronic obstructive pulmonary disease, unspecified: Secondary | ICD-10-CM | POA: Insufficient documentation

## 2017-07-04 DIAGNOSIS — Z7902 Long term (current) use of antithrombotics/antiplatelets: Secondary | ICD-10-CM | POA: Diagnosis not present

## 2017-07-04 DIAGNOSIS — R239 Unspecified skin changes: Secondary | ICD-10-CM | POA: Diagnosis not present

## 2017-07-04 DIAGNOSIS — F17210 Nicotine dependence, cigarettes, uncomplicated: Secondary | ICD-10-CM | POA: Insufficient documentation

## 2017-07-04 DIAGNOSIS — E785 Hyperlipidemia, unspecified: Secondary | ICD-10-CM | POA: Diagnosis not present

## 2017-07-04 DIAGNOSIS — I10 Essential (primary) hypertension: Secondary | ICD-10-CM | POA: Insufficient documentation

## 2017-07-04 DIAGNOSIS — E7800 Pure hypercholesterolemia, unspecified: Secondary | ICD-10-CM | POA: Diagnosis not present

## 2017-07-04 LAB — I-STAT CHEM 8, ED
BUN: 17 mg/dL (ref 6–20)
CALCIUM ION: 1.15 mmol/L (ref 1.15–1.40)
CHLORIDE: 103 mmol/L (ref 101–111)
CREATININE: 1 mg/dL (ref 0.61–1.24)
GLUCOSE: 141 mg/dL — AB (ref 65–99)
HCT: 51 % (ref 39.0–52.0)
Hemoglobin: 17.3 g/dL — ABNORMAL HIGH (ref 13.0–17.0)
Potassium: 3.9 mmol/L (ref 3.5–5.1)
Sodium: 140 mmol/L (ref 135–145)
TCO2: 27 mmol/L (ref 22–32)

## 2017-07-04 MED ORDER — TRAMADOL HCL 50 MG PO TABS
50.0000 mg | ORAL_TABLET | Freq: Once | ORAL | Status: AC
  Administered 2017-07-04: 50 mg via ORAL
  Filled 2017-07-04: qty 1

## 2017-07-04 MED ORDER — TRAMADOL HCL 50 MG PO TABS: 50.0000 mg | ORAL_TABLET | Freq: Four times a day (QID) | ORAL | 0 refills | Status: DC | PRN

## 2017-07-04 MED ORDER — PREDNISONE 20 MG PO TABS: 40.0000 mg | ORAL_TABLET | Freq: Every day | ORAL | 0 refills | Status: DC

## 2017-07-04 NOTE — ED Triage Notes (Signed)
Left knee pain /swelling since yesterday. Denies any injury.

## 2017-07-04 NOTE — ED Provider Notes (Signed)
[facility] EMERGENCY DEPARTMENT Provider Note   CSN: [id number] Arrival date & time: 07/04/17  1047     History   Chief Complaint Chief Complaint  Patient presents with  . Knee Pain    HPI Kevin Frank is a 77 y.o. male.  HPI  Patient presents with concern of pain in his left knee. Pain began yesterday without clear precipitant, including memorable trauma. Since onset he has had pain focally in the medial joint line. Pain is sore, sharp, worse with attempts at ambulation or flexion. No new fever, chills, or other complaints. Pain is transiently improved with OTC medication. Patient has multiple medical issues, and notably had arthrocentesis of the contralateral knee 1 month ago. He was informed that he had inflammatory findings, no evidence for septic arthritis, nor gout. However, the patient does have a history of gout, has taken allopurinol since he was a youth. He does not, however, remember a specific joint requiring arthrocentesis during that diagnosis time. Patient is here with his wife who assists with the HPI.  In addition to the current complaint, the patient notes that 2 months ago he had left lower extremity erythema, drainage following minor trauma, and was diagnosed with cellulitis. He states that he is swelling and erythema has not entirely resolved, though it is substantially better, and that he has completed all therapy for that illness.   Past Medical History:  Diagnosis Date  . AAA (abdominal aortic aneurysm) ([place]) 01/2008   3.2cm  . BPH (benign prostatic hyperplasia)   . Chronic knee pain    bilateral  . COPD (chronic obstructive pulmonary disease) ([place])   . Diabetes mellitus    type II  . Gout   . History of kidney stones   . Hypercholesterolemia   . Hyperlipidemia   . Hypertension   . Normal nuclear stress test 2011  . Obesity   . Obstructive sleep apnea on CPAP   . Presence of indwelling urinary catheter   . Shortness of breath    due  to cigarette abuse  . Tobacco abuse     Patient Active Problem List   Diagnosis Date Noted  . Obstructive sleep apnea on CPAP 04/23/2017  . Chronic knee pain 04/23/2017  . AKI (acute kidney injury) ([place]) 04/23/2017  . Chronic diastolic CHF (congestive heart failure) ([place]) 04/23/2017  . Cellulitis 04/23/2017  . Cellulitis of left lower leg   . Smoking greater than 40 pack years 03/05/2016  . Incidental lung nodule, > 72mm and < 19mm 03/05/2016  . Stage 2 moderate COPD by GOLD classification ([place]) 03/05/2016  . AAA (abdominal aortic aneurysm) without rupture ([place]) 06/14/2015  . OBSTRUCTIVE SLEEP APNEA 06/09/2007  . HYPERCHOLESTEROLEMIA 05/06/2007  . GOUT 05/06/2007  . DEPRESSION 05/06/2007  . Hypertension, essential 05/06/2007  . ABDOMINAL AORTIC ANEURYSM 05/06/2007  . BENIGN PROSTATIC HYPERTROPHY, HX OF 05/06/2007    Past Surgical History:  Procedure Laterality Date  . ABDOMINAL AORTIC ENDOVASCULAR FENESTRATED STENT GRAFT N/A 04/09/2016   Procedure: ABDOMINAL AORTIC ENDOVASCULAR FENESTRATED STENT GRAFT with left brachial artery access using ultrasound guidance;  Surgeon: Serafina Mitchell, MD;  Location: [facility];  Service: Vascular;  Laterality: N/A;  . [place]   [place]  . PERIPHERAL VASCULAR CATHETERIZATION N/A 04/10/2016   Procedure: Renal Angiography;  Surgeon: Angelia Mould, MD;  Location: [place];  Service: Cardiovascular;  Laterality: N/A;  . TRANSURETHRAL RESECTION OF PROSTATE  01/07/2011   Procedure: TRANSURETHRAL RESECTION OF THE PROSTATE (TURP);  Surgeon: Marissa Nestle;  Location: [facility];  Service: Urology;  Laterality: N/A;       Home Medications    Prior to Admission medications   Medication Sig Start Date End Date Taking? Authorizing Provider  albuterol (PROVENTIL HFA;VENTOLIN HFA) 108 (90 BASE) MCG/ACT inhaler Inhale 2 puffs into the lungs every 6 (six) hours as needed (rescue inhaler).     [provider]  allopurinol  (ZYLOPRIM) 300 MG tablet Take 300 mg by mouth daily.      [provider]  amLODipine (NORVASC) 10 MG tablet Take 1 tablet (10 mg total) by mouth daily. 03/11/17   Sherren Mocha, MD  atorvastatin (LIPITOR) 20 MG tablet Take 20 mg by mouth daily.  10/13/13   [provider]  azelastine (ASTELIN) 0.1 % nasal spray Place 2 sprays into both nostrils 2 (two) times daily. 04/14/16   Rhyne, Hulen Shouts, PA-C  baclofen (LIORESAL) 10 MG tablet Take 1 tablet by mouth 2 (two) times daily. 04/21/17   [provider]  budesonide-formoterol (SYMBICORT) 160-4.5 MCG/ACT inhaler Inhale 2 puffs into the lungs 2 (two) times daily. 11/27/15   Gean Quint, MD  clindamycin (CLEOCIN) 300 MG capsule Take 1 capsule (300 mg total) by mouth every 6 (six) hours. 04/12/17   Evalee Jefferson, PA-C  clopidogrel (PLAVIX) 75 MG tablet TAKE (1) TABLET BY MOUTH ONCE DAILY. 03/04/17   Waynetta Sandy, MD  doxycycline (VIBRA-TABS) 100 MG tablet Take 1 tablet by mouth 2 (two) times daily. 04/17/17   [provider]  furosemide (LASIX) 40 MG tablet Take 40 mg by mouth daily.      [provider]  hydrALAZINE (APRESOLINE) 25 MG tablet Take 1 tablet (25 mg total) by mouth 2 (two) times daily. 03/18/17 06/16/17  Burtis Junes, NP  losartan (COZAAR) 100 MG tablet Take 1 tablet (100 mg total) by mouth daily. 02/09/17 05/10/17  Burtis Junes, NP  metFORMIN (GLUCOPHAGE) 500 MG tablet Take 500 mg by mouth 2 (two) times daily with a meal.  05/31/16   [provider]  metoprolol succinate (TOPROL-XL) 25 MG 24 hr tablet Take 1 tablet (25 mg total) by mouth daily. 03/11/17   Sherren Mocha, MD  sertraline (ZOLOFT) 50 MG tablet Take 50 mg by mouth daily.      [provider]  SPIRIVA HANDIHALER 18 MCG inhalation capsule Place 18 mcg into inhaler and inhale daily.  10/28/10   [provider]    Family History Family History  Problem Relation Age of Onset  . Liver  disease Father   . Lymphoma Mother   . Diabetes Mother   . Hypertension Mother   . Cancer Mother   . Mental retardation Unknown        sibling -- from birth     Social History Social History   Tobacco Use  . Smoking status: Current Every Day Smoker    Packs/day: 2.00    Years: 50.00    Pack years: 100.00    Types: Cigarettes  . Smokeless tobacco: Never Used  . Tobacco comment: 2ppd 10/30/2016 ee  Substance Use Topics  . Alcohol use: Yes    Alcohol/week: 0.0 oz    Comment: occasional  . Drug use: No     Allergies   No known allergies   Review of Systems Review of Systems  Constitutional:       Per HPI, otherwise negative  HENT:       Per HPI, otherwise negative  Respiratory:       Per HPI, otherwise negative  Cardiovascular:       Per HPI, otherwise negative  Gastrointestinal: Negative for vomiting.  Endocrine:       Negative aside from HPI  Genitourinary:       Neg aside from HPI   Musculoskeletal:       Per HPI, otherwise negative  Skin: Positive for color change.  Neurological: Negative for syncope.     Physical Exam Updated Vital Signs BP 125/75 (BP Location: Left Arm)   Pulse (!) 58   Temp 98 F (36.7 C) (Oral)   Resp 18   Wt 100.7 kg (222 lb)   SpO2 98%   BMI 31.85 kg/m   Physical Exam  Constitutional: He is oriented to person, place, and time. He appears well-developed. No distress.  HENT:  Head: Normocephalic and atraumatic.  Eyes: Conjunctivae and EOM are normal.  Cardiovascular: Normal rate, regular rhythm and intact distal pulses.  Pulmonary/Chest: Effort normal. No respiratory distress.  Abdominal: He exhibits no distension.  Musculoskeletal: He exhibits no edema.       Right knee: Normal.       Left knee: He exhibits decreased range of motion and effusion.       Legs: Neurological: He is alert and oriented to person, place, and time.  Skin: Skin is warm and dry.  Psychiatric: He has a normal mood and affect.  Nursing note and  vitals reviewed.    ED Treatments / Results  Labs (all labs ordered are listed, but only abnormal results are displayed) Labs Reviewed  I-STAT CHEM 8, ED - Abnormal; Notable for the following components:      Result Value   Glucose, Bld 141 (*)    Hemoglobin 17.3 (*)    All other components within normal limits   Radiology Dg Knee Complete 4 Views Left  Result Date: 07/04/2017 CLINICAL DATA:  Left knee pain and swelling since yesterday. EXAM: LEFT KNEE - COMPLETE 4+ VIEW COMPARISON:  None. FINDINGS: No evidence of fracture, dislocation, or joint effusion. There is narrowing of the femoral tibial joint space. 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 Chronic deformity of the proximal fibula is identified. Soft tissues are unremarkable. IMPRESSION: No acute fracture or dislocation. Degenerative joint changes of left knee. Electronically Signed   By: Abelardo Diesel M.D.   On: 07/04/2017 14:33    Procedures Procedures (including critical care time)  Medications Ordered in ED Medications  traMADol (ULTRAM) tablet 50 mg (not administered)     Initial Impression / Assessment and Plan / ED Course  I have reviewed the triage vital signs and the nursing notes.  Pertinent labs & imaging results that were available during my care of the patient were reviewed by me and considered in my medical decision making (see chart for details).  On repeat exam the patient is awake and alert, no distress per I discussed all findings with the patient and his wife. The patient has prior infection, there is no current evidence for septic arthritis, with no fever, no substantial lab abnormalities, no appreciable effusion. The patient's specific joint line tenderness suggests soft tissue/arthritis/chondrocalcinosis is likely etiology. Patient has an orthopedist with whom he will follow-up in the coming days.  The patient started on analgesia, anti-inflammatory, discharged in stable condition.  Final Clinical  Impressions(s) / ED Diagnoses  Knee pain, left, initial encounter   Carmin Muskrat, MD 07/04/17 1559

## 2017-07-04 NOTE — Discharge Instructions (Signed)
As discussed, your evaluation today has been largely reassuring.  But, it is important that you monitor your condition carefully, and do not hesitate to return to the ED if you develop new, or concerning changes in your condition. ? ?Otherwise, please follow-up with your physician for appropriate ongoing care. ? ?

## 2017-07-14 ENCOUNTER — Encounter: Payer: Self-pay | Admitting: Orthopedic Surgery

## 2017-07-14 ENCOUNTER — Other Ambulatory Visit (HOSPITAL_COMMUNITY): Payer: Self-pay | Admitting: Pulmonary Disease

## 2017-07-14 ENCOUNTER — Ambulatory Visit: Payer: Medicare HMO | Admitting: Orthopedic Surgery

## 2017-07-14 VITALS — BP 143/83 | HR 67 | Ht 70.0 in | Wt 234.0 lb

## 2017-07-14 DIAGNOSIS — I714 Abdominal aortic aneurysm, without rupture: Secondary | ICD-10-CM | POA: Diagnosis not present

## 2017-07-14 DIAGNOSIS — F17200 Nicotine dependence, unspecified, uncomplicated: Secondary | ICD-10-CM | POA: Diagnosis not present

## 2017-07-14 DIAGNOSIS — L03116 Cellulitis of left lower limb: Secondary | ICD-10-CM | POA: Diagnosis not present

## 2017-07-14 DIAGNOSIS — G4733 Obstructive sleep apnea (adult) (pediatric): Secondary | ICD-10-CM | POA: Diagnosis not present

## 2017-07-14 DIAGNOSIS — J449 Chronic obstructive pulmonary disease, unspecified: Secondary | ICD-10-CM | POA: Diagnosis not present

## 2017-07-14 DIAGNOSIS — R2243 Localized swelling, mass and lump, lower limb, bilateral: Secondary | ICD-10-CM

## 2017-07-14 MED ORDER — SULFAMETHOXAZOLE-TRIMETHOPRIM 800-160 MG PO TABS: 1.0000 | ORAL_TABLET | Freq: Two times a day (BID) | ORAL | 1 refills | Status: DC

## 2017-07-14 NOTE — Patient Instructions (Addendum)
STOP TAKING CLINDAMYCIN/CLEOCIN  START BACTRIM DS TWICE A DAY   KEEP LEGS ELEVATED

## 2017-07-14 NOTE — Progress Notes (Signed)
Progress Note - NEW PROBLEM   Patient ID: Kevin Frank, male   DOB: 05/10/1941, 77 y.o.   MRN: [id number]  Chief Complaint  Patient presents with  . Knee Pain    left patient states he had cellulitis leg left, thinks knee has infection     77 year old male presents after following up in the ER for recurrent pain and swelling erythema left leg.  He is diabetic.  He was previously treated with IV antibiotics for left leg cellulitis  He also had some left knee pain at that time which has subsequently resolved.  He does not have any associated fever or swelling of the knee but he does have swelling of his lower leg again.  Current timeframe is approximately 10 days     Review of Systems  Constitutional: Negative for chills and fever.  Neurological: Negative for tingling.   Current Meds  Medication Sig  . albuterol (PROVENTIL HFA;VENTOLIN HFA) 108 (90 BASE) MCG/ACT inhaler Inhale 2 puffs into the lungs every 6 (six) hours as needed (rescue inhaler).   [name] allopurinol (ZYLOPRIM) 300 MG tablet Take 300 mg by mouth daily.    [name] amLODipine (NORVASC) 10 MG tablet Take 1 tablet (10 mg total) by mouth daily.  [name] atorvastatin (LIPITOR) 20 MG tablet Take 20 mg by mouth daily.   [name] azelastine (ASTELIN) 0.1 % nasal spray Place 2 sprays into both nostrils 2 (two) times daily.  . budesonide-formoterol (SYMBICORT) 160-4.5 MCG/ACT inhaler Inhale 2 puffs into the lungs 2 (two) times daily.  . clindamycin (CLEOCIN) 300 MG capsule Take 1 capsule (300 mg total) by mouth every 6 (six) hours.  . clopidogrel (PLAVIX) 75 MG tablet TAKE (1) TABLET BY MOUTH ONCE DAILY.  . furosemide (LASIX) 40 MG tablet Take 40 mg by mouth daily.    . hydrALAZINE (APRESOLINE) 25 MG tablet Take 1 tablet (25 mg total) by mouth 2 (two) times daily.  [name] losartan (COZAAR) 100 MG tablet Take 1 tablet (100 mg total) by mouth daily.  . metFORMIN (GLUCOPHAGE) 500 MG tablet Take 500 mg by mouth 2 (two) times daily with a meal.   .  metoprolol succinate (TOPROL-XL) 25 MG 24 hr tablet Take 1 tablet (25 mg total) by mouth daily.  . sertraline (ZOLOFT) 50 MG tablet Take 50 mg by mouth daily.    [name] SPIRIVA HANDIHALER 18 MCG inhalation capsule Place 18 mcg into inhaler and inhale daily.    Current Facility-Administered Medications for the 07/14/17 encounter (Office Visit) with Carole Civil, MD  Medication  . ipratropium (ATROVENT) nebulizer solution 0.5 mg  . levalbuterol (XOPENEX) nebulizer solution 1.25 mg    Past Medical History:  Diagnosis Date  . AAA (abdominal aortic aneurysm) ([place]) 01/2008   3.2cm  . BPH (benign prostatic hyperplasia)   . Chronic knee pain    bilateral  . COPD (chronic obstructive pulmonary disease) ([place])   . Diabetes mellitus    type II  . Gout   . History of kidney stones   . Hypercholesterolemia   . Hyperlipidemia   . Hypertension   . Normal nuclear stress test 2011  . Obesity   . Obstructive sleep apnea on CPAP   . Presence of indwelling urinary catheter   . Shortness of breath    due to cigarette abuse  . Tobacco abuse      Allergies  Allergen Reactions  . No Known Allergies     BP (!) 143/83   Pulse 67   Ht  5\' 10"  (1.778 m)   Wt 234 lb (106.1 kg)   BMI 33.58 kg/m    Physical Exam  Constitutional: He is oriented to person, place, and time. He appears well-developed and well-nourished.  Vital signs have been reviewed and are stable. Gen. appearance the patient is well-developed and well-nourished with normal grooming and hygiene.   Musculoskeletal:       Legs: Gait is relatively normal  Neurological: He is alert and oriented to person, place, and time.  Skin: Skin is warm and dry. No erythema.  Psychiatric: He has a normal mood and affect.  Vitals reviewed.   Ortho Exam   Medical decision-making  Imaging:  X-ray left knee shows medial compartment joint space narrowing no effusion 4 views were obtained  Report read as negative for any acute  process   Encounter Diagnosis  Name Primary?  . Cellulitis of leg, left Yes   Meds ordered this encounter  Medications  . sulfamethoxazole-trimethoprim (BACTRIM DS,SEPTRA DS) 800-160 MG tablet    Sig: Take 1 tablet by mouth 2 (two) times daily.    Dispense:  60 tablet    Refill:  1      Meds ordered this encounter  Medications  . sulfamethoxazole-trimethoprim (BACTRIM DS,SEPTRA DS) 800-160 MG tablet    Sig: Take 1 tablet by mouth 2 (two) times daily.    Dispense:  60 tablet    Refill:  1   Return 2 weeks    Arther Abbott, MD 07/14/2017 10:20 AM

## 2017-07-16 ENCOUNTER — Ambulatory Visit (HOSPITAL_COMMUNITY)
Admission: RE | Admit: 2017-07-16 | Discharge: 2017-07-16 | Disposition: A | Discharge status: Home / Self Care | Payer: Medicare HMO | Source: Ambulatory Visit | Attending: Pulmonary Disease | Admitting: Pulmonary Disease

## 2017-07-16 DIAGNOSIS — R600 Localized edema: Secondary | ICD-10-CM | POA: Insufficient documentation

## 2017-07-16 DIAGNOSIS — R2243 Localized swelling, mass and lump, lower limb, bilateral: Secondary | ICD-10-CM | POA: Diagnosis present

## 2017-07-17 ENCOUNTER — Ambulatory Visit (HOSPITAL_COMMUNITY)
Admission: RE | Admit: 2017-07-17 | Discharge: 2017-07-17 | Disposition: A | Discharge status: Home / Self Care | Payer: Medicare HMO | Source: Ambulatory Visit | Attending: Pulmonary Disease | Admitting: Pulmonary Disease

## 2017-07-17 DIAGNOSIS — M7989 Other specified soft tissue disorders: Secondary | ICD-10-CM | POA: Insufficient documentation

## 2017-07-17 DIAGNOSIS — I70203 Unspecified atherosclerosis of native arteries of extremities, bilateral legs: Secondary | ICD-10-CM | POA: Insufficient documentation

## 2017-07-17 DIAGNOSIS — I7090 Unspecified atherosclerosis: Secondary | ICD-10-CM | POA: Diagnosis not present

## 2017-07-17 DIAGNOSIS — R2243 Localized swelling, mass and lump, lower limb, bilateral: Secondary | ICD-10-CM

## 2017-07-27 ENCOUNTER — Ambulatory Visit: Payer: Medicare HMO | Admitting: Surgery

## 2017-07-27 ENCOUNTER — Ambulatory Visit (INDEPENDENT_AMBULATORY_CARE_PROVIDER_SITE_OTHER)
Admission: RE | Admit: 2017-07-27 | Discharge: 2017-07-27 | Disposition: A | Discharge status: Home / Self Care | Payer: Medicare HMO | Source: Ambulatory Visit | Attending: Surgery | Admitting: Surgery

## 2017-07-27 ENCOUNTER — Ambulatory Visit (HOSPITAL_COMMUNITY)
Admission: RE | Admit: 2017-07-27 | Discharge: 2017-07-27 | Disposition: A | Discharge status: Home / Self Care | Payer: Medicare HMO | Source: Ambulatory Visit | Attending: Surgery | Admitting: Surgery

## 2017-07-27 ENCOUNTER — Encounter: Payer: Self-pay | Admitting: Surgery

## 2017-07-27 VITALS — BP 129/73 | HR 57 | Resp 18 | Ht 70.0 in | Wt 233.0 lb

## 2017-07-27 DIAGNOSIS — I701 Atherosclerosis of renal artery: Secondary | ICD-10-CM | POA: Insufficient documentation

## 2017-07-27 DIAGNOSIS — I714 Abdominal aortic aneurysm, without rupture, unspecified: Secondary | ICD-10-CM

## 2017-07-27 DIAGNOSIS — Z9889 Other specified postprocedural states: Secondary | ICD-10-CM | POA: Diagnosis not present

## 2017-07-27 DIAGNOSIS — Z95820 Peripheral vascular angioplasty status with implants and grafts: Secondary | ICD-10-CM | POA: Insufficient documentation

## 2017-07-27 DIAGNOSIS — Z6833 Body mass index (BMI) 33.0-33.9, adult: Secondary | ICD-10-CM | POA: Insufficient documentation

## 2017-07-27 DIAGNOSIS — E669 Obesity, unspecified: Secondary | ICD-10-CM | POA: Diagnosis not present

## 2017-07-27 NOTE — Progress Notes (Signed)
[facility]  Patient name: Kevin Frank MRN: [id number] DOB: 1940-11-06 Sex: male   REASON FOR VISIT:    Follow up  [name]:    Kevin Frank a 77 y.o.malereturns today for his first postoperative follow-up. On 04/09/2016 he underwent FEVARwith a single rightrenal fenestration. I also performed left renal angioplasty at the time of the main body deployment service to ensure that the graft not cover the renal artery because of angulation. The day following his procedure, his creatinine increased and ultrasound did not show good perfusion to the left kidney and therefore he went to the Cath Lab and had stenting of his left renal artery. His creatinine trended down and his urine output improved. The patient also had issues with left leg numbness in his legs after the procedure. Neurology was consulted. It was felt this was most likely secondary to lower back disease that was aggravated during surgery.  He has been dealing with an infection in his left leg which is getting better.  He does not have any open wounds.  PAST MEDICAL HISTORY:   Past Medical History:  Diagnosis Date  . AAA (abdominal aortic aneurysm) ([place]) 01/2008   3.2cm  . BPH (benign prostatic hyperplasia)   . Chronic knee pain    bilateral  . COPD (chronic obstructive pulmonary disease) ([place])   . Diabetes mellitus    type II  . Gout   . History of kidney stones   . Hypercholesterolemia   . Hyperlipidemia   . Hypertension   . Normal nuclear stress test 2011  . Obesity   . Obstructive sleep apnea on CPAP   . Presence of indwelling urinary catheter   . Shortness of breath    due to cigarette abuse  . Tobacco abuse      FAMILY HISTORY:   Family History  Problem Relation Age of Onset  . Liver disease Father   . Lymphoma Mother   . Diabetes Mother   . Hypertension Mother   . Cancer Mother   . Mental  retardation Unknown        sibling -- from birth     SOCIAL HISTORY:   Social History   Tobacco Use  . Smoking status: Current Every Day Smoker    Packs/day: 2.00    Years: 50.00    Pack years: 100.00    Types: Cigarettes  . Smokeless tobacco: Never Used  . Tobacco comment: 2ppd 10/30/2016 ee  Substance Use Topics  . Alcohol use: Yes    Alcohol/week: 0.0 oz    Comment: occasional     ALLERGIES:   Allergies  Allergen Reactions  . No Known Allergies      CURRENT MEDICATIONS:   Current Outpatient Medications  Medication Sig Dispense Refill  . albuterol (PROVENTIL HFA;VENTOLIN HFA) 108 (90 BASE) MCG/ACT inhaler Inhale 2 puffs into the lungs every 6 (six) hours as needed (rescue inhaler).     [name] allopurinol (ZYLOPRIM) 300 MG tablet Take 300 mg by mouth daily.      [name] amLODipine (NORVASC) 10 MG tablet Take 1 tablet (10 mg total) by mouth daily. 90 tablet 3  . atorvastatin (LIPITOR) 20 MG tablet Take 20 mg by mouth daily.     [name] azelastine (ASTELIN) 0.1 % nasal spray Place 2 sprays into both nostrils 2 (two) times daily.    . budesonide-formoterol (SYMBICORT) 160-4.5 MCG/ACT inhaler Inhale 2 puffs into the lungs 2 (two) times daily. 3 Inhaler  1  . clindamycin (CLEOCIN) 300 MG capsule Take 1 capsule (300 mg total) by mouth every 6 (six) hours. 30 capsule 0  . clopidogrel (PLAVIX) 75 MG tablet TAKE (1) TABLET BY MOUTH ONCE DAILY. 30 tablet 0  . Dextromethorphan-Guaifenesin (MUCINEX DM MAXIMUM STRENGTH) 60-1200 MG TB12 Take 2 tablets by mouth daily as needed (cough and congestion).    [name] doxycycline (VIBRA-TABS) 100 MG tablet Take 1 tablet by mouth 2 (two) times daily.    . furosemide (LASIX) 40 MG tablet Take 40 mg by mouth daily.      . hydrALAZINE (APRESOLINE) 25 MG tablet Take 1 tablet (25 mg total) by mouth 2 (two) times daily. 60 tablet 6  . losartan (COZAAR) 100 MG tablet Take 1 tablet (100 mg total) by mouth daily. 90 tablet 3  . metFORMIN (GLUCOPHAGE) 500 MG tablet Take 500  mg by mouth 2 (two) times daily with a meal.     . metoprolol succinate (TOPROL-XL) 25 MG 24 hr tablet Take 1 tablet (25 mg total) by mouth daily. 90 tablet 3  . sertraline (ZOLOFT) 50 MG tablet Take 50 mg by mouth daily.      [name] SPIRIVA HANDIHALER 18 MCG inhalation capsule Place 18 mcg into inhaler and inhale daily.     [name] sulfamethoxazole-trimethoprim (BACTRIM DS,SEPTRA DS) 800-160 MG tablet Take 1 tablet by mouth 2 (two) times daily. 60 tablet 1   Current Facility-Administered Medications  Medication Dose Route Frequency Provider Last Rate Last Dose  . ipratropium (ATROVENT) nebulizer solution 0.5 mg  0.5 mg Nebulization Once Gean Quint, MD      . levalbuterol Penne Lash) nebulizer solution 1.25 mg  1.25 mg Nebulization Once Gean Quint, MD        REVIEW OF SYSTEMS:   [X]  denotes positive finding, [ ]  denotes negative finding Cardiac  Comments:  Chest pain or chest pressure:    Shortness of breath upon exertion:    Short of breath when lying flat:    Irregular heart rhythm:        Vascular    Pain in calf, thigh, or hip brought on by ambulation:    Pain in feet at night that wakes you up from your sleep:     Blood clot in your veins:    Leg swelling:         Pulmonary    Oxygen at home:    Productive cough:     Wheezing:         Neurologic    Sudden weakness in arms or legs:     Sudden numbness in arms or legs:     Sudden onset of difficulty speaking or slurred speech:    Temporary loss of vision in one eye:     Problems with dizziness:         Gastrointestinal    Blood in stool:     Vomited blood:         Genitourinary    Burning when urinating:     Blood in urine:        Psychiatric    Major depression:         Hematologic    Bleeding problems:    Problems with blood clotting too easily:        Skin    Rashes or ulcers:        Constitutional    Fever or chills:      PHYSICAL EXAM:   Vitals:   07/27/17 0955  BP: 129/73  Pulse: (!) 57    Resp: 18  SpO2: 93%  Weight: 233 lb (105.7 kg)  Height: 5\' 10"  (1.778 m)    GENERAL: The patient is a well-nourished male, in no acute distress. The vital signs are documented above. CARDIAC: There is a regular rate and rhythm.  VASCULAR: No carotid bruits PULMONARY: Non-labored respirations ABDOMEN: Soft and non-tender with normal pitched bowel sounds.  MUSCULOSKELETAL: There are no major deformities or cyanosis. NEUROLOGIC: No focal weakness or paresthesias are detected. SKIN: There are no ulcers or rashes noted. PSYCHIATRIC: The patient has a normal affect.  STUDIES:   I have ordered and reviewed his vascular lab studies with the following findings  AAA: Maximum diameter continues to decrease.  Today it measures 4.0.  Previously it was 4.5  Renal duplex: Less than 60% stenosis bilaterally  MEDICAL ISSUES:   AAA:  S/p FEVAR.  No concerns identified by duplex today.  He will follow-up in 6 months with repeat studies.    Annamarie Major, MD [facility] [phone] Pager [phone]

## 2017-07-29 ENCOUNTER — Ambulatory Visit: Payer: Medicare HMO | Admitting: Orthopedic Surgery

## 2017-07-29 VITALS — BP 117/78 | HR 72 | Ht 70.0 in | Wt 233.0 lb

## 2017-07-29 DIAGNOSIS — L03116 Cellulitis of left lower limb: Secondary | ICD-10-CM

## 2017-07-29 NOTE — Patient Instructions (Signed)
Stop DOXYCYCLINE

## 2017-07-29 NOTE — Progress Notes (Signed)
Chief Complaint  Patient presents with  . Follow-up    Recheck on left leg cellulitis.    77 year old male treated for cellulitis from mostly poor venous system on his left leg switched him over to doxycycline now has no tenderness Just discoloration of the skin  No tenderness palpation of the left leg  No swelling in the left knee  Recommend 2-week follow-up stop doxycycline for now

## 2017-08-04 NOTE — Addendum Note (Signed)
Addended by: Lianne Cure A on: 08/04/2017 04:11 PM   Modules accepted: Orders

## 2017-08-07 DIAGNOSIS — R040 Epistaxis: Secondary | ICD-10-CM | POA: Diagnosis not present

## 2017-08-07 DIAGNOSIS — I10 Essential (primary) hypertension: Secondary | ICD-10-CM | POA: Diagnosis not present

## 2017-08-07 DIAGNOSIS — G4733 Obstructive sleep apnea (adult) (pediatric): Secondary | ICD-10-CM | POA: Diagnosis not present

## 2017-08-07 DIAGNOSIS — J449 Chronic obstructive pulmonary disease, unspecified: Secondary | ICD-10-CM | POA: Diagnosis not present

## 2017-08-14 ENCOUNTER — Ambulatory Visit: Payer: Medicare HMO | Admitting: Orthopedic Surgery

## 2017-08-14 VITALS — BP 132/76 | HR 73 | Ht 70.0 in | Wt 233.0 lb

## 2017-08-14 DIAGNOSIS — L03116 Cellulitis of left lower limb: Secondary | ICD-10-CM

## 2017-08-14 NOTE — Progress Notes (Signed)
Follow-up visit cellulitis left leg  Chief Complaint  Patient presents with  . Follow-up    Recheck on left leg.    History 77 year old male multiple bouts of cellulitis off antibiotics now for 2 weeks has no complaints  His leg shows some chronic redness but no erythema tenderness or signs of infection or cellulitis  Encounter Diagnosis  Name Primary?  . Cellulitis of left lower leg Yes    He will follow-up as needed if he starts to have symptoms of recurrent cellulitis he to call the office and will call him in some medication and then have him come in for [place] to look at it

## 2017-08-24 NOTE — Addendum Note (Signed)
Encounter addended by: Nani Ravens, RVT on: 08/24/2017 8:23 AM  Actions taken: Imaging Exam ended

## 2017-08-25 ENCOUNTER — Ambulatory Visit: Payer: Medicare HMO | Admitting: Allergy & Immunology

## 2017-08-25 ENCOUNTER — Encounter: Payer: Self-pay | Admitting: Allergy & Immunology

## 2017-08-25 VITALS — BP 128/72 | HR 61 | Resp 17 | Ht 69.29 in | Wt 233.8 lb

## 2017-08-25 DIAGNOSIS — J449 Chronic obstructive pulmonary disease, unspecified: Secondary | ICD-10-CM | POA: Diagnosis not present

## 2017-08-25 DIAGNOSIS — J310 Chronic rhinitis: Secondary | ICD-10-CM

## 2017-08-25 NOTE — Progress Notes (Signed)
FOLLOW UP  Date of Service/Encounter:  08/25/17   Assessment:   Asthma-COPD overlap syndrome  Chronic rhinitis  Current smoker (three packs per day)  Bilateral cerumen impaction  Epistaxis x 1 month (left-sided)  Complex medical history  Unclear compliance history  Plan/Recommendations:   1. Asthma-COPD overlap syndrome - Lung function looked much worse than last time, and it did NOT improve with albuterol use. - We will try to treat this with a prednisone burst (prednisone dose pack provided).  - I agree with the change to Trelegy, as I have had successes with that in other patients. - We could consider the addition of an anti-eosinophil monthly injection to see if that can provide some improvement (information on [place] provided). - While you use up your Symbicort, use a spacer each time to distribute the medication deeper into your lungs where it is needed most.  - Spacer sample and teaching provided.  - Daily controller medication(s): Trelegy 100/62.5/25 one puff once daily - Prior to physical activity: ProAir 2 puffs 10-15 minutes before physical activity. - Rescue medications: ProAir 4 puffs every 4-6 hours as needed or albuterol nebulizer one vial every 4-6 hours as needed - Asthma control goals:  * Full participation in all desired activities (may need albuterol before activity) * Albuterol use two time or less a week on average (not counting use with activity) * Cough interfering with sleep two time or less a month * Oral steroids no more than once a year * No hospitalizations  2. Chronic rhinitis - We will check your blood for new environmental allergies. - We will call you in 1-2 weeks with the results. - Start a daily antihistamine to help with drying of your nasal drainage (Zyrtec, Allegra, and Xyzal samples provided).  - In the interim, start nasal saline rinses 1-2 times daily to try to heal your nose and prevent more bleeds. - I look  forward to seeing what Dr. Benjamine Mola recommends. - You did have wax in both of your ears, therefore ask Dr. Benjamine Mola to clean these out.   3. Obstructive sleep apnea - I would talk to Dr. Luan Pulling about your CPAP machine since we do not manage these.  - I would also talk to him about sleeping pills.   4. Return in about 4 weeks (around 09/22/2017).  Subjective:   Kevin Frank is a 77 y.o. male presenting today for follow up of  Chief Complaint  Patient presents with  . Asthma    [name] has a history of the following: Patient Active Problem List   Diagnosis Date Noted  . Obstructive sleep apnea on CPAP 04/23/2017  . Chronic knee pain 04/23/2017  . AKI (acute kidney injury) ([place]) 04/23/2017  . Chronic diastolic CHF (congestive heart failure) ([place]) 04/23/2017  . Cellulitis 04/23/2017  . Cellulitis of left lower leg   . Smoking greater than 40 pack years 03/05/2016  . Incidental lung nodule, > 20mm and < 34mm 03/05/2016  . Stage 2 moderate COPD by GOLD classification ([place]) 03/05/2016  . AAA (abdominal aortic aneurysm) without rupture ([place]) 06/14/2015  . OBSTRUCTIVE SLEEP APNEA 06/09/2007  . HYPERCHOLESTEROLEMIA 05/06/2007  . GOUT 05/06/2007  . DEPRESSION 05/06/2007  . Hypertension, essential 05/06/2007  . ABDOMINAL AORTIC ANEURYSM 05/06/2007  . BENIGN PROSTATIC HYPERTROPHY, HX OF 05/06/2007    History obtained from: chart review and patient and his wife of 54 years.  Kevin Frank's Primary Care Provider is Sinda Du, MD.  Kevin Frank is a 77 y.o. male presenting for a follow up visit. He was last seen in April 2017 by Dr. Ishmael Holter. At that time, he was actually doing fairly well on Symbicort two puffs BID and Spiriva. Lung function revealed FEV1 and FVC in the 85-90% range. He was also continued on azelastine nasal spray BID as needed. Smoking cessation was again discussed. Medication compliance has been an issue, per review of the previous notes.   Since the last visit,  he has progressively declined in his lung function. He does have progressively worse shortness of breath per his wife, although he does not feel that he is any worse off. Wife reports that he continues to smoke and is now at three packs per day. He has not really tried quitting. His wife recently changed to e-cigs. He has tried using chewing tobacco but did not stick with this. He has had an absolute eosinophil count of 200 around in November 2018.   Asthma/Respiratory Symptom History: He has been on Symbicort 160/4.5 two puffs twice daily. He does not use a spacer. He puts the medication directly in his mouth. He is on the Spiriva as well. He is changing to Trelegy once he is done with the Symbicort two puffs twice daily. He never uses his rescue inhaler. He does not feel that he needs to. He continues to smoke three packs per day.   Allergic Rhinitis Symptom History: He does have nasal congestion for three months. He does endorse a bloody nose, which led him to stop using the nasal spray. He does not use nasal saline rinses. He sees no difference in the nose bleeding. He has been referred to Dr. Benjamine Mola and he has an appointment this coming week. He was on allergy shots to ragweed when he was very young. He does not take a daily antihistamine.   Otherwise, there have been no changes to his past medical history, surgical history, family history, or social history.    Review of Systems: a 14-point review of systems is pertinent for what is mentioned in HPI.  Otherwise, all other systems were negative. Constitutional: negative other than that listed in the HPI Eyes: negative other than that listed in the HPI Ears, nose, mouth, throat, and face: negative other than that listed in the HPI Respiratory: negative other than that listed in the HPI Cardiovascular: negative other than that listed in the HPI Gastrointestinal: negative other than that listed in the HPI Genitourinary: negative other than that  listed in the HPI Integument: negative other than that listed in the HPI Hematologic: negative other than that listed in the HPI Musculoskeletal: negative other than that listed in the HPI Neurological: negative other than that listed in the HPI Allergy/Immunologic: negative other than that listed in the HPI    Objective:   Blood pressure 128/72, pulse 61, resp. rate 17, height 5' 9.29" (1.76 m), weight 233 lb 12.8 oz (106.1 kg), SpO2 95 %. Body mass index is 34.24 kg/m.   Physical Exam:  General: Alert, interactive, in no acute distress. Talkative and very friendly male.  Eyes: No conjunctival injection bilaterally, no discharge on the right, no discharge on the left and no Horner-Trantas dots present. PERRL bilaterally. EOMI without pain. No photophobia.  Ears: Cerumen present bilaterally, Right TM pearly gray with normal light reflex, Left TM pearly gray with normal light reflex, Right TM intact without perforation and Left TM intact without perforation.  Nose/Throat: External nose within normal limits and septum midline. Turbinates  markedly edematous and pale with clear discharge. Posterior oropharynx erythematous without cobblestoning in the posterior oropharynx. Tonsils 2+ without exudates.  Tongue without thrush. There is one eschar within the left nasal cavity.  Lungs: Decreased breath sounds bilaterally without wheezing, rhonchi or rales. No increased work of breathing. CV: Normal S1/S2. No murmurs. Capillary refill <2 seconds.  Skin: Warm and dry, without lesions or rashes. Neuro:   Grossly intact. No focal deficits appreciated. Responsive to questions.  Diagnostic studies:   Spirometry: results abnormal (FEV1: 1.57/59%, FVC: 2.91/72%, FEV1/FVC: 54%).    Spirometry consistent with moderate obstructive disease. Albuterol/Atrovent nebulizer treatment given in clinic with improvement in FEV1, but not significant per ATS criteria. The FVC actually decreased while the FEV1  increased, which I suppose is consistent with less airway trapping.   Allergy Studies: none      Salvatore Marvel, MD [facility]

## 2017-08-25 NOTE — Patient Instructions (Addendum)
1. Asthma-COPD overlap syndrome - Lung function looked much worse than last time, and it did NOT improve with albuterol use. - We will try to treat this with a prednisone burst (prednisone dose pack provided).  - I agree with the change to Trelegy, as I have had successes with that in other patients. - We could consider the addition of an anti-eosinophil monthly injection to see if that can provide some improvement (information on [place] provided). - While you use up your Symbicort, use a spacer each time to distribute the medication deeper into your lungs where it is needed most.  - Daily controller medication(s): Trelegy 100/62.5/25 one puff once daily - Prior to physical activity: ProAir 2 puffs 10-15 minutes before physical activity. - Rescue medications: ProAir 4 puffs every 4-6 hours as needed or albuterol nebulizer one vial every 4-6 hours as needed - Asthma control goals:  * Full participation in all desired activities (may need albuterol before activity) * Albuterol use two time or less a week on average (not counting use with activity) * Cough interfering with sleep two time or less a month * Oral steroids no more than once a year * No hospitalizations  2. Chronic rhinitis - We will check your blood for new environmental allergies. - We will call you in 1-2 weeks with the results. - Start a daily antihistamine to help with drying of your nasal drainage (Zyrtec, Allegra, and Xyzal samples provided).  - In the interim, start nasal saline rinses 1-2 times daily to try to heal your nose and prevent more bleeds. - I look forward to seeing what Dr. Benjamine Mola recommends. - You did have wax in both of your ears, therefore ask Dr. Benjamine Mola to clean these out.   3. Obstructive sleep apnea - I would talk to Dr. Luan Pulling about your CPAP machine since we do not manage these.  - I would also talk to him about sleeping pills.   4. Return in about 4 weeks (around 09/22/2017).   Please inform  us of any Emergency Department visits, hospitalizations, or changes in symptoms. Call us before going to the ED for breathing or allergy symptoms since we might be able to fit you in for a sick visit. Feel free to contact us anytime with any questions, problems, or concerns.  It was a pleasure to meet you and your family today! Thank you for the stories about [facility]! :-)   Websites that have reliable patient information: 1. American Academy of Asthma, Allergy, and Immunology: www.aaaai.org 2. Food Allergy Research and Education (FARE): foodallergy.org 3. Mothers of Asthmatics: http://www.asthmacommunitynetwork.org 4. American College of Allergy, Asthma, and Immunology: www.acaai.org

## 2017-08-27 ENCOUNTER — Ambulatory Visit (INDEPENDENT_AMBULATORY_CARE_PROVIDER_SITE_OTHER): Payer: Medicare HMO | Admitting: Otolaryngology

## 2017-08-27 DIAGNOSIS — R040 Epistaxis: Secondary | ICD-10-CM | POA: Diagnosis not present

## 2017-08-28 DIAGNOSIS — I10 Essential (primary) hypertension: Secondary | ICD-10-CM | POA: Diagnosis not present

## 2017-08-28 DIAGNOSIS — F17200 Nicotine dependence, unspecified, uncomplicated: Secondary | ICD-10-CM | POA: Diagnosis not present

## 2017-08-28 DIAGNOSIS — J449 Chronic obstructive pulmonary disease, unspecified: Secondary | ICD-10-CM | POA: Diagnosis not present

## 2017-08-28 DIAGNOSIS — G4733 Obstructive sleep apnea (adult) (pediatric): Secondary | ICD-10-CM | POA: Diagnosis not present

## 2017-08-29 LAB — IGE+ALLERGENS ZONE 2(30)
Alternaria Alternata IgE: <0.1 kU/L
Amer Sycamore IgE Qn: 0.14 kU/L — AB
Aspergillus Fumigatus IgE: <0.1 kU/L
Bermuda Grass IgE: <0.1 kU/L
Cedar, Mountain IgE: 0.15 kU/L — AB
Cladosporium Herbarum IgE: <0.1 kU/L
Cockroach, American IgE: 0.18 kU/L — AB
D Farinae IgE: <0.1 kU/L
Dog Dander IgE: 0.19 kU/L — AB
E001-IGE CAT DANDER: 0.21 kU/L — AB
Hickory, White IgE: <0.1 kU/L
IGE (IMMUNOGLOBULIN E), SERUM: 758 [IU]/mL — AB (ref 6–495)
MUGWORT IGE QN: 0.43 kU/L — AB
Maple/Box Elder IgE: 0.18 kU/L — AB
Mucor Racemosus IgE: <0.1 kU/L
Nettle IgE: <0.1 kU/L
Oak, White IgE: <0.1 kU/L
Penicillium Chrysogen IgE: <0.1 kU/L
Plantain, English IgE: 0.17 kU/L — AB
Ragweed, Short IgE: 10 kU/L — AB
Sheep Sorrel IgE Qn: 0.16 kU/L — AB
Sweet gum IgE RAST Ql: <0.1 kU/L
T003-IGE COMMON SILVER BIRCH: 0.13 kU/L — AB
T008-IGE ELM, AMERICAN: 0.1 kU/L — AB

## 2017-09-04 ENCOUNTER — Encounter: Payer: Self-pay | Admitting: Nurse Practitioner

## 2017-09-07 DIAGNOSIS — G4733 Obstructive sleep apnea (adult) (pediatric): Secondary | ICD-10-CM | POA: Diagnosis not present

## 2017-09-07 DIAGNOSIS — I714 Abdominal aortic aneurysm, without rupture: Secondary | ICD-10-CM | POA: Diagnosis not present

## 2017-09-07 DIAGNOSIS — R269 Unspecified abnormalities of gait and mobility: Secondary | ICD-10-CM | POA: Diagnosis not present

## 2017-09-14 ENCOUNTER — Ambulatory Visit: Payer: Medicare HMO | Admitting: Nurse Practitioner

## 2017-09-14 ENCOUNTER — Encounter: Payer: Self-pay | Admitting: Nurse Practitioner

## 2017-09-14 VITALS — BP 140/80 | HR 65 | Ht 70.0 in | Wt 236.0 lb

## 2017-09-14 DIAGNOSIS — E7800 Pure hypercholesterolemia, unspecified: Secondary | ICD-10-CM | POA: Diagnosis not present

## 2017-09-14 DIAGNOSIS — F17200 Nicotine dependence, unspecified, uncomplicated: Secondary | ICD-10-CM

## 2017-09-14 DIAGNOSIS — I10 Essential (primary) hypertension: Secondary | ICD-10-CM

## 2017-09-14 NOTE — Progress Notes (Signed)
CARDIOLOGY OFFICE NOTE  Date:  09/14/2017    Kevin Frank Date of Birth: 1941-01-08 Medical Record #[id number]  PCP:  Sinda Du, MD  Cardiologist:  Jerel Shepherd    Chief Complaint  Patient presents with  . Hypertension    7 month check - seen for Dr. Burt Knack    History of Present Illness: Kevin Frank is a 77 y.o. male who presents today for a follow up visit. Seen for Dr. Burt Knack. He is a former patient of Dr. Susa Simmonds.   He has a history of hypertension, hyperlipidemia, DM, tobacco abuse, COPD, and abdominal aortic aneurysm.   On 04/09/2016 he underwent FEVARwith a single rightrenal fenestration by Dr. Trula Slade. He also had left renal angioplasty at the time of the main body deployment service to ensure that the graft not cover the renal artery because of angulation. The day following his procedure, his creatinine increased and ultrasound did not show good perfusion to the left kidney and therefore he went to the Cath Lab and had stenting of his left renal artery. His creatinine trended down and his urine output improved. The patient also had issues with left leg numbness in his legs after the procedure. Neurology was consulted. It was felt this was most likely secondary to lower back disease that was aggravated during surgery.   He continues to smoke cigarettes. He has not had any known CAD.   I have seen him over the past several years - he is not interested in stopping smoking. Meds have been adjusted for his BP. Hydralazine was added at last visit. He has had some stress with legal issues over the last few times seen.   Comes in today. Here alone. Says he is doing ok. Still dealing with some legal issues. Still "loves" his cigarettes - no plans to stop until "my toes are turned up". Has had some nose bleeding - has seen ENT - may have to go back. BP has been around 140 at home but only checking rarely at home. No medicines yet today. He has had several  outpatient visits over the past few months - BP good at those visits. No chest pain. Says his breathing is ok. Remains pretty active. He really has no concerns today.   Past Medical History:  Diagnosis Date  . AAA (abdominal aortic aneurysm) ([place]) 01/2008   3.2cm  . BPH (benign prostatic hyperplasia)   . Chronic knee pain    bilateral  . COPD (chronic obstructive pulmonary disease) ([place])   . Diabetes mellitus    type II  . Gout   . History of kidney stones   . Hypercholesterolemia   . Hyperlipidemia   . Hypertension   . Normal nuclear stress test 2011  . Obesity   . Obstructive sleep apnea on CPAP   . Presence of indwelling urinary catheter   . Shortness of breath    due to cigarette abuse  . Tobacco abuse     Past Surgical History:  Procedure Laterality Date  . ABDOMINAL AORTIC ENDOVASCULAR FENESTRATED STENT GRAFT N/A 04/09/2016   Procedure: ABDOMINAL AORTIC ENDOVASCULAR FENESTRATED STENT GRAFT with left brachial artery access using ultrasound guidance;  Surgeon: Serafina Mitchell, MD;  Location: [facility];  Service: Vascular;  Laterality: N/A;  . [place]  . PERIPHERAL VASCULAR CATHETERIZATION N/A 04/10/2016   Procedure: Renal Angiography;  Surgeon: Angelia Mould, MD;  Location: [facility];  Service: Cardiovascular;  Laterality: N/A;  . TRANSURETHRAL RESECTION OF PROSTATE  01/07/2011   Procedure: TRANSURETHRAL RESECTION OF THE PROSTATE (TURP);  Surgeon: Marissa Nestle;  Location: [facility];  Service: Urology;  Laterality: N/A;     Medications: Current Meds  Medication Sig  . albuterol (PROVENTIL HFA;VENTOLIN HFA) 108 (90 BASE) MCG/ACT inhaler Inhale 2 puffs into the lungs every 6 (six) hours as needed (rescue inhaler).   [name] allopurinol (ZYLOPRIM) 300 MG tablet Take 300 mg by mouth daily.    [name] amLODipine (NORVASC) 10 MG tablet Take 1 tablet (10 mg total) by mouth daily.  [name] atorvastatin (LIPITOR) 20 MG tablet Take 20 mg by mouth daily.   [name]  azelastine (ASTELIN) 0.1 % nasal spray Place 2 sprays into both nostrils 2 (two) times daily.  . budesonide-formoterol (SYMBICORT) 160-4.5 MCG/ACT inhaler Inhale 2 puffs into the lungs 2 (two) times daily.  . clopidogrel (PLAVIX) 75 MG tablet TAKE (1) TABLET BY MOUTH ONCE DAILY.  [name] Dextromethorphan-Guaifenesin (MUCINEX DM MAXIMUM STRENGTH) 60-1200 MG TB12 Take 2 tablets by mouth daily as needed (cough and congestion).  . furosemide (LASIX) 40 MG tablet Take 40 mg by mouth daily.    . hydrALAZINE (APRESOLINE) 25 MG tablet Take 1 tablet (25 mg total) by mouth 2 (two) times daily.  [name] losartan (COZAAR) 100 MG tablet Take 1 tablet (100 mg total) by mouth daily.  . metFORMIN (GLUCOPHAGE) 500 MG tablet Take 500 mg by mouth 2 (two) times daily with a meal.   . metoprolol succinate (TOPROL-XL) 25 MG 24 hr tablet Take 1 tablet (25 mg total) by mouth daily.  . sertraline (ZOLOFT) 50 MG tablet Take 50 mg by mouth daily.    [name] SPIRIVA HANDIHALER 18 MCG inhalation capsule Place 18 mcg into inhaler and inhale daily.    Current Facility-Administered Medications for the 09/14/17 encounter (Office Visit) with Burtis Junes, NP  Medication  . ipratropium (ATROVENT) nebulizer solution 0.5 mg  . levalbuterol (XOPENEX) nebulizer solution 1.25 mg     Allergies: Allergies  Allergen Reactions  . No Known Allergies     Social History: The patient  reports that he has been smoking cigarettes.  He has a 100.00 pack-year smoking history. He has never used smokeless tobacco. He reports that he drinks alcohol. He reports that he does not use drugs.   Family History: The patient's family history includes Cancer in his mother; Diabetes in his mother; Hypertension in his mother; Liver disease in his father; Lymphoma in his mother; Mental retardation in his unknown relative.   Review of Systems: Please see the history of present illness.   Otherwise, the review of systems is positive for none.   All other systems are  reviewed and negative.   Physical Exam: VS:  BP 140/80   Pulse 65   Ht 5\' 10"  (1.778 m)   Wt 236 lb (107 kg)   SpO2 95%   BMI 33.86 kg/m  .  BMI Body mass index is 33.86 kg/m.  Wt Readings from Last 3 Encounters:  09/14/17 236 lb (107 kg)  08/25/17 233 lb 12.8 oz (106.1 kg)  08/14/17 233 lb (105.7 kg)    General: Pleasant. Elderly male. Obese.  Alert and in no acute distress.   HEENT: Normal.  Neck: Supple, no JVD, carotid bruits, or masses noted.  Cardiac: Regular rate and rhythm. Heart tones are distant. No edema.  Respiratory:  Lungs are coarse - few wheezes but with normal work of breathing.  GI: Soft and  nontender.  MS: No deformity or atrophy. Gait and ROM intact.  Skin: Warm and dry. Color is normal.  Neuro:  Strength and sensation are intact and no gross focal deficits noted.  Psych: Alert, appropriate and with normal affect.   LABORATORY DATA:  EKG:  EKG is not ordered today.  Lab Results  Component Value Date   WBC 9.6 04/24/2017   HGB 17.3 (H) 07/04/2017   HCT 51.0 07/04/2017   PLT 199 04/24/2017   GLUCOSE 141 (H) 07/04/2017   CHOL 138 12/16/2010   TRIG 53.0 12/16/2010   HDL 46.10 12/16/2010   LDLCALC 81 12/16/2010   ALT 23 04/02/2016   AST 18 04/02/2016   NA 140 07/04/2017   K 3.9 07/04/2017   CL 103 07/04/2017   CREATININE 1.00 07/04/2017   BUN 17 07/04/2017   CO2 26 04/24/2017   INR 1.08 04/09/2016   HGBA1C 6.0 (H) 04/02/2016     BNP (last 3 results) No results for input(s): BNP in the last 8760 hours.  ProBNP (last 3 results) No results for input(s): PROBNP in the last 8760 hours.   Other Studies Reviewed Today:   Myoview Study Highlights from 02/2016   Nuclear stress EF: 58%.  There was no ST segment deviation noted during stress.  The study is normal.  This is a low risk study.    Echo Study Conclusions from 02/2016  - Left ventricle: The cavity size was normal. Wall thickness was increased in a pattern of mild  LVH. Systolic function was normal. The estimated ejection fraction was in the range of 60% to 65%. Wall motion was normal; there were no regional wall motion abnormalities. Features are consistent with a pseudonormal left ventricular filling pattern, with concomitant abnormal relaxation and increased filling pressure (grade 2 diastolic dysfunction). Doppler parameters are consistent with high ventricular filling pressure. - Mitral valve: There was mild regurgitation. - Left atrium: The atrium was moderately dilated. - Pulmonary arteries: Systolic pressure was mildly increased. PA peak pressure: 37 mm Hg (S).  Impressions:  - Normal LV systolic function; grade 2 diastolic dysfunction with elevated LV filling pressure; mild MR; moderate LAE; trace TR with mildly elevated pulmonary pressure.  CT ANGIO CHEST/ABD/PELVIS IMPRESSION: 1. Patent suprarenal bifurcated aortic stent graft without endoleak, native sac diameter 5.3 cm. 2. Coronary and Aortic Atherosclerosis (ICD10-170.0) without dissection. 3. Descending and sigmoid diverticulosis.   Electronically Signed By: Lucrezia Europe M.D. On: 05/16/2016 11:02    Assessment/Plan:  1.HTN - BP is fair - looking thru his chart he has had good outpatient control. No changes made today. Encouraged him to check some at home.   2. HLD - on statin - his labs are checked by his PCP.    3. Long standing tobacco - not interested in stopping.   4. Prior AAA/FEVAR repair with stenting of the left renal artery - followed by [facility].   5. Epistaxis - asked him to check his BP some - seeing ENT as well.    Current medicines are reviewed with the patient today.  The patient does not have concerns regarding medicines other than what has been noted above.  The following changes have been made:  See above.  Labs/ tests ordered today include:   No orders of the defined types were placed in this  encounter.    Disposition:   FU with me in 6 months.   Patient is agreeable to this plan and will call if any problems develop in the interim.  SignedTruitt Merle, NP  09/14/2017 8:32 AM  [facility] [address] [place], [place]  [address] Phone: [phone] Fax: [phone]

## 2017-09-14 NOTE — Patient Instructions (Addendum)
We will be checking the following labs today - NONE   Medication Instructions:    Continue with your current medicines.     Testing/Procedures To Be Arranged:  N/A  Follow-Up:   See me in 6 months on 03/17/18 at 8:00 AM    Other Special Instructions:   N/A    If you need a refill on your cardiac medications before your next appointment, please call your pharmacy.   Call the [place] office at [phone] if you have any questions, problems or concerns.

## 2017-09-15 ENCOUNTER — Ambulatory Visit: Payer: Medicare HMO | Admitting: Orthopedic Surgery

## 2017-09-18 ENCOUNTER — Ambulatory Visit: Payer: Medicare HMO | Admitting: Orthopedic Surgery

## 2017-09-18 ENCOUNTER — Encounter: Payer: Self-pay | Admitting: Orthopedic Surgery

## 2017-09-18 VITALS — BP 146/87 | HR 69 | Ht 70.0 in | Wt 238.0 lb

## 2017-09-18 DIAGNOSIS — M25561 Pain in right knee: Secondary | ICD-10-CM

## 2017-09-18 NOTE — Progress Notes (Signed)
Progress Note   Patient ID: Kevin Frank, male   DOB: July 08, 1940, 77 y.o.   MRN: [id number]  Chief Complaint  Patient presents with  . Knee Pain    right knee pain, has had recent flare lasting 3 days     Kevin Frank is 77 years old I gave him an injection in his knee several months ago.  He comes in with a history of 3 days of acute onset of right knee pain with no associated trauma.  Pain was initially along the medial joint line and then subsided and residual pain was noted in the back of his knee he reports no swelling catching locking or giving way    Review of Systems  Constitutional: Negative for chills and fever.  Skin: Negative.   Neurological: Negative for tingling.   No outpatient medications have been marked as taking for the 09/18/17 encounter (Office Visit) with Carole Civil, MD.   Current Facility-Administered Medications for the 09/18/17 encounter (Office Visit) with Carole Civil, MD  Medication  . ipratropium (ATROVENT) nebulizer solution 0.5 mg  . levalbuterol (XOPENEX) nebulizer solution 1.25 mg    Allergies  Allergen Reactions  . No Known Allergies      BP (!) 146/87   Pulse 69   Ht 5\' 10"  (1.778 m)   Wt 238 lb (108 kg)   BMI 34.15 kg/m   Physical Exam  Constitutional: He is oriented to person, place, and time. He appears well-developed and well-nourished.  Vital signs have been reviewed and are stable. Gen. appearance the patient is well-developed and well-nourished with normal grooming and hygiene.   Musculoskeletal:       Legs: Neurological: He is alert and oriented to person, place, and time.  Skin: Skin is warm and dry. No erythema.  Psychiatric: He has a normal mood and affect.  Vitals reviewed.    Medical decision-making Encounter Diagnosis  Name Primary?  . Acute pain of right knee Yes   Procedure note right knee injection verbal consent was obtained to inject right knee joint  Timeout was completed to confirm the  site of injection  The medications used were 40 mg of Depo-Medrol and 1% lidocaine 3 cc  Anesthesia was provided by ethyl chloride and the skin was prepped with alcohol.  After cleaning the skin with alcohol a 20-gauge needle was used to inject the right knee joint. There were no complications. A sterile bandage was applied.    Arther Abbott, MD 09/18/2017 9:42 AM

## 2017-09-22 ENCOUNTER — Encounter: Payer: Self-pay | Admitting: Allergy & Immunology

## 2017-09-22 ENCOUNTER — Ambulatory Visit: Payer: Medicare HMO | Admitting: Allergy & Immunology

## 2017-09-22 VITALS — BP 138/82 | HR 62 | Resp 18

## 2017-09-22 DIAGNOSIS — J302 Other seasonal allergic rhinitis: Secondary | ICD-10-CM

## 2017-09-22 DIAGNOSIS — J449 Chronic obstructive pulmonary disease, unspecified: Secondary | ICD-10-CM | POA: Diagnosis not present

## 2017-09-22 DIAGNOSIS — J4489 Other specified chronic obstructive pulmonary disease: Secondary | ICD-10-CM | POA: Insufficient documentation

## 2017-09-22 DIAGNOSIS — J3089 Other allergic rhinitis: Secondary | ICD-10-CM | POA: Diagnosis not present

## 2017-09-22 NOTE — Progress Notes (Signed)
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 Asthma-COPD overlap syndrome - Lung function looked the same as last time.  - While you use up your Symbicort, use a spacer each time to distribute the medication deeper into your lungs where it is needed most.  - We will plan to start Xolair, so expect Kevin Frank to reach out to you about coverage.   - The mechanism of action was discussed in detail.  - Daily controller medication(s): Trelegy 100/62.5/25 one puff once daily - Prior to physical activity: ProAir 2 puffs 10-15 minutes before physical activity. - Rescue medications: ProAir 4 puffs every 4-6 hours as needed or albuterol nebulizer one vial every 4-6 hours as needed - Asthma control goals:  * Full participation in all desired activities (may need albuterol before activity) * Albuterol use two time or less a week on average (not counting use with activity) * Cough interfering with sleep two time or less a month * Oral steroids no more than once a year * No hospitalizations  2. Chronic rhinitis (cat, dog, cockroach, trees, weeds, and ragweed) - Continue with nasal saline rinses 1-2 times daily to try to heal your nose and prevent more bleeds. - Use an antihistamine as needed (Allegra, Zyrtec, Claritin).   3. Return in about 4 months (around 01/22/2018).   Subjective:   Kevin Frank is a 77 y.o. male presenting today for follow up of  Chief Complaint  Patient presents with  . Asthma  . Cough    Worse in the morning    Kevin Frank has a history of the following: Patient Active Problem List   Diagnosis Date Noted  . Seasonal and perennial allergic rhinitis 09/22/2017  .  Asthma-COPD overlap syndrome (Kevin Frank) 09/22/2017  . Obstructive sleep apnea on CPAP 04/23/2017  . Chronic knee pain 04/23/2017  . AKI (acute kidney injury) (Kevin Frank) 04/23/2017  . Chronic diastolic CHF (congestive heart failure) (Kevin Frank) 04/23/2017  . Cellulitis 04/23/2017  . Cellulitis of left lower leg   . Smoking greater than 40 pack years 03/05/2016  . Incidental lung nodule, > 60mm and < 84mm 03/05/2016  . Stage 2 moderate COPD by GOLD classification (Kevin Frank) 03/05/2016  . AAA (abdominal aortic aneurysm) without rupture (Kevin Frank) 06/14/2015  . OBSTRUCTIVE SLEEP APNEA 06/09/2007  . HYPERCHOLESTEROLEMIA 05/06/2007  . GOUT 05/06/2007  . DEPRESSION 05/06/2007  . Hypertension, essential 05/06/2007  . ABDOMINAL AORTIC ANEURYSM 05/06/2007  . BENIGN PROSTATIC HYPERTROPHY, HX OF 05/06/2007    History obtained from: chart review and patient.  Zenaida Niece Primary Care Provider is Kevin Du, MD.     Kevin Frank is a 77 y.o. male presenting for a follow up visit. We last saw Kevin Frank in March 2019 after a very long hiatus. At that time, his breathing had worsened since the last visit. Spirometry was not responsive to albuterol treatment, although he did feel symptomatically improved. He was already on Symbicort and Spiriva at the last visit, and we changed him to Trelegy to make his regimen a bit easier. 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 We obtained environmental allergy testing via the blood that demonstrated positives to cat, dog, cockroach, trees, weeds,  and ragweed. We started him on nasal saline rinses and avoided other nasal sprays due to a history of epistaxis. We also recommended adding on a daily antihistamine to help dry out his rhinorrhea. He did have bilateral cerumen obstruction and I recommended that he talk to Dr. Benjamine Frank about this.  He also wanted to discuss his OSA and get new sleep apnea equipment, but I reocmmended that he talk to his PCP  about this instead. Smoking was clearly contributing to his symptoms, but he has continued to refuse to stop smoking.   Since the last visit, he has continued to have problems. It is clear that he is not going to stop smoking. It does not seem that he has started Trelegy since the last visit as discussed. He was on Symbicort and Spiriva at the last visit and we changed him to the Trelegy. He does have a history of elevated eosinophils, and we considered initiating an injectable medication such as [place] and Fasenra. We also did blood testing that demonstrated positives to cat, dog, cockroach, trees, weeds, and ragweed. IgE was elevated enough to consider the addition of Xolair.   He continues on the Symbicort and the Spiriva and is using it up before starting the Trelegy. He does report becoming more winded with physical activity. He has never tried using albuterol prior to physical activity, instead saving it for "when [he] really needs it". 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 He apparently has a large variety of buildings on his property that are used for entertainment purposes. He did talk to Dr. Luan Pulling about his CPAP and got new equipment for this. He has needed no prednisone or ED visits since the last visit. He has though more about biologics for his asthma and is open to discussing this today.   Rhinitis is controlled with saline. He is not interested in allergen immunotherapy and is adamant about keeping his dog. We never discussed that he get rid of his dog in the past, but I would never recommend that anyway. The dog does sleep in his room, but not on his bed. Otherwise, there have been no changes to his past medical history, surgical history, family history, or social history.    Review of Systems: a 14-point review of systems is pertinent for what is mentioned in HPI.  Otherwise, all other systems were negative. Constitutional: negative other  than that listed in the HPI Eyes: negative other than that listed in the HPI Ears, nose, mouth, throat, and face: negative other than that listed in the HPI Respiratory: negative other than that listed in the HPI Cardiovascular: negative other than that listed in the HPI Gastrointestinal: negative other than that listed in the HPI Genitourinary: negative other than that listed in the HPI Integument: negative other than that listed in the HPI Hematologic: negative other than that listed in the HPI Musculoskeletal: negative other than that listed in the HPI Neurological: negative other than that listed in the HPI Allergy/Immunologic: negative other than that listed in the HPI    Objective:   Blood pressure 138/82, pulse 62, resp. rate 18, SpO2 95 %. There is no height or weight on file to calculate BMI.   Physical Exam:  General: Alert, interactive, in no acute distress. Smiling and very talkative.  Eyes: No conjunctival injection bilaterally, no discharge on the right, no discharge on the left and no Horner-Trantas dots present. PERRL bilaterally. EOMI without pain. No photophobia.  Ears: Right TM pearly gray with normal light reflex, Left TM pearly gray with normal light reflex, Right TM intact without perforation and Left TM intact without perforation.  Nose/Throat: External nose within normal limits and septum midline. Turbinates edematous and pale with clear discharge. Posterior oropharynx mildly erythematous without cobblestoning in the posterior oropharynx. Tonsils 2+ without exudates.  Tongue without thrush. Lungs: Decreased breath sounds bilaterally without wheezing, rhonchi or rales. No increased work of breathing. CV: Normal S1/S2. No murmurs. Capillary refill <2 seconds.  Skin: Warm and dry, without lesions or rashes. Neuro:   Grossly intact. No focal deficits appreciated. Responsive to questions.  Diagnostic studies:   Spirometry: results abnormal (FEV1: 1.56/59%, FVC:  2.83/70%, FEV1/FVC: 55%).    Spirometry consistent with moderate obstructive disease. Overall, values are unchanged compared to his previous visit. Spirometry was not reversible at the last visit, therefore we did not attempt bronchodilation today.   Allergy Studies: none    Salvatore Marvel, MD [facility]

## 2017-09-22 NOTE — Patient Instructions (Addendum)
1. Asthma-COPD overlap syndrome - Lung function looked the same as last time.  - While you use up your Symbicort, use a spacer each time to distribute the medication deeper into your lungs where it is needed most.  - We will plan to start Xolair, so expect Tammy to reach out to you about coverage.   - This is a monthly  - Daily controller medication(s): Trelegy 100/62.5/25 one puff once daily - Prior to physical activity: ProAir 2 puffs 10-15 minutes before physical activity. - Rescue medications: ProAir 4 puffs every 4-6 hours as needed or albuterol nebulizer one vial every 4-6 hours as needed - Asthma control goals:  * Full participation in all desired activities (may need albuterol before activity) * Albuterol use two time or less a week on average (not counting use with activity) * Cough interfering with sleep two time or less a month * Oral steroids no more than once a year * No hospitalizations  2. Chronic rhinitis (cat, dog, cockroach, trees, weeds, and ragweed) - Continue with nasal saline rinses 1-2 times daily to try to heal your nose and prevent more bleeds. - Use an antihistamine as needed (Allegra, Zyrtec, Claritin).   3. Return in about 4 months (around 01/22/2018).   Please inform us of any Emergency Department visits, hospitalizations, or changes in symptoms. Call us before going to the ED for breathing or allergy symptoms since we might be able to fit you in for a sick visit. Feel free to contact us anytime with any questions, problems, or concerns.  It was a pleasure to see you again today!   Websites that have reliable patient information: 1. American Academy of Asthma, Allergy, and Immunology: www.aaaai.org 2. Food Allergy Research and Education (FARE): foodallergy.org 3. Mothers of Asthmatics: http://www.asthmacommunitynetwork.org 4. American College of Allergy, Asthma, and Immunology: www.acaai.org

## 2017-10-12 ENCOUNTER — Ambulatory Visit (INDEPENDENT_AMBULATORY_CARE_PROVIDER_SITE_OTHER): Payer: Medicare HMO | Admitting: Otolaryngology

## 2017-10-14 DIAGNOSIS — J449 Chronic obstructive pulmonary disease, unspecified: Secondary | ICD-10-CM | POA: Diagnosis not present

## 2017-10-14 DIAGNOSIS — L03116 Cellulitis of left lower limb: Secondary | ICD-10-CM | POA: Diagnosis not present

## 2017-10-14 DIAGNOSIS — G4733 Obstructive sleep apnea (adult) (pediatric): Secondary | ICD-10-CM | POA: Diagnosis not present

## 2017-10-14 DIAGNOSIS — I10 Essential (primary) hypertension: Secondary | ICD-10-CM | POA: Diagnosis not present

## 2017-10-19 ENCOUNTER — Emergency Department (HOSPITAL_COMMUNITY): Payer: Medicare HMO

## 2017-10-19 ENCOUNTER — Encounter (HOSPITAL_COMMUNITY): Payer: Self-pay | Admitting: Emergency Medicine

## 2017-10-19 ENCOUNTER — Other Ambulatory Visit: Payer: Self-pay

## 2017-10-19 ENCOUNTER — Telehealth: Payer: Self-pay | Admitting: Orthopedic Surgery

## 2017-10-19 ENCOUNTER — Emergency Department (HOSPITAL_COMMUNITY)
Admission: EM | Admit: 2017-10-19 | Discharge: 2017-10-19 | Disposition: A | Discharge status: Home / Self Care | Payer: Medicare HMO | Attending: Emergency Medicine | Admitting: Emergency Medicine

## 2017-10-19 DIAGNOSIS — I110 Hypertensive heart disease with heart failure: Secondary | ICD-10-CM | POA: Diagnosis not present

## 2017-10-19 DIAGNOSIS — F17210 Nicotine dependence, cigarettes, uncomplicated: Secondary | ICD-10-CM | POA: Insufficient documentation

## 2017-10-19 DIAGNOSIS — Z7902 Long term (current) use of antithrombotics/antiplatelets: Secondary | ICD-10-CM | POA: Diagnosis not present

## 2017-10-19 DIAGNOSIS — Z7984 Long term (current) use of oral hypoglycemic drugs: Secondary | ICD-10-CM | POA: Diagnosis not present

## 2017-10-19 DIAGNOSIS — J449 Chronic obstructive pulmonary disease, unspecified: Secondary | ICD-10-CM | POA: Insufficient documentation

## 2017-10-19 DIAGNOSIS — L03116 Cellulitis of left lower limb: Secondary | ICD-10-CM | POA: Diagnosis not present

## 2017-10-19 DIAGNOSIS — M25462 Effusion, left knee: Secondary | ICD-10-CM | POA: Insufficient documentation

## 2017-10-19 DIAGNOSIS — M13862 Other specified arthritis, left knee: Secondary | ICD-10-CM | POA: Diagnosis not present

## 2017-10-19 DIAGNOSIS — M7989 Other specified soft tissue disorders: Secondary | ICD-10-CM | POA: Diagnosis not present

## 2017-10-19 DIAGNOSIS — M25562 Pain in left knee: Secondary | ICD-10-CM | POA: Insufficient documentation

## 2017-10-19 DIAGNOSIS — I5032 Chronic diastolic (congestive) heart failure: Secondary | ICD-10-CM | POA: Diagnosis not present

## 2017-10-19 DIAGNOSIS — E119 Type 2 diabetes mellitus without complications: Secondary | ICD-10-CM | POA: Diagnosis not present

## 2017-10-19 DIAGNOSIS — Z79899 Other long term (current) drug therapy: Secondary | ICD-10-CM | POA: Diagnosis not present

## 2017-10-19 LAB — BASIC METABOLIC PANEL
Anion gap: 12 (ref 5–15)
BUN: 16 mg/dL (ref 6–20)
CALCIUM: 9.5 mg/dL (ref 8.9–10.3)
CO2: 25 mmol/L (ref 22–32)
CREATININE: 1.02 mg/dL (ref 0.61–1.24)
Chloride: 101 mmol/L (ref 101–111)
GFR calc Af Amer: >60 mL/min (ref >60)
GLUCOSE: 151 mg/dL — AB (ref 65–99)
POTASSIUM: 4.3 mmol/L (ref 3.5–5.1)
SODIUM: 138 mmol/L (ref 135–145)

## 2017-10-19 LAB — CBC WITH DIFFERENTIAL/PLATELET
Basophils Absolute: 0 10*3/uL (ref 0.0–0.1)
Basophils Relative: 0 %
EOS ABS: 0.1 10*3/uL (ref 0.0–0.7)
EOS PCT: 1 %
HCT: 47.7 % (ref 39.0–52.0)
Hemoglobin: 15.8 g/dL (ref 13.0–17.0)
LYMPHS ABS: 1.8 10*3/uL (ref 0.7–4.0)
Lymphocytes Relative: 13 %
MCH: 32.2 pg (ref 26.0–34.0)
MCHC: 33.1 g/dL (ref 30.0–36.0)
MCV: 97.1 fL (ref 78.0–100.0)
MONO ABS: 1.7 10*3/uL — AB (ref 0.1–1.0)
Monocytes Relative: 12 %
Neutro Abs: 10.4 10*3/uL — ABNORMAL HIGH (ref 1.7–7.7)
Neutrophils Relative %: 74 %
PLATELETS: 165 10*3/uL (ref 150–400)
RBC: 4.91 MIL/uL (ref 4.22–5.81)
RDW: 14.3 % (ref 11.5–15.5)
WBC: 14 10*3/uL — AB (ref 4.0–10.5)

## 2017-10-19 LAB — GRAM STAIN
GRAM STAIN: NONE SEEN
SPECIAL REQUESTS: NORMAL

## 2017-10-19 LAB — URIC ACID: URIC ACID, SERUM: 3.9 mg/dL — AB (ref 4.4–7.6)

## 2017-10-19 LAB — SYNOVIAL CELL COUNT + DIFF, W/ CRYSTALS
EOSINOPHILS-SYNOVIAL: 0 % (ref 0–1)
LYMPHOCYTES-SYNOVIAL FLD: 1 % (ref 0–20)
MONOCYTE-MACROPHAGE-SYNOVIAL FLUID: 14 % — AB (ref 50–90)
Neutrophil, Synovial: 85 % — ABNORMAL HIGH (ref 0–25)
WBC, Synovial: 17040 /mm3 — ABNORMAL HIGH (ref 0–200)

## 2017-10-19 MED ORDER — METHYLPREDNISOLONE ACETATE 80 MG/ML IJ SUSP
40.0000 mg | Freq: Once | INTRAMUSCULAR | Status: AC
  Administered 2017-10-19: 40 mg via INTRA_ARTICULAR
  Filled 2017-10-19: qty 1

## 2017-10-19 MED ORDER — LIDOCAINE HCL (PF) 1 % IJ SOLN
INTRAMUSCULAR | Status: AC
  Filled 2017-10-19: qty 30

## 2017-10-19 MED ORDER — LIDOCAINE HCL (PF) 1 % IJ SOLN: 5.0000 mL | Freq: Once | INTRAMUSCULAR | Status: DC

## 2017-10-19 MED ORDER — LIDOCAINE HCL (PF) 1 % IJ SOLN
30.0000 mL | Freq: Once | INTRAMUSCULAR | Status: AC
  Administered 2017-10-19: 30 mL via INTRADERMAL

## 2017-10-19 NOTE — ED Provider Notes (Signed)
[facility] EMERGENCY DEPARTMENT Provider Note   CSN: [id number] Arrival date & time: 10/19/17  1543     History   Chief Complaint Chief Complaint  Patient presents with  . Joint Swelling    HPI Kevin Frank is a 77 y.o. male.  HPI Patient presents with his wife who assists with the HPI presents with concern of the left knee swelling and pain. He has multiple medical issues, but this is his primary concern. He is here with his wife and a daughter who assist with the HPI.  Patient has ongoing issues with both lower extremities and swelling, and 1 week ago started on antibiotics by his primary care physician. He notes that he has been tolerating this well, with no ongoing fever, nausea, chest pain, dyspnea. However, over the past 2 days, without other clear precipitant he has developed swelling in his left knee Pain is worse with motion, described as sore, severe. Previously patient has had arthrocentesis performed by orthopedic physician, though this was a long time ago.  During this illness, in spite of taking antibiotics, his symptoms have symptoms, and it is unclear if he is taking any other pain medication for relief.     Past Medical History:  Diagnosis Date  . AAA (abdominal aortic aneurysm) ([place]) 01/2008   3.2cm  . Asthma   . BPH (benign prostatic hyperplasia)   . Chronic knee pain    bilateral  . COPD (chronic obstructive pulmonary disease) ([place])   . Diabetes mellitus    type II  . Gout   . History of kidney stones   . Hypercholesterolemia   . Hyperlipidemia   . Hypertension   . Normal nuclear stress test 2011  . Obesity   . Obstructive sleep apnea on CPAP   . Presence of indwelling urinary catheter   . Shortness of breath    due to cigarette abuse  . Tobacco abuse     Patient Active Problem List   Diagnosis Date Noted  . Seasonal and perennial allergic rhinitis 09/22/2017  . Asthma-COPD overlap syndrome ([place]) 09/22/2017  . Obstructive sleep  apnea on CPAP 04/23/2017  . Chronic knee pain 04/23/2017  . AKI (acute kidney injury) ([place]) 04/23/2017  . Chronic diastolic CHF (congestive heart failure) ([place]) 04/23/2017  . Cellulitis 04/23/2017  . Cellulitis of left lower leg   . Smoking greater than 40 pack years 03/05/2016  . Incidental lung nodule, > 108mm and < 72mm 03/05/2016  . Stage 2 moderate COPD by GOLD classification ([place]) 03/05/2016  . AAA (abdominal aortic aneurysm) without rupture ([place]) 06/14/2015  . OBSTRUCTIVE SLEEP APNEA 06/09/2007  . HYPERCHOLESTEROLEMIA 05/06/2007  . GOUT 05/06/2007  . DEPRESSION 05/06/2007  . Hypertension, essential 05/06/2007  . ABDOMINAL AORTIC ANEURYSM 05/06/2007  . BENIGN PROSTATIC HYPERTROPHY, HX OF 05/06/2007    Past Surgical History:  Procedure Laterality Date  . ABDOMINAL AORTIC ENDOVASCULAR FENESTRATED STENT GRAFT N/A 04/09/2016   Procedure: ABDOMINAL AORTIC ENDOVASCULAR FENESTRATED STENT GRAFT with left brachial artery access using ultrasound guidance;  Surgeon: Serafina Mitchell, MD;  Location: [place];  Service: Vascular;  Laterality: N/A;  . [place]   [place]  . PERIPHERAL VASCULAR CATHETERIZATION N/A 04/10/2016   Procedure: Renal Angiography;  Surgeon: Angelia Mould, MD;  Location: [place] [facility];  Service: Cardiovascular;  Laterality: N/A;  . TRANSURETHRAL RESECTION OF PROSTATE  01/07/2011   Procedure: TRANSURETHRAL RESECTION OF THE PROSTATE (TURP);  Surgeon: Marissa Nestle;  Location: [facility];  Service:  Urology;  Laterality: N/A;        Home Medications    Prior to Admission medications   Medication Sig Start Date End Date Taking? Authorizing Provider  albuterol (PROVENTIL HFA;VENTOLIN HFA) 108 (90 BASE) MCG/ACT inhaler Inhale 2 puffs into the lungs every 6 (six) hours as needed (rescue inhaler).     [provider]  allopurinol (ZYLOPRIM) 300 MG tablet Take 300 mg by mouth daily.      [provider]  amLODipine (NORVASC) 10 MG tablet  Take 1 tablet (10 mg total) by mouth daily. 03/11/17   Sherren Mocha, MD  atorvastatin (LIPITOR) 20 MG tablet Take 20 mg by mouth daily.  10/13/13   [provider]  azelastine (ASTELIN) 0.1 % nasal spray Place 2 sprays into both nostrils 2 (two) times daily. 04/14/16   Rhyne, Hulen Shouts, PA-C  budesonide-formoterol (SYMBICORT) 160-4.5 MCG/ACT inhaler Inhale 2 puffs into the lungs 2 (two) times daily. 11/27/15   Gean Quint, MD  clopidogrel (PLAVIX) 75 MG tablet TAKE (1) TABLET BY MOUTH ONCE DAILY. 03/04/17   Waynetta Sandy, MD  Dextromethorphan-Guaifenesin [facility] DM MAXIMUM STRENGTH) 60-1200 MG TB12 Take 2 tablets by mouth daily as needed (cough and congestion).    [provider]  furosemide (LASIX) 40 MG tablet Take 40 mg by mouth daily.      [provider]  hydrALAZINE (APRESOLINE) 25 MG tablet Take 1 tablet (25 mg total) by mouth 2 (two) times daily. 03/18/17 07/04/18  Burtis Junes, NP  losartan (COZAAR) 100 MG tablet Take 1 tablet (100 mg total) by mouth daily. 02/09/17 07/04/18  Burtis Junes, NP  metFORMIN (GLUCOPHAGE) 500 MG tablet Take 500 mg by mouth 2 (two) times daily with a meal.  05/31/16   [provider]  metoprolol succinate (TOPROL-XL) 25 MG 24 hr tablet Take 1 tablet (25 mg total) by mouth daily. 03/11/17   Sherren Mocha, MD  sertraline (ZOLOFT) 50 MG tablet Take 50 mg by mouth daily.      [provider]  SPIRIVA HANDIHALER 18 MCG inhalation capsule Place 18 mcg into inhaler and inhale daily.  10/28/10   [provider]    Family History Family History  Problem Relation Age of Onset  . Liver disease Father   . Lymphoma Mother   . Diabetes Mother   . Hypertension Mother   . Cancer Mother   . Mental retardation Unknown        sibling -- from birth   . Asthma Daughter   . Allergic rhinitis Neg Hx   . Angioedema Neg Hx   . Atopy Neg Hx   . Eczema Neg Hx   . Immunodeficiency Neg Hx     Social  History Social History   Tobacco Use  . Smoking status: Current Every Day Smoker    Packs/day: 2.00    Years: 50.00    Pack years: 100.00    Types: Cigarettes  . Smokeless tobacco: Never Used  . Tobacco comment: 2ppd 10/30/2016 ee  Substance Use Topics  . Alcohol use: Yes    Alcohol/week: 0.0 oz    Comment: occasional  . Drug use: No     Allergies   No known allergies   Review of Systems Review of Systems  Constitutional:       Per HPI, otherwise negative  HENT:       Per HPI, otherwise negative  Respiratory:       Per HPI, otherwise negative  Cardiovascular:       Per HPI, otherwise negative  Gastrointestinal: Negative for vomiting.  Endocrine:       Negative aside from HPI  Genitourinary:       Neg aside from HPI   Musculoskeletal:       Per HPI, otherwise negative  Skin: Negative.   Neurological: Negative for syncope.     Physical Exam Updated Vital Signs BP (!) 154/78   Pulse 74   Temp 98.2 F (36.8 C) (Oral)   Resp 18   Wt 108.9 kg (240 lb)   SpO2 97%   BMI 34.44 kg/m   Physical Exam  Constitutional: He is oriented to person, place, and time. He appears well-developed. No distress.  HENT:  Head: Normocephalic and atraumatic.  Eyes: Conjunctivae and EOM are normal.  Cardiovascular: Normal rate, regular rhythm and intact distal pulses.  Pulmonary/Chest: Effort normal. No stridor. No respiratory distress.  Abdominal: He exhibits no distension.  Musculoskeletal: He exhibits no edema.       Left knee: He exhibits decreased range of motion, swelling and effusion. Tenderness found.       Feet:  Neurological: He is alert and oriented to person, place, and time.  Skin: Skin is warm and dry.  Psychiatric: He has a normal mood and affect.  Nursing note and vitals reviewed.    ED Treatments / Results  Labs (all labs ordered are listed, but only abnormal results are displayed) Labs Reviewed  CBC WITH DIFFERENTIAL/PLATELET - Abnormal; Notable for  the following components:      Result Value   WBC 14.0 (*)    Neutro Abs 10.4 (*)    Monocytes Absolute 1.7 (*)    All other components within normal limits  BASIC METABOLIC PANEL - Abnormal; Notable for the following components:   Glucose, Bld 151 (*)    All other components within normal limits  URIC ACID - Abnormal; Notable for the following components:   Uric Acid, Serum 3.9 (*)    All other components within normal limits  SYNOVIAL CELL COUNT + DIFF, W/ CRYSTALS - Abnormal; Notable for the following components:   Appearance-Synovial CLOUDY (*)    WBC, Synovial 17,040 (*)    Neutrophil, Synovial 85 (*)    Monocyte-Macrophage-Synovial Fluid 14 (*)    All other components within normal limits  CULTURE, BODY FLUID-BOTTLE  GRAM STAIN  GLUCOSE, BODY FLUID OTHER  PROTEIN, BODY FLUID (OTHER)    EKG None  Radiology Dg Knee Complete 4 Views Left  Result Date: 10/19/2017 CLINICAL DATA:  Pain and swelling. EXAM: LEFT KNEE - COMPLETE 4+ VIEW COMPARISON:  None. FINDINGS: Chondrocalcinosis. Moderate suprapatellar effusion. No fractures. Mild degenerative changes. IMPRESSION: 1. Chondrocalcinosis consistent with CPPD. 2. Moderate joint effusion. 3. No other abnormalities. Electronically Signed   By: Dorise Bullion III M.D   On: 10/19/2017 16:19    Procedures .Joint Aspiration/Arthrocentesis Date/Time: 10/19/2017 5:40 PM Performed by: Carmin Muskrat, MD Authorized by: Carmin Muskrat, MD   Consent:    Consent obtained:  Verbal   Consent given by:  Patient   Risks discussed:  Bleeding, incomplete drainage, infection and pain   Alternatives discussed:  No treatment, delayed treatment and alternative treatment Location:    Location:  Knee   Knee:  L knee Anesthesia (see MAR for exact dosages):    Anesthesia method:  Local infiltration   Local anesthetic:  Lidocaine 1% w/o epi Procedure details:    Preparation: Patient was prepped and draped in usual sterile  fashion       Needle gauge:  18 G   Ultrasound guidance: no     Approach:  Lateral   Aspirate amount:  60   Aspirate characteristics:  Yellow   Steroid injected: yes     Specimen collected: yes   Post-procedure details:    Dressing:  Adhesive bandage   Patient tolerance of procedure:  Tolerated well, no immediate complications   (including critical care time)  Medications Ordered in ED Medications  lidocaine (PF) (XYLOCAINE) 1 % injection (has no administration in time range)  methylPREDNISolone acetate (DEPO-MEDROL) injection 40 mg (40 mg Intra-articular Given by Other 10/19/17 1715)  lidocaine (PF) (XYLOCAINE) 1 % injection 30 mL (30 mLs Intradermal Given by Other 10/19/17 1715)     Initial Impression / Assessment and Plan / ED Course  I have reviewed the triage vital signs and the nursing notes.  Pertinent labs & imaging results that were available during my care of the patient were reviewed by me and considered in my medical decision making (see chart for details).    Chart review performed after the initial evaluation notable for orthopedic arthrocentesis procedure 1 year ago, with injection of steroids, lidocaine.  7:30 PM Patient calm, awake, alert, on repeat exam. Initial findings generally reassuring, though he has mild leukocytosis, he also has synovial fluid analysis results consistent with gout, with white count only 17K. Patient is already on allopurinol, notes now that his primary care physician phoned in a steroid prescription to his house, and that is available for him at home. With otherwise reassuring findings, no evidence for septic arthritis, no evidence for bacteremia, sepsis, no distal loss of sensation or function, and after improvement in his condition following arthrocentesis with removal of substantial synovial fluid, the patient was discharged in stable condition.  Final Clinical Impressions(s) / ED Diagnoses  Knee pain, acute, left  Carmin Muskrat, MD 10/19/17  1931

## 2017-10-19 NOTE — Telephone Encounter (Signed)
Take 500 mg tylenol q6 for 3 days

## 2017-10-19 NOTE — Discharge Instructions (Addendum)
Please be sure to obtain and initiate your prescription for steroids.  Return here for concerning changes in your condition.

## 2017-10-19 NOTE — Telephone Encounter (Signed)
Patient came by our office asking for Dr Aline Brochure, regarding pain increasing in left knee.  Relayed Dr Aline Brochure is out of clinic (for the past week).   Routing to clinic staff to advise patient.

## 2017-10-19 NOTE — ED Triage Notes (Signed)
Patient complaining of swelling and pain to left knee x 4 days. States he has had fluid removed from same knee in past.

## 2017-10-20 ENCOUNTER — Telehealth: Payer: Self-pay | Admitting: Orthopedic Surgery

## 2017-10-20 LAB — PROTEIN, BODY FLUID (OTHER): Total Protein, Body Fluid Other: 4 g/dL

## 2017-10-20 LAB — GLUCOSE, BODY FLUID OTHER: Glucose, Body Fluid Other: 122 mg/dL

## 2017-10-20 NOTE — Telephone Encounter (Signed)
Patient called and canceled appointment for Tuesday, 10-27-17.  He states he went to ED and was diagnosed with gout.  He will follow up with his PCP

## 2017-10-20 NOTE — Telephone Encounter (Signed)
He went to the ER and had fluid aspirated, told likely gout flare, PCP gave steroid taper.

## 2017-10-24 LAB — CULTURE, BODY FLUID W GRAM STAIN -BOTTLE
Culture: NO GROWTH
Special Requests: ADEQUATE

## 2017-10-26 ENCOUNTER — Other Ambulatory Visit: Payer: Self-pay | Admitting: Nurse Practitioner

## 2017-10-27 ENCOUNTER — Ambulatory Visit: Payer: Medicare HMO | Admitting: Orthopedic Surgery

## 2017-10-27 ENCOUNTER — Ambulatory Visit (INDEPENDENT_AMBULATORY_CARE_PROVIDER_SITE_OTHER): Payer: Medicare HMO | Admitting: *Deleted

## 2017-10-27 DIAGNOSIS — J4540 Moderate persistent asthma, uncomplicated: Secondary | ICD-10-CM | POA: Diagnosis not present

## 2017-10-27 MED ORDER — OMALIZUMAB 150 MG ~~LOC~~ SOLR
150.0000 mg | SUBCUTANEOUS | Status: DC
  Administered 2017-10-27: 150 mg via SUBCUTANEOUS

## 2017-10-28 MED ORDER — EPINEPHRINE 0.3 MG/0.3ML IJ SOAJ
INTRAMUSCULAR | 1 refills | Status: DC
Start: 2017-10-28 — End: 2018-03-15

## 2017-10-28 MED ORDER — OMALIZUMAB 150 MG ~~LOC~~ SOLR
375.0000 mg | SUBCUTANEOUS | Status: DC
  Administered 2017-11-10 – 2021-01-25 (×71): 375 mg via SUBCUTANEOUS

## 2017-10-28 NOTE — Progress Notes (Signed)
Immunotherapy   Patient Details  Name: Kevin Frank MRN: [id number] Date of Birth: [birth year]  10/27/2017  Kevin Frank started injections for  Xolair 375mg   Frequency:Every 2 Weeks Epi-Pen:Prescription for Epi-Pen given Consent signed and patient instructions given. Patient waited 1 hr after injection without complications    Orlene Erm 10/28/2017, 10:00 AM

## 2017-10-28 NOTE — Addendum Note (Signed)
Addended by: Carin Hock on: 10/28/2017 02:23 PM   Modules accepted: Orders

## 2017-10-29 DIAGNOSIS — M109 Gout, unspecified: Secondary | ICD-10-CM | POA: Diagnosis not present

## 2017-10-29 DIAGNOSIS — M25561 Pain in right knee: Secondary | ICD-10-CM | POA: Diagnosis not present

## 2017-10-29 DIAGNOSIS — J449 Chronic obstructive pulmonary disease, unspecified: Secondary | ICD-10-CM | POA: Diagnosis not present

## 2017-10-29 DIAGNOSIS — I10 Essential (primary) hypertension: Secondary | ICD-10-CM | POA: Diagnosis not present

## 2017-10-29 DIAGNOSIS — M1000 Idiopathic gout, unspecified site: Secondary | ICD-10-CM | POA: Diagnosis not present

## 2017-10-30 ENCOUNTER — Encounter

## 2017-10-30 ENCOUNTER — Ambulatory Visit: Payer: Medicare HMO | Admitting: Orthopedic Surgery

## 2017-11-03 DIAGNOSIS — W57XXXA Bitten or stung by nonvenomous insect and other nonvenomous arthropods, initial encounter: Secondary | ICD-10-CM | POA: Diagnosis not present

## 2017-11-03 DIAGNOSIS — I10 Essential (primary) hypertension: Secondary | ICD-10-CM | POA: Diagnosis not present

## 2017-11-03 DIAGNOSIS — M542 Cervicalgia: Secondary | ICD-10-CM | POA: Diagnosis not present

## 2017-11-03 DIAGNOSIS — J449 Chronic obstructive pulmonary disease, unspecified: Secondary | ICD-10-CM | POA: Diagnosis not present

## 2017-11-04 DIAGNOSIS — M542 Cervicalgia: Secondary | ICD-10-CM | POA: Diagnosis not present

## 2017-11-04 DIAGNOSIS — E1165 Type 2 diabetes mellitus with hyperglycemia: Secondary | ICD-10-CM | POA: Diagnosis not present

## 2017-11-04 DIAGNOSIS — W57XXXA Bitten or stung by nonvenomous insect and other nonvenomous arthropods, initial encounter: Secondary | ICD-10-CM | POA: Diagnosis not present

## 2017-11-04 DIAGNOSIS — I10 Essential (primary) hypertension: Secondary | ICD-10-CM | POA: Diagnosis not present

## 2017-11-10 ENCOUNTER — Ambulatory Visit (INDEPENDENT_AMBULATORY_CARE_PROVIDER_SITE_OTHER): Payer: Medicare HMO

## 2017-11-10 DIAGNOSIS — J4540 Moderate persistent asthma, uncomplicated: Secondary | ICD-10-CM

## 2017-11-13 IMAGING — MR MR LUMBAR SPINE W/O CM
4 of 5 series · 18 of 48 positions shown · non-contrast
Comparison: Prior CT from 01/21/2016.

CLINICAL DATA: Initial evaluation for left leg weakness with
numbness status post aneurysm repair.

EXAM:
MRI LUMBAR SPINE WITHOUT CONTRAST
TECHNIQUE: Multiplanar, multisequence MR imaging of the lumbar spine was
performed. No intravenous contrast was administered.

[Series 4: T2 · sagittal · 4.0mm · 0.55mm/px · 6 of 14 slices shown (1 of 2)]
[im 1/14]
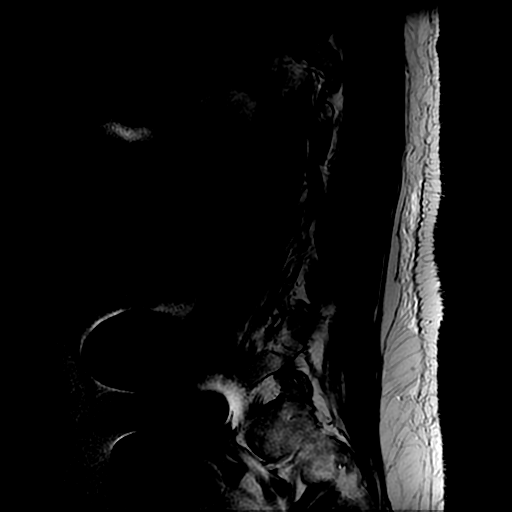
[im 3/14]
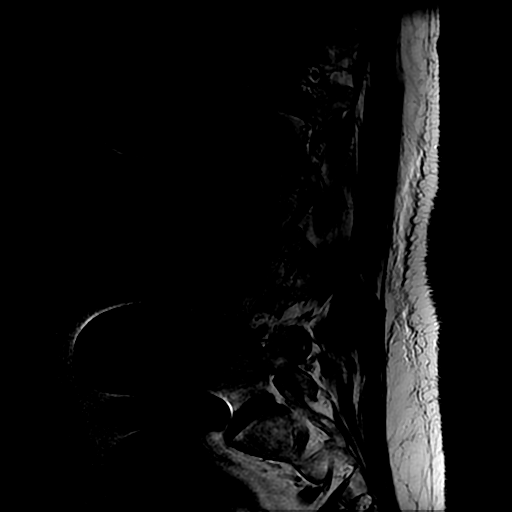
[im 6/14]
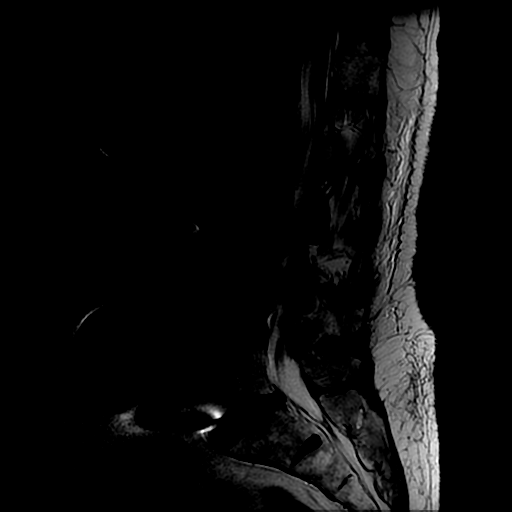
[im 8/14]
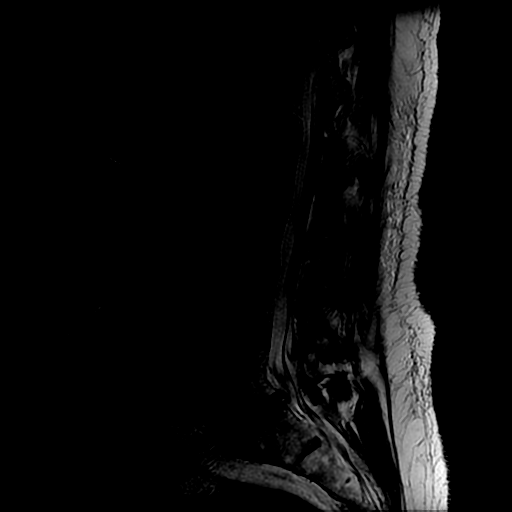
[im 11/14]
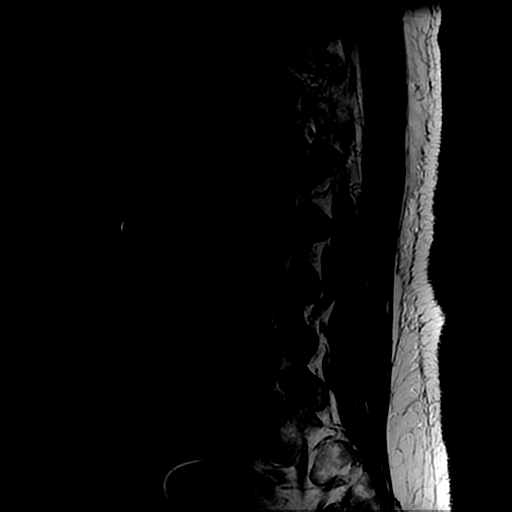
[im 14/14]
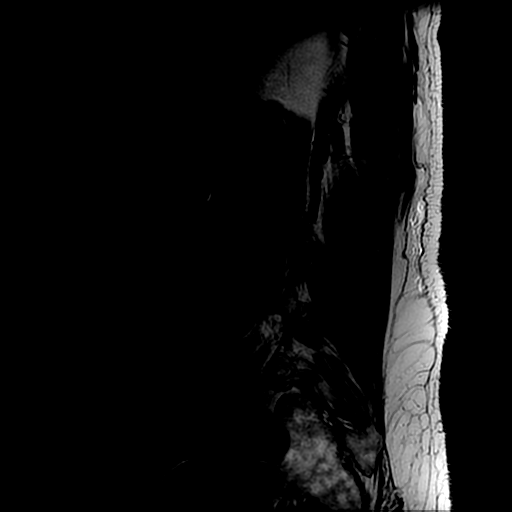

[Series 5: T1 · sagittal · 4.0mm · 0.55mm/px · 3 of 14 slices shown (1 of 2)]
[im 3/14]
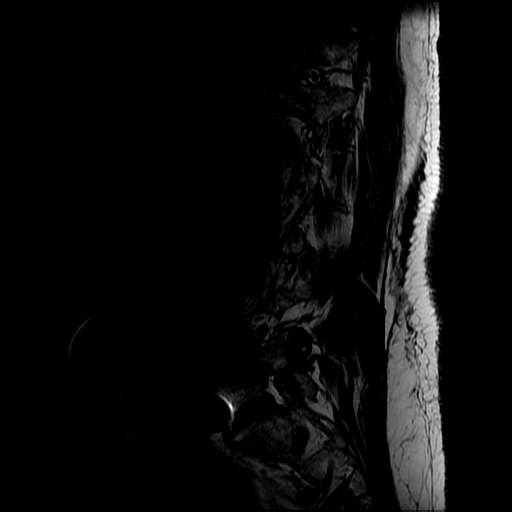
[im 8/14]
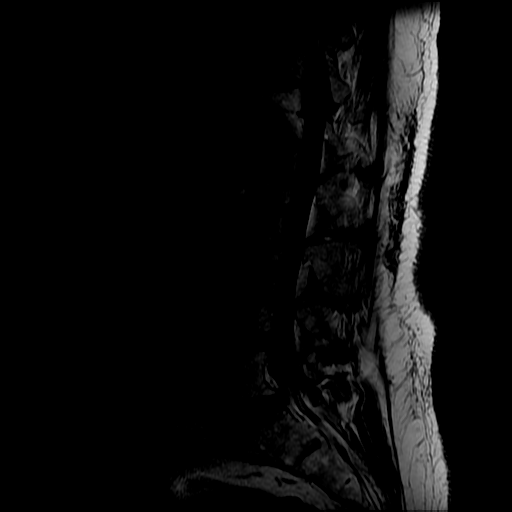
[im 14/14]
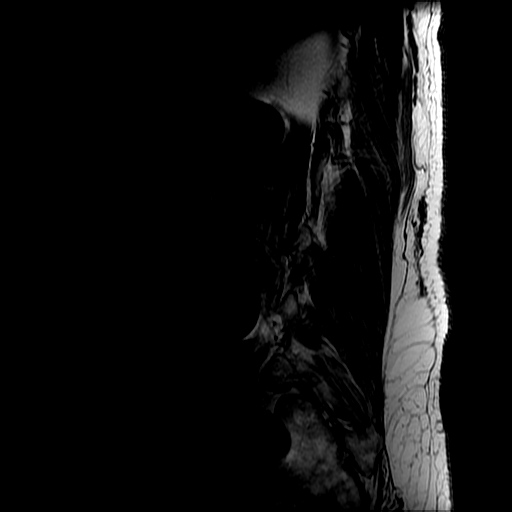

[Series 7: T2 · axial · 4.0mm · 0.39mm/px · z∈[-44,+118]mm · 6 of 36 slices shown (2 of 2)]
[im 1/36]
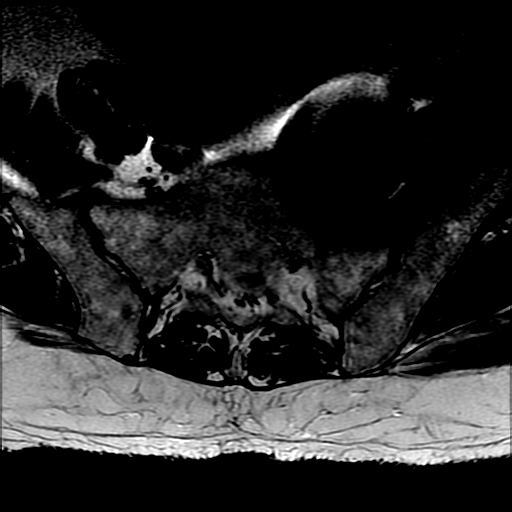
[im 6/36]
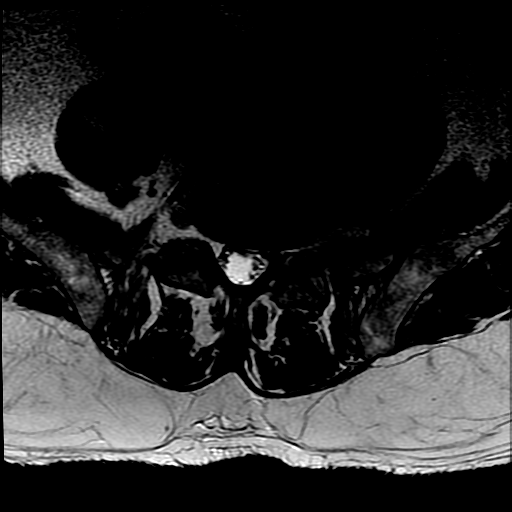
[im 11/36]
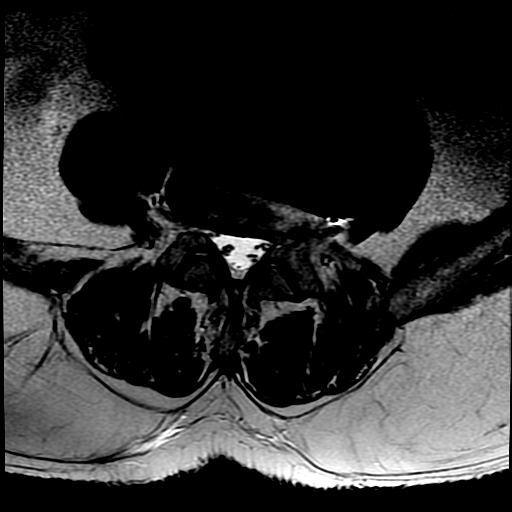
[im 16/36]
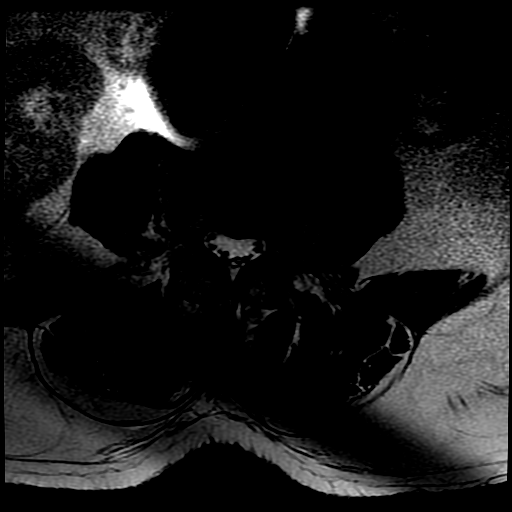
[im 18/36]
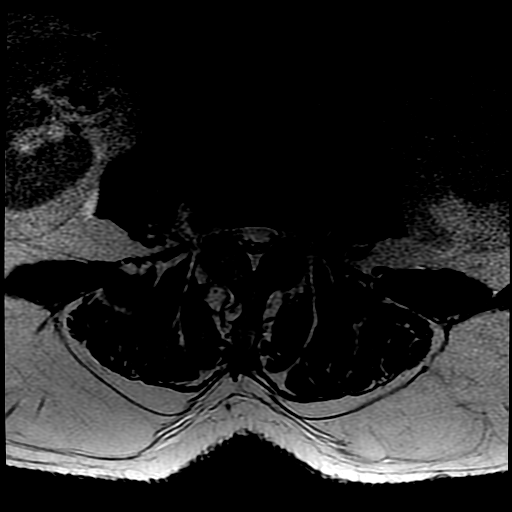
[im 31/36]
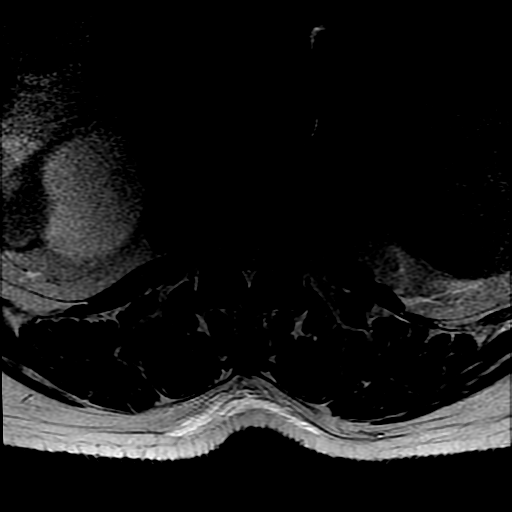

[Series 8: T1 · axial · 4.0mm · 0.39mm/px · z∈[-20,+118]mm · 3 of 36 slices shown (2 of 2)]
[im 6/36]
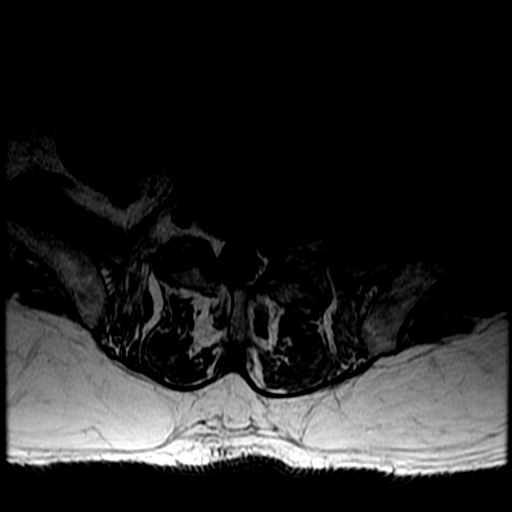
[im 18/36]
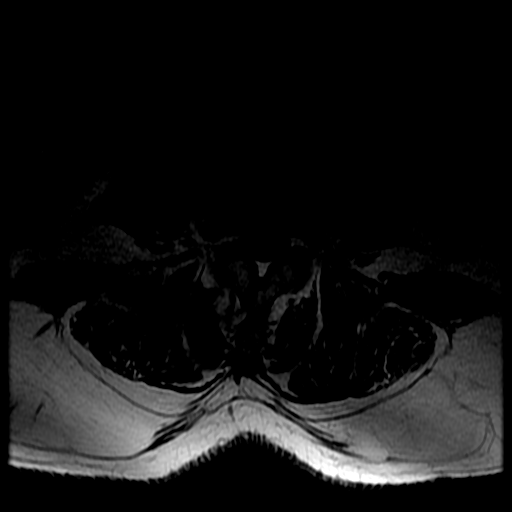
[im 31/36]
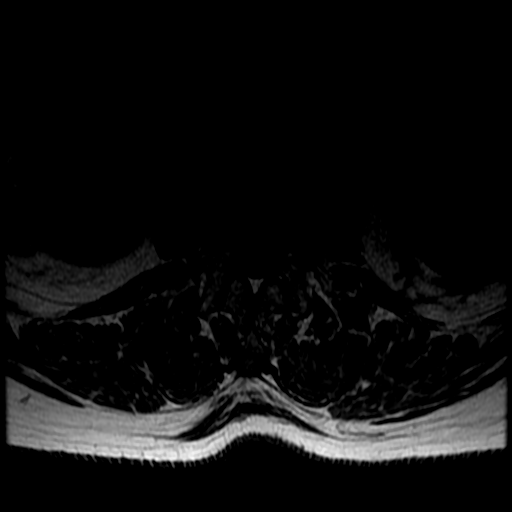

[18 of 48 positions shown; findings below may reference images not displayed]

FINDINGS: Segmentation: Study somewhat limited by extensive susceptibility
artifact from aortic aneurysm hardware.

Normal segmentation. Lowest well-formed disc is labeled the L5-S1
level.

Alignment: Mild levoscoliosis. Vertebral bodies are otherwise
normally aligned with preservation of the normal lumbar lordosis. No
listhesis.

Vertebrae: Vertebral body heights grossly maintained. No evidence
for acute or chronic fracture. Signal intensity within the vertebral
body bone marrow grossly normal, although evaluation limited by
susceptibility artifact. No discrete osseous lesions appreciated.

Conus medullaris: Extends to the L1 level. Visualized cord is normal
in appearance.

Paraspinal and other soft tissues: Visualized paraspinous soft
tissues demonstrate no acute abnormality. Large 6.1 cm cyst noted
within the right kidney. Again, extends to susceptibility artifact
from aneurysm repair.

Disc levels:

L1-2:  Negative.

L2-3: Mild diffuse disc bulge with disc desiccation. Disc bulging is
eccentric to the left. No discrete focal disc herniation. No
significant canal or foraminal stenosis.

L3-4: Mild diffuse disc bulge with intervertebral disc space
narrowing and probable disc desiccation. Superimposed right
foraminal disc protrusion closely approximates the exiting right L3
nerve root without frank neural impingement (series 7, image 19). No
significant canal stenosis. Mild right foraminal narrowing.

L4-5: No significant disc bulge. Mild facet hypertrophy. Central
canal widely patent. Mild bilateral foraminal narrowing.

L5-S1: Shallow broad-based central/left subarticular disc protrusion
(series 4, image 7). Associated annular fissure. Protruding discs
mildly encroaches upon the left lateral recess, and contacts the
transiting left S1 nerve root (series 8, image 31). This could
potentially result in left lower extremity radicular symptoms. Mild
bilateral facet arthrosis with ligamentum flavum hypertrophy. Mild
reactive edema about the right L5-S1 facet (series 6, image 3),
presumably degenerative in nature. No significant canal or foraminal
stenosis.
IMPRESSION: 1. Shallow central/left subarticular disc protrusion at L5-S1,
contacting the transiting left S1 nerve root. This could potentially
result in left lower extremity radicular symptoms.
2. Shallow right foraminal disc protrusion at L3-4, closely
approximating the exiting right L3 nerve root.
3. Mild left eccentric degenerative disc bulging at L2-3 without
significant stenosis or evidence for neural impingement.
4. Mild bilateral facet arthropathy at L5-S1, with associated mild
reactive edema about the right L5-S1 facet.

## 2017-11-17 DIAGNOSIS — E119 Type 2 diabetes mellitus without complications: Secondary | ICD-10-CM | POA: Diagnosis not present

## 2017-11-17 DIAGNOSIS — H25043 Posterior subcapsular polar age-related cataract, bilateral: Secondary | ICD-10-CM | POA: Diagnosis not present

## 2017-11-17 DIAGNOSIS — H2513 Age-related nuclear cataract, bilateral: Secondary | ICD-10-CM | POA: Diagnosis not present

## 2017-11-17 DIAGNOSIS — H524 Presbyopia: Secondary | ICD-10-CM | POA: Diagnosis not present

## 2017-11-23 ENCOUNTER — Other Ambulatory Visit: Payer: Self-pay | Admitting: Nurse Practitioner

## 2017-11-24 ENCOUNTER — Ambulatory Visit (INDEPENDENT_AMBULATORY_CARE_PROVIDER_SITE_OTHER): Payer: Medicare HMO | Admitting: *Deleted

## 2017-11-24 DIAGNOSIS — J4540 Moderate persistent asthma, uncomplicated: Secondary | ICD-10-CM

## 2017-11-25 DIAGNOSIS — H25042 Posterior subcapsular polar age-related cataract, left eye: Secondary | ICD-10-CM | POA: Diagnosis not present

## 2017-11-25 DIAGNOSIS — H2512 Age-related nuclear cataract, left eye: Secondary | ICD-10-CM | POA: Diagnosis not present

## 2017-11-25 DIAGNOSIS — H2511 Age-related nuclear cataract, right eye: Secondary | ICD-10-CM | POA: Diagnosis not present

## 2017-11-25 DIAGNOSIS — H25041 Posterior subcapsular polar age-related cataract, right eye: Secondary | ICD-10-CM | POA: Diagnosis not present

## 2017-12-01 ENCOUNTER — Ambulatory Visit: Payer: Medicare HMO | Admitting: Allergy & Immunology

## 2017-12-01 ENCOUNTER — Encounter: Payer: Self-pay | Admitting: Allergy & Immunology

## 2017-12-01 VITALS — BP 136/70 | HR 62 | Resp 18

## 2017-12-01 DIAGNOSIS — F17200 Nicotine dependence, unspecified, uncomplicated: Secondary | ICD-10-CM

## 2017-12-01 DIAGNOSIS — J302 Other seasonal allergic rhinitis: Secondary | ICD-10-CM | POA: Diagnosis not present

## 2017-12-01 DIAGNOSIS — J449 Chronic obstructive pulmonary disease, unspecified: Secondary | ICD-10-CM | POA: Diagnosis not present

## 2017-12-01 DIAGNOSIS — J3089 Other allergic rhinitis: Secondary | ICD-10-CM

## 2017-12-01 DIAGNOSIS — J4489 Other specified chronic obstructive pulmonary disease: Secondary | ICD-10-CM

## 2017-12-01 NOTE — Progress Notes (Signed)
FOLLOW UP  Date of Service/Encounter:  12/01/17   Assessment:   Asthma-COPD overlap syndrome - with improved lung function on Xolair therapy  Worsening shortness of breath - with improved lung function  Perennial and seasonal allergic rhinitis (cat, dog, cockroach, trees, weeds, and ragweed)  Current smoker (three packs per day)  Complex medical history  Unclear compliance history   Plan/Recommendations:   1. Asthma-COPD overlap syndrome - Lung function looks slightly better than before (better than it has ever looked since I started seeing you). - I would recommend starting your day with 2-4 puffs of albuterol before you start all of your physical activity.  - I also recommended that Mr. Wheatley touch base with his Cardiology Team to see if there is something else that needs to happen from their perspective since his current set of symptoms are not consistent with his lung testing.  - Daily controller medication(s): Trelegy 100/62.5/25 one puff once daily + Xolair every two weeks - Prior to physical activity: ProAir 2 puffs 10-15 minutes before physical activity. - Rescue medications: ProAir 4 puffs every 4-6 hours as needed or albuterol nebulizer one vial every 4-6 hours as needed - Asthma control goals:  * Full participation in all desired activities (may need albuterol before activity) * Albuterol use two time or less a week on average (not counting use with activity) * Cough interfering with sleep two time or less a month * Oral steroids no more than once a year * No hospitalizations  2. Chronic rhinitis (cat, dog, cockroach, trees, weeds, and ragweed) - Continue with nasal saline rinses 1-2 times daily to try to heal your nose and prevent more bleeds. - Use an antihistamine as needed (Allegra, Zyrtec, Claritin).   3. Return in about 4 months (around 03/28/2018).  Subjective:   Kevin Frank is a 77 y.o. male presenting today for follow up of  Chief  Complaint  Patient presents with  . Asthma  . Cough    Worse in the morning    Kevin Frank has a history of the following: Patient Active Problem List   Diagnosis Date Noted  . Seasonal and perennial allergic rhinitis 09/22/2017  . Asthma-COPD overlap syndrome ([place]) 09/22/2017  . Obstructive sleep apnea on CPAP 04/23/2017  . Chronic knee pain 04/23/2017  . AKI (acute kidney injury) ([place]) 04/23/2017  . Chronic diastolic CHF (congestive heart failure) ([place]) 04/23/2017  . Cellulitis 04/23/2017  . Cellulitis of left lower leg   . Smoking greater than 40 pack years 03/05/2016  . Incidental lung nodule, > 15mm and < 40mm 03/05/2016  . Stage 2 moderate COPD by GOLD classification ([place]) 03/05/2016  . AAA (abdominal aortic aneurysm) without rupture ([place]) 06/14/2015  . OBSTRUCTIVE SLEEP APNEA 06/09/2007  . HYPERCHOLESTEROLEMIA 05/06/2007  . GOUT 05/06/2007  . DEPRESSION 05/06/2007  . Hypertension, essential 05/06/2007  . ABDOMINAL AORTIC ANEURYSM 05/06/2007  . BENIGN PROSTATIC HYPERTROPHY, HX OF 05/06/2007    History obtained from: chart review and patient.  Zenaida Niece Primary Care Provider is Sinda Du, MD.     Kevin Frank is a 77 y.o. male presenting for a sick visit. He was last seen in April 2019. At that time, we continued him on Trelegy one puff once daily and we made the decision to start Xolair. We continued him on nasal saline rinses 1-2 times daily to improve nasal healing and prevent future nose bleeds. We also recommended using an antihistamine daily.  Since the last visit, he has  mostly done well. However, his wife called this morning to let us know that he was having worsening shortness of breath and difficulty doing the same activities that he has always tolerated. She requested that he be seen today for a sick visit.   Patient endorses using Trelegy daily, but does not use albuterol. He has been started on Xolair and has received 3 injections so far. He feels  that his breathing might be worsening since starting Xolair. He is able to perform ADLs without problems but has noticed decreased endurance over the last few months with activities he was able to perform without issues previously. He states that he had to stop due to shortness of breath after hauling only 3x 50lb bags of concrete; he also got severely short of breath after walking an uphill driveway yesterday and had to rest for 5 minutes before he felt relief; he did not have to stop along the way. During these episodes, he denies wheezing, cough, chest tightness or chest pain.   Patient continues to endorse daily, productive cough worse in the morning. He denies significant nasal congestion, rhinorrhea, post-nasal drip. He has not had further nose bleeds. He doesn't use anti-histamines or nasal sprays/irrigation.  He continues to smoke and does not plan on stopping at this time. He has been very busy at home preparing for the upcoming [employer].   Otherwise, there have been no changes to his past medical history, surgical history, family history, or social history.    Review of Systems: a 14-point review of systems is pertinent for what is mentioned in HPI.  Otherwise, all other systems were negative. Constitutional: negative other than that listed in the HPI Eyes: negative other than that listed in the HPI Ears, nose, mouth, throat, and face: negative other than that listed in the HPI Respiratory: negative other than that listed in the HPI Cardiovascular: negative other than that listed in the HPI Gastrointestinal: negative other than that listed in the HPI Genitourinary: negative other than that listed in the HPI Integument: negative other than that listed in the HPI Hematologic: negative other than that listed in the HPI Musculoskeletal: negative other than that listed in the HPI Neurological: negative other than that listed in the HPI Allergy/Immunologic: negative other  than that listed in the HPI    Objective:   Blood pressure 136/70, pulse 62, resp. rate 18, SpO2 95 %. There is no height or weight on file to calculate BMI.   Physical Exam:  General: Alert, interactive, in no acute distress. Joyful, pleasant male.  Eyes: No conjunctival injection bilaterally, no discharge on the right, no discharge on the left and no Horner-Trantas dots present. PERRL bilaterally. EOMI without pain. No photophobia.  Ears: Right TM pearly gray with normal light reflex, Left TM pearly gray with normal light reflex, Right TM intact without perforation and Left TM intact without perforation.  Nose/Throat: External nose within normal limits and septum midline. Turbinates edematous and pale with clear discharge. Posterior oropharynx erythematous with cobblestoning in the posterior oropharynx. Tonsils 2+ without exudates.  Tongue without thrush. Lungs: Clear to auscultation without wheezing, rhonchi or rales. No increased work of breathing. Moving air well in all lung fields.  CV: Normal S1/S2. No murmurs. Capillary refill <2 seconds.  Skin: Warm and dry, without lesions or rashes. Neuro:   Grossly intact. No focal deficits appreciated. Responsive to questions.  Diagnostic studies:   Spirometry: results abnormal (FEV1: 1.90/72%, FVC: 2.90/72%, FEV1/FVC: 65%).    Spirometry consistent  with mixed obstructive and restrictive disease. Overall his values are improved compared to previous studies.   Allergy Studies: none    Salvatore Marvel, MD  [facility]

## 2017-12-01 NOTE — Patient Instructions (Addendum)
1. Asthma-COPD overlap syndrome - Lung function looks slightly better than before (better than it has ever looked since I started seeing you). - I would recommend starting your day with 2-4 puffs of albuterol before you start all of your physical activity.  - Daily controller medication(s): Trelegy 100/62.5/25 one puff once daily + Xolair every two weeks - Prior to physical activity: ProAir 2 puffs 10-15 minutes before physical activity. - Rescue medications: ProAir 4 puffs every 4-6 hours as needed or albuterol nebulizer one vial every 4-6 hours as needed - Asthma control goals:  * Full participation in all desired activities (may need albuterol before activity) * Albuterol use two time or less a week on average (not counting use with activity) * Cough interfering with sleep two time or less a month * Oral steroids no more than once a year * No hospitalizations  2. Chronic rhinitis (cat, dog, cockroach, trees, weeds, and ragweed) - Continue with nasal saline rinses 1-2 times daily to try to heal your nose and prevent more bleeds. - Use an antihistamine as needed (Allegra, Zyrtec, Claritin).   3. Return in about 4 months (around 03/28/2018).   Please inform us of any Emergency Department visits, hospitalizations, or changes in symptoms. Call us before going to the ED for breathing or allergy symptoms since we might be able to fit you in for a sick visit. Feel free to contact us anytime with any questions, problems, or concerns.  It was a pleasure to see you again today!   Websites that have reliable patient information: 1. American Academy of Asthma, Allergy, and Immunology: www.aaaai.org 2. Food Allergy Research and Education (FARE): foodallergy.org 3. Mothers of Asthmatics: http://www.asthmacommunitynetwork.org 4. American College of Allergy, Asthma, and Immunology: www.acaai.org

## 2017-12-02 ENCOUNTER — Telehealth: Payer: Self-pay | Admitting: *Deleted

## 2017-12-02 NOTE — Progress Notes (Signed)
Please call - see if his weight is up.  I could see when I come back later this month.

## 2017-12-02 NOTE — Telephone Encounter (Signed)
S/w pt, weight is not up, and pt stated "holding his own".  Pt was made a f/u appt today for June 26 to see Lori. Will send to [facility] to [facility].

## 2017-12-04 DIAGNOSIS — H2512 Age-related nuclear cataract, left eye: Secondary | ICD-10-CM | POA: Diagnosis not present

## 2017-12-04 DIAGNOSIS — H25042 Posterior subcapsular polar age-related cataract, left eye: Secondary | ICD-10-CM | POA: Diagnosis not present

## 2017-12-07 DIAGNOSIS — Z0000 Encounter for general adult medical examination without abnormal findings: Secondary | ICD-10-CM | POA: Diagnosis not present

## 2017-12-07 DIAGNOSIS — Z23 Encounter for immunization: Secondary | ICD-10-CM | POA: Diagnosis not present

## 2017-12-07 LAB — HM DIABETES FOOT EXAM: HM Diabetic Foot Exam: DECREASED

## 2017-12-08 ENCOUNTER — Ambulatory Visit: Payer: Medicare HMO

## 2017-12-16 ENCOUNTER — Ambulatory Visit: Payer: Medicare HMO | Admitting: Nurse Practitioner

## 2017-12-23 DIAGNOSIS — L570 Actinic keratosis: Secondary | ICD-10-CM | POA: Diagnosis not present

## 2017-12-23 DIAGNOSIS — D0439 Carcinoma in situ of skin of other parts of face: Secondary | ICD-10-CM | POA: Diagnosis not present

## 2017-12-23 DIAGNOSIS — D485 Neoplasm of uncertain behavior of skin: Secondary | ICD-10-CM | POA: Diagnosis not present

## 2017-12-25 ENCOUNTER — Other Ambulatory Visit: Payer: Self-pay | Admitting: Nurse Practitioner

## 2018-01-07 DIAGNOSIS — C44329 Squamous cell carcinoma of skin of other parts of face: Secondary | ICD-10-CM | POA: Diagnosis not present

## 2018-01-19 ENCOUNTER — Ambulatory Visit (INDEPENDENT_AMBULATORY_CARE_PROVIDER_SITE_OTHER): Payer: Medicare HMO

## 2018-01-19 ENCOUNTER — Telehealth: Payer: Self-pay

## 2018-01-19 ENCOUNTER — Other Ambulatory Visit: Payer: Self-pay

## 2018-01-19 DIAGNOSIS — J4540 Moderate persistent asthma, uncomplicated: Secondary | ICD-10-CM

## 2018-01-19 MED ORDER — FLUTICASONE-UMECLIDIN-VILANT 100-62.5-25 MCG/INH IN AEPB: 1.0000 | INHALATION_SPRAY | Freq: Every day | RESPIRATORY_TRACT | 3 refills | Status: DC

## 2018-01-19 NOTE — Telephone Encounter (Signed)
Patient is requesting his prescription for Trelegy 100 be sent in to [facility] due to it being cheaper if he is self pay with this pharmacy.   Please Advise

## 2018-01-19 NOTE — Telephone Encounter (Signed)
Rx sent and patient informed.

## 2018-01-25 ENCOUNTER — Encounter: Payer: Self-pay | Admitting: Family Medicine

## 2018-01-25 DIAGNOSIS — I10 Essential (primary) hypertension: Secondary | ICD-10-CM | POA: Diagnosis not present

## 2018-01-25 DIAGNOSIS — J449 Chronic obstructive pulmonary disease, unspecified: Secondary | ICD-10-CM | POA: Diagnosis not present

## 2018-01-25 DIAGNOSIS — L03116 Cellulitis of left lower limb: Secondary | ICD-10-CM | POA: Diagnosis not present

## 2018-01-25 DIAGNOSIS — E119 Type 2 diabetes mellitus without complications: Secondary | ICD-10-CM | POA: Diagnosis not present

## 2018-01-25 DIAGNOSIS — I5033 Acute on chronic diastolic (congestive) heart failure: Secondary | ICD-10-CM | POA: Diagnosis not present

## 2018-01-26 ENCOUNTER — Encounter: Payer: Self-pay | Admitting: Nurse Practitioner

## 2018-01-26 ENCOUNTER — Other Ambulatory Visit: Payer: Self-pay

## 2018-01-26 ENCOUNTER — Ambulatory Visit: Payer: Medicare HMO | Admitting: Allergy & Immunology

## 2018-01-26 ENCOUNTER — Ambulatory Visit: Payer: Medicare HMO | Admitting: Nurse Practitioner

## 2018-01-26 ENCOUNTER — Ambulatory Visit (HOSPITAL_COMMUNITY): Payer: Medicare HMO | Attending: Nurse Practitioner

## 2018-01-26 ENCOUNTER — Telehealth: Payer: Self-pay | Admitting: *Deleted

## 2018-01-26 VITALS — BP 180/90 | HR 64 | Ht 70.0 in | Wt 240.0 lb

## 2018-01-26 DIAGNOSIS — R918 Other nonspecific abnormal finding of lung field: Secondary | ICD-10-CM | POA: Diagnosis not present

## 2018-01-26 DIAGNOSIS — J449 Chronic obstructive pulmonary disease, unspecified: Secondary | ICD-10-CM | POA: Diagnosis not present

## 2018-01-26 DIAGNOSIS — E669 Obesity, unspecified: Secondary | ICD-10-CM | POA: Diagnosis not present

## 2018-01-26 DIAGNOSIS — E785 Hyperlipidemia, unspecified: Secondary | ICD-10-CM | POA: Insufficient documentation

## 2018-01-26 DIAGNOSIS — E7800 Pure hypercholesterolemia, unspecified: Secondary | ICD-10-CM

## 2018-01-26 DIAGNOSIS — I2510 Atherosclerotic heart disease of native coronary artery without angina pectoris: Secondary | ICD-10-CM | POA: Insufficient documentation

## 2018-01-26 DIAGNOSIS — I10 Essential (primary) hypertension: Secondary | ICD-10-CM | POA: Diagnosis not present

## 2018-01-26 DIAGNOSIS — I5031 Acute diastolic (congestive) heart failure: Secondary | ICD-10-CM | POA: Insufficient documentation

## 2018-01-26 DIAGNOSIS — Z6834 Body mass index (BMI) 34.0-34.9, adult: Secondary | ICD-10-CM | POA: Diagnosis not present

## 2018-01-26 DIAGNOSIS — F17200 Nicotine dependence, unspecified, uncomplicated: Secondary | ICD-10-CM

## 2018-01-26 DIAGNOSIS — E119 Type 2 diabetes mellitus without complications: Secondary | ICD-10-CM | POA: Insufficient documentation

## 2018-01-26 DIAGNOSIS — I110 Hypertensive heart disease with heart failure: Secondary | ICD-10-CM | POA: Insufficient documentation

## 2018-01-26 DIAGNOSIS — R0600 Dyspnea, unspecified: Secondary | ICD-10-CM

## 2018-01-26 DIAGNOSIS — R911 Solitary pulmonary nodule: Secondary | ICD-10-CM | POA: Diagnosis not present

## 2018-01-26 LAB — PRO B NATRIURETIC PEPTIDE: NT-Pro BNP: 161 pg/mL (ref 0–486)

## 2018-01-26 LAB — ECHOCARDIOGRAM COMPLETE
Height: 70 in
Weight: 3840 oz

## 2018-01-26 MED ORDER — HYDRALAZINE HCL 50 MG PO TABS: 50.0000 mg | ORAL_TABLET | Freq: Two times a day (BID) | ORAL | 6 refills | Status: DC

## 2018-01-26 NOTE — Patient Instructions (Addendum)
We will be checking the following labs today - BNP  I will get your labs sent to me from yesterday   Medication Instructions:    Continue with your current medicines. BUT  I am increasing the Hydralazine to 50 mg twice a day - you can take 2 of the 25 mg tablets twice a day to use up - the RX for the 50 mg twice a day is at your drug store.     Testing/Procedures To Be Arranged:  Echocardiogram  CT chest without contrast to follow up lung nodule  Follow-Up:   See me as planned next week    Other Special Instructions:   Salt restriction is imperative  Do not take Meloxicam/Mobic or other NSAIDs - Advil, Aleve, Ibuprofen, etc    If you need a refill on your cardiac medications before your next appointment, please call your pharmacy.   Call the [place] office at [phone] if you have any questions, problems or concerns.

## 2018-01-26 NOTE — Telephone Encounter (Signed)
S/w Becky @ Dr. Luan Pulling office @ [phone] to get pt's recent labs faxed to office from yesterday ov.

## 2018-01-26 NOTE — Progress Notes (Signed)
CARDIOLOGY OFFICE NOTE  Date:  01/26/2018    [name] Date of Birth: 1941-03-06 Medical Record #[id number]  PCP:  Sinda Du, MD  Cardiologist:  Jerel Shepherd    Chief Complaint  Patient presents with  . Hypertension  . Hyperlipidemia    Follow up visit - seen for Dr. Burt Knack    History of Present Illness: Kevin Frank is a 77 y.o. male who presents today for a 5 month check. Seen for Dr. Burt Knack. He is a former patient of Dr. Susa Simmonds.   He has a history of hypertension, hyperlipidemia, DM, tobacco abuse, COPD, and abdominal aortic aneurysm.   On 04/09/2016 he underwent FEVARwith a single rightrenal fenestration by Dr. Trula Slade. He also had left renal angioplasty at the time of the main body deployment service to ensure that the graft not cover the renal artery because of angulation. The day following his procedure, his creatinine increased and ultrasound did not show good perfusion to the left kidney and therefore he went to the Cath Lab and had stenting of his left renal artery. His creatinine trended down and his urine output improved. The patient also had issues with left leg numbness in his legs after the procedure. Neurology was consulted. It was felt this was most likely secondary to lower back disease that was aggravated during surgery.   He continues to smoke cigarettes. He has not had any known CAD symptoms but has had prior CT scan showing left main and 3VD. He has had lung nodules noted as well.    I have seen him over the past several years - he is not interested in stopping smoking. Meds have been adjusted for his BP.  He has had some stress with legal issues over the last few times seen.   Last seen by me back in March. No real change. Did call here in June with shortness of breath - I offered to see him sooner but he declined.   Comes in today. Herewith his wife today. He laughs to most of my questions. Saw Dr. Luan Pulling yesterday - was  told his heart was "stiff". Placed on antibiotics - legs weeping and swelling. Lasix was changed to Torsemide. Medicines a little unclear - may have been on Mobic - he is not sure but sounds like he has been taking. He really does not know his actual medicines - just "goes by the bottles". Still with lots of excessive salt use. Still smoking - has a pack in his shirt pocket. No actual chest pain. More shortness of breath.   Past Medical History:  Diagnosis Date  . AAA (abdominal aortic aneurysm) ([place]) 01/2008   3.2cm  . Asthma   . BPH (benign prostatic hyperplasia)   . Chronic knee pain    bilateral  . COPD (chronic obstructive pulmonary disease) ([place])   . Diabetes mellitus    type II  . Gout   . History of kidney stones   . Hypercholesterolemia   . Hyperlipidemia   . Hypertension   . Normal nuclear stress test 2011  . Obesity   . Obstructive sleep apnea on CPAP   . Presence of indwelling urinary catheter   . Shortness of breath    due to cigarette abuse  . Tobacco abuse     Past Surgical History:  Procedure Laterality Date  . ABDOMINAL AORTIC ENDOVASCULAR FENESTRATED STENT GRAFT N/A 04/09/2016   Procedure: ABDOMINAL AORTIC ENDOVASCULAR FENESTRATED STENT GRAFT with left brachial artery  access using ultrasound guidance;  Surgeon: Serafina Mitchell, MD;  Location: [facility];  Service: Vascular;  Laterality: N/A;  . [place]   [place]  . PERIPHERAL VASCULAR CATHETERIZATION N/A 04/10/2016   Procedure: Renal Angiography;  Surgeon: Angelia Mould, MD;  Location: [facility];  Service: Cardiovascular;  Laterality: N/A;  . TRANSURETHRAL RESECTION OF PROSTATE  01/07/2011   Procedure: TRANSURETHRAL RESECTION OF THE PROSTATE (TURP);  Surgeon: Marissa Nestle;  Location: [facility];  Service: Urology;  Laterality: N/A;     Medications: Current Meds  Medication Sig  . albuterol (PROVENTIL HFA;VENTOLIN HFA) 108 (90 BASE) MCG/ACT inhaler Inhale 2 puffs into the lungs every 6  (six) hours as needed (rescue inhaler).   [name] allopurinol (ZYLOPRIM) 300 MG tablet Take 300 mg by mouth daily.    [name] amLODipine (NORVASC) 10 MG tablet Take 1 tablet (10 mg total) by mouth daily.  [name] atorvastatin (LIPITOR) 20 MG tablet Take 20 mg by mouth daily.   [name] azelastine (ASTELIN) 0.1 % nasal spray Place 2 sprays into both nostrils 2 (two) times daily.  . clopidogrel (PLAVIX) 75 MG tablet TAKE (1) TABLET BY MOUTH ONCE DAILY.  [name] Dextromethorphan-Guaifenesin (MUCINEX DM MAXIMUM STRENGTH) 60-1200 MG TB12 Take 2 tablets by mouth daily as needed (cough and congestion).  [name] doxycycline (MONODOX) 100 MG capsule Take 100 mg by mouth 2 (two) times daily.  [name] doxycycline (VIBRAMYCIN) 100 MG capsule Take 100 mg by mouth 2 (two) times daily.  [name] EPINEPHrine (EPIPEN 2-PAK) 0.3 mg/0.3 mL IJ SOAJ injection Use as directed for severe allergic reaction  . Fluticasone-Umeclidin-Vilant (TRELEGY ELLIPTA) 100-62.5-25 MCG/INH AEPB Inhale 1 puff into the lungs daily. Rinse, gargle, and spit after use.  . hydrALAZINE (APRESOLINE) 25 MG tablet TAKE (1) TABLET TWICE DAILY.  [name] losartan (COZAAR) 100 MG tablet TAKE 1 TABLET (100 MG TOTAL) BY MOUTH DAILY.  . metFORMIN (GLUCOPHAGE) 500 MG tablet Take 500 mg by mouth 2 (two) times daily with a meal.   . metoprolol succinate (TOPROL-XL) 25 MG 24 hr tablet Take 1 tablet (25 mg total) by mouth daily.  . sertraline (ZOLOFT) 50 MG tablet Take 50 mg by mouth daily.    [name] SPIRIVA HANDIHALER 18 MCG inhalation capsule Place 18 mcg into inhaler and inhale daily.   [name] torsemide (DEMADEX) 20 MG tablet Take 20 mg by mouth 2 (two) times daily.   Current Facility-Administered Medications for the 01/26/18 encounter (Office Visit) with Burtis Junes, NP  Medication  . ipratropium (ATROVENT) nebulizer solution 0.5 mg  . levalbuterol (XOPENEX) nebulizer solution 1.25 mg  . omalizumab Arvid Right) injection 375 mg     Allergies: Allergies  Allergen Reactions  . No Known Allergies     Social  History: The patient  reports that he has been smoking cigarettes.  He has a 100.00 pack-year smoking history. He has never used smokeless tobacco. He reports that he drinks alcohol. He reports that he does not use drugs.   Family History: The patient's family history includes Asthma in his daughter; Cancer in his mother; Diabetes in his mother; Hypertension in his mother; Liver disease in his father; Lymphoma in his mother; Mental retardation in his unknown relative.   Review of Systems: Please see the history of present illness.   Otherwise, the review of systems is positive for none.   All other systems are reviewed and negative.   Physical Exam: VS:  BP (!) 180/90 (BP Location: Left Arm, Patient Position: Sitting, Cuff  Size: Normal)   Pulse 64   Ht 5\' 10"  (1.778 m)   Wt 240 lb (108.9 kg)   SpO2 94% Comment: at rest  BMI 34.44 kg/m  .  BMI Body mass index is 34.44 kg/m.  Wt Readings from Last 3 Encounters:  01/26/18 240 lb (108.9 kg)  10/19/17 240 lb (108.9 kg)  09/18/17 238 lb (108 kg)    General:  Looks chronically ill but alert and in no acute distress.  Face quite ruddy/red. Harsh cough. Smells heavily of tobacco.  HEENT: Normal.  Neck: Supple, no JVD, carotid bruits, or masses noted.  Cardiac: Heart tones are distant. 1+ edema. Lower legs with blisters/weeping noted.  Respiratory:  Lungs are coarse.  GI: Soft and nontender.  MS: No deformity or atrophy. Gait and ROM intact.  Skin: Warm and dry. Color is normal.  Neuro:  Strength and sensation are intact and no gross focal deficits noted.  Psych: Alert, appropriate and with normal affect.   LABORATORY DATA:  EKG:  EKG is not ordered today.   Lab Results  Component Value Date   WBC 14.0 (H) 10/19/2017   HGB 15.8 10/19/2017   HCT 47.7 10/19/2017   PLT 165 10/19/2017   GLUCOSE 151 (H) 10/19/2017   CHOL 138 12/16/2010   TRIG 53.0 12/16/2010   HDL 46.10 12/16/2010   LDLCALC 81 12/16/2010   ALT 23 04/02/2016    AST 18 04/02/2016   NA 138 10/19/2017   K 4.3 10/19/2017   CL 101 10/19/2017   CREATININE 1.02 10/19/2017   BUN 16 10/19/2017   CO2 25 10/19/2017   INR 1.08 04/09/2016   HGBA1C 6.0 (H) 04/02/2016     BNP (last 3 results) No results for input(s): BNP in the last 8760 hours.  ProBNP (last 3 results) No results for input(s): PROBNP in the last 8760 hours.   Other Studies Reviewed Today:  Myoview Study Highlights from 02/2016   Nuclear stress EF: 58%.  There was no ST segment deviation noted during stress.  The study is normal.  This is a low risk study.    Echo Study Conclusions from 02/2016  - Left ventricle: The cavity size was normal. Wall thickness was increased in a pattern of mild LVH. Systolic function was normal. The estimated ejection fraction was in the range of 60% to 65%. Wall motion was normal; there were no regional wall motion abnormalities. Features are consistent with a pseudonormal left ventricular filling pattern, with concomitant abnormal relaxation and increased filling pressure (grade 2 diastolic dysfunction). Doppler parameters are consistent with high ventricular filling pressure. - Mitral valve: There was mild regurgitation. - Left atrium: The atrium was moderately dilated. - Pulmonary arteries: Systolic pressure was mildly increased. PA peak pressure: 37 mm Hg (S).  Impressions:  - Normal LV systolic function; grade 2 diastolic dysfunction with elevated LV filling pressure; mild MR; moderate LAE; trace TR with mildly elevated pulmonary pressure.  CT ANGIO CHEST/ABD/PELVIS IMPRESSION: 1. Patent suprarenal bifurcated aortic stent graft without endoleak, native sac diameter 5.3 cm. 2. Coronary and Aortic Atherosclerosis (ICD10-170.0) without dissection. 3. Descending and sigmoid diverticulosis.   Electronically Signed By: Lucrezia Europe M.D. On: 05/16/2016 11:02    Assessment/Plan:  1. HTN -  BP quite elevated. Increasing Hydralazine to 50 mg BID. Needs to avoid all NSAIDs. Updating his echo.    2. Worsening shortness of breath - most likely multifactorial - smoking/COPD/diastolic HF. Needs echo updated.  Needs CT chest updated. Unfortunately, has very poor insight  into his issues with no real desire to make changes.   3. HLD - on statin - his labs are checked by his PCP.   4. Long standing tobacco - he is not ready to stop despite extensive counseling.   5. Prior AAA/FEVAR repair with stenting of the left renal artery - followed by [facility].  6. Chronic diastolic HF - more swelling on exam - his diuretics have been changed. He needs to restrict his salt - not sure he is able to do this - updating his echo.   7. 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   Unfortunately, he does not seem to have the motivation to follow thru with this plan of care - this is a chronic finding - now with overall situation tenuous with poor prognosis in my opinion.   Current medicines are reviewed with the patient today.  The patient does not have concerns regarding medicines other than what has been noted above.  The following changes have been made:  See above.  Labs/ tests ordered today include:    Orders Placed This Encounter  Procedures  . CT Chest Wo Contrast  . ECHOCARDIOGRAM COMPLETE     Disposition:   FU with me next month as planned.    Patient is agreeable to this plan and will call if any problems develop in the interim.   SignedTruitt Merle, NP  01/26/2018 9:05 AM  [facility] [address] [place], [place]  [address] Phone: [phone] Fax: [phone]

## 2018-02-01 ENCOUNTER — Emergency Department (HOSPITAL_COMMUNITY)
Admission: EM | Admit: 2018-02-01 | Discharge: 2018-02-01 | Disposition: A | Discharge status: Home / Self Care | Payer: Medicare HMO | Attending: Emergency Medicine | Admitting: Emergency Medicine

## 2018-02-01 ENCOUNTER — Ambulatory Visit: Payer: Medicare HMO | Admitting: Surgery

## 2018-02-01 ENCOUNTER — Other Ambulatory Visit: Payer: Self-pay

## 2018-02-01 ENCOUNTER — Encounter (HOSPITAL_COMMUNITY): Payer: Self-pay | Admitting: Emergency Medicine

## 2018-02-01 ENCOUNTER — Other Ambulatory Visit (HOSPITAL_COMMUNITY): Payer: Medicare HMO

## 2018-02-01 ENCOUNTER — Encounter (HOSPITAL_COMMUNITY): Payer: Medicare HMO

## 2018-02-01 DIAGNOSIS — I5032 Chronic diastolic (congestive) heart failure: Secondary | ICD-10-CM | POA: Diagnosis not present

## 2018-02-01 DIAGNOSIS — I110 Hypertensive heart disease with heart failure: Secondary | ICD-10-CM | POA: Diagnosis not present

## 2018-02-01 DIAGNOSIS — M109 Gout, unspecified: Secondary | ICD-10-CM | POA: Diagnosis not present

## 2018-02-01 DIAGNOSIS — Z7984 Long term (current) use of oral hypoglycemic drugs: Secondary | ICD-10-CM | POA: Diagnosis not present

## 2018-02-01 DIAGNOSIS — F17210 Nicotine dependence, cigarettes, uncomplicated: Secondary | ICD-10-CM | POA: Insufficient documentation

## 2018-02-01 DIAGNOSIS — J449 Chronic obstructive pulmonary disease, unspecified: Secondary | ICD-10-CM | POA: Insufficient documentation

## 2018-02-01 DIAGNOSIS — Z79899 Other long term (current) drug therapy: Secondary | ICD-10-CM | POA: Insufficient documentation

## 2018-02-01 DIAGNOSIS — Z7902 Long term (current) use of antithrombotics/antiplatelets: Secondary | ICD-10-CM | POA: Insufficient documentation

## 2018-02-01 DIAGNOSIS — M25561 Pain in right knee: Secondary | ICD-10-CM | POA: Diagnosis present

## 2018-02-01 LAB — BASIC METABOLIC PANEL
ANION GAP: 10 (ref 5–15)
BUN: 16 mg/dL (ref 8–23)
CALCIUM: 9.4 mg/dL (ref 8.9–10.3)
CO2: 25 mmol/L (ref 22–32)
Chloride: 101 mmol/L (ref 98–111)
Creatinine, Ser: 1.03 mg/dL (ref 0.61–1.24)
Glucose, Bld: 215 mg/dL — ABNORMAL HIGH (ref 70–99)
POTASSIUM: 4.2 mmol/L (ref 3.5–5.1)
Sodium: 136 mmol/L (ref 135–145)

## 2018-02-01 MED ORDER — PREDNISONE 20 MG PO TABS: 40.0000 mg | ORAL_TABLET | Freq: Every day | ORAL | 0 refills | Status: DC

## 2018-02-01 MED ORDER — TRAMADOL HCL 50 MG PO TABS: 50.0000 mg | ORAL_TABLET | Freq: Four times a day (QID) | ORAL | 0 refills | Status: DC | PRN

## 2018-02-01 MED ORDER — COLCHICINE 0.6 MG PO TABS: 0.6000 mg | ORAL_TABLET | Freq: Every day | ORAL | 0 refills | Status: DC

## 2018-02-01 NOTE — ED Notes (Signed)
Pt reports all this pain in his knees began this past November when a Jack-Stand struck his leg causing an infection. Since Pt has been on Antibiotics and has had swelling of fluid in legs as well.

## 2018-02-01 NOTE — ED Triage Notes (Signed)
Pt from home c/o right knee pain. States he cannot move his right leg due to the pain. Pt believes he has fluid buildup in his knee. Pt currently was doubled on his BP and Heart medicine by his PCP.

## 2018-02-01 NOTE — ED Provider Notes (Signed)
[facility] EMERGENCY DEPARTMENT Provider Note   CSN: [id number] Arrival date & time: 02/01/18  5329     History   Chief Complaint Chief Complaint  Patient presents with  . Knee Pain    HPI Kevin Frank is a 77 y.o. male.  HPI Patient presents with right knee pain and swelling.  Has had for the last 3 days.  No trauma.  Has been more swollen and pain with movement.  States he is having difficulty walking due to it.  History of gout.  Has been on doxycycline for the last 8 days due to cellulitis of his lower legs.  No redness in his knee.  States he has had gout for years.  Some mild chronic trouble breathing without no change.  No chest pain.  Chronic lower extremity edema but states it is much better than his usual baseline. Past Medical History:  Diagnosis Date  . AAA (abdominal aortic aneurysm) ([place]) 01/2008   3.2cm  . Asthma   . BPH (benign prostatic hyperplasia)   . Chronic knee pain    bilateral  . COPD (chronic obstructive pulmonary disease) ([place])   . Diabetes mellitus    type II  . Gout   . History of kidney stones   . Hypercholesterolemia   . Hyperlipidemia   . Hypertension   . Normal nuclear stress test 2011  . Obesity   . Obstructive sleep apnea on CPAP   . Presence of indwelling urinary catheter   . Shortness of breath    due to cigarette abuse  . Tobacco abuse     Patient Active Problem List   Diagnosis Date Noted  . Seasonal and perennial allergic rhinitis 09/22/2017  . COPD with asthma ([place]) 09/22/2017  . Obstructive sleep apnea on CPAP 04/23/2017  . Chronic knee pain 04/23/2017  . AKI (acute kidney injury) ([place]) 04/23/2017  . Chronic diastolic CHF (congestive heart failure) ([place]) 04/23/2017  . Cellulitis 04/23/2017  . Cellulitis of left lower leg   . Smoking greater than 40 pack years 03/05/2016  . Incidental lung nodule, > 72mm and < 36mm 03/05/2016  . Stage 2 moderate COPD by GOLD classification ([place]) 03/05/2016  . AAA (abdominal aortic  aneurysm) without rupture ([place]) 06/14/2015  . OBSTRUCTIVE SLEEP APNEA 06/09/2007  . HYPERCHOLESTEROLEMIA 05/06/2007  . GOUT 05/06/2007  . DEPRESSION 05/06/2007  . Hypertension, essential 05/06/2007  . ABDOMINAL AORTIC ANEURYSM 05/06/2007  . BENIGN PROSTATIC HYPERTROPHY, HX OF 05/06/2007    Past Surgical History:  Procedure Laterality Date  . ABDOMINAL AORTIC ENDOVASCULAR FENESTRATED STENT GRAFT N/A 04/09/2016   Procedure: ABDOMINAL AORTIC ENDOVASCULAR FENESTRATED STENT GRAFT with left brachial artery access using ultrasound guidance;  Surgeon: Serafina Mitchell, MD;  Location: [place];  Service: Vascular;  Laterality: N/A;  . [employer]   [place]  . PERIPHERAL VASCULAR CATHETERIZATION N/A 04/10/2016   Procedure: Renal Angiography;  Surgeon: Angelia Mould, MD;  Location: [place] [facility];  Service: Cardiovascular;  Laterality: N/A;  . TRANSURETHRAL RESECTION OF PROSTATE  01/07/2011   Procedure: TRANSURETHRAL RESECTION OF THE PROSTATE (TURP);  Surgeon: Marissa Nestle;  Location: [facility];  Service: Urology;  Laterality: N/A;        Home Medications    Prior to Admission medications   Medication Sig Start Date End Date Taking? Authorizing Provider  albuterol (PROVENTIL HFA;VENTOLIN HFA) 108 (90 BASE) MCG/ACT inhaler Inhale 2 puffs into the lungs every 6 (six) hours as needed (rescue inhaler).  Yes [provider]  allopurinol (ZYLOPRIM) 300 MG tablet Take 300 mg by mouth daily.     Yes [provider]  amLODipine (NORVASC) 10 MG tablet Take 1 tablet (10 mg total) by mouth daily. 03/11/17  Yes Sherren Mocha, MD  amLODipine (NORVASC) 10 MG tablet 10 mg. 09/04/16  Yes [provider]  atorvastatin (LIPITOR) 20 MG tablet Take 20 mg by mouth daily.  10/13/13  Yes [provider]  atorvastatin (LIPITOR) 20 MG tablet 20 mg. 09/04/16  Yes [provider]  azelastine (ASTELIN) 0.1 % nasal spray Place 2 sprays into both nostrils 2 (two)  times daily. 04/14/16  Yes Rhyne, Samantha J, PA-C  azelastine (ASTELIN) 0.1 % nasal spray  08/07/16  Yes [provider]  clopidogrel (PLAVIX) 75 MG tablet TAKE (1) TABLET BY MOUTH ONCE DAILY. 03/04/17  Yes Waynetta Sandy, MD  clopidogrel (PLAVIX) 75 MG tablet 75 mg. 10/23/16  Yes [provider]  Dextromethorphan-Guaifenesin (MUCINEX DM MAXIMUM STRENGTH) 60-1200 MG TB12 Take 2 tablets by mouth daily as needed (cough and congestion).   Yes [provider]  doxycycline (MONODOX) 100 MG capsule Take 100 mg by mouth 2 (two) times daily.   Yes [provider]  doxycycline (VIBRA-TABS) 100 MG tablet 100 mg. 01/25/18  Yes [provider]  doxycycline (VIBRAMYCIN) 100 MG capsule Take 100 mg by mouth 2 (two) times daily. 11/03/17  Yes [provider]  Fluticasone-Umeclidin-Vilant (TRELEGY ELLIPTA) 100-62.5-25 MCG/INH AEPB Inhale 1 puff into the lungs daily. Rinse, gargle, and spit after use. 01/19/18  Yes Valentina Shaggy, MD  furosemide (LASIX) 40 MG tablet 40 mg. 09/08/16  Yes [provider]  hydrALAZINE (APRESOLINE) 50 MG tablet Take 1 tablet (50 mg total) by mouth 2 (two) times daily. 01/26/18 04/26/18 Yes Burtis Junes, NP  losartan (COZAAR) 100 MG tablet TAKE 1 TABLET (100 MG TOTAL) BY MOUTH DAILY. 12/25/17 03/25/18 Yes Burtis Junes, NP  losartan (COZAAR) 50 MG tablet 100 mg. 09/04/16  Yes [provider]  metFORMIN (GLUCOPHAGE) 500 MG tablet Take 500 mg by mouth 2 (two) times daily with a meal.  05/31/16  Yes [provider]  metFORMIN (GLUCOPHAGE) 500 MG tablet 500 mg. 09/04/16  Yes [provider]  metoprolol succinate (TOPROL-XL) 25 MG 24 hr tablet Take 1 tablet (25 mg total) by mouth daily. 03/11/17  Yes Sherren Mocha, MD  metoprolol succinate (TOPROL-XL) 25 MG 24 hr tablet  09/04/16  Yes [provider]  sertraline (ZOLOFT) 50 MG tablet Take 50 mg by mouth daily.     Yes [provider]  SPIRIVA HANDIHALER 18 MCG inhalation capsule Place 18 mcg into inhaler and inhale daily.  10/28/10  Yes [provider]  tiotropium (SPIRIVA HANDIHALER) 18 MCG inhalation capsule  08/13/16  Yes [provider]  torsemide (DEMADEX) 20 MG tablet Take 20 mg by mouth 2 (two) times daily.   Yes [provider]  colchicine 0.6 MG tablet Take 1 tablet (0.6 mg total) by mouth daily. 02/01/18   Davonna Belling, MD  EPINEPHrine (EPIPEN 2-PAK) 0.3 mg/0.3 mL IJ SOAJ injection Use as directed for severe allergic reaction 10/28/17   Valentina Shaggy, MD  predniSONE (DELTASONE) 20 MG tablet Take 2 tablets (40 mg total) by mouth daily. 02/01/18   Davonna Belling, MD  sertraline (ZOLOFT) 50 MG tablet  09/05/16   [provider]  traMADol (ULTRAM) 50 MG tablet Take 1 tablet (50 mg total) by mouth every  6 (six) hours as needed. 02/01/18   Davonna Belling, MD    Family History Family History  Problem Relation Age of Onset  . Liver disease Father   . Lymphoma Mother   . Diabetes Mother   . Hypertension Mother   . Cancer Mother   . Mental retardation Unknown        sibling -- from birth   . Asthma Daughter   . Allergic rhinitis Neg Hx   . Angioedema Neg Hx   . Atopy Neg Hx   . Eczema Neg Hx   . Immunodeficiency Neg Hx     Social History Social History   Tobacco Use  . Smoking status: Current Every Day Smoker    Packs/day: 2.00    Years: 50.00    Pack years: 100.00    Types: Cigarettes  . Smokeless tobacco: Never Used  . Tobacco comment: 2ppd 10/30/2016 ee  Substance Use Topics  . Alcohol use: Yes    Alcohol/week: 0.0 standard drinks    Comment: occasional  . Drug use: No     Allergies   No known allergies   Review of Systems Review of Systems  Constitutional: Negative for appetite change.  HENT: Negative for congestion.   Respiratory: Positive for shortness of breath.   Cardiovascular: Positive for leg swelling.    Gastrointestinal: Negative for abdominal pain.  Genitourinary: Negative for flank pain.  Musculoskeletal: Positive for joint swelling.  Skin: Negative for rash.  Neurological: Negative for numbness.  Hematological: Negative for adenopathy.     Physical Exam Updated Vital Signs BP (!) 114/96   Pulse (!) 59   Temp 97.9 F (36.6 C) (Oral)   Resp 20   Ht 5\' 10"  (1.778 m)   Wt 99.8 kg   SpO2 94%   BMI 31.57 kg/m   Physical Exam  Constitutional: He appears well-developed.  HENT:  Head: Normocephalic.  Eyes: Pupils are equal, round, and reactive to light.  Cardiovascular: Normal rate.  Pulmonary/Chest: He has wheezes.  Abdominal: There is no tenderness.  Musculoskeletal: He exhibits edema.  Pitting edema bilateral lower extremities.  Slight erythema without induration.  Right knee with effusion.  Some pain with movement.  No erythema.  No skin changes on the right knee.  Neurological: He is alert.  Skin: Skin is warm. Capillary refill takes less than 2 seconds.     ED Treatments / Results  Labs (all labs ordered are listed, but only abnormal results are displayed) Labs Reviewed  BASIC METABOLIC PANEL - Abnormal; Notable for the following components:      Result Value   Glucose, Bld 215 (*)    All other components within normal limits    EKG None  Radiology No results found.  Procedures Procedures (including critical care time)  Medications Ordered in ED Medications - No data to display   Initial Impression / Assessment and Plan / ED Course  I have reviewed the triage vital signs and the nursing notes.  Pertinent labs & imaging results that were available during my care of the patient were reviewed by me and considered in my medical decision making (see chart for details).    Patient with right-sided knee pain.  Likely gout.  Had already been on antibiotics for lower extremity infection.  I think infected knee is less likely.  Mild hyperglycemia though due  to get follow.  Will give steroids colchicine and some pain relief.  Drug database reviewed.  Follow-up with orthopedic surgery as needed.  Final Clinical Impressions(s) / ED Diagnoses   Final diagnoses:  Acute gout of right knee, unspecified cause    ED Discharge Orders         Ordered    colchicine 0.6 MG tablet  Daily     02/01/18 0844    predniSONE (DELTASONE) 20 MG tablet  Daily     02/01/18 0844    traMADol (ULTRAM) 50 MG tablet  Every 6 hours PRN     02/01/18 0844           Davonna Belling, MD 02/01/18 0900

## 2018-02-01 NOTE — ED Notes (Signed)
Patient given discharge instruction, verbalized understand. Patient wheelchair out of the department.  

## 2018-02-02 ENCOUNTER — Ambulatory Visit: Payer: Medicare HMO | Admitting: Orthopedic Surgery

## 2018-02-02 ENCOUNTER — Ambulatory Visit (INDEPENDENT_AMBULATORY_CARE_PROVIDER_SITE_OTHER): Payer: Medicare HMO | Admitting: *Deleted

## 2018-02-02 VITALS — BP 124/60 | HR 65 | Ht 70.0 in | Wt 235.0 lb

## 2018-02-02 DIAGNOSIS — M10262 Drug-induced gout, left knee: Secondary | ICD-10-CM

## 2018-02-02 DIAGNOSIS — J4540 Moderate persistent asthma, uncomplicated: Secondary | ICD-10-CM | POA: Diagnosis not present

## 2018-02-02 NOTE — Progress Notes (Signed)
Progress Note   Patient ID: Kevin Frank, male   DOB: 04-03-1941, 77 y.o.   MRN: [id number]   Chief Complaint  Patient presents with  . Knee Pain    ER follow up on right knee for gout.    HPI The patient presents for evaluation of right knee pain swelling  Location diffuse right knee Duration 1 week Quality ache dull Severity initially severe improved with colchicine tramadol and prednisone Associated with severe swelling and inability to weight-bear  The patient is since improved on the current medications of colchicine tramadol and prednisone  CBC    Component Value Date/Time   WBC 14.0 (H) 10/19/2017 1637   RBC 4.91 10/19/2017 1637   HGB 15.8 10/19/2017 1637   HCT 47.7 10/19/2017 1637   PLT 165 10/19/2017 1637   MCV 97.1 10/19/2017 1637   MCH 32.2 10/19/2017 1637   MCHC 33.1 10/19/2017 1637   RDW 14.3 10/19/2017 1637   LYMPHSABS 1.8 10/19/2017 1637   MONOABS 1.7 (H) 10/19/2017 1637   EOSABS 0.1 10/19/2017 1637   BASOSABS 0.0 10/19/2017 1637   BMP Latest Ref Rng & Units 02/01/2018 10/19/2017 07/04/2017  Glucose 70 - 99 mg/dL 215(H) 151(H) 141(H)  BUN 8 - 23 mg/dL 16 16 17   Creatinine 0.61 - 1.24 mg/dL 1.03 1.02 1.00  BUN/Creat Ratio 10 - 24 - - -  Sodium 135 - 145 mmol/L 136 138 140  Potassium 3.5 - 5.1 mmol/L 4.2 4.3 3.9  Chloride 98 - 111 mmol/L 101 101 103  CO2 22 - 32 mmol/L 25 25 -  Calcium 8.9 - 10.3 mg/dL 9.4 9.5 -     Review of Systems  Constitutional: Negative for chills and fever.  Skin: Negative.  Negative for itching and rash.  Neurological: Negative for sensory change.   No outpatient medications have been marked as taking for the 02/02/18 encounter (Office Visit) with Carole Civil, MD.   Current Facility-Administered Medications for the 02/02/18 encounter (Office Visit) with Carole Civil, MD  Medication  . ipratropium (ATROVENT) nebulizer solution 0.5 mg  . levalbuterol (XOPENEX) nebulizer solution 1.25 mg  . omalizumab  Arvid Right) injection 375 mg    Past Medical History:  Diagnosis Date  . AAA (abdominal aortic aneurysm) ([place]) 01/2008   3.2cm  . Asthma   . BPH (benign prostatic hyperplasia)   . Chronic knee pain    bilateral  . COPD (chronic obstructive pulmonary disease) ([place])   . Diabetes mellitus    type II  . Gout   . History of kidney stones   . Hypercholesterolemia   . Hyperlipidemia   . Hypertension   . Normal nuclear stress test 2011  . Obesity   . Obstructive sleep apnea on CPAP   . Presence of indwelling urinary catheter   . Shortness of breath    due to cigarette abuse  . Tobacco abuse      Allergies  Allergen Reactions  . No Known Allergies      BP 124/60   Pulse 65   Ht 5\' 10"  (1.778 m)   Wt 235 lb (106.6 kg)   BMI 33.72 kg/m    Physical Exam  Constitutional: He is oriented to person, place, and time. He appears well-developed and well-nourished.  Vital signs have been reviewed and are stable. Gen. appearance the patient is well-developed and well-nourished with normal grooming and hygiene.   Musculoskeletal:       Right knee: He exhibits no effusion.  Left knee: He exhibits no effusion.  Neurological: He is alert and oriented to person, place, and time.  Skin: Skin is warm and dry. No erythema.  Psychiatric: He has a normal mood and affect.  Vitals reviewed.   Right Knee Exam   Muscle Strength  The patient has normal right knee strength.  Tenderness  The patient is experiencing no tenderness.   Range of Motion  Extension:  5 normal  Flexion: normal   Tests  McMurray:  Medial - negative Lateral - negative Varus: negative Valgus: negative Drawer:  Anterior - negative    Posterior - negative  Other  Erythema: absent Scars: absent Sensation: normal Pulse: present Swelling: none Effusion: no effusion present   Left Knee Exam   Muscle Strength  The patient has normal left knee strength.  Tenderness  The patient is experiencing no  tenderness.   Range of Motion  Extension:  5 normal  Flexion: normal   Tests  McMurray:  Medial - negative Lateral - negative Varus: negative Valgus: negative Drawer:  Anterior - negative     Posterior - negative  Other  Erythema: absent Scars: absent Sensation: normal Pulse: present Swelling: none Effusion: no effusion present      Arther Abbott, MD   MEDICAL DECISION MAKING   Imaging:  None   NEW PROBLEM  Encounter Diagnosis  Name Primary?  . Acute drug-induced gout of left knee Yes     PLAN: (RX., injection, surgery,frx,mri/ct, XR 2 body ares) Continue the colchicine until its finished the prednisone and the tramadol follow-up in 2 weeks recheck knee   11:41 AM 02/02/2018

## 2018-02-04 ENCOUNTER — Ambulatory Visit (INDEPENDENT_AMBULATORY_CARE_PROVIDER_SITE_OTHER)
Admission: RE | Admit: 2018-02-04 | Discharge: 2018-02-04 | Disposition: A | Discharge status: Home / Self Care | Payer: Medicare HMO | Source: Ambulatory Visit | Attending: Nurse Practitioner | Admitting: Nurse Practitioner

## 2018-02-04 DIAGNOSIS — R918 Other nonspecific abnormal finding of lung field: Secondary | ICD-10-CM | POA: Diagnosis not present

## 2018-02-04 DIAGNOSIS — R0602 Shortness of breath: Secondary | ICD-10-CM | POA: Diagnosis not present

## 2018-02-04 MED ORDER — IOPAMIDOL (ISOVUE-370) INJECTION 76%
80.0000 mL | Freq: Once | INTRAVENOUS | Status: AC | PRN
  Administered 2018-02-04: 80 mL via INTRAVENOUS

## 2018-02-05 ENCOUNTER — Telehealth: Payer: Self-pay | Admitting: Nurse Practitioner

## 2018-02-05 NOTE — Telephone Encounter (Signed)
°  Please call Joy at Dr Luan Pulling office  Notes recorded by Theodoro Parma, RN on 02/05/2018 at 8:48 AM EDT Informed patient's wife (DPR) of results and verbal understanding expressed.  She prefers Dr. Luan Pulling place order for PET scan.  She understands Dr. Luan Pulling' office will be contacted and they will call to arrange testing.  Called Dr. Luan Pulling' office and left message with his nurse to call back and discuss results/further testing. Results faxed.

## 2018-02-05 NOTE — Telephone Encounter (Signed)
Pt's wife was notified of results yesterday.  I spoke with pt who reports he was just notified of this after he placed call this morning. I reviewed results with pt.  I told him results of CT had been faxed to Dr. Luan Pulling and his office would contact him to arrange PET Scan.

## 2018-02-05 NOTE — Telephone Encounter (Signed)
New Message:      Pt is returning a call for CT results.

## 2018-02-05 NOTE — Telephone Encounter (Signed)
Left message on Kevin Frank's voicemail regarding need for PET Scan based on CT results.  I left message that results of CT had been faxed to Dr. Luan Pulling' office. Left message to call back if any questions.

## 2018-02-12 ENCOUNTER — Other Ambulatory Visit (HOSPITAL_COMMUNITY): Payer: Self-pay | Admitting: Pulmonary Disease

## 2018-02-12 DIAGNOSIS — R911 Solitary pulmonary nodule: Secondary | ICD-10-CM

## 2018-02-15 ENCOUNTER — Ambulatory Visit (HOSPITAL_COMMUNITY)
Admission: RE | Admit: 2018-02-15 | Discharge: 2018-02-15 | Disposition: A | Discharge status: Home / Self Care | Payer: Medicare HMO | Source: Ambulatory Visit | Attending: Surgery | Admitting: Surgery

## 2018-02-15 ENCOUNTER — Other Ambulatory Visit: Payer: Self-pay

## 2018-02-15 ENCOUNTER — Encounter: Payer: Self-pay | Admitting: Surgery

## 2018-02-15 ENCOUNTER — Ambulatory Visit (INDEPENDENT_AMBULATORY_CARE_PROVIDER_SITE_OTHER): Payer: Medicare HMO | Admitting: Surgery

## 2018-02-15 ENCOUNTER — Ambulatory Visit (INDEPENDENT_AMBULATORY_CARE_PROVIDER_SITE_OTHER)
Admission: RE | Admit: 2018-02-15 | Discharge: 2018-02-15 | Disposition: A | Discharge status: Home / Self Care | Payer: Medicare HMO | Source: Ambulatory Visit | Attending: Surgery | Admitting: Surgery

## 2018-02-15 VITALS — BP 133/77 | HR 57 | Temp 97.4°F | Resp 20 | Ht 70.0 in | Wt 235.0 lb

## 2018-02-15 DIAGNOSIS — I701 Atherosclerosis of renal artery: Secondary | ICD-10-CM | POA: Diagnosis not present

## 2018-02-15 DIAGNOSIS — N281 Cyst of kidney, acquired: Secondary | ICD-10-CM | POA: Insufficient documentation

## 2018-02-15 DIAGNOSIS — I714 Abdominal aortic aneurysm, without rupture, unspecified: Secondary | ICD-10-CM

## 2018-02-15 NOTE — Progress Notes (Signed)
[facility] [place]  Patient name: Kevin Frank MRN: [id number] DOB: 08-Jul-1940 Sex: male   REASON FOR VISIT:    Follow up  [name]:    Kevin Frank a 77 y.o.malereturns today for his first postoperative follow-up. On 04/09/2016 he underwent FEVARwith a single rightrenal fenestration. I also performed left renal angioplasty at the time of the main body deployment service to ensure that the graft not cover the renal artery because of angulation. The day following his procedure, his creatinine increased and ultrasound did not show good perfusion to the left kidney and therefore he went to the Cath Lab and had stenting of his left renal artery. His creatinine trended down and his urine output improved. The patient also had issues with left leg numbness in his legs after the procedure. Neurology was consulted. It was felt this was most likely secondary to lower back disease that was aggravated during surgery.  He tells me that he has some ongoing cardiac issues as well as he being worked up for a lung nodule  PAST MEDICAL HISTORY:   Past Medical History:  Diagnosis Date  . AAA (abdominal aortic aneurysm) ([place]) 01/2008   3.2cm  . Asthma   . BPH (benign prostatic hyperplasia)   . Chronic knee pain    bilateral  . COPD (chronic obstructive pulmonary disease) ([place])   . Diabetes mellitus    type II  . Gout   . History of kidney stones   . Hypercholesterolemia   . Hyperlipidemia   . Hypertension   . Normal nuclear stress test 2011  . Obesity   . Obstructive sleep apnea on CPAP   . Presence of indwelling urinary catheter   . Shortness of breath    due to cigarette abuse  . Tobacco abuse      FAMILY HISTORY:   Family History  Problem Relation Age of Onset  . Liver disease Father   . Lymphoma Mother   . Diabetes Mother   . Hypertension Mother   . Cancer Mother   . Mental  retardation Unknown        sibling -- from birth   . Asthma Daughter   . Allergic rhinitis Neg Hx   . Angioedema Neg Hx   . Atopy Neg Hx   . Eczema Neg Hx   . Immunodeficiency Neg Hx     SOCIAL HISTORY:   Social History   Tobacco Use  . Smoking status: Current Every Day Smoker    Packs/day: 2.00    Years: 50.00    Pack years: 100.00    Types: Cigarettes  . Smokeless tobacco: Never Used  . Tobacco comment: 2ppd 10/30/2016 ee  Substance Use Topics  . Alcohol use: Yes    Alcohol/week: 0.0 standard drinks    Comment: occasional     ALLERGIES:   Allergies  Allergen Reactions  . No Known Allergies      CURRENT MEDICATIONS:   Current Outpatient Medications  Medication Sig Dispense Refill  . albuterol (PROVENTIL HFA;VENTOLIN HFA) 108 (90 BASE) MCG/ACT inhaler Inhale 2 puffs into the lungs every 6 (six) hours as needed (rescue inhaler).     [name] allopurinol (ZYLOPRIM) 300 MG tablet Take 300 mg by mouth daily.      [name] amLODipine (NORVASC) 10 MG tablet Take 1 tablet (10 mg total) by mouth daily. 90 tablet 3  . amLODipine (NORVASC) 10 MG tablet 10 mg.    . atorvastatin (LIPITOR) 20  MG tablet Take 20 mg by mouth daily.     [name] atorvastatin (LIPITOR) 20 MG tablet 20 mg.    . azelastine (ASTELIN) 0.1 % nasal spray Place 2 sprays into both nostrils 2 (two) times daily.    [name] azelastine (ASTELIN) 0.1 % nasal spray     . clopidogrel (PLAVIX) 75 MG tablet TAKE (1) TABLET BY MOUTH ONCE DAILY. 30 tablet 0  . clopidogrel (PLAVIX) 75 MG tablet 75 mg.    . colchicine 0.6 MG tablet Take 1 tablet (0.6 mg total) by mouth daily. 7 tablet 0  . Dextromethorphan-Guaifenesin (MUCINEX DM MAXIMUM STRENGTH) 60-1200 MG TB12 Take 2 tablets by mouth daily as needed (cough and congestion).    [name] doxycycline (MONODOX) 100 MG capsule Take 100 mg by mouth 2 (two) times daily.    [name] doxycycline (VIBRA-TABS) 100 MG tablet 100 mg.    . doxycycline (VIBRAMYCIN) 100 MG capsule Take 100 mg by mouth 2 (two) times daily.   0  . EPINEPHrine (EPIPEN 2-PAK) 0.3 mg/0.3 mL IJ SOAJ injection Use as directed for severe allergic reaction 2 Device 1  . Fluticasone-Umeclidin-Vilant (TRELEGY ELLIPTA) 100-62.5-25 MCG/INH AEPB Inhale 1 puff into the lungs daily. Rinse, gargle, and spit after use. 60 each 3  . furosemide (LASIX) 40 MG tablet 40 mg.    . hydrALAZINE (APRESOLINE) 50 MG tablet Take 1 tablet (50 mg total) by mouth 2 (two) times daily. 60 tablet 6  . losartan (COZAAR) 100 MG tablet TAKE 1 TABLET (100 MG TOTAL) BY MOUTH DAILY. 90 tablet 2  . losartan (COZAAR) 50 MG tablet 100 mg.    . metFORMIN (GLUCOPHAGE) 500 MG tablet Take 500 mg by mouth 2 (two) times daily with a meal.     . metFORMIN (GLUCOPHAGE) 500 MG tablet 500 mg.    . metoprolol succinate (TOPROL-XL) 25 MG 24 hr tablet Take 1 tablet (25 mg total) by mouth daily. 90 tablet 3  . metoprolol succinate (TOPROL-XL) 25 MG 24 hr tablet     . predniSONE (DELTASONE) 20 MG tablet Take 2 tablets (40 mg total) by mouth daily. 8 tablet 0  . sertraline (ZOLOFT) 50 MG tablet Take 50 mg by mouth daily.      . sertraline (ZOLOFT) 50 MG tablet     . SPIRIVA HANDIHALER 18 MCG inhalation capsule Place 18 mcg into inhaler and inhale daily.     [name] tiotropium (SPIRIVA HANDIHALER) 18 MCG inhalation capsule     . torsemide (DEMADEX) 20 MG tablet Take 20 mg by mouth 2 (two) times daily.    . traMADol (ULTRAM) 50 MG tablet Take 1 tablet (50 mg total) by mouth every 6 (six) hours as needed. 8 tablet 0   Current Facility-Administered Medications  Medication Dose Route Frequency Provider Last Rate Last Dose  . ipratropium (ATROVENT) nebulizer solution 0.5 mg  0.5 mg Nebulization Once Gean Quint, MD      . levalbuterol Penne Lash) nebulizer solution 1.25 mg  1.25 mg Nebulization Once Gean Quint, MD      . omalizumab Arvid Right) injection 375 mg  375 mg Subcutaneous Q14 Days Valentina Shaggy, MD   375 mg at 02/02/18 1036    REVIEW OF SYSTEMS:   [X]  denotes positive  finding, [ ]  denotes negative finding Cardiac  Comments:  Chest pain or chest pressure:    Shortness of breath upon exertion:    Short of breath when lying flat:    Irregular heart rhythm:  Vascular    Pain in calf, thigh, or hip brought on by ambulation:    Pain in feet at night that wakes you up from your sleep:     Blood clot in your veins:    Leg swelling:         Pulmonary    Oxygen at home:    Productive cough:     Wheezing:         Neurologic    Sudden weakness in arms or legs:     Sudden numbness in arms or legs:     Sudden onset of difficulty speaking or slurred speech:    Temporary loss of vision in one eye:     Problems with dizziness:         Gastrointestinal    Blood in stool:     Vomited blood:         Genitourinary    Burning when urinating:     Blood in urine:        Psychiatric    Major depression:         Hematologic    Bleeding problems:    Problems with blood clotting too easily:        Skin    Rashes or ulcers:        Constitutional    Fever or chills:      PHYSICAL EXAM:   There were no vitals filed for this visit.  GENERAL: The patient is a well-nourished male, in no acute distress. The vital signs are documented above. CARDIAC: There is a regular rate and rhythm.  VASCULAR: no carotid bruits PULMONARY: Non-labored respirations ABDOMEN: Soft and non-tender with normal pitched bowel sounds.  MUSCULOSKELETAL: There are no major deformities or cyanosis. NEUROLOGIC: No focal weakness or paresthesias are detected. SKIN: There are no ulcers or rashes noted. PSYCHIATRIC: The patient has a normal affect.  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  He will be scheduled for follow-up vascular lab studies in 1 year    Annamarie Major, MD [facility] [phone] Pager [phone]

## 2018-02-16 ENCOUNTER — Ambulatory Visit (INDEPENDENT_AMBULATORY_CARE_PROVIDER_SITE_OTHER): Payer: Medicare HMO | Admitting: *Deleted

## 2018-02-16 DIAGNOSIS — J4540 Moderate persistent asthma, uncomplicated: Secondary | ICD-10-CM

## 2018-02-17 ENCOUNTER — Ambulatory Visit: Payer: Medicare HMO | Admitting: Orthopedic Surgery

## 2018-02-26 DIAGNOSIS — R05 Cough: Secondary | ICD-10-CM | POA: Diagnosis not present

## 2018-03-02 ENCOUNTER — Ambulatory Visit (INDEPENDENT_AMBULATORY_CARE_PROVIDER_SITE_OTHER): Payer: Medicare HMO | Admitting: *Deleted

## 2018-03-02 DIAGNOSIS — J4540 Moderate persistent asthma, uncomplicated: Secondary | ICD-10-CM | POA: Diagnosis not present

## 2018-03-04 ENCOUNTER — Encounter: Payer: Self-pay | Admitting: Nurse Practitioner

## 2018-03-08 ENCOUNTER — Ambulatory Visit (HOSPITAL_COMMUNITY)
Admission: RE | Admit: 2018-03-08 | Discharge: 2018-03-08 | Disposition: A | Discharge status: Home / Self Care | Payer: Medicare HMO | Source: Ambulatory Visit | Attending: Pulmonary Disease | Admitting: Pulmonary Disease

## 2018-03-08 ENCOUNTER — Encounter (HOSPITAL_COMMUNITY): Payer: Medicare HMO

## 2018-03-08 DIAGNOSIS — J439 Emphysema, unspecified: Secondary | ICD-10-CM | POA: Insufficient documentation

## 2018-03-08 DIAGNOSIS — R911 Solitary pulmonary nodule: Secondary | ICD-10-CM | POA: Insufficient documentation

## 2018-03-08 DIAGNOSIS — K5730 Diverticulosis of large intestine without perforation or abscess without bleeding: Secondary | ICD-10-CM | POA: Insufficient documentation

## 2018-03-08 DIAGNOSIS — J320 Chronic maxillary sinusitis: Secondary | ICD-10-CM | POA: Insufficient documentation

## 2018-03-08 DIAGNOSIS — N400 Enlarged prostate without lower urinary tract symptoms: Secondary | ICD-10-CM | POA: Insufficient documentation

## 2018-03-08 DIAGNOSIS — I2510 Atherosclerotic heart disease of native coronary artery without angina pectoris: Secondary | ICD-10-CM | POA: Diagnosis not present

## 2018-03-08 MED ORDER — FLUDEOXYGLUCOSE F - 18 (FDG) INJECTION
13.5500 | Freq: Once | INTRAVENOUS | Status: AC | PRN
  Administered 2018-03-08: 13.55 via INTRAVENOUS

## 2018-03-09 ENCOUNTER — Ambulatory Visit: Payer: Medicare HMO | Admitting: Allergy & Immunology

## 2018-03-09 ENCOUNTER — Other Ambulatory Visit (HOSPITAL_COMMUNITY): Payer: Self-pay | Admitting: Pulmonary Disease

## 2018-03-09 DIAGNOSIS — J309 Allergic rhinitis, unspecified: Secondary | ICD-10-CM

## 2018-03-09 DIAGNOSIS — R911 Solitary pulmonary nodule: Secondary | ICD-10-CM

## 2018-03-09 DIAGNOSIS — I10 Essential (primary) hypertension: Secondary | ICD-10-CM | POA: Diagnosis not present

## 2018-03-09 DIAGNOSIS — Z23 Encounter for immunization: Secondary | ICD-10-CM | POA: Diagnosis not present

## 2018-03-09 DIAGNOSIS — J441 Chronic obstructive pulmonary disease with (acute) exacerbation: Secondary | ICD-10-CM | POA: Diagnosis not present

## 2018-03-09 DIAGNOSIS — F17200 Nicotine dependence, unspecified, uncomplicated: Secondary | ICD-10-CM | POA: Diagnosis not present

## 2018-03-16 ENCOUNTER — Ambulatory Visit: Payer: Medicare HMO | Admitting: Allergy & Immunology

## 2018-03-16 ENCOUNTER — Encounter: Payer: Self-pay | Admitting: Allergy & Immunology

## 2018-03-16 ENCOUNTER — Ambulatory Visit: Payer: Medicare HMO

## 2018-03-16 VITALS — BP 110/62 | HR 82 | Temp 97.5°F | Ht 69.69 in | Wt 235.8 lb

## 2018-03-16 DIAGNOSIS — J3089 Other allergic rhinitis: Secondary | ICD-10-CM | POA: Diagnosis not present

## 2018-03-16 DIAGNOSIS — R918 Other nonspecific abnormal finding of lung field: Secondary | ICD-10-CM

## 2018-03-16 DIAGNOSIS — J302 Other seasonal allergic rhinitis: Secondary | ICD-10-CM

## 2018-03-16 DIAGNOSIS — F17200 Nicotine dependence, unspecified, uncomplicated: Secondary | ICD-10-CM | POA: Diagnosis not present

## 2018-03-16 DIAGNOSIS — J449 Chronic obstructive pulmonary disease, unspecified: Secondary | ICD-10-CM

## 2018-03-16 DIAGNOSIS — J4540 Moderate persistent asthma, uncomplicated: Secondary | ICD-10-CM

## 2018-03-16 NOTE — Progress Notes (Signed)
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 Asthma-COPD overlap syndrome - Lung function looks much better than before (better than it has ever looked since I started seeing you). - Your lung function looks better than Michelle's!  - I think the Xolair is doing a good job with managing your medications.  - Daily controller medication(s): Trelegy 100/62.5/25 one puff once daily + Xolair every two weeks - Prior to physical activity: ProAir 2 puffs 10-15 minutes before physical activity. - Rescue medications: ProAir 4 puffs every 4-6 hours as needed or albuterol nebulizer one vial every 4-6 hours as needed - Asthma control goals:  * Full participation in all desired activities (may need albuterol before activity) * Albuterol use two time or less a week on average (not counting use with activity) * Cough interfering with sleep two time or less a month * Oral steroids no more than once a year * No hospitalizations - Good luck with the biopsy this week (I am cautiously optimistic).   2. Chronic rhinitis (cat, dog, cockroach, trees, weeds, and ragweed) - Continue with nasal saline rinses 1-2 times daily to try to heal your nose and prevent more bleeds. - Use an antihistamine as needed (Allegra, Zyrtec, Claritin).   3. Return in about 3 months (around 06/15/2018).  Subjective:   Kevin Frank is a 77 y.o. male presenting today for follow up of  Chief Complaint  Patient presents with  . Follow-up    Kevin Frank has a history of the following: Patient  Active Problem List   Diagnosis Date Noted  . Seasonal and perennial allergic rhinitis 09/22/2017  . COPD with asthma ([place]) 09/22/2017  . Obstructive sleep apnea on CPAP 04/23/2017  . Chronic knee pain 04/23/2017  . AKI (acute kidney injury) ([place]) 04/23/2017  . Chronic diastolic CHF (congestive heart failure) ([place]) 04/23/2017  . Cellulitis 04/23/2017  . Cellulitis of left lower leg   . Smoking greater than 40 pack years 03/05/2016  . Incidental lung nodule, > 22mm and < 39mm 03/05/2016  . Stage 2 moderate COPD by GOLD classification ([place]) 03/05/2016  . AAA (abdominal aortic aneurysm) without rupture ([place]) 06/14/2015  . OBSTRUCTIVE SLEEP APNEA 06/09/2007  . HYPERCHOLESTEROLEMIA 05/06/2007  . GOUT 05/06/2007  . DEPRESSION 05/06/2007  . Hypertension, essential 05/06/2007  . ABDOMINAL AORTIC ANEURYSM 05/06/2007  . BENIGN PROSTATIC HYPERTROPHY, HX OF 05/06/2007    History obtained from: chart review and patient.  Kevin Frank Primary Care Provider is Sinda Du, MD.     [name] is a 77 y.o. male presenting for a follow up visit. He has a history of asthma-COPD overlap syndrome,  Kevin Frank was last seen in June 2019. At that time, he was having more difficulty breathing.  At that time, we continued him on Trelegy 1 puff daily and Xolair every 2 weeks.  I recommended that he start his day with 2 to 4 puffs of albuterol before he gets going.  I also recommended that he touch base with his cardiology team to see if something else needs to happen from their perspective since I did not feel  that his current symptoms were consistent with asthma.  He does have a history of perennial and seasonal allergic rhinitis with sensitizations to trees, weeds, ragweed, cat, dog, and cockroach.  We continued with nasal saline rinses.  He avoids nasal steroids due to his history of epistaxis.  We also recommended the use of an antihistamine as needed.    In the interim, he has gotten back on his Xolair every  2 weeks.  He also had a chest CT performed in August that showed an irregular nodular opacity in the posterior segment of the right upper lobe concerning for neoplasm.  He did have a PET scan performed in mid September that showed that the lesion was highly suspicious for malignancy without adenopathy or metastatic spread.  He is scheduled for a biopsy later this week.   Since the last visit, he has done very well. He feels that his breathing is improved with the use of the Xolair. He reports good compliance with his Trelegy and has been decreasing the use of his albuterol inhaler. Jourdon's asthma has been well controlled. He has not required rescue medication, experienced nocturnal awakenings due to lower respiratory symptoms, nor have activities of daily living been limited. He has required no Emergency Department or Urgent Care visits for his asthma. He has required zero courses of systemic steroids for asthma exacerbations since the last visit. ACT score today is 19, indicating excellent asthma symptom control.   He remains on the nasal saline rinses. He does not use nasal steroids at all. His activity level remains excellent and he has been doing a lot of renovations on his property. He is planning his next big party and wants the entire [facility]ergy [facility] Ast[facility] Crew to come.   Otherwise, there have been no changes to his past medical history, surgical history, family history, or social history.    Review of Systems: a 14-point review of systems is pertinent for what is mentioned in HPI.  Otherwise, all other systems were negative. Constitutional: negative other than that listed in the HPI Eyes: negative other than that listed in the HPI Ears, nose, mouth, throat, and face: negative other than that listed in the HPI Respiratory: negative other than that listed in the HPI Cardiovascular: negative other than that listed in the HPI Gastrointestinal: negative other than that listed in the  HPI Genitourinary: negative other than that listed in the HPI Integument: negative other than that listed in the HPI Hematologic: negative other than that listed in the HPI Musculoskeletal: negative other than that listed in the HPI Neurological: negative other than that listed in the HPI Allergy/Immunologic: negative other than that listed in the HPI    Objective:   Blood pressure 110/62, pulse 82, temperature (!) 97.5 F (36.4 C), temperature source Oral, height 5' 9.69" (1.77 m), weight 235 lb 12.8 oz (107 kg), SpO2 95 %. Body mass index is 34.14 kg/m.   Physical Exam:  General: Alert, interactive, in no acute distress. Boisterous male.  Eyes: No conjunctival injection bilaterally, no discharge on the right, no discharge on the left and no Horner-Trantas dots present. PERRL bilaterally. EOMI without pain. No photophobia.  Ears: Right TM pearly gray with normal light reflex, Left TM pearly gray with normal light reflex, Right TM intact without perforation and Left TM intact without perforation.  Nose/Throat: External nose within normal limits and septum midline. Turbinates edematous with clear discharge. Posterior oropharynx mildly erythematous without cobblestoning in the posterior oropharynx. Tonsils 2+ without exudates.  Tongue without thrush. Lungs: Clear to auscultation without wheezing, rhonchi or rales. No increased work of breathing. CV: Normal S1/S2. No murmurs. Capillary refill <2 seconds.  Skin: Warm and dry, without lesions or rashes. Neuro:   Grossly intact. No focal deficits appreciated. Responsive to questions.  Diagnostic studies:   Spirometry: results normal (FEV1: 2.28/87%, FVC: 3.07/76%, FEV1/FVC: 74%).    Spirometry consistent with normal pattern.   Allergy Studies: none     Salvatore Marvel, MD  [facility]

## 2018-03-16 NOTE — Patient Instructions (Addendum)
1. Asthma-COPD overlap syndrome - Lung function looks much better than before (better than it has ever looked since I started seeing you). - Your lung function looks better than Michelle's!  - I think the Xolair is doing a good job with managing your medications.  - Daily controller medication(s): Trelegy 100/62.5/25 one puff once daily + Xolair every two weeks - Prior to physical activity: ProAir 2 puffs 10-15 minutes before physical activity. - Rescue medications: ProAir 4 puffs every 4-6 hours as needed or albuterol nebulizer one vial every 4-6 hours as needed - Asthma control goals:  * Full participation in all desired activities (may need albuterol before activity) * Albuterol use two time or less a week on average (not counting use with activity) * Cough interfering with sleep two time or less a month * Oral steroids no more than once a year * No hospitalizations - Good luck with the biopsy this week (I am cautiously optimistic).   2. Chronic rhinitis (cat, dog, cockroach, trees, weeds, and ragweed) - Continue with nasal saline rinses 1-2 times daily to try to heal your nose and prevent more bleeds. - Use an antihistamine as needed (Allegra, Zyrtec, Claritin).   3. Return in about 3 months (around 06/15/2018).   Please inform us of any Emergency Department visits, hospitalizations, or changes in symptoms. Call us before going to the ED for breathing or allergy symptoms since we might be able to fit you in for a sick visit. Feel free to contact us anytime with any questions, problems, or concerns.  It was a pleasure to see you again today!   Websites that have reliable patient information: 1. American Academy of Asthma, Allergy, and Immunology: www.aaaai.org 2. Food Allergy Research and Education (FARE): foodallergy.org 3. Mothers of Asthmatics: http://www.asthmacommunitynetwork.org 4. American College of Allergy, Asthma, and Immunology: www.acaai.org

## 2018-03-17 ENCOUNTER — Ambulatory Visit: Payer: Medicare HMO | Admitting: Nurse Practitioner

## 2018-03-17 ENCOUNTER — Encounter: Payer: Self-pay | Admitting: Nurse Practitioner

## 2018-03-17 ENCOUNTER — Ambulatory Visit: Payer: Medicare HMO | Admitting: Orthopedic Surgery

## 2018-03-17 VITALS — BP 120/80 | HR 46 | Ht 70.0 in | Wt 235.4 lb

## 2018-03-17 DIAGNOSIS — F17200 Nicotine dependence, unspecified, uncomplicated: Secondary | ICD-10-CM | POA: Diagnosis not present

## 2018-03-17 DIAGNOSIS — E7800 Pure hypercholesterolemia, unspecified: Secondary | ICD-10-CM

## 2018-03-17 DIAGNOSIS — R911 Solitary pulmonary nodule: Secondary | ICD-10-CM

## 2018-03-17 DIAGNOSIS — R918 Other nonspecific abnormal finding of lung field: Secondary | ICD-10-CM

## 2018-03-17 DIAGNOSIS — I10 Essential (primary) hypertension: Secondary | ICD-10-CM | POA: Diagnosis not present

## 2018-03-17 DIAGNOSIS — I5032 Chronic diastolic (congestive) heart failure: Secondary | ICD-10-CM | POA: Diagnosis not present

## 2018-03-17 NOTE — Patient Instructions (Addendum)
We will be checking the following labs today - NONE   Medication Instructions:    Continue with your current medicines.     Testing/Procedures To Be Arranged:  N/A  Follow-Up:   See me tentatively in 3 months    Other Special Instructions:   If your biopsy shows cancer - request Dr. Earlie Server at [facility] for oncology and if you need surgery you can ask for Dr. Servando Snare at [facility]. If you need to have lung surgery you will need a clearance from me and we will need to do a stress test.     If you need a refill on your cardiac medications before your next appointment, please call your pharmacy.   Call the [place] office at [phone] if you have any questions, problems or concerns.

## 2018-03-17 NOTE — Progress Notes (Signed)
CARDIOLOGY OFFICE NOTE  Date:  03/17/2018    Kevin Frank Date of Birth: 24-Mar-1941 Medical Record #[id number]  PCP:  Sinda Du, MD  Cardiologist:  Jerel Shepherd  Chief Complaint  Patient presents with  . Hypertension  . Hyperlipidemia  . Shortness of Breath    Follow up visit - seen for Dr. Burt Knack    History of Present Illness: Kevin Frank is a 77 y.o. male who presents today for a follow up visit. Seen for Dr. Burt Knack. He is a former patient of Dr. Susa Simmonds.   He has a history of hypertension, hyperlipidemia, DM, tobacco abuse, COPD, and abdominal aortic aneurysm.On 04/09/2016 he underwent FEVARwith a single rightrenal fenestration by Dr. Trula Slade. He also had left renal angioplasty at the time of the main body deployment service to ensure that the graft not cover the renal artery because of angulation. The day following his procedure, his creatinine increased and ultrasound did not show good perfusion to the left kidney and therefore he went to the Cath Lab and had stenting of his left renal artery. His creatinine trended down and his urine output improved. The patient also had issues with left leg numbness in his legs after the procedure. Neurology was consulted. It was felt this was most likely secondary to lower back disease that was aggravated during surgery.  He continues to smoke cigarettes. He has not had any known CAD symptoms but has had prior CT scan showing left main and 3VD. He has been on chronic Plavix. He has had lung nodules noted as well.   I have seen him over the past several years - he is not interested in stopping smoking. Meds have been adjusted for his BP.He has had some stress with legal issues over the last few times seen.  He called here in June with shortness of breath - I offered to see him sooner but he declined. I then saw him in August - he had had more swelling - did not know his medicines - lots of salt - still  smoking - more shortness of breath. I increased his hydralazine. We got his echo updated - this showed worsening pulmonary HTN - referred for CT - probable lung mass - referred on for PET which was abnormal and now has biopsy pending.    Comes in today. Herewith his wife today. Having his lung biopsy this Friday - he has been holding his Plavix since Saturday. He is still smoking. BP has improved. No chest pain. Breathing is stable by his report. He tells me "it is what it is". Continues to smoke - says he is "eating them".   Past Medical History:  Diagnosis Date  . AAA (abdominal aortic aneurysm) ([place]) 01/2008   3.2cm  . Asthma   . BPH (benign prostatic hyperplasia)   . Chronic knee pain    bilateral  . COPD (chronic obstructive pulmonary disease) ([place])   . Diabetes mellitus    type II  . Gout   . History of kidney stones   . Hypercholesterolemia   . Hyperlipidemia   . Hypertension   . Normal nuclear stress test 2011  . Obesity   . Obstructive sleep apnea on CPAP   . Presence of indwelling urinary catheter   . Shortness of breath    due to cigarette abuse  . Tobacco abuse     Past Surgical History:  Procedure Laterality Date  . ABDOMINAL AORTIC ENDOVASCULAR FENESTRATED STENT GRAFT N/A  04/09/2016   Procedure: ABDOMINAL AORTIC ENDOVASCULAR FENESTRATED STENT GRAFT with left brachial artery access using ultrasound guidance;  Surgeon: Serafina Mitchell, MD;  Location: [facility];  Service: Vascular;  Laterality: N/A;  . [place]   [place]  . PERIPHERAL VASCULAR CATHETERIZATION N/A 04/10/2016   Procedure: Renal Angiography;  Surgeon: Angelia Mould, MD;  Location: [facility];  Service: Cardiovascular;  Laterality: N/A;  . TRANSURETHRAL RESECTION OF PROSTATE  01/07/2011   Procedure: TRANSURETHRAL RESECTION OF THE PROSTATE (TURP);  Surgeon: Marissa Nestle;  Location: [facility];  Service: Urology;  Laterality: N/A;     Medications: Current Meds  Medication Sig    . albuterol (PROVENTIL HFA;VENTOLIN HFA) 108 (90 BASE) MCG/ACT inhaler Inhale 2 puffs into the lungs every 6 (six) hours as needed (rescue inhaler).   [name] allopurinol (ZYLOPRIM) 300 MG tablet Take 300 mg by mouth daily.    [name] amLODipine (NORVASC) 10 MG tablet Take 1 tablet (10 mg total) by mouth daily.  [name] atorvastatin (LIPITOR) 20 MG tablet Take 20 mg by mouth daily.   [name] azelastine (ASTELIN) 0.1 % nasal spray Place 1 spray into both nostrils 2 (two) times daily. Use in each nostril as directed  . clopidogrel (PLAVIX) 75 MG tablet TAKE (1) TABLET BY MOUTH ONCE DAILY. (Patient taking differently: Take 75 mg by mouth daily. )  . Fluticasone-Umeclidin-Vilant (TRELEGY ELLIPTA) 100-62.5-25 MCG/INH AEPB Inhale into the lungs.  . hydrALAZINE (APRESOLINE) 50 MG tablet Take 1 tablet (50 mg total) by mouth 2 (two) times daily.  [name] losartan (COZAAR) 100 MG tablet TAKE 1 TABLET (100 MG TOTAL) BY MOUTH DAILY.  . metFORMIN (GLUCOPHAGE) 500 MG tablet Take 500 mg by mouth 2 (two) times daily with a meal.   . metoprolol succinate (TOPROL-XL) 25 MG 24 hr tablet Take 1 tablet (25 mg total) by mouth daily.  . sertraline (ZOLOFT) 50 MG tablet Take 50 mg by mouth daily.    [name] torsemide (DEMADEX) 20 MG tablet Take 40 mg by mouth daily.    Current Facility-Administered Medications for the 03/17/18 encounter (Office Visit) with Burtis Junes, NP  Medication  . ipratropium (ATROVENT) nebulizer solution 0.5 mg  . levalbuterol (XOPENEX) nebulizer solution 1.25 mg  . omalizumab Arvid Right) injection 375 mg     Allergies: Allergies  Allergen Reactions  . No Known Allergies     Social History: The patient  reports that he has been smoking cigarettes. He has a 100.00 pack-year smoking history. He has never used smokeless tobacco. He reports that he drinks alcohol. He reports that he does not use drugs.   Family History: The patient's family history includes Asthma in his daughter; Cancer in his mother; Diabetes in his  mother; Hypertension in his mother; Liver disease in his father; Lymphoma in his mother; Mental retardation in his unknown relative.   Review of Systems: Please see the history of present illness.   Otherwise, the review of systems is positive for none.   All other systems are reviewed and negative.   Physical Exam: VS:  BP 120/80 (BP Location: Left Arm, Patient Position: Sitting, Cuff Size: Normal)   Pulse (!) 46   Ht 5\' 10"  (1.778 m)   Wt 235 lb 6.4 oz (106.8 kg)   SpO2 93%   BMI 33.78 kg/m  .  BMI Body mass index is 33.78 kg/m.  Wt Readings from Last 3 Encounters:  03/17/18 235 lb 6.4 oz (106.8 kg)  03/16/18 235 lb 12.8 oz (  107 kg)  02/15/18 235 lb (106.6 kg)    General: Alert and in no acute distress.  He has lost 5 pounds since I saw him. Smells of tobacco. He has a pack of cigarettes in his pocket today.  HEENT: Normal.  Neck: Supple, no JVD, carotid bruits, or masses noted.  Cardiac: Regular rate and rhythm. His HR is 68 by my count. Heart tones are quite distant.  No edema today.  Respiratory:  Lungs are coarse with normal work of breathing.  GI: Soft and nontender.  MS: No deformity or atrophy. Gait and ROM intact.  Skin: Warm and dry. Color is normal.  Neuro:  Strength and sensation are intact and no gross focal deficits noted.  Psych: Alert, appropriate and with normal affect.   LABORATORY DATA:  EKG:  EKG is ordered today. This shows NSR with RSR'  Lab Results  Component Value Date   WBC 14.0 (H) 10/19/2017   HGB 15.8 10/19/2017   HCT 47.7 10/19/2017   PLT 165 10/19/2017   GLUCOSE 215 (H) 02/01/2018   CHOL 138 12/16/2010   TRIG 53.0 12/16/2010   HDL 46.10 12/16/2010   LDLCALC 81 12/16/2010   ALT 23 04/02/2016   AST 18 04/02/2016   NA 136 02/01/2018   K 4.2 02/01/2018   CL 101 02/01/2018   CREATININE 1.03 02/01/2018   BUN 16 02/01/2018   CO2 25 02/01/2018   INR 1.08 04/09/2016   HGBA1C 6.0 (H) 04/02/2016       BNP (last 3 results) No results  for input(s): BNP in the last 8760 hours.  ProBNP (last 3 results) Recent Labs    01/26/18 0919  PROBNP 161     Other Studies Reviewed Today:  Echo Study Conclusions 01/2018  - Left ventricle: The cavity size was normal. There was mild   concentric hypertrophy. Systolic function was normal. The   estimated ejection fraction was in the range of 55% to 60%. Wall   motion was normal; there were no regional wall motion   abnormalities. Features are consistent with a pseudonormal left   ventricular filling pattern, with concomitant abnormal relaxation   and increased filling pressure (grade 2 diastolic dysfunction). - Aortic valve: Sclerosis without stenosis. There was no   regurgitation. - Mitral valve: There was trivial regurgitation. - Left atrium: The atrium was mildly dilated. - Right ventricle: The cavity size was moderately dilated. Wall   thickness was normal. Systolic function was normal. RV systolic   pressure (S, est): 53 mm Hg. - Atrial septum: No defect or patent foramen ovale was identified. - Tricuspid valve: There was trivial regurgitation. - Pulmonic valve: There was no significant regurgitation. - Pulmonary arteries: Systolic pressure was moderately to severely   increased. PA peak pressure: 53 mm Hg (S).  Impressions:  - Normal LV systolic function with grade II diastolic dysfunction.   Elevated right sided filling pressures, consistent with pulmonary   hypertension.   Assessment/Plan:  PET IMPRESSION 02/2018: 1. The 2.4 by 0.8 cm apical segment right upper lobe nodule has persisted over the last month and has a maximum SUV of 2.9, highly suspicious for malignancy. No adenopathy or metastatic spread is identified. 2. Other imaging findings of potential clinical significance: Aortic Atherosclerosis (ICD10-I70.0) and Emphysema (ICD10-J43.9). Coronary atherosclerosis. Airway thickening is present, suggesting bronchitis or reactive airways disease.  Chronic bilateral maxillary sinusitis. Mild prostatomegaly. Aorta bi-iliac stent graft. Sigmoid colon diverticulosis.   Electronically Signed   By: Cindra Eves.D.  On: 03/09/2018 08:51     CTA CHEST IMPRESSION 01/2018: 1. Somewhat irregular nodular opacity in the posterior segment of the right upper lobe measuring 2.0 x 1.0 x 0.9 cm. Small neoplasm must be of concern. Advise correlation with nuclear medicine PET study to further assess.  2. No demonstrable pulmonary embolus. No thoracic aortic aneurysm or dissection. There are foci of aortic atherosclerosis as well as coronary artery and great vessel calcification.  3. Underlying centrilobular emphysematous change. Atelectasis evident in both lower lobes and posterior segment left upper lobe. No frank consolidation noted.  4.  No evident thoracic adenopathy.  5. Adrenal hypertrophy bilaterally, more notable on the left than on the right.  6.  Absent gallbladder.  These results will be called to the ordering clinician or representative by the Radiologist Assistant, and communication documented in the PACS or zVision Dashboard.  Aortic Atherosclerosis (ICD10-I70.0) and Emphysema (ICD10-J43.9).   Electronically Signed   By: Lowella Grip III M.D.   On: 02/04/2018 11:12    Myoview Study Highlights from 02/2016   Nuclear stress EF: 58%.  There was no ST segment deviation noted during stress.  The study is normal.  This is a low risk study.    Echo Study Conclusions from 02/2016  - Left ventricle: The cavity size was normal. Wall thickness was increased in a pattern of mild LVH. Systolic function was normal. The estimated ejection fraction was in the range of 60% to 65%. Wall motion was normal; there were no regional wall motion abnormalities. Features are consistent with a pseudonormal left ventricular filling pattern, with concomitant abnormal relaxation and  increased filling pressure (grade 2 diastolic dysfunction). Doppler parameters are consistent with high ventricular filling pressure. - Mitral valve: There was mild regurgitation. - Left atrium: The atrium was moderately dilated. - Pulmonary arteries: Systolic pressure was mildly increased. PA peak pressure: 37 mm Hg (S).  Impressions:  - Normal LV systolic function; grade 2 diastolic dysfunction with elevated LV filling pressure; mild MR; moderate LAE; trace TR with mildly elevated pulmonary pressure.  CT ANGIO CHEST/ABD/PELVIS IMPRESSION: 1. Patent suprarenal bifurcated aortic stent graft without endoleak, native sac diameter 5.3 cm. 2. Coronary and Aortic Atherosclerosis (ICD10-170.0) without dissection. 3. Descending and sigmoid diverticulosis.   Electronically Signed By: Lucrezia Europe M.D. On: 05/16/2016 11:02    Assessment/Plan:  1. HTN - Hydralazine was increased at last visit - BP is good on his current regimen. No changes today.   2. Worsening SOB - now with probable lung cancer - biopsy later this week. Holding Plavix. He is asking about what his options are. Discussed at length. He wishes to see Dr. Servando Snare if surgery is needed.   3. HLD - on statin - not discussed today.   4. Long standing tobacco - still smoking - this was discussed at length - I do not get the feeling that he is ready to stop.   5. Prior AAA/FEVAR repair with stenting of the left renal artery - he follows with [facility].   6. Chronic diastolic HF- looks better.   7. Presumed CAD - no active symptoms - has been seen on prior scans - he has been maintained on Plavix. He will need a cardiac clearance if surgery is warranted. We discussed this today.  We will be on the lookout for his biopsy results. Further disposition pending.   Current medicines are reviewed with the patient today.  The patient does not have concerns regarding medicines other than what has been noted  above.  The following changes have been made:  See above.  Labs/ tests ordered today include:    Orders Placed This Encounter  Procedures  . EKG 12-Lead     Disposition:   FU with me tentatively in 3 months.   Patient is agreeable to this plan and will call if any problems develop in the interim.   SignedTruitt Merle, NP  03/17/2018 9:41 AM  [facility] [address] [place], [place]  [address] Phone: [phone] Fax: [phone]

## 2018-03-18 ENCOUNTER — Other Ambulatory Visit: Payer: Self-pay | Admitting: Radiology

## 2018-03-18 NOTE — Addendum Note (Signed)
Encounter addended by: Kathee Polite, RVT, RDCS on: 03/18/2018 9:52 AM  Actions taken: Imaging Exam ended

## 2018-03-19 ENCOUNTER — Ambulatory Visit (HOSPITAL_COMMUNITY)
Admission: RE | Admit: 2018-03-19 | Discharge: 2018-03-19 | Disposition: A | Discharge status: Home / Self Care | Payer: Medicare HMO | Source: Ambulatory Visit | Attending: Pulmonary Disease | Admitting: Pulmonary Disease

## 2018-03-19 ENCOUNTER — Ambulatory Visit (HOSPITAL_COMMUNITY)
Admission: RE | Admit: 2018-03-19 | Discharge: 2018-03-19 | Disposition: A | Discharge status: Home / Self Care | Payer: Medicare HMO | Source: Ambulatory Visit | Attending: Interventional Radiology | Admitting: Interventional Radiology

## 2018-03-19 DIAGNOSIS — Z8379 Family history of other diseases of the digestive system: Secondary | ICD-10-CM | POA: Insufficient documentation

## 2018-03-19 DIAGNOSIS — E7800 Pure hypercholesterolemia, unspecified: Secondary | ICD-10-CM | POA: Insufficient documentation

## 2018-03-19 DIAGNOSIS — Z8249 Family history of ischemic heart disease and other diseases of the circulatory system: Secondary | ICD-10-CM | POA: Insufficient documentation

## 2018-03-19 DIAGNOSIS — Z6833 Body mass index (BMI) 33.0-33.9, adult: Secondary | ICD-10-CM | POA: Insufficient documentation

## 2018-03-19 DIAGNOSIS — I10 Essential (primary) hypertension: Secondary | ICD-10-CM | POA: Diagnosis not present

## 2018-03-19 DIAGNOSIS — C3411 Malignant neoplasm of upper lobe, right bronchus or lung: Secondary | ICD-10-CM | POA: Diagnosis not present

## 2018-03-19 DIAGNOSIS — Z807 Family history of other malignant neoplasms of lymphoid, hematopoietic and related tissues: Secondary | ICD-10-CM | POA: Insufficient documentation

## 2018-03-19 DIAGNOSIS — Z7984 Long term (current) use of oral hypoglycemic drugs: Secondary | ICD-10-CM | POA: Insufficient documentation

## 2018-03-19 DIAGNOSIS — J449 Chronic obstructive pulmonary disease, unspecified: Secondary | ICD-10-CM | POA: Insufficient documentation

## 2018-03-19 DIAGNOSIS — R911 Solitary pulmonary nodule: Secondary | ICD-10-CM

## 2018-03-19 DIAGNOSIS — E119 Type 2 diabetes mellitus without complications: Secondary | ICD-10-CM | POA: Diagnosis not present

## 2018-03-19 DIAGNOSIS — F17210 Nicotine dependence, cigarettes, uncomplicated: Secondary | ICD-10-CM | POA: Diagnosis not present

## 2018-03-19 DIAGNOSIS — Z95828 Presence of other vascular implants and grafts: Secondary | ICD-10-CM | POA: Insufficient documentation

## 2018-03-19 DIAGNOSIS — Z7902 Long term (current) use of antithrombotics/antiplatelets: Secondary | ICD-10-CM | POA: Diagnosis not present

## 2018-03-19 DIAGNOSIS — Z7951 Long term (current) use of inhaled steroids: Secondary | ICD-10-CM | POA: Diagnosis not present

## 2018-03-19 DIAGNOSIS — E669 Obesity, unspecified: Secondary | ICD-10-CM | POA: Diagnosis not present

## 2018-03-19 DIAGNOSIS — Z87442 Personal history of urinary calculi: Secondary | ICD-10-CM | POA: Insufficient documentation

## 2018-03-19 DIAGNOSIS — Z9049 Acquired absence of other specified parts of digestive tract: Secondary | ICD-10-CM | POA: Insufficient documentation

## 2018-03-19 DIAGNOSIS — M109 Gout, unspecified: Secondary | ICD-10-CM | POA: Diagnosis not present

## 2018-03-19 DIAGNOSIS — J984 Other disorders of lung: Secondary | ICD-10-CM | POA: Diagnosis not present

## 2018-03-19 DIAGNOSIS — Z833 Family history of diabetes mellitus: Secondary | ICD-10-CM | POA: Insufficient documentation

## 2018-03-19 DIAGNOSIS — Z9889 Other specified postprocedural states: Secondary | ICD-10-CM

## 2018-03-19 DIAGNOSIS — R918 Other nonspecific abnormal finding of lung field: Secondary | ICD-10-CM | POA: Diagnosis present

## 2018-03-19 DIAGNOSIS — Z79899 Other long term (current) drug therapy: Secondary | ICD-10-CM | POA: Diagnosis not present

## 2018-03-19 DIAGNOSIS — G4733 Obstructive sleep apnea (adult) (pediatric): Secondary | ICD-10-CM | POA: Diagnosis not present

## 2018-03-19 LAB — CBC WITH DIFFERENTIAL/PLATELET
ABS IMMATURE GRANULOCYTES: 0.2 10*3/uL — AB (ref 0.0–0.1)
BASOS PCT: 1 %
Basophils Absolute: 0.1 10*3/uL (ref 0.0–0.1)
Eosinophils Absolute: 0.1 10*3/uL (ref 0.0–0.7)
Eosinophils Relative: 1 %
HCT: 48.9 % (ref 39.0–52.0)
Hemoglobin: 16.6 g/dL (ref 13.0–17.0)
IMMATURE GRANULOCYTES: 1 %
LYMPHS ABS: 2.6 10*3/uL (ref 0.7–4.0)
Lymphocytes Relative: 21 %
MCH: 32.7 pg (ref 26.0–34.0)
MCHC: 33.9 g/dL (ref 30.0–36.0)
MCV: 96.4 fL (ref 78.0–100.0)
MONOS PCT: 7 %
Monocytes Absolute: 0.9 10*3/uL (ref 0.1–1.0)
NEUTROS ABS: 8.5 10*3/uL — AB (ref 1.7–7.7)
NEUTROS PCT: 69 %
PLATELETS: 160 10*3/uL (ref 150–400)
RBC: 5.07 MIL/uL (ref 4.22–5.81)
RDW: 14.2 % (ref 11.5–15.5)
WBC: 12.3 10*3/uL — ABNORMAL HIGH (ref 4.0–10.5)

## 2018-03-19 LAB — BASIC METABOLIC PANEL
ANION GAP: 10 (ref 5–15)
BUN: 27 mg/dL — ABNORMAL HIGH (ref 8–23)
CALCIUM: 9.4 mg/dL (ref 8.9–10.3)
CHLORIDE: 100 mmol/L (ref 98–111)
CO2: 27 mmol/L (ref 22–32)
Creatinine, Ser: 1.37 mg/dL — ABNORMAL HIGH (ref 0.61–1.24)
GFR calc non Af Amer: 48 mL/min — ABNORMAL LOW (ref >60)
GFR, EST AFRICAN AMERICAN: 56 mL/min — AB (ref >60)
Glucose, Bld: 359 mg/dL — ABNORMAL HIGH (ref 70–99)
POTASSIUM: 4.1 mmol/L (ref 3.5–5.1)
Sodium: 137 mmol/L (ref 135–145)

## 2018-03-19 LAB — GLUCOSE, CAPILLARY
GLUCOSE-CAPILLARY: 387 mg/dL — AB (ref 70–99)
Glucose-Capillary: 345 mg/dL — ABNORMAL HIGH (ref 70–99)
Glucose-Capillary: 326 mg/dL — ABNORMAL HIGH (ref 70–99)
Glucose-Capillary: 287 mg/dL — ABNORMAL HIGH (ref 70–99)
Glucose-Capillary: 179 mg/dL — ABNORMAL HIGH (ref 70–99)

## 2018-03-19 LAB — PROTIME-INR
INR: 0.96
Prothrombin Time: 12.7 seconds (ref 11.4–15.2)

## 2018-03-19 MED ORDER — INSULIN ASPART 100 UNIT/ML ~~LOC~~ SOLN
0.0000 [IU] | SUBCUTANEOUS | Status: DC
  Administered 2018-03-19: 9 [IU] via SUBCUTANEOUS

## 2018-03-19 MED ORDER — FENTANYL CITRATE (PF) 100 MCG/2ML IJ SOLN
INTRAMUSCULAR | Status: AC
  Filled 2018-03-19: qty 2

## 2018-03-19 MED ORDER — MIDAZOLAM HCL 2 MG/2ML IJ SOLN
INTRAMUSCULAR | Status: AC | PRN
  Administered 2018-03-19 (×2): 0.5 mg via INTRAVENOUS

## 2018-03-19 MED ORDER — MIDAZOLAM HCL 2 MG/2ML IJ SOLN
INTRAMUSCULAR | Status: AC
  Filled 2018-03-19: qty 2

## 2018-03-19 MED ORDER — INSULIN ASPART 100 UNIT/ML ~~LOC~~ SOLN
SUBCUTANEOUS | Status: AC
  Filled 2018-03-19: qty 1

## 2018-03-19 MED ORDER — INSULIN ASPART 100 UNIT/ML ~~LOC~~ SOLN
10.0000 [IU] | Freq: Once | SUBCUTANEOUS | Status: AC
  Administered 2018-03-19: 10 [IU] via SUBCUTANEOUS

## 2018-03-19 MED ORDER — SODIUM CHLORIDE 0.9 % IV SOLN
INTRAVENOUS | Status: DC
  Administered 2018-03-19: 10:00:00 via INTRAVENOUS

## 2018-03-19 MED ORDER — FENTANYL CITRATE (PF) 100 MCG/2ML IJ SOLN
INTRAMUSCULAR | Status: AC | PRN
  Administered 2018-03-19 (×2): 12.5 ug via INTRAVENOUS

## 2018-03-19 NOTE — H&P (Signed)
Chief Complaint: Patient was seen in consultation today for lung mass  Referring Physician(s): Hawkins,Edward  Supervising Physician: Marybelle Killings  Patient Status: [facility] - Out-pt  History of Present Illness: Kevin Frank is a 77 y.o. male with past medical history of COPD, DM2, OSA on CPAP with recent findings of a lung mass.  Current smoker: 3 PPD  CT Chest 02/04/18 showed: 1. Somewhat irregular nodular opacity in the posterior segment of the right upper lobe measuring 2.0 x 1.0 x 0.9 cm. Small neoplasm must be of concern. Advise correlation with nuclear medicine PET study to further assess.  2. No demonstrable pulmonary embolus. No thoracic aortic aneurysm or dissection. There are foci of aortic atherosclerosis as well as coronary artery and great vessel calcification.  3. Underlying centrilobular emphysematous change. Atelectasis evident in both lower lobes and posterior segment left upper lobe. No frank consolidation noted.  4.  No evident thoracic adenopathy.  5. Adrenal hypertrophy bilaterally, more notable on the left than on the right.  Subsequent PET scan 03/11/18 showed: 1. The 2.4 by 0.8 cm apical segment right upper lobe nodule has persisted over the last month and has a maximum SUV of 2.9, highly suspicious for malignancy. No adenopathy or metastatic spread is identified. 2. Other imaging findings of potential clinical significance: Aortic Atherosclerosis (ICD10-I70.0) and Emphysema (ICD10-J43.9). Coronary atherosclerosis. Airway thickening is present, suggesting bronchitis or reactive airways disease. Chronic bilateral maxillary sinusitis. Mild prostatomegaly. Aorta bi-iliac stent graft. Sigmoid colon Diverticulosis.  IR consulted for lung mass biopsy at the request of Dr. Luan Pulling.  Patient has been NPO today.  He has held his Plavix since Saturday.  Currently on steroids for recent bronchitis.   Past Medical History:  Diagnosis Date  . AAA  (abdominal aortic aneurysm) ([place]) 01/2008   3.2cm  . Asthma   . BPH (benign prostatic hyperplasia)   . Chronic knee pain    bilateral  . COPD (chronic obstructive pulmonary disease) ([place])   . Diabetes mellitus    type II  . Gout   . History of kidney stones   . Hypercholesterolemia   . Hyperlipidemia   . Hypertension   . Normal nuclear stress test 2011  . Obesity   . Obstructive sleep apnea on CPAP   . Presence of indwelling urinary catheter   . Shortness of breath    due to cigarette abuse  . Tobacco abuse     Past Surgical History:  Procedure Laterality Date  . ABDOMINAL AORTIC ENDOVASCULAR FENESTRATED STENT GRAFT N/A 04/09/2016   Procedure: ABDOMINAL AORTIC ENDOVASCULAR FENESTRATED STENT GRAFT with left brachial artery access using ultrasound guidance;  Surgeon: Serafina Mitchell, MD;  Location: [place];  Service: Vascular;  Laterality: N/A;  . [place]   [place]  . PERIPHERAL VASCULAR CATHETERIZATION N/A 04/10/2016   Procedure: Renal Angiography;  Surgeon: Angelia Mould, MD;  Location: [place] [facility];  Service: Cardiovascular;  Laterality: N/A;  . TRANSURETHRAL RESECTION OF PROSTATE  01/07/2011   Procedure: TRANSURETHRAL RESECTION OF THE PROSTATE (TURP);  Surgeon: Marissa Nestle;  Location: [facility];  Service: Urology;  Laterality: N/A;    Allergies: No known allergies  Medications: Prior to Admission medications   Medication Sig Start Date End Date Taking? Authorizing Provider  albuterol (PROVENTIL HFA;VENTOLIN HFA) 108 (90 BASE) MCG/ACT inhaler Inhale 2 puffs into the lungs every 6 (six) hours as needed (rescue inhaler).    Yes [provider]  allopurinol (ZYLOPRIM) 300 MG tablet Take 300  mg by mouth daily.     Yes [provider]  amLODipine (NORVASC) 10 MG tablet Take 1 tablet (10 mg total) by mouth daily. 03/11/17  Yes Sherren Mocha, MD  atorvastatin (LIPITOR) 20 MG tablet Take 20 mg by mouth daily.  10/13/13  Yes [provider]  Fluticasone-Umeclidin-Vilant (TRELEGY ELLIPTA) 100-62.5-25 MCG/INH AEPB Inhale into the lungs.   Yes [provider]  hydrALAZINE (APRESOLINE) 50 MG tablet Take 1 tablet (50 mg total) by mouth 2 (two) times daily. 01/26/18 04/26/18 Yes Burtis Junes, NP  losartan (COZAAR) 100 MG tablet TAKE 1 TABLET (100 MG TOTAL) BY MOUTH DAILY. 12/25/17 03/25/18 Yes Burtis Junes, NP  metFORMIN (GLUCOPHAGE) 500 MG tablet Take 500 mg by mouth 2 (two) times daily with a meal.  05/31/16  Yes [provider]  metoprolol succinate (TOPROL-XL) 25 MG 24 hr tablet Take 1 tablet (25 mg total) by mouth daily. 03/11/17  Yes Sherren Mocha, MD  sertraline (ZOLOFT) 50 MG tablet Take 50 mg by mouth daily.     Yes [provider]  torsemide (DEMADEX) 20 MG tablet Take 40 mg by mouth daily.    Yes [provider]  azelastine (ASTELIN) 0.1 % nasal spray Place 1 spray into both nostrils 2 (two) times daily. Use in each nostril as directed    [provider]  clopidogrel (PLAVIX) 75 MG tablet TAKE (1) TABLET BY MOUTH ONCE DAILY. Patient taking differently: Take 75 mg by mouth daily.  03/04/17   Waynetta Sandy, MD     Family History  Problem Relation Age of Onset  . Liver disease Father   . Lymphoma Mother   . Diabetes Mother   . Hypertension Mother   . Cancer Mother   . Mental retardation Unknown        sibling -- from birth   . Asthma Daughter   . Allergic rhinitis Neg Hx   . Angioedema Neg Hx   . Atopy Neg Hx   . Eczema Neg Hx   . Immunodeficiency Neg Hx     Social History   Socioeconomic History  . Marital status: Married    Spouse name: Not on file  . Number of children: Not on file  . Years of education: Not on file  . Highest education level: Not on file  Occupational History  . Not on file  Social Needs  . Financial resource strain: Not on file  . Food insecurity:    Worry: Not on file    Inability: Not on file  . Transportation  needs:    Medical: Not on file    Non-medical: Not on file  Tobacco Use  . Smoking status: Current Every Day Smoker    Packs/day: 2.00    Years: 50.00    Pack years: 100.00    Types: Cigarettes  . Smokeless tobacco: Never Used  . Tobacco comment: 2ppd 10/30/2016 ee  Substance and Sexual Activity  . Alcohol use: Yes    Alcohol/week: 0.0 standard drinks    Comment: occasional  . Drug use: No  . Sexual activity: Not on file  Lifestyle  . Physical activity:    Days per week: Not on file    Minutes per session: Not on file  . Stress: Not on file  Relationships  . Social connections:    Talks on phone: Not on file    Gets together: Not on file    Attends religious service: Not on file  Active member of club or organization: Not on file    Attends meetings of clubs or organizations: Not on file    Relationship status: Not on file  Other Topics Concern  . Not on file  Social History Narrative  . Not on file     Review of Systems: A 12 point ROS discussed and pertinent positives are indicated in the HPI above.  All other systems are negative.  Review of Systems  Constitutional: Negative for fatigue and fever.  Respiratory: Positive for cough and shortness of breath. Negative for wheezing.   Cardiovascular: Negative for chest pain.  Gastrointestinal: Negative for abdominal pain.  Genitourinary: Negative for dysuria and flank pain.  Musculoskeletal: Negative for back pain.  Psychiatric/Behavioral: Negative for behavioral problems and confusion.    Vital Signs: BP (!) 127/56 (BP Location: Right Arm)   Pulse 60   Temp (!) 97.4 F (36.3 C) (Oral)   Ht 5\' 10"  (1.778 m)   Wt 230 lb (104.3 kg)   SpO2 94%   BMI 33.00 kg/m   Physical Exam  Constitutional: He is oriented to person, place, and time. He appears well-developed. No distress.  Cardiovascular: Normal rate, regular rhythm and normal heart sounds. Exam reveals no gallop and no friction rub.  No murmur  heard. Pulmonary/Chest: Effort normal and breath sounds normal. No respiratory distress. He has no wheezes.  Abdominal: Soft. Bowel sounds are normal. He exhibits no distension. There is no tenderness.  Neurological: He is alert and oriented to person, place, and time.  Skin: Skin is warm and dry. He is not diaphoretic.  Psychiatric: He has a normal mood and affect. His behavior is normal. Judgment and thought content normal.  Nursing note and vitals reviewed.    MD Evaluation Airway: WNL Heart: WNL Abdomen: WNL Chest/ Lungs: WNL ASA  Classification: 3 Mallampati/Airway Score: Two   Imaging: Nm Pet Image Initial (pi) Skull Base To Thigh  Result Date: 03/09/2018 CLINICAL DATA:  Initial treatment strategy for right upper lobe pulmonary nodule. EXAM: NUCLEAR MEDICINE PET SKULL BASE TO THIGH TECHNIQUE: 13.6 mCi F-18 FDG was injected intravenously. Full-ring PET imaging was performed from the skull base to thigh after the radiotracer. CT data was obtained and used for attenuation correction and anatomic localization. Fasting blood glucose: 173 mg/dl COMPARISON:  CT chest 02/04/2018 FINDINGS: Mediastinal blood pool activity: SUV max 2.4 NECK: No significant abnormal hypermetabolic activity in this region. Incidental CT findings: Chronic bilateral maxillary sinusitis, left greater than right. CHEST: Persistent 2.4 by 0.8 cm apical segment right upper lobe nodule on image 80/3, maximum SUV 2.9. Incidental CT findings: Coronary, aortic arch, and branch vessel atherosclerotic vascular disease. Centrilobular emphysema. Airway thickening is present, suggesting bronchitis or reactive airways disease. ABDOMEN/PELVIS: No significant abnormal hypermetabolic activity in this region. Incidental CT findings: Aorta bi-iliac stent graft. Bilateral photopenic renal cysts. Diverticula of the transverse duodenum. Sigmoid colon diverticulosis. Mild prostatomegaly. SKELETON: No significant abnormal hypermetabolic  activity in this region. Incidental CT findings: Bridging spurring of the right sacroiliac joint. Degenerative chondral thinning in both hips. Old left anterior lower rib fractures, healed. IMPRESSION: 1. The 2.4 by 0.8 cm apical segment right upper lobe nodule has persisted over the last month and has a maximum SUV of 2.9, highly suspicious for malignancy. No adenopathy or metastatic spread is identified. 2. Other imaging findings of potential clinical significance: Aortic Atherosclerosis (ICD10-I70.0) and Emphysema (ICD10-J43.9). Coronary atherosclerosis. Airway thickening is present, suggesting bronchitis or reactive airways disease. Chronic bilateral maxillary sinusitis. Mild  prostatomegaly. Aorta bi-iliac stent graft. Sigmoid colon diverticulosis. Electronically Signed   By: Van Clines M.D.   On: 03/09/2018 08:51    Labs:  CBC: Recent Labs    04/23/17 1815 04/24/17 0438 07/04/17 1524 10/19/17 1637 03/19/18 0929  WBC 10.9* 9.6  --  14.0* 12.3*  HGB 15.3 14.5 17.3* 15.8 16.6  HCT 44.0 44.0 51.0 47.7 48.9  PLT 215 199  --  165 160    COAGS: No results for input(s): INR, APTT in the last 8760 hours.  BMP: Recent Labs    04/24/17 0438 07/04/17 1524 10/19/17 1637 02/01/18 0752 03/19/18 0929  NA 136 140 138 136 137  K 4.4 3.9 4.3 4.2 4.1  CL 101 103 101 101 100  CO2 26  --  25 25 27   GLUCOSE 121* 141* 151* 215* 359*  BUN 21* 17 16 16  27*  CALCIUM 8.9  --  9.5 9.4 9.4  CREATININE 1.27* 1.00 1.02 1.03 1.37*  GFRNONAA 53*  --  >60 >60 48*  GFRAA >60  --  >60 >60 56*    LIVER FUNCTION TESTS: No results for input(s): BILITOT, AST, ALT, ALKPHOS, PROT, ALBUMIN in the last 8760 hours.  TUMOR MARKERS: No results for input(s): AFPTM, CEA, CA199, CHROMGRNA in the last 8760 hours.  Assessment and Plan: Patient with past medical history of COPD, current smoker presents with complaint of lung mass.  IR consulted for lung mass biopsy at the request of Dr. Luan Pulling. Case  reviewed by Dr. Anselm Pancoast who approves patient for procedure.  Patient presents today in their usual state of health.  He has been NPO and is not currently on blood thinners as he has appropriately held his Plavix since Saturday. Of note, his blood glucose this AM is 387.  He did take his metformin and is on steroids for recent bronchitis.  Will discuss with Dr. Barbie Banner.    Risks and benefits discussed with the patient including, but not limited to bleeding, hemoptysis, respiratory failure requiring intubation, infection, pneumothorax requiring chest tube placement, stroke from air embolism or even death.  All of the patient's questions were answered, patient is agreeable to proceed. Consent signed and in chart.  Thank you for this interesting consult.  I greatly enjoyed meeting Kevin Frank and look forward to participating in their care.  A copy of this report was sent to the requesting provider on this date.  Electronically Signed: Docia Barrier, PA 03/19/2018, 10:37 AM   I spent a total of  30 Minutes   in face to face in clinical consultation, greater than 50% of which was counseling/coordinating care for lung mass.

## 2018-03-19 NOTE — Discharge Instructions (Signed)
Lung Biopsy, Care After °This sheet gives you information about how to care for yourself after your procedure. Your health care provider may also give you more specific instructions depending on the type of biopsy you had. If you have problems or questions, contact your health care provider. °What can I expect after the procedure? °After the procedure, it is common to have: °· A cough. °· A sore throat. °· Pain where a needle, bronchoscope, or incision was used to collect a biopsy sample (biopsy site). ° °Follow these instructions at home: °Medicines °· Take over-the-counter and prescription medicines only as told by your health care provider. °· Do not drive for 24 hours if you were given a sedative. °· Do not drink alcohol while taking pain medicine. °· Do not drive or use heavy machinery while taking prescription pain medicine. °· To prevent or treat constipation while you are taking prescription pain medicine, your health care provider may recommend that you: °? Drink enough fluid to keep your urine clear or pale yellow. °? Take over-the-counter or prescription medicines. °? Eat foods that are high in fiber, such as fresh fruits and vegetables, whole grains, and beans. °? Limit foods that are high in fat and processed sugars, such as fried and sweet foods. °Activity °· If you had an incision during your procedure, avoid activities that may pull the incision site open. °· Return to your normal activities as told by your health care provider. Ask your health care provider what activities are safe for you. °If you had an open biopsy:  °· Follow instructions from your health care provider about how to take care of your incision. Make sure you: °? Wash your hands with soap and water before you change your bandage (dressing). If soap and water are not available, use hand sanitizer. °? Change your dressing as told by your health care provider. °? Leave stitches (sutures), skin glue, or adhesive strips in place. These  skin closures may need to stay in place for 2 weeks or longer. If adhesive strip edges start to loosen and curl up, you may trim the loose edges. Do not remove adhesive strips completely unless your health care provider tells you to do that. °· Check your incision area every day for signs of infection. Check for: °? Redness, swelling, or pain. °? Fluid or blood. °? Warmth. °? Pus or a bad smell. °General instructions °· It is up to you to get the results of your procedure. Ask your health care provider, or the department that is doing the procedure, when your results will be ready. °Contact a health care provider if: °· You have a fever. °· You have redness, swelling, or pain around your biopsy site. °· You have fluid or blood coming from your biopsy site. °· Your biopsy site feels warm to the touch. °· You have pus or a bad smell coming from your biopsy site. °Get help right away if: °· You cough up blood. °· You have trouble breathing. °· You have chest pain. °Summary °· After the procedure, it is common to have a sore throat and a cough. °· Return to your normal activities as told by your health care provider. Ask your health care provider what activities are safe for you. °· Take over-the-counter and prescription medicines only as told by your health care provider. °· Report any unusual symptoms to your health care provider. °This information is not intended to replace advice given to you by your health care provider. Make sure   you discuss any questions you have with your health care provider. Document Released: 07/08/2016 Document Revised: 07/08/2016 Document Reviewed: 07/08/2016 Elsevier Interactive Patient Education  2018 [place]. Moderate Conscious Sedation, Adult, Care After These instructions provide you with information about caring for yourself after your procedure. Your health care provider may also give you more specific instructions. Your treatment has been planned according to current  medical practices, but problems sometimes occur. Call your health care provider if you have any problems or questions after your procedure. What can I expect after the procedure? After your procedure, it is common:  To feel sleepy for several hours.  To feel clumsy and have poor balance for several hours.  To have poor judgment for several hours.  To vomit if you eat too soon.  Follow these instructions at home: For at least 24 hours after the procedure:   Do not: ? Participate in activities where you could fall or become injured. ? Drive. ? Use heavy machinery. ? Drink alcohol. ? Take sleeping pills or medicines that cause drowsiness. ? Make important decisions or sign legal documents. ? Take care of children on your own.  Rest. Eating and drinking  Follow the diet recommended by your health care provider.  If you vomit: ? Drink water, juice, or soup when you can drink without vomiting. ? Make sure you have little or no nausea before eating solid foods. General instructions  Have a responsible adult stay with you until you are awake and alert.  Take over-the-counter and prescription medicines only as told by your health care provider.  If you smoke, do not smoke without supervision.  Keep all follow-up visits as told by your health care provider. This is important. Contact a health care provider if:  You keep feeling nauseous or you keep vomiting.  You feel light-headed.  You develop a rash.  You have a fever. Get help right away if:  You have trouble breathing. This information is not intended to replace advice given to you by your health care provider. Make sure you discuss any questions you have with your health care provider. Document Released: 03/30/2013 Document Revised: 11/12/2015 Document Reviewed: 09/29/2015 Elsevier Interactive Patient Education  [employer].

## 2018-03-19 NOTE — Procedures (Signed)
RUL lung biopsy 18 g core times two EBL 0 Comp 0

## 2018-03-24 ENCOUNTER — Other Ambulatory Visit: Payer: Self-pay | Admitting: Cardiovascular Disease

## 2018-03-26 ENCOUNTER — Encounter (HOSPITAL_COMMUNITY): Payer: Self-pay | Admitting: *Deleted

## 2018-03-26 NOTE — Progress Notes (Signed)
I called patient and spoke with him over the phone. I introduced myself as navigator and provided my contact numbers.  I advised him about his appointments next week with [place] and he states that he is aware. He verbalizes understanding of why he is coming to see [place] and his diagnosis.  Patient states that "it is what it is and I am gonna do what I have to do".  I encouraged the patient to call me should he have any questions or concerns at any point during his treatment and care here.  He verbalizes understanding.

## 2018-03-29 ENCOUNTER — Encounter (HOSPITAL_COMMUNITY): Payer: Self-pay | Admitting: Hematology

## 2018-03-29 ENCOUNTER — Inpatient Hospital Stay (HOSPITAL_COMMUNITY): Payer: Medicare HMO | Attending: Hematology | Admitting: Hematology

## 2018-03-29 VITALS — BP 116/61 | HR 79 | Temp 97.6°F | Resp 18 | Ht 69.0 in | Wt 235.3 lb

## 2018-03-29 DIAGNOSIS — Z720 Tobacco use: Secondary | ICD-10-CM | POA: Diagnosis not present

## 2018-03-29 DIAGNOSIS — Z79899 Other long term (current) drug therapy: Secondary | ICD-10-CM | POA: Insufficient documentation

## 2018-03-29 DIAGNOSIS — Z807 Family history of other malignant neoplasms of lymphoid, hematopoietic and related tissues: Secondary | ICD-10-CM | POA: Diagnosis not present

## 2018-03-29 DIAGNOSIS — C3491 Malignant neoplasm of unspecified part of right bronchus or lung: Secondary | ICD-10-CM | POA: Insufficient documentation

## 2018-03-29 DIAGNOSIS — C3411 Malignant neoplasm of upper lobe, right bronchus or lung: Secondary | ICD-10-CM

## 2018-03-29 NOTE — Assessment & Plan Note (Signed)
1.  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  A CT scan of the chest PE protocol on 02/04/2018 did not show pulmonary embolism.  However it showed 2.0 x 1.0 x 0.9 cm right upper lobe lung nodule. -A PET CT scan on 03/08/2018 shows 2.4 x 0.8 cm right upper lobe nodule with SUV of 2.9.  No adenopathy or metastatic disease was seen. -CT-guided biopsy on 03/19/2018 shows squamous cell carcinoma. - I have discussed the findings on the CT scan and the pathology report with the patient and his family in detail.  I will make a referral to CT surgery for possible resection.  If he is not a surgical candidate, radiation would be the next best option. -I will order MRI of the brain with and without gadolinium to complete staging work-up. - I will see him back 4 weeks after his surgery to discuss the pathology report and further follow-ups. -He was counseled to quit smoking.  He has been smoking 2 to 3 packs/day for 65 years.

## 2018-03-29 NOTE — Progress Notes (Signed)
[facility] NOTE  Patient Care Team: Sinda Du, MD as PCP - General (Internal Medicine)  CHIEF COMPLAINTS/PURPOSE OF CONSULTATION:  Right upper lobe squamous cell carcinoma.  HISTORY OF PRESENTING ILLNESS:  Kevin Frank 77 y.o. male is seen in consultation today for further work-up and management of right upper lobe lung cancer.  He had worsening shortness of breath in the past few months, and had work-up including echocardiogram.  A CT scan of the chest PE protocol was done to rule out chronic pulmonary emboli accounting for his shortness of breath.  However this showed 2 x 1 x 0.9 cm right upper lobe lung nodule with no evidence of pulmonary embolism.  A PET CT scan done on 03/08/2018 shows 2.4 x 0.8 cm right upper lobe lung nodule, SUV of 2.9 with no adenopathy or metastatic disease.  He underwent CT-guided biopsy on 03/19/2018 which showed squamous cell carcinoma.  He has chronic cough.  He experienced slight hemoptysis started after biopsy which has improved at this time.  Denies any fevers, night sweats or weight loss.  Denies any headaches or vision changes.  He is a tobacco farmer.  He had exposure to DVT, and Endrin and roundup but denies any asbestos exposure.  He is accompanied by his wife and daughter today.  He is able to do all his day-to-day activities. Family history significant for mother having lymphoma.  MEDICAL HISTORY:  Past Medical History:  Diagnosis Date  . AAA (abdominal aortic aneurysm) ([place]) 01/2008   3.2cm  . Asthma   . BPH (benign prostatic hyperplasia)   . Chronic knee pain    bilateral  . COPD (chronic obstructive pulmonary disease) ([place])   . Diabetes mellitus    type II  . Gout   . History of kidney stones   . Hypercholesterolemia   . Hyperlipidemia   . Hypertension   . Normal nuclear stress test 2011  . Obesity   . Obstructive sleep apnea on CPAP   . Presence of indwelling urinary catheter   . Shortness of breath    due to cigarette abuse  . Tobacco abuse     SURGICAL HISTORY: Past Surgical History:  Procedure Laterality Date  . ABDOMINAL AORTIC ENDOVASCULAR FENESTRATED STENT GRAFT N/A 04/09/2016   Procedure: ABDOMINAL AORTIC ENDOVASCULAR FENESTRATED STENT GRAFT with left brachial artery access using ultrasound guidance;  Surgeon: Serafina Mitchell, MD;  Location: [facility];  Service: Vascular;  Laterality: N/A;  . [place]   [place]  . PERIPHERAL VASCULAR CATHETERIZATION N/A 04/10/2016   Procedure: Renal Angiography;  Surgeon: Angelia Mould, MD;  Location: [facility];  Service: Cardiovascular;  Laterality: N/A;  . TRANSURETHRAL RESECTION OF PROSTATE  01/07/2011   Procedure: TRANSURETHRAL RESECTION OF THE PROSTATE (TURP);  Surgeon: Marissa Nestle;  Location: [facility];  Service: Urology;  Laterality: N/A;    SOCIAL HISTORY: Social History   Socioeconomic History  . Marital status: Married    Spouse name: Not on file  . Number of children: Not on file  . Years of education: Not on file  . Highest education level: Not on file  Occupational History  . Not on file  Social Needs  . Financial resource strain: Not on file  . Food insecurity:    Worry: Not on file    Inability: Not on file  . Transportation needs:    Medical: Not on file    Non-medical: Not on file  Tobacco Use  .  Smoking status: Current Every Day Smoker    Packs/day: 3.00    Years: 50.00    Pack years: 150.00    Types: Cigarettes  . Smokeless tobacco: Never Used  . Tobacco comment: 2ppd 10/30/2016 ee  Substance and Sexual Activity  . Alcohol use: Yes    Alcohol/week: 0.0 standard drinks    Comment: very little  . Drug use: No  . Sexual activity: Not on file  Lifestyle  . Physical activity:    Days per week: Not on file    Minutes per session: Not on file  . Stress: Not on file  Relationships  . Social connections:    Talks on phone: Not on file    Gets together: Not on file    Attends  religious service: Not on file    Active member of club or organization: Not on file    Attends meetings of clubs or organizations: Not on file    Relationship status: Not on file  . Intimate partner violence:    Fear of current or ex partner: Not on file    Emotionally abused: Not on file    Physically abused: Not on file    Forced sexual activity: Not on file  Other Topics Concern  . Not on file  Social History Narrative  . Not on file    FAMILY HISTORY: Family History  Problem Relation Age of Onset  . Liver disease Father   . Lymphoma Mother   . Diabetes Mother   . Hypertension Mother   . Cancer Mother   . Mental retardation Unknown        sibling -- from birth   . Asthma Daughter   . Allergic rhinitis Neg Hx   . Angioedema Neg Hx   . Atopy Neg Hx   . Eczema Neg Hx   . Immunodeficiency Neg Hx     ALLERGIES:  is allergic to no known allergies.  MEDICATIONS:  Current Outpatient Medications  Medication Sig Dispense Refill  . albuterol (PROVENTIL HFA;VENTOLIN HFA) 108 (90 BASE) MCG/ACT inhaler Inhale 2 puffs into the lungs every 6 (six) hours as needed (rescue inhaler).     [name] allopurinol (ZYLOPRIM) 300 MG tablet Take 300 mg by mouth daily.      [name] amLODipine (NORVASC) 10 MG tablet TAKE 1 TABLET EVERY DAY 90 tablet 3  . atorvastatin (LIPITOR) 20 MG tablet Take 20 mg by mouth daily.     [name] azelastine (ASTELIN) 0.1 % nasal spray Place 1 spray into both nostrils 2 (two) times daily. Use in each nostril as directed    . clopidogrel (PLAVIX) 75 MG tablet TAKE (1) TABLET BY MOUTH ONCE DAILY. (Patient taking differently: Take 75 mg by mouth daily. ) 30 tablet 0  . Fluticasone-Umeclidin-Vilant (TRELEGY ELLIPTA) 100-62.5-25 MCG/INH AEPB Inhale into the lungs.    . hydrALAZINE (APRESOLINE) 50 MG tablet Take 1 tablet (50 mg total) by mouth 2 (two) times daily. 60 tablet 6  . metFORMIN (GLUCOPHAGE) 500 MG tablet Take 500 mg by mouth 2 (two) times daily with a meal.     . metoprolol  succinate (TOPROL-XL) 25 MG 24 hr tablet TAKE 1 TABLET EVERY DAY 90 tablet 3  . sertraline (ZOLOFT) 50 MG tablet Take 50 mg by mouth daily.      [name] torsemide (DEMADEX) 20 MG tablet Take 40 mg by mouth daily.     [name] losartan (COZAAR) 100 MG tablet TAKE 1 TABLET (100 MG TOTAL) BY MOUTH DAILY. [place]  tablet 2   Current Facility-Administered Medications  Medication Dose Route Frequency Provider Last Rate Last Dose  . ipratropium (ATROVENT) nebulizer solution 0.5 mg  0.5 mg Nebulization Once Gean Quint, MD      . levalbuterol Penne Lash) nebulizer solution 1.25 mg  1.25 mg Nebulization Once Gean Quint, MD      . omalizumab Arvid Right) injection 375 mg  375 mg Subcutaneous Q14 Days Valentina Shaggy, MD   375 mg at 03/16/18 8242    REVIEW OF SYSTEMS:   Constitutional: Denies fevers, chills or abnormal night sweats Eyes: Denies blurriness of vision, double vision or watery eyes Ears, nose, mouth, throat, and face: Denies mucositis or sore throat Respiratory: Cough present.  Shortness of breath on exertion present. Cardiovascular: Denies palpitation, chest discomfort or lower extremity swelling Gastrointestinal:  Denies nausea, heartburn or change in bowel habits Skin: Denies abnormal skin rashes Lymphatics: Denies new lymphadenopathy or easy bruising Neurological:Denies numbness, tingling or new weaknesses Behavioral/Psych: Mood is stable, no new changes  All other systems were reviewed with the patient and are negative.  PHYSICAL EXAMINATION: ECOG PERFORMANCE STATUS: 1 - Symptomatic but completely ambulatory  Vitals:   03/29/18 1347  BP: 116/61  Pulse: 79  Resp: 18  Temp: 97.6 F (36.4 C)  SpO2: 100%   Filed Weights   03/29/18 1347  Weight: 235 lb 4.8 oz (106.7 kg)    GENERAL:alert, no distress and comfortable SKIN: skin color, texture, turgor are normal, no rashes or significant lesions EYES: normal, conjunctiva are pink and non-injected, sclera clear OROPHARYNX:no  exudate, no erythema and lips, buccal mucosa, and tongue normal  NECK: supple, thyroid normal size, non-tender, without nodularity LYMPH:  no palpable lymphadenopathy in the cervical, axillary or inguinal LUNGS: Inspiratory rhonchi present.  Normal breathing effort. HEART: regular rate & rhythm and no murmurs.  1+ edema bilaterally. ABDOMEN:abdomen soft, non-tender and normal bowel sounds Musculoskeletal:no cyanosis of digits and no clubbing  PSYCH: alert & oriented x 3 with fluent speech NEURO: no focal motor/sensory deficits  LABORATORY DATA:  I have reviewed the data as listed Lab Results  Component Value Date   WBC 12.3 (H) 03/19/2018   HGB 16.6 03/19/2018   HCT 48.9 03/19/2018   MCV 96.4 03/19/2018   PLT 160 03/19/2018     Chemistry      Component Value Date/Time   NA 137 03/19/2018 0929   NA 141 03/18/2017 1042   K 4.1 03/19/2018 0929   CL 100 03/19/2018 0929   CO2 27 03/19/2018 0929   BUN 27 (H) 03/19/2018 0929   BUN 15 03/18/2017 1042   CREATININE 1.37 (H) 03/19/2018 0929   CREATININE 0.98 02/08/2016 1300      Component Value Date/Time   CALCIUM 9.4 03/19/2018 0929   ALKPHOS 87 04/02/2016 1023   AST 18 04/02/2016 1023   ALT 23 04/02/2016 1023   BILITOT 0.6 04/02/2016 1023       RADIOGRAPHIC STUDIES: I have personally reviewed the radiological images as listed and agreed with the findings in the report. Nm Pet Image Initial (pi) Skull Base To Thigh  Result Date: 03/09/2018 CLINICAL DATA:  Initial treatment strategy for right upper lobe pulmonary nodule. EXAM: NUCLEAR MEDICINE PET SKULL BASE TO THIGH TECHNIQUE: 13.6 mCi F-18 FDG was injected intravenously. Full-ring PET imaging was performed from the skull base to thigh after the radiotracer. CT data was obtained and used for attenuation correction and anatomic localization. Fasting blood glucose: 173 mg/dl COMPARISON:  CT chest 02/04/2018  FINDINGS: Mediastinal blood pool activity: SUV max 2.4 NECK: No significant  abnormal hypermetabolic activity in this region. Incidental CT findings: Chronic bilateral maxillary sinusitis, left greater than right. CHEST: Persistent 2.4 by 0.8 cm apical segment right upper lobe nodule on image 80/3, maximum SUV 2.9. Incidental CT findings: Coronary, aortic arch, and branch vessel atherosclerotic vascular disease. Centrilobular emphysema. Airway thickening is present, suggesting bronchitis or reactive airways disease. ABDOMEN/PELVIS: No significant abnormal hypermetabolic activity in this region. Incidental CT findings: Aorta bi-iliac stent graft. Bilateral photopenic renal cysts. Diverticula of the transverse duodenum. Sigmoid colon diverticulosis. Mild prostatomegaly. SKELETON: No significant abnormal hypermetabolic activity in this region. Incidental CT findings: Bridging spurring of the right sacroiliac joint. Degenerative chondral thinning in both hips. Old left anterior lower rib fractures, healed. IMPRESSION: 1. The 2.4 by 0.8 cm apical segment right upper lobe nodule has persisted over the last month and has a maximum SUV of 2.9, highly suspicious for malignancy. No adenopathy or metastatic spread is identified. 2. Other imaging findings of potential clinical significance: Aortic Atherosclerosis (ICD10-I70.0) and Emphysema (ICD10-J43.9). Coronary atherosclerosis. Airway thickening is present, suggesting bronchitis or reactive airways disease. Chronic bilateral maxillary sinusitis. Mild prostatomegaly. Aorta bi-iliac stent graft. Sigmoid colon diverticulosis. Electronically Signed   By: Van Clines M.D.   On: 03/09/2018 08:51   Ct Biopsy  Result Date: 03/19/2018 INDICATION: Right upper lobe lung nodule EXAM: CT BIOPSY MEDICATIONS: None. ANESTHESIA/SEDATION: Fentanyl 25 mcg IV; Versed 1 mg IV Moderate Sedation Time:  11 minutes The patient was continuously monitored during the procedure by the interventional radiology nurse under my direct supervision. FLUOROSCOPY TIME:   Fluoroscopy Time:  minutes  seconds ( mGy). COMPLICATIONS: None immediate. PROCEDURE: Informed written consent was obtained from the patient after a thorough discussion of the procedural risks, benefits and alternatives. All questions were addressed. Maximal Sterile Barrier Technique was utilized including caps, mask, sterile gowns, sterile gloves, sterile drape, hand hygiene and skin antiseptic. A timeout was performed prior to the initiation of the procedure. Under CT guidance, a(n) 17 gauge guide needle was advanced into the right upper lobe lung nodule via posterior approach. Subsequently 2 18 gauge core biopsies were obtained. The guide needle was removed. Post biopsy images demonstrate no pneumothorax. Patient tolerated the procedure well without complication. Vital sign monitoring by nursing staff during the procedure will continue as patient is in the special procedures unit for post procedure observation. FINDINGS: The images document guide needle placement within the right upper lobe lung nodule. Post biopsy images demonstrate no pneumothorax. IMPRESSION: Successful CT-guided core biopsy of a right upper lobe lung nodule. Electronically Signed   By: Marybelle Killings M.D.   On: 03/19/2018 15:32   Dg Chest Port 1 View  Result Date: 03/19/2018 CLINICAL DATA:  Status post biopsy. EXAM: PORTABLE CHEST 1 VIEW COMPARISON:  Radiograph of April 23, 2017. FINDINGS: Stable cardiomediastinal silhouette. No pneumothorax or pleural effusion is noted. Left lung is clear. Right upper lobe density is noted most consistent with contusion secondary to biopsy. Bony thorax is unremarkable. IMPRESSION: No pneumothorax status post right lung biopsy. Electronically Signed   By: Marijo Conception, M.D.   On: 03/19/2018 15:48    ASSESSMENT & PLAN:  Squamous cell carcinoma of right lung (HCC) 1.  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  A CT scan of the chest PE protocol on 02/04/2018 did  not show pulmonary embolism.  However it showed 2.0 x 1.0 x 0.9 cm right upper lobe lung nodule. -A PET CT scan on 03/08/2018 shows 2.4 x 0.8 cm right upper lobe nodule with SUV of 2.9.  No adenopathy or metastatic disease was seen. -CT-guided biopsy on 03/19/2018 shows squamous cell carcinoma. - I have discussed the findings on the CT scan and the pathology report with the patient and his family in detail.  I will make a referral to CT surgery for possible resection.  If he is not a surgical candidate, radiation would be the next best option. -I will order MRI of the brain with and without gadolinium to complete staging work-up. - I will see him back 4 weeks after his surgery to discuss the pathology report and further follow-ups. -He was counseled to quit smoking.  He has been smoking 2 to 3 packs/day for 65 years.  Orders Placed This Encounter  Procedures  . MR Brain W Wo Contrast    Standing Status:   Future    Standing Expiration Date:   03/29/2019    Order Specific Question:   ** REASON FOR EXAM (FREE TEXT)    Answer:   lung cancer    Order Specific Question:   If indicated for the ordered procedure, I authorize the administration of contrast media per Radiology protocol    Answer:   Yes    Order Specific Question:   What is the patient's sedation requirement?    Answer:   No Sedation    Order Specific Question:   Does the patient have a pacemaker or implanted devices?    Answer:   No    Order Specific Question:   Use SRS Protocol?    Answer:   Yes    Order Specific Question:   Radiology Contrast Protocol - do NOT remove file path    Answer:   [email or web address]    Order Specific Question:   Preferred imaging location?    Answer:   [facility] (table limit-350lbs)    All questions were answered. The patient knows to call the clinic with any problems, questions or concerns. Total  time spent is 45 minutes with more than 50% of the time spent face-to-face discussing scan findings, treatment options and coordination of care.     Derek Jack, MD 03/29/2018 3:59 PM

## 2018-03-29 NOTE — Patient Instructions (Signed)
[facility] at [facility] Discharge Instructions     Thank you for choosing [facility] at [facility] to provide your oncology and hematology care.  To afford each patient quality time with our provider, please arrive at least 15 minutes before your scheduled appointment time.   If you have a lab appointment with the Cancer Center please come in thru the  Main Entrance and check in at the main information desk  You need to re-schedule your appointment should you arrive 10 or more minutes late.  We strive to give you quality time with our providers, and arriving late affects you and other patients whose appointments are after yours.  Also, if you no show three or more times for appointments you may be dismissed from the clinic at the providers discretion.     Again, thank you for choosing [facility].  Our hope is that these requests will decrease the amount of time that you wait before being seen by our physicians.       _____________________________________________________________  Should you have questions after your visit to  Cancer Center, please contact our office at [phone] between the hours of 8:00 a.m. and 4:30 p.m.  Voicemails left after 4:00 p.m. will not be returned until the following business day.  For prescription refill requests, have your pharmacy contact our office and allow 72 hours.    Cancer Center Support Programs:   > Cancer Support Group  2nd Tuesday of the month 1pm-2pm, Journey Room    

## 2018-03-30 ENCOUNTER — Ambulatory Visit (INDEPENDENT_AMBULATORY_CARE_PROVIDER_SITE_OTHER): Payer: Medicare HMO

## 2018-03-30 ENCOUNTER — Ambulatory Visit: Payer: Medicare HMO | Admitting: General Surgery

## 2018-03-30 DIAGNOSIS — J4540 Moderate persistent asthma, uncomplicated: Secondary | ICD-10-CM

## 2018-03-31 DIAGNOSIS — E1165 Type 2 diabetes mellitus with hyperglycemia: Secondary | ICD-10-CM | POA: Diagnosis not present

## 2018-03-31 DIAGNOSIS — J449 Chronic obstructive pulmonary disease, unspecified: Secondary | ICD-10-CM | POA: Diagnosis not present

## 2018-03-31 DIAGNOSIS — M5430 Sciatica, unspecified side: Secondary | ICD-10-CM | POA: Diagnosis not present

## 2018-03-31 DIAGNOSIS — C3490 Malignant neoplasm of unspecified part of unspecified bronchus or lung: Secondary | ICD-10-CM | POA: Diagnosis not present

## 2018-04-01 ENCOUNTER — Encounter: Payer: Medicare HMO | Admitting: Cardiothoracic Surgery

## 2018-04-01 ENCOUNTER — Ambulatory Visit: Payer: Medicare HMO | Admitting: General Surgery

## 2018-04-01 ENCOUNTER — Ambulatory Visit (HOSPITAL_COMMUNITY)
Admission: RE | Admit: 2018-04-01 | Discharge: 2018-04-01 | Disposition: A | Discharge status: Home / Self Care | Payer: Medicare HMO | Source: Ambulatory Visit | Attending: Nurse Practitioner | Admitting: Nurse Practitioner

## 2018-04-01 DIAGNOSIS — G9389 Other specified disorders of brain: Secondary | ICD-10-CM | POA: Insufficient documentation

## 2018-04-01 DIAGNOSIS — G4733 Obstructive sleep apnea (adult) (pediatric): Secondary | ICD-10-CM | POA: Diagnosis not present

## 2018-04-01 DIAGNOSIS — C3491 Malignant neoplasm of unspecified part of right bronchus or lung: Secondary | ICD-10-CM | POA: Diagnosis not present

## 2018-04-01 DIAGNOSIS — I714 Abdominal aortic aneurysm, without rupture: Secondary | ICD-10-CM | POA: Diagnosis not present

## 2018-04-01 DIAGNOSIS — R269 Unspecified abnormalities of gait and mobility: Secondary | ICD-10-CM | POA: Diagnosis not present

## 2018-04-01 DIAGNOSIS — C3490 Malignant neoplasm of unspecified part of unspecified bronchus or lung: Secondary | ICD-10-CM | POA: Diagnosis not present

## 2018-04-01 MED ORDER — GADOBUTROL 1 MMOL/ML IV SOLN
10.0000 mL | Freq: Once | INTRAVENOUS | Status: AC | PRN
  Administered 2018-04-01: 10 mL via INTRAVENOUS

## 2018-04-02 ENCOUNTER — Encounter: Payer: Medicare HMO | Admitting: Cardiothoracic Surgery

## 2018-04-05 ENCOUNTER — Other Ambulatory Visit: Payer: Self-pay | Admitting: Cardiothoracic Surgery

## 2018-04-05 DIAGNOSIS — C3490 Malignant neoplasm of unspecified part of unspecified bronchus or lung: Secondary | ICD-10-CM

## 2018-04-07 ENCOUNTER — Encounter: Payer: Medicare HMO | Admitting: Cardiothoracic Surgery

## 2018-04-07 ENCOUNTER — Ambulatory Visit (HOSPITAL_COMMUNITY)
Admission: RE | Admit: 2018-04-07 | Discharge: 2018-04-07 | Disposition: A | Discharge status: Home / Self Care | Payer: Medicare HMO | Source: Ambulatory Visit | Attending: Cardiothoracic Surgery | Admitting: Cardiothoracic Surgery

## 2018-04-07 DIAGNOSIS — C3490 Malignant neoplasm of unspecified part of unspecified bronchus or lung: Secondary | ICD-10-CM | POA: Insufficient documentation

## 2018-04-07 DIAGNOSIS — Z79899 Other long term (current) drug therapy: Secondary | ICD-10-CM | POA: Diagnosis not present

## 2018-04-07 DIAGNOSIS — J449 Chronic obstructive pulmonary disease, unspecified: Secondary | ICD-10-CM | POA: Insufficient documentation

## 2018-04-07 DIAGNOSIS — F17210 Nicotine dependence, cigarettes, uncomplicated: Secondary | ICD-10-CM | POA: Diagnosis not present

## 2018-04-07 DIAGNOSIS — R918 Other nonspecific abnormal finding of lung field: Secondary | ICD-10-CM | POA: Insufficient documentation

## 2018-04-07 LAB — PULMONARY FUNCTION TEST
DL/VA % pred: 51 %
DL/VA: 2.38 ml/min/mmHg/L
DLCO unc % pred: 47 %
DLCO unc: 15.4 ml/min/mmHg
FEF 25-75 Post: 0.99 L/sec
FEF 25-75 Pre: 0.47 L/sec
FEF2575-%Change-Post: 109 %
FEF2575-%Pred-Post: 46 %
FEF2575-%Pred-Pre: 22 %
FEV1-%Change-Post: 20 %
FEV1-%Pred-Post: 75 %
FEV1-%Pred-Pre: 62 %
FEV1-Post: 2.25 L
FEV1-Pre: 1.87 L
FEV1FVC-%Change-Post: 5 %
FEV1FVC-%Pred-Pre: 68 %
FEV6-%Change-Post: 21 %
FEV6-%Pred-Post: 94 %
FEV6-%Pred-Pre: 78 %
FEV6-Post: 3.68 L
FEV6-Pre: 3.04 L
FEV6FVC-%Change-Post: 4 %
FEV6FVC-%Pred-Post: 91 %
FEV6FVC-%Pred-Pre: 87 %
FVC-%Change-Post: 14 %
FVC-%Pred-Post: 104 %
FVC-%Pred-Pre: 91 %
FVC-Post: 4.34 L
FVC-Pre: 3.78 L
Post FEV1/FVC ratio: 52 %
Post FEV6/FVC ratio: 86 %
Pre FEV1/FVC ratio: 49 %
Pre FEV6/FVC Ratio: 82 %
RV % pred: 164 %
RV: 4.27 L
TLC % pred: 120 %
TLC: 8.47 L

## 2018-04-07 MED ORDER — ALBUTEROL SULFATE (2.5 MG/3ML) 0.083% IN NEBU
2.5000 mg | INHALATION_SOLUTION | Freq: Once | RESPIRATORY_TRACT | Status: AC
  Administered 2018-04-07: 2.5 mg via RESPIRATORY_TRACT

## 2018-04-08 ENCOUNTER — Encounter: Payer: Self-pay | Admitting: Cardiothoracic Surgery

## 2018-04-08 ENCOUNTER — Other Ambulatory Visit: Payer: Self-pay

## 2018-04-08 ENCOUNTER — Other Ambulatory Visit: Payer: Self-pay | Admitting: *Deleted

## 2018-04-08 ENCOUNTER — Institutional Professional Consult (permissible substitution): Payer: Medicare HMO | Admitting: Cardiothoracic Surgery

## 2018-04-08 VITALS — BP 136/68 | HR 82 | Resp 18 | Ht 69.0 in | Wt 235.0 lb

## 2018-04-08 DIAGNOSIS — Z01818 Encounter for other preprocedural examination: Secondary | ICD-10-CM

## 2018-04-08 DIAGNOSIS — C3491 Malignant neoplasm of unspecified part of right bronchus or lung: Secondary | ICD-10-CM

## 2018-04-08 DIAGNOSIS — C3490 Malignant neoplasm of unspecified part of unspecified bronchus or lung: Secondary | ICD-10-CM

## 2018-04-08 NOTE — Progress Notes (Addendum)
  6 Minute Walk Test Results  Patient: Kevin Frank Date:  04/08/2018   Supplemental O2 during test? No     End Baseline     Time  0859 0853     Heartrate 111 85     Dyspnea 1 0.5     Fatigue n/a n/a     O2 sat  92% 95%     Blood pressure n/a 136/68       Patient ambulated at a steady/ moderate pace for a total distance of 1176 feet with 0 stops.  Ambulation was limited primarily due to n/a.  Overall the test was tolerated well.

## 2018-04-08 NOTE — Progress Notes (Signed)
[address]       [place] [address]             [phone]                    Kevin Frank Hines Medical Record #[id number] Date of Birth: February 07, 1941  Referring: Derek Jack, MD Primary Care: Sinda Du, MD Primary Cardiologist: No primary care provider on file.  Chief Complaint:    Chief Complaint  Patient presents with  . Lung Cancer    new patient consultation, MRI Brain 04/01/18, CT Biopsy 03/19/18, PET 03/08/18, Chest CT 02/04/18    History of Present Illness:    Kevin Frank 77 y.o. male is seen in the office  today for recent diagnosis of squamous cell carcinoma of the right upper lobe.  The patient is a long-term smoker and currently smokes for over 65 years.  Because of worsening shortness of breath over the summer he had work-up including CT scan of the chest to rule out PE and an echocardiogram.  CT scan of the chest showed a 2 x 1 x 0.9 cm right upper lobe lung nodule, no evidence of pulmonary emboli.  PET scan was done September 16 confirming the nodule with SUV of 2.9, there was no evidence of metastatic disease.  CT needle biopsy was done on 03/19/2018 demonstrating squamous cell carcinoma.   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   Pulmonary function studies were performed recently demonstrating significant diffusion deficit.  Patient does admit to shortness of breath with exertion, he is unable to do the work that he previously did due to shortness of breath.  He notes he has frequent coughing in the morning.  He has episodes of wheezing.   2 years ago he had endovascular abdominal aortic aneurysm repair and this including right renal artery fenestration and ultimately because of rising creatinine stenting of his left renal artery.  Patient is on chronic Plavix.  Current Activity/ Functional  Status:  Patient is independent with mobility/ambulation, transfers, ADL's, IADL's.   Zubrod Score: At the time of surgery this patient's most appropriate activity status/level should be described as: []     0    Normal activity, no symptoms [x]     1    Restricted in physical strenuous activity but ambulatory, able to do out light work []     2    Ambulatory and capable of self care, unable to do work activities, up and about               >50 % of waking hours                              []     3    Only limited self care, in bed greater than 50% of waking hours []     4    Completely disabled, no self care, confined to bed or chair []     5    Moribund   Past Medical History:  Diagnosis Date  . AAA (abdominal aortic aneurysm) ([place]) 01/2008   3.2cm  . Asthma   . BPH (benign prostatic hyperplasia)   . Chronic knee pain    bilateral  . COPD (chronic obstructive pulmonary disease) ([place])   .  Diabetes mellitus    type II  . Gout   . History of kidney stones   . Hypercholesterolemia   . Hyperlipidemia   . Hypertension   . Normal nuclear stress test 2011  . Obesity   . Obstructive sleep apnea on CPAP   . Presence of indwelling urinary catheter   . Shortness of breath    due to cigarette abuse  . Tobacco abuse     Past Surgical History:  Procedure Laterality Date  . ABDOMINAL AORTIC ENDOVASCULAR FENESTRATED STENT GRAFT N/A 04/09/2016   Procedure: ABDOMINAL AORTIC ENDOVASCULAR FENESTRATED STENT GRAFT with left brachial artery access using ultrasound guidance;  Surgeon: Serafina Mitchell, MD;  Location: [facility];  Service: Vascular;  Laterality: N/A;  . [place]   [place]  . PERIPHERAL VASCULAR CATHETERIZATION N/A 04/10/2016   Procedure: Renal Angiography;  Surgeon: Angelia Mould, MD;  Location: [facility];  Service: Cardiovascular;  Laterality: N/A;  . TRANSURETHRAL RESECTION OF PROSTATE  01/07/2011   Procedure: TRANSURETHRAL RESECTION OF THE PROSTATE (TURP);   Surgeon: Marissa Nestle;  Location: [facility];  Service: Urology;  Laterality: N/A;    Family History  Problem Relation Age of Onset  . Liver disease Father   . Lymphoma Mother   . Diabetes Mother   . Hypertension Mother   . Cancer Mother   . Mental retardation Unknown        sibling -- from birth   . Asthma Daughter   . Allergic rhinitis Neg Hx   . Angioedema Neg Hx   . Atopy Neg Hx   . Eczema Neg Hx   . Immunodeficiency Neg Hx      Social History   Tobacco Use  Smoking Status Current Every Day Smoker  . Packs/day: 3.00  . Years: 50.00  . Pack years: 150.00  . Types: Cigarettes  Smokeless Tobacco Never Used  Tobacco Comment   2ppd 10/30/2016 ee    Social History   Substance and Sexual Activity  Alcohol Use Yes  . Alcohol/week: 0.0 standard drinks   Comment: very little     Allergies  Allergen Reactions  . No Known Allergies     Current Outpatient Medications  Medication Sig Dispense Refill  . albuterol (PROVENTIL HFA;VENTOLIN HFA) 108 (90 BASE) MCG/ACT inhaler Inhale 2 puffs into the lungs every 6 (six) hours as needed (rescue inhaler).     [name] allopurinol (ZYLOPRIM) 300 MG tablet Take 300 mg by mouth daily.      [name] amLODipine (NORVASC) 10 MG tablet TAKE 1 TABLET EVERY DAY 90 tablet 3  . atorvastatin (LIPITOR) 20 MG tablet Take 20 mg by mouth daily.     [name] azelastine (ASTELIN) 0.1 % nasal spray Place 1 spray into both nostrils 2 (two) times daily. Use in each nostril as directed    . clopidogrel (PLAVIX) 75 MG tablet TAKE (1) TABLET BY MOUTH ONCE DAILY. (Patient taking differently: Take 75 mg by mouth daily. ) 30 tablet 0  . Fluticasone-Umeclidin-Vilant (TRELEGY ELLIPTA) 100-62.5-25 MCG/INH AEPB Inhale into the lungs.    . hydrALAZINE (APRESOLINE) 50 MG tablet Take 1 tablet (50 mg total) by mouth 2 (two) times daily. 60 tablet 6  . metFORMIN (GLUCOPHAGE) 500 MG tablet Take 500 mg by mouth 2 (two) times daily with a meal.     . metoprolol succinate (TOPROL-XL) 25  MG 24 hr tablet TAKE 1 TABLET EVERY DAY 90 tablet 3  . sertraline (ZOLOFT) 50 MG tablet Take  50 mg by mouth daily.      [name] torsemide (DEMADEX) 20 MG tablet Take 40 mg by mouth daily.     [name] losartan (COZAAR) 100 MG tablet TAKE 1 TABLET (100 MG TOTAL) BY MOUTH DAILY. 90 tablet 2   Current Facility-Administered Medications  Medication Dose Route Frequency Provider Last Rate Last Dose  . ipratropium (ATROVENT) nebulizer solution 0.5 mg  0.5 mg Nebulization Once Gean Quint, MD      . levalbuterol Penne Lash) nebulizer solution 1.25 mg  1.25 mg Nebulization Once Gean Quint, MD      . omalizumab Arvid Right) injection 375 mg  375 mg Subcutaneous Q14 Days Valentina Shaggy, MD   375 mg at 03/30/18 2297    Pertinent items are noted in HPI.   Review of Systems:     Cardiac Review of Systems: [Y] = yes  or   [ N ] = no   Chest Pain [ n   ]  Resting SOB [ y  ] Exertional SOB  [ y ]  Orthopnea [ n ]   Pedal Edema [ y  ]    Palpitations [ n ] Syncope  [ n ]   Presyncope [ n  ]   General Review of Systems: [Y] = yes [  ]=no Constitional: recent weight change [n  ];  Wt loss over the last 3 months [   ] anorexia [  ]; fatigue Blue.Reese  ]; nausea [  ]; night sweats [  ]; fever [  ]; or chills [  ];           Eye : blurred vision [  ]; diplopia [   ]; vision changes [  ];  Amaurosis fugax[  ]; Resp: cough [  ];  wheezing[  ];  hemoptysis[ y ]; shortness of breath[y  ]; paroxysmal nocturnal dyspnea[  ]; dyspnea on exertion[ y ]; or orthopnea[  ];  GI:  gallstones[  ], vomiting[  ];  dysphagia[  ]; melena[  ];  hematochezia [  ]; heartburn[  ];   Hx of  Colonoscopy[  ]; GU: kidney stones [  ]; hematuria[  ];   dysuria [  ];  nocturia[  ];  history of     obstruction [  ]; urinary frequency [  ]             Skin: rash, swelling[  ];, hair loss[  ];  peripheral edema[  ];  or itching[  ]; Musculosketetal: myalgias[  ];  joint swelling[  ];  joint erythema[  ];  joint pain[y  ];  back pain[ y ];  Heme/Lymph:  bruising[  ];  bleeding[  ];  anemia[  ];  Neuro: TIA[  ];  headaches[  ];  stroke[  ];  vertigo[  ];  seizures[  ];   paresthesias[  ];  difficulty walking[y  ];  Psych:depression[  ]; anxiety[  ];  Endocrine: diabetes[y  ];  thyroid dysfunction[  ];  Immunizations: Flu up to date [  ]; Pneumococcal up to date [  ];  Other:     PHYSICAL EXAMINATION: BP 136/68 (BP Location: Left Arm, Patient Position: Sitting, Cuff Size: Normal)   Pulse 82   Resp 18   Ht 5\' 9"  (1.753 m)   Wt 235 lb (106.6 kg)   SpO2 93% Comment: RA  BMI 34.70 kg/m  General appearance: alert, cooperative and no distress Head: Normocephalic, without obvious abnormality, atraumatic  Neck: no adenopathy, no carotid bruit, no JVD, supple, symmetrical, trachea midline and thyroid not enlarged, symmetric, no tenderness/mass/nodules Lymph nodes: Cervical, supraclavicular, and axillary nodes normal. Resp: rales bilaterally and wheezes bibasilar Back: symmetric, no curvature. ROM normal. No CVA tenderness. Cardio: regular rate and rhythm, S1, S2 normal, no murmur, click, rub or gallop GI: soft, non-tender; bowel sounds normal; no masses,  no organomegaly Extremities: extremities normal, atraumatic, no cyanosis or edema and Homans sign is negative, no sign of DVT Neurologic: Grossly normal  Diagnostic Studies & Laboratory data:     Recent Radiology Findings:   Mr Jeri Cos Wo Contrast  Result Date: 04/01/2018 CLINICAL DATA:  Lung cancer. EXAM: MRI HEAD WITHOUT AND WITH CONTRAST TECHNIQUE: Multiplanar, multiecho pulse sequences of the brain and surrounding structures were obtained without and with intravenous contrast. CONTRAST:  10 mL Gadavist COMPARISON:  MRI brain 04/11/2016 FINDINGS: Brain: Atrophy and white matter changes are similar the prior study. Remote encephalomalacia of the right frontal lobe is again seen. This appears to be posttraumatic with involvement both laterally and inferiorly. Diffuse white matter changes  extend into the brainstem. Cerebellum is unremarkable. The internal auditory canals are within normal limits bilaterally. The postcontrast images demonstrate no pathologic enhancement. Vascular: Flow is present in the major intracranial arteries. Skull and upper cervical spine: The skull base is within normal limits. The craniocervical junction is normal. Marrow signal is normal. Sinuses/Orbits: Fluid levels are present in the maxillary sinuses bilaterally. The remaining paranasal sinuses and the mastoid air cells are clear. Bilateral lens replacements are present. Globes and orbits are within normal limits. IMPRESSION: 1. No evidence for metastatic disease to the brain or meninges. 2. Stable atrophy and white matter disease. This likely reflects the sequela of chronic microvascular ischemia. 3. 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 Electronically Signed   By: San Morelle M.D.   On: 04/01/2018 15:08   Ct Biopsy  Result Date: 03/19/2018 INDICATION: Right upper lobe lung nodule EXAM: CT BIOPSY MEDICATIONS: None. ANESTHESIA/SEDATION: Fentanyl 25 mcg IV; Versed 1 mg IV Moderate Sedation Time:  11 minutes The patient was continuously monitored during the procedure by the interventional radiology nurse under my direct supervision. FLUOROSCOPY TIME:  Fluoroscopy Time:  minutes  seconds ( mGy). COMPLICATIONS: None immediate. PROCEDURE: Informed written consent was obtained from the patient after a thorough discussion of the procedural risks, benefits and alternatives. All questions were addressed. Maximal Sterile Barrier Technique was utilized including caps, mask, sterile gowns, sterile gloves, sterile drape, hand hygiene and skin antiseptic. A timeout was performed prior to the initiation of the procedure. Under CT guidance, a(n) 17 gauge guide needle was advanced into the right upper lobe lung nodule via posterior approach. Subsequently 2 18 gauge core biopsies were  obtained. The guide needle was removed. Post biopsy images demonstrate no pneumothorax. Patient tolerated the procedure well without complication. Vital sign monitoring by nursing staff during the procedure will continue as patient is in the special procedures unit for post procedure observation. FINDINGS: The images document guide needle placement within the right upper lobe lung nodule. Post biopsy images demonstrate no pneumothorax. IMPRESSION: Successful CT-guided core biopsy of a right upper lobe lung nodule. Electronically Signed   By: Marybelle Killings M.D.   On: 03/19/2018 15:32   CLINICAL DATA:  Shortness of breath.  EXAM: CT ANGIOGRAPHY CHEST WITH CONTRAST  TECHNIQUE: Multidetector CT imaging of the chest was performed using the standard protocol during bolus administration of intravenous contrast. Multiplanar  CT image reconstructions and MIPs were obtained to evaluate the vascular anatomy.  CONTRAST:  19mL ISOVUE-370 IOPAMIDOL (ISOVUE-370) INJECTION 76%  COMPARISON:  Chest CT February 20, 2016; chest CT angiogram May 16, 2016; chest radiograph April 23, 2017  FINDINGS: Cardiovascular: There is no demonstrable pulmonary embolus. There is no thoracic aortic aneurysm or dissection. Visualized great vessels show modest and scattered atherosclerotic calcification. Visualized great vessels otherwise appear normal. Note that the right innominate and left common carotid arteries arise as a common trunk, an anatomic variant. There are foci of aortic atherosclerosis. There are foci of coronary artery calcification. There is no pericardial effusion or pericardial thickening evident.  Mediastinum/Nodes: Thyroid appears unremarkable. No appreciable adenopathy is evident. No esophageal lesions are evident.  Lungs/Pleura: There is a degree of underlying centrilobular emphysematous change. There is lower lobe atelectatic change as well as atelectatic change in the posterior segment  of the right upper lobe.  There is an irregular nodular opacity in the posterior segment of the right upper lobe measuring 2.0 x 1.0 x 0.9 cm. This opacity is best seen on axial slice 18 series 6, coronal slice 263 series 7, and sagittal slice 63 series 8. No similar nodular lesions evident elsewhere.  No pleural effusion or pleural thickening evident.  Upper Abdomen: Gallbladder is absent. There is incomplete visualization of a cyst arising from the upper pole right kidney measuring approximately 6 x 6 cm. The superior most aspect of a stent in the abdominal aorta is noted. There is left adrenal hypertrophy and to a lesser extent right adrenal hypertrophy, stable.  Musculoskeletal: No blastic or lytic bone lesions are evident. There are no chest wall lesions.  Review of the MIP images confirms the above findings.  IMPRESSION: 1. Somewhat irregular nodular opacity in the posterior segment of the right upper lobe measuring 2.0 x 1.0 x 0.9 cm. Small neoplasm must be of concern. Advise correlation with nuclear medicine PET study to further assess.  2. No demonstrable pulmonary embolus. No thoracic aortic aneurysm or dissection. There are foci of aortic atherosclerosis as well as coronary artery and great vessel calcification.  3. Underlying centrilobular emphysematous change. Atelectasis evident in both lower lobes and posterior segment left upper lobe. No frank consolidation noted.  4.  No evident thoracic adenopathy.  5. Adrenal hypertrophy bilaterally, more notable on the left than on the right.  6.  Absent gallbladder.  These results will be called to the ordering clinician or representative by the Radiologist Assistant, and communication documented in the PACS or zVision Dashboard.  Aortic Atherosclerosis (ICD10-I70.0) and Emphysema (ICD10-J43.9).   Electronically Signed   By: Lowella Grip III M.D.   On: 02/04/2018 11:12   CLINICAL DATA:   Initial treatment strategy for right upper lobe pulmonary nodule.  EXAM: NUCLEAR MEDICINE PET SKULL BASE TO THIGH  TECHNIQUE: 13.6 mCi F-18 FDG was injected intravenously. Full-ring PET imaging was performed from the skull base to thigh after the radiotracer. CT data was obtained and used for attenuation correction and anatomic localization.  Fasting blood glucose: 173 mg/dl  COMPARISON:  CT chest 02/04/2018  FINDINGS: Mediastinal blood pool activity: SUV max 2.4  NECK: No significant abnormal hypermetabolic activity in this region.  Incidental CT findings: Chronic bilateral maxillary sinusitis, left greater than right.  CHEST: Persistent 2.4 by 0.8 cm apical segment right upper lobe nodule on image 80/3, maximum SUV 2.9.  Incidental CT findings: Coronary, aortic arch, and branch vessel atherosclerotic vascular disease. Centrilobular emphysema. Airway thickening is present,  suggesting bronchitis or reactive airways disease.  ABDOMEN/PELVIS: No significant abnormal hypermetabolic activity in this region.  Incidental CT findings: Aorta bi-iliac stent graft. Bilateral photopenic renal cysts. Diverticula of the transverse duodenum. Sigmoid colon diverticulosis. Mild prostatomegaly.  SKELETON: No significant abnormal hypermetabolic activity in this region.  Incidental CT findings: Bridging spurring of the right sacroiliac joint. Degenerative chondral thinning in both hips. Old left anterior lower rib fractures, healed.  IMPRESSION: 1. The 2.4 by 0.8 cm apical segment right upper lobe nodule has persisted over the last month and has a maximum SUV of 2.9, highly suspicious for malignancy. No adenopathy or metastatic spread is identified. 2. Other imaging findings of potential clinical significance: Aortic Atherosclerosis (ICD10-I70.0) and Emphysema (ICD10-J43.9). Coronary atherosclerosis. Airway thickening is present, suggesting bronchitis or reactive  airways disease. Chronic bilateral maxillary sinusitis. Mild prostatomegaly. Aorta bi-iliac stent graft. Sigmoid colon diverticulosis.   Electronically Signed   By: Van Clines M.D.   On: 03/09/2018 08:51 I have independently reviewed the above radiology studies  and reviewed the findings with the patient.   Recent Lab Findings: Lab Results  Component Value Date   WBC 12.3 (H) 03/19/2018   HGB 16.6 03/19/2018   HCT 48.9 03/19/2018   PLT 160 03/19/2018   GLUCOSE 359 (H) 03/19/2018   CHOL 138 12/16/2010   TRIG 53.0 12/16/2010   HDL 46.10 12/16/2010   LDLCALC 81 12/16/2010   ALT 23 04/02/2016   AST 18 04/02/2016   NA 137 03/19/2018   K 4.1 03/19/2018   CL 100 03/19/2018   CREATININE 1.37 (H) 03/19/2018   BUN 27 (H) 03/19/2018   CO2 27 03/19/2018   INR 0.96 03/19/2018   HGBA1C 6.0 (H) 04/02/2016   PFT's FEV!Conclusions: A reduced diffusing capacity, moderate airway obstruction and overinflation are characteristic of emphysema. The response to bronchodilators indicates a reversible component. In view of the severity of the diffusion defect, studies with exercise would be helpful to evaluate the presence of hypoxemia. Pulmonary Function Diagnosis: Moderate Obstructive Airways Disease -Emphysematous Type, Reversible Component FEV! 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  The patient has significant symptoms of emphysematous COPD with  significant diffusion deficit.  In the office today he is actively wheezing.  Prior to making a final decision about surgical resection will obtain CPX testing, following this will make a decision about surgical resection which would likely require lobectomy versus stereotactic radiotherapy.  I discussed these options in detail with the patient and he is agreeable.  The patient does not seem that he will stop smoking   I  spent 60 minutes with  the patient face to face and greater then 50% of the time was spent in counseling and coordination of care.    Grace Isaac MD      [address] [name] [address] Office [phone]   Beeper [phone]  04/08/2018 8:43 AM

## 2018-04-08 NOTE — Patient Instructions (Signed)
Lung Cancer Lung cancer occurs when abnormal cells in the lung grow out of control and form a mass (tumor). There are several types of lung cancer. The two most common types are:  Non-small cell. In this type of lung cancer, abnormal cells are larger and grow more slowly than those of small cell lung cancer.  Small cell. In this type of lung cancer, abnormal cells are smaller than those of non-small cell lung cancer. Small cell lung cancer gets worse faster than non-small cell lung cancer.  What are the causes? The leading cause of lung cancer is smoking tobacco. The second leading cause is radon exposure. What increases the risk?  Smoking tobacco.  Exposure to secondhand tobacco smoke.  Exposure to radon gas.  Exposure to asbestos.  Exposure to arsenic in drinking water.  Air pollution.  Family or personal history of lung cancer.  Lung radiation therapy.  Being older than 65 years. What are the signs or symptoms? In the early stages, symptoms may not be present. As the cancer progresses, symptoms may include:  A lasting cough, possibly with blood.  Fatigue.  Unexplained weight loss.  Shortness of breath.  Wheezing.  Chest pain.  Loss of appetite.  Symptoms of advanced lung cancer include:  Hoarseness.  Bone or joint pain.  Weakness.  Nail problems.  Face or arm swelling.  Paralysis of the face.  Drooping eyelids.  How is this diagnosed? Lung cancer can be identified with a physical exam and with tests such as:  A chest X-ray.  A CT scan.  Blood tests.  A biopsy.  After a diagnosis is made, you will have more tests to determine the stage of the cancer. The stages of non-small cell lung cancer are:  Stage 0, also called carcinoma in situ. At this stage, abnormal cells are found in the inner lining of your lung or lungs.  Stage I. At this stage, abnormal cells have grown into a tumor that is no larger than 5 cm across. The cancer has entered  the deeper lung tissue but has not yet entered the lymph nodes or other parts of the body.  Stage II. At this stage, the tumor is 7 cm across or smaller and has entered nearby lymph nodes. Or, the tumor is 5 cm across or smaller and has invaded surrounding tissue but is not found in nearby lymph nodes. There may be more than one tumor present.  Stage III. At this stage, the tumor may be any size. There may be more than one tumor in the lungs. The cancer cells have spread to the lymph nodes and possibly to other organs.  Stage IV. At this stage, there are tumors in both lungs and the cancer has spread to other areas of the body.  The stages of small cell lung cancer are:  Limited. At this stage, the cancer is found only on one side of the chest.  Extensive. At this stage, the cancer is in the lungs and in tissues on the other side of the chest. The cancer has spread to other organs or is found in the fluid between the layers of your lungs.  How is this treated? Depending on the type and stage of your lung cancer, you may be treated with:  Surgery. This is done to remove a tumor.  Radiation therapy. This treatment destroys cancer cells using X-rays or other types of radiation.  Chemotherapy. This treatment uses medicines to destroy cancer cells.  Targeted therapy. This treatment   aims to destroy only cancer cells instead of all cells as other therapies do.  You may also have a combination of treatments. Follow these instructions at home:  Do not use any tobacco products. This includes cigarettes, chewing tobacco, and electronic cigarettes. If you need help quitting, ask your health care provider.  Take medicines only as directed by your health care provider.  Eat a healthy diet. Work with a dietitian to make sure you are getting the nutrition you need.  Consider joining a support group or seeking counseling to help you cope with the stress of having lung cancer.  Let your cancer  specialist (oncologist) know if you are admitted to the hospital.  Keep all follow-up visits as directed by your health care provider. This is important. Contact a health care provider if:  You lose weight without trying.  You have a persistent cough and wheezing.  You feel short of breath.  You tire easily.  You experience bone or joint pain.  You have difficulty swallowing.  You feel hoarse or notice your voice changing.  Your pain medicine is not helping. Get help right away if:  You cough up blood.  You have new breathing problems.  You develop chest pain.  You develop swelling in: ? One or both ankles or legs. ? Your face, neck, or arms.  You are confused.  You experience paralysis in your face or a drooping eyelid. This information is not intended to replace advice given to you by your health care provider. Make sure you discuss any questions you have with your health care provider. Document Released: 09/15/2000 Document Revised: 11/15/2015 Document Reviewed: 10/13/2013 Elsevier Interactive Patient Education  2018 [employer].  Lung Resection A lung resection is a procedure to remove part or all of a lung. When an entire lung is removed, the procedure is called a pneumonectomy. When only part of a lung is removed, the procedure is called a lobectomy. A lung resection is typically done to get rid of a tumor or cancer, but it may be done to treat other conditions. This procedure can help relieve some or all of your symptoms and can also help keep the problem from getting worse. Lung resection may provide the best chance for curing your disease. However, the procedure may not necessarily cure lung cancer if that is the problem. Tell a health care provider about:  Any allergies you have.  All medicines you are taking, including vitamins, herbs, eye drops, creams, and over-the-counter medicines.  Any problems you or family members have had with anesthetic  medicines.  Any blood disorders you have.  Any surgeries you have had.  Any medical conditions you have. What are the risks? Generally, lung resection is a safe procedure. However, problems can occur and include:  Excessive bleeding.  Infection.  Inability to breathe without a ventilator.  Persistent shortness of breath.  Heart problems, including abnormal rhythms and a risk of heart attack or heart failure.  Blood clots.  Injury to a blood vessel.  Injury to a nerve.  Failure to heal properly.  Stroke.  Bronchopleural fistula. This is a small hole between one of the main breathing tubes (bronchus) and the lining of the lungs. This is rare.  Reaction to anesthesia.  What happens before the procedure? You may have tests done before the procedure, including:  Blood tests.  Urine tests.  X-rays.  Other imaging tests (such as CT scans, MRI scans, and PET scans). These tests are done to  find the exact size and location of the condition being treated with this surgery.  Pulmonary function tests. These are breathing tests to assess the function of your lungs before surgery and to decide how to best help your breathing after surgery.  Heart testing. This is done to make sure your heart is strong enough for the procedure.  Bronchoscopy. This is a technique that allows your health care provider to look at the inside of your airways. This is done using a soft, flexible tube (bronchoscope). Along with imaging tests, this can help your health care provider know the exact location and size of the area that will be removed during surgery.  Lymph node sampling. This may need to be done to see if the tumor has spread. It may be done as a separate surgery or right before your lung resection procedure.  What happens during the procedure?  An IV tube will be placed in your arm. You will be given a medicine that makes you fall asleep (general anesthetic). You may also get pain  medicine through a thin, flexible tube (catheter) in your back.  A breathing tube will be placed in your throat.  Once the surgical team has prepared you for surgery, your surgeon will make an incision on your side. Some resections are done through large incisions, while others can be done through small incisions using smaller instruments and assisted with small cameras (laparoscopic surgery).  Your surgeon will carefully cut the veins, arteries, and bronchus leading to your lung. After being cut, each of these pieces will be sewn or stapled closed. The lung or part of the lung will then be removed.  Your surgeon will check inside your chest to make sure there is no bleeding in or around the lungs. Lymph nodes near the lung may also be removed for later tests.  Your surgeon may put tubes into your chest to drain extra fluid and air after surgery.  Your incision will be closed. This may be done using: ? Stitches that absorb into your body and do not need to be removed. ? Stitches that must be removed. ? Staples that must be removed. What happens after the procedure?  You will be taken to the recovery area and your progress will be monitored. You may still have a breathing tube and other tubes or catheters in your body immediately after surgery. These will be removed during your recovery. You may be put on a respirator following surgery if some assistance is needed to help your breathing. When you are awake and not experiencing immediate problems from surgery, you will be moved to the intensive care unit (ICU) where you will continue your recovery.  You may feel pain in your chest and throat. Sometimes during recovery, patients may shiver or feel nauseous. You will be given medicine to help with pain and nausea.  The breathing tube will be taken out as soon as your health care providers feel you can breathe on your own. For most people, this happens on the same day as the surgery.  If your  surgery and time in the ICU go well, most of the tubes and equipment will be taken out within 1-2 days after surgery. This is about how long most people stay in the ICU. You may need to stay longer, depending on how you are doing.  You should also start respiratory therapy in the ICU. This therapy uses breathing exercises to help your other lung stay healthy and get stronger.  As you improve, you will be moved to a regular hospital room for continued respiratory therapy, help with your bladder and bowels, and to continue medicines.  After your lung or part of your lung is taken out, there will be a space inside your chest. This space will often fill up with fluid over time. The amount of time this takes is different for each person.  You will receive care until you are doing well and your health care provider feels it is safe for you to go home or to transfer to an extended care facility. This information is not intended to replace advice given to you by your health care provider. Make sure you discuss any questions you have with your health care provider. Document Released: 08/30/2002 Document Revised: 11/15/2015 Document Reviewed: 07/29/2013 Elsevier Interactive Patient Education  2018 [employer].

## 2018-04-14 ENCOUNTER — Ambulatory Visit: Payer: Self-pay

## 2018-04-15 ENCOUNTER — Other Ambulatory Visit (HOSPITAL_COMMUNITY): Payer: Self-pay | Admitting: *Deleted

## 2018-04-15 ENCOUNTER — Ambulatory Visit (HOSPITAL_COMMUNITY): Payer: Medicare HMO | Attending: Cardiothoracic Surgery

## 2018-04-15 DIAGNOSIS — Z01818 Encounter for other preprocedural examination: Secondary | ICD-10-CM

## 2018-04-15 DIAGNOSIS — C3490 Malignant neoplasm of unspecified part of unspecified bronchus or lung: Secondary | ICD-10-CM

## 2018-04-15 DIAGNOSIS — C3411 Malignant neoplasm of upper lobe, right bronchus or lung: Secondary | ICD-10-CM | POA: Diagnosis not present

## 2018-04-19 ENCOUNTER — Other Ambulatory Visit: Payer: Self-pay

## 2018-04-19 ENCOUNTER — Ambulatory Visit: Payer: Medicare HMO | Admitting: Cardiothoracic Surgery

## 2018-04-19 ENCOUNTER — Encounter: Payer: Self-pay | Admitting: Cardiothoracic Surgery

## 2018-04-19 VITALS — BP 121/87 | HR 80 | Resp 18 | Ht 69.0 in | Wt 235.0 lb

## 2018-04-19 DIAGNOSIS — C3491 Malignant neoplasm of unspecified part of right bronchus or lung: Secondary | ICD-10-CM | POA: Diagnosis not present

## 2018-04-19 DIAGNOSIS — C3411 Malignant neoplasm of upper lobe, right bronchus or lung: Secondary | ICD-10-CM

## 2018-04-19 NOTE — Progress Notes (Signed)
[address]       ,[place] [address]             [phone]                    Markevious H [place] Medical Record #[id number] Date of Birth: [birth year]  Referring: Derek Jack, MD Primary Care: Sinda Du, MD Primary Cardiologist: No primary care provider on file.  Chief Complaint:    Chief Complaint  Patient presents with  . Follow-up    after cardiopulmonary exercise testing    History of Present Illness:    COAL NEARHOOD 77 y.o. male is seen in the office last week for recent diagnosis of squamous cell carcinoma of the right upper lobe.  The patient is a long-term smoker and currently smokes for over 65 years.  Because of worsening shortness of breath over the summer he had work-up including CT scan of the chest to rule out PE and an echocardiogram.  CT scan of the chest showed a 2 x 1 x 0.9 cm right upper lobe lung nodule, no evidence of pulmonary emboli.  PET scan was done September 16 confirming the nodule with SUV of 2.9, there was no evidence of metastatic disease.  CT needle biopsy was done on 03/19/2018 demonstrating squamous cell carcinoma.   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     2 years ago he had endovascular abdominal aortic aneurysm repair and this including right renal artery fenestration and ultimately because of rising creatinine stenting of his left renal artery.  Patient is on chronic Plavix.  Pulmonary function studies were performed recently demonstrating significant diffusion deficit.  Patient does admit to shortness of breath with exertion, he is unable to do the work that he previously did due to shortness of breath.  He notes he has frequent coughing in the morning.  He has episodes of wheezing. The patient continues to smoke and notes that he doubts that he will stop.  Because of  his standard PFTs a CPX test was performed and the patient returns today to discuss the results and make a decision about treatment of his right upper lobe nodule, squamous cell carcinoma by needle biopsy.   Current Activity/ Functional Status:  Patient is independent with mobility/ambulation, transfers, ADL's, IADL's.   Zubrod Score: At the time of surgery this patient's most appropriate activity status/level should be described as: []     0    Normal activity, no symptoms [x]     1    Restricted in physical strenuous activity but ambulatory, able to do out light work []     2    Ambulatory and capable of self care, unable to do work activities, up and about               >50 % of waking hours                              []     3    Only limited self care, in bed greater than 50% of waking hours []     4    Completely disabled, no self care, confined to bed or chair []     5    Moribund   Past Medical History:  Diagnosis Date  . AAA (abdominal aortic aneurysm) ([place]) 01/2008   3.2cm  . Asthma   . BPH (benign prostatic hyperplasia)   . Chronic knee pain    bilateral  . COPD (chronic obstructive pulmonary disease) ([place])   . Diabetes mellitus    type II  . Gout   . History of kidney stones   . Hypercholesterolemia   . Hyperlipidemia   . Hypertension   . Normal nuclear stress test 2011  . Obesity   . Obstructive sleep apnea on CPAP   . Presence of indwelling urinary catheter   . Shortness of breath    due to cigarette abuse  . Tobacco abuse     Past Surgical History:  Procedure Laterality Date  . ABDOMINAL AORTIC ENDOVASCULAR FENESTRATED STENT GRAFT N/A 04/09/2016   Procedure: ABDOMINAL AORTIC ENDOVASCULAR FENESTRATED STENT GRAFT with left brachial artery access using ultrasound guidance;  Surgeon: Serafina Mitchell, MD;  Location: [facility];  Service: Vascular;  Laterality: N/A;  . [place]   [place]  . PERIPHERAL VASCULAR CATHETERIZATION N/A 04/10/2016   Procedure: Renal  Angiography;  Surgeon: Angelia Mould, MD;  Location: [facility];  Service: Cardiovascular;  Laterality: N/A;  . TRANSURETHRAL RESECTION OF PROSTATE  01/07/2011   Procedure: TRANSURETHRAL RESECTION OF THE PROSTATE (TURP);  Surgeon: Marissa Nestle;  Location: [facility];  Service: Urology;  Laterality: N/A;    Family History  Problem Relation Age of Onset  . Liver disease Father   . Lymphoma Mother   . Diabetes Mother   . Hypertension Mother   . Cancer Mother   . Mental retardation Unknown        sibling -- from birth   . Asthma Daughter   . Allergic rhinitis Neg Hx   . Angioedema Neg Hx   . Atopy Neg Hx   . Eczema Neg Hx   . Immunodeficiency Neg Hx      Social History   Tobacco Use  Smoking Status Current Every Day Smoker  . Packs/day: 3.00  . Years: 50.00  . Pack years: 150.00  . Types: Cigarettes  Smokeless Tobacco Never Used  Tobacco Comment   2ppd 10/30/2016 ee    Social History   Substance and Sexual Activity  Alcohol Use Yes  . Alcohol/week: 0.0 standard drinks   Comment: very little     Allergies  Allergen Reactions  . No Known Allergies     Current Outpatient Medications  Medication Sig Dispense Refill  . albuterol (PROVENTIL HFA;VENTOLIN HFA) 108 (90 BASE) MCG/ACT inhaler Inhale 2 puffs into the lungs every 6 (six) hours as needed (rescue inhaler).     [name] allopurinol (ZYLOPRIM) 300 MG tablet Take 300 mg by mouth daily.      [name] amLODipine (NORVASC) 10 MG tablet TAKE 1 TABLET EVERY DAY 90 tablet 3  . atorvastatin (LIPITOR) 20 MG tablet Take 20 mg by mouth daily.     [name] azelastine (ASTELIN) 0.1 % nasal spray Place 1 spray into both nostrils 2 (two) times daily. Use in each nostril as directed    . Fluticasone-Umeclidin-Vilant (TRELEGY ELLIPTA) 100-62.5-25 MCG/INH AEPB Inhale into the lungs.    . hydrALAZINE (APRESOLINE) 50 MG tablet Take 1 tablet (50 mg total) by mouth 2 (two) times daily. 60 tablet 6  . losartan (COZAAR) 100 MG tablet TAKE 1  TABLET (100 MG TOTAL) BY MOUTH DAILY. 90 tablet 2  . metFORMIN (GLUCOPHAGE) 500 MG tablet Take 500 mg by mouth  2 (two) times daily with a meal.     . metoprolol succinate (TOPROL-XL) 25 MG 24 hr tablet TAKE 1 TABLET EVERY DAY 90 tablet 3  . sertraline (ZOLOFT) 50 MG tablet Take 50 mg by mouth daily.      [name] torsemide (DEMADEX) 20 MG tablet Take 40 mg by mouth daily.     . clopidogrel (PLAVIX) 75 MG tablet TAKE (1) TABLET BY MOUTH ONCE DAILY. (Patient not taking: No sig reported) 30 tablet 0   Current Facility-Administered Medications  Medication Dose Route Frequency Provider Last Rate Last Dose  . ipratropium (ATROVENT) nebulizer solution 0.5 mg  0.5 mg Nebulization Once Gean Quint, MD      . levalbuterol Penne Lash) nebulizer solution 1.25 mg  1.25 mg Nebulization Once Gean Quint, MD      . omalizumab Arvid Right) injection 375 mg  375 mg Subcutaneous Q14 Days Valentina Shaggy, MD   375 mg at 03/30/18 3419    Pertinent items are noted in HPI.   Review of Systems:     Cardiac Review of Systems: [Y] = yes  or   [ N ] = no   Chest Pain [ N  ]  Resting SOB [ Y ] Exertional SOB  [ Y ]  Orthopnea Aqua.Slicker ]   Pedal Edema [Y ]    Palpitations [N] Syncope  [ N]   Presyncope [ N ]   General Review of Systems: [Y] = yes [  ]=no Constitional: recent weight change [n  ];  Wt loss over the last 3 months [   ] anorexia [  ]; fatigue Blue.Reese  ]; nausea [  ]; night sweats [  ]; fever [  ]; or chills [  ];           Eye : blurred vision [  ]; diplopia [   ]; vision changes [  ];  Amaurosis fugax[  ]; Resp: cough [  ];  wheezing[  ];  hemoptysis[ y ]; shortness of breath[y  ]; paroxysmal nocturnal dyspnea[  ]; dyspnea on exertion[ y ]; or orthopnea[  ];  GI:  gallstones[  ], vomiting[  ];  dysphagia[  ]; melena[  ];  hematochezia [  ]; heartburn[  ];   Hx of  Colonoscopy[  ]; GU: kidney stones [  ]; hematuria[  ];   dysuria [  ];  nocturia[  ];  history of     obstruction [  ]; urinary frequency [  ]              Skin: rash, swelling[  ];, hair loss[  ];  peripheral edema[  ];  or itching[  ]; Musculosketetal: myalgias[  ];  joint swelling[  ];  joint erythema[  ];  joint pain[y  ];  back pain[ y ];  Heme/Lymph: bruising[  ];  bleeding[  ];  anemia[  ];  Neuro: TIA[  ];  headaches[  ];  stroke[  ];  vertigo[  ];  seizures[  ];   paresthesias[  ];  difficulty walkingY  ];  Psych:depression[  ]; anxiety[  ];  Endocrine: diabetes[y  ];  thyroid dysfunction[  ];  Immunizations: Flu up to date [  ]; Pneumococcal up to date [  ];  Other:     PHYSICAL EXAMINATION: BP 121/87 (BP Location: Right Arm, Patient Position: Sitting, Cuff Size: Large)   Pulse 80   Resp 18   Ht 5\' 9"  (1.753 m)  Wt 235 lb (106.6 kg)   SpO2 91% Comment: ON RA  BMI 34.70 kg/m   General appearance: alert, cooperative and appears older than stated age Head: Normocephalic, without obvious abnormality, atraumatic Neck: no adenopathy, no carotid bruit, no JVD, supple, symmetrical, trachea midline and thyroid not enlarged, symmetric, no tenderness/mass/nodules Lymph nodes: Cervical, supraclavicular, and axillary nodes normal. Resp: diminished breath sounds bilaterally Cardio: regular rate and rhythm, S1, S2 normal, no murmur, click, rub or gallop GI: soft, non-tender; bowel sounds normal; no masses,  no organomegaly Extremities: extremities normal, atraumatic, no cyanosis or edema Neurologic: Grossly normal Diagnostic Studies & Laboratory data:     Recent Radiology Findings:   Mr Jeri Cos Wo Contrast  Result Date: 04/01/2018 CLINICAL DATA:  Lung cancer. EXAM: MRI HEAD WITHOUT AND WITH CONTRAST TECHNIQUE: Multiplanar, multiecho pulse sequences of the brain and surrounding structures were obtained without and with intravenous contrast. CONTRAST:  10 mL Gadavist COMPARISON:  MRI brain 04/11/2016 FINDINGS: Brain: Atrophy and white matter changes are similar the prior study. Remote encephalomalacia of the right frontal lobe is  again seen. This appears to be posttraumatic with involvement both laterally and inferiorly. Diffuse white matter changes extend into the brainstem. Cerebellum is unremarkable. The internal auditory canals are within normal limits bilaterally. The postcontrast images demonstrate no pathologic enhancement. Vascular: Flow is present in the major intracranial arteries. Skull and upper cervical spine: The skull base is within normal limits. The craniocervical junction is normal. Marrow signal is normal. Sinuses/Orbits: Fluid levels are present in the maxillary sinuses bilaterally. The remaining paranasal sinuses and the mastoid air cells are clear. Bilateral lens replacements are present. Globes and orbits are within normal limits. IMPRESSION: 1. No evidence for metastatic disease to the brain or meninges. 2. Stable atrophy and white matter disease. This likely reflects the sequela of chronic microvascular ischemia. 3. 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 Electronically Signed   By: San Morelle M.D.   On: 04/01/2018 15:08   Ct Biopsy  Result Date: 03/19/2018 INDICATION: Right upper lobe lung nodule EXAM: CT BIOPSY MEDICATIONS: None. ANESTHESIA/SEDATION: Fentanyl 25 mcg IV; Versed 1 mg IV Moderate Sedation Time:  11 minutes The patient was continuously monitored during the procedure by the interventional radiology nurse under my direct supervision. FLUOROSCOPY TIME:  Fluoroscopy Time:  minutes  seconds ( mGy). COMPLICATIONS: None immediate. PROCEDURE: Informed written consent was obtained from the patient after a thorough discussion of the procedural risks, benefits and alternatives. All questions were addressed. Maximal Sterile Barrier Technique was utilized including caps, mask, sterile gowns, sterile gloves, sterile drape, hand hygiene and skin antiseptic. A timeout was performed prior to the initiation of the procedure. Under CT guidance, a(n) 17 gauge guide  needle was advanced into the right upper lobe lung nodule via posterior approach. Subsequently 2 18 gauge core biopsies were obtained. The guide needle was removed. Post biopsy images demonstrate no pneumothorax. Patient tolerated the procedure well without complication. Vital sign monitoring by nursing staff during the procedure will continue as patient is in the special procedures unit for post procedure observation. FINDINGS: The images document guide needle placement within the right upper lobe lung nodule. Post biopsy images demonstrate no pneumothorax. IMPRESSION: Successful CT-guided core biopsy of a right upper lobe lung nodule. Electronically Signed   By: Marybelle Killings M.D.   On: 03/19/2018 15:32   CLINICAL DATA:  Shortness of breath.  EXAM: CT ANGIOGRAPHY CHEST WITH CONTRAST  TECHNIQUE: Multidetector CT imaging of the  chest was performed using the standard protocol during bolus administration of intravenous contrast. Multiplanar CT image reconstructions and MIPs were obtained to evaluate the vascular anatomy.  CONTRAST:  63mL ISOVUE-370 IOPAMIDOL (ISOVUE-370) INJECTION 76%  COMPARISON:  Chest CT February 20, 2016; chest CT angiogram May 16, 2016; chest radiograph April 23, 2017  FINDINGS: Cardiovascular: There is no demonstrable pulmonary embolus. There is no thoracic aortic aneurysm or dissection. Visualized great vessels show modest and scattered atherosclerotic calcification. Visualized great vessels otherwise appear normal. Note that the right innominate and left common carotid arteries arise as a common trunk, an anatomic variant. There are foci of aortic atherosclerosis. There are foci of coronary artery calcification. There is no pericardial effusion or pericardial thickening evident.  Mediastinum/Nodes: Thyroid appears unremarkable. No appreciable adenopathy is evident. No esophageal lesions are evident.  Lungs/Pleura: There is a degree of underlying  centrilobular emphysematous change. There is lower lobe atelectatic change as well as atelectatic change in the posterior segment of the right upper lobe.  There is an irregular nodular opacity in the posterior segment of the right upper lobe measuring 2.0 x 1.0 x 0.9 cm. This opacity is best seen on axial slice 18 series 6, coronal slice 035 series 7, and sagittal slice 63 series 8. No similar nodular lesions evident elsewhere.  No pleural effusion or pleural thickening evident.  Upper Abdomen: Gallbladder is absent. There is incomplete visualization of a cyst arising from the upper pole right kidney measuring approximately 6 x 6 cm. The superior most aspect of a stent in the abdominal aorta is noted. There is left adrenal hypertrophy and to a lesser extent right adrenal hypertrophy, stable.  Musculoskeletal: No blastic or lytic bone lesions are evident. There are no chest wall lesions.  Review of the MIP images confirms the above findings.  IMPRESSION: 1. Somewhat irregular nodular opacity in the posterior segment of the right upper lobe measuring 2.0 x 1.0 x 0.9 cm. Small neoplasm must be of concern. Advise correlation with nuclear medicine PET study to further assess.  2. No demonstrable pulmonary embolus. No thoracic aortic aneurysm or dissection. There are foci of aortic atherosclerosis as well as coronary artery and great vessel calcification.  3. Underlying centrilobular emphysematous change. Atelectasis evident in both lower lobes and posterior segment left upper lobe. No frank consolidation noted.  4.  No evident thoracic adenopathy.  5. Adrenal hypertrophy bilaterally, more notable on the left than on the right.  6.  Absent gallbladder.  These results will be called to the ordering clinician or representative by the Radiologist Assistant, and communication documented in the PACS or zVision Dashboard.  Aortic Atherosclerosis (ICD10-I70.0) and  Emphysema (ICD10-J43.9).   Electronically Signed   By: Lowella Grip III M.D.   On: 02/04/2018 11:12   CLINICAL DATA:  Initial treatment strategy for right upper lobe pulmonary nodule.  EXAM: NUCLEAR MEDICINE PET SKULL BASE TO THIGH  TECHNIQUE: 13.6 mCi F-18 FDG was injected intravenously. Full-ring PET imaging was performed from the skull base to thigh after the radiotracer. CT data was obtained and used for attenuation correction and anatomic localization.  Fasting blood glucose: 173 mg/dl  COMPARISON:  CT chest 02/04/2018  FINDINGS: Mediastinal blood pool activity: SUV max 2.4  NECK: No significant abnormal hypermetabolic activity in this region.  Incidental CT findings: Chronic bilateral maxillary sinusitis, left greater than right.  CHEST: Persistent 2.4 by 0.8 cm apical segment right upper lobe nodule on image 80/3, maximum SUV 2.9.  Incidental CT findings: Coronary,  aortic arch, and branch vessel atherosclerotic vascular disease. Centrilobular emphysema. Airway thickening is present, suggesting bronchitis or reactive airways disease.  ABDOMEN/PELVIS: No significant abnormal hypermetabolic activity in this region.  Incidental CT findings: Aorta bi-iliac stent graft. Bilateral photopenic renal cysts. Diverticula of the transverse duodenum. Sigmoid colon diverticulosis. Mild prostatomegaly.  SKELETON: No significant abnormal hypermetabolic activity in this region.  Incidental CT findings: Bridging spurring of the right sacroiliac joint. Degenerative chondral thinning in both hips. Old left anterior lower rib fractures, healed.  IMPRESSION: 1. The 2.4 by 0.8 cm apical segment right upper lobe nodule has persisted over the last month and has a maximum SUV of 2.9, highly suspicious for malignancy. No adenopathy or metastatic spread is identified. 2. Other imaging findings of potential clinical significance: Aortic Atherosclerosis  (ICD10-I70.0) and Emphysema (ICD10-J43.9). Coronary atherosclerosis. Airway thickening is present, suggesting bronchitis or reactive airways disease. Chronic bilateral maxillary sinusitis. Mild prostatomegaly. Aorta bi-iliac stent graft. Sigmoid colon diverticulosis.   Electronically Signed   By: Van Clines M.D.   On: 03/09/2018 08:51 I have independently reviewed the above radiology studies  and reviewed the findings with the patient.   Recent Lab Findings: Lab Results  Component Value Date   WBC 12.3 (H) 03/19/2018   HGB 16.6 03/19/2018   HCT 48.9 03/19/2018   PLT 160 03/19/2018   GLUCOSE 359 (H) 03/19/2018   CHOL 138 12/16/2010   TRIG 53.0 12/16/2010   HDL 46.10 12/16/2010   LDLCALC 81 12/16/2010   ALT 23 04/02/2016   AST 18 04/02/2016   NA 137 03/19/2018   K 4.1 03/19/2018   CL 100 03/19/2018   CREATININE 1.37 (H) 03/19/2018   BUN 27 (H) 03/19/2018   CO2 27 03/19/2018   INR 0.96 03/19/2018   HGBA1C 6.0 (H) 04/02/2016   PFT's FEV!Conclusions: A reduced diffusing capacity, moderate airway obstruction and overinflation are characteristic of emphysema. The response to bronchodilators indicates a reversible component. In view of the severity of the diffusion defect, studies with exercise would be helpful to evaluate the presence of hypoxemia. Pulmonary Function Diagnosis: Moderate Obstructive Airways Disease -Emphysematous Type, Reversible Component FEV! 1.87 62% DLCO 15.4 44% 6 min walk test Walked 1176 feet sats to 95  Borg 3  CPX Testing: Referred for: Surgical Risk Stratification (lobectomy)  Procedure: This patient underwent staged symptom-limited exercise testing using an individualized bike protocol with expired gas analysis metabolic evaluation during exercise.  Demographics  Age: 75 Ht. (in.) 48 Wt. (lb) 233.5 BMI: 34.5   Predicted Peak VO2: 17.8  Gender: Male Ht (cm) 175.3 Wt. (kg) 105.9    Results  Pre-Exercise PFTs   FVC 3.25 (84%)      FEV1 1.61 (55%)      FEV1/FVC 49 (65%)      MVV 66 (57%)      Exercise Time:  10:30  Watts: 100  RPE: 17  Reason stopped: patient ended test due to dyspnea (8/10)  Additional symptoms: None reported  Resting HR: 62 Peak HR: 134  (94% age predicted max HR)  BP rest: 138/62 BP peak: 236/64  Peak VO2: 15.2 (85% predicted peak VO2)  VE/VCO2 slope: 28  OUES: 2.31  Peak RER: 1.10  Ventilatory Threshold: 12.7 (71% predicted and 84% measured peak VO2)  Peak RR 34  Peak Ventilation: 54  VE/MVV: 82%  PETCO2 at peak: 37  O2pulse: 14  (108% predicted O2pulse)   Interpretation  Notes: Patient gave a very good effort. Pulse-oximetry decreased from to 97% to 87% at  peak exercise with recovery to 98% post-exercise. Exercise was performed on a cycle ergometer starting at [facility] and increasing by 10W/min.  ECG: Resting ECG in normal sinus rhythm. HR response appropriate. There were no sustained arrhythmias or ST-T changes. BP hypertensive at peak exercise.   PFT: Pre-exercise spirometry was within normal limits. The MVV was normal.   CPX: Exercise testing with gas exchange demonstrates a normal peak VO2 of 15.2 ml/kg/min (85% of the age/gender/weight matched sedentary norms). The RER of 1.10 indicates a maximal effort. When adjusted to the patient's ideal body weight of 171.4 lb (77.8 kg) the peak VO2 is 20.7 ml/kg (ibw)/min (94% of the ibw-adjusted predicted). The VE/VCO2 slope is is normal. The oxygen uptake efficiency slope (OUES) is normal. The VO2 at the ventilatory threshold was normal at 71% of the predicted peak VO2 and 84% measured PVO2. At peak exercise, the ventilation reached 82% of the measured MVV indicating ventilatory reserve was nearly depleted. The O2pulse (a surrogate for stroke volume) increased with incremental exercise, reaching peak of 14 ml/beat (108% predicted).    Conclusion: Exercise testing with gas exchange demonstrates  normal functional capacity when compared to matched sedentary norms. Pre-exercise spirometry demonstrates moderate obstruction in pulmonary patterns. There was a hypertensive response to exercise with high ventilation. Overall, PVO2 is within a normal range and patient does not demonstrate significant risk for complication.    Test, report and preliminary impression by: Landis Martins, MS, ACSM-RCEP 04/15/2018 1:59 PM  PRELIMINARY CPX RESULTS, FINALIZED RESULTS WILL BE FORWARDED WHEN COMPLETED BY INTERPRETING PHYSICIAN.   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  The patient has significant symptoms of emphysematous COPD with significant diffusion deficit.  The patient is not willing to stop smoking  The patient CPX testing is not prohibitive for lung resection.  However he does have significant dyspnea with exertion.  His wife notes that this has been progressively getting worse over the past several months.  Considering his overall status I discussed with him the risks of surgery versus the risk of stereotactic radiotherapy.  After extensive discussion with he and his wife he will proceed with radiation oncology consultation for consider consideration of stereotactic radiotherapy to his right upper lobe lung mass.     Grace Isaac MD      [address] [place] [address] Office [phone]   Beeper [phone]  04/19/2018 7:24 PM

## 2018-04-21 ENCOUNTER — Ambulatory Visit (INDEPENDENT_AMBULATORY_CARE_PROVIDER_SITE_OTHER): Payer: Medicare HMO | Admitting: *Deleted

## 2018-04-21 DIAGNOSIS — J4540 Moderate persistent asthma, uncomplicated: Secondary | ICD-10-CM

## 2018-04-23 ENCOUNTER — Encounter: Payer: Self-pay | Admitting: Radiation Oncology

## 2018-04-23 NOTE — Progress Notes (Signed)
Thoracic Location of Tumor / Histology: Right Upper lobe lung  Patient presented with worsening shortness of breath with exertion.  Unable to do the work her previously did, he notes he has frequent coughing in the morning and episodes of wheezing.  MRI Brain 04/01/2018: No evidence for metastatic disease to the brain or meninges.  Stable atrophy and white matter disease.  This likely reflects the sequela of chronic microvascular ischemia.  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  PET 03/08/2018: Persistent 2.4 x 0.8 cm apical segment right upper lobe nodule, maximum SUV of 2.9, highly suspicious for malignancy.  No adenopathy or metastatic spread is identified.  CTA Chest 02/04/2018: 2 x 1 x 0.9 cm right upper lobe lung nodule, no evidence of pulmonary emboli.   Biopsies of Right upper lung 03/19/2018   Tobacco/Marijuana/Snuff/ETOH use: current smoker  Past/Anticipated interventions by cardiothoracic surgery, if any:  Dr. 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  The patient is not willing to stop smoking. -The patient CPX testing is not prohibitive for lung resection. -However he does have significant dyspnea with exertion. -His wife notes that this has been progressively getting worse over the past several months. -Considering his overall status I discussed with him the risks of surgery versus the risk of stereotactic radiotherapy. -After extensive discussion with he and his wife he will proceed with radiation oncology consultation for consideration of stereotactic radiotherapy to his right upper lobe lung mass.  Past/Anticipated interventions by medical oncology, if any:  Dr. Delton Coombes 03/29/2018 -I have discussed the findings on the CT scan and the pathology report with the patient and his family in detail. -I will make a referral to CT surgery for possible resection.  If he  is not a surgical candidate, radiation would be the next best option. -I will order MRI of the brain with and without gadolinium to complete staging work-up. -I will see him back in 4 weeks after his surgery to discuss the pathology report and further follow-ups.   Signs/Symptoms  Weight changes, if any: No  Respiratory complaints, if any: SOB with activities  Hemoptysis, if any: Productive cough, clear/yello phlegm, no blood noted.  Pain issues, if any:  Right rib pain  BP (!) 159/78 (BP Location: Left Arm)   Pulse 65   Temp 97.9 F (36.6 C) (Oral)   Resp 20   Ht 5\' 10"  (1.778 m)   Wt 236 lb 6 oz (107.2 kg)   SpO2 100%   BMI 33.92 kg/m    Wt Readings from Last 3 Encounters:  04/27/18 236 lb 6 oz (107.2 kg)  04/19/18 235 lb (106.6 kg)  04/08/18 235 lb (106.6 kg)    SAFETY ISSUES:  Prior radiation? No  Pacemaker/ICD? No  Possible current pregnancy? No  Is the patient on methotrexate? No  Current Complaints / other details:   -On chronic plavix: endovascular abdominal aortic aneurysm repair, including renal artery fenestration and ultimately because of rising creatinine stenting of his left renal artery 2017

## 2018-04-27 ENCOUNTER — Encounter (HOSPITAL_COMMUNITY): Payer: Self-pay | Admitting: Hematology

## 2018-04-27 ENCOUNTER — Ambulatory Visit
Admission: RE | Admit: 2018-04-27 | Discharge: 2018-04-27 | Disposition: A | Discharge status: Home / Self Care | Payer: Medicare HMO | Source: Ambulatory Visit | Attending: Radiation Oncology | Admitting: Radiation Oncology

## 2018-04-27 ENCOUNTER — Inpatient Hospital Stay (HOSPITAL_COMMUNITY): Payer: Medicare HMO | Attending: Hematology | Admitting: Hematology

## 2018-04-27 ENCOUNTER — Encounter: Payer: Self-pay | Admitting: Radiation Oncology

## 2018-04-27 ENCOUNTER — Other Ambulatory Visit: Payer: Self-pay

## 2018-04-27 VITALS — BP 159/78 | HR 65 | Temp 97.9°F | Resp 20 | Ht 70.0 in | Wt 236.4 lb

## 2018-04-27 VITALS — BP 140/61 | HR 72 | Temp 98.2°F | Resp 20 | Wt 235.0 lb

## 2018-04-27 DIAGNOSIS — Z79899 Other long term (current) drug therapy: Secondary | ICD-10-CM | POA: Diagnosis not present

## 2018-04-27 DIAGNOSIS — C3411 Malignant neoplasm of upper lobe, right bronchus or lung: Secondary | ICD-10-CM

## 2018-04-27 DIAGNOSIS — E119 Type 2 diabetes mellitus without complications: Secondary | ICD-10-CM | POA: Insufficient documentation

## 2018-04-27 DIAGNOSIS — Z7984 Long term (current) use of oral hypoglycemic drugs: Secondary | ICD-10-CM | POA: Insufficient documentation

## 2018-04-27 DIAGNOSIS — Z510 Encounter for antineoplastic radiation therapy: Secondary | ICD-10-CM | POA: Insufficient documentation

## 2018-04-27 DIAGNOSIS — C3491 Malignant neoplasm of unspecified part of right bronchus or lung: Secondary | ICD-10-CM

## 2018-04-27 DIAGNOSIS — Z720 Tobacco use: Secondary | ICD-10-CM | POA: Insufficient documentation

## 2018-04-27 DIAGNOSIS — F17210 Nicotine dependence, cigarettes, uncomplicated: Secondary | ICD-10-CM | POA: Diagnosis not present

## 2018-04-27 NOTE — Assessment & Plan Note (Addendum)
1.  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  A CT scan of the chest PE protocol on 02/04/2018 did not show pulmonary embolism.  However it showed 2.0 x 1.0 x 0.9 cm right upper lobe lung nodule. -A PET CT scan on 03/08/2018 shows 2.4 x 0.8 cm right upper lobe nodule with SUV of 2.9.  No adenopathy or metastatic disease was seen. -CT-guided biopsy on 03/19/2018 shows squamous cell carcinoma. -He was evaluated by Dr. Servando Snare and was felt to be high risk surgical candidate. -We discussed the results of the MRI of the brain dated 04/01/2018 which did not show any evidence of metastatic disease. - He was evaluated by radiation oncology today and will start radiation next week.  He will have 3 sessions of SBRT.   -I have advised him to quit smoking.  He has been smoking 2 to 3 packs/day for 65 years. - He will follow-up with [place] in 3 to 4 months with repeat CT scan of the chest.

## 2018-04-27 NOTE — Patient Instructions (Addendum)
[place] at [facility] Discharge Instructions   Follow up in 3 months with labs and scans.  Thank you for choosing [place] at [facility] to provide your oncology and hematology care.  To afford each patient quality time with our provider, please arrive at least 15 minutes before your scheduled appointment time.   If you have a lab appointment with the [facility] please come in thru the  Main Entrance and check in at the main information desk  You need to re-schedule your appointment should you arrive 10 or more minutes late.  We strive to give you quality time with our providers, and arriving late affects you and other patients whose appointments are after yours.  Also, if you no show three or more times for appointments you may be dismissed from the clinic at the providers discretion.     Again, thank you for choosing [facility].  Our hope is that these requests will decrease the amount of time that you wait before being seen by our physicians.       _____________________________________________________________  Should you have questions after your visit to [facility], please contact our office at [phone] between the hours of 8:00 a.m. and 4:30 p.m.  Voicemails left after 4:00 p.m. will not be returned until the following business day.  For prescription refill requests, have your pharmacy contact our office and allow 72 hours.    Cancer Center Support Programs:   > Cancer Support Group  2nd Tuesday of the month 1pm-2pm, Journey Room

## 2018-04-27 NOTE — Progress Notes (Signed)
[address], [place] [address]   CLINIC:  Medical Oncology/Hematology  PCP:  Sinda Du, [address] [place] [address] [phone]   REASON FOR VISIT: Follow-up for squamous cell carcinoma of the right lung  CURRENT THERAPY: work up  BRIEF ONCOLOGIC HISTORY:    Squamous cell carcinoma of right lung ([place])   03/29/2018 Initial Diagnosis    Squamous cell carcinoma of right lung ([place])    03/29/2018 Cancer Staging    Staging form: Lung, AJCC 8th Edition - Clinical stage from 03/29/2018: Stage IA3 (cT1c, cN0, cM0) - Signed by Derek Jack, MD on 03/29/2018      CANCER STAGING: Cancer Staging Squamous cell carcinoma of right lung ([place]) Staging form: Lung, AJCC 8th Edition - Clinical stage from 03/29/2018: Stage IA3 (cT1c, cN0, cM0) - Signed by Derek Jack, MD on 03/29/2018    INTERVAL HISTORY:  Mr. Postlewaite 77 y.o. male returns for routine follow-up for squamous cell lung cancer. Patient is here today with his wife. He is doing well. He met with the surgeon and they will not perform surgery they referred him for radiation. He will have 3-5 treatments. He has fatigue throughout the day. He denies any nausea, vomiting, or diarrhea. Denies any new pains. Denies any hemoptysis or new cough. He reports his appetite at 100% and his energy level at 50%.     REVIEW OF SYSTEMS:  Review of Systems  Neurological: Positive for extremity weakness.  Hematological: Bruises/bleeds easily.  All other systems reviewed and are negative.    PAST MEDICAL/SURGICAL HISTORY:  Past Medical History:  Diagnosis Date  . AAA (abdominal aortic aneurysm) ([place]) 01/2008   3.2cm  . Asthma   . BPH (benign prostatic hyperplasia)   . Chronic knee pain    bilateral  . COPD (chronic obstructive pulmonary disease) ([place])   . Diabetes mellitus    type II  . Gout   . History of kidney stones   . Hypercholesterolemia   .  Hyperlipidemia   . Hypertension   . Normal nuclear stress test 2011  . Obesity   . Obstructive sleep apnea on CPAP   . Presence of indwelling urinary catheter   . Shortness of breath    due to cigarette abuse  . Tobacco abuse    Past Surgical History:  Procedure Laterality Date  . ABDOMINAL AORTIC ENDOVASCULAR FENESTRATED STENT GRAFT N/A 04/09/2016   Procedure: ABDOMINAL AORTIC ENDOVASCULAR FENESTRATED STENT GRAFT with left brachial artery access using ultrasound guidance;  Surgeon: Serafina Mitchell, MD;  Location: [facility];  Service: Vascular;  Laterality: N/A;  . [place]   [place]  . PERIPHERAL VASCULAR CATHETERIZATION N/A 04/10/2016   Procedure: Renal Angiography;  Surgeon: Angelia Mould, MD;  Location: [facility];  Service: Cardiovascular;  Laterality: N/A;  . TRANSURETHRAL RESECTION OF PROSTATE  01/07/2011   Procedure: TRANSURETHRAL RESECTION OF THE PROSTATE (TURP);  Surgeon: Marissa Nestle;  Location: [facility];  Service: Urology;  Laterality: N/A;     SOCIAL HISTORY:  Social History   Socioeconomic History  . Marital status: Married    Spouse name: Not on file  . Number of children: Not on file  . Years of education: Not on file  . Highest education level: Not on file  Occupational History  . Not on file  Social Needs  . Financial resource strain: Not on file  . Food insecurity:    Worry:  Not on file    Inability: Not on file  . Transportation needs:    Medical: No    Non-medical: No  Tobacco Use  . Smoking status: Current Every Day Smoker    Packs/day: 3.00    Years: 50.00    Pack years: 150.00    Types: Cigarettes  . Smokeless tobacco: Never Used  . Tobacco comment: 2ppd 10/30/2016 ee  Substance and Sexual Activity  . Alcohol use: Yes    Alcohol/week: 0.0 standard drinks    Comment: very little  . Drug use: No  . Sexual activity: Not on file  Lifestyle  . Physical activity:    Days per week: Not on file    Minutes per session:  Not on file  . Stress: Not on file  Relationships  . Social connections:    Talks on phone: Not on file    Gets together: Not on file    Attends religious service: Not on file    Active member of club or organization: Not on file    Attends meetings of clubs or organizations: Not on file    Relationship status: Not on file  . Intimate partner violence:    Fear of current or ex partner: Not on file    Emotionally abused: Not on file    Physically abused: Not on file    Forced sexual activity: Not on file  Other Topics Concern  . Not on file  Social History Narrative  . Not on file    FAMILY HISTORY:  Family History  Problem Relation Age of Onset  . Liver disease Father   . Lymphoma Mother   . Diabetes Mother   . Hypertension Mother   . Cancer Mother   . Mental retardation Unknown        sibling -- from birth   . Asthma Daughter   . Allergic rhinitis Neg Hx   . Angioedema Neg Hx   . Atopy Neg Hx   . Eczema Neg Hx   . Immunodeficiency Neg Hx     CURRENT MEDICATIONS:  Outpatient Encounter Medications as of 04/27/2018  Medication Sig Note  . albuterol (PROVENTIL HFA;VENTOLIN HFA) 108 (90 BASE) MCG/ACT inhaler Inhale 2 puffs into the lungs every 6 (six) hours as needed (rescue inhaler).    [name] allopurinol (ZYLOPRIM) 300 MG tablet Take 300 mg by mouth daily.     [name] amLODipine (NORVASC) 10 MG tablet TAKE 1 TABLET EVERY DAY   . atorvastatin (LIPITOR) 20 MG tablet Take 20 mg by mouth daily.    [name] azelastine (ASTELIN) 0.1 % nasal spray Place 1 spray into both nostrils 2 (two) times daily. Use in each nostril as directed   . clopidogrel (PLAVIX) 75 MG tablet TAKE (1) TABLET BY MOUTH ONCE DAILY. 03/15/2018: On hold  . Fluticasone-Umeclidin-Vilant (TRELEGY ELLIPTA) 100-62.5-25 MCG/INH AEPB Inhale into the lungs.   . metFORMIN (GLUCOPHAGE) 500 MG tablet Take 500 mg by mouth 2 (two) times daily with a meal.    . metoprolol succinate (TOPROL-XL) 25 MG 24 hr tablet TAKE 1 TABLET EVERY DAY     . sertraline (ZOLOFT) 50 MG tablet Take 50 mg by mouth daily.     [name] torsemide (DEMADEX) 20 MG tablet Take 40 mg by mouth daily.    . hydrALAZINE (APRESOLINE) 50 MG tablet Take 1 tablet (50 mg total) by mouth 2 (two) times daily.   [name] losartan (COZAAR) 100 MG tablet TAKE 1 TABLET (100 MG TOTAL) BY MOUTH DAILY.  Facility-Administered Encounter Medications as of 04/27/2018  Medication  . ipratropium (ATROVENT) nebulizer solution 0.5 mg  . levalbuterol (XOPENEX) nebulizer solution 1.25 mg  . omalizumab Arvid Right) injection 375 mg    ALLERGIES:  Allergies  Allergen Reactions  . No Known Allergies      PHYSICAL EXAM:  ECOG Performance status: 1  Vitals:   04/27/18 1449  BP: 140/61  Pulse: 72  Resp: 20  Temp: 98.2 F (36.8 C)  SpO2: 97%   Filed Weights   04/27/18 1449  Weight: 235 lb (106.6 kg)    Physical Exam  Constitutional: He is oriented to person, place, and time. He appears well-developed and well-nourished.  Musculoskeletal: Normal range of motion.  Neurological: He is alert and oriented to person, place, and time.  Skin: Skin is warm and dry.  Psychiatric: He has a normal mood and affect. His behavior is normal. Judgment and thought content normal.     LABORATORY DATA:  I have reviewed the labs as listed.  CBC    Component Value Date/Time   WBC 12.3 (H) 03/19/2018 0929   RBC 5.07 03/19/2018 0929   HGB 16.6 03/19/2018 0929   HCT 48.9 03/19/2018 0929   PLT 160 03/19/2018 0929   MCV 96.4 03/19/2018 0929   MCH 32.7 03/19/2018 0929   MCHC 33.9 03/19/2018 0929   RDW 14.2 03/19/2018 0929   LYMPHSABS 2.6 03/19/2018 0929   MONOABS 0.9 03/19/2018 0929   EOSABS 0.1 03/19/2018 0929   BASOSABS 0.1 03/19/2018 0929   CMP Latest Ref Rng & Units 03/19/2018 02/01/2018 10/19/2017  Glucose 70 - 99 mg/dL 359(H) 215(H) 151(H)  BUN 8 - 23 mg/dL 27(H) 16 16  Creatinine 0.61 - 1.24 mg/dL 1.37(H) 1.03 1.02  Sodium 135 - 145 mmol/L 137 136 138  Potassium 3.5 - 5.1 mmol/L 4.1  4.2 4.3  Chloride 98 - 111 mmol/L 100 101 101  CO2 22 - 32 mmol/L '27 25 25  ' Calcium 8.9 - 10.3 mg/dL 9.4 9.4 9.5  Total Protein 6.5 - 8.1 g/dL - - -  Total Bilirubin 0.3 - 1.2 mg/dL - - -  Alkaline Phos 38 - 126 U/L - - -  AST 15 - 41 U/L - - -  ALT 17 - 63 U/L - - -     I have personally reviewed MRI of brain images and discussed with the patient.     ASSESSMENT & PLAN:   Squamous cell carcinoma of right lung ([facility]) 1.  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  A CT scan of the chest PE protocol on 02/04/2018 did not show pulmonary embolism.  However it showed 2.0 x 1.0 x 0.9 cm right upper lobe lung nodule. -A PET CT scan on 03/08/2018 shows 2.4 x 0.8 cm right upper lobe nodule with SUV of 2.9.  No adenopathy or metastatic disease was seen. -CT-guided biopsy on 03/19/2018 shows squamous cell carcinoma. -He was evaluated by Dr. Servando Snare and was felt to be high risk surgical candidate. -We discussed the results of the MRI of the brain dated 04/01/2018 which did not show any evidence of metastatic disease. - He was evaluated by radiation oncology today and will start radiation next week.  He will have 3 sessions of SBRT.   -I have advised him to quit smoking.  He has been smoking 2 to 3 packs/day for 65 years. -  He will follow-up with [place] in 3 to 4 months with repeat CT scan of the chest.      Orders placed this encounter:  Orders Placed This Encounter  Procedures  . CT Chest W Contrast      Derek Jack, MD [place] [phone]

## 2018-04-27 NOTE — Progress Notes (Signed)
Radiation Oncology         (336) [phone] ________________________________  Name: Kevin Frank        MRN: [id number]  Date of Service: 04/27/2018 DOB: 02-26-1941  TM:LYYTKPT, Percell Miller, MD  Grace Isaac, MD     REFERRING PHYSICIAN: Grace Isaac, MD   DIAGNOSIS: The primary encounter diagnosis was Squamous cell carcinoma of right lung ([place]). 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   HISTORY OF PRESENT ILLNESS: Kevin Frank is a 77 y.o. male seen at the request of Dr. Servando Snare for a new diagnosis of a squamous cell carcinoma of the lung.  The patient had been experiencing shortness of breath exerted activities, and has noticed a change in his stamina for work, seen and evaluated and this led to a T angiogram on 02/04/2018, which revealed an irregular nodular opacity in the posterior segment of the right upper lobe measuring 2.1 x 0.9 cm no additional nodules or adenopathy was identified.  No embolism was identified as well.  He underwent PET imaging on 03/08/2018 which revealed a lesion in the apical segment of the right upper lobe with an SUV of 2.9.  No additional hypermetabolism is present.  He underwent CT-guided needle biopsy on 03/19/2018 which revealed a squamous cell carcinoma, and a brain MRI scan on 04/01/2018 was negative for disease.  He has been evaluated by Dr. Servando Snare, and is not a great surgical candidate given his comorbidities.  He comes today to discuss options of stereotactic body radiotherapy (SBRT).    PREVIOUS RADIATION THERAPY: No   PAST MEDICAL HISTORY:  Past Medical History:  Diagnosis Date  . AAA (abdominal aortic aneurysm) ([place]) 01/2008   3.2cm  . Asthma   . BPH (benign prostatic hyperplasia)   . Chronic knee pain    bilateral  . COPD (chronic obstructive pulmonary disease) ([place])   . Diabetes mellitus    type II  . Gout   . History of kidney stones   . Hypercholesterolemia    . Hyperlipidemia   . Hypertension   . Normal nuclear stress test 2011  . Obesity   . Obstructive sleep apnea on CPAP   . Presence of indwelling urinary catheter   . Shortness of breath    due to cigarette abuse  . Tobacco abuse        PAST SURGICAL HISTORY: Past Surgical History:  Procedure Laterality Date  . ABDOMINAL AORTIC ENDOVASCULAR FENESTRATED STENT GRAFT N/A 04/09/2016   Procedure: ABDOMINAL AORTIC ENDOVASCULAR FENESTRATED STENT GRAFT with left brachial artery access using ultrasound guidance;  Surgeon: Serafina Mitchell, MD;  Location: [facility];  Service: Vascular;  Laterality: N/A;  . [place]   [place]  . PERIPHERAL VASCULAR CATHETERIZATION N/A 04/10/2016   Procedure: Renal Angiography;  Surgeon: Angelia Mould, MD;  Location: [facility];  Service: Cardiovascular;  Laterality: N/A;  . TRANSURETHRAL RESECTION OF PROSTATE  01/07/2011   Procedure: TRANSURETHRAL RESECTION OF THE PROSTATE (TURP);  Surgeon: Marissa Nestle;  Location: [facility];  Service: Urology;  Laterality: N/A;     FAMILY HISTORY:  Family History  Problem Relation Age of Onset  . Liver disease Father   . Lymphoma Mother   . Diabetes Mother   . Hypertension Mother   . Cancer Mother   . Mental retardation Unknown        sibling -- from birth   . Asthma Daughter   .  Allergic rhinitis Neg Hx   . Angioedema Neg Hx   . Atopy Neg Hx   . Eczema Neg Hx   . Immunodeficiency Neg Hx      SOCIAL HISTORY:  reports that he has been smoking cigarettes. He has a 150.00 pack-year smoking history. He has never used smokeless tobacco. He reports that he drinks alcohol. He reports that he does not use drugs. The patient admits to 3PPD cigarette smoking. He lives in [place], [place]. He's accompanied by his wife and daughter. 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    ALLERGIES: No known allergies   MEDICATIONS:  Current Outpatient Medications  Medication Sig Dispense Refill  . albuterol (PROVENTIL  HFA;VENTOLIN HFA) 108 (90 BASE) MCG/ACT inhaler Inhale 2 puffs into the lungs every 6 (six) hours as needed (rescue inhaler).     [name] allopurinol (ZYLOPRIM) 300 MG tablet Take 300 mg by mouth daily.      [name] amLODipine (NORVASC) 10 MG tablet TAKE 1 TABLET EVERY DAY 90 tablet 3  . atorvastatin (LIPITOR) 20 MG tablet Take 20 mg by mouth daily.     [name] azelastine (ASTELIN) 0.1 % nasal spray Place 1 spray into both nostrils 2 (two) times daily. Use in each nostril as directed    . clopidogrel (PLAVIX) 75 MG tablet TAKE (1) TABLET BY MOUTH ONCE DAILY. 30 tablet 0  . Fluticasone-Umeclidin-Vilant (TRELEGY ELLIPTA) 100-62.5-25 MCG/INH AEPB Inhale into the lungs.    . metFORMIN (GLUCOPHAGE) 500 MG tablet Take 500 mg by mouth 2 (two) times daily with a meal.     . metoprolol succinate (TOPROL-XL) 25 MG 24 hr tablet TAKE 1 TABLET EVERY DAY 90 tablet 3  . sertraline (ZOLOFT) 50 MG tablet Take 50 mg by mouth daily.      [name] torsemide (DEMADEX) 20 MG tablet Take 40 mg by mouth daily.     . hydrALAZINE (APRESOLINE) 50 MG tablet Take 1 tablet (50 mg total) by mouth 2 (two) times daily. 60 tablet 6  . losartan (COZAAR) 100 MG tablet TAKE 1 TABLET (100 MG TOTAL) BY MOUTH DAILY. 90 tablet 2   Current Facility-Administered Medications  Medication Dose Route Frequency Provider Last Rate Last Dose  . ipratropium (ATROVENT) nebulizer solution 0.5 mg  0.5 mg Nebulization Once Gean Quint, MD      . levalbuterol Penne Lash) nebulizer solution 1.25 mg  1.25 mg Nebulization Once Gean Quint, MD      . omalizumab Arvid Right) injection 375 mg  375 mg Subcutaneous Q14 Days Valentina Shaggy, MD   375 mg at 04/21/18 [id number]     REVIEW OF SYSTEMS: On review of systems, the patient reports that he is doing well overall. He does have a chronic cough. He reports fatigue, and poor sleep. He denies any chest pain, shortness of breath, fevers, chills, night sweats, unintended weight changes. He denies any bowel or bladder  disturbances, and denies abdominal pain, nausea or vomiting. He denies any new musculoskeletal or joint aches or pains. A complete review of systems is obtained and is otherwise negative.     PHYSICAL EXAM:  Wt Readings from Last 3 Encounters:  04/27/18 236 lb 6 oz (107.2 kg)  04/19/18 235 lb (106.6 kg)  04/08/18 235 lb (106.6 kg)   Temp Readings from Last 3 Encounters:  04/27/18 97.9 F (36.6 C) (Oral)  03/29/18 97.6 F (36.4 C) (Oral)  03/19/18 98 F (36.7 C) (Oral)   BP Readings from Last 3 Encounters:  04/27/18 (!) 159/78  04/19/18 121/87  04/08/18 136/68   Pulse Readings from Last 3 Encounters:  04/27/18 65  04/19/18 80  04/08/18 82   Pain Assessment Pain Score: 0-No pain/10  In general this is a well appearing Caucasian male in no acute distress. He is alert and oriented x4 and appropriate throughout the examination. HEENT reveals that the patient is normocephalic, atraumatic. EOMs are intact.  Skin is intact without any evidence of gross lesions. Cardiovascular exam reveals a regular rate and rhythm, no clicks rubs or murmurs are auscultated. Chest is clear to auscultation bilaterally. Lymphatic assessment is performed and does not reveal any adenopathy in the cervical, supraclavicular, axillary, or inguinal chains. Abdomen has active bowel sounds in all quadrants and is intact. The abdomen is soft, non tender, non distended. Lower extremities are negative for pretibial pitting edema, deep calf tenderness, cyanosis or clubbing.   ECOG = 0  0 - Asymptomatic (Fully active, able to carry on all predisease activities without restriction)  1 - Symptomatic but completely ambulatory (Restricted in physically strenuous activity but ambulatory and able to carry out work of a light or sedentary nature. For example, light housework, office work)  2 - Symptomatic, <50% in bed during the day (Ambulatory and capable of all self care but unable to carry out any work activities. Up and  about more than 50% of waking hours)  3 - Symptomatic, >50% in bed, but not bedbound (Capable of only limited self-care, confined to bed or chair 50% or more of waking hours)  4 - Bedbound (Completely disabled. Cannot carry on any self-care. Totally confined to bed or chair)  5 - Death   Eustace Pen MM, Creech RH, Tormey DC, et al. [phone]). "Toxicity and response criteria of the [facility]". [place] Oncol. 5 (6): 649-55    LABORATORY DATA:  Lab Results  Component Value Date   WBC 12.3 (H) 03/19/2018   HGB 16.6 03/19/2018   HCT 48.9 03/19/2018   MCV 96.4 03/19/2018   PLT 160 03/19/2018   Lab Results  Component Value Date   NA 137 03/19/2018   K 4.1 03/19/2018   CL 100 03/19/2018   CO2 27 03/19/2018   Lab Results  Component Value Date   ALT 23 04/02/2016   AST 18 04/02/2016   ALKPHOS 87 04/02/2016   BILITOT 0.6 04/02/2016      RADIOGRAPHY: Mr Jeri Cos BO Contrast  Result Date: 04/01/2018 CLINICAL DATA:  Lung cancer. EXAM: MRI HEAD WITHOUT AND WITH CONTRAST TECHNIQUE: Multiplanar, multiecho pulse sequences of the brain and surrounding structures were obtained without and with intravenous contrast. CONTRAST:  10 mL Gadavist COMPARISON:  MRI brain 04/11/2016 FINDINGS: Brain: Atrophy and white matter changes are similar the prior study. Remote encephalomalacia of the right frontal lobe is again seen. This appears to be posttraumatic with involvement both laterally and inferiorly. Diffuse white matter changes extend into the brainstem. Cerebellum is unremarkable. The internal auditory canals are within normal limits bilaterally. The postcontrast images demonstrate no pathologic enhancement. Vascular: Flow is present in the major intracranial arteries. Skull and upper cervical spine: The skull base is within normal limits. The craniocervical junction is normal. Marrow signal is normal. Sinuses/Orbits: Fluid levels are present in the maxillary sinuses bilaterally.  The remaining paranasal sinuses and the mastoid air cells are clear. Bilateral lens replacements are present. Globes and orbits are within normal limits. IMPRESSION: 1. No evidence for metastatic disease to the brain or meninges. 2. Stable atrophy and white  matter disease. This likely reflects the sequela of chronic microvascular ischemia. 3. 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 Electronically Signed   By: San Morelle M.D.   On: 04/01/2018 15:08       IMPRESSION/PLAN: 1. 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 Dr. Lisbeth Renshaw discusses the pathology findings and reviews the nature of early stage lung cancers and the options of treatment. While surgery is not prohibitive, he is at high risk of postoperative complications given his cardiopulmonary comorbidities. We reviewed how SBRT can be an alternative to surgical resection, though resection is preferred. We discussed the risks, benefits, short, and long term effects of radiotherapy, and the patient is interested in proceeding. Dr. Lisbeth Renshaw discusses the delivery and logistics of radiotherapy and anticipates a course of 3-5 fractions. Written consent is obtained and placed in the chart, a copy was provided to the patient. He will be contacted by our department to coordinate simulation.    The above documentation reflects my direct findings during this shared patient visit. Please see the separate note by Dr. Lisbeth Renshaw on this date for the remainder of the patient's plan of care.    Carola Rhine, PAC

## 2018-04-30 DIAGNOSIS — C3491 Malignant neoplasm of unspecified part of right bronchus or lung: Secondary | ICD-10-CM | POA: Diagnosis not present

## 2018-04-30 DIAGNOSIS — C3411 Malignant neoplasm of upper lobe, right bronchus or lung: Secondary | ICD-10-CM | POA: Diagnosis not present

## 2018-04-30 DIAGNOSIS — Z510 Encounter for antineoplastic radiation therapy: Secondary | ICD-10-CM | POA: Diagnosis not present

## 2018-05-05 ENCOUNTER — Ambulatory Visit
Admission: RE | Admit: 2018-05-05 | Discharge: 2018-05-05 | Disposition: A | Discharge status: Home / Self Care | Payer: Medicare HMO | Source: Ambulatory Visit | Attending: Radiation Oncology | Admitting: Radiation Oncology

## 2018-05-05 ENCOUNTER — Ambulatory Visit: Payer: Medicare HMO

## 2018-05-05 DIAGNOSIS — C3491 Malignant neoplasm of unspecified part of right bronchus or lung: Secondary | ICD-10-CM | POA: Diagnosis not present

## 2018-05-05 DIAGNOSIS — Z510 Encounter for antineoplastic radiation therapy: Secondary | ICD-10-CM | POA: Diagnosis not present

## 2018-05-07 ENCOUNTER — Ambulatory Visit
Admission: RE | Admit: 2018-05-07 | Discharge: 2018-05-07 | Disposition: A | Discharge status: Home / Self Care | Payer: Medicare HMO | Source: Ambulatory Visit | Attending: Radiation Oncology | Admitting: Radiation Oncology

## 2018-05-07 DIAGNOSIS — Z510 Encounter for antineoplastic radiation therapy: Secondary | ICD-10-CM | POA: Diagnosis not present

## 2018-05-07 DIAGNOSIS — C3491 Malignant neoplasm of unspecified part of right bronchus or lung: Secondary | ICD-10-CM | POA: Diagnosis not present

## 2018-05-07 DIAGNOSIS — Z961 Presence of intraocular lens: Secondary | ICD-10-CM | POA: Diagnosis not present

## 2018-05-07 DIAGNOSIS — E119 Type 2 diabetes mellitus without complications: Secondary | ICD-10-CM | POA: Diagnosis not present

## 2018-05-12 ENCOUNTER — Ambulatory Visit
Admission: RE | Admit: 2018-05-12 | Discharge: 2018-05-12 | Disposition: A | Discharge status: Home / Self Care | Payer: Medicare HMO | Source: Ambulatory Visit | Attending: Radiation Oncology | Admitting: Radiation Oncology

## 2018-05-12 DIAGNOSIS — C3491 Malignant neoplasm of unspecified part of right bronchus or lung: Secondary | ICD-10-CM | POA: Diagnosis not present

## 2018-05-12 DIAGNOSIS — C3411 Malignant neoplasm of upper lobe, right bronchus or lung: Secondary | ICD-10-CM | POA: Diagnosis not present

## 2018-05-12 DIAGNOSIS — Z510 Encounter for antineoplastic radiation therapy: Secondary | ICD-10-CM | POA: Diagnosis not present

## 2018-05-12 NOTE — Progress Notes (Signed)
[facility] Radiation Oncology Simulation and Treatment Planning Note   Name:  RODGERICK GILLIAND MRN: [id number]   Date: 05/12/2018  DOB: 12/21/1940  Status:outpatient    DIAGNOSIS:    ICD-10-CM   1. Squamous cell carcinoma of right lung (HCC) C34.91      CONSENT VERIFIED:yes   SET UP: Patient is setup supine   IMMOBILIZATION: The patient was immobilized using a customized Vac Loc bag/ blue bag and customized accuform device   NARRATIVE:The patient was brought to the [facility].  Identity was confirmed.  All relevant records and images related to the planned course of therapy were reviewed.  Then, the patient was positioned in a stable reproducible clinical set-up for radiation therapy. Abdominal compression was applied by me.  4D CT images were obtained and reproducible breathing pattern was confirmed. Free breathing CT images were obtained.  Skin markings were placed.  The CT images were loaded into the planning software where the target and avoidance structures were contoured.  The radiation prescription was entered and confirmed.    TREATMENT PLANNING NOTE:  Treatment planning then occurred. I have requested : IMRT planning.This treatment technique is medically necessary due to the high-dose of radiation delivered to the target region which is in close proximity to adjacent critical normal structures.  3 dimensional simulation is performed and dose volume histogram of the gross tumor volume, planning tumor volume and criticial normal structures including the spinal cord and lungs were analyzed and requested.  Special treatment procedure was performed due to high dose per fraction.  The patient will be monitored for increased risk of toxicity.  Daily imaging using cone beam CT will be used for target localization.  I anticipate that the patient will receive 54 Gy in 3 fractions to target volume. Further adjustments will be made based on the  planning process is necessary.  ------------------------------------------------  Jodelle Gross, MD, PhD

## 2018-05-14 ENCOUNTER — Ambulatory Visit: Payer: Medicare HMO

## 2018-05-19 ENCOUNTER — Ambulatory Visit (INDEPENDENT_AMBULATORY_CARE_PROVIDER_SITE_OTHER): Payer: Medicare HMO

## 2018-05-19 DIAGNOSIS — J4540 Moderate persistent asthma, uncomplicated: Secondary | ICD-10-CM

## 2018-05-28 ENCOUNTER — Encounter: Payer: Self-pay | Admitting: Radiation Oncology

## 2018-05-28 NOTE — Progress Notes (Signed)
  Radiation Oncology         [phone]) [phone] ________________________________  Name: INDY KUCK MRN: [id number]  Date: 05/28/2018  DOB: 1941-05-26  End of Treatment Note  Diagnosis:   Non-small cell lung cancer     Indication for treatment:  Curative       Radiation treatment dates:   05/05/18, 05/07/18, 05/12/18  Site/dose:   The tumor in the right upper lung was treated with a course of stereotactic body radiation treatment. The patient received 54 Gy In 3 fractions at 18 G per fraction.  Narrative: The patient tolerated radiation treatment relatively well.   The patient did not have any signs of acute toxicity during treatment.  Plan: The patient has completed radiation treatment. The patient will return to radiation oncology clinic for routine followup in one month. I advised the patient to call or return sooner if they have any questions or concerns related to their recovery or treatment.   ------------------------------------------------  Jodelle Gross, MD, PhD   This document serves as a record of services personally performed by Kyung Rudd, MD. It was created on his behalf by Wilburn Mylar, a trained medical scribe. The creation of this record is based on the scribe's personal observations and the provider's statements to them. This document has been checked and approved by the attending provider.

## 2018-05-31 ENCOUNTER — Encounter: Payer: Self-pay | Admitting: Nurse Practitioner

## 2018-05-31 ENCOUNTER — Ambulatory Visit (INDEPENDENT_AMBULATORY_CARE_PROVIDER_SITE_OTHER): Payer: Medicare HMO | Admitting: Nurse Practitioner

## 2018-05-31 VITALS — BP 110/58 | HR 89 | Ht 70.0 in | Wt 235.4 lb

## 2018-05-31 DIAGNOSIS — I10 Essential (primary) hypertension: Secondary | ICD-10-CM | POA: Diagnosis not present

## 2018-05-31 DIAGNOSIS — E7800 Pure hypercholesterolemia, unspecified: Secondary | ICD-10-CM | POA: Diagnosis not present

## 2018-05-31 DIAGNOSIS — F17200 Nicotine dependence, unspecified, uncomplicated: Secondary | ICD-10-CM

## 2018-05-31 DIAGNOSIS — F17210 Nicotine dependence, cigarettes, uncomplicated: Secondary | ICD-10-CM

## 2018-05-31 DIAGNOSIS — I5032 Chronic diastolic (congestive) heart failure: Secondary | ICD-10-CM

## 2018-05-31 NOTE — Patient Instructions (Addendum)
We will be checking the following labs today - NONE    Medication Instructions:    Continue with your current medicines.    If you need a refill on your cardiac medications before your next appointment, please call your pharmacy.     Testing/Procedures To Be Arranged:  N/A  Follow-Up:   See me in 6 months    At [facility], you and your health needs are our priority.  As part of our continuing mission to provide you with exceptional heart care, we have created designated Provider Care Teams.  These Care Teams include your primary Cardiologist (physician) and Advanced Practice Providers (APPs -  Physician Assistants and Nurse Practitioners) who all work together to provide you with the care you need, when you need it.  Special Instructions:  . None  Call the [facility] office at [phone] if you have any questions, problems or concerns.

## 2018-05-31 NOTE — Progress Notes (Signed)
CARDIOLOGY OFFICE NOTE  Date:  05/31/2018    Kevin Frank Date of Birth: Mar 23, 1941 Medical Record #[id number]  PCP:  Sinda Du, MD  Cardiologist:  Jerel Shepherd    Chief Complaint  Patient presents with  . Follow-up    History of Present Illness: Kevin Frank is a 77 y.o. male who presents today for a follow up visit. Seen for Dr. Burt Knack. He is a former patient of Dr. Susa Simmonds.   He has a history of hypertension, hyperlipidemia, DM, tobacco abuse, COPD, and abdominal aortic aneurysm.On 04/09/2016 he underwent FEVARwith a single rightrenal fenestration by Dr. Trula Slade. He also had left renal angioplasty at the time of the main body deployment service to ensure that the graft not cover the renal artery because of angulation. The day following his procedure, his creatinine increased and ultrasound did not show good perfusion to the left kidney and therefore he went to the Cath Lab and had stenting of his left renal artery. His creatinine trended down and his urine output improved. The patient also had issues with left leg numbness in his legs after the procedure. Neurology was consulted. It was felt this was most likely secondary to lower back disease that was aggravated during surgery.  He continues to smoke cigarettes. He has not had any known CADsymptoms but has had prior CT scan showing left main and 3VD. He has been on chronic Plavix. He has had lung nodules noted as well.  I have seen him over the past several years - he is not interested in stopping smoking. Meds have been adjusted for his BP.He has had some stress with legal issues over the last few times seen.  He called here in June with shortness of breath - I offered to see him sooner but he declined.I then saw him in August - he had had more swelling - did not know his medicines - lots of salt - still smoking - more shortness of breath. I increased his hydralazine. We got his echo  updated - this showed worsening pulmonary HTN - referred for CT - probable lung mass - referred on for PET which was abnormal and now found to have lung cancer. He has been managed with radiation. Saw EBG who did not feel surgery was in his best interest.    Comes in today. Here alone. He has finished his XRT/SRS. Still smoking - still has his cigarettes in his pocket. Maybe a little less but "not enough to brag about". No chest pain. Breathing is "ok". Back to his normal activities - only issue is sleep - he can sleep "at the drop of a hat". BP is ok. Feels ok on his medicines.   Past Medical History:  Diagnosis Date  . AAA (abdominal aortic aneurysm) ([place]) 01/2008   3.2cm  . Asthma   . BPH (benign prostatic hyperplasia)   . Chronic knee pain    bilateral  . COPD (chronic obstructive pulmonary disease) ([place])   . Diabetes mellitus    type II  . Gout   . History of kidney stones   . Hypercholesterolemia   . Hyperlipidemia   . Hypertension   . Normal nuclear stress test 2011  . Obesity   . Obstructive sleep apnea on CPAP   . Presence of indwelling urinary catheter   . Shortness of breath    due to cigarette abuse  . Tobacco abuse     Past Surgical History:  Procedure Laterality Date  .  ABDOMINAL AORTIC ENDOVASCULAR FENESTRATED STENT GRAFT N/A 04/09/2016   Procedure: ABDOMINAL AORTIC ENDOVASCULAR FENESTRATED STENT GRAFT with left brachial artery access using ultrasound guidance;  Surgeon: Serafina Mitchell, MD;  Location: [facility];  Service: Vascular;  Laterality: N/A;  . [place]  . PERIPHERAL VASCULAR CATHETERIZATION N/A 04/10/2016   Procedure: Renal Angiography;  Surgeon: Angelia Mould, MD;  Location: [facility];  Service: Cardiovascular;  Laterality: N/A;  . TRANSURETHRAL RESECTION OF PROSTATE  01/07/2011   Procedure: TRANSURETHRAL RESECTION OF THE PROSTATE (TURP);  Surgeon: Marissa Nestle;  Location: [facility];  Service: Urology;  Laterality: N/A;      Medications: Current Meds  Medication Sig  . albuterol (PROVENTIL HFA;VENTOLIN HFA) 108 (90 BASE) MCG/ACT inhaler Inhale 2 puffs into the lungs every 6 (six) hours as needed (rescue inhaler).   [name] allopurinol (ZYLOPRIM) 300 MG tablet Take 300 mg by mouth daily.    [name] amLODipine (NORVASC) 10 MG tablet TAKE 1 TABLET EVERY DAY  . atorvastatin (LIPITOR) 20 MG tablet Take 20 mg by mouth daily.   [name] azelastine (ASTELIN) 0.1 % nasal spray Place 1 spray into both nostrils 2 (two) times daily. Use in each nostril as directed  . clopidogrel (PLAVIX) 75 MG tablet TAKE (1) TABLET BY MOUTH ONCE DAILY.  [name] Fluticasone-Umeclidin-Vilant (TRELEGY ELLIPTA) 100-62.5-25 MCG/INH AEPB Inhale into the lungs.  . metFORMIN (GLUCOPHAGE) 500 MG tablet Take 500 mg by mouth 2 (two) times daily with a meal.   . metoprolol succinate (TOPROL-XL) 25 MG 24 hr tablet TAKE 1 TABLET EVERY DAY  . sertraline (ZOLOFT) 50 MG tablet Take 50 mg by mouth daily.    [name] torsemide (DEMADEX) 20 MG tablet Take 40 mg by mouth daily.    Current Facility-Administered Medications for the 05/31/18 encounter (Office Visit) with Burtis Junes, NP  Medication  . ipratropium (ATROVENT) nebulizer solution 0.5 mg  . levalbuterol (XOPENEX) nebulizer solution 1.25 mg  . omalizumab Arvid Right) injection 375 mg     Allergies: Allergies  Allergen Reactions  . No Known Allergies     Social History: The patient  reports that he has been smoking cigarettes. He has a 150.00 pack-year smoking history. He has never used smokeless tobacco. He reports that he drinks alcohol. He reports that he does not use drugs.   Family History: The patient's family history includes Asthma in his daughter; Cancer in his mother; Diabetes in his mother; Hypertension in his mother; Liver disease in his father; Lymphoma in his mother; Mental retardation in his unknown relative.   Review of Systems: Please see the history of present illness.   Otherwise, the review of  systems is positive for none.   All other systems are reviewed and negative.   Physical Exam: VS:  BP (!) 110/58   Pulse 89   Ht 5\' 10"  (1.778 m)   Wt 235 lb 6.4 oz (106.8 kg)   SpO2 94%   BMI 33.78 kg/m  .  BMI Body mass index is 33.78 kg/m.  Wt Readings from Last 3 Encounters:  05/31/18 235 lb 6.4 oz (106.8 kg)  04/27/18 235 lb (106.6 kg)  04/27/18 236 lb 6 oz (107.2 kg)    General: Pleasant. Chronically ill appearing but alert and in no acute distress.   HEENT: Normal.  Neck: Supple, no JVD, carotid bruits, or masses noted.  Cardiac: Regular rate and rhythm. Heart tones are distant. No edema.  Respiratory:  Lungs are coarse.  GI:  Soft and nontender.  MS: No deformity or atrophy. Gait and ROM intact.  Skin: Warm and dry. Color is ruddy Neuro:  Strength and sensation are intact and no gross focal deficits noted.  Psych: Alert, appropriate and with normal affect.   LABORATORY DATA:  EKG:  EKG is not ordered today.  Lab Results  Component Value Date   WBC 12.3 (H) 03/19/2018   HGB 16.6 03/19/2018   HCT 48.9 03/19/2018   PLT 160 03/19/2018   GLUCOSE 359 (H) 03/19/2018   CHOL 138 12/16/2010   TRIG 53.0 12/16/2010   HDL 46.10 12/16/2010   LDLCALC 81 12/16/2010   ALT 23 04/02/2016   AST 18 04/02/2016   NA 137 03/19/2018   K 4.1 03/19/2018   CL 100 03/19/2018   CREATININE 1.37 (H) 03/19/2018   BUN 27 (H) 03/19/2018   CO2 27 03/19/2018   INR 0.96 03/19/2018   HGBA1C 6.0 (H) 04/02/2016     BNP (last 3 results) No results for input(s): BNP in the last 8760 hours.  ProBNP (last 3 results) Recent Labs    01/26/18 0919  PROBNP 161     Other Studies Reviewed Today:   Assessment/Plan:  Echo Study Conclusions 01/2018  - Left ventricle: The cavity size was normal. There was mild concentric hypertrophy. Systolic function was normal. The estimated ejection fraction was in the range of 55% to 60%. Wall motion was normal; there were no regional wall  motion abnormalities. Features are consistent with a pseudonormal left ventricular filling pattern, with concomitant abnormal relaxation and increased filling pressure (grade 2 diastolic dysfunction). - Aortic valve: Sclerosis without stenosis. There was no regurgitation. - Mitral valve: There was trivial regurgitation. - Left atrium: The atrium was mildly dilated. - Right ventricle: The cavity size was moderately dilated. Wall thickness was normal. Systolic function was normal. RV systolic pressure (S, est): 53 mm Hg. - Atrial septum: No defect or patent foramen ovale was identified. - Tricuspid valve: There was trivial regurgitation. - Pulmonic valve: There was no significant regurgitation. - Pulmonary arteries: Systolic pressure was moderately to severely increased. PA peak pressure: 53 mm Hg (S).  Impressions:  - Normal LV systolic function with grade II diastolic dysfunction. Elevated right sided filling pressures, consistent with pulmonary hypertension.   Assessment/Plan:  PET IMPRESSION 02/2018: 1. The 2.4 by 0.8 cm apical segment right upper lobe nodule has persisted over the last month and has a maximum SUV of 2.9, highly suspicious for malignancy. No adenopathy or metastatic spread is identified. 2. Other imaging findings of potential clinical significance: Aortic Atherosclerosis (ICD10-I70.0) and Emphysema (ICD10-J43.9). Coronary atherosclerosis. Airway thickening is present, suggesting bronchitis or reactive airways disease. Chronic bilateral maxillary sinusitis. Mild prostatomegaly. Aorta bi-iliac stent graft. Sigmoid colon diverticulosis.   Electronically Signed By: Van Clines M.D. On: 03/09/2018 08:51     CTA CHEST IMPRESSION 01/2018: 1. Somewhat irregular nodular opacity in the posterior segment of the right upper lobe measuring 2.0 x 1.0 x 0.9 cm. Small neoplasm must be of concern. Advise correlation with nuclear  medicine PET study to further assess.  2. No demonstrable pulmonary embolus. No thoracic aortic aneurysm or dissection. There are foci of aortic atherosclerosis as well as coronary artery and great vessel calcification.  3. Underlying centrilobular emphysematous change. Atelectasis evident in both lower lobes and posterior segment left upper lobe. No frank consolidation noted.  4. No evident thoracic adenopathy.  5. Adrenal hypertrophy bilaterally, more notable on the left than on the right.  6. Absent  gallbladder.  These results will be called to the ordering clinician or representative by the Radiologist Assistant, and communication documented in the PACS or zVision Dashboard.  Aortic Atherosclerosis (ICD10-I70.0) and Emphysema (ICD10-J43.9).   Electronically Signed By: Lowella Grip III M.D. On: 02/04/2018 11:12    Myoview Study Highlights from 02/2016   Nuclear stress EF: 58%.  There was no ST segment deviation noted during stress.  The study is normal.  This is a low risk study.    Echo Study Conclusions from 02/2016  - Left ventricle: The cavity size was normal. Wall thickness was increased in a pattern of mild LVH. Systolic function was normal. The estimated ejection fraction was in the range of 60% to 65%. Wall motion was normal; there were no regional wall motion abnormalities. Features are consistent with a pseudonormal left ventricular filling pattern, with concomitant abnormal relaxation and increased filling pressure (grade 2 diastolic dysfunction). Doppler parameters are consistent with high ventricular filling pressure. - Mitral valve: There was mild regurgitation. - Left atrium: The atrium was moderately dilated. - Pulmonary arteries: Systolic pressure was mildly increased. PA peak pressure: 37 mm Hg (S).  Impressions:  - Normal LV systolic function; grade 2 diastolic dysfunction with elevated  LV filling pressure; mild MR; moderate LAE; trace TR with mildly elevated pulmonary pressure.  CT ANGIO CHEST/ABD/PELVIS IMPRESSION: 1. Patent suprarenal bifurcated aortic stent graft without endoleak, native sac diameter 5.3 cm. 2. Coronary and Aortic Atherosclerosis (ICD10-170.0) without dissection. 3. Descending and sigmoid diverticulosis.   Electronically Signed By: Lucrezia Europe M.D. On: 05/16/2016 11:02    Assessment/Plan:  1. HTN - BP continues to look good - no changes made.   2. Worsening SOB - now with lung cancer/squamous cell carcinoma - he has finished radiation. Felt to be too high risk for surgical intervention.   3. HLD - on statin - his labs are checked by PCP  4. Long standing tobacco - still smoking - unfortunately, I don't think he will ever stop.   5. Prior AAA/FEVAR repair with stenting of the left renal artery - he follows with [facility].   6. Chronic diastolic HF- looks stable.   7. Presumed CAD - no active symptoms - has been seen on prior scans - he has been maintained on Plavix.Would favor conservative management.   Current medicines are reviewed with the patient today.  The patient does not have concerns regarding medicines other than what has been noted above.  The following changes have been made:  See above.  Labs/ tests ordered today include:   No orders of the defined types were placed in this encounter.    Disposition:   FU with me in 6 months.   Patient is agreeable to this plan and will call if any problems develop in the interim.   SignedTruitt Merle, NP  05/31/2018 11:11 AM  [facility] [address] [place],   [address] Phone: [phone] Fax: [phone]

## 2018-06-02 ENCOUNTER — Ambulatory Visit (INDEPENDENT_AMBULATORY_CARE_PROVIDER_SITE_OTHER): Payer: Medicare HMO | Admitting: *Deleted

## 2018-06-02 DIAGNOSIS — J4540 Moderate persistent asthma, uncomplicated: Secondary | ICD-10-CM | POA: Diagnosis not present

## 2018-06-09 ENCOUNTER — Ambulatory Visit: Payer: Medicare HMO | Admitting: Allergy & Immunology

## 2018-06-09 ENCOUNTER — Encounter: Payer: Self-pay | Admitting: Allergy & Immunology

## 2018-06-09 VITALS — BP 126/70 | HR 70 | Resp 17

## 2018-06-09 DIAGNOSIS — J449 Chronic obstructive pulmonary disease, unspecified: Secondary | ICD-10-CM

## 2018-06-09 DIAGNOSIS — F17200 Nicotine dependence, unspecified, uncomplicated: Secondary | ICD-10-CM | POA: Diagnosis not present

## 2018-06-09 DIAGNOSIS — J3089 Other allergic rhinitis: Secondary | ICD-10-CM | POA: Diagnosis not present

## 2018-06-09 DIAGNOSIS — J302 Other seasonal allergic rhinitis: Secondary | ICD-10-CM

## 2018-06-09 NOTE — Patient Instructions (Addendum)
1. Asthma-COPD overlap syndrome - Lung function looks stable.  - I think the Xolair is doing a good job with managing your medications.  - Daily controller medication(s): Trelegy 100/62.5/25 one puff once daily + Xolair every two weeks - Prior to physical activity: ProAir 2 puffs 10-15 minutes before physical activity. - Rescue medications: ProAir 4 puffs every 4-6 hours as needed or albuterol nebulizer one vial every 4-6 hours as needed - Asthma control goals:  * Full participation in all desired activities (may need albuterol before activity) * Albuterol use two time or less a week on average (not counting use with activity) * Cough interfering with sleep two time or less a month * Oral steroids no more than once a year * No hospitalizations - Good luck with the biopsy this week (I am cautiously optimistic).   2. Chronic rhinitis (cat, dog, cockroach, trees, weeds, and ragweed) - Continue with nasal saline rinses 1-2 times daily to try to heal your nose and prevent more bleeds. - Use an antihistamine as needed (Allegra, Zyrtec, Claritin).   3. Return in about 4 months (around 10/09/2018).   Please inform us of any Emergency Department visits, hospitalizations, or changes in symptoms. Call us before going to the ED for breathing or allergy symptoms since we might be able to fit you in for a sick visit. Feel free to contact us anytime with any questions, problems, or concerns.  It was a pleasure to see you again today!   Websites that have reliable patient information: 1. American Academy of Asthma, Allergy, and Immunology: www.aaaai.org 2. Food Allergy Research and Education (FARE): foodallergy.org 3. Mothers of Asthmatics: http://www.asthmacommunitynetwork.org 4. American College of Allergy, Asthma, and Immunology: www.acaai.org

## 2018-06-09 NOTE — Progress Notes (Signed)
FOLLOW UP  Date of Service/Encounter:  06/09/18   Assessment:   Asthma-COPD overlap syndrome-with improved lung function onXolair therapy  Worsening shortness of breath - with improved lung function  Perennial and seasonal allergicrhinitis(cat, dog, cockroach, trees, weeds, and ragweed)  Current smoker (three packs per day)  Complex medical history, including a recently diagnosed lung cancer (detected early, treated with radiation alone)  Plan/Recommendations:   1. Asthma-COPD overlap syndrome - Lung function looks stable.  - I think the Xolair is doing a good job with managing your medications.  - Daily controller medication(s): Trelegy 100/62.5/25 one puff once daily + Xolair every two weeks - Prior to physical activity: ProAir 2 puffs 10-15 minutes before physical activity. - Rescue medications: ProAir 4 puffs every 4-6 hours as needed or albuterol nebulizer one vial every 4-6 hours as needed - Asthma control goals:  * Full participation in all desired activities (may need albuterol before activity) * Albuterol use two time or less a week on average (not counting use with activity) * Cough interfering with sleep two time or less a month * Oral steroids no more than once a year * No hospitalizations - Good luck with the biopsy this week (I am cautiously optimistic).   2. Chronic rhinitis (cat, dog, cockroach, trees, weeds, and ragweed) - Continue with nasal saline rinses 1-2 times daily to try to heal your nose and prevent more bleeds. - Use an antihistamine as needed (Allegra, Zyrtec, Claritin).   3. Return in about 4 months (around 10/09/2018).  Subjective:   Kevin Frank is a 77 y.o. male presenting today for follow up of  Chief Complaint  Patient presents with  . Asthma    CONNERY SHIFFLER has a history of the following: Patient Active Problem List   Diagnosis Date Noted  . Squamous cell carcinoma of right lung ([place]) 03/29/2018  . Seasonal and  perennial allergic rhinitis 09/22/2017  . COPD with asthma ([place]) 09/22/2017  . Obstructive sleep apnea on CPAP 04/23/2017  . Chronic knee pain 04/23/2017  . AKI (acute kidney injury) ([place]) 04/23/2017  . Chronic diastolic CHF (congestive heart failure) ([place]) 04/23/2017  . Cellulitis 04/23/2017  . Cellulitis of left lower leg   . Smoking greater than 40 pack years 03/05/2016  . Incidental lung nodule, > 51mm and < 71mm 03/05/2016  . Stage 2 moderate COPD by GOLD classification ([place]) 03/05/2016  . AAA (abdominal aortic aneurysm) without rupture ([place]) 06/14/2015  . OBSTRUCTIVE SLEEP APNEA 06/09/2007  . HYPERCHOLESTEROLEMIA 05/06/2007  . GOUT 05/06/2007  . DEPRESSION 05/06/2007  . Hypertension, essential 05/06/2007  . ABDOMINAL AORTIC ANEURYSM 05/06/2007  . BENIGN PROSTATIC HYPERTROPHY, HX OF 05/06/2007    History obtained from: chart review and patient.  Zenaida Niece Primary Care Provider is Sinda Du, MD.     Justun is a 77 y.o. male presenting for a follow up visit.  He was last seen in September 2019.  At that time, his lung function looked better than it had before.  We continued Trelegy 1 puff once daily plus Xolair every 2 weeks.  For his chronic rhinitis, we continue nasal saline rinses as well as oral antihistamines.  Since the last visit, he is doing very well.  In the interim, he did have a biopsy that confirmed carcinoma of the lung.  However, he was treated with radiation alone and did well.  He is can have a checkup in January.  His oncologist is Dr. Delton Coombes.  He is very  thankful that no surgery was needed and that the cancer was caught so early.  However, he continues to smoke 2 packs/day, which is down from where he used to smoke.  Asthma/Respiratory Symptom History: He remains on the Trelegy 1 puff once daily.  He is also on Xolair.  He does feel like the addition of the Xolair has helped.  He is sleeping a little bit more than he used to, but occasionally he  will wake up even earlier than he used to.  This morning for instance, he woke up at 1:30 AM and has been going strong since that time.  He will occasionally sleep till 4 AM, at which time he does feel very well rested.  He does have a history of obstructive sleep apnea and is on a CPAP machine.  He is not sure the last time that these settings were changed.  This was all prescribed by Dr. Luan Pulling.  Allergic Rhinitis Symptom History: He does have a history of multiple sensitizations.  We will continue with the nasal saline rinses 1-2 times daily and antihistamines as needed.  He has not needed antibiotics since last visit.  Otherwise, there have been no changes to his past medical history, surgical history, family history, or social history.    Review of Systems: a 14-point review of systems is pertinent for what is mentioned in HPI.  Otherwise, all other systems were negative.  Constitutional: negative other than that listed in the HPI Eyes: negative other than that listed in the HPI Ears, nose, mouth, throat, and face: negative other than that listed in the HPI Respiratory: negative other than that listed in the HPI Cardiovascular: negative other than that listed in the HPI Gastrointestinal: negative other than that listed in the HPI Genitourinary: negative other than that listed in the HPI Integument: negative other than that listed in the HPI Hematologic: negative other than that listed in the HPI Musculoskeletal: negative other than that listed in the HPI Neurological: negative other than that listed in the HPI Allergy/Immunologic: negative other than that listed in the HPI    Objective:   Blood pressure 126/70, pulse 70, resp. rate 17, SpO2 96 %. There is no height or weight on file to calculate BMI.   Physical Exam:   General: Alert, interactive, in no acute distress. Boisterous.  Eyes: No conjunctival injection bilaterally, no discharge on the right, no discharge on the left  and no Horner-Trantas dots present. PERRL bilaterally. EOMI without pain. No photophobia.  Ears: Right TM pearly gray with normal light reflex, Left TM pearly gray with normal light reflex, Right TM intact without perforation and Left TM intact without perforation.  Nose/Throat: External nose within normal limits and septum midline. Turbinates edematous and pale with clear discharge. Posterior oropharynx erythematous without cobblestoning in the posterior oropharynx. Tonsils 2+ without exudates.  Tongue without thrush. Lungs: Decreased breath sounds with expiratory wheezing bilaterally. Increased work of breathing. CV: Normal S1/S2. No murmurs. Capillary refill <2 seconds.  Skin: Warm and dry, without lesions or rashes. Neuro:   Grossly intact. No focal deficits appreciated. Responsive to questions.  Diagnostic studies:   Spirometry: results abnormal (FEV1: 1.72/66%, FVC: 2.50/62%, FEV1/FVC: 68%).    Spirometry consistent with mixed obstructive and restrictive disease.  Allergy Studies: none   Salvatore Marvel, MD  [facility]

## 2018-06-14 DIAGNOSIS — J449 Chronic obstructive pulmonary disease, unspecified: Secondary | ICD-10-CM | POA: Diagnosis not present

## 2018-06-14 DIAGNOSIS — E1165 Type 2 diabetes mellitus with hyperglycemia: Secondary | ICD-10-CM | POA: Diagnosis not present

## 2018-06-14 DIAGNOSIS — G4733 Obstructive sleep apnea (adult) (pediatric): Secondary | ICD-10-CM | POA: Diagnosis not present

## 2018-06-14 DIAGNOSIS — C3490 Malignant neoplasm of unspecified part of unspecified bronchus or lung: Secondary | ICD-10-CM | POA: Diagnosis not present

## 2018-06-22 DIAGNOSIS — J449 Chronic obstructive pulmonary disease, unspecified: Secondary | ICD-10-CM | POA: Diagnosis not present

## 2018-06-22 DIAGNOSIS — E1165 Type 2 diabetes mellitus with hyperglycemia: Secondary | ICD-10-CM | POA: Diagnosis not present

## 2018-06-22 DIAGNOSIS — G4733 Obstructive sleep apnea (adult) (pediatric): Secondary | ICD-10-CM | POA: Diagnosis not present

## 2018-06-22 DIAGNOSIS — C3491 Malignant neoplasm of unspecified part of right bronchus or lung: Secondary | ICD-10-CM | POA: Diagnosis not present

## 2018-06-25 ENCOUNTER — Ambulatory Visit (INDEPENDENT_AMBULATORY_CARE_PROVIDER_SITE_OTHER): Payer: Medicare HMO

## 2018-06-25 DIAGNOSIS — J4540 Moderate persistent asthma, uncomplicated: Secondary | ICD-10-CM | POA: Diagnosis not present

## 2018-06-26 ENCOUNTER — Other Ambulatory Visit: Payer: Self-pay | Admitting: Allergy & Immunology

## 2018-06-28 ENCOUNTER — Other Ambulatory Visit: Payer: Self-pay | Admitting: *Deleted

## 2018-06-30 DIAGNOSIS — D485 Neoplasm of uncertain behavior of skin: Secondary | ICD-10-CM | POA: Diagnosis not present

## 2018-06-30 DIAGNOSIS — L570 Actinic keratosis: Secondary | ICD-10-CM | POA: Diagnosis not present

## 2018-07-05 ENCOUNTER — Other Ambulatory Visit: Payer: Self-pay

## 2018-07-05 ENCOUNTER — Encounter: Payer: Self-pay | Admitting: Radiation Oncology

## 2018-07-05 ENCOUNTER — Ambulatory Visit
Admission: RE | Admit: 2018-07-05 | Discharge: 2018-07-05 | Disposition: A | Discharge status: Home / Self Care | Payer: Medicare HMO | Source: Ambulatory Visit | Attending: Radiation Oncology | Admitting: Radiation Oncology

## 2018-07-05 VITALS — BP 137/70 | HR 67 | Temp 97.8°F | Resp 20 | Wt 228.4 lb

## 2018-07-05 DIAGNOSIS — Z7984 Long term (current) use of oral hypoglycemic drugs: Secondary | ICD-10-CM | POA: Insufficient documentation

## 2018-07-05 DIAGNOSIS — C3411 Malignant neoplasm of upper lobe, right bronchus or lung: Secondary | ICD-10-CM | POA: Insufficient documentation

## 2018-07-05 DIAGNOSIS — Z79899 Other long term (current) drug therapy: Secondary | ICD-10-CM | POA: Diagnosis not present

## 2018-07-05 DIAGNOSIS — C3491 Malignant neoplasm of unspecified part of right bronchus or lung: Secondary | ICD-10-CM

## 2018-07-05 DIAGNOSIS — Z7902 Long term (current) use of antithrombotics/antiplatelets: Secondary | ICD-10-CM | POA: Insufficient documentation

## 2018-07-05 DIAGNOSIS — F17210 Nicotine dependence, cigarettes, uncomplicated: Secondary | ICD-10-CM | POA: Insufficient documentation

## 2018-07-06 NOTE — Progress Notes (Signed)
Radiation Oncology         [phone] ________________________________  Name: Kevin Frank MRN: [id number]  Date of Service: 07/05/2018 DOB: 1940-10-15  Post Treatment Note  CC: Sinda Du, MD  Grace Isaac, MD  Diagnosis:   Stage IA3, cT1cN0M0, NSCLC, squamous cell carcinoma of the right upper lobe  Interval Since Last Radiation:  8 weeks   05/05/18-05/12/18 SBRT:  The tumor in the right upper lung was treated with a course of stereotactic body radiation treatment. The patient received 54 Gy In 3 fractions at 18 G per fraction.  Narrative:  The patient returns today for routine follow-up.  He received his radiotherapy and completed this in Novemember. He tolerated radiotherapy without difficulty. He continues to smoke and reports he does have a chronic cough that is stable. He denies any fevers or shortness of breath. No other complaints are noted.  ALLERGIES:  is allergic to no known allergies.  Meds: Current Outpatient Medications  Medication Sig Dispense Refill  . albuterol (PROVENTIL HFA;VENTOLIN HFA) 108 (90 BASE) MCG/ACT inhaler Inhale 2 puffs into the lungs every 6 (six) hours as needed (rescue inhaler).     [name] allopurinol (ZYLOPRIM) 300 MG tablet Take 300 mg by mouth daily.      [name] amLODipine (NORVASC) 10 MG tablet TAKE 1 TABLET EVERY DAY 90 tablet 3  . atorvastatin (LIPITOR) 20 MG tablet Take 20 mg by mouth daily.     [name] azelastine (ASTELIN) 0.1 % nasal spray Place 1 spray into both nostrils 2 (two) times daily. Use in each nostril as directed    . clopidogrel (PLAVIX) 75 MG tablet TAKE (1) TABLET BY MOUTH ONCE DAILY. 30 tablet 0  . metFORMIN (GLUCOPHAGE) 500 MG tablet Take 500 mg by mouth 2 (two) times daily with a meal.     . metoprolol succinate (TOPROL-XL) 25 MG 24 hr tablet TAKE 1 TABLET EVERY DAY 90 tablet 3  . sertraline (ZOLOFT) 50 MG tablet Take 50 mg by mouth daily.      [name] torsemide (DEMADEX) 20 MG tablet Take 40 mg by mouth daily.     . TRELEGY  ELLIPTA 100-62.5-25 MCG/INH AEPB INHALE 1 PUFF INTO THE LUNGS DAILY--RINSE, GARGLE AND SPIT AFTER USE. 60 each 4  . hydrALAZINE (APRESOLINE) 50 MG tablet Take 1 tablet (50 mg total) by mouth 2 (two) times daily. 60 tablet 6  . losartan (COZAAR) 100 MG tablet TAKE 1 TABLET (100 MG TOTAL) BY MOUTH DAILY. 90 tablet 2   Current Facility-Administered Medications  Medication Dose Route Frequency Provider Last Rate Last Dose  . ipratropium (ATROVENT) nebulizer solution 0.5 mg  0.5 mg Nebulization Once Gean Quint, MD      . levalbuterol Penne Lash) nebulizer solution 1.25 mg  1.25 mg Nebulization Once Gean Quint, MD      . omalizumab Arvid Right) injection 375 mg  375 mg Subcutaneous Q14 Days Valentina Shaggy, MD   375 mg at 06/25/18 [id number]    Physical Findings:  weight is 228 lb 6.4 oz (103.6 kg). His oral temperature is 97.8 F (36.6 C). His blood pressure is 137/70 and his pulse is 67. His respiration is 20 and oxygen saturation is 95%.  Pain Assessment Pain Score: 0-No pain/10 In general this is a well appearing caucasian male in no acute distress. He's alert and oriented x4 and appropriate throughout the examination. Cardiopulmonary assessment is negative for acute distress and he exhibits normal effort with RRR, no C/R/M. Chest is CTAB.  Lab Findings: Lab Results  Component Value Date   WBC 12.3 (H) 03/19/2018   HGB 16.6 03/19/2018   HCT 48.9 03/19/2018   MCV 96.4 03/19/2018   PLT 160 03/19/2018     Radiographic Findings: No results found.  Impression/Plan: 1. 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 Mr. [name] is doing very well following radiotherapy. He will follow up with Dr. Delton Coombes for surveillance and is due for repeat scan at the beginning of February and to see Dr. Delton Coombes to review the results. The patient is in agreement with this plan and I've let him know we'd be happy to see him back if he has any questions or concerns  regarding his prior treatment. He was given precautions to call as well.    Carola Rhine, PAC

## 2018-07-09 ENCOUNTER — Ambulatory Visit (INDEPENDENT_AMBULATORY_CARE_PROVIDER_SITE_OTHER): Payer: Medicare HMO

## 2018-07-09 DIAGNOSIS — J4540 Moderate persistent asthma, uncomplicated: Secondary | ICD-10-CM | POA: Diagnosis not present

## 2018-07-13 ENCOUNTER — Telehealth: Payer: Self-pay | Admitting: Radiation Oncology

## 2018-07-13 NOTE — Telephone Encounter (Signed)
The patient called and LM for me about pain in his sides along the ribs. I returned his call. He is having more shortness of breath and coughing more frequently with more mucous. He does not have any known fevers. We discussed that it would be unusual for shingles in this presentation as he notes bilateral symptoms, as well as rib fracture this recently from radiation. He could though have pneumonia developing or radiation pneumonitis. I think it would be a good idea to proceed with his CT scan of the chest sooner than when it's scheduled in 2 weeks. I'll ask our schedulers to move this up and I'll notify Dr. Delton Coombes. If he has features of pneumonitis, we would treat with abx and steroids. He is in agreement.

## 2018-07-14 ENCOUNTER — Telehealth: Payer: Self-pay | Admitting: *Deleted

## 2018-07-14 NOTE — Telephone Encounter (Signed)
CALLED PATIENT TO INFORM OF LAB AND CT SCAN FOR 07-16-18 - ARRIVAL TIME - 5:15 PM FOR LABS AND CT FOR 5:30 PM - PT. TO BE NPO- 4 HRS. PRIOR TO TEST, SPOKE WITH PATIENT'S WIFE- CAROL AND SHE IS AWARE OF THESE APPTS., LABS AND CT TO BE @ [facility]

## 2018-07-16 ENCOUNTER — Encounter (HOSPITAL_COMMUNITY): Payer: Self-pay | Admitting: *Deleted

## 2018-07-16 ENCOUNTER — Telehealth: Payer: Self-pay | Admitting: *Deleted

## 2018-07-16 ENCOUNTER — Ambulatory Visit (HOSPITAL_COMMUNITY): Payer: Medicare HMO

## 2018-07-16 NOTE — Progress Notes (Signed)
We received a call from central scheduling, they were calling the patient to reschedule his CT scan and patient is complaining of chest pain.  I called patient to triage and he states that he has been having soreness and pain under his arms and in ribs it started on left side and is now on the right side as well.  He states that it only hurts when he pulls his arms into his side or if he takes a deep breath.  He denies any nausea/vomiting/shortness of breath/fever.  He states that he is fine.  I advised him should his pain increase or he begins to experience in numbness in one arm or sweating or nausea to call 911 immediately.  I told him if his symptoms didn't improve by Monday to call his PCP for evaluation.  He verbalizes understanding and just says "alright".

## 2018-07-16 NOTE — Telephone Encounter (Signed)
RETURNED PATIENT'S PHONE CALL, NO ANSWER, UNABLE TO LEAVE MESSAGE

## 2018-07-20 ENCOUNTER — Ambulatory Visit (HOSPITAL_COMMUNITY)
Admission: RE | Admit: 2018-07-20 | Discharge: 2018-07-20 | Disposition: A | Discharge status: Home / Self Care | Payer: Medicare HMO | Source: Ambulatory Visit | Attending: Nurse Practitioner | Admitting: Nurse Practitioner

## 2018-07-20 DIAGNOSIS — E1165 Type 2 diabetes mellitus with hyperglycemia: Secondary | ICD-10-CM | POA: Diagnosis not present

## 2018-07-20 DIAGNOSIS — C3490 Malignant neoplasm of unspecified part of unspecified bronchus or lung: Secondary | ICD-10-CM | POA: Diagnosis not present

## 2018-07-20 DIAGNOSIS — C3491 Malignant neoplasm of unspecified part of right bronchus or lung: Secondary | ICD-10-CM | POA: Diagnosis not present

## 2018-07-20 DIAGNOSIS — C3411 Malignant neoplasm of upper lobe, right bronchus or lung: Secondary | ICD-10-CM | POA: Diagnosis not present

## 2018-07-20 DIAGNOSIS — I10 Essential (primary) hypertension: Secondary | ICD-10-CM | POA: Diagnosis not present

## 2018-07-20 DIAGNOSIS — J441 Chronic obstructive pulmonary disease with (acute) exacerbation: Secondary | ICD-10-CM | POA: Diagnosis not present

## 2018-07-20 LAB — POCT I-STAT CREATININE: Creatinine, Ser: 1.1 mg/dL (ref 0.61–1.24)

## 2018-07-20 MED ORDER — IOHEXOL 300 MG/ML  SOLN
75.0000 mL | Freq: Once | INTRAMUSCULAR | Status: AC | PRN
  Administered 2018-07-20: 75 mL via INTRAVENOUS

## 2018-07-22 ENCOUNTER — Telehealth: Payer: Self-pay | Admitting: Radiation Oncology

## 2018-07-23 ENCOUNTER — Telehealth: Payer: Self-pay

## 2018-07-23 ENCOUNTER — Ambulatory Visit (INDEPENDENT_AMBULATORY_CARE_PROVIDER_SITE_OTHER): Payer: Medicare HMO

## 2018-07-23 DIAGNOSIS — J4540 Moderate persistent asthma, uncomplicated: Secondary | ICD-10-CM | POA: Diagnosis not present

## 2018-07-23 NOTE — Telephone Encounter (Signed)
Patients wife called stating that the patient wasn't feeling well once he got home and went to bed as soon as he got home. Wife states his chest was hurting some and was wondering if there was something the patient could keep at home to check his breathing. I spoke with Dr Ernst Bowler and he stated that they can buy a pulse ox and also to offer him an appointment this afternoon. She states she will try to wake him and see if he wants to come in the afternoon. I told her to give [place] a call after 1:30 due to the phones being rolled.

## 2018-07-23 NOTE — Telephone Encounter (Signed)
Wife called back and stated that patient didn't want to come in and that he was fine. While patient was here getting his injection he was dozing off in waiting room and when I asked if he was tired he stated yes. Wife stated that she would try and get a pulse ox and call if he was having any problems.

## 2018-07-23 NOTE — Telephone Encounter (Signed)
Pt called and LM for me asking for Ct results. I called him back and had to leave a message asking him to return our call. If he is symptomatic with productive cough and fever, it may be beneficial to consider treating with antibiotics given tree in bud findings in the lungs.

## 2018-07-25 NOTE — Telephone Encounter (Signed)
Can we call him on Monday to see how he is doing?   Salvatore Marvel, MD Allergy [facility] [place]

## 2018-07-26 ENCOUNTER — Telehealth: Payer: Self-pay | Admitting: *Deleted

## 2018-07-26 ENCOUNTER — Other Ambulatory Visit (HOSPITAL_COMMUNITY): Payer: Self-pay | Admitting: *Deleted

## 2018-07-26 DIAGNOSIS — C3491 Malignant neoplasm of unspecified part of right bronchus or lung: Secondary | ICD-10-CM

## 2018-07-26 NOTE — Telephone Encounter (Signed)
RETURNED PATIENT'S CALL, LVM

## 2018-07-26 NOTE — Telephone Encounter (Signed)
Patient states he is doing okay, complains of soreness around his waist and in his joints..... Patient states he is a little sob but states it's nothing unusual for him. Patient states no complaints of chest pain, tightness or any other sx. Advise patient if any sx arise then he needs to contact the office.

## 2018-07-27 ENCOUNTER — Inpatient Hospital Stay (HOSPITAL_COMMUNITY): Payer: Medicare HMO | Attending: Hematology

## 2018-07-27 ENCOUNTER — Ambulatory Visit (HOSPITAL_COMMUNITY): Payer: Medicare HMO

## 2018-07-27 DIAGNOSIS — C3491 Malignant neoplasm of unspecified part of right bronchus or lung: Secondary | ICD-10-CM

## 2018-07-27 DIAGNOSIS — C3411 Malignant neoplasm of upper lobe, right bronchus or lung: Secondary | ICD-10-CM | POA: Insufficient documentation

## 2018-07-27 DIAGNOSIS — Z720 Tobacco use: Secondary | ICD-10-CM | POA: Diagnosis not present

## 2018-07-27 DIAGNOSIS — R05 Cough: Secondary | ICD-10-CM | POA: Insufficient documentation

## 2018-07-27 LAB — COMPREHENSIVE METABOLIC PANEL
ALT: 28 U/L (ref 0–44)
ANION GAP: 11 (ref 5–15)
AST: 21 U/L (ref 15–41)
Albumin: 3.5 g/dL (ref 3.5–5.0)
Alkaline Phosphatase: 68 U/L (ref 38–126)
BILIRUBIN TOTAL: 0.6 mg/dL (ref 0.3–1.2)
BUN: 14 mg/dL (ref 8–23)
CHLORIDE: 99 mmol/L (ref 98–111)
CO2: 23 mmol/L (ref 22–32)
Calcium: 8.9 mg/dL (ref 8.9–10.3)
Creatinine, Ser: 0.99 mg/dL (ref 0.61–1.24)
GFR calc Af Amer: >60 mL/min (ref >60)
Glucose, Bld: 157 mg/dL — ABNORMAL HIGH (ref 70–99)
POTASSIUM: 3.3 mmol/L — AB (ref 3.5–5.1)
Sodium: 133 mmol/L — ABNORMAL LOW (ref 135–145)
TOTAL PROTEIN: 7.2 g/dL (ref 6.5–8.1)

## 2018-07-27 LAB — CBC WITH DIFFERENTIAL/PLATELET
Abs Immature Granulocytes: 0.17 10*3/uL — ABNORMAL HIGH (ref 0.00–0.07)
BASOS ABS: 0.1 10*3/uL (ref 0.0–0.1)
BASOS PCT: 1 %
EOS PCT: 4 %
Eosinophils Absolute: 0.4 10*3/uL (ref 0.0–0.5)
HEMATOCRIT: 45.9 % (ref 39.0–52.0)
Hemoglobin: 15.1 g/dL (ref 13.0–17.0)
IMMATURE GRANULOCYTES: 2 %
LYMPHS ABS: 1.6 10*3/uL (ref 0.7–4.0)
Lymphocytes Relative: 16 %
MCH: 31.2 pg (ref 26.0–34.0)
MCHC: 32.9 g/dL (ref 30.0–36.0)
MCV: 94.8 fL (ref 80.0–100.0)
MONOS PCT: 10 %
Monocytes Absolute: 1 10*3/uL (ref 0.1–1.0)
NEUTROS PCT: 67 %
NRBC: 0 % (ref 0.0–0.2)
Neutro Abs: 6.8 10*3/uL (ref 1.7–7.7)
PLATELETS: 222 10*3/uL (ref 150–400)
RBC: 4.84 MIL/uL (ref 4.22–5.81)
RDW: 13.8 % (ref 11.5–15.5)
WBC: 10.1 10*3/uL (ref 4.0–10.5)

## 2018-07-27 LAB — LACTATE DEHYDROGENASE: LDH: 111 U/L (ref 98–192)

## 2018-07-29 ENCOUNTER — Ambulatory Visit (HOSPITAL_COMMUNITY): Payer: Medicare HMO | Admitting: Hematology

## 2018-07-30 ENCOUNTER — Other Ambulatory Visit (HOSPITAL_COMMUNITY): Payer: Self-pay | Admitting: Nurse Practitioner

## 2018-07-30 ENCOUNTER — Encounter (HOSPITAL_COMMUNITY): Payer: Self-pay | Admitting: *Deleted

## 2018-07-30 ENCOUNTER — Ambulatory Visit: Payer: Medicare HMO | Admitting: Allergy & Immunology

## 2018-07-30 VITALS — BP 98/60 | HR 70 | Temp 98.3°F | Resp 22

## 2018-07-30 DIAGNOSIS — J189 Pneumonia, unspecified organism: Secondary | ICD-10-CM

## 2018-07-30 DIAGNOSIS — J302 Other seasonal allergic rhinitis: Secondary | ICD-10-CM

## 2018-07-30 DIAGNOSIS — J449 Chronic obstructive pulmonary disease, unspecified: Secondary | ICD-10-CM | POA: Diagnosis not present

## 2018-07-30 DIAGNOSIS — J3089 Other allergic rhinitis: Secondary | ICD-10-CM | POA: Diagnosis not present

## 2018-07-30 DIAGNOSIS — J181 Lobar pneumonia, unspecified organism: Secondary | ICD-10-CM

## 2018-07-30 DIAGNOSIS — F17200 Nicotine dependence, unspecified, uncomplicated: Secondary | ICD-10-CM | POA: Diagnosis not present

## 2018-07-30 MED ORDER — LEVOFLOXACIN 500 MG PO TABS: 500.0000 mg | ORAL_TABLET | Freq: Every day | ORAL | 0 refills | Status: DC

## 2018-07-30 MED ORDER — BUDESONIDE 0.5 MG/2ML IN SUSP: 0.5000 mg | Freq: Two times a day (BID) | RESPIRATORY_TRACT | 1 refills | Status: DC

## 2018-07-30 MED ORDER — ALBUTEROL SULFATE (2.5 MG/3ML) 0.083% IN NEBU: INHALATION_SOLUTION | RESPIRATORY_TRACT | 1 refills | Status: DC

## 2018-07-30 NOTE — Patient Instructions (Addendum)
1. Asthma-COPD overlap syndrome - You were not moving good air on the right side, but it sounded better following the nebulizer treatment.  - For the next TWO WEEKS, add on Pulmicort (nebulized steroid) and albuterol neb solution mixed twice daily. - Hopefully the inhaled steroid will help with controlling inflammation in the lungs. - Get a refill of the levofloxacin and start that course for another ten days. - Call us Monday with an update.  - Daily controller medication(s): Trelegy 100/62.5/25 one puff once daily + Xolair every two weeks - Prior to physical activity: ProAir 2 puffs 10-15 minutes before physical activity. - Rescue medications: ProAir 4 puffs every 4-6 hours as needed or albuterol nebulizer one vial every 4-6 hours as needed - Asthma control goals:  * Full participation in all desired activities (may need albuterol before activity) * Albuterol use two time or less a week on average (not counting use with activity) * Cough interfering with sleep two time or less a month * Oral steroids no more than once a year * No hospitalizations - Good luck with the biopsy this week (I am cautiously optimistic).   2. Chronic rhinitis (cat, dog, cockroach, trees, weeds, and ragweed) - Continue with nasal saline rinses 1-2 times daily to try to heal your nose and prevent more bleeds. - Use an antihistamine as needed (Allegra, Zyrtec, Claritin).   3. Return in about 3 months (around 10/28/2018).   Please inform us of any Emergency Department visits, hospitalizations, or changes in symptoms. Call us before going to the ED for breathing or allergy symptoms since we might be able to fit you in for a sick visit. Feel free to contact us anytime with any questions, problems, or concerns.  It was a pleasure to see you again today!   Websites that have reliable patient information: 1. American Academy of Asthma, Allergy, and Immunology: www.aaaai.org 2. Food Allergy Research and Education (FARE):  foodallergy.org 3. Mothers of Asthmatics: http://www.asthmacommunitynetwork.org 4. American College of Allergy, Asthma, and Immunology: www.acaai.org

## 2018-07-30 NOTE — Progress Notes (Signed)
FOLLOW UP  Date of Service/Encounter:  07/30/18   Assessment:   Asthma-COPD overlap syndrome  Seasonal and perennial allergic rhinitis  Current smoker  Pneumonia of both lower lobes   Mr. Rasmusson presents for a sick visit.  He is having worsening shortness of breath combined with increasing fatigue.  He had a chest CT that confirmed bilateral lower lobe pneumonia.  He is status post 1 course of levofloxacin, but continuing to have some symptoms.  On exam, he has decreased air movement at the bases.  He was on prednisone but this spiked his blood sugar.  However, I think he still needs some kind of anti-inflammatory.  Therefore, we will start Pulmicort mixed with albuterol nebulizer solution twice daily for 2 weeks.  I also think he would benefit from an extended course of levofloxacin, so I did ask him to refill his prescription from the pharmacy.  He will call us next week with an update.  We will continue him on Xolair and Trelegy.  We did provide a couple of samples of Trelegy.     Plan/Recommendations:   1. Asthma-COPD overlap syndrome - You were not moving good air on the right side, but it sounded better following the nebulizer treatment.  - For the next TWO WEEKS, add on Pulmicort (nebulized steroid) and albuterol neb solution mixed twice daily. - Hopefully the inhaled steroid will help with controlling inflammation in the lungs. - Get a refill of the levofloxacin and start that course for another ten days. - Call us Monday with an update.  - Daily controller medication(s): Trelegy 100/62.5/25 one puff once daily + Xolair every two weeks - Prior to physical activity: ProAir 2 puffs 10-15 minutes before physical activity. - Rescue medications: ProAir 4 puffs every 4-6 hours as needed or albuterol nebulizer one vial every 4-6 hours as needed - Asthma control goals:  * Full participation in all desired activities (may need albuterol before activity) * Albuterol use two time  or less a week on average (not counting use with activity) * Cough interfering with sleep two time or less a month * Oral steroids no more than once a year * No hospitalizations - Good luck with the biopsy this week (I am cautiously optimistic).   2. Chronic rhinitis (cat, dog, cockroach, trees, weeds, and ragweed) - Continue with nasal saline rinses 1-2 times daily to try to heal your nose and prevent more bleeds. - Use an antihistamine as needed (Allegra, Zyrtec, Claritin).   3. Return in about 3 months (around 10/28/2018).  Subjective:   NAZIER NEYHART is a 78 y.o. male presenting today for follow up of No chief complaint on file.   [name] has a history of the following: Patient Active Problem List   Diagnosis Date Noted  . Squamous cell carcinoma of right lung ([place]) 03/29/2018  . Seasonal and perennial allergic rhinitis 09/22/2017  . COPD with asthma ([place]) 09/22/2017  . Obstructive sleep apnea on CPAP 04/23/2017  . Chronic knee pain 04/23/2017  . AKI (acute kidney injury) ([place]) 04/23/2017  . Chronic diastolic CHF (congestive heart failure) ([place]) 04/23/2017  . Cellulitis 04/23/2017  . Cellulitis of left lower leg   . Smoking greater than 40 pack years 03/05/2016  . Incidental lung nodule, > 51mm and < 41mm 03/05/2016  . Stage 2 moderate COPD by GOLD classification ([place]) 03/05/2016  . AAA (abdominal aortic aneurysm) without rupture ([place]) 06/14/2015  . OBSTRUCTIVE SLEEP APNEA 06/09/2007  . HYPERCHOLESTEROLEMIA 05/06/2007  .  GOUT 05/06/2007  . DEPRESSION 05/06/2007  . Hypertension, essential 05/06/2007  . ABDOMINAL AORTIC ANEURYSM 05/06/2007  . BENIGN PROSTATIC HYPERTROPHY, HX OF 05/06/2007    History obtained from: chart review and patient and his wife.  Zenaida Niece Primary Care Provider is Sinda Du, MD.     Ascher is a 78 y.o. male presenting for a sick visit. Mr. Lakey was last seen in December 2019. At that time, he was actually doing very  well. He had completed treatment with radiation following surgical resection of his lung carcinoma. His lung function looked very good. We continued him on Trelegy one puff once daily. He also remained on Xolair every two weeks.   Last week, he did have a chest CT that showed that his cancer had shrunk.  However, he was also noted to have a multifocal airspace opacity consistent with an infection in the right greater than left lobes.  He is just was scheduled to see his oncologist yesterday, but this was rescheduled to February 25.  This morning, his oncologist did call in a course of levofloxacin.  Since the last visit, he mostly has done well, but over the last 2 weeks he has been sleeping quite a bit.  He is also having pain in his neck, right shoulder, buttocks, and bilateral legs.  He has completed a course of levofloxacin.  He did do a dose of prednisone at some point, but his blood sugars were out of control.  He does not want to do any more prednisone.  He has never had any fevers during this time.  He will get up at his normal time and have breakfast, but then go back to sleep.  He has been eating and drinking better as of last night.  He denies postnasal drip.  He has been using his nasal saline rinses as needed.  He denies sinus pressure. He denies chest pain.   Chest CT (07/20/18): IMPRESSION: 1. Similar to minimal decrease in size of right upper lobe pulmonary nodule. 2. New or significantly progressive multifocal airspace and nodular opacities, most consistent with infection or less likely aspiration. 3. Developing thoracic adenopathy, most likely reactive. Recommend attention on follow-up. 4. 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  Otherwise, there have been no changes to his past medical history, surgical history, family history, or social history.    Review of Systems: a 14-point review of systems is pertinent for what is  mentioned in HPI.  Otherwise, all other systems were negative.  Constitutional: negative other than that listed in the HPI Eyes: negative other than that listed in the HPI Ears, nose, mouth, throat, and face: negative other than that listed in the HPI Respiratory: negative other than that listed in the HPI Cardiovascular: negative other than that listed in the HPI Gastrointestinal: negative other than that listed in the HPI Genitourinary: negative other than that listed in the HPI Integument: negative other than that listed in the HPI Hematologic: negative other than that listed in the HPI Musculoskeletal: negative other than that listed in the HPI Neurological: negative other than that listed in the HPI Allergy/Immunologic: negative other than that listed in the HPI    Objective:   Blood pressure 98/60, pulse 70, temperature 98.3 F (36.8 C), temperature source Oral, resp. rate (!) 22. There is no height or weight on file to calculate BMI.   Physical Exam:  General: Alert, interactive, in mild acute distress. Certainly less vivacious than he typically is.  Eyes: No conjunctival injection bilaterally, no discharge on the right, no discharge on the left and no Horner-Trantas dots present. PERRL bilaterally. EOMI without pain. No photophobia.  Ears: Right TM pearly gray with normal light reflex, Left TM pearly gray with normal light reflex, Right TM intact without perforation and Left TM intact without perforation.  Nose/Throat: External nose within normal limits and septum midline. Turbinates edematous without discharge. Posterior oropharynx mildly erythematous without cobblestoning in the posterior oropharynx. Tonsils 2+ without exudates.  Tongue without thrush. Lungs: Clear to auscultation without wheezing, rhonchi or rales. No increased work of breathing. CV: Normal S1/S2. No murmurs. Capillary refill <2 seconds.  Skin: Warm and dry, without lesions or rashes. Neuro:   Grossly  intact. No focal deficits appreciated. Responsive to questions.  Diagnostic studies: none      Salvatore Marvel, MD  All[facility] and [facility] [place]

## 2018-07-30 NOTE — Progress Notes (Signed)
Patient's daughter called stating that patient has had decreased energy, fatigued, sleeping a lot during the day, having increased pain in neck and right shoulder.    Dr. Delton Coombes reviewed his recent CT scan which showed pneumonia.  Antibiotics called in and patient's daughter aware. Unable to reach patient by phone.   Daughter informed us that he is also going to see Dr. Ernst Bowler today for treatment as well.  I have sent him a message to update him on our conversations today.

## 2018-08-02 ENCOUNTER — Encounter: Payer: Self-pay | Admitting: Allergy & Immunology

## 2018-08-02 ENCOUNTER — Telehealth: Payer: Self-pay | Admitting: *Deleted

## 2018-08-02 NOTE — Telephone Encounter (Signed)
Called patient to check to get update on how he was doing since his visit with Dr. Ernst Bowler on 07/30/2018.  Patient states he is feeling better and he is taking the antibiotic and also the Pulmicort and Albuterol nebulizer solution sent in by Dr. Ernst Bowler.  Patient states he is not having any chest discomfort or difficulty breathing today.  Patient does have appointment for his next Xoliar injection on 08/06/18.  Patient instructed to call the office if he worsens in any way.  Patient voiced understanding.  Dr. Ernst Bowler notified.

## 2018-08-03 DIAGNOSIS — E119 Type 2 diabetes mellitus without complications: Secondary | ICD-10-CM | POA: Diagnosis not present

## 2018-08-03 DIAGNOSIS — F17200 Nicotine dependence, unspecified, uncomplicated: Secondary | ICD-10-CM | POA: Diagnosis not present

## 2018-08-03 DIAGNOSIS — J449 Chronic obstructive pulmonary disease, unspecified: Secondary | ICD-10-CM | POA: Diagnosis not present

## 2018-08-03 DIAGNOSIS — M25561 Pain in right knee: Secondary | ICD-10-CM | POA: Diagnosis not present

## 2018-08-04 ENCOUNTER — Encounter (HOSPITAL_COMMUNITY): Payer: Self-pay | Admitting: Emergency Medicine

## 2018-08-04 ENCOUNTER — Emergency Department (HOSPITAL_COMMUNITY)
Admission: EM | Admit: 2018-08-04 | Discharge: 2018-08-04 | Disposition: A | Discharge status: Home / Self Care | Payer: Medicare HMO | Attending: Emergency Medicine | Admitting: Emergency Medicine

## 2018-08-04 ENCOUNTER — Other Ambulatory Visit: Payer: Self-pay

## 2018-08-04 ENCOUNTER — Emergency Department (HOSPITAL_COMMUNITY): Payer: Medicare HMO

## 2018-08-04 DIAGNOSIS — E119 Type 2 diabetes mellitus without complications: Secondary | ICD-10-CM | POA: Insufficient documentation

## 2018-08-04 DIAGNOSIS — I10 Essential (primary) hypertension: Secondary | ICD-10-CM | POA: Diagnosis not present

## 2018-08-04 DIAGNOSIS — I110 Hypertensive heart disease with heart failure: Secondary | ICD-10-CM | POA: Insufficient documentation

## 2018-08-04 DIAGNOSIS — D0221 Carcinoma in situ of right bronchus and lung: Secondary | ICD-10-CM | POA: Diagnosis not present

## 2018-08-04 DIAGNOSIS — M25561 Pain in right knee: Secondary | ICD-10-CM | POA: Diagnosis present

## 2018-08-04 DIAGNOSIS — Z471 Aftercare following joint replacement surgery: Secondary | ICD-10-CM | POA: Diagnosis not present

## 2018-08-04 DIAGNOSIS — J449 Chronic obstructive pulmonary disease, unspecified: Secondary | ICD-10-CM | POA: Insufficient documentation

## 2018-08-04 DIAGNOSIS — Z96651 Presence of right artificial knee joint: Secondary | ICD-10-CM | POA: Diagnosis not present

## 2018-08-04 DIAGNOSIS — M25461 Effusion, right knee: Secondary | ICD-10-CM

## 2018-08-04 DIAGNOSIS — F17210 Nicotine dependence, cigarettes, uncomplicated: Secondary | ICD-10-CM | POA: Diagnosis not present

## 2018-08-04 DIAGNOSIS — Z79899 Other long term (current) drug therapy: Secondary | ICD-10-CM | POA: Diagnosis not present

## 2018-08-04 DIAGNOSIS — Z7984 Long term (current) use of oral hypoglycemic drugs: Secondary | ICD-10-CM | POA: Insufficient documentation

## 2018-08-04 DIAGNOSIS — Z7902 Long term (current) use of antithrombotics/antiplatelets: Secondary | ICD-10-CM | POA: Diagnosis not present

## 2018-08-04 DIAGNOSIS — I5032 Chronic diastolic (congestive) heart failure: Secondary | ICD-10-CM | POA: Insufficient documentation

## 2018-08-04 MED ORDER — POVIDONE-IODINE 10 % EX SOLN
CUTANEOUS | Status: AC
  Filled 2018-08-04: qty 30

## 2018-08-04 MED ORDER — LIDOCAINE HCL (PF) 1 % IJ SOLN
INTRAMUSCULAR | Status: AC
  Filled 2018-08-04: qty 6

## 2018-08-04 NOTE — ED Provider Notes (Signed)
[facility] EMERGENCY DEPARTMENT Provider Note   CSN: [id number] Arrival date & time: 08/04/18  1339     History   Chief Complaint Chief Complaint  Patient presents with  . Leg Pain    HPI Kevin Frank is a 78 y.o. male.  Patient complains of right knee swelling and pain.  He has a history of osteoarthritis and has had this knee drained twice.  Patient has significant swelling to that knee.  His right knee was injected with steroids pain medicine yesterday.  He try to get into his orthopedics to get the drainage done.  But they were unable to see him  The history is provided by the patient. No language interpreter was used.  Leg Pain  Lower extremity pain location: Right knee. Pain details:    Quality:  Aching   Radiates to:  Does not radiate   Severity:  Moderate   Onset quality:  Sudden   Timing:  Constant   Progression:  Worsening Chronicity:  Recurrent Dislocation: no   Foreign body present:  No foreign bodies Tetanus status:  Up to date Prior injury to area:  Yes Relieved by:  Nothing Associated symptoms: no back pain and no fatigue     Past Medical History:  Diagnosis Date  . AAA (abdominal aortic aneurysm) ([place]) 01/2008   3.2cm  . Asthma   . BPH (benign prostatic hyperplasia)   . Chronic knee pain    bilateral  . COPD (chronic obstructive pulmonary disease) ([place])   . Diabetes mellitus    type II  . Gout   . History of kidney stones   . Hypercholesterolemia   . Hyperlipidemia   . Hypertension   . Normal nuclear stress test 2011  . Obesity   . Obstructive sleep apnea on CPAP   . Presence of indwelling urinary catheter   . Shortness of breath    due to cigarette abuse  . Tobacco abuse     Patient Active Problem List   Diagnosis Date Noted  . Squamous cell carcinoma of right lung ([place]) 03/29/2018  . Seasonal and perennial allergic rhinitis 09/22/2017  . COPD with asthma ([place]) 09/22/2017  . Obstructive sleep apnea on CPAP 04/23/2017  .  Chronic knee pain 04/23/2017  . AKI (acute kidney injury) ([place]) 04/23/2017  . Chronic diastolic CHF (congestive heart failure) ([place]) 04/23/2017  . Cellulitis 04/23/2017  . Cellulitis of left lower leg   . Smoking greater than 40 pack years 03/05/2016  . Incidental lung nodule, > 57mm and < 56mm 03/05/2016  . Stage 2 moderate COPD by GOLD classification ([place]) 03/05/2016  . AAA (abdominal aortic aneurysm) without rupture ([place]) 06/14/2015  . OBSTRUCTIVE SLEEP APNEA 06/09/2007  . HYPERCHOLESTEROLEMIA 05/06/2007  . GOUT 05/06/2007  . DEPRESSION 05/06/2007  . Hypertension, essential 05/06/2007  . ABDOMINAL AORTIC ANEURYSM 05/06/2007  . BENIGN PROSTATIC HYPERTROPHY, HX OF 05/06/2007    Past Surgical History:  Procedure Laterality Date  . ABDOMINAL AORTIC ENDOVASCULAR FENESTRATED STENT GRAFT N/A 04/09/2016   Procedure: ABDOMINAL AORTIC ENDOVASCULAR FENESTRATED STENT GRAFT with left brachial artery access using ultrasound guidance;  Surgeon: Serafina Mitchell, MD;  Location: [place];  Service: Vascular;  Laterality: N/A;  . [place]   [place]  . PERIPHERAL VASCULAR CATHETERIZATION N/A 04/10/2016   Procedure: Renal Angiography;  Surgeon: Angelia Mould, MD;  Location: [place] [facility];  Service: Cardiovascular;  Laterality: N/A;  . TRANSURETHRAL RESECTION OF PROSTATE  01/07/2011   Procedure: TRANSURETHRAL RESECTION OF  THE PROSTATE (TURP);  Surgeon: Marissa Nestle;  Location: [facility];  Service: Urology;  Laterality: N/A;        Home Medications    Prior to Admission medications   Medication Sig Start Date End Date Taking? Authorizing Provider  albuterol (PROVENTIL HFA;VENTOLIN HFA) 108 (90 BASE) MCG/ACT inhaler Inhale 2 puffs into the lungs every 6 (six) hours as needed (rescue inhaler).    Yes [provider]  albuterol (PROVENTIL) (2.5 MG/3ML) 0.083% nebulizer solution 1 vial mixed with Pulmicort nebulizer solution 2 times daily for 2 weeks. 07/30/18  Yes Valentina Shaggy, MD  allopurinol (ZYLOPRIM) 300 MG tablet Take 300 mg by mouth daily.     Yes [provider]  amLODipine (NORVASC) 10 MG tablet TAKE 1 TABLET EVERY DAY 03/24/18  Yes Sherren Mocha, MD  atorvastatin (LIPITOR) 20 MG tablet Take 20 mg by mouth daily.  10/13/13  Yes [provider]  azelastine (ASTELIN) 0.1 % nasal spray Place 1 spray into both nostrils 2 (two) times daily. Use in each nostril as directed   Yes [provider]  budesonide (PULMICORT) 0.5 MG/2ML nebulizer solution Take 2 mLs (0.5 mg total) by nebulization 2 (two) times daily. 07/30/18  Yes Valentina Shaggy, MD  clopidogrel (PLAVIX) 75 MG tablet TAKE (1) TABLET BY MOUTH ONCE DAILY. 03/04/17  Yes Waynetta Sandy, MD  levofloxacin (LEVAQUIN) 500 MG tablet Take 500 mg by mouth daily.  07/30/18  Yes [provider]  losartan (COZAAR) 100 MG tablet TAKE 1 TABLET (100 MG TOTAL) BY MOUTH DAILY. 12/25/17 08/04/18 Yes Burtis Junes, NP  metFORMIN (GLUCOPHAGE) 500 MG tablet Take 1,000 mg by mouth 2 (two) times daily with a meal.  05/31/16  Yes [provider]  metoprolol succinate (TOPROL-XL) 25 MG 24 hr tablet TAKE 1 TABLET EVERY DAY 03/24/18  Yes Sherren Mocha, MD  sertraline (ZOLOFT) 50 MG tablet Take 50 mg by mouth daily.     Yes [provider]  torsemide (DEMADEX) 20 MG tablet Take 40 mg by mouth daily.    Yes [provider]  Donnal Debar 100-62.5-25 MCG/INH AEPB INHALE 1 PUFF INTO THE LUNGS DAILY--RINSE, GARGLE AND SPIT AFTER USE. 06/28/18  Yes Valentina Shaggy, MD  hydrALAZINE (APRESOLINE) 50 MG tablet Take 1 tablet (50 mg total) by mouth 2 (two) times daily. Patient not taking: Reported on 08/04/2018 01/26/18 08/04/18  Burtis Junes, NP    Family History Family History  Problem Relation Age of Onset  . Liver disease Father   . Lymphoma Mother   . Diabetes Mother   . Hypertension Mother   . Cancer Mother   . Mental retardation Other         sibling -- from birth   . Asthma Daughter   . Allergic rhinitis Neg Hx   . Angioedema Neg Hx   . Atopy Neg Hx   . Eczema Neg Hx   . Immunodeficiency Neg Hx     Social History Social History   Tobacco Use  . Smoking status: Current Every Day Smoker    Packs/day: 3.00    Years: 50.00    Pack years: 150.00    Types: Cigarettes  . Smokeless tobacco: Never Used  . Tobacco comment: 2ppd 10/30/2016 ee  Substance Use Topics  . Alcohol use: Yes    Alcohol/week: 0.0 standard drinks    Comment: very little  . Drug use: No     Allergies   No known allergies  Review of Systems Review of Systems  Constitutional: Negative for appetite change and fatigue.  HENT: Negative for congestion, ear discharge and sinus pressure.   Eyes: Negative for discharge.  Respiratory: Negative for cough.   Cardiovascular: Negative for chest pain.  Gastrointestinal: Negative for abdominal pain and diarrhea.  Genitourinary: Negative for frequency and hematuria.  Musculoskeletal: Negative for back pain.       Right knee pain  Skin: Negative for rash.  Neurological: Negative for seizures and headaches.  Psychiatric/Behavioral: Negative for hallucinations.     Physical Exam Updated Vital Signs BP 114/61   Pulse (!) 57   Temp 97.7 F (36.5 C) (Oral)   Resp 20   Ht 5\' 10"  (1.778 m)   Wt 90.7 kg   SpO2 92%   BMI 28.70 kg/m   Physical Exam Constitutional:      Appearance: He is well-developed.  HENT:     Head: Normocephalic.  Eyes:     General: No scleral icterus.    Conjunctiva/sclera: Conjunctivae normal.  Neck:     Musculoskeletal: Neck supple.     Thyroid: No thyromegaly.  Cardiovascular:     Rate and Rhythm: Normal rate and regular rhythm.     Heart sounds: No murmur. No friction rub. No gallop.   Pulmonary:     Breath sounds: No stridor. No wheezing or rales.  Chest:     Chest wall: No tenderness.  Abdominal:     General: There is no distension.     Tenderness: There is no  abdominal tenderness. There is no rebound.  Musculoskeletal: Normal range of motion.     Comments: Swollen tender right knee.  Lymphadenopathy:     Cervical: No cervical adenopathy.  Skin:    Findings: No erythema or rash.  Neurological:     Mental Status: He is oriented to person, place, and time.     Motor: No abnormal muscle tone.     Coordination: Coordination normal.  Psychiatric:        Behavior: Behavior normal.      ED Treatments / Results  Labs (all labs ordered are listed, but only abnormal results are displayed) Labs Reviewed - No data to display  EKG None  Radiology Dg Knee Complete 4 Views Right  Result Date: 08/04/2018 CLINICAL DATA:  Pain and swelling in knee for a. EXAM: RIGHT KNEE - COMPLETE 4+ VIEW COMPARISON:  07/03/2016. FINDINGS: Moderate-sized suprapatellar joint effusion. Tricompartmental degenerative change, with cartilage calcification. No fracture, dislocation, or worrisome osseous lesion. Atherosclerosis. IMPRESSION: Tricompartmental degenerative change with suprapatellar joint effusion. Compared with priors, the effusion is increased. Electronically Signed   By: Staci Righter M.D.   On: 08/04/2018 15:23    Procedures .Ortho Injury Treatment Date/Time: 08/04/2018 4:07 PM Performed by: Milton Ferguson, MD Authorized by: Milton Ferguson, MD   Consent:    Consent obtained:  Verbal   Consent given by:  Patient   Risks discussed:  Nerve damage   Alternatives discussed:  No treatmentInjury location: lower leg Pre-procedure distal perfusion: normal Pre-procedure neurological function: normal Pre-procedure range of motion: reduced Anesthesia: local infiltration  Anesthesia: Local anesthesia used: yes Local Anesthetic: lidocaine 1% without epinephrine  Patient sedated: NoComments: Patient had an effusion to his right knee.  The area was cleaned thoroughly with Betadine.  He was anesthetized with 1% lidocaine without epi and an 18-gauge needle was used  to drain 45 cc of blood-tinged fluid.  Patient tolerated the procedure well    (including critical care time)  Medications Ordered in ED Medications  lidocaine (PF) (XYLOCAINE) 1 % injection (has no administration in time range)  povidone-iodine (BETADINE) 10 % external solution (has no administration in time range)     Initial Impression / Assessment and Plan / ED Course  I have reviewed the triage vital signs and the nursing notes.  Pertinent labs & imaging results that were available during my care of the patient were reviewed by me and considered in my medical decision making (see chart for details).    Patient with an effusion to his right knee secondary to osteoarthritis.  He will follow-up with orthopedics  Final Clinical Impressions(s) / ED Diagnoses   Final diagnoses:  Knee effusion, right    ED Discharge Orders    None       Milton Ferguson, MD 08/04/18 [phone]

## 2018-08-04 NOTE — ED Triage Notes (Signed)
PT c/o leg pain recurrently starting over the past week. PT states he is on the second 10 day course of Levaquin for recent pneumonia/COPD exacerbertation and they have doubled his metformin as well due to increased blood sugars this past 2 weeks.

## 2018-08-04 NOTE — Discharge Instructions (Addendum)
Keep your leg elevated.  Follow-up with Dr. Aline Brochure as planned next week.

## 2018-08-06 ENCOUNTER — Ambulatory Visit (INDEPENDENT_AMBULATORY_CARE_PROVIDER_SITE_OTHER): Payer: Medicare HMO | Admitting: *Deleted

## 2018-08-06 DIAGNOSIS — J4540 Moderate persistent asthma, uncomplicated: Secondary | ICD-10-CM | POA: Diagnosis not present

## 2018-08-06 DIAGNOSIS — I10 Essential (primary) hypertension: Secondary | ICD-10-CM | POA: Diagnosis not present

## 2018-08-06 DIAGNOSIS — E1165 Type 2 diabetes mellitus with hyperglycemia: Secondary | ICD-10-CM | POA: Diagnosis not present

## 2018-08-06 DIAGNOSIS — J449 Chronic obstructive pulmonary disease, unspecified: Secondary | ICD-10-CM | POA: Diagnosis not present

## 2018-08-06 DIAGNOSIS — M25561 Pain in right knee: Secondary | ICD-10-CM | POA: Diagnosis not present

## 2018-08-09 ENCOUNTER — Encounter

## 2018-08-09 ENCOUNTER — Ambulatory Visit: Payer: Medicare HMO | Admitting: Orthopedic Surgery

## 2018-08-09 ENCOUNTER — Encounter: Payer: Self-pay | Admitting: Orthopedic Surgery

## 2018-08-09 VITALS — BP 115/66 | HR 73 | Ht 70.0 in | Wt 220.0 lb

## 2018-08-09 DIAGNOSIS — M1120 Other chondrocalcinosis, unspecified site: Secondary | ICD-10-CM

## 2018-08-09 DIAGNOSIS — M25461 Effusion, right knee: Secondary | ICD-10-CM

## 2018-08-09 NOTE — Progress Notes (Signed)
RECURRENT PROBLEM // OFFICE VISIT  Chief Complaint  Patient presents with  . 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  Skin: Negative for rash.     Past Medical History:  Diagnosis Date  . AAA (abdominal aortic aneurysm) ([place]) 01/2008   3.2cm  . Asthma   . BPH (benign prostatic hyperplasia)   . Chronic knee pain    bilateral  . COPD (chronic obstructive pulmonary disease) ([place])   . Diabetes mellitus    type II  . Gout   . History of kidney stones   . Hypercholesterolemia   . Hyperlipidemia   . Hypertension   . Normal nuclear stress test 2011  . Obesity   . Obstructive sleep apnea on CPAP   . Presence of indwelling urinary catheter   . Shortness of breath    due to cigarette abuse  . Tobacco abuse     Past Surgical History:  Procedure Laterality Date  . ABDOMINAL AORTIC ENDOVASCULAR FENESTRATED STENT GRAFT N/A 04/09/2016   Procedure: ABDOMINAL AORTIC ENDOVASCULAR FENESTRATED STENT GRAFT with left brachial artery access using ultrasound guidance;  Surgeon: Serafina Mitchell, MD;  Location: [place];  Service: Vascular;  Laterality: N/A;  . [place]   [place]  . PERIPHERAL VASCULAR CATHETERIZATION N/A 04/10/2016   Procedure: Renal Angiography;  Surgeon: Angelia Mould, MD;   Location: [place] [facility];  Service: Cardiovascular;  Laterality: N/A;  . TRANSURETHRAL RESECTION OF PROSTATE  01/07/2011   Procedure: TRANSURETHRAL RESECTION OF THE PROSTATE (TURP);  Surgeon: Marissa Nestle;  Location: [facility];  Service: Urology;  Laterality: N/A;    Family History  Problem Relation Age of Onset  . Liver disease Father   . Lymphoma Mother   . Diabetes Mother   . Hypertension Mother   . Cancer Mother   . Mental retardation Other        sibling -- from birth   . Asthma Daughter   . Allergic rhinitis Neg Hx   . Angioedema Neg Hx   . Atopy Neg Hx   . Eczema Neg Hx   . Immunodeficiency Neg Hx    Social History   Tobacco Use  . Smoking status: Current Every Day Smoker    Packs/day: 3.00    Years: 50.00    Pack years: 150.00    Types: Cigarettes  . Smokeless tobacco: Never Used  . Tobacco comment: 2ppd 10/30/2016 ee  Substance Use Topics  . Alcohol use: Yes    Alcohol/week: 0.0 standard drinks    Comment: very little  . Drug use: No    Allergies  Allergen Reactions  . No Known Allergies     Current Meds  Medication Sig  . allopurinol (ZYLOPRIM) 300 MG tablet Take 300 mg by mouth daily.    [name]  clopidogrel (PLAVIX) 75 MG tablet TAKE (1) TABLET BY MOUTH ONCE DAILY.  [name] losartan (COZAAR) 100 MG tablet TAKE 1 TABLET (100 MG TOTAL) BY MOUTH DAILY.  . metFORMIN (GLUCOPHAGE) 500 MG tablet Take 1,000 mg by mouth 2 (two) times daily with a meal.   . metoprolol succinate (TOPROL-XL) 25 MG 24 hr tablet TAKE 1 TABLET EVERY DAY  . predniSONE (DELTASONE) 10 MG tablet   . sertraline (ZOLOFT) 50 MG tablet Take 50 mg by mouth daily.    [name] tiotropium (SPIRIVA) 18 MCG inhalation capsule Place 18 mcg into inhaler and inhale daily.  [name] torsemide (DEMADEX) 20 MG tablet Take 40 mg by mouth daily.   . TRELEGY ELLIPTA 100-62.5-25 MCG/INH AEPB INHALE 1 PUFF INTO THE LUNGS DAILY--RINSE, GARGLE AND SPIT AFTER USE.   Current Facility-Administered Medications for the 08/09/18  encounter (Office Visit) with Carole Civil, MD  Medication  . ipratropium (ATROVENT) nebulizer solution 0.5 mg  . levalbuterol (XOPENEX) nebulizer solution 1.25 mg  . omalizumab (XOLAIR) injection 375 mg    BP 115/66   Pulse 73   Ht 5\' 10"  (1.778 m)   Wt 220 lb (99.8 kg)   BMI 31.57 kg/m   Physical Exam Vitals signs reviewed.  Constitutional:      Appearance: Normal appearance. He is well-developed.  Skin:    General: Skin is warm and dry.     Findings: No erythema.  Neurological:     Mental Status: He is alert and oriented to person, place, and time.  Psychiatric:        Mood and Affect: Mood and affect normal.     Ortho Exam  Multiple injection and aspiration sites are seen on the right knee he has no effusion today is bending it well is nontender there is no erythema knee is stable the joint lines are nontender muscle strength and tone are normal  MEDICAL DECISION SECTION  Xrays were done at Hospital  My independent reading of xrays:  He has chondrocalcinosis mild degenerative arthritis in the knee no acute fracture dislocation or bony abnormality  Encounter Diagnoses  Name Primary?  . Chondrocalcinosis Yes  . Effusion of right knee     PLAN: (Rx., injectx, surgery, frx, mri/ct) Finish the steroid taper  Feel free to call us if the knee swells up again.  No orders of the defined types were placed in this encounter.   Arther Abbott, MD  08/09/2018 11:22 AM

## 2018-08-10 ENCOUNTER — Encounter: Payer: Self-pay | Admitting: Nutrition

## 2018-08-10 ENCOUNTER — Encounter: Payer: Medicare HMO | Attending: Pulmonary Disease | Admitting: Nutrition

## 2018-08-10 VITALS — Ht 70.0 in | Wt 227.0 lb

## 2018-08-10 DIAGNOSIS — E118 Type 2 diabetes mellitus with unspecified complications: Secondary | ICD-10-CM | POA: Insufficient documentation

## 2018-08-10 DIAGNOSIS — E782 Mixed hyperlipidemia: Secondary | ICD-10-CM | POA: Diagnosis not present

## 2018-08-10 DIAGNOSIS — E669 Obesity, unspecified: Secondary | ICD-10-CM

## 2018-08-10 DIAGNOSIS — E1165 Type 2 diabetes mellitus with hyperglycemia: Secondary | ICD-10-CM | POA: Diagnosis not present

## 2018-08-10 DIAGNOSIS — IMO0002 Reserved for concepts with insufficient information to code with codable children: Secondary | ICD-10-CM

## 2018-08-10 DIAGNOSIS — I10 Essential (primary) hypertension: Secondary | ICD-10-CM | POA: Diagnosis not present

## 2018-08-10 NOTE — Patient Instructions (Signed)
Goals Follow My Plate Eat meals on time Increase fresh fruits and vegetables.  Cut out diet soda and drink water Cut out sweets Test blood sugars twice a day.

## 2018-08-10 NOTE — Progress Notes (Signed)
  Medical Nutrition Therapy:  Appt start time: 5284 end time:  1630.  Assessment:  Primary concerns today: Diabetes Type 2. Here with is wife. His wife does the cooking and shopping. Eats thee meals per day. Radiation x 3 on his lung November 2019.  Smoker  A1C was 11% about a month ago. Has a knee issue and takes prednisone since last week.  Had been on prednisone also for some infection in his chest Dec/Jan Willing to work on eating better balanced meals, watch portions and cut out sweets and high carb processed foods.  Wife is supportive.   Preferred Learning Style:  No preference indicated   Learning Readiness:     Ready  Change in progress   MEDICATIONS:    DIETARY INTAKE:   24-hr recall:  B ( AM): Loaded hash browns, bacon, toast- 2 slices, Diet Coke Snk ( AM):   L ( PM): [place]-roast beef, broccoli with cheese,  Potatoes, butter beans,  Green beans, roll,  Diet coke, 1/2 c ice cream Snk ( PM):  D ( PM): Vegetales beef souip, 2 cup,  8 crackers Water Snk ( PM): occasionally cookies. Beverages: Diet sodas.  Usual physical activity: ADL  Estimated energy needs: 1800 calories 200 g carbohydrates 135 g protein 50 g fat  Progress Towards Goal(s):  In progress.   Nutritional Diagnosis:  NB-1.1 Food and nutrition-related knowledge deficit As related to Diabetes.  As evidenced by A1C 11%.    Intervention:  Nutrition and Diabetes education provided on My Plate, CHO counting, meal planning, portion sizes, timing of meals, avoiding snacks between meals unless having a low blood sugar, target ranges for A1C and blood sugars, signs/symptoms and treatment of hyper/hypoglycemia, monitoring blood sugars, taking medications as prescribed, benefits of exercising 30 minutes per day and prevention of complications of DM. [name] Goals Follow My Plate Eat meals on time Increase fresh fruits and vegetables.  Cut out diet soda and drink water Cut out sweets Test blood sugars  twice a day.   Teaching Method Utilized:  Visual Auditory Hands on  Handouts given during visit include:  The Plate Method  Meal Plan   Barriers to learning/adherence to lifestyle change: none  Demonstrated degree of understanding via:  Teach Back   Monitoring/Evaluation:  Dietary intake, exercise, meal planning, and body weight in 1 month(s).

## 2018-08-17 ENCOUNTER — Other Ambulatory Visit: Payer: Self-pay

## 2018-08-17 ENCOUNTER — Inpatient Hospital Stay (HOSPITAL_COMMUNITY): Payer: Medicare HMO | Admitting: Hematology

## 2018-08-17 ENCOUNTER — Encounter (HOSPITAL_COMMUNITY): Payer: Self-pay | Admitting: Hematology

## 2018-08-17 VITALS — BP 127/47 | HR 85 | Temp 98.3°F | Resp 20 | Wt 229.5 lb

## 2018-08-17 DIAGNOSIS — C3411 Malignant neoplasm of upper lobe, right bronchus or lung: Secondary | ICD-10-CM | POA: Diagnosis not present

## 2018-08-17 DIAGNOSIS — Z720 Tobacco use: Secondary | ICD-10-CM | POA: Diagnosis not present

## 2018-08-17 DIAGNOSIS — R05 Cough: Secondary | ICD-10-CM | POA: Diagnosis not present

## 2018-08-17 DIAGNOSIS — C3491 Malignant neoplasm of unspecified part of right bronchus or lung: Secondary | ICD-10-CM | POA: Diagnosis not present

## 2018-08-17 DIAGNOSIS — J189 Pneumonia, unspecified organism: Secondary | ICD-10-CM

## 2018-08-17 NOTE — Assessment & Plan Note (Signed)
1.  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  A CT scan of the chest PE protocol on 02/04/2018 did not show pulmonary embolism.  However it showed 2.0 x 1.0 x 0.9 cm right upper lobe lung nodule. -A PET CT scan on 03/08/2018 shows 2.4 x 0.8 cm right upper lobe nodule with SUV of 2.9.  No adenopathy or metastatic disease was seen. -CT-guided biopsy on 03/19/2018 shows squamous cell carcinoma. -He was evaluated by Dr. Servando Snare and was felt to be high risk surgical candidate. -MRI of the brain on 04/01/2018 did not show any evidence of metastatic disease. - SBRT from 05/05/2018 through 05/12/2018, 54 Gy in 3 fractions of 18 Gy - We reviewed the results of the CT scan dated 07/20/2018 which shows minimal decrease in size of the right upper lobe pulmonary nodule measuring 2.1 x 1 cm.  New multifocal airspace and nodular opacities consistent with infection. -He was treated with antibiotics.  His respiratory symptoms have improved.  However he is sleeping more than usual since the radiation.  His blood work was within normal limits.  No chest pains reported. - I have recommended follow-up visit in 3 months with repeat CT of the chest with contrast.

## 2018-08-17 NOTE — Patient Instructions (Signed)
[place] at [facility] Discharge Instructions  Today you saw Dr. Delton Coombes   Thank you for choosing [place] at [facility] to provide your oncology and hematology care.  To afford each patient quality time with our provider, please arrive at least 15 minutes before your scheduled appointment time.   If you have a lab appointment with the [facility] please come in thru the  Main Entrance and check in at the main information desk  You need to re-schedule your appointment should you arrive 10 or more minutes late.  We strive to give you quality time with our providers, and arriving late affects you and other patients whose appointments are after yours.  Also, if you no show three or more times for appointments you may be dismissed from the clinic at the providers discretion.     Again, thank you for choosing [facility].  Our hope is that these requests will decrease the amount of time that you wait before being seen by our physicians.       _____________________________________________________________  Should you have questions after your visit to [facility], please contact our office at [phone] between the hours of 8:00 a.m. and 4:30 p.m.  Voicemails left after 4:00 p.m. will not be returned until the following business day.  For prescription refill requests, have your pharmacy contact our office and allow 72 hours.    Cancer Center Support Programs:   > Cancer Support Group  2nd Tuesday of the month 1pm-2pm, Journey Room

## 2018-08-17 NOTE — Progress Notes (Signed)
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  CURRENT THERAPY: observation  BRIEF ONCOLOGIC HISTORY:    Squamous cell carcinoma of right lung ([place])   03/29/2018 Initial Diagnosis    Squamous cell carcinoma of right lung ([place])    03/29/2018 Cancer Staging    Staging form: Lung, AJCC 8th Edition - Clinical stage from 03/29/2018: Stage IA3 (cT1c, cN0, cM0) - Signed by Derek Jack, MD on 03/29/2018      CANCER STAGING: Cancer Staging Squamous cell carcinoma of right lung ([place]) Staging form: Lung, AJCC 8th Edition - Clinical stage from 03/29/2018: Stage IA3 (cT1c, cN0, cM0) - Signed by Derek Jack, MD on 03/29/2018    INTERVAL HISTORY:  Mr. Laneve 78 y.o. male returns for routine follow-up for squamous cell carcinoma of the right lung. He reports he is coughing still. He reports he is still smoking 2 packs per day. He denies any blood in his sputum. He reports he is wanting to sleep during the day and is more fatigued during the day. He is waking up in the middle of the night and wakes up early but he is needing naps during the day.  Denies any nausea, vomiting, or diarrhea. Denies any new pains. Had not noticed any recent bleeding such as epistaxis, hematuria or hematochezia. Denies recent chest pain on exertion, shortness of breath on minimal exertion, pre-syncopal episodes, or palpitations. Denies any numbness or tingling in hands or feet. Denies any recent fevers, infections, or recent hospitalizations. Patient reports appetite at 75% and energy level at 75%. He is eating well and maintaining his weight at this time.    REVIEW OF SYSTEMS:  Review of Systems  Constitutional: Positive for fatigue.  Respiratory: Positive for cough.   All other  systems reviewed and are negative.    PAST MEDICAL/SURGICAL HISTORY:  Past Medical History:  Diagnosis Date  . AAA (abdominal aortic aneurysm) ([place]) 01/2008   3.2cm  . Asthma   . BPH (benign prostatic hyperplasia)   . Chronic knee pain    bilateral  . COPD (chronic obstructive pulmonary disease) ([place])   . Diabetes mellitus    type II  . Gout   . History of kidney stones   . Hypercholesterolemia   . Hyperlipidemia   . Hypertension   . Normal nuclear stress test 2011  . Obesity   . Obstructive sleep apnea on CPAP   . Presence of indwelling urinary catheter   . Shortness of breath    due to cigarette abuse  . Tobacco abuse    Past Surgical History:  Procedure Laterality Date  . ABDOMINAL AORTIC ENDOVASCULAR FENESTRATED STENT GRAFT N/A 04/09/2016   Procedure: ABDOMINAL AORTIC ENDOVASCULAR FENESTRATED STENT GRAFT with left brachial artery access using ultrasound guidance;  Surgeon: Serafina Mitchell, MD;  Location: [facility];  Service: Vascular;  Laterality: N/A;  . [place]   [place]  . PERIPHERAL VASCULAR CATHETERIZATION N/A 04/10/2016   Procedure: Renal Angiography;  Surgeon: Angelia Mould, MD;  Location: [facility];  Service: Cardiovascular;  Laterality: N/A;  . TRANSURETHRAL RESECTION OF PROSTATE  01/07/2011   Procedure: TRANSURETHRAL RESECTION OF THE PROSTATE (TURP);  Surgeon: Marissa Nestle;  Location: [facility];  Service: Urology;  Laterality: N/A;     SOCIAL HISTORY:  Social History   Socioeconomic History  . Marital status: Married    Spouse name: Not on file  . Number of children: Not on file  . Years of education: Not on file  . Highest education level: Not on file  Occupational History  . Not on file  Social Needs  . Financial resource strain: Not on file  . Food insecurity:    Worry: Not on file    Inability: Not on file  . Transportation needs:    Medical: No    Non-medical: No  Tobacco Use  . Smoking status: Current Every Day  Smoker    Packs/day: 3.00    Years: 50.00    Pack years: 150.00    Types: Cigarettes  . Smokeless tobacco: Never Used  . Tobacco comment: 2ppd 10/30/2016 ee  Substance and Sexual Activity  . Alcohol use: Yes    Alcohol/week: 0.0 standard drinks    Comment: very little  . Drug use: No  . Sexual activity: Not on file  Lifestyle  . Physical activity:    Days per week: Not on file    Minutes per session: Not on file  . Stress: Not on file  Relationships  . Social connections:    Talks on phone: Not on file    Gets together: Not on file    Attends religious service: Not on file    Active member of club or organization: Not on file    Attends meetings of clubs or organizations: Not on file    Relationship status: Not on file  . Intimate partner violence:    Fear of current or ex partner: Not on file    Emotionally abused: Not on file    Physically abused: Not on file    Forced sexual activity: Not on file  Other Topics Concern  . Not on file  Social History Narrative  . Not on file    FAMILY HISTORY:  Family History  Problem Relation Age of Onset  . Liver disease Father   . Lymphoma Mother   . Diabetes Mother   . Hypertension Mother   . Cancer Mother   . Mental retardation Other        sibling -- from birth   . Asthma Daughter   . Allergic rhinitis Neg Hx   . Angioedema Neg Hx   . Atopy Neg Hx   . Eczema Neg Hx   . Immunodeficiency Neg Hx     CURRENT MEDICATIONS:  Outpatient Encounter Medications as of 08/17/2018  Medication Sig  . albuterol (PROVENTIL HFA;VENTOLIN HFA) 108 (90 BASE) MCG/ACT inhaler Inhale 2 puffs into the lungs every 6 (six) hours as needed (rescue inhaler).   [name] allopurinol (ZYLOPRIM) 300 MG tablet Take 300 mg by mouth daily.    [name] amLODipine (NORVASC) 10 MG tablet TAKE 1 TABLET EVERY DAY  . atorvastatin (LIPITOR) 20 MG tablet Take 20 mg by mouth daily.   [name] azelastine (ASTELIN) 0.1 % nasal spray Place 1 spray into both nostrils 2 (two) times  daily. Use in each nostril as directed  . clopidogrel (PLAVIX) 75 MG tablet TAKE (1) TABLET BY MOUTH ONCE DAILY.  . hydrALAZINE (APRESOLINE) 50 MG tablet Take 50 mg by mouth daily.  . metFORMIN (GLUCOPHAGE) 500 MG tablet Take 1,000 mg by mouth 2 (two) times daily with a meal.   . metoprolol succinate (TOPROL-XL) 25 MG 24 hr tablet TAKE 1 TABLET EVERY DAY  . sertraline (ZOLOFT) 50 MG tablet Take 50  mg by mouth daily.    [name] torsemide (DEMADEX) 20 MG tablet Take 40 mg by mouth daily.   . TRELEGY ELLIPTA 100-62.5-25 MCG/INH AEPB INHALE 1 PUFF INTO THE LUNGS DAILY--RINSE, GARGLE AND SPIT AFTER USE.  . hydrALAZINE (APRESOLINE) 50 MG tablet Take 1 tablet (50 mg total) by mouth 2 (two) times daily. (Patient not taking: Reported on 08/04/2018)  . losartan (COZAAR) 100 MG tablet TAKE 1 TABLET (100 MG TOTAL) BY MOUTH DAILY.  . [DISCONTINUED] albuterol (PROVENTIL) (2.5 MG/3ML) 0.083% nebulizer solution 1 vial mixed with Pulmicort nebulizer solution 2 times daily for 2 weeks.  . [DISCONTINUED] budesonide (PULMICORT) 0.5 MG/2ML nebulizer solution Take 2 mLs (0.5 mg total) by nebulization 2 (two) times daily.  . [DISCONTINUED] predniSONE (DELTASONE) 10 MG tablet   . [DISCONTINUED] tiotropium (SPIRIVA) 18 MCG inhalation capsule Place 18 mcg into inhaler and inhale daily.   Facility-Administered Encounter Medications as of 08/17/2018  Medication  . ipratropium (ATROVENT) nebulizer solution 0.5 mg  . levalbuterol (XOPENEX) nebulizer solution 1.25 mg  . omalizumab Arvid Right) injection 375 mg    ALLERGIES:  Allergies  Allergen Reactions  . No Known Allergies      PHYSICAL EXAM:  ECOG Performance status: 1  Vitals:   08/17/18 1045  BP: (!) 127/47  Pulse: 85  Resp: 20  Temp: 98.3 F (36.8 C)  SpO2: 95%   Filed Weights   08/17/18 1045  Weight: 229 lb 8 oz (104.1 kg)    Physical Exam Constitutional:      Appearance: Normal appearance. He is normal weight.  Cardiovascular:     Rate and Rhythm:  Normal rate and regular rhythm.     Heart sounds: Normal heart sounds.  Pulmonary:     Effort: Pulmonary effort is normal.     Breath sounds: Normal breath sounds.  Musculoskeletal: Normal range of motion.  Skin:    General: Skin is warm and dry.  Neurological:     Mental Status: He is alert and oriented to person, place, and time. Mental status is at baseline.  Psychiatric:        Mood and Affect: Mood normal.        Behavior: Behavior normal.        Thought Content: Thought content normal.        Judgment: Judgment normal.      LABORATORY DATA:  I have reviewed the labs as listed.  CBC    Component Value Date/Time   WBC 10.1 07/27/2018 0951   RBC 4.84 07/27/2018 0951   HGB 15.1 07/27/2018 0951   HCT 45.9 07/27/2018 0951   PLT 222 07/27/2018 0951   MCV 94.8 07/27/2018 0951   MCH 31.2 07/27/2018 0951   MCHC 32.9 07/27/2018 0951   RDW 13.8 07/27/2018 0951   LYMPHSABS 1.6 07/27/2018 0951   MONOABS 1.0 07/27/2018 0951   EOSABS 0.4 07/27/2018 0951   BASOSABS 0.1 07/27/2018 0951   CMP Latest Ref Rng & Units 07/27/2018 07/20/2018 03/19/2018  Glucose 70 - 99 mg/dL 157(H) - 359(H)  BUN 8 - 23 mg/dL 14 - 27(H)  Creatinine 0.61 - 1.24 mg/dL 0.99 1.10 1.37(H)  Sodium 135 - 145 mmol/L 133(L) - 137  Potassium 3.5 - 5.1 mmol/L 3.3(L) - 4.1  Chloride 98 - 111 mmol/L 99 - 100  CO2 22 - 32 mmol/L 23 - 27  Calcium 8.9 - 10.3 mg/dL 8.9 - 9.4  Total Protein 6.5 - 8.1 g/dL 7.2 - -  Total Bilirubin 0.3 - 1.2 mg/dL 0.6 - -  Alkaline Phos 38 - 126 U/L 68 - -  AST 15 - 41 U/L 21 - -  ALT 0 - 44 U/L 28 - -       DIAGNOSTIC IMAGING:  I have independently reviewed the scans and discussed with the patient.   I have reviewed Francene Finders, NP's note and agree with the documentation.  I personally performed a face-to-face visit, made revisions and my assessment and plan is as follows.    ASSESSMENT & PLAN:   Squamous cell carcinoma of right lung (HCC) 1.  Stage I (T1c N0 M0) squamous  cell carcinoma of the right upper lobe of the lung: - Patient had worsening shortness of breath in the past few months.  A CT scan of the chest PE protocol on 02/04/2018 did not show pulmonary embolism.  However it showed 2.0 x 1.0 x 0.9 cm right upper lobe lung nodule. -A PET CT scan on 03/08/2018 shows 2.4 x 0.8 cm right upper lobe nodule with SUV of 2.9.  No adenopathy or metastatic disease was seen. -CT-guided biopsy on 03/19/2018 shows squamous cell carcinoma. -He was evaluated by Dr. Servando Snare and was felt to be high risk surgical candidate. -MRI of the brain on 04/01/2018 did not show any evidence of metastatic disease. - SBRT from 05/05/2018 through 05/12/2018, 54 Gy in 3 fractions of 18 Gy - We reviewed the results of the CT scan dated 07/20/2018 which shows minimal decrease in size of the right upper lobe pulmonary nodule measuring 2.1 x 1 cm.  New multifocal airspace and nodular opacities consistent with infection. -He was treated with antibiotics.  His respiratory symptoms have improved.  However he is sleeping more than usual since the radiation.  His blood work was within normal limits.  No chest pains reported. - I have recommended follow-up visit in 3 months with repeat CT of the chest with contrast.      Orders placed this encounter:  Orders Placed This Encounter  Procedures  . CT Chest W Contrast  . Lactate dehydrogenase  . CBC with Differential/Platelet  . Comprehensive metabolic panel      Derek Jack, MD [place] [phone]

## 2018-08-20 ENCOUNTER — Ambulatory Visit (INDEPENDENT_AMBULATORY_CARE_PROVIDER_SITE_OTHER): Payer: Medicare HMO

## 2018-08-20 DIAGNOSIS — I739 Peripheral vascular disease, unspecified: Secondary | ICD-10-CM | POA: Diagnosis not present

## 2018-08-20 DIAGNOSIS — J4540 Moderate persistent asthma, uncomplicated: Secondary | ICD-10-CM

## 2018-08-20 DIAGNOSIS — C3491 Malignant neoplasm of unspecified part of right bronchus or lung: Secondary | ICD-10-CM | POA: Diagnosis not present

## 2018-08-20 DIAGNOSIS — M25561 Pain in right knee: Secondary | ICD-10-CM | POA: Diagnosis not present

## 2018-08-20 DIAGNOSIS — J449 Chronic obstructive pulmonary disease, unspecified: Secondary | ICD-10-CM | POA: Diagnosis not present

## 2018-08-20 DIAGNOSIS — I10 Essential (primary) hypertension: Secondary | ICD-10-CM | POA: Diagnosis not present

## 2018-09-01 ENCOUNTER — Ambulatory Visit (INDEPENDENT_AMBULATORY_CARE_PROVIDER_SITE_OTHER): Payer: Medicare HMO

## 2018-09-01 ENCOUNTER — Other Ambulatory Visit: Payer: Self-pay

## 2018-09-01 DIAGNOSIS — J4540 Moderate persistent asthma, uncomplicated: Secondary | ICD-10-CM | POA: Diagnosis not present

## 2018-09-08 ENCOUNTER — Ambulatory Visit: Payer: Medicare HMO | Admitting: Orthopedic Surgery

## 2018-09-08 ENCOUNTER — Other Ambulatory Visit: Payer: Self-pay

## 2018-09-08 VITALS — BP 137/69 | HR 68 | Ht 70.0 in | Wt 229.0 lb

## 2018-09-08 DIAGNOSIS — M25461 Effusion, right knee: Secondary | ICD-10-CM | POA: Diagnosis not present

## 2018-09-08 DIAGNOSIS — M1120 Other chondrocalcinosis, unspecified site: Secondary | ICD-10-CM

## 2018-09-08 MED ORDER — FEBUXOSTAT 40 MG PO TABS: 40.0000 mg | ORAL_TABLET | Freq: Every day | ORAL | 0 refills | Status: DC

## 2018-09-08 NOTE — Patient Instructions (Signed)
Rest for a few days   Ice it 3 x a day

## 2018-09-08 NOTE — Progress Notes (Signed)
Chief Complaint  Patient presents with  . Follow-up    recheck on right knee   Kevin Frank comes in with a 2 to 3-day history of acute onset of swelling in his right knee which she has on a chronic basis.  He has a history of gout.  He has some medical problems which prevent [place] from actually doing any surgery on him  He has a fairly good interval until he was working on some equipment and then he noticed that his pain and swelling returned in that right knee.  It is associated with stiffness when it occurs he is having that now he has had no erythema around the knee on the right side and his pain now is mild but it was moderate.  Past Medical History:  Diagnosis Date  . AAA (abdominal aortic aneurysm) ([place]) 01/2008   3.2cm  . Asthma   . BPH (benign prostatic hyperplasia)   . Chronic knee pain    bilateral  . COPD (chronic obstructive pulmonary disease) ([place])   . Diabetes mellitus    type II  . Gout   . History of kidney stones   . Hypercholesterolemia   . Hyperlipidemia   . Hypertension   . Normal nuclear stress test 2011  . Obesity   . Obstructive sleep apnea on CPAP   . Presence of indwelling urinary catheter   . Shortness of breath    due to cigarette abuse  . Tobacco abuse    BP 137/69   Pulse 68   Ht 5\' 10"  (1.778 m)   Wt 229 lb (103.9 kg)   BMI 32.86 kg/m  Physical Exam Vitals signs and nursing note reviewed.  Constitutional:      Appearance: Normal appearance.  Neurological:     Mental Status: He is alert and oriented to person, place, and time.     Gait: Gait abnormal.     Comments: Gait supported by a walker  Psychiatric:        Mood and Affect: Mood normal.     Right knee large joint effusion no real tenderness to palpation he actually has 5 to 120 degrees flexion with no pain his knee is stable muscle tone and strength are normal skin is without erythema mild peripheral edema sensation is normal   Procedure note injection and aspiration right knee  joint  Verbal consent was obtained to aspirate and inject the right knee joint   Timeout was completed to confirm the site of aspiration and injection  An 18-gauge needle was used to aspirate the knee joint from a suprapatellar lateral approach.  The medications used were 40 mg of Depo-Medrol and 1% lidocaine 3 cc  Anesthesia was provided by ethyl chloride and the skin was prepped with alcohol.  After cleaning the skin with alcohol an 18-gauge needle was used to aspirate the right knee joint.  We obtained 60  cc of fluid, the fluid was yellow thick and clear did not look infectious  We follow this by injection of 40 mg of Depo-Medrol and 3 cc 1% lidocaine.  There were no complications. A sterile bandage was applied.  Encounter Diagnoses  Name Primary?  . Effusion of right knee Yes  . Chondrocalcinosis      Plan recommend  ice   rest   ULORIC   continue allopurinol

## 2018-09-09 ENCOUNTER — Telehealth: Payer: Self-pay | Admitting: Orthopedic Surgery

## 2018-09-09 NOTE — Telephone Encounter (Signed)
Ankle swelling : if there wasn't a fall check with pmd on hs fluid medication

## 2018-09-09 NOTE — Telephone Encounter (Signed)
Patient/wife(designated contact) called, states knee was feeling better last evening following aspiration; said now ankle is swollen and hurting. Please advise. [phone]

## 2018-09-10 NOTE — Telephone Encounter (Signed)
I called him again, he called me but I was on the other line. No answer

## 2018-09-10 NOTE — Telephone Encounter (Signed)
Left message for him to call me back.  

## 2018-09-13 ENCOUNTER — Other Ambulatory Visit: Payer: Self-pay | Admitting: Nurse Practitioner

## 2018-09-13 NOTE — Telephone Encounter (Signed)
Unable to reach him

## 2018-09-15 ENCOUNTER — Ambulatory Visit (INDEPENDENT_AMBULATORY_CARE_PROVIDER_SITE_OTHER): Payer: Medicare HMO | Admitting: *Deleted

## 2018-09-15 ENCOUNTER — Other Ambulatory Visit: Payer: Self-pay

## 2018-09-15 VITALS — Wt 224.2 lb

## 2018-09-15 DIAGNOSIS — J4540 Moderate persistent asthma, uncomplicated: Secondary | ICD-10-CM | POA: Diagnosis not present

## 2018-09-16 ENCOUNTER — Ambulatory Visit: Payer: Medicare HMO | Admitting: Nutrition

## 2018-09-24 ENCOUNTER — Telehealth: Payer: Self-pay | Admitting: Orthopedic Surgery

## 2018-09-24 NOTE — Telephone Encounter (Signed)
10:30 AM Monday

## 2018-09-24 NOTE — Telephone Encounter (Signed)
Kevin Frank called and left message stating that he needed fluid drawn off his knee.  When would you be able to see him?

## 2018-09-27 ENCOUNTER — Ambulatory Visit: Payer: Medicare HMO | Admitting: Orthopedic Surgery

## 2018-09-29 ENCOUNTER — Ambulatory Visit: Payer: Medicare HMO

## 2018-10-08 ENCOUNTER — Ambulatory Visit: Payer: Medicare HMO | Admitting: Allergy & Immunology

## 2018-10-08 ENCOUNTER — Ambulatory Visit (INDEPENDENT_AMBULATORY_CARE_PROVIDER_SITE_OTHER): Payer: Medicare HMO

## 2018-10-08 ENCOUNTER — Other Ambulatory Visit: Payer: Self-pay

## 2018-10-08 DIAGNOSIS — J4540 Moderate persistent asthma, uncomplicated: Secondary | ICD-10-CM | POA: Diagnosis not present

## 2018-10-19 DIAGNOSIS — G4733 Obstructive sleep apnea (adult) (pediatric): Secondary | ICD-10-CM | POA: Diagnosis not present

## 2018-10-22 ENCOUNTER — Ambulatory Visit (INDEPENDENT_AMBULATORY_CARE_PROVIDER_SITE_OTHER): Payer: Medicare HMO

## 2018-10-22 ENCOUNTER — Other Ambulatory Visit: Payer: Self-pay

## 2018-10-22 DIAGNOSIS — J4540 Moderate persistent asthma, uncomplicated: Secondary | ICD-10-CM | POA: Diagnosis not present

## 2018-10-29 ENCOUNTER — Encounter: Payer: Self-pay | Admitting: Allergy & Immunology

## 2018-10-29 ENCOUNTER — Ambulatory Visit (INDEPENDENT_AMBULATORY_CARE_PROVIDER_SITE_OTHER): Payer: Medicare HMO | Admitting: Allergy & Immunology

## 2018-10-29 ENCOUNTER — Other Ambulatory Visit: Payer: Self-pay

## 2018-10-29 VITALS — BP 112/68 | HR 66 | Temp 95.8°F | Resp 20

## 2018-10-29 DIAGNOSIS — J3089 Other allergic rhinitis: Secondary | ICD-10-CM

## 2018-10-29 DIAGNOSIS — J449 Chronic obstructive pulmonary disease, unspecified: Secondary | ICD-10-CM

## 2018-10-29 DIAGNOSIS — J302 Other seasonal allergic rhinitis: Secondary | ICD-10-CM

## 2018-10-29 DIAGNOSIS — F17200 Nicotine dependence, unspecified, uncomplicated: Secondary | ICD-10-CM

## 2018-10-29 NOTE — Progress Notes (Signed)
FOLLOW UP  Date of Service/Encounter:  10/29/18   Assessment:   Asthma-COPD overlap syndrome - improved on Xolair every 14 days  Seasonal and perennial allergic rhinitis (cat, dog, cockroach, trees, weeds, and ragweed)  Current smoker  Complex medical history, including a recently diagnosed lung cancer (detected early, treated with radiation alone)  Plan/Recommendations:   1. Asthma-COPD overlap syndrome - You are doing GREAT!  - We will not make any medication changes at this time.  - Daily controller medication(s): Trelegy 100/62.5/25 one puff once daily + Xolair every two weeks - Prior to physical activity: ProAir 2 puffs 10-15 minutes before physical activity. - Rescue medications: ProAir 4 puffs every 4-6 hours as needed or albuterol nebulizer one vial every 4-6 hours as needed - Asthma control goals:  * Full participation in all desired activities (may need albuterol before activity) * Albuterol use two time or less a week on average (not counting use with activity) * Cough interfering with sleep two time or less a month * Oral steroids no more than once a year * No hospitalizations - Good luck with the biopsy this week (I am cautiously optimistic).   2. Chronic rhinitis (cat, dog, cockroach, trees, weeds, and ragweed) - Continue with nasal saline rinses 1-2 times daily to try to heal your nose and prevent more bleeds. - Use an antihistamine as needed (Allegra, Zyrtec, Claritin).   3. Return in about 6 months (around 05/01/2019).    Subjective:   Kevin Frank is a 78 y.o. male presenting today for follow up of  Chief Complaint  Patient presents with  . Asthma    Kevin Frank has a history of the following: Patient Active Problem List   Diagnosis Date Noted  . Squamous cell carcinoma of right lung ([place]) 03/29/2018  . Seasonal and perennial allergic rhinitis 09/22/2017  . COPD with asthma ([place]) 09/22/2017  . Obstructive sleep apnea on CPAP  04/23/2017  . Chronic knee pain 04/23/2017  . AKI (acute kidney injury) ([place]) 04/23/2017  . Chronic diastolic CHF (congestive heart failure) ([place]) 04/23/2017  . Cellulitis 04/23/2017  . Cellulitis of left lower leg   . Smoking greater than 40 pack years 03/05/2016  . Incidental lung nodule, > 69mm and < 56mm 03/05/2016  . Stage 2 moderate COPD by GOLD classification ([place]) 03/05/2016  . AAA (abdominal aortic aneurysm) without rupture ([place]) 06/14/2015  . OBSTRUCTIVE SLEEP APNEA 06/09/2007  . HYPERCHOLESTEROLEMIA 05/06/2007  . GOUT 05/06/2007  . DEPRESSION 05/06/2007  . Hypertension, essential 05/06/2007  . ABDOMINAL AORTIC ANEURYSM 05/06/2007  . BENIGN PROSTATIC HYPERTROPHY, HX OF 05/06/2007    History obtained from: chart review and patient.  Kevin Frank is a 78 y.o. male presenting for a follow up visit.  He was last seen in the office in February 2020.  At that time, he was already on levofloxacin.  We recommended continuing that for another 10 days.  We also added on Pulmicort twice daily and albuterol twice daily to help get him through the episode of pneumonia.  We continued him on Trelegy 1 puff once daily and Xolair every 2 weeks.  For his rhinitis, we continued needed and nasal saline rinses.  His last visit, he has done really well.  He did miss Xolair injection in the middle of April, but it did not seem to affect his symptoms at all.  He remains on the Trelegy 1 puff once daily.  He is continuing to smoke despite his recent diagnosis and treatment  of lung cancer.  He has not been using his rescue inhaler much at all.  Despite the coronavirus pandemic, he has remained very active outdoors.  Currently working on Tax inspector at [employer]  He is hoping to start having parties there again once the pandemic subsides.  He is having some problems with swelling of his knees. He has a history of gout and thinks that the swelling is from this. He reports that his symptoms flare from  exposure to shellfish as well as chicken livers, both of which he loves.   Otherwise, there have been no changes to his past medical history, surgical history, family history, or social history.    Review of Systems  Constitutional: Negative.  Negative for chills, fever, malaise/fatigue and weight loss.  HENT: Negative.  Negative for congestion, ear discharge, ear pain and sore throat.   Eyes: Negative for pain, discharge and redness.  Respiratory: Negative for cough, sputum production, shortness of breath and wheezing.   Cardiovascular: Negative.  Negative for chest pain and palpitations.  Gastrointestinal: Negative for abdominal pain, heartburn, nausea and vomiting.  Skin: Negative.  Negative for itching and rash.  Neurological: Negative for dizziness and headaches.  Endo/Heme/Allergies: Negative for environmental allergies. Does not bruise/bleed easily.       Objective:   Blood pressure 112/68, pulse 66, temperature (!) 95.8 F (35.4 C), temperature source Tympanic, resp. rate 20, SpO2 94 %. There is no height or weight on file to calculate BMI.   Physical Exam:  Physical Exam  Constitutional: He appears well-developed.  Pleasant boisterous male.  HENT:  Head: Normocephalic and atraumatic.  Right Ear: Tympanic membrane, external ear and ear canal normal.  Left Ear: Tympanic membrane, external ear and ear canal normal.  Nose: Mucosal edema and rhinorrhea present. No nasal deformity or septal deviation. No epistaxis. Right sinus exhibits no maxillary sinus tenderness and no frontal sinus tenderness. Left sinus exhibits no maxillary sinus tenderness and no frontal sinus tenderness.  Mouth/Throat: Uvula is midline and oropharynx is clear and moist. Mucous membranes are not pale and not dry.  Cerumen bilaterally.  Eyes: Pupils are equal, round, and reactive to light. Conjunctivae and EOM are normal. Right eye exhibits no chemosis and no discharge. Left eye exhibits no chemosis  and no discharge. Right conjunctiva is not injected. Left conjunctiva is not injected.  Cardiovascular: Normal rate, regular rhythm and normal heart sounds.  Respiratory: Effort normal and breath sounds normal. No accessory muscle usage. No tachypnea. No respiratory distress. He has no wheezes. He has no rhonchi. He has no rales. He exhibits no tenderness.  Moving air well in all lung fields.  Lymphadenopathy:    He has no cervical adenopathy.  Neurological: He is alert.  Skin: No abrasion, no petechiae and no rash noted. Rash is not papular, not vesicular and not urticarial. No erythema. No pallor.  Psychiatric: He has a normal mood and affect.     Diagnostic studies: none      Salvatore Marvel, MD  [facility]

## 2018-10-29 NOTE — Patient Instructions (Addendum)
1. Asthma-COPD overlap syndrome - You are doing GREAT!  - We will not make any medication changes at this time.  - Daily controller medication(s): Trelegy 100/62.5/25 one puff once daily + Xolair every two weeks - Prior to physical activity: ProAir 2 puffs 10-15 minutes before physical activity. - Rescue medications: ProAir 4 puffs every 4-6 hours as needed or albuterol nebulizer one vial every 4-6 hours as needed - Asthma control goals:  * Full participation in all desired activities (may need albuterol before activity) * Albuterol use two time or less a week on average (not counting use with activity) * Cough interfering with sleep two time or less a month * Oral steroids no more than once a year * No hospitalizations - Good luck with the biopsy this week (I am cautiously optimistic).   2. Chronic rhinitis (cat, dog, cockroach, trees, weeds, and ragweed) - Continue with nasal saline rinses 1-2 times daily to try to heal your nose and prevent more bleeds. - Use an antihistamine as needed (Allegra, Zyrtec, Claritin).   3. Return in about 6 months (around 05/01/2019).   Please inform us of any Emergency Department visits, hospitalizations, or changes in symptoms. Call us before going to the ED for breathing or allergy symptoms since we might be able to fit you in for a sick visit. Feel free to contact us anytime with any questions, problems, or concerns.  It was a pleasure to see you again today!   Websites that have reliable patient information: 1. American Academy of Asthma, Allergy, and Immunology: www.aaaai.org 2. Food Allergy Research and Education (FARE): foodallergy.org 3. Mothers of Asthmatics: http://www.asthmacommunitynetwork.org 4. American College of Allergy, Asthma, and Immunology: www.acaai.org

## 2018-11-05 ENCOUNTER — Ambulatory Visit (INDEPENDENT_AMBULATORY_CARE_PROVIDER_SITE_OTHER): Payer: Medicare HMO

## 2018-11-05 ENCOUNTER — Other Ambulatory Visit: Payer: Self-pay

## 2018-11-05 DIAGNOSIS — J4540 Moderate persistent asthma, uncomplicated: Secondary | ICD-10-CM

## 2018-11-08 ENCOUNTER — Other Ambulatory Visit: Payer: Self-pay | Admitting: *Deleted

## 2018-11-08 MED ORDER — OMALIZUMAB 150 MG ~~LOC~~ SOLR: 375.0000 mg | SUBCUTANEOUS | 11 refills | Status: DC

## 2018-11-12 DIAGNOSIS — H43391 Other vitreous opacities, right eye: Secondary | ICD-10-CM | POA: Diagnosis not present

## 2018-11-17 ENCOUNTER — Ambulatory Visit (HOSPITAL_COMMUNITY)
Admission: RE | Admit: 2018-11-17 | Discharge: 2018-11-17 | Disposition: A | Discharge status: Home / Self Care | Payer: Medicare HMO | Source: Ambulatory Visit | Attending: Nurse Practitioner | Admitting: Nurse Practitioner

## 2018-11-17 ENCOUNTER — Other Ambulatory Visit: Payer: Self-pay

## 2018-11-17 ENCOUNTER — Inpatient Hospital Stay (HOSPITAL_COMMUNITY): Payer: Medicare HMO | Attending: Hematology

## 2018-11-17 DIAGNOSIS — C3411 Malignant neoplasm of upper lobe, right bronchus or lung: Secondary | ICD-10-CM | POA: Insufficient documentation

## 2018-11-17 DIAGNOSIS — C3491 Malignant neoplasm of unspecified part of right bronchus or lung: Secondary | ICD-10-CM | POA: Insufficient documentation

## 2018-11-17 DIAGNOSIS — I700 Atherosclerosis of aorta: Secondary | ICD-10-CM | POA: Diagnosis not present

## 2018-11-17 DIAGNOSIS — R911 Solitary pulmonary nodule: Secondary | ICD-10-CM | POA: Diagnosis not present

## 2018-11-17 DIAGNOSIS — J439 Emphysema, unspecified: Secondary | ICD-10-CM | POA: Diagnosis not present

## 2018-11-17 LAB — COMPREHENSIVE METABOLIC PANEL
ALT: 23 U/L (ref 0–44)
AST: 20 U/L (ref 15–41)
Albumin: 4 g/dL (ref 3.5–5.0)
Alkaline Phosphatase: 92 U/L (ref 38–126)
Anion gap: 14 (ref 5–15)
BUN: 18 mg/dL (ref 8–23)
CO2: 25 mmol/L (ref 22–32)
Calcium: 9.2 mg/dL (ref 8.9–10.3)
Chloride: 101 mmol/L (ref 98–111)
Creatinine, Ser: 1.12 mg/dL (ref 0.61–1.24)
GFR calc Af Amer: >60 mL/min (ref >60)
GFR calc non Af Amer: >60 mL/min (ref >60)
Glucose, Bld: 106 mg/dL — ABNORMAL HIGH (ref 70–99)
Potassium: 4.1 mmol/L (ref 3.5–5.1)
Sodium: 140 mmol/L (ref 135–145)
Total Bilirubin: 0.7 mg/dL (ref 0.3–1.2)
Total Protein: 7.4 g/dL (ref 6.5–8.1)

## 2018-11-17 LAB — POCT I-STAT CREATININE: Creatinine, Ser: 1 mg/dL (ref 0.61–1.24)

## 2018-11-17 LAB — CBC WITH DIFFERENTIAL/PLATELET
Abs Immature Granulocytes: 0.04 10*3/uL (ref 0.00–0.07)
Basophils Absolute: 0.1 10*3/uL (ref 0.0–0.1)
Basophils Relative: 1 %
Eosinophils Absolute: 0.2 10*3/uL (ref 0.0–0.5)
Eosinophils Relative: 3 %
HCT: 47.9 % (ref 39.0–52.0)
Hemoglobin: 16 g/dL (ref 13.0–17.0)
Immature Granulocytes: 1 %
Lymphocytes Relative: 24 %
Lymphs Abs: 2 10*3/uL (ref 0.7–4.0)
MCH: 33.3 pg (ref 26.0–34.0)
MCHC: 33.4 g/dL (ref 30.0–36.0)
MCV: 99.6 fL (ref 80.0–100.0)
Monocytes Absolute: 0.8 10*3/uL (ref 0.1–1.0)
Monocytes Relative: 10 %
Neutro Abs: 5 10*3/uL (ref 1.7–7.7)
Neutrophils Relative %: 61 %
Platelets: 159 10*3/uL (ref 150–400)
RBC: 4.81 MIL/uL (ref 4.22–5.81)
RDW: 15.3 % (ref 11.5–15.5)
WBC: 8.1 10*3/uL (ref 4.0–10.5)
nRBC: 0 % (ref 0.0–0.2)

## 2018-11-17 LAB — LACTATE DEHYDROGENASE: LDH: 110 U/L (ref 98–192)

## 2018-11-17 MED ORDER — IOHEXOL 300 MG/ML  SOLN
75.0000 mL | Freq: Once | INTRAMUSCULAR | Status: AC | PRN
  Administered 2018-11-17: 11:00:00 75 mL via INTRAVENOUS

## 2018-11-18 ENCOUNTER — Telehealth: Payer: Self-pay | Admitting: *Deleted

## 2018-11-18 NOTE — Telephone Encounter (Signed)
Virtual Visit Pre-Appointment Phone Call  "(Name), I am calling you today to discuss your upcoming appointment. We are currently trying to limit exposure to the virus that causes COVID-19 by seeing patients at home rather than in the office."  1. "What is the BEST phone number to call the day of the visit?" - include this in appointment notes  2. "Do you have or have access to (through a family member/friend) a smartphone with video capability that we can use for your visit?" a. If yes - list this number in appt notes as "cell" (if different from BEST phone #) and list the appointment type as a VIDEO visit in appointment notes b. If no - list the appointment type as a PHONE visit in appointment notes  3. Confirm consent - "In the setting of the current Covid19 crisis, you are scheduled for a (phone or video) visit with your provider on (Monday, June 1) at (9:00 am ).  Just as we do with many in-office visits, in order for you to participate in this visit, we must obtain consent.  If you'd like, I can send this to your mychart (if signed up) or email for you to review.  Otherwise, I can obtain your verbal consent now.  All virtual visits are billed to your insurance company just like a normal visit would be.  By agreeing to a virtual visit, we'd like you to understand that the technology does not allow for your provider to perform an examination, and thus may limit your provider's ability to fully assess your condition. If your provider identifies any concerns that need to be evaluated in person, we will make arrangements to do so.  Finally, though the technology is pretty good, we cannot assure that it will always work on either your or our end, and in the setting of a video visit, we may have to convert it to a phone-only visit.  In either situation, we cannot ensure that we have a secure connection.  Are you willing to proceed?" STAFF: Did the patient verbally acknowledge consent to telehealth  visit? Document YES/NO here: YES  4. Advise patient to be prepared - "Two hours prior to your appointment, go ahead and check your blood pressure, pulse, oxygen saturation, and your weight (if you have the equipment to check those) and write them all down. When your visit starts, your provider will ask you for this information. If you have an Apple Watch or Kardia device, please plan to have heart rate information ready on the day of your appointment. Please have a pen and paper handy nearby the day of the visit as well."  5. Give patient instructions for MyChart download to smartphone OR Doximity/Doxy.me as below if video visit (depending on what platform provider is using)  6. Inform patient they will receive a phone call 15 minutes prior to their appointment time (may be from unknown caller ID) so they should be prepared to answer    TELEPHONE CALL NOTE  SEYON Tierce has been deemed a candidate for a follow-up tele-health visit to limit community exposure during the Covid-19 pandemic. I spoke with the patient via phone to ensure availability of phone/video source, confirm preferred email & phone number, and discuss instructions and expectations.  I reminded ZYIERE ROSEMOND to be prepared with any vital sign and/or heart rhythm information that could potentially be obtained via home monitoring, at the time of his visit. I reminded KAILIN LEU to expect a  phone call prior to his visit.  Desjuan Stearns Avanell Shackleton 11/18/2018 12:25 PM   INSTRUCTIONS FOR DOWNLOADING THE MYCHART APP TO SMARTPHONE  - The patient must first make sure to have activated MyChart and know their login information - If Apple, go to [employer] and type in MyChart in the search bar and download the app. If Android, ask patient to go to [employer] and type in [place] in the search bar and download the app. The app is free but as with any other app downloads, their phone may require them to verify saved payment  information or Apple/Android password.  - The patient will need to then log into the app with their MyChart username and password, and select [facility] as their healthcare provider to link the account. When it is time for your visit, go to the MyChart app, find appointments, and click Begin Video Visit. Be sure to Select Allow for your device to access the Microphone and Camera for your visit. You will then be connected, and your provider will be with you shortly.  **If they have any issues connecting, or need assistance please contact MyChart service desk [phone])**  **If using a computer, in order to ensure the best quality for their visit they will need to use either of the following Internet Browsers: [employer], or Google Chrome**  IF USING DOXIMITY or DOXY.ME - The patient will receive a link just prior to their visit by text.     FULL LENGTH CONSENT FOR TELE-HEALTH VISIT   I hereby voluntarily request, consent and authorize [facility] and its employed or contracted physicians, physician assistants, nurse practitioners or other licensed health care professionals (the Practitioner), to provide me with telemedicine health care services (the "Services") as deemed necessary by the treating Practitioner. I acknowledge and consent to receive the Services by the Practitioner via telemedicine. I understand that the telemedicine visit will involve communicating with the Practitioner through live audiovisual communication technology and the disclosure of certain medical information by electronic transmission. I acknowledge that I have been given the opportunity to request an in-person assessment or other available alternative prior to the telemedicine visit and am voluntarily participating in the telemedicine visit.  I understand that I have the right to withhold or withdraw my consent to the use of telemedicine in the course of my care at any time, without affecting my right  to future care or treatment, and that the Practitioner or I may terminate the telemedicine visit at any time. I understand that I have the right to inspect all information obtained and/or recorded in the course of the telemedicine visit and may receive copies of available information for a reasonable fee.  I understand that some of the potential risks of receiving the Services via telemedicine include:  [name] Delay or interruption in medical evaluation due to technological equipment failure or disruption; . Information transmitted may not be sufficient (e.g. poor resolution of images) to allow for appropriate medical decision making by the Practitioner; and/or  . In rare instances, security protocols could fail, causing a breach of personal health information.  Furthermore, I acknowledge that it is my responsibility to provide information about my medical history, conditions and care that is complete and accurate to the best of my ability. I acknowledge that Practitioner's advice, recommendations, and/or decision may be based on factors not within their control, such as incomplete or inaccurate data provided by me or distortions of diagnostic images or specimens that may result  from electronic transmissions. I understand that the practice of medicine is not an exact science and that Practitioner makes no warranties or guarantees regarding treatment outcomes. I acknowledge that I will receive a copy of this consent concurrently upon execution via email to the email address I last provided but may also request a printed copy by calling the office of [facility].    I understand that my insurance will be billed for this visit.   I have read or had this consent read to me. . I understand the contents of this consent, which adequately explains the benefits and risks of the Services being provided via telemedicine.  . I have been provided ample opportunity to ask questions regarding this consent and the Services  and have had my questions answered to my satisfaction. . I give my informed consent for the services to be provided through the use of telemedicine in my medical care  By participating in this telemedicine visit I agree to the above.

## 2018-11-19 ENCOUNTER — Other Ambulatory Visit: Payer: Self-pay

## 2018-11-19 ENCOUNTER — Ambulatory Visit (INDEPENDENT_AMBULATORY_CARE_PROVIDER_SITE_OTHER): Payer: Medicare HMO

## 2018-11-19 DIAGNOSIS — J4540 Moderate persistent asthma, uncomplicated: Secondary | ICD-10-CM | POA: Diagnosis not present

## 2018-11-20 NOTE — Progress Notes (Signed)
Telehealth Visit     Virtual Visit via Video Note   This visit type was conducted due to national recommendations for restrictions regarding the COVID-19 Pandemic (e.g. social distancing) in an effort to limit this patient's exposure and mitigate transmission in our community.  Due to his co-morbid illnesses, this patient is at least at moderate risk for complications without adequate follow up.  This format is felt to be most appropriate for this patient at this time.  All issues noted in this document were discussed and addressed.  A limited physical exam was performed with this format.  Please refer to the patient's chart for his consent to telehealth for [facility].   Evaluation Performed:  Follow-up visit  This visit type was conducted due to national recommendations for restrictions regarding the COVID-19 Pandemic (e.g. social distancing).  This format is felt to be most appropriate for this patient at this time.  All issues noted in this document were discussed and addressed.  No physical exam was performed (except for noted visual exam findings with Video Visits).  Please refer to the patient's chart (MyChart message for video visits and phone note for telephone visits) for the patient's consent to telehealth for [facility].  Date:  11/22/2018   ID:  Kevin Frank, DOB 24-Dec-1940, MRN [id number]  Patient Location:  Home  Provider location:   Home  PCP:  Sinda Du, MD  Cardiologist:  Jerel Shepherd Electrophysiologist:  None   Chief Complaint:  Follow up  History of Present Illness:    Kevin Frank is a 78 y.o. male who presents via audio/video conferencing for a telehealth visit today.  Seen for Dr. Burt Knack. He is a former patient of Dr. Susa Simmonds. Primarily follows with me.   He has a history of hypertension, hyperlipidemia, DM, tobacco abuse, COPD, and abdominal aortic aneurysm.On 04/09/2016 he underwent FEVARwith a single rightrenal fenestration by  Dr. Trula Slade. He also had left renal angioplasty at the time of the main body deployment service to ensure that the graft not cover the renal artery because of angulation. The day following his procedure, his creatinine increased and ultrasound did not show good perfusion to the left kidney and therefore he went to the Cath Lab and had stenting of his left renal artery. His creatinine trended down and his urine output improved. The patient also had issues with left leg numbness in his legs after the procedure. Neurology was consulted. It was felt this was most likely secondary to lower back disease that was aggravated during surgery.  He continues to smoke cigarettes. He has not had any known CADsymptoms but has had prior CT scan showing left main and 3VD.He has been on chronic Plavix.He has had lung nodules noted as well.He had progressive shortness of breath last year - found to have lung cancer - has had XRT - felt to be poor candidate for surgical intervention. He has continued to smoke. Echo was updated and showed worsening pulmonary HTN. He was last seen in December - had some fatigue, breathing was "ok".   The patient does not have symptoms concerning for COVID-19 infection (fever, chills, cough, or new shortness of breath).   Seen today by a telephone conversation. He has consented for this visit. He has declined My Chart activation. He does not have a smart phone. No Internet. Has no real concerns today. Says Dr. Luan Pulling is retiring in September. He feels like he is doing ok. Had labs last week - had CT  of the chest last week - these were reviewed with him. Right lung nodule similar to minimally decreased. No chest pain. Medicines are going ok. Not dizzy. BP looks ok. He feels like he is doing ok.   Past Medical History:  Diagnosis Date   AAA (abdominal aortic aneurysm) ([place]) 01/2008   3.2cm   Asthma    BPH (benign prostatic hyperplasia)    Chronic knee pain    bilateral    COPD (chronic obstructive pulmonary disease) ([facility])    Diabetes mellitus    type II   Gout    History of kidney stones    Hypercholesterolemia    Hyperlipidemia    Hypertension    Normal nuclear stress test 2011   Obesity    Obstructive sleep apnea on CPAP    Presence of indwelling urinary catheter    Shortness of breath    due to cigarette abuse   Tobacco abuse    Past Surgical History:  Procedure Laterality Date   ABDOMINAL AORTIC ENDOVASCULAR FENESTRATED STENT GRAFT N/A 04/09/2016   Procedure: ABDOMINAL AORTIC ENDOVASCULAR FENESTRATED STENT GRAFT with left brachial artery access using ultrasound guidance;  Surgeon: Serafina Mitchell, MD;  Location: [facility];  Service: Vascular;  Laterality: N/A;   [place] N/A 04/10/2016   Procedure: Renal Angiography;  Surgeon: Angelia Mould, MD;  Location: [facility];  Service: Cardiovascular;  Laterality: N/A;   TRANSURETHRAL RESECTION OF PROSTATE  01/07/2011   Procedure: TRANSURETHRAL RESECTION OF THE PROSTATE (TURP);  Surgeon: Marissa Nestle;  Location: [facility];  Service: Urology;  Laterality: N/A;     Current Meds  Medication Sig   albuterol (PROVENTIL HFA;VENTOLIN HFA) 108 (90 BASE) MCG/ACT inhaler Inhale 2 puffs into the lungs every 6 (six) hours as needed (rescue inhaler).    allopurinol (ZYLOPRIM) 300 MG tablet Take 300 mg by mouth daily.     amLODipine (NORVASC) 10 MG tablet TAKE 1 TABLET EVERY DAY   atorvastatin (LIPITOR) 20 MG tablet Take 20 mg by mouth daily.    azelastine (ASTELIN) 0.1 % nasal spray Place 1 spray into both nostrils 2 (two) times daily. Use in each nostril as directed   clopidogrel (PLAVIX) 75 MG tablet TAKE (1) TABLET BY MOUTH ONCE DAILY.   hydrALAZINE (APRESOLINE) 50 MG tablet TAKE (1) TABLET TWICE DAILY.   losartan (COZAAR) 100 MG tablet TAKE 1 TABLET (100 MG TOTAL) BY MOUTH DAILY.   metFORMIN (GLUCOPHAGE) 500 MG  tablet Take 1,000 mg by mouth 2 (two) times daily with a meal.    metoprolol succinate (TOPROL-XL) 25 MG 24 hr tablet TAKE 1 TABLET EVERY DAY   omalizumab (XOLAIR) 150 MG injection Inject 375 mg into the skin every 14 (fourteen) days.   sertraline (ZOLOFT) 50 MG tablet Take 50 mg by mouth daily.     torsemide (DEMADEX) 20 MG tablet Take 40 mg by mouth daily.    TRELEGY ELLIPTA 100-62.5-25 MCG/INH AEPB INHALE 1 PUFF INTO THE LUNGS DAILY--RINSE, GARGLE AND SPIT AFTER USE.   Current Facility-Administered Medications for the 11/22/18 encounter (Telemedicine) with Burtis Junes, NP  Medication   ipratropium (ATROVENT) nebulizer solution 0.5 mg   levalbuterol (XOPENEX) nebulizer solution 1.25 mg   omalizumab Arvid Right) injection 375 mg     Allergies:   No known allergies   Social History   Tobacco Use   Smoking status: Current Every Day Smoker    Packs/day: 3.00  Years: 50.00    Pack years: 150.00    Types: Cigarettes   Smokeless tobacco: Never Used   Tobacco comment: 2ppd 10/30/2016 ee  Substance Use Topics   Alcohol use: Yes    Alcohol/week: 0.0 standard drinks    Comment: very little   Drug use: No     Family Hx: The patient's family history includes Asthma in his daughter; Cancer in his mother; Diabetes in his mother; Hypertension in his mother; Liver disease in his father; Lymphoma in his mother; Mental retardation in an other family member. There is no history of Allergic rhinitis, Angioedema, Atopy, Eczema, or Immunodeficiency.  ROS:   Please see the history of present illness.   All other systems reviewed are negative except for fatigue.    Objective:    Vital Signs:  BP 133/71    Pulse (!) 54    Ht 5\' 10"  (1.778 m)    BMI 32.17 kg/m    Wt Readings from Last 3 Encounters:  09/15/18 224 lb 3.2 oz (101.7 kg)  09/08/18 229 lb (103.9 kg)  08/17/18 229 lb 8 oz (104.1 kg)    Alert male in no acute distress. Does not sound short of breath with conversation.     Labs/Other Tests and Data Reviewed:    Lab Results  Component Value Date   WBC 8.1 11/17/2018   HGB 16.0 11/17/2018   HCT 47.9 11/17/2018   PLT 159 11/17/2018   GLUCOSE 106 (H) 11/17/2018   CHOL 138 12/16/2010   TRIG 53.0 12/16/2010   HDL 46.10 12/16/2010   LDLCALC 81 12/16/2010   ALT 23 11/17/2018   AST 20 11/17/2018   NA 140 11/17/2018   K 4.1 11/17/2018   CL 101 11/17/2018   CREATININE 1.12 11/17/2018   BUN 18 11/17/2018   CO2 25 11/17/2018   INR 0.96 03/19/2018   HGBA1C 6.0 (H) 04/02/2016     BNP (last 3 results) No results for input(s): BNP in the last 8760 hours.  ProBNP (last 3 results) Recent Labs    01/26/18 0919  PROBNP 161    Prior CV studies:    The following studies were reviewed today:  CT CHEST IMPRESSION 11/17/2018: 1. Stable exam. Similar to minimally decreased size of right upper lobe pulmonary nodule. 2. Aortic Atherosclerosis (ICD10-I70.0) and Emphysema (ICD10-J43.9). 3. Coronary artery calcifications.   Electronically Signed   By: Kerby Moors M.D.   On: 11/17/2018 13:14   EchoStudy Conclusions8/2019  - Left ventricle: The cavity size was normal. There was mild concentric hypertrophy. Systolic function was normal. The estimated ejection fraction was in the range of 55% to 60%. Wall motion was normal; there were no regional wall motion abnormalities. Features are consistent with a pseudonormal left ventricular filling pattern, with concomitant abnormal relaxation and increased filling pressure (grade 2 diastolic dysfunction). - Aortic valve: Sclerosis without stenosis. There was no regurgitation. - Mitral valve: There was trivial regurgitation. - Left atrium: The atrium was mildly dilated. - Right ventricle: The cavity size was moderately dilated. Wall thickness was normal. Systolic function was normal. RV systolic pressure (S, est): 53 mm Hg. - Atrial septum: No defect or patent foramen ovale was  identified. - Tricuspid valve: There was trivial regurgitation. - Pulmonic valve: There was no significant regurgitation. - Pulmonary arteries: Systolic pressure was moderately to severely increased. PA peak pressure: 53 mm Hg (S).  Impressions:  - Normal LV systolic function with grade II diastolic dysfunction. Elevated right sided filling pressures, consistent with  pulmonary hypertension.  PETIMPRESSION9/2019: 1. The 2.4 by 0.8 cm apical segment right upper lobe nodule has persisted over the last month and has a maximum SUV of 2.9, highly suspicious for malignancy. No adenopathy or metastatic spread is identified. 2. Other imaging findings of potential clinical significance: Aortic Atherosclerosis (ICD10-I70.0) and Emphysema (ICD10-J43.9). Coronary atherosclerosis. Airway thickening is present, suggesting bronchitis or reactive airways disease. Chronic bilateral maxillary sinusitis. Mild prostatomegaly. Aorta bi-iliac stent graft. Sigmoid colon diverticulosis.   Electronically Signed By: Van Clines M.D. On: 03/09/2018 08:51   CTA CHESTIMPRESSION8/2019: 1. Somewhat irregular nodular opacity in the posterior segment of the right upper lobe measuring 2.0 x 1.0 x 0.9 cm. Small neoplasm must be of concern. Advise correlation with nuclear medicine PET study to further assess.  2. No demonstrable pulmonary embolus. No thoracic aortic aneurysm or dissection. There are foci of aortic atherosclerosis as well as coronary artery and great vessel calcification.  3. Underlying centrilobular emphysematous change. Atelectasis evident in both lower lobes and posterior segment left upper lobe. No frank consolidation noted.  4. No evident thoracic adenopathy.  5. Adrenal hypertrophy bilaterally, more notable on the left than on the right.  6. Absent gallbladder.  These results will be called to the ordering clinician or representative by the  Radiologist Assistant, and communication documented in the PACS or zVision Dashboard.  Aortic Atherosclerosis (ICD10-I70.0) and Emphysema (ICD10-J43.9).  Electronically Signed By: Lowella Grip III M.D. On: 02/04/2018 11:12   Myoview Study Highlights from 02/2016   Nuclear stress EF: 58%.  There was no ST segment deviation noted during stress.  The study is normal.  This is a low risk study.    Echo Study Conclusions from 02/2016  - Left ventricle: The cavity size was normal. Wall thickness was increased in a pattern of mild LVH. Systolic function was normal. The estimated ejection fraction was in the range of 60% to 65%. Wall motion was normal; there were no regional wall motion abnormalities. Features are consistent with a pseudonormal left ventricular filling pattern, with concomitant abnormal relaxation and increased filling pressure (grade 2 diastolic dysfunction). Doppler parameters are consistent with high ventricular filling pressure. - Mitral valve: There was mild regurgitation. - Left atrium: The atrium was moderately dilated. - Pulmonary arteries: Systolic pressure was mildly increased. PA peak pressure: 37 mm Hg (S).  Impressions:  - Normal LV systolic function; grade 2 diastolic dysfunction with elevated LV filling pressure; mild MR; moderate LAE; trace TR with mildly elevated pulmonary pressure.  CT ANGIO CHEST/ABD/PELVIS IMPRESSION: 1. Patent suprarenal bifurcated aortic stent graft without endoleak, native sac diameter 5.3 cm. 2. Coronary and Aortic Atherosclerosis (ICD10-170.0) without dissection. 3. Descending and sigmoid diverticulosis.   Electronically Signed By: Lucrezia Europe M.D. On: 05/16/2016 11:02   ASSESSMENT & PLAN:     1. HTN - BP is fine today - no changes made today.   2. 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 Felt to be too high risk for surgical intervention. Most  recent CT from last week noted.   3. HLD - on statin- his labs are checked by PCP  4. Long standing tobacco- still smoking - unfortunately, I still don't think he will ever stop.   5. Prior AAA/FEVAR repair with stenting of the left renal artery- he follows with [facility].Not discussed today.   6. Chronic diastolic HF- breathing is stable by his report - no swelling - he does not have scales. BP is ok. No changes made today.  7. Presumed CAD - noted on prior CTs - he has no active        symptoms. He has been on chronic Plavix. Favor conservative management.       8. COVID-19 Education: The signs and symptoms of COVID-19 were discussed with the patient and how to seek care for testing (follow up with PCP or arrange E-visit).  The importance of social distancing, staying at home, hand hygiene and wearing a mask when out in public were discussed today.  Patient Risk:   After full review of this patient's clinical status, I feel that they are at least moderate risk at this time.  Time:   Today, I have spent 8 minutes with the patient with telehealth technology discussing the above issues.     Medication Adjustments/Labs and Tests Ordered: Current medicines are reviewed at length with the patient today.  Concerns regarding medicines are outlined above.   Tests Ordered: No orders of the defined types were placed in this encounter.   Medication Changes: No orders of the defined types were placed in this encounter.   Disposition:  FU with me in the office in 4 months.    Patient is agreeable to this plan and will call if any problems develop in the interim.   Amie Critchley, NP  11/22/2018 9:04 AM    [place]

## 2018-11-22 ENCOUNTER — Telehealth (INDEPENDENT_AMBULATORY_CARE_PROVIDER_SITE_OTHER): Payer: Medicare HMO | Admitting: Nurse Practitioner

## 2018-11-22 ENCOUNTER — Other Ambulatory Visit: Payer: Self-pay

## 2018-11-22 ENCOUNTER — Encounter: Payer: Self-pay | Admitting: Nurse Practitioner

## 2018-11-22 VITALS — BP 133/71 | HR 54 | Ht 70.0 in

## 2018-11-22 DIAGNOSIS — C3490 Malignant neoplasm of unspecified part of unspecified bronchus or lung: Secondary | ICD-10-CM

## 2018-11-22 DIAGNOSIS — I10 Essential (primary) hypertension: Secondary | ICD-10-CM

## 2018-11-22 DIAGNOSIS — Z7189 Other specified counseling: Secondary | ICD-10-CM

## 2018-11-22 DIAGNOSIS — E7800 Pure hypercholesterolemia, unspecified: Secondary | ICD-10-CM

## 2018-11-22 DIAGNOSIS — I5032 Chronic diastolic (congestive) heart failure: Secondary | ICD-10-CM

## 2018-11-22 NOTE — Patient Instructions (Addendum)
After Visit Summary:  We will be checking the following labs today -  NONE  Your lab from last week looks ok.    Medication Instructions:    Continue with your current medicines.    If you need a refill on your cardiac medications before your next appointment, please call your pharmacy.     Testing/Procedures To Be Arranged:  N/A  Follow-Up:   See me in about 4 months in the office.     At [facility], you and your health needs are our priority.  As part of our continuing mission to provide you with exceptional heart care, we have created designated Provider Care Teams.  These Care Teams include your primary Cardiologist (physician) and Advanced Practice Providers (APPs -  Physician Assistants and Nurse Practitioners) who all work together to provide you with the care you need, when you need it.  Special Instructions:  . Stay safe, stay home, wash your hands for at least 20 seconds and wear a mask when out in public.  . It was good to talk with you today.    Call the [place] office at [phone] if you have any questions, problems or concerns.

## 2018-11-24 ENCOUNTER — Inpatient Hospital Stay (HOSPITAL_COMMUNITY): Payer: Medicare HMO | Attending: Hematology | Admitting: Hematology

## 2018-11-29 ENCOUNTER — Ambulatory Visit: Payer: Medicare HMO | Admitting: Nurse Practitioner

## 2018-11-30 ENCOUNTER — Encounter: Payer: Medicare HMO | Attending: Pulmonary Disease | Admitting: Nutrition

## 2018-11-30 ENCOUNTER — Other Ambulatory Visit: Payer: Self-pay

## 2018-11-30 ENCOUNTER — Encounter: Payer: Self-pay | Admitting: Nutrition

## 2018-11-30 DIAGNOSIS — E782 Mixed hyperlipidemia: Secondary | ICD-10-CM

## 2018-11-30 DIAGNOSIS — E118 Type 2 diabetes mellitus with unspecified complications: Secondary | ICD-10-CM | POA: Insufficient documentation

## 2018-11-30 DIAGNOSIS — IMO0002 Reserved for concepts with insufficient information to code with codable children: Secondary | ICD-10-CM

## 2018-11-30 DIAGNOSIS — E1165 Type 2 diabetes mellitus with hyperglycemia: Secondary | ICD-10-CM | POA: Insufficient documentation

## 2018-11-30 DIAGNOSIS — E669 Obesity, unspecified: Secondary | ICD-10-CM

## 2018-11-30 NOTE — Progress Notes (Signed)
Medical Nutrition Therapy:  Appt start time: 1400 end time:  1415  Assessment:  Primary concerns today: Diabetes Type 2. Telephone visit. He is HOH.  His wife does the cooking and shopping. Eats thee meals per day. Radiation x 3 on his lung November 2019.  Smoker. He notes is still eating some sweets and eating after supper. He does feel like he is eating more vegetables and drinking water BS this am 147 mg/dl. Doesn't check at night often. Still snacking at night but more fruit. Willing to work on eating better balanced meals, watch portions and cut out sweets and high carb processed foods.  Wife is supportive.  Making slow progress.but reluctant to give up his habits of eating of certain things; sauage biscuits , eating out and snacks at night.  CMP Latest Ref Rng & Units 11/17/2018 11/17/2018 07/27/2018  Glucose 70 - 99 mg/dL 106(H) - 157(H)  BUN 8 - 23 mg/dL 18 - 14  Creatinine 0.61 - 1.24 mg/dL 1.12 1.00 0.99  Sodium 135 - 145 mmol/L 140 - 133(L)  Potassium 3.5 - 5.1 mmol/L 4.1 - 3.3(L)  Chloride 98 - 111 mmol/L 101 - 99  CO2 22 - 32 mmol/L 25 - 23  Calcium 8.9 - 10.3 mg/dL 9.2 - 8.9  Total Protein 6.5 - 8.1 g/dL 7.4 - 7.2  Total Bilirubin 0.3 - 1.2 mg/dL 0.7 - 0.6  Alkaline Phos 38 - 126 U/L 92 - 68  AST 15 - 41 U/L 20 - 21  ALT 0 - 44 U/L 23 - 28   Lipid Panel     Component Value Date/Time   CHOL 138 12/16/2010 0854   TRIG 53.0 12/16/2010 0854   HDL 46.10 12/16/2010 0854   CHOLHDL 3 12/16/2010 0854   VLDL 10.6 12/16/2010 0854   LDLCALC 81 12/16/2010 0854      Preferred Learning Style:  No preference indicated   Learning Readiness:     Ready  Change in progress   MEDICATIONS:    DIETARY INTAKE:   24-hr recall:  B ( AM): sausage biscuit , coffee Snk ( AM):   L ( PM): Leftovers Snk ( PM):  D ( PM):Steak and sweet potato, water, tea Snk ( PM): occasionally cookies or fruit. Beverages: water, diet sodas  Usual physical activity: ADL due to breathing  issues.  Estimated energy needs: 1800 calories 200 g carbohydrates 135 g protein 50 g fat  Progress Towards Goal(s):  In progress.   Nutritional Diagnosis:  NB-1.1 Food and nutrition-related knowledge deficit As related to Diabetes.  As evidenced by A1C 11%.    Intervention:  Nutrition and Diabetes education provided on My Plate, CHO counting, meal planning, portion sizes, timing of meals, avoiding snacks between meals unless having a low blood sugar, target ranges for A1C and blood sugars, signs/symptoms and treatment of hyper/hypoglycemia, monitoring blood sugars, taking medications as prescribed, benefits of exercising 30 minutes per day and prevention of complications of DM. [name] Goals Follow My Plate Eat meals on time Increase fresh fruits and vegetables.  Cut out diet soda and drink water Cut out sweets and snacks after suppoer. Test blood sugars twice a day.  Get A1C down to 7%.   Teaching Method Utilized:  Visual Auditory Hands on  Handouts given during visit include:  The Plate Method  Meal Plan   Barriers to learning/adherence to lifestyle change: none  Demonstrated degree of understanding via:  Teach Back   Monitoring/Evaluation:  Dietary intake, exercise, meal planning, and body  weight in 3 months.[name]

## 2018-11-30 NOTE — Patient Instructions (Signed)
.   Goals Follow My Plate Eat meals on time Increase fresh fruits and vegetables.  Cut out diet soda and drink water Cut out sweets and snacks after suppoer. Test blood sugars twice a day.  Get A1C down to 7%.

## 2018-12-03 ENCOUNTER — Other Ambulatory Visit: Payer: Self-pay

## 2018-12-03 ENCOUNTER — Ambulatory Visit (INDEPENDENT_AMBULATORY_CARE_PROVIDER_SITE_OTHER): Payer: Medicare HMO

## 2018-12-03 DIAGNOSIS — J4540 Moderate persistent asthma, uncomplicated: Secondary | ICD-10-CM | POA: Diagnosis not present

## 2018-12-14 ENCOUNTER — Other Ambulatory Visit: Payer: Self-pay

## 2018-12-14 ENCOUNTER — Other Ambulatory Visit: Payer: Medicare HMO

## 2018-12-14 DIAGNOSIS — Z20822 Contact with and (suspected) exposure to covid-19: Secondary | ICD-10-CM

## 2018-12-14 DIAGNOSIS — R6889 Other general symptoms and signs: Secondary | ICD-10-CM | POA: Diagnosis not present

## 2018-12-15 ENCOUNTER — Other Ambulatory Visit: Payer: Self-pay | Admitting: Nurse Practitioner

## 2018-12-17 ENCOUNTER — Ambulatory Visit (INDEPENDENT_AMBULATORY_CARE_PROVIDER_SITE_OTHER): Payer: Medicare HMO

## 2018-12-17 ENCOUNTER — Other Ambulatory Visit: Payer: Self-pay

## 2018-12-17 DIAGNOSIS — J4540 Moderate persistent asthma, uncomplicated: Secondary | ICD-10-CM | POA: Diagnosis not present

## 2018-12-18 LAB — NOVEL CORONAVIRUS, NAA: SARS-CoV-2, NAA: NOT DETECTED

## 2018-12-22 ENCOUNTER — Telehealth: Payer: Self-pay | Admitting: General Practice

## 2018-12-22 NOTE — Telephone Encounter (Signed)
Pt called in to follow up on results. Made pt aware of his covid results.

## 2018-12-31 ENCOUNTER — Ambulatory Visit (INDEPENDENT_AMBULATORY_CARE_PROVIDER_SITE_OTHER): Payer: Medicare HMO | Admitting: Orthopedic Surgery

## 2018-12-31 ENCOUNTER — Ambulatory Visit: Payer: Self-pay

## 2018-12-31 ENCOUNTER — Other Ambulatory Visit: Payer: Self-pay

## 2018-12-31 ENCOUNTER — Encounter: Payer: Self-pay | Admitting: Orthopedic Surgery

## 2018-12-31 VITALS — BP 131/65 | HR 70 | Temp 97.4°F | Ht 70.0 in | Wt 226.0 lb

## 2018-12-31 DIAGNOSIS — M25461 Effusion, right knee: Secondary | ICD-10-CM

## 2018-12-31 NOTE — Patient Instructions (Addendum)
ICE 3 X A DAY FOR 3 DAYS   You have received an injection of steroids into the joint. 15% of patients will have increased pain within the 24 hours postinjection.   This is transient and will go away.   We recommend that you use ice packs on the injection site for 20 minutes every 2 hours and extra strength Tylenol 2 tablets every 8 as needed until the pain resolves.  If you continue to have pain after taking the Tylenol and using the ice please call the office for further instructions.    Steps to Quit Smoking Smoking tobacco is the leading cause of preventable death. It can affect almost every organ in the body. Smoking puts you and people around you at risk for many serious, long-lasting (chronic) diseases. Quitting smoking can be hard, but it is one of the best things that you can do for your health. It is never too late to quit. How do I get ready to quit? When you decide to quit smoking, make a plan to help you succeed. Before you quit:  Pick a date to quit. Set a date within the next 2 weeks to give you time to prepare.  Write down the reasons why you are quitting. Keep this list in places where you will see it often.  Tell your family, friends, and co-workers that you are quitting. Their support is important.  Talk with your doctor about the choices that may help you quit.  Find out if your health insurance will pay for these treatments.  Know the people, places, things, and activities that make you want to smoke (triggers). Avoid them. What first steps can I take to quit smoking?  Throw away all cigarettes at home, at work, and in your car.  Throw away the things that you use when you smoke, such as ashtrays and lighters.  Clean your car. Make sure to empty the ashtray.  Clean your home, including curtains and carpets. What can I do to help me quit smoking? Talk with your doctor about taking medicines and seeing a counselor at the same time. You are more likely to  succeed when you do both.  If you are pregnant or breastfeeding, talk with your doctor about counseling or other ways to quit smoking. Do not take medicine to help you quit smoking unless your doctor tells you to do so. To quit smoking: Quit right away  Quit smoking totally, instead of slowly cutting back on how much you smoke over a period of time.  Go to counseling. You are more likely to quit if you go to counseling sessions regularly. Take medicine You may take medicines to help you quit. Some medicines need a prescription, and some you can buy over-the-counter. Some medicines may contain a drug called nicotine to replace the nicotine in cigarettes. Medicines may:  Help you to stop having the desire to smoke (cravings).  Help to stop the problems that come when you stop smoking (withdrawal symptoms). Your doctor may ask you to use:  Nicotine patches, gum, or lozenges.  Nicotine inhalers or sprays.  Non-nicotine medicine that is taken by mouth. Find resources Find resources and other ways to help you quit smoking and remain smoke-free after you quit. These resources are most helpful when you use them often. They include:  Online chats with a Social worker.  Phone quitlines.  Printed Furniture conservator/restorer.  Support groups or group counseling.  Text messaging programs.  Mobile phone apps. Use apps on  your mobile phone or tablet that can help you stick to your quit plan. There are many free apps for mobile phones and tablets as well as websites. Examples include Quit Guide from the [employer] and smokefree.gov  What things can I do to make it easier to quit?   Talk to your family and friends. Ask them to support and encourage you.  Call a phone quitline (1-800-QUIT-NOW), reach out to support groups, or work with a Social worker.  Ask people who smoke to not smoke around you.  Avoid places that make you want to smoke, such as: ? Bars. ? Parties. ? Smoke-break areas at work.  Spend  time with people who do not smoke.  Lower the stress in your life. Stress can make you want to smoke. Try these things to help your stress: ? Getting regular exercise. ? Doing deep-breathing exercises. ? Doing yoga. ? Meditating. ? Doing a body scan. To do this, close your eyes, focus on one area of your body at a time from head to toe. Notice which parts of your body are tense. Try to relax the muscles in those areas. How will I feel when I quit smoking? Day 1 to 3 weeks Within the first 24 hours, you may start to have some problems that come from quitting tobacco. These problems are very bad 2-3 days after you quit, but they do not often last for more than 2-3 weeks. You may get these symptoms:  Mood swings.  Feeling restless, nervous, angry, or annoyed.  Trouble concentrating.  Dizziness.  Strong desire for high-sugar foods and nicotine.  Weight gain.  Trouble pooping (constipation).  Feeling like you may vomit (nausea).  Coughing or a sore throat.  Changes in how the medicines that you take for other issues work in your body.  Depression.  Trouble sleeping (insomnia). Week 3 and afterward After the first 2-3 weeks of quitting, you may start to notice more positive results, such as:  Better sense of smell and taste.  Less coughing and sore throat.  Slower heart rate.  Lower blood pressure.  Clearer skin.  Better breathing.  Fewer sick days. Quitting smoking can be hard. Do not give up if you fail the first time. Some people need to try a few times before they succeed. Do your best to stick to your quit plan, and talk with your doctor if you have any questions or concerns. Summary  Smoking tobacco is the leading cause of preventable death. Quitting smoking can be hard, but it is one of the best things that you can do for your health.  When you decide to quit smoking, make a plan to help you succeed.  Quit smoking right away, not slowly over a period of  time.  When you start quitting, seek help from your doctor, family, or friends. This information is not intended to replace advice given to you by your health care provider. Make sure you discuss any questions you have with your health care provider. Document Released: 04/05/2009 Document Revised: 08/27/2018 Document Reviewed: 08/28/2018 Elsevier Patient Education  2020 [employer].

## 2018-12-31 NOTE — Progress Notes (Signed)
Chief Complaint  Patient presents with  . Knee Pain    Right knee swelling and pain for 1 day.   REQUESTED ASPIRATION AND INJECTION   Procedure note injection and aspiration right knee joint  Verbal consent was obtained to aspirate and inject the right knee joint   Timeout was completed to confirm the site of aspiration and injection  An 18-gauge needle was used to aspirate the knee joint from a suprapatellar lateral approach.  The medications used were 40 mg of Depo-Medrol and 1% lidocaine 3 cc  Anesthesia was provided by ethyl chloride and the skin was prepped with alcohol.  After cleaning the skin with alcohol an 18-gauge needle was used to aspirate the right knee joint.  We obtained 17 cc of fluid CLEAR, YELLOW   We follow this by injection of 40 mg of Depo-Medrol and 3 cc 1% lidocaine.  There were no complications. A sterile bandage was applied.

## 2019-01-17 ENCOUNTER — Telehealth: Payer: Self-pay
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
## 2019-01-18 ENCOUNTER — Other Ambulatory Visit: Payer: Self-pay

## 2019-01-18 ENCOUNTER — Ambulatory Visit (HOSPITAL_COMMUNITY)
Admission: RE | Admit: 2019-01-18 | Discharge: 2019-01-18 | Disposition: A | Discharge status: Home / Self Care | Payer: Medicare HMO | Source: Ambulatory Visit | Attending: Pulmonary Disease | Admitting: Pulmonary Disease

## 2019-01-18 ENCOUNTER — Other Ambulatory Visit (HOSPITAL_COMMUNITY): Payer: Self-pay | Admitting: Pulmonary Disease

## 2019-01-18 DIAGNOSIS — J449 Chronic obstructive pulmonary disease, unspecified: Secondary | ICD-10-CM | POA: Diagnosis not present

## 2019-01-18 DIAGNOSIS — S299XXA Unspecified injury of thorax, initial encounter: Secondary | ICD-10-CM | POA: Diagnosis not present

## 2019-01-18 DIAGNOSIS — R0781 Pleurodynia: Secondary | ICD-10-CM | POA: Insufficient documentation

## 2019-01-18 DIAGNOSIS — C3491 Malignant neoplasm of unspecified part of right bronchus or lung: Secondary | ICD-10-CM | POA: Diagnosis not present

## 2019-01-18 DIAGNOSIS — I10 Essential (primary) hypertension: Secondary | ICD-10-CM | POA: Diagnosis not present

## 2019-01-18 DIAGNOSIS — E1165 Type 2 diabetes mellitus with hyperglycemia: Secondary | ICD-10-CM | POA: Diagnosis not present

## 2019-01-21 ENCOUNTER — Ambulatory Visit (INDEPENDENT_AMBULATORY_CARE_PROVIDER_SITE_OTHER): Payer: Medicare HMO

## 2019-01-21 ENCOUNTER — Other Ambulatory Visit: Payer: Self-pay

## 2019-01-21 DIAGNOSIS — J4540 Moderate persistent asthma, uncomplicated: Secondary | ICD-10-CM

## 2019-01-25 ENCOUNTER — Other Ambulatory Visit: Payer: Self-pay

## 2019-01-25 ENCOUNTER — Emergency Department (HOSPITAL_COMMUNITY)
Admission: EM | Admit: 2019-01-25 | Discharge: 2019-01-25 | Disposition: A | Discharge status: Home / Self Care | Payer: Medicare HMO | Attending: Emergency Medicine | Admitting: Emergency Medicine

## 2019-01-25 ENCOUNTER — Encounter (HOSPITAL_COMMUNITY): Payer: Self-pay | Admitting: Emergency Medicine

## 2019-01-25 ENCOUNTER — Emergency Department (HOSPITAL_COMMUNITY): Payer: Medicare HMO

## 2019-01-25 ENCOUNTER — Telehealth: Payer: Self-pay | Admitting: Radiology

## 2019-01-25 DIAGNOSIS — Z7902 Long term (current) use of antithrombotics/antiplatelets: Secondary | ICD-10-CM | POA: Diagnosis not present

## 2019-01-25 DIAGNOSIS — J449 Chronic obstructive pulmonary disease, unspecified: Secondary | ICD-10-CM | POA: Insufficient documentation

## 2019-01-25 DIAGNOSIS — F17210 Nicotine dependence, cigarettes, uncomplicated: Secondary | ICD-10-CM | POA: Diagnosis not present

## 2019-01-25 DIAGNOSIS — Z7984 Long term (current) use of oral hypoglycemic drugs: Secondary | ICD-10-CM | POA: Insufficient documentation

## 2019-01-25 DIAGNOSIS — Z79899 Other long term (current) drug therapy: Secondary | ICD-10-CM | POA: Diagnosis not present

## 2019-01-25 DIAGNOSIS — E119 Type 2 diabetes mellitus without complications: Secondary | ICD-10-CM | POA: Insufficient documentation

## 2019-01-25 DIAGNOSIS — I10 Essential (primary) hypertension: Secondary | ICD-10-CM | POA: Insufficient documentation

## 2019-01-25 DIAGNOSIS — M1712 Unilateral primary osteoarthritis, left knee: Secondary | ICD-10-CM | POA: Diagnosis not present

## 2019-01-25 DIAGNOSIS — I5032 Chronic diastolic (congestive) heart failure: Secondary | ICD-10-CM | POA: Diagnosis not present

## 2019-01-25 DIAGNOSIS — M25562 Pain in left knee: Secondary | ICD-10-CM | POA: Diagnosis not present

## 2019-01-25 MED ORDER — IBUPROFEN 400 MG PO TABS
400.0000 mg | ORAL_TABLET | Freq: Once | ORAL | Status: AC
  Administered 2019-01-25: 400 mg via ORAL
  Filled 2019-01-25: qty 1

## 2019-01-25 MED ORDER — OXYCODONE-ACETAMINOPHEN 5-325 MG PO TABS
1.0000 | ORAL_TABLET | Freq: Once | ORAL | Status: AC
  Administered 2019-01-25: 07:00:00 1 via ORAL
  Filled 2019-01-25: qty 1

## 2019-01-25 NOTE — Telephone Encounter (Signed)
I called him about appointment, have worked him in tomorrow afternoon, he has increased knee pain swelling needs aspiration.
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
## 2019-01-25 NOTE — Discharge Instructions (Addendum)
You were seen today for left knee pain.  Take the prescription by Dr. Luan Pulling as directed.  Apply moist heat or ice to the area(s) of discomfort, for 15 minutes at a time, several times per day for the next few days.  Do not fall asleep on a heating or ice pack.  Walk with your walker for support until you are seen in follow up. Call your regular medical doctor and your Orthopedic doctor today to schedule a follow up appointment this week.  Return to the Emergency Department immediately if worsening.
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
## 2019-01-26 ENCOUNTER — Ambulatory Visit (INDEPENDENT_AMBULATORY_CARE_PROVIDER_SITE_OTHER): Payer: Medicare HMO | Admitting: Orthopedic Surgery

## 2019-01-26 VITALS — BP 129/65 | HR 84 | Temp 97.8°F | Ht 70.0 in | Wt 226.0 lb

## 2019-01-26 DIAGNOSIS — M1A0620 Idiopathic chronic gout, left knee, without tophus (tophi): Secondary | ICD-10-CM

## 2019-01-26 MED ORDER — COLCHICINE 0.6 MG PO TABS: 0.6000 mg | ORAL_TABLET | Freq: Two times a day (BID) | ORAL | 5 refills | Status: DC
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
## 2019-01-26 NOTE — Patient Instructions (Signed)
Ice 4 x a day   Start colchicine, take for 2 weeks

## 2019-02-07 ENCOUNTER — Telehealth: Payer: Self-pay

## 2019-02-07 ENCOUNTER — Other Ambulatory Visit: Payer: Self-pay

## 2019-02-07 MED ORDER — TRELEGY ELLIPTA 100-62.5-25 MCG/INH IN AEPB: 1.0000 | INHALATION_SPRAY | Freq: Every day | RESPIRATORY_TRACT | 5 refills | Status: DC

## 2019-02-07 NOTE — Telephone Encounter (Signed)
Patients wife called stating he is sleeping a lot and the patients back is hurting. Patients daughter has also called stating the same thing.   Please send Trelegy Refill to [facility]

## 2019-02-07 NOTE — Telephone Encounter (Signed)
Refill has been sent in again.
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
## 2019-02-07 NOTE — Telephone Encounter (Signed)
Wife called stating the pharmacy didn't receive the refill we sent in for trelegy.   Lamar.

## 2019-02-08 NOTE — Telephone Encounter (Signed)
He has been on this before. Why is this a problem now? Just wondering...  Salvatore Marvel, MD [facility] [place]

## 2019-02-08 NOTE — Telephone Encounter (Signed)
Prior auth needed for Trelegy to be filled. Submitted on covermymeds. Will await approval/denial.

## 2019-02-08 NOTE — Telephone Encounter (Signed)
I am happy to see him tmr too if he thinks that he needs to be seen in the office. I know he has his Xolair tomorrow so he will be around anyway.  Salvatore Marvel, MD [facility] [place]

## 2019-02-08 NOTE — Telephone Encounter (Signed)
I am not sure. I did submit the prior auth already though.

## 2019-02-09 ENCOUNTER — Encounter: Payer: Self-pay | Admitting: Allergy & Immunology

## 2019-02-09 ENCOUNTER — Telehealth: Payer: Self-pay | Admitting: Orthopedic Surgery

## 2019-02-09 ENCOUNTER — Ambulatory Visit (INDEPENDENT_AMBULATORY_CARE_PROVIDER_SITE_OTHER): Payer: Medicare HMO

## 2019-02-09 ENCOUNTER — Ambulatory Visit (INDEPENDENT_AMBULATORY_CARE_PROVIDER_SITE_OTHER): Payer: Medicare HMO | Admitting: Allergy & Immunology

## 2019-02-09 ENCOUNTER — Other Ambulatory Visit: Payer: Self-pay

## 2019-02-09 VITALS — BP 126/62 | HR 77 | Temp 98.2°F | Resp 18

## 2019-02-09 DIAGNOSIS — J3089 Other allergic rhinitis: Secondary | ICD-10-CM

## 2019-02-09 DIAGNOSIS — R918 Other nonspecific abnormal finding of lung field: Secondary | ICD-10-CM

## 2019-02-09 DIAGNOSIS — J302 Other seasonal allergic rhinitis: Secondary | ICD-10-CM

## 2019-02-09 DIAGNOSIS — F17200 Nicotine dependence, unspecified, uncomplicated: Secondary | ICD-10-CM | POA: Diagnosis not present

## 2019-02-09 DIAGNOSIS — J449 Chronic obstructive pulmonary disease, unspecified: Secondary | ICD-10-CM | POA: Diagnosis not present

## 2019-02-09 DIAGNOSIS — J4540 Moderate persistent asthma, uncomplicated: Secondary | ICD-10-CM

## 2019-02-09 DIAGNOSIS — S2239XD Fracture of one rib, unspecified side, subsequent encounter for fracture with routine healing: Secondary | ICD-10-CM

## 2019-02-09 DIAGNOSIS — J4489 Other specified chronic obstructive pulmonary disease: Secondary | ICD-10-CM
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
## 2019-02-09 NOTE — Patient Instructions (Addendum)
1. Asthma-COPD overlap syndrome - Your lung function looks BETTER than last time!  - We did get the Trelegy approved, so it should be at the pharmacy. - Call us with any issues.  - In the meantime, start the prednisone pack provided in case he has some ongoing inflammation from the rib fracture.  - Daily controller medication(s): Trelegy 100/62.5/25 one puff once daily + Xolair every two weeks - Prior to physical activity: ProAir 2 puffs 10-15 minutes before physical activity. - Rescue medications: ProAir 4 puffs every 4-6 hours as needed or albuterol nebulizer one vial every 4-6 hours as needed - Asthma control goals:  * Full participation in all desired activities (may need albuterol before activity) * Albuterol use two time or less a week on average (not counting use with activity) * Cough interfering with sleep two time or less a month * Oral steroids no more than once a year * No hospitalizations  2. Chronic rhinitis (cat, dog, cockroach, trees, weeds, and ragweed) - Continue with nasal saline rinses 1-2 times daily to try to heal your nose and prevent more bleeds. - Use an antihistamine as needed (Allegra, Zyrtec, Claritin).   3. Return in about 4 months (around 06/11/2019).   Please inform us of any Emergency Department visits, hospitalizations, or changes in symptoms. Call us before going to the ED for breathing or allergy symptoms since we might be able to fit you in for a sick visit. Feel free to contact us anytime with any questions, problems, or concerns.  It was a pleasure to see you again today!   Websites that have reliable patient information: 1. American Academy of Asthma, Allergy, and Immunology: www.aaaai.org 2. Food Allergy Research and Education (FARE): foodallergy.org 3. Mothers of Asthmatics: http://www.asthmacommunitynetwork.org 4. American College of Allergy, Asthma, and Immunology: www.acaai.org
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
## 2019-02-09 NOTE — Telephone Encounter (Signed)
Patient was seen in clinic today 02/09/2019.

## 2019-02-22 ENCOUNTER — Inpatient Hospital Stay (HOSPITAL_COMMUNITY): Payer: Medicare HMO | Attending: Hematology | Admitting: Hematology

## 2019-02-22 ENCOUNTER — Other Ambulatory Visit: Payer: Self-pay

## 2019-02-22 ENCOUNTER — Encounter (HOSPITAL_COMMUNITY): Payer: Self-pay | Admitting: Hematology

## 2019-02-22 VITALS — BP 136/66 | HR 69 | Temp 97.3°F | Resp 20 | Wt 226.0 lb

## 2019-02-22 DIAGNOSIS — C3411 Malignant neoplasm of upper lobe, right bronchus or lung: Secondary | ICD-10-CM | POA: Insufficient documentation

## 2019-02-22 DIAGNOSIS — C3491 Malignant neoplasm of unspecified part of right bronchus or lung: Secondary | ICD-10-CM
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
## 2019-02-22 NOTE — Patient Instructions (Addendum)
[facility] at [facility] Discharge Instructions  You were seen today by Dr. Katragadda. He went over your recent lab results. He will see you back in 2 months for labs and follow up.   Thank you for choosing [facility] at [facility] to provide your oncology and hematology care.  To afford each patient quality time with our provider, please arrive at least 15 minutes before your scheduled appointment time.   If you have a lab appointment with the Cancer Center please come in thru the  Main Entrance and check in at the main information desk  You need to re-schedule your appointment should you arrive 10 or more minutes late.  We strive to give you quality time with our providers, and arriving late affects you and other patients whose appointments are after yours.  Also, if you no show three or more times for appointments you may be dismissed from the clinic at the providers discretion.     Again, thank you for choosing [facility].  Our hope is that these requests will decrease the amount of time that you wait before being seen by our physicians.       _____________________________________________________________  Should you have questions after your visit to [facility], please contact our office at [phone] between the hours of 8:00 a.m. and 4:30 p.m.  Voicemails left after 4:00 p.m. will not be returned until the following business day.  For prescription refill requests, have your pharmacy contact our office and allow 72 hours.    Cancer Center Support Programs:   > Cancer Support Group  2nd Tuesday of the month 1pm-2pm, Journey Room    

## 2019-02-23 ENCOUNTER — Ambulatory Visit (INDEPENDENT_AMBULATORY_CARE_PROVIDER_SITE_OTHER): Payer: Medicare HMO

## 2019-02-23 DIAGNOSIS — J4540 Moderate persistent asthma, uncomplicated: Secondary | ICD-10-CM

## 2019-03-03 DIAGNOSIS — G4733 Obstructive sleep apnea (adult) (pediatric): Secondary | ICD-10-CM | POA: Diagnosis not present

## 2019-03-07 ENCOUNTER — Encounter: Payer: Medicare HMO | Admitting: Nutrition

## 2019-03-09 ENCOUNTER — Other Ambulatory Visit: Payer: Self-pay

## 2019-03-09 ENCOUNTER — Ambulatory Visit (INDEPENDENT_AMBULATORY_CARE_PROVIDER_SITE_OTHER): Payer: Medicare HMO | Admitting: *Deleted

## 2019-03-09 DIAGNOSIS — J4540 Moderate persistent asthma, uncomplicated: Secondary | ICD-10-CM | POA: Diagnosis not present

## 2019-03-10 ENCOUNTER — Other Ambulatory Visit: Payer: Self-pay | Admitting: *Deleted

## 2019-03-10 MED ORDER — TRELEGY ELLIPTA 100-62.5-25 MCG/INH IN AEPB: 1.0000 | INHALATION_SPRAY | Freq: Every day | RESPIRATORY_TRACT | 1 refills | Status: DC

## 2019-03-14 ENCOUNTER — Telehealth: Payer: Self-pay | Admitting: Orthopedic Surgery

## 2019-03-14 ENCOUNTER — Other Ambulatory Visit: Payer: Self-pay

## 2019-03-14 MED ORDER — TRELEGY ELLIPTA 100-62.5-25 MCG/INH IN AEPB: 1.0000 | INHALATION_SPRAY | Freq: Every day | RESPIRATORY_TRACT | 1 refills | Status: DC

## 2019-03-14 NOTE — Telephone Encounter (Signed)
I ll be here tomorrow afternoon

## 2019-03-14 NOTE — Telephone Encounter (Signed)
Patient called after waking up with swollen right ankle and swollen right knee, no injury. Asking if any work-in appointments are available with Dr Aline Brochure. According to schedule, no immediate appointment. Patient will also contact primary care - please advise if should continue to pursue with primary care, or if he is to see Dr Aline Brochure for these issues.

## 2019-03-14 NOTE — Telephone Encounter (Signed)
Called patient; scheduled accordingly; patient aware.

## 2019-03-15 ENCOUNTER — Encounter: Payer: Self-pay | Admitting: Orthopedic Surgery

## 2019-03-15 ENCOUNTER — Other Ambulatory Visit: Payer: Self-pay

## 2019-03-15 ENCOUNTER — Ambulatory Visit: Payer: Medicare HMO | Admitting: Orthopedic Surgery

## 2019-03-15 VITALS — BP 165/70 | HR 65 | Ht 70.0 in | Wt 231.0 lb

## 2019-03-15 DIAGNOSIS — M5410 Radiculopathy, site unspecified: Secondary | ICD-10-CM

## 2019-03-15 DIAGNOSIS — M25461 Effusion, right knee: Secondary | ICD-10-CM | POA: Diagnosis not present

## 2019-03-15 DIAGNOSIS — L570 Actinic keratosis: Secondary | ICD-10-CM | POA: Diagnosis not present

## 2019-03-15 MED ORDER — HYDROCODONE-ACETAMINOPHEN 5-325 MG PO TABS: 1.0000 | ORAL_TABLET | Freq: Four times a day (QID) | ORAL | 0 refills | Status: DC | PRN

## 2019-03-15 MED ORDER — METHOCARBAMOL 500 MG PO TABS: 500.0000 mg | ORAL_TABLET | Freq: Three times a day (TID) | ORAL | 1 refills | Status: DC

## 2019-03-15 MED ORDER — GABAPENTIN 100 MG PO CAPS: 100.0000 mg | ORAL_CAPSULE | Freq: Three times a day (TID) | ORAL | 2 refills | Status: DC

## 2019-03-15 NOTE — Progress Notes (Signed)
Kevin Frank  03/15/2019  HISTORY SECTION :  Chief Complaint  Patient presents with  . Leg Pain    pain from buttock into right leg all the way down also left leg at times  . Knee Pain    right knee painful and swollen    The patient presents for evaluation of pain.  He has chronic effusions right knee has a history of gout usually gets aspiration injection with good relief  The right buttock and right leg pain approximately 2 weeks time dull aching sensation running down the right leg starting in the right buttocks no associated bowel bladder dysfunction no numbness or tingling in the right leg pain levels moderate 6-7 out of 10    Review of Systems  Constitutional: Negative for chills, fever and weight loss.  Musculoskeletal: Positive for joint pain.  Skin: Negative for itching and rash.  Neurological: Negative for tingling and sensory change.     has a past medical history of AAA (abdominal aortic aneurysm) ([place]) (01/2008), Asthma, BPH (benign prostatic hyperplasia), Chronic knee pain, COPD (chronic obstructive pulmonary disease) ([place]), Diabetes mellitus, Gout, History of kidney stones, Hypercholesterolemia, Hyperlipidemia, Hypertension, Normal nuclear stress test (2011), Obesity, Obstructive sleep apnea on CPAP, Presence of indwelling urinary catheter, Shortness of breath, and Tobacco abuse.   Past Surgical History:  Procedure Laterality Date  . ABDOMINAL AORTIC ENDOVASCULAR FENESTRATED STENT GRAFT N/A 04/09/2016   Procedure: ABDOMINAL AORTIC ENDOVASCULAR FENESTRATED STENT GRAFT with left brachial artery access using ultrasound guidance;  Surgeon: Serafina Mitchell, MD;  Location: [facility];  Service: Vascular;  Laterality: N/A;  . [place]   [place]  . PERIPHERAL VASCULAR CATHETERIZATION N/A 04/10/2016   Procedure: Renal Angiography;  Surgeon: Angelia Mould, MD;  Location: [facility];  Service: Cardiovascular;  Laterality: N/A;  . TRANSURETHRAL RESECTION  OF PROSTATE  01/07/2011   Procedure: TRANSURETHRAL RESECTION OF THE PROSTATE (TURP);  Surgeon: Marissa Nestle;  Location: [facility];  Service: Urology;  Laterality: N/A;    Body mass index is 33.15 kg/m.   Allergies  Allergen Reactions  . No Known Allergies      Current Outpatient Medications:  .  allopurinol (ZYLOPRIM) 300 MG tablet, Take 300 mg by mouth daily.  , Disp: , Rfl:  .  amLODipine (NORVASC) 10 MG tablet, TAKE 1 TABLET EVERY DAY, Disp: 90 tablet, Rfl: 3 .  atorvastatin (LIPITOR) 20 MG tablet, Take 20 mg by mouth daily. , Disp: , Rfl:  .  azelastine (ASTELIN) 0.1 % nasal spray, Place 1 spray into both nostrils 2 (two) times daily. Use in each nostril as directed, Disp: , Rfl:  .  clopidogrel (PLAVIX) 75 MG tablet, TAKE (1) TABLET BY MOUTH ONCE DAILY., Disp: 30 tablet, Rfl: 0 .  Fluticasone-Umeclidin-Vilant (TRELEGY ELLIPTA) 100-62.5-25 MCG/INH AEPB, Inhale 1 puff into the lungs daily., Disp: 180 each, Rfl: 1 .  metFORMIN (GLUCOPHAGE) 500 MG tablet, Take 1,000 mg by mouth 2 (two) times daily with a meal. , Disp: , Rfl:  .  metoprolol succinate (TOPROL-XL) 25 MG 24 hr tablet, TAKE 1 TABLET EVERY DAY, Disp: 90 tablet, Rfl: 3 .  omalizumab (XOLAIR) 150 MG injection, Inject 375 mg into the skin every 14 (fourteen) days., Disp: 6 each, Rfl: 11 .  sertraline (ZOLOFT) 50 MG tablet, Take 50 mg by mouth daily.  , Disp: , Rfl:  .  SPIRIVA HANDIHALER 18 MCG inhalation capsule, , Disp: , Rfl:  .  torsemide (DEMADEX) 20 MG tablet,  Take 40 mg by mouth daily. , Disp: , Rfl:  .  traMADol (ULTRAM) 50 MG tablet, , Disp: , Rfl:  .  albuterol (PROVENTIL HFA;VENTOLIN HFA) 108 (90 BASE) MCG/ACT inhaler, Inhale 2 puffs into the lungs every 6 (six) hours as needed (rescue inhaler). , Disp: , Rfl:  .  colchicine 0.6 MG tablet, Take 1 tablet (0.6 mg total) by mouth 2 (two) times daily. (Patient not taking: Reported on 03/15/2019), Disp: 14 tablet, Rfl: 5 .  hydrALAZINE (APRESOLINE) 50 MG tablet, TAKE (1)  TABLET TWICE DAILY. (Patient not taking: Reported on 03/15/2019), Disp: 180 tablet, Rfl: 3 .  losartan (COZAAR) 100 MG tablet, TAKE 1 TABLET (100 MG TOTAL) BY MOUTH DAILY., Disp: 90 tablet, Rfl: 2  Current Facility-Administered Medications:  .  ipratropium (ATROVENT) nebulizer solution 0.5 mg, 0.5 mg, Nebulization, Once, Ishmael Holter, Roselyn M, MD .  levalbuterol [facility]) nebulizer solution 1.25 mg, 1.25 mg, Nebulization, Once, Gean Quint, MD .  omalizumab Arvid Right) injection 375 mg, 375 mg, Subcutaneous, Q14 Days, Valentina Shaggy, MD, 375 mg at 03/09/19 1010   PHYSICAL EXAM SECTION: 1) BP (!) 165/70   Pulse 65   Ht 5\' 10"  (1.778 m)   Wt 231 lb (104.8 kg)   BMI 33.15 kg/m   Body mass index is 33.15 kg/m. General appearance: Well-developed well-nourished no gross deformities  2) Cardiovascular normal pulse and perfusion in the lower extremities normal color without edema  3) Neurologically deep tendon reflexes are equal and normal, no sensation loss or deficits no pathologic reflexes  4) Psychological: Awake alert and oriented x3 mood and affect normal  5) Skin no lacerations or ulcerations no nodularity no palpable masses, no erythema or nodularity  6) Musculoskeletal:   Left knee range of motion normal no instability normal muscle tone no swelling no tenderness  Right knee effusion tenderness slight decreased range of motion muscle tone normal no instability  Nontender lumbar spine with tenderness lower back skin normal over the back straight leg raise negative   MEDICAL DECISION SECTION:  Encounter Diagnoses  Name Primary?  . Radicular leg pain Yes  . Effusion of right knee     Imaging None  Plan:  (Rx., Inj., surg., Frx, MRI/CT, XR:2)  Procedure note injection and aspiration right knee joint  Verbal consent was obtained to aspirate and inject the right knee joint   Timeout was completed to confirm the site of aspiration and injection  An 18-gauge needle  was used to aspirate the knee joint from a suprapatellar lateral approach.  The medications used were 40 mg of Depo-Medrol and 1% lidocaine 3 cc  Anesthesia was provided by ethyl chloride and the skin was prepped with alcohol.  After cleaning the skin with alcohol an 18-gauge needle was used to aspirate the right knee joint.  We obtained 20  cc of fluid blood-tinged synovial yellow to red  We follow this by injection of 40 mg of Depo-Medrol and 3 cc 1% lidocaine.  There were no complications. A sterile bandage was applied.  Meds ordered this encounter  Medications  . gabapentin (NEURONTIN) 100 MG capsule    Sig: Take 1 capsule (100 mg total) by mouth 3 (three) times daily.    Dispense:  90 capsule    Refill:  2  . HYDROcodone-acetaminophen (NORCO/VICODIN) 5-325 MG tablet    Sig: Take 1 tablet by mouth every 6 (six) hours as needed for moderate pain.    Dispense:  30 tablet    Refill:  0  . methocarbamol (ROBAXIN) 500 MG tablet    Sig: Take 1 tablet (500 mg total) by mouth 3 (three) times daily.    Dispense:  60 tablet    Refill:  1     2:21 PM Arther Abbott, MD  03/15/2019

## 2019-03-15 NOTE — Patient Instructions (Signed)
New meds for the hip and leg pain

## 2019-03-21 DIAGNOSIS — F17200 Nicotine dependence, unspecified, uncomplicated: Secondary | ICD-10-CM | POA: Diagnosis not present

## 2019-03-21 DIAGNOSIS — Z23 Encounter for immunization: Secondary | ICD-10-CM | POA: Diagnosis not present

## 2019-03-21 DIAGNOSIS — I10 Essential (primary) hypertension: Secondary | ICD-10-CM | POA: Diagnosis not present

## 2019-03-21 DIAGNOSIS — I739 Peripheral vascular disease, unspecified: Secondary | ICD-10-CM | POA: Diagnosis not present

## 2019-03-21 DIAGNOSIS — I5032 Chronic diastolic (congestive) heart failure: Secondary | ICD-10-CM | POA: Diagnosis not present

## 2019-03-21 DIAGNOSIS — J449 Chronic obstructive pulmonary disease, unspecified: Secondary | ICD-10-CM | POA: Diagnosis not present

## 2019-03-21 DIAGNOSIS — E119 Type 2 diabetes mellitus without complications: Secondary | ICD-10-CM | POA: Diagnosis not present

## 2019-03-23 ENCOUNTER — Ambulatory Visit (INDEPENDENT_AMBULATORY_CARE_PROVIDER_SITE_OTHER): Payer: Medicare HMO

## 2019-03-23 ENCOUNTER — Ambulatory Visit: Payer: Self-pay

## 2019-03-23 DIAGNOSIS — J449 Chronic obstructive pulmonary disease, unspecified: Secondary | ICD-10-CM | POA: Diagnosis not present

## 2019-03-23 DIAGNOSIS — E119 Type 2 diabetes mellitus without complications: Secondary | ICD-10-CM | POA: Diagnosis not present

## 2019-03-23 DIAGNOSIS — F17200 Nicotine dependence, unspecified, uncomplicated: Secondary | ICD-10-CM | POA: Diagnosis not present

## 2019-03-23 DIAGNOSIS — J4540 Moderate persistent asthma, uncomplicated: Secondary | ICD-10-CM | POA: Diagnosis not present

## 2019-03-23 DIAGNOSIS — I10 Essential (primary) hypertension: Secondary | ICD-10-CM | POA: Diagnosis not present

## 2019-03-23 LAB — BASIC METABOLIC PANEL
BUN: 21 (ref 4–21)
Creatinine: 1.1 (ref <=1.3)
Glucose: 124

## 2019-03-23 LAB — LIPID PANEL
Cholesterol: 117 (ref 0–200)
HDL: 39 (ref 35–70)
LDL Cholesterol: 61
Triglycerides: 89 (ref 40–160)

## 2019-03-23 LAB — TSH: TSH: 1.15 (ref <=5.90)

## 2019-04-04 ENCOUNTER — Ambulatory Visit: Payer: Medicare HMO | Admitting: Family Medicine

## 2019-04-06 ENCOUNTER — Ambulatory Visit: Payer: Self-pay

## 2019-04-06 ENCOUNTER — Telehealth: Payer: Self-pay | Admitting: Orthopedic Surgery

## 2019-04-06 ENCOUNTER — Ambulatory Visit (INDEPENDENT_AMBULATORY_CARE_PROVIDER_SITE_OTHER): Payer: Medicare HMO

## 2019-04-06 DIAGNOSIS — J4540 Moderate persistent asthma, uncomplicated: Secondary | ICD-10-CM

## 2019-04-06 NOTE — Telephone Encounter (Signed)
Having cramps in leg.  What can he take or do for this?  Please call the patient  Thanks

## 2019-04-06 NOTE — Telephone Encounter (Signed)
I have called him to advise to call primary care to see if they can help with leg cramps. To you FYI   Left message

## 2019-04-07 ENCOUNTER — Encounter: Payer: Self-pay | Admitting: Family Medicine

## 2019-04-07 ENCOUNTER — Other Ambulatory Visit: Payer: Self-pay

## 2019-04-07 ENCOUNTER — Ambulatory Visit (INDEPENDENT_AMBULATORY_CARE_PROVIDER_SITE_OTHER): Payer: Medicare HMO | Admitting: Family Medicine

## 2019-04-07 VITALS — BP 152/96 | HR 76 | Temp 97.9°F | Ht 70.0 in | Wt 232.6 lb

## 2019-04-07 DIAGNOSIS — G8929 Other chronic pain: Secondary | ICD-10-CM | POA: Diagnosis not present

## 2019-04-07 DIAGNOSIS — M25562 Pain in left knee: Secondary | ICD-10-CM

## 2019-04-07 NOTE — Progress Notes (Signed)
New Patient Office Visit  Subjective:  Patient ID: Kevin Frank, male    DOB: 1940/10/29  Age: 78 y.o. MRN: [id number]  CC:  Chief Complaint  Patient presents with  . Establish Care  . Joint Swelling    left knee    HPI Kevin Frank presents for left knee pain-seen by Ortho in the past for injections  IMPRESSION:8/20 xrays 1. Tricompartment degenerative change with chondrocalcinosis. No acute bony abnormality. Knee joint effusion cannot be excluded. Exam stable from prior exam. Depo injections in the past-appt with Ortho Monday for injection Pain medication by Ortho and Dr. Luan Pulling w/i last 2 months.  03/15/2019  2   03/15/2019  Hydrocodone-Acetamin 5-325 MG  30.00  8 [place]   [id number]   [place] ([id number])   0  18.75 MME  Comm Ins   [place]  01/24/2019  2   01/24/2019  Tramadol Hcl 50 MG Tablet  20.00  5 [facility] [place]   [id number]   [place] ([id number])   0  20.00 MME  Comm Ins   [place]  02/26/2018  1   02/26/2018  Hydrocodone-Homatropine Syrup  240.00  12 Ed [place]   [id number]   [place] ([id number])   0  20.00 MME  Private Pay   [place]  02/01/2018  2   02/01/2018  Tramadol Hcl 50 MG Tablet  8.00  3 [place]   [id number]   L[place] ([id number])   0  13.33 MME  Comm Ins   [place]  07/04/2017  1   07/04/2017  Tramadol Hcl 50 MG Tablet  15.00  4 Ro [facility]   [id number]   [place] ([id number])   0  18.75 MME  Medicare       Past Medical History:  Diagnosis Date  . AAA (abdominal aortic aneurysm) ([place]) 01/2008   3.2cm  . Allergy   . Arthritis   . Asthma   . BPH (benign prostatic hyperplasia)   . Chronic knee pain    bilateral  . COPD (chronic obstructive pulmonary disease) ([place])   . Diabetes mellitus    type II  . Gout   . History of kidney stones   . Hypercholesterolemia   . Hyperlipidemia   . Hypertension   . Normal nuclear stress test 2011  . Obesity   . Obstructive sleep apnea on CPAP   . Presence of indwelling urinary catheter   . Shortness of breath    due to cigarette abuse  . Tobacco abuse     Past Surgical History:  Procedure  Laterality Date  . ABDOMINAL AORTIC ENDOVASCULAR FENESTRATED STENT GRAFT N/A 04/09/2016   Procedure: ABDOMINAL AORTIC ENDOVASCULAR FENESTRATED STENT GRAFT with left brachial artery access using ultrasound guidance;  Surgeon: Serafina Mitchell, MD;  Location: [facility];  Service: Vascular;  Laterality: N/A;  . [place]   [place]  . PERIPHERAL VASCULAR CATHETERIZATION N/A 04/10/2016   Procedure: Renal Angiography;  Surgeon: Angelia Mould, MD;  Location: [facility];  Service: Cardiovascular;  Laterality: N/A;  . TRANSURETHRAL RESECTION OF PROSTATE  01/07/2011   Procedure: TRANSURETHRAL RESECTION OF THE PROSTATE (TURP);  Surgeon: Marissa Nestle;  Location: [facility];  Service: Urology;  Laterality: N/A;    Family History  Problem Relation Age of Onset  . Liver disease Father   . Lymphoma Mother   . Diabetes Mother   . Hypertension Mother   . Cancer Mother   . Mental retardation Other  sibling -- from birth   . Asthma Daughter   . Allergic rhinitis Neg Hx   . Angioedema Neg Hx   . Atopy Neg Hx   . Eczema Neg Hx   . Immunodeficiency Neg Hx     Social History   Socioeconomic History  . Marital status: Married    Spouse name: Not on file  . Number of children: Not on file  . Years of education: Not on file  . Highest education level: Not on file  Occupational History  . Not on file  Social Needs  . Financial resource strain: Not on file  . Food insecurity    Worry: Not on file    Inability: Not on file  . Transportation needs    Medical: No    Non-medical: No  Tobacco Use  . Smoking status: Current Every Day Smoker    Packs/day: 3.00    Years: 50.00    Pack years: 150.00    Types: Cigarettes  . Smokeless tobacco: Never Used  . Tobacco comment: 2ppd 10/30/2016 ee  Substance and Sexual Activity  . Alcohol use: Yes    Alcohol/week: 0.0 standard drinks    Comment: very little  . Drug use: No  . Sexual activity: Not on file  Lifestyle  .  Physical activity    Days per week: Not on file    Minutes per session: Not on file  . Stress: Not on file  Relationships  . Social Herbalist on phone: Not on file    Gets together: Not on file    Attends religious service: Not on file    Active member of club or organization: Not on file    Attends meetings of clubs or organizations: Not on file    Relationship status: Not on file  . Intimate partner violence    Fear of current or ex partner: Not on file    Emotionally abused: Not on file    Physically abused: Not on file    Forced sexual activity: Not on file  Other Topics Concern  . Not on file  Social History Narrative  . Not on file    ROS Review of Systems  Respiratory: Positive for cough and shortness of breath.   Musculoskeletal: Positive for arthralgias and joint swelling.       Rib fractures  bilat knee pain-swelling on the left    Objective:   Today's Vitals: BP (!) 152/96 (BP Location: Left Arm, Patient Position: Sitting, Cuff Size: Normal)   Pulse 76   Temp 97.9 F (36.6 C) (Oral)   Ht 5\' 10"  (1.778 m)   Wt 232 lb 9.6 oz (105.5 kg)   SpO2 90%   BMI 33.37 kg/m   Physical Exam Vitals signs reviewed.  Constitutional:      Appearance: Normal appearance.  HENT:     Head: Normocephalic and atraumatic.  Cardiovascular:     Rate and Rhythm: Normal rate and regular rhythm.     Pulses: Normal pulses.     Heart sounds: Normal heart sounds.  Pulmonary:     Effort: Pulmonary effort is normal.     Breath sounds: Normal breath sounds.  Musculoskeletal:        General: Swelling present.     Comments: Left knee -LROM, medial edema, no eryth  Neurological:     Mental Status: He is alert.     Assessment & Plan:   1. Chronic pain of left knee Elevation, ice,  knee support-tylenol -max 6-500mg  /day. Keep appt with ortho Monday-ask about options for non surgical treatment. Concern for LROM Outpatient Encounter Medications as of 04/07/2019   Medication Sig  . albuterol (PROVENTIL HFA;VENTOLIN HFA) 108 (90 BASE) MCG/ACT inhaler Inhale 2 puffs into the lungs every 6 (six) hours as needed (rescue inhaler).   [name] allopurinol (ZYLOPRIM) 300 MG tablet Take 300 mg by mouth daily.    [name] amLODipine (NORVASC) 10 MG tablet TAKE 1 TABLET EVERY DAY  . atorvastatin (LIPITOR) 20 MG tablet Take 20 mg by mouth daily.   [name] azelastine (ASTELIN) 0.1 % nasal spray Place 1 spray into both nostrils 2 (two) times daily. Use in each nostril as directed  . clopidogrel (PLAVIX) 75 MG tablet TAKE (1) TABLET BY MOUTH ONCE DAILY.  [name] colchicine 0.6 MG tablet Take 1 tablet (0.6 mg total) by mouth 2 (two) times daily. (Patient not taking: Reported on 03/15/2019)  . Fluticasone-Umeclidin-Vilant (TRELEGY ELLIPTA) 100-62.5-25 MCG/INH AEPB Inhale 1 puff into the lungs daily.  [name] gabapentin (NEURONTIN) 100 MG capsule Take 1 capsule (100 mg total) by mouth 3 (three) times daily.  . hydrALAZINE (APRESOLINE) 50 MG tablet TAKE (1) TABLET TWICE DAILY. (Patient not taking: Reported on 03/15/2019)  . HYDROcodone-acetaminophen (NORCO/VICODIN) 5-325 MG tablet Take 1 tablet by mouth every 6 (six) hours as needed for moderate pain.  [name] losartan (COZAAR) 100 MG tablet TAKE 1 TABLET (100 MG TOTAL) BY MOUTH DAILY.  . metFORMIN (GLUCOPHAGE) 500 MG tablet Take 1,000 mg by mouth 2 (two) times daily with a meal.   . methocarbamol (ROBAXIN) 500 MG tablet Take 1 tablet (500 mg total) by mouth 3 (three) times daily.  . metoprolol succinate (TOPROL-XL) 25 MG 24 hr tablet TAKE 1 TABLET EVERY DAY  . omalizumab (XOLAIR) 150 MG injection Inject 375 mg into the skin every 14 (fourteen) days.  [name] sertraline (ZOLOFT) 50 MG tablet Take 50 mg by mouth daily.    [name] torsemide (DEMADEX) 20 MG tablet Take 40 mg by mouth daily.   . traMADol (ULTRAM) 50 MG tablet   . [DISCONTINUED] SPIRIVA HANDIHALER 18 MCG inhalation capsule    Facility-Administered Encounter Medications as of 04/07/2019  Medication  .  ipratropium (ATROVENT) nebulizer solution 0.5 mg  . levalbuterol (XOPENEX) nebulizer solution 1.25 mg  . omalizumab Arvid Right) injection 375 mg    Follow-up: keep f/u appt for new pt to address chronic disease concerns   Neeka Urista Hannah Beat, MD

## 2019-04-07 NOTE — Progress Notes (Signed)
CARDIOLOGY OFFICE NOTE  Date:  04/12/2019    Kevin Frank Date of Birth: 04-17-1941 Medical Record #[id number]  PCP:  Maryruth Hancock, MD  Cardiologist:  Jerel Shepherd    Chief Complaint  Patient presents with   Follow-up    History of Present Illness: Kevin Frank is a 78 y.o. male who presents today for a follow up visit. This is a 4 month check. Seen for Dr. Burt Knack. He is a former patient of Dr. Susa Simmonds. Primarily follows with me.   He has a history of hypertension, hyperlipidemia, DM, tobacco abuse, COPD, and abdominal aortic aneurysm.On 04/09/2016 he underwent FEVARwith a single rightrenal fenestration by Dr. Trula Slade. He also had left renal angioplasty at the time of the main body deployment service to ensure that the graft not cover the renal artery because of angulation. The day following his procedure, his creatinine increased and ultrasound did not show good perfusion to the left kidney and therefore he went to the Cath Lab and had stenting of his left renal artery. His creatinine trended down and his urine output improved. The patient also had issues with left leg numbness in his legs after the procedure. Neurology was consulted. It was felt this was most likely secondary to lower back disease that was aggravated during surgery.  He continues to smoke cigarettes. He has not had any known CADsymptoms but has had prior CT scan showing left main and 3VD.He has been on chronic Plavix.He has had lung nodules noted as well.He had progressive shortness of breath last year - found to have lung cancer - has had XRT - felt to be poor candidate for surgical intervention - saw EBG.  He has continued to smoke. Echo was updated and showed worsening pulmonary HTN.  We did a telehealth visit back in early June - he was doing ok. Had had follow up CT showing minimal decrease in his lung nodule. Cardiac status was ok.   The patient does not have symptoms  concerning for COVID-19 infection (fever, chills, cough, or new shortness of breath).   Comes in today. Here alone. He is bothered more by "fluid in his knees" - sounds like he has had some aspirations. No chest pain. Says his breathing is stable. Smoking continues - no plans to stop. For follow up scans next month with oncology follow up. Has had labs last month. Has new PCP now since Dr. Luan Pulling is retiring. He is asking about possible knee surgery - was told that this was probably not a good idea - I would agree.   Past Medical History:  Diagnosis Date   AAA (abdominal aortic aneurysm) ([place]) 01/2008   3.2cm   Allergy    Arthritis    Asthma    BPH (benign prostatic hyperplasia)    Chronic knee pain    bilateral   COPD (chronic obstructive pulmonary disease) ([place])    Diabetes mellitus    type II   Gout    History of kidney stones    Hypercholesterolemia    Hyperlipidemia    Hypertension    Normal nuclear stress test 2011   Obesity    Obstructive sleep apnea on CPAP    Presence of indwelling urinary catheter    Shortness of breath    due to cigarette abuse   Tobacco abuse     Past Surgical History:  Procedure Laterality Date   ABDOMINAL AORTIC ENDOVASCULAR FENESTRATED STENT GRAFT N/A 04/09/2016   Procedure: ABDOMINAL  AORTIC ENDOVASCULAR FENESTRATED STENT GRAFT with left brachial artery access using ultrasound guidance;  Surgeon: Serafina Mitchell, MD;  Location: [facility];  Service: Vascular;  Laterality: N/A;   [place]   [place] N/A 04/10/2016   Procedure: Renal Angiography;  Surgeon: Angelia Mould, MD;  Location: [facility];  Service: Cardiovascular;  Laterality: N/A;   TRANSURETHRAL RESECTION OF PROSTATE  01/07/2011   Procedure: TRANSURETHRAL RESECTION OF THE PROSTATE (TURP);  Surgeon: Marissa Nestle;  Location: [facility];  Service: Urology;  Laterality: N/A;     Medications: Current Meds    Medication Sig   albuterol (PROVENTIL HFA;VENTOLIN HFA) 108 (90 BASE) MCG/ACT inhaler Inhale 2 puffs into the lungs every 6 (six) hours as needed (rescue inhaler).    allopurinol (ZYLOPRIM) 300 MG tablet Take 300 mg by mouth daily.     amLODipine (NORVASC) 10 MG tablet TAKE 1 TABLET EVERY DAY   atorvastatin (LIPITOR) 20 MG tablet Take 20 mg by mouth daily.    azelastine (ASTELIN) 0.1 % nasal spray Place 1 spray into both nostrils 2 (two) times daily. Use in each nostril as directed   clopidogrel (PLAVIX) 75 MG tablet TAKE (1) TABLET BY MOUTH ONCE DAILY.   colchicine 0.6 MG tablet Take 1 tablet (0.6 mg total) by mouth 2 (two) times daily.   Fluticasone-Umeclidin-Vilant (TRELEGY ELLIPTA) 100-62.5-25 MCG/INH AEPB Inhale 1 puff into the lungs daily.   gabapentin (NEURONTIN) 100 MG capsule Take 1 capsule (100 mg total) by mouth 3 (three) times daily.   hydrALAZINE (APRESOLINE) 50 MG tablet TAKE (1) TABLET TWICE DAILY.   HYDROcodone-acetaminophen (NORCO/VICODIN) 5-325 MG tablet Take 1 tablet by mouth every 6 (six) hours as needed for moderate pain.   metFORMIN (GLUCOPHAGE) 500 MG tablet Take 1,000 mg by mouth 2 (two) times daily with a meal.    methocarbamol (ROBAXIN) 500 MG tablet Take 1 tablet (500 mg total) by mouth 3 (three) times daily.   metoprolol succinate (TOPROL-XL) 25 MG 24 hr tablet TAKE 1 TABLET EVERY DAY   omalizumab (XOLAIR) 150 MG injection Inject 375 mg into the skin every 14 (fourteen) days.   sertraline (ZOLOFT) 50 MG tablet Take 50 mg by mouth daily.     torsemide (DEMADEX) 20 MG tablet Take 40 mg by mouth daily.    traMADol (ULTRAM) 50 MG tablet    Current Facility-Administered Medications for the 04/12/19 encounter (Office Visit) with Kevin Junes, NP  Medication   ipratropium (ATROVENT) nebulizer solution 0.5 mg   levalbuterol (XOPENEX) nebulizer solution 1.25 mg   omalizumab Arvid Right) injection 375 mg     Allergies: Allergies  Allergen  Reactions   No Known Allergies     Social History: The patient  reports that he has been smoking cigarettes. He has a 150.00 pack-year smoking history. He has never used smokeless tobacco. He reports current alcohol use. He reports that he does not use drugs.   Family History: The patient's family history includes Asthma in his daughter; Cancer in his mother; Diabetes in his mother; Hypertension in his mother; Liver disease in his father; Lymphoma in his mother; Mental retardation in an other family member.   Review of Systems: Please see the history of present illness.   All other systems are reviewed and negative.   Physical Exam: VS:  BP (!) 144/68    Pulse 75    Ht 5\' 10"  (1.778 m)    Wt 230 lb (104.3 kg)  SpO2 94%    BMI 33.00 kg/m  .  BMI Body mass index is 33 kg/m.  Wt Readings from Last 3 Encounters:  04/12/19 230 lb (104.3 kg)  04/08/19 232 lb (105.2 kg)  04/07/19 232 lb 9.6 oz (105.5 kg)    General: Alert and in no acute distress.  His weight is down 5 pounds since I last saw him here in the office.  He smells heavily of tobacco.  HEENT: Normal.  Neck: Supple, no JVD, carotid bruits, or masses noted.  Cardiac: Regular rate and rhythm. No murmurs, rubs, or gallops. No edema.  Respiratory:  Lungs are coarse.  GI: Soft and nontender.  MS: No deformity or atrophy. Gait and ROM intact.  Skin: Warm and dry. Color is normal.  Neuro:  Strength and sensation are intact and no gross focal deficits noted.  Psych: Alert, appropriate and with normal affect.   LABORATORY DATA:  EKG:  EKG is ordered today. This demonstrates NSR with RSR' and inferior Q's. Unchanged from prior tracings.   Lab Results  Component Value Date   WBC 8.1 11/17/2018   HGB 16.0 11/17/2018   HCT 47.9 11/17/2018   PLT 159 11/17/2018   GLUCOSE 106 (H) 11/17/2018   CHOL 138 12/16/2010   TRIG 53.0 12/16/2010   HDL 46.10 12/16/2010   LDLCALC 81 12/16/2010   ALT 23 11/17/2018   AST 20 11/17/2018    NA 140 11/17/2018   K 4.1 11/17/2018   CL 101 11/17/2018   CREATININE 1.12 11/17/2018   BUN 18 11/17/2018   CO2 25 11/17/2018   INR 0.96 03/19/2018   HGBA1C 6.0 (H) 04/02/2016       BNP (last 3 results) No results for input(s): BNP in the last 8760 hours.  ProBNP (last 3 results) No results for input(s): PROBNP in the last 8760 hours.   Other Studies Reviewed Today:  CT CHEST IMPRESSION 11/17/2018: 1. Stable exam. Similar to minimally decreased size of right upper lobe pulmonary nodule. 2. Aortic Atherosclerosis (ICD10-I70.0) and Emphysema (ICD10-J43.9). 3. Coronary artery calcifications.   Electronically Signed By: Kerby Moors M.D. On: 11/17/2018 13:14   EchoStudy Conclusions8/2019  - Left ventricle: The cavity size was normal. There was mild concentric hypertrophy. Systolic function was normal. The estimated ejection fraction was in the range of 55% to 60%. Wall motion was normal; there were no regional wall motion abnormalities. Features are consistent with a pseudonormal left ventricular filling pattern, with concomitant abnormal relaxation and increased filling pressure (grade 2 diastolic dysfunction). - Aortic valve: Sclerosis without stenosis. There was no regurgitation. - Mitral valve: There was trivial regurgitation. - Left atrium: The atrium was mildly dilated. - Right ventricle: The cavity size was moderately dilated. Wall thickness was normal. Systolic function was normal. RV systolic pressure (S, est): 53 mm Hg. - Atrial septum: No defect or patent foramen ovale was identified. - Tricuspid valve: There was trivial regurgitation. - Pulmonic valve: There was no significant regurgitation. - Pulmonary arteries: Systolic pressure was moderately to severely increased. PA peak pressure: 53 mm Hg (S).  Impressions:  - Normal LV systolic function with grade II diastolic dysfunction. Elevated right sided filling  pressures, consistent with pulmonary hypertension.  PETIMPRESSION9/2019: 1. The 2.4 by 0.8 cm apical segment right upper lobe nodule has persisted over the last month and has a maximum SUV of 2.9, highly suspicious for malignancy. No adenopathy or metastatic spread is identified. 2. Other imaging findings of potential clinical significance: Aortic Atherosclerosis (ICD10-I70.0) and Emphysema (ICD10-J43.9).  Coronary atherosclerosis. Airway thickening is present, suggesting bronchitis or reactive airways disease. Chronic bilateral maxillary sinusitis. Mild prostatomegaly. Aorta bi-iliac stent graft. Sigmoid colon diverticulosis.   Electronically Signed By: Van Clines M.D. On: 03/09/2018 08:51   CTA CHESTIMPRESSION8/2019: 1. Somewhat irregular nodular opacity in the posterior segment of the right upper lobe measuring 2.0 x 1.0 x 0.9 cm. Small neoplasm must be of concern. Advise correlation with nuclear medicine PET study to further assess.  2. No demonstrable pulmonary embolus. No thoracic aortic aneurysm or dissection. There are foci of aortic atherosclerosis as well as coronary artery and great vessel calcification.  3. Underlying centrilobular emphysematous change. Atelectasis evident in both lower lobes and posterior segment left upper lobe. No frank consolidation noted.  4. No evident thoracic adenopathy.  5. Adrenal hypertrophy bilaterally, more notable on the left than on the right.  6. Absent gallbladder.  These results will be called to the ordering clinician or representative by the Radiologist Assistant, and communication documented in the PACS or zVision Dashboard.  Aortic Atherosclerosis (ICD10-I70.0) and Emphysema (ICD10-J43.9).  Electronically Signed By: Lowella Grip III M.D. On: 02/04/2018 11:12   Myoview Study Highlights from 02/2016   Nuclear stress EF: 58%.  There was no ST segment deviation noted  during stress.  The study is normal.  This is a low risk study.     ASSESSMENT & PLAN:    1. HTN - BP a little high - would follow for now - had just smoked prior to coming in for this visit.   2. 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 He is not going to stop smoking.   3. HLD - on statin - labs from September noted.   4. Long standing tobacco - no intention of stopping.   5. Prior AAA/FEVAR repair with stenting of the left renal artery - followed by [facility] - not discussed.   6. Chronic diastolic dysfunction - he says his breathing is table.   7. CAD - noted on prior CT's - no active symptoms. Is on Plavix chronically. Ongoing tobacco. Would favor conservative management.   8. Knee pain/swelling - would favor conservative approach - would not favor surgical intervention given his multiple medical issues.   9. COVID-19 Education: The signs and symptoms of COVID-19 were discussed with the patient and how to seek care for testing (follow up with PCP or arrange E-visit).  The importance of social distancing, staying at home, hand hygiene and wearing a mask when out in public were discussed today.  Current medicines are reviewed with the patient today.  The patient does not have concerns regarding medicines other than what has been noted above.  The following changes have been made:  See above.  Labs/ tests ordered today include:    Orders Placed This Encounter  Procedures   EKG 12-Lead     Disposition:   FU with me in 6 month - overall prognosis tenuous at best.   Patient is agreeable to this plan and will call if any problems develop in the interim.   SignedTruitt Merle, NP  04/12/2019 9:51 AM  [facility] [address] [place], [place]  [address] Phone: [phone] Fax: [phone]

## 2019-04-07 NOTE — Telephone Encounter (Signed)
Patient aware of response - has called back this morning to relay that Onis is still "hardly able to walk."  States has new primary care provider appointment scheduled Monday,04/11/19, Dr Benny Lennert; will try calling there in case can be seen sooner. Also said may just go on to the Emergency room if unable to get to see primary care today.

## 2019-04-08 ENCOUNTER — Encounter: Payer: Self-pay | Admitting: Orthopedic Surgery

## 2019-04-08 ENCOUNTER — Ambulatory Visit: Payer: Medicare HMO | Admitting: Orthopedic Surgery

## 2019-04-08 VITALS — BP 133/59 | HR 71 | Ht 70.0 in | Wt 232.0 lb

## 2019-04-08 DIAGNOSIS — M25462 Effusion, left knee: Secondary | ICD-10-CM | POA: Diagnosis not present

## 2019-04-08 NOTE — Progress Notes (Signed)
Chief Complaint  Patient presents with  . Knee Pain    left / swollen pain behind knee    78 year old male with chronic intermittent knee effusions presents with a left knee effusion x1 week pain and swelling left knee moderate severity no fever  Review of systems again no fever no numbness or tingling left leg  BP (!) 133/59   Pulse 71   Ht 5\' 10"  (1.778 m)   Wt 232 lb (105.2 kg)   BMI 33.29 kg/m   Left knee is warm to touch no erythema.  He is tender medial lateral joint line and diffusely about the joint range of motion is limited to 80 degrees his knee is stable his muscle tone is normal he is neurovascularly intact he has minimal peripheral edema bilaterally  Acute effusion left knee  Encounter Diagnosis  Name Primary?  . Effusion of knee joint, left Yes     Aspiration injection left knee  Procedure note injection and aspiration left knee joint  Verbal consent was obtained to aspirate and inject the left knee joint   Timeout was completed to confirm the site of aspiration and injection  An 18-gauge needle was used to aspirate the left knee joint from a suprapatellar lateral approach.  The medications used were 40 mg of Depo-Medrol and 1% lidocaine 3 cc  Anesthesia was provided by ethyl chloride and the skin was prepped with alcohol.  After cleaning the skin with alcohol an 18-gauge needle was used to aspirate the right knee joint.  We obtained 40 cc of fluid clear  We followed this by injection of 40 mg of Depo-Medrol and 3 cc 1% lidocaine.  There were no complications. A sterile bandage was applied.  Follow-up 1 week

## 2019-04-11 ENCOUNTER — Other Ambulatory Visit: Payer: Self-pay

## 2019-04-11 ENCOUNTER — Ambulatory Visit: Payer: Medicare HMO

## 2019-04-11 ENCOUNTER — Ambulatory Visit: Payer: Medicare HMO | Admitting: Family Medicine

## 2019-04-11 ENCOUNTER — Telehealth: Payer: Self-pay | Admitting: Family Medicine

## 2019-04-11 ENCOUNTER — Ambulatory Visit: Payer: Medicare HMO | Admitting: Orthopedic Surgery

## 2019-04-11 NOTE — Telephone Encounter (Signed)
TM

## 2019-04-12 ENCOUNTER — Encounter: Payer: Self-pay | Admitting: Nurse Practitioner

## 2019-04-12 ENCOUNTER — Ambulatory Visit: Payer: Medicare HMO | Admitting: Nurse Practitioner

## 2019-04-12 VITALS — BP 144/68 | HR 75 | Ht 70.0 in | Wt 230.0 lb

## 2019-04-12 DIAGNOSIS — F17200 Nicotine dependence, unspecified, uncomplicated: Secondary | ICD-10-CM

## 2019-04-12 DIAGNOSIS — Z7189 Other specified counseling: Secondary | ICD-10-CM

## 2019-04-12 DIAGNOSIS — I5032 Chronic diastolic (congestive) heart failure: Secondary | ICD-10-CM | POA: Diagnosis not present

## 2019-04-12 DIAGNOSIS — E7800 Pure hypercholesterolemia, unspecified: Secondary | ICD-10-CM

## 2019-04-12 DIAGNOSIS — I10 Essential (primary) hypertension: Secondary | ICD-10-CM | POA: Diagnosis not present

## 2019-04-12 DIAGNOSIS — C3490 Malignant neoplasm of unspecified part of unspecified bronchus or lung: Secondary | ICD-10-CM | POA: Diagnosis not present

## 2019-04-12 NOTE — Patient Instructions (Addendum)

## 2019-04-14 DIAGNOSIS — H524 Presbyopia: Secondary | ICD-10-CM | POA: Diagnosis not present

## 2019-04-15 ENCOUNTER — Ambulatory Visit (INDEPENDENT_AMBULATORY_CARE_PROVIDER_SITE_OTHER): Payer: Medicare HMO | Admitting: Orthopedic Surgery

## 2019-04-15 ENCOUNTER — Other Ambulatory Visit: Payer: Self-pay

## 2019-04-15 VITALS — Ht 70.0 in | Wt 230.0 lb

## 2019-04-15 DIAGNOSIS — M25462 Effusion, left knee: Secondary | ICD-10-CM | POA: Diagnosis not present

## 2019-04-15 NOTE — Progress Notes (Signed)
Chief Complaint  Patient presents with  . Knee Pain    Recheck on left knee.    Procedure note injection and aspiration left knee joint  Verbal consent was obtained to aspirate and inject the left knee joint   Timeout was completed to confirm the site of aspiration and injection  An 18-gauge needle was used to aspirate the left knee joint from a suprapatellar lateral approach.  The medications used were 40 mg of Depo-Medrol and 1% lidocaine 3 cc  Anesthesia was provided by ethyl chloride and the skin was prepped with alcohol.  After cleaning the skin with alcohol an 18-gauge needle was used to aspirate the right knee joint.  We obtained 10  cc of fluid clear   We followed this by injection of 40 mg of Depo-Medrol and 3 cc 1% lidocaine.  There were no complications. A sterile bandage was applied.

## 2019-04-15 NOTE — Patient Instructions (Addendum)
You have received an injection of steroids into the joint. 15% of patients will have increased pain within the 24 hours postinjection.   This is transient and will go away.   We recommend that you use ice packs on the injection site for 20 minutes every 2 hours and extra strength Tylenol 2 tablets every 8 as needed until the pain resolves.  If you continue to have pain after taking the Tylenol and using the ice please call the office for further instructions.  Urologist at [facility], Dr Marveen Reeks or Dr Karsten Ro

## 2019-04-20 ENCOUNTER — Telehealth: Payer: Self-pay | Admitting: Orthopedic Surgery

## 2019-04-20 NOTE — Telephone Encounter (Signed)
He has appointment on Monday, my recollection was you wanted to see him every Friday. Since I get to decide, will work him in on Friday.

## 2019-04-20 NOTE — Telephone Encounter (Signed)
Called patient - moved appointment to Friday, 04/22/19(work-in); patient aware.

## 2019-04-20 NOTE — Telephone Encounter (Signed)
No advice we have already established what to do for this patient  I will let you go in your memory and do what we said   smh

## 2019-04-20 NOTE — Telephone Encounter (Signed)
Patient came into office in person this morning during clinic hours to request to see Dr Aline Brochure and to have an injection today. Relayed we will call patient back, as we were at capacity in waiting room at time he came by, and we are unable to schedule walk-in patient at this time. Patient is scheduled for appointment Monday, 04/25/19.  Please advise.

## 2019-04-21 ENCOUNTER — Telehealth: Payer: Self-pay | Admitting: Family Medicine

## 2019-04-21 NOTE — Telephone Encounter (Signed)
Routing to Dr. Holly Bodily & Rhae Hammock

## 2019-04-21 NOTE — Telephone Encounter (Signed)
Patient wife is calling in reference to the Kidney referral. She states she spoke with Dr. Holly Bodily about this and is calling to check on the status. I do not see a kidney referral in. Please advise.

## 2019-04-21 NOTE — Telephone Encounter (Signed)
I do not see a reference to a renal referral-I'm happy to make a referral but did not see a diagnosis. I think since the pt came for an acute concerns prior to new patient appt we may have missed some details on his chronic concerns

## 2019-04-22 ENCOUNTER — Other Ambulatory Visit: Payer: Self-pay

## 2019-04-22 ENCOUNTER — Other Ambulatory Visit: Payer: Self-pay | Admitting: Family Medicine

## 2019-04-22 ENCOUNTER — Ambulatory Visit (INDEPENDENT_AMBULATORY_CARE_PROVIDER_SITE_OTHER): Payer: Medicare HMO

## 2019-04-22 ENCOUNTER — Ambulatory Visit: Payer: Medicare HMO | Admitting: Orthopedic Surgery

## 2019-04-22 VITALS — BP 132/69 | HR 78 | Temp 97.1°F | Ht 70.0 in | Wt 230.0 lb

## 2019-04-22 DIAGNOSIS — J4540 Moderate persistent asthma, uncomplicated: Secondary | ICD-10-CM

## 2019-04-22 DIAGNOSIS — I10 Essential (primary) hypertension: Secondary | ICD-10-CM

## 2019-04-22 DIAGNOSIS — I5032 Chronic diastolic (congestive) heart failure: Secondary | ICD-10-CM

## 2019-04-22 DIAGNOSIS — M25462 Effusion, left knee: Secondary | ICD-10-CM | POA: Diagnosis not present

## 2019-04-22 NOTE — Progress Notes (Signed)
Chief Complaint  Patient presents with  . Follow-up    Recheck on left knee.     Patient is here for his routine once a week knee aspirations  His left knee effusion he thought he did not need any fluid drawn today but he still has the effusion is he has bilateral leg edema graph we aspirated his knee follow-up in a week Procedure note injection and aspiration left knee joint  Verbal consent was obtained to aspirate and inject the left knee joint   Timeout was completed to confirm the site of aspiration and injection  An 18-gauge needle was used to aspirate the left knee joint from a suprapatellar lateral approach.  The medications used were 40 mg of Depo-Medrol and 1% lidocaine 3 cc  Anesthesia was provided by ethyl chloride and the skin was prepped with alcohol.  After cleaning the skin with alcohol an 18-gauge needle was used to aspirate the right knee joint.  We obtained 25 cc of fluid cloudy yellow   We followed this by injection of 40 mg of Depo-Medrol and 3 cc 1% lidocaine.  There were no complications. A sterile bandage was applied.  Encounter Diagnosis  Name Primary?  . Effusion of knee joint, left Yes

## 2019-04-22 NOTE — Telephone Encounter (Signed)
completed

## 2019-04-22 NOTE — Telephone Encounter (Signed)
Please make patient a referral for renal care

## 2019-04-25 ENCOUNTER — Telehealth: Payer: Self-pay | Admitting: Nurse Practitioner

## 2019-04-25 ENCOUNTER — Ambulatory Visit: Payer: Medicare HMO | Admitting: Orthopedic Surgery

## 2019-04-25 ENCOUNTER — Other Ambulatory Visit (HOSPITAL_COMMUNITY): Payer: Self-pay | Admitting: *Deleted

## 2019-04-25 DIAGNOSIS — C3491 Malignant neoplasm of unspecified part of right bronchus or lung: Secondary | ICD-10-CM

## 2019-04-25 NOTE — Telephone Encounter (Signed)
S/w pt and pt's wife went over several different diagnoses on AVS.  Wife stated pt is not getting out of bed and knees are swollen, pt is getting fluid off of both knees once a week.  Pt is scheduled for another CT scan on Tuesday,  appt with Cecille Rubin is Wednesday.  Wife requested to be on speaker phone at visit.

## 2019-04-25 NOTE — Telephone Encounter (Signed)
New Message:    Wife called and said she would like to speak to Kevin Frank. She said she did not understand the diagnosis she wrote for the pt on his last visit 04-12-19.uto let

## 2019-04-26 ENCOUNTER — Inpatient Hospital Stay (HOSPITAL_COMMUNITY): Payer: Medicare HMO | Attending: Hematology

## 2019-04-26 ENCOUNTER — Ambulatory Visit (HOSPITAL_COMMUNITY)
Admission: RE | Admit: 2019-04-26 | Discharge: 2019-04-26 | Disposition: A | Discharge status: Home / Self Care | Payer: Medicare HMO | Source: Ambulatory Visit | Attending: Hematology | Admitting: Hematology

## 2019-04-26 ENCOUNTER — Other Ambulatory Visit: Payer: Self-pay

## 2019-04-26 DIAGNOSIS — C3491 Malignant neoplasm of unspecified part of right bronchus or lung: Secondary | ICD-10-CM | POA: Diagnosis not present

## 2019-04-26 LAB — COMPREHENSIVE METABOLIC PANEL
ALT: 19 U/L (ref 0–44)
AST: 15 U/L (ref 15–41)
Albumin: 3.7 g/dL (ref 3.5–5.0)
Alkaline Phosphatase: 91 U/L (ref 38–126)
Anion gap: 11 (ref 5–15)
BUN: 19 mg/dL (ref 8–23)
CO2: 25 mmol/L (ref 22–32)
Calcium: 9.2 mg/dL (ref 8.9–10.3)
Chloride: 101 mmol/L (ref 98–111)
Creatinine, Ser: 0.98 mg/dL (ref 0.61–1.24)
GFR calc Af Amer: >60 mL/min (ref >60)
GFR calc non Af Amer: >60 mL/min (ref >60)
Glucose, Bld: 124 mg/dL — ABNORMAL HIGH (ref 70–99)
Potassium: 3.9 mmol/L (ref 3.5–5.1)
Sodium: 137 mmol/L (ref 135–145)
Total Bilirubin: 0.6 mg/dL (ref 0.3–1.2)
Total Protein: 7.3 g/dL (ref 6.5–8.1)

## 2019-04-26 LAB — CBC WITH DIFFERENTIAL/PLATELET
Abs Immature Granulocytes: 0.04 10*3/uL (ref 0.00–0.07)
Basophils Absolute: 0.1 10*3/uL (ref 0.0–0.1)
Basophils Relative: 0 %
Eosinophils Absolute: 0.1 10*3/uL (ref 0.0–0.5)
Eosinophils Relative: 1 %
HCT: 47.2 % (ref 39.0–52.0)
Hemoglobin: 15.7 g/dL (ref 13.0–17.0)
Immature Granulocytes: 0 %
Lymphocytes Relative: 12 %
Lymphs Abs: 1.5 10*3/uL (ref 0.7–4.0)
MCH: 32.6 pg (ref 26.0–34.0)
MCHC: 33.3 g/dL (ref 30.0–36.0)
MCV: 97.9 fL (ref 80.0–100.0)
Monocytes Absolute: 0.9 10*3/uL (ref 0.1–1.0)
Monocytes Relative: 8 %
Neutro Abs: 9.2 10*3/uL — ABNORMAL HIGH (ref 1.7–7.7)
Neutrophils Relative %: 79 %
Platelets: 205 10*3/uL (ref 150–400)
RBC: 4.82 MIL/uL (ref 4.22–5.81)
RDW: 14.6 % (ref 11.5–15.5)
WBC: 11.7 10*3/uL — ABNORMAL HIGH (ref 4.0–10.5)
nRBC: 0 % (ref 0.0–0.2)

## 2019-04-26 LAB — LACTATE DEHYDROGENASE: LDH: 106 U/L (ref 98–192)

## 2019-04-26 MED ORDER — IOHEXOL 300 MG/ML  SOLN
75.0000 mL | Freq: Once | INTRAMUSCULAR | Status: AC | PRN
  Administered 2019-04-26: 75 mL via INTRAVENOUS

## 2019-04-26 NOTE — Progress Notes (Signed)
CARDIOLOGY OFFICE NOTE  Date:  04/27/2019    Kevin Frank Date of Birth: Nov 13, 1940 Medical Record #[id number]  PCP:  Kevin Hancock, MD  Cardiologist:  Kevin Frank    Chief Complaint  Patient presents with  . Follow-up    History of Present Illness: Kevin Frank is a 78 y.o. male who presents today for a work in visit. Seen for Dr. Burt Knack. He is a former patient of Dr. Susa Frank.Primarily follows with me.  He has a history of hypertension, hyperlipidemia, DM, ongoing tobacco abuse, COPD, and abdominal aortic aneurysm.On 04/09/2016 he underwent FEVARwith a single rightrenal fenestration by Dr. Trula Slade. He also had left renal angioplasty at the time of the main body deployment service to ensure that the graft not cover the renal artery because of angulation. The day following his procedure, his creatinine increased and ultrasound did not show good perfusion to the left kidney and therefore he went to the Cath Lab and had stenting of his left renal artery. His creatinine trended down and his urine output improved. The patient also had issues with left leg numbness in his legs after the procedure. Neurology was consulted. It was felt this was most likely secondary to lower back disease that was aggravated during surgery.  He continues to smoke cigarettes. He has not had any known CADsymptoms but has had prior CT scan showing left main and 3VD.He has been on chronic Plavix.He has had lung nodules noted as well.He had progressive shortness of breath last year - found to have lung cancer - has had XRT - felt to be poor candidate for surgical intervention - saw EBG.  Hehascontinued to smoke. Echo was updated and showed worsening pulmonary HTN.  We did a telehealth visit back in early June - he was doing ok. Had had follow up CT showing minimal decrease in his lung nodule. Cardiac status was ok. He was then seen just a couple of weeks ago here - having more  issues with fluid in his knees and walking. Still smoking. Had a new PCP.   Wife then called this week - was upset that she did not get to join the visit - but patient denied wanting his wife on the phone to listen into the visit. She was worried over several diagnoses on his AVS.   The patient does not have symptoms concerning for COVID-19 infection (fever, chills, cough, or new shortness of breath).   Comes in today. Here alone but have his wife on speak phone today. He does not know why he is here. He has no chest pain. He says his breathing is ok. His knees are still swelling - he has frequent aspirations. She is upset about diagnoses on his AVS from last visit - especially something that said "heart failure". She is quite worried since her mother (who I took care of too) had heart failure. He had a follow up CT yesterday for his lung cancer - this was stable. She notes that his knees are swelling, that he is cold, has issues with his shoulders, is wondering about his prior aneurysm, etc. He has no intention of stopping smoking - he has a pack of cigarettes in his shirt pocket today.   Past Medical History:  Diagnosis Date  . AAA (abdominal aortic aneurysm) ([place]) 01/2008   3.2cm  . Allergy   . Arthritis   . Asthma   . BPH (benign prostatic hyperplasia)   . Chronic knee pain  bilateral  . COPD (chronic obstructive pulmonary disease) ([place])   . Diabetes mellitus    type II  . Gout   . History of kidney stones   . Hypercholesterolemia   . Hyperlipidemia   . Hypertension   . Normal nuclear stress test 2011  . Obesity   . Obstructive sleep apnea on CPAP   . Presence of indwelling urinary catheter   . Shortness of breath    due to cigarette abuse  . Tobacco abuse     Past Surgical History:  Procedure Laterality Date  . ABDOMINAL AORTIC ENDOVASCULAR FENESTRATED STENT GRAFT N/A 04/09/2016   Procedure: ABDOMINAL AORTIC ENDOVASCULAR FENESTRATED STENT GRAFT with left brachial artery  access using ultrasound guidance;  Surgeon: Kevin Mitchell, MD;  Location: [facility];  Service: Vascular;  Laterality: N/A;  . [place]   [place]  . PERIPHERAL VASCULAR CATHETERIZATION N/A 04/10/2016   Procedure: Renal Angiography;  Surgeon: Angelia Mould, MD;  Location: [facility];  Service: Cardiovascular;  Laterality: N/A;  . TRANSURETHRAL RESECTION OF PROSTATE  01/07/2011   Procedure: TRANSURETHRAL RESECTION OF THE PROSTATE (TURP);  Surgeon: Kevin Frank;  Location: [facility];  Service: Urology;  Laterality: N/A;     Medications: Current Meds  Medication Sig  . allopurinol (ZYLOPRIM) 300 MG tablet Take 300 mg by mouth daily.    [name] amLODipine (NORVASC) 10 MG tablet TAKE 1 TABLET EVERY DAY  . atorvastatin (LIPITOR) 20 MG tablet Take 20 mg by mouth daily.   . clopidogrel (PLAVIX) 75 MG tablet TAKE (1) TABLET BY MOUTH ONCE DAILY.  [name] Fluticasone-Umeclidin-Vilant (TRELEGY ELLIPTA) 100-62.5-25 MCG/INH AEPB Inhale 1 puff into the lungs daily.  . hydrALAZINE (APRESOLINE) 50 MG tablet TAKE (1) TABLET TWICE DAILY.  [name] losartan (COZAAR) 100 MG tablet TAKE 1 TABLET (100 MG TOTAL) BY MOUTH DAILY.  . metFORMIN (GLUCOPHAGE) 500 MG tablet Take 1,000 mg by mouth 2 (two) times daily with a meal.   . metoprolol succinate (TOPROL-XL) 25 MG 24 hr tablet TAKE 1 TABLET EVERY DAY  . sertraline (ZOLOFT) 50 MG tablet Take 50 mg by mouth daily.    [name] torsemide (DEMADEX) 20 MG tablet Take 40 mg by mouth daily.    Current Facility-Administered Medications for the 04/27/19 encounter (Office Visit) with Burtis Junes, NP  Medication  . ipratropium (ATROVENT) nebulizer solution 0.5 mg  . levalbuterol (XOPENEX) nebulizer solution 1.25 mg  . omalizumab Kevin Frank) injection 375 mg     Allergies: Allergies  Allergen Reactions  . No Known Allergies     Social History: The patient  reports that he has been smoking cigarettes. He has a 150.00 pack-year smoking history. He has never used  smokeless tobacco. He reports current alcohol use. He reports that he does not use drugs.   Family History: The patient's family history includes Asthma in his daughter; Cancer in his mother; Diabetes in his mother; Hypertension in his mother; Liver disease in his father; Lymphoma in his mother; Mental retardation in an other family member.   Review of Systems: Please see the history of present illness.   All other systems are reviewed and negative.   Physical Exam: VS:  BP 140/64 (BP Location: Left Arm, Patient Position: Sitting, Cuff Size: Normal)   Pulse 83   Ht 5\' 10"  (1.778 m)   Wt 230 lb 12.8 oz (104.7 kg)   SpO2 93% Comment: at rest  BMI 33.12 kg/m  .  BMI Body mass index is 33.12  kg/m.  Wt Readings from Last 3 Encounters:  04/27/19 230 lb 12.8 oz (104.7 kg)  04/22/19 230 lb (104.3 kg)  04/15/19 230 lb (104.3 kg)    General: Alert and in no acute distress.  He smells heavily of tobacco. His weight is down 10 pounds from August of 2019.  HEENT: Normal.  Neck: Supple, no JVD, carotid bruits, or masses noted.  Cardiac: Heart tones are distant.  No significant edema.  Respiratory:  Decreased breath sounds noted.   GI: Soft and nontender.  MS: No deformity or atrophy. Gait and ROM intact.  Skin: Warm and dry. Color is normal.  Neuro:  Strength and sensation are intact and no gross focal deficits noted.  Psych: Alert, appropriate and with normal affect.   LABORATORY DATA:  EKG:  EKG is not ordered today.  Lab Results  Component Value Date   WBC 11.7 (H) 04/26/2019   HGB 15.7 04/26/2019   HCT 47.2 04/26/2019   PLT 205 04/26/2019   GLUCOSE 124 (H) 04/26/2019   CHOL 117 03/23/2019   TRIG 89 03/23/2019   HDL 39 03/23/2019   LDLCALC 61 03/23/2019   ALT 19 04/26/2019   AST 15 04/26/2019   NA 137 04/26/2019   K 3.9 04/26/2019   CL 101 04/26/2019   CREATININE 0.98 04/26/2019   BUN 19 04/26/2019   CO2 25 04/26/2019   PSA 2.0 10/09/2016   INR 0.96 03/19/2018    HGBA1C 6.0 (H) 04/02/2016     BNP (last 3 results) No results for input(s): BNP in the last 8760 hours.  ProBNP (last 3 results) No results for input(s): PROBNP in the last 8760 hours.   Other Studies Reviewed Today:  CT CHEST 04/2019 IMPRESSION: 1. Stable appearance of posterior Frank upper lobe lung lesion. 2. No new or progressive disease identified. 3. Emphysema and aortic atherosclerosis. Multi vessel coronary artery calcifications noted.  Aortic Atherosclerosis (ICD10-I70.0) and Emphysema (ICD10-J43.9).   Electronically Signed   By: Kerby Moors M.D.   On: 04/26/2019 11:27   EchoStudy Conclusions8/2019  - Left ventricle: The cavity size was normal. There was mild concentric hypertrophy. Systolic function was normal. The estimated ejection fraction was in the range of 55% to 60%. Wall motion was normal; there were no regional wall motion abnormalities. Features are consistent with a pseudonormal left ventricular filling pattern, with concomitant abnormal relaxation and increased filling pressure (grade 2 diastolic dysfunction). - Aortic valve: Sclerosis without stenosis. There was no regurgitation. - Mitral valve: There was trivial regurgitation. - Left atrium: The atrium was mildly dilated. - Frank ventricle: The cavity size was moderately dilated. Wall thickness was normal. Systolic function was normal. RV systolic pressure (S, est): 53 mm Hg. - Atrial septum: No defect or patent foramen ovale was identified. - Tricuspid valve: There was trivial regurgitation. - Pulmonic valve: There was no significant regurgitation. - Pulmonary arteries: Systolic pressure was moderately to severely increased. PA peak pressure: 53 mm Hg (S).  Impressions:  - Normal LV systolic function with grade II diastolic dysfunction. Elevated Frank sided filling pressures, consistent with pulmonary hypertension.   Myoview Study Highlights from  02/2016   Nuclear stress EF: 58%.  There was no ST segment deviation noted during stress.  The study is normal.  This is a low risk study.     ASSESSMENT &PLAN:    1. Multiple somatic issues - discussed via phone with wife - explained the diastolic dysfunction diagnosis - I do not think he has active heart  failure - he denies shortness of breath but has emphysema with ongoing tobacco - his weight is actually down over the past year - he likes salt as well. He has lots of arthritis issues - he would be high risk for any surgical procedure. He has no intention of changing his lifestyle/habits which could help - this is hard for her to accept.   2. HTN - BP fair - on multiple agents - would favor just following.   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   4. Long standing tobacco - he has no intention of stopping.  We just need to accept this.  5. HLD - on statin  6. Diastolic dysfunction on prior echo from 2019 - EF is normal - he likes his salt.   7. Prior AAA/FEVAR repair with stenting of the left renal artery - followed by [facility] - I do not feel this is an issue at this time.   8. CAD - presumed based on prior CT's - he has no active symptoms - he is on chronic Plavix - would favor conservative approach - he is not willing to make lifestyle changes.   9. Knee pain/swelling - would still favor conservative approach - would not favor surgical intervention given his multiple medical issues.   10. COVID-19 Education: The signs and symptoms of COVID-19 were discussed with the patient and how to seek care for testing (follow up with PCP or arrange E-visit).  The importance of social distancing, staying at home, hand hygiene and wearing a mask when out in public were discussed today.  Current medicines are reviewed with the patient today.  The patient does not have concerns regarding medicines other than what has been noted above.  The following changes have  been made:  See above.  Labs/ tests ordered today include:   No orders of the defined types were placed in this encounter.    Disposition:   FU with me as planned. His overall situation is tenuous - he is actually ok with his current status and understands his overall situation.   Patient is agreeable to this plan and will call if any problems develop in the interim.   SignedTruitt Merle, NP  04/27/2019 11:24 AM  [facility] [address] [place], [place]  [address] Phone: [phone] Fax: [phone]

## 2019-04-27 ENCOUNTER — Ambulatory Visit: Payer: Medicare HMO | Admitting: Nurse Practitioner

## 2019-04-27 ENCOUNTER — Other Ambulatory Visit: Payer: Self-pay

## 2019-04-27 ENCOUNTER — Encounter: Payer: Self-pay | Admitting: Nurse Practitioner

## 2019-04-27 ENCOUNTER — Other Ambulatory Visit: Payer: Self-pay | Admitting: *Deleted

## 2019-04-27 ENCOUNTER — Telehealth: Payer: Self-pay | Admitting: *Deleted

## 2019-04-27 VITALS — BP 140/64 | HR 83 | Ht 70.0 in | Wt 230.8 lb

## 2019-04-27 DIAGNOSIS — I5032 Chronic diastolic (congestive) heart failure: Secondary | ICD-10-CM

## 2019-04-27 DIAGNOSIS — F17200 Nicotine dependence, unspecified, uncomplicated: Secondary | ICD-10-CM | POA: Diagnosis not present

## 2019-04-27 DIAGNOSIS — I10 Essential (primary) hypertension: Secondary | ICD-10-CM | POA: Diagnosis not present

## 2019-04-27 DIAGNOSIS — Z7189 Other specified counseling: Secondary | ICD-10-CM

## 2019-04-27 DIAGNOSIS — E7800 Pure hypercholesterolemia, unspecified: Secondary | ICD-10-CM | POA: Diagnosis not present

## 2019-04-27 DIAGNOSIS — C3490 Malignant neoplasm of unspecified part of unspecified bronchus or lung: Secondary | ICD-10-CM | POA: Diagnosis not present

## 2019-04-27 DIAGNOSIS — I714 Abdominal aortic aneurysm, without rupture, unspecified: Secondary | ICD-10-CM

## 2019-04-27 NOTE — Patient Instructions (Addendum)
After Visit Summary:  We will be checking the following labs today - NONE   Medication Instructions:    Continue with your current medicines.   Go by this list you are given today and verify all your medicines when you get home.    If you need a refill on your cardiac medications before your next appointment, please call your pharmacy.     Testing/Procedures To Be Arranged:  N/A  Follow-Up:   See me back as planned.     At [facility], you and your health needs are our priority.  As part of our continuing mission to provide you with exceptional heart care, we have created designated Provider Care Teams.  These Care Teams include your primary Cardiologist (physician) and Advanced Practice Providers (APPs -  Physician Assistants and Nurse Practitioners) who all work together to provide you with the care you need, when you need it.  Special Instructions:  . Stay safe, stay home, wash your hands for at least 20 seconds and wear a mask when out in public.  . It was good to talk with you today.  . It is too risky for you to have surgery on your knees.   I am certain that you have blockage to your heart - we are treating this with medicines - I wish I could get you to stop smoking but I understand that that is not going to happen.   Try to use less salt - this helps you have less swelling.      Call the [facility] office at [phone] if you have any questions, problems or concerns.

## 2019-04-27 NOTE — Telephone Encounter (Signed)
Call from patient's wife that patient is having leg pain and swelling and shoulder pain. See note from Cardiology 04/27/2019 She claims this is progressively getting worse. I informed her I would be setting up non-invasive vascular studies and 1 year follow-up appointment ASAP with Dr. Elvina Sidle report to [facility] ER for worsening or acute condition. Expect a call back from this office with appt. Times.

## 2019-04-28 ENCOUNTER — Ambulatory Visit (HOSPITAL_COMMUNITY): Payer: Medicare HMO | Admitting: Hematology

## 2019-04-29 ENCOUNTER — Ambulatory Visit: Payer: Medicare HMO | Admitting: Orthopedic Surgery

## 2019-05-02 ENCOUNTER — Other Ambulatory Visit: Payer: Self-pay

## 2019-05-02 ENCOUNTER — Inpatient Hospital Stay (HOSPITAL_COMMUNITY): Payer: Medicare HMO | Admitting: Hematology

## 2019-05-02 ENCOUNTER — Encounter (HOSPITAL_COMMUNITY): Payer: Self-pay | Admitting: Hematology

## 2019-05-02 VITALS — BP 125/54 | HR 49 | Temp 97.7°F | Resp 20 | Wt 230.1 lb

## 2019-05-02 DIAGNOSIS — C3491 Malignant neoplasm of unspecified part of right bronchus or lung: Secondary | ICD-10-CM | POA: Diagnosis not present

## 2019-05-02 NOTE — Patient Instructions (Addendum)
[facility] at [facility] Discharge Instructions  You were seen today by Dr. Delton Coombes. He went over your recent lab results. He will schedule you for a repeat CT scan in 6 months. He will see you back in 6 months for labs and follow up.   Thank you for choosing [facility] at [facility] to provide your oncology and hematology care.  To afford each patient quality time with our provider, please arrive at least 15 minutes before your scheduled appointment time.   If you have a lab appointment with the [facility] please come in thru the  Main Entrance and check in at the main information desk  You need to re-schedule your appointment should you arrive 10 or more minutes late.  We strive to give you quality time with our providers, and arriving late affects you and other patients whose appointments are after yours.  Also, if you no show three or more times for appointments you may be dismissed from the clinic at the providers discretion.     Again, thank you for choosing [facility].  Our hope is that these requests will decrease the amount of time that you wait before being seen by our physicians.       _____________________________________________________________  Should you have questions after your visit to [facility], please contact our office at [phone] between the hours of 8:00 a.m. and 4:30 p.m.  Voicemails left after 4:00 p.m. will not be returned until the following business day.  For prescription refill requests, have your pharmacy contact our office and allow 72 hours.    Cancer Center Support Programs:   > Cancer Support Group  2nd Tuesday of the month 1pm-2pm, Journey Room
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
## 2019-05-03 ENCOUNTER — Ambulatory Visit (INDEPENDENT_AMBULATORY_CARE_PROVIDER_SITE_OTHER): Payer: Medicare HMO | Admitting: Orthopedic Surgery

## 2019-05-03 ENCOUNTER — Telehealth (HOSPITAL_COMMUNITY): Payer: Self-pay | Admitting: *Deleted

## 2019-05-03 VITALS — BP 150/69 | HR 63 | Temp 97.5°F | Ht 70.0 in | Wt 230.0 lb

## 2019-05-03 DIAGNOSIS — M25462 Effusion, left knee: Secondary | ICD-10-CM

## 2019-05-03 NOTE — Telephone Encounter (Signed)

## 2019-05-03 NOTE — Progress Notes (Signed)
Chief Complaint  Patient presents with  . Knee Pain    Recheck on left knee.    Montgomery comes in today for me to check his left knee he has fluid on the knee as expected we took off 22 cc of fluid it was clear we injected cortisone  Come back on the 20th or sooner if problems  Procedure note injection and aspiration left knee joint  Verbal consent was obtained to aspirate and inject the left knee joint   Timeout was completed to confirm the site of aspiration and injection  An 18-gauge needle was used to aspirate the left knee joint from a suprapatellar lateral approach.  The medications used were 40 mg of Depo-Medrol and 1% lidocaine 3 cc  Anesthesia was provided by ethyl chloride and the skin was prepped with alcohol.  After cleaning the skin with alcohol an 18-gauge needle was used to aspirate the right knee joint.  We obtained 22 cc of fluid clear   We followed this by injection of 40 mg of Depo-Medrol and 3 cc 1% lidocaine.  There were no complications. A sterile bandage was applied.

## 2019-05-04 ENCOUNTER — Ambulatory Visit (HOSPITAL_COMMUNITY)
Admission: RE | Admit: 2019-05-04 | Discharge: 2019-05-04 | Disposition: A | Discharge status: Home / Self Care | Payer: Medicare HMO | Source: Ambulatory Visit | Attending: Family | Admitting: Family

## 2019-05-04 ENCOUNTER — Ambulatory Visit (INDEPENDENT_AMBULATORY_CARE_PROVIDER_SITE_OTHER)
Admission: RE | Admit: 2019-05-04 | Discharge: 2019-05-04 | Disposition: A | Discharge status: Home / Self Care | Payer: Medicare HMO | Source: Ambulatory Visit | Attending: Family | Admitting: Family

## 2019-05-04 ENCOUNTER — Ambulatory Visit: Payer: Medicare HMO | Admitting: Allergy & Immunology

## 2019-05-04 ENCOUNTER — Other Ambulatory Visit: Payer: Self-pay

## 2019-05-04 DIAGNOSIS — I714 Abdominal aortic aneurysm, without rupture, unspecified: Secondary | ICD-10-CM

## 2019-05-06 ENCOUNTER — Ambulatory Visit: Payer: Self-pay

## 2019-05-09 ENCOUNTER — Ambulatory Visit: Payer: Medicare HMO | Admitting: Surgery

## 2019-05-09 DIAGNOSIS — Z0000 Encounter for general adult medical examination without abnormal findings: Secondary | ICD-10-CM | POA: Diagnosis not present

## 2019-05-11 ENCOUNTER — Ambulatory Visit (INDEPENDENT_AMBULATORY_CARE_PROVIDER_SITE_OTHER): Payer: Medicare HMO

## 2019-05-11 DIAGNOSIS — J4540 Moderate persistent asthma, uncomplicated: Secondary | ICD-10-CM

## 2019-05-13 ENCOUNTER — Ambulatory Visit (INDEPENDENT_AMBULATORY_CARE_PROVIDER_SITE_OTHER): Payer: Medicare HMO | Admitting: Orthopedic Surgery

## 2019-05-13 ENCOUNTER — Other Ambulatory Visit: Payer: Self-pay

## 2019-05-13 VITALS — BP 140/89 | HR 89 | Temp 97.2°F | Ht 70.0 in | Wt 230.0 lb

## 2019-05-13 DIAGNOSIS — M25462 Effusion, left knee: Secondary | ICD-10-CM

## 2019-05-13 MED ORDER — PREDNISONE 10 MG PO TABS: 10.0000 mg | ORAL_TABLET | Freq: Two times a day (BID) | ORAL | 0 refills | Status: DC

## 2019-05-13 NOTE — Patient Instructions (Signed)

## 2019-05-13 NOTE — Progress Notes (Signed)
Chief complaint recheck left knee for swelling  Kevin Frank says he has improved in terms of his knee he did have some pain in his hip took some prednisone that he had at home and that seemed to resolve  Does not complain of pain in his knee today  Review of systems no fever swelling or erythema no rashes are noted around the knee joint  Physical Exam Vitals signs and nursing note reviewed.  Constitutional:      Appearance: Normal appearance.  Musculoskeletal:     Comments: Left knee has a small effusion skin looks good range of motion is excellent knee is stable muscle tone and strength are normal  Neurological:     Mental Status: He is alert and oriented to person, place, and time.  Psychiatric:        Mood and Affect: Mood normal.    Encounter Diagnosis  Name Primary?  . Effusion of knee joint, left Yes    I tapped the knee again and got back 17 cc of clear fluid  It seems that serially tapping the knee is improving his symptoms although he continues to have the swelling  I also gave him prescription for prednisone  Follow-up 2 weeks  Procedure note injection and aspiration left knee joint  Verbal consent was obtained to aspirate and inject the left knee joint   Timeout was completed to confirm the site of aspiration and injection  An 18-gauge needle was used to aspirate the left knee joint from a suprapatellar lateral approach.  The medications used were 40 mg of Depo-Medrol and 1% lidocaine 3 cc  Anesthesia was provided by ethyl chloride and the skin was prepped with alcohol.  After cleaning the skin with alcohol an 18-gauge needle was used to aspirate the right knee joint.  We obtained 15 cc of fluid clear yellow  We followed this by injection of 40 mg of Depo-Medrol and 3 cc 1% lidocaine.  There were no complications. A sterile bandage was applied.

## 2019-05-16 ENCOUNTER — Other Ambulatory Visit: Payer: Self-pay | Admitting: Cardiovascular Disease

## 2019-05-16 DIAGNOSIS — Z1211 Encounter for screening for malignant neoplasm of colon: Secondary | ICD-10-CM | POA: Diagnosis not present

## 2019-05-16 DIAGNOSIS — Z1212 Encounter for screening for malignant neoplasm of rectum: Secondary | ICD-10-CM | POA: Diagnosis not present

## 2019-05-18 LAB — GALACTOSE-ALPHA-1,3-GALACTOSE IGE: Galactose-alpha-1,3-galactose IgE: 46.9

## 2019-05-25 ENCOUNTER — Ambulatory Visit (INDEPENDENT_AMBULATORY_CARE_PROVIDER_SITE_OTHER): Payer: Medicare HMO

## 2019-05-25 DIAGNOSIS — J4540 Moderate persistent asthma, uncomplicated: Secondary | ICD-10-CM | POA: Diagnosis not present

## 2019-05-26 ENCOUNTER — Encounter: Payer: Self-pay | Admitting: Internal Medicine

## 2019-05-27 ENCOUNTER — Encounter: Payer: Self-pay | Admitting: Orthopedic Surgery

## 2019-05-27 ENCOUNTER — Other Ambulatory Visit: Payer: Self-pay

## 2019-05-27 ENCOUNTER — Ambulatory Visit (INDEPENDENT_AMBULATORY_CARE_PROVIDER_SITE_OTHER): Payer: Medicare HMO | Admitting: Orthopedic Surgery

## 2019-05-27 ENCOUNTER — Ambulatory Visit: Payer: Self-pay

## 2019-05-27 VITALS — BP 135/72 | HR 83 | Ht 70.0 in | Wt 230.0 lb

## 2019-05-27 DIAGNOSIS — M25462 Effusion, left knee: Secondary | ICD-10-CM | POA: Diagnosis not present

## 2019-05-27 DIAGNOSIS — M25461 Effusion, right knee: Secondary | ICD-10-CM

## 2019-05-27 NOTE — Progress Notes (Signed)
Chief Complaint  Patient presents with  . Knee Pain    left greater than right     Bilateral knee pain left greater than right  Most of the pain is in the back of the knee no pain in the front of the knee  Examination reveals tenderness in the back of the knee and pain with knee extension bilaterally  I aspirated left and right knee follow-up in 1 week  Procedure note injection and aspiration left knee joint  Verbal consent was obtained to aspirate and inject the left knee joint   Timeout was completed to confirm the site of aspiration and injection  An 18-gauge needle was used to aspirate the left knee joint from a suprapatellar lateral approach.  The medications used were 40 mg of Depo-Medrol and 1% lidocaine 3 cc  Anesthesia was provided by ethyl chloride and the skin was prepped with alcohol.  After cleaning the skin with alcohol an 18-gauge needle was used to aspirate the right knee joint.  We obtained 40 cc of fluid clear yellow   We followed this by injection of 40 mg of Depo-Medrol and 3 cc 1% lidocaine.  There were no complications. A sterile bandage was applied. Procedure note injection and aspiration right knee joint  Verbal consent was obtained to aspirate and inject the right knee joint   Timeout was completed to confirm the site of aspiration and injection  An 18-gauge needle was used to aspirate the knee joint from a suprapatellar lateral approach.  The medications used were 40 mg of Depo-Medrol and 1% lidocaine 3 cc  Anesthesia was provided by ethyl chloride and the skin was prepped with alcohol.  After cleaning the skin with alcohol an 18-gauge needle was used to aspirate the right knee joint.  We obtained 30   cc of fluid clear yellow   We follow this by injection of 40 mg of Depo-Medrol and 3 cc 1% lidocaine.  There were no complications. A sterile bandage was applied.  Encounter Diagnoses  Name Primary?  . Effusion of knee joint, left Yes  .  Effusion of right knee    F/u 1 week
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
## 2019-06-01 ENCOUNTER — Encounter: Payer: Self-pay | Admitting: Allergy & Immunology

## 2019-06-01 ENCOUNTER — Ambulatory Visit (INDEPENDENT_AMBULATORY_CARE_PROVIDER_SITE_OTHER): Payer: Medicare HMO | Admitting: Allergy & Immunology

## 2019-06-01 ENCOUNTER — Other Ambulatory Visit: Payer: Self-pay

## 2019-06-01 VITALS — BP 116/60 | HR 78 | Temp 97.6°F | Resp 18 | Ht 70.0 in

## 2019-06-01 DIAGNOSIS — H6123 Impacted cerumen, bilateral: Secondary | ICD-10-CM

## 2019-06-01 DIAGNOSIS — J449 Chronic obstructive pulmonary disease, unspecified: Secondary | ICD-10-CM

## 2019-06-01 DIAGNOSIS — F17200 Nicotine dependence, unspecified, uncomplicated: Secondary | ICD-10-CM

## 2019-06-01 DIAGNOSIS — J3089 Other allergic rhinitis: Secondary | ICD-10-CM

## 2019-06-01 DIAGNOSIS — J302 Other seasonal allergic rhinitis: Secondary | ICD-10-CM

## 2019-06-01 MED ORDER — ALBUTEROL SULFATE HFA 108 (90 BASE) MCG/ACT IN AERS: INHALATION_SPRAY | RESPIRATORY_TRACT | 2 refills | Status: DC

## 2019-06-01 MED ORDER — TRELEGY ELLIPTA 100-62.5-25 MCG/INH IN AEPB: 1.0000 | INHALATION_SPRAY | Freq: Every day | RESPIRATORY_TRACT | 1 refills | Status: DC

## 2019-06-01 NOTE — Patient Instructions (Addendum)
1. Asthma-COPD overlap syndrome - Lung testing deferred.  - We are not going to make any medication changes at this time.  - Daily controller medication(s): Trelegy 100/62.5/25 one puff once daily + Xolair every two weeks - Prior to physical activity: ProAir 2 puffs 10-15 minutes before physical activity. - Rescue medications: ProAir 4 puffs every 4-6 hours as needed or albuterol nebulizer one vial every 4-6 hours as needed - Asthma control goals:  * Full participation in all desired activities (may need albuterol before activity) * Albuterol use two time or less a week on average (not counting use with activity) * Cough interfering with sleep two time or less a month * Oral steroids no more than once a year * No hospitalizations  2. Chronic rhinitis (cat, dog, cockroach, trees, weeds, and ragweed) - Continue with nasal saline rinses 1-2 times daily to try to heal your nose and prevent more bleeds. - Use an antihistamine as needed (Allegra, Zyrtec, Claritin).   3. Bilateral cerumen impaction - Lots of wax was removed today. - Use olive oil drops a few times a week to lubricate the canals.   4. Return in about 6 months (around 11/30/2019).   Please inform us of any Emergency Department visits, hospitalizations, or changes in symptoms. Call us before going to the ED for breathing or allergy symptoms since we might be able to fit you in for a sick visit. Feel free to contact us anytime with any questions, problems, or concerns.  It was a pleasure to see you again today!   Websites that have reliable patient information: 1. American Academy of Asthma, Allergy, and Immunology: www.aaaai.org 2. Food Allergy Research and Education (FARE): foodallergy.org 3. Mothers of Asthmatics: http://www.asthmacommunitynetwork.org 4. American College of Allergy, Asthma, and Immunology: www.acaai.org

## 2019-06-01 NOTE — Progress Notes (Signed)
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 Asthma-COPD overlap syndrome - Lung testing deferred.  - We are not going to make any medication changes at this time.  - Daily controller medication(s): Trelegy 100/62.5/25 one puff once daily + Xolair every two weeks - Prior to physical activity: ProAir 2 puffs 10-15 minutes before physical activity. - Rescue medications: ProAir 4 puffs every 4-6 hours as needed or albuterol nebulizer one vial every 4-6 hours as needed - Asthma control goals:  * Full participation in all desired activities (may need albuterol before activity) * Albuterol use two time or less a week on average (not counting use with activity) * Cough interfering with sleep two time or less a month * Oral steroids no more than once a year * No hospitalizations  2. Chronic rhinitis (cat, dog, cockroach, trees, weeds, and ragweed) - Continue with nasal saline rinses 1-2 times daily to try to heal your nose and prevent more bleeds. - Use an antihistamine as needed (Allegra, Zyrtec, Claritin).   3. Bilateral cerumen impaction - Lots of wax was removed today. - Use olive oil drops a few times a week to lubricate the canals.   4. Return in about 6 months (around 11/30/2019).  Subjective:   Kevin Frank is a 78 y.o. male presenting today for follow up of  Chief Complaint  Patient presents with  . Asthma    Kevin Frank has a history of the following: Patient Active Problem List   Diagnosis Date Noted  . Squamous cell carcinoma of right lung ([place]) 03/29/2018  . Seasonal and perennial allergic rhinitis  09/22/2017  . COPD with asthma ([place]) 09/22/2017  . Obstructive sleep apnea on CPAP 04/23/2017  . Chronic knee pain 04/23/2017  . AKI (acute kidney injury) ([place]) 04/23/2017  . Chronic diastolic CHF (congestive heart failure) ([place]) 04/23/2017  . Cellulitis 04/23/2017  . Cellulitis of left lower leg   . Smoking greater than 40 pack years 03/05/2016  . Incidental lung nodule, > 80mm and < 31mm 03/05/2016  . Stage 2 moderate COPD by GOLD classification ([place]) 03/05/2016  . AAA (abdominal aortic aneurysm) without rupture ([place]) 06/14/2015  . OBSTRUCTIVE SLEEP APNEA 06/09/2007  . HYPERCHOLESTEROLEMIA 05/06/2007  . GOUT 05/06/2007  . DEPRESSION 05/06/2007  . Hypertension, essential 05/06/2007  . ABDOMINAL AORTIC ANEURYSM 05/06/2007  . BENIGN PROSTATIC HYPERTROPHY, HX OF 05/06/2007    History obtained from: chart review and patient.  Kevin Frank is a 78 y.o. male presenting for a follow up visit. He is well known to our practice. He was last seen in August 2020. At that time, he was doing very well. We did do lung testing and it was better than ever. We continued with Trelegy one puff once daily as well as albuterol as needed. For his rhinitis, we continued with nasal saline rinses as well as an OTC antihistamine as needed.  Since the last visit, he has done well. His wife was recently admitted to the hospital with pneumonia. [facility] is at [facility].   Asthma/Respiratory Symptom History: He remains on the Trelegy one puff once daily. He is on the Xolair every two weeks as well, which is working  well to control his breathing. He has not needed any prednisone at all. He has not been to the hospital and has had no major breathing issues in a while. He was having some problems with chest pain after breaking some ribs, but this has since healed and he reports feeling better following that injury. He does continue to smoke.   Allergic Rhinitis Symptom History: He remains on his OTC antihistamine. He has  not required the use of antibiotics at all.   Otherwise, there have been no changes to his past medical history, surgical history, family history, or social history.    Review of Systems  Constitutional: Negative.  Negative for chills, fever, malaise/fatigue and weight loss.  HENT: Negative.  Negative for congestion, ear discharge, ear pain and sore throat.   Eyes: Negative for pain, discharge and redness.  Respiratory: Negative for cough, sputum production, shortness of breath and wheezing.   Cardiovascular: Negative.  Negative for chest pain and palpitations.  Gastrointestinal: Negative for abdominal pain, constipation, diarrhea, heartburn, nausea and vomiting.  Skin: Negative.  Negative for itching and rash.  Neurological: Negative for dizziness and headaches.  Endo/Heme/Allergies: Negative for environmental allergies. Does not bruise/bleed easily.       Objective:   Blood pressure 116/60, pulse 78, temperature 97.6 F (36.4 C), temperature source Temporal, resp. rate 18, height 5\' 10"  (1.778 m), SpO2 98 %. Body mass index is 33 kg/m.   Physical Exam:  Physical Exam  Constitutional: He appears well-developed.  Kevin Frank male. Talkative. Hard of hearing.   HENT:  Head: Normocephalic and atraumatic.  Right Ear: Tympanic membrane, external ear and ear canal normal.  Left Ear: Tympanic membrane, external ear and ear canal normal.  Nose: No mucosal edema, rhinorrhea, nasal deformity or septal deviation. No epistaxis. Right sinus exhibits no maxillary sinus tenderness and no frontal sinus tenderness. Left sinus exhibits no maxillary sinus tenderness and no frontal sinus tenderness.  Mouth/Throat: Uvula is midline and oropharynx is clear and moist. Mucous membranes are not pale and not dry.  There was marked bilateral cerumen impaction. We did remove this via instrumentation in the office setting. He also required warm water rinses to remove the cerumen. He tolerated this well  without a problem.   Eyes: Pupils are equal, round, and reactive to light. Conjunctivae and EOM are normal. Right eye exhibits no chemosis and no discharge. Left eye exhibits no chemosis and no discharge. Right conjunctiva is not injected. Left conjunctiva is not injected.  No allergic shiners noted.   Cardiovascular: Normal rate, regular rhythm and normal heart sounds.  Respiratory: Effort normal and breath sounds normal. No accessory muscle usage. No tachypnea. No respiratory distress. He has no wheezes. He has no rhonchi. He has no rales. He exhibits no tenderness.  Moving air well in all lung fields. No increased work of breathing noted. No crackles noted.   Lymphadenopathy:    He has no cervical adenopathy.  Neurological: He is alert.  Skin: No abrasion, no petechiae and no rash noted. Rash is not papular, not vesicular and not urticarial. No erythema. No pallor.  No eczematous or urticarial lesions noted.   Psychiatric: He has a normal mood and affect.     Diagnostic studies: none  Kevin Frank has cerumen removed from his bilateral ears with instrumentation. He did have flushing of both external auditory canals with good results. There are no bleeding whatsoever. He tolerated the procedure will without problems.    Salvatore Marvel, MD  [facility]

## 2019-06-01 NOTE — Progress Notes (Signed)
Patient had bilateral ear irrigation procedure. Tolerated procedure well with out any complaints of pain, discomfort, tenderness or ringing in the ears. Patient was evaluated by Dr. Ernst Bowler and noted that he was stable and in good standing.

## 2019-06-03 ENCOUNTER — Ambulatory Visit: Payer: Medicare HMO | Admitting: Orthopedic Surgery

## 2019-06-03 ENCOUNTER — Encounter: Payer: Self-pay | Admitting: Orthopedic Surgery

## 2019-06-06 ENCOUNTER — Encounter: Payer: Self-pay | Admitting: Surgery

## 2019-06-06 ENCOUNTER — Ambulatory Visit (INDEPENDENT_AMBULATORY_CARE_PROVIDER_SITE_OTHER): Payer: Medicare HMO | Admitting: Surgery

## 2019-06-06 ENCOUNTER — Other Ambulatory Visit: Payer: Self-pay

## 2019-06-06 DIAGNOSIS — I714 Abdominal aortic aneurysm, without rupture, unspecified: Secondary | ICD-10-CM

## 2019-06-06 NOTE — Progress Notes (Signed)
[facility] [place]  Patient name: Kevin Frank MRN: [id number] DOB: 1941/02/07 Sex: male      Virtual Visit via Telephone Note   This visit type was conducted due to national recommendations for restrictions regarding the COVID-19 Pandemic (e.g. social distancing) in an effort to limit this patient's exposure and mitigate transmission in our community.  Due to his co-morbid illnesses, this patient is at least at moderate risk for complications without adequate follow up.  This format is felt to be most appropriate for this patient at this time.  The patient did not have access to video technology/had technical difficulties with video requiring transitioning to audio format only (telephone).  All issues noted in this document were discussed and addressed.  No physical exam could be performed with this format.    Patient Location: Home Provider Location: Office    REASON FOR APPOINTMENT:    Follow up  HISTORY OF PRESENT ILLNESS:    TAIT BALISTRERI a [phone].[email or web address].malereturns today for his first postoperative follow-up. On 04/09/2016 he underwent FEVARwith a single rightrenal fenestration. I also performed left renal angioplasty at the time of the main body deployment service to ensure that the graft not cover the renal artery because of angulation. The day following his procedure, his creatinine increased and ultrasound did not show good perfusion to the left kidney and therefore he went to the Cath Lab and had stenting of his left renal artery. His creatinine trended down and his urine output improved.   The patient does not have symptoms concerning for COVID-19 infection (fever, chills, cough, or new shortness of breath).   PAST MEDICAL HISTORY    Past Medical History:  Diagnosis Date  . AAA (abdominal aortic aneurysm) ([place]) 01/2008   3.2cm  . Allergy   . Arthritis   . Asthma   . BPH (benign prostatic hyperplasia)   . Chronic knee  pain    bilateral  . COPD (chronic obstructive pulmonary disease) ([place])   . Diabetes mellitus    type II  . Gout   . History of kidney stones   . Hypercholesterolemia   . Hyperlipidemia   . Hypertension   . Normal nuclear stress test 2011  . Obesity   . Obstructive sleep apnea on CPAP   . Presence of indwelling urinary catheter   . Shortness of breath    due to cigarette abuse  . Tobacco abuse      FAMILY HISTORY   Family History  Problem Relation Age of Onset  . Liver disease Father   . Lymphoma Mother   . Diabetes Mother   . Hypertension Mother   . Cancer Mother   . Mental retardation Other        sibling -- from birth   . Asthma Daughter   . Allergic rhinitis Neg Hx   . Angioedema Neg Hx   . Atopy Neg Hx   . Eczema Neg Hx   . Immunodeficiency Neg Hx     SOCIAL HISTORY:   Social History   Socioeconomic History  . Marital status: Married    Spouse name: Not on file  . Number of children: Not on file  . Years of education: Not on file  . Highest education level: Not on file  Occupational History  . Not on file  Tobacco Use  . Smoking status: Current Every Day Smoker    Packs/day: 3.00  Years: 50.00    Pack years: 150.00    Types: Cigarettes  . Smokeless tobacco: Never Used  . Tobacco comment: 2ppd 10/30/2016 ee  Substance and Sexual Activity  . Alcohol use: Yes    Alcohol/week: 0.0 standard drinks    Comment: very little  . Drug use: No  . Sexual activity: Not on file  Other Topics Concern  . Not on file  Social History Narrative  . Not on file   Social Determinants of Health   Financial Resource Strain:   . Difficulty of Paying Living Expenses: Not on file  Food Insecurity:   . Worried About Charity fundraiser in the Last Year: Not on file  . Ran Out of Food in the Last Year: Not on file  Transportation Needs:   . Lack of Transportation (Medical): Not on file  . Lack of Transportation (Non-Medical): Not on file  Physical Activity:     . Days of Exercise per Week: Not on file  . Minutes of Exercise per Session: Not on file  Stress:   . Feeling of Stress : Not on file  Social Connections:   . Frequency of Communication with Friends and Family: Not on file  . Frequency of Social Gatherings with Friends and Family: Not on file  . Attends Religious Services: Not on file  . Active Member of Clubs or Organizations: Not on file  . Attends Archivist Meetings: Not on file  . Marital Status: Not on file  Intimate Partner Violence:   . Fear of Current or Ex-Partner: Not on file  . Emotionally Abused: Not on file  . Physically Abused: Not on file  . Sexually Abused: Not on file    ALLERGIES:    Allergies  Allergen Reactions  . No Known Allergies     CURRENT MEDICATIONS:    Current Outpatient Medications  Medication Sig Dispense Refill  . albuterol (VENTOLIN HFA) 108 (90 Base) MCG/ACT inhaler Use 4 puffs every 4-6 hours as needed for cough or wheeze 18 g 2  . allopurinol (ZYLOPRIM) 300 MG tablet Take 300 mg by mouth daily.      [name] amLODipine (NORVASC) 10 MG tablet TAKE 1 TABLET EVERY DAY 90 tablet 3  . atorvastatin (LIPITOR) 20 MG tablet Take 20 mg by mouth daily.     [name] azelastine (ASTELIN) 0.1 % nasal spray Place 1 spray into both nostrils 2 (two) times daily. Use in each nostril as directed    . clopidogrel (PLAVIX) 75 MG tablet TAKE (1) TABLET BY MOUTH ONCE DAILY. 30 tablet 0  . colchicine 0.6 MG tablet Take 1 tablet (0.6 mg total) by mouth 2 (two) times daily. 14 tablet 5  . Fluticasone-Umeclidin-Vilant (TRELEGY ELLIPTA) 100-62.5-25 MCG/INH AEPB Inhale 1 puff into the lungs daily. 180 each 1  . gabapentin (NEURONTIN) 100 MG capsule Take 1 capsule (100 mg total) by mouth 3 (three) times daily. 90 capsule 2  . hydrALAZINE (APRESOLINE) 50 MG tablet TAKE (1) TABLET TWICE DAILY. 180 tablet 3  . HYDROcodone-acetaminophen (NORCO/VICODIN) 5-325 MG tablet Take 1 tablet by mouth every 6 (six) hours as needed for  moderate pain. 30 tablet 0  . metFORMIN (GLUCOPHAGE) 500 MG tablet Take 1,000 mg by mouth 2 (two) times daily with a meal.     . methocarbamol (ROBAXIN) 500 MG tablet Take 1 tablet (500 mg total) by mouth 3 (three) times daily. 60 tablet 1  . metoprolol succinate (TOPROL-XL) 25 MG 24 hr tablet TAKE 1  TABLET EVERY DAY 90 tablet 3  . omalizumab (XOLAIR) 150 MG injection Inject 375 mg into the skin every 14 (fourteen) days. 6 each 11  . predniSONE (DELTASONE) 10 MG tablet Take 1 tablet (10 mg total) by mouth 2 (two) times daily with a meal. 60 tablet 0  . sertraline (ZOLOFT) 50 MG tablet Take 50 mg by mouth daily.      [name] torsemide (DEMADEX) 20 MG tablet Take 40 mg by mouth daily.     . traMADol (ULTRAM) 50 MG tablet     . losartan (COZAAR) 100 MG tablet TAKE 1 TABLET (100 MG TOTAL) BY MOUTH DAILY. 90 tablet 2   Current Facility-Administered Medications  Medication Dose Route Frequency Provider Last Rate Last Admin  . ipratropium (ATROVENT) nebulizer solution 0.5 mg  0.5 mg Nebulization Once Gean Quint, MD      . levalbuterol Penne Lash) nebulizer solution 1.25 mg  1.25 mg Nebulization Once Gean Quint, MD      . omalizumab Arvid Right) injection 375 mg  375 mg Subcutaneous Q14 Days Valentina Shaggy, MD   375 mg at 05/25/19 1035    REVIEW OF SYSTEMS:   Please see the history of present illness.     All other systems reviewed and are negative.  PHYSICAL EXAM:    GENERAL: The patient is a well-nourished male, in no acute distress. The vital signs are documented above. PULMONARY: Nonlabored breathing PSYCHIATRIC: The patient has a normal affect.   Recent Labs: 03/23/2019: TSH 1.15 04/26/2019: ALT 19; BUN 19; Creatinine, Ser 0.98; Hemoglobin 15.7; Platelets 205; Potassium 3.9; Sodium 137   Recent Lipid Panel Lab Results  Component Value Date/Time   CHOL 117 03/23/2019 12:00 AM   TRIG 89 03/23/2019 12:00 AM   HDL 39 03/23/2019 12:00 AM   CHOLHDL 3 12/16/2010 08:54 AM    LDLCALC 61 03/23/2019 12:00 AM    Wt Readings from Last 3 Encounters:  05/27/19 104.3 kg  05/13/19 104.3 kg  05/03/19 104.3 kg     STUDIES:   I have reviewed the following: AAA: Abdominal Aorta: Previous diameter measurement was 4.0 x 3.50 cm obtained on 02/15/2018. Todays measurement 3.90 cms. Suboptimal, limited exam. Further imaging modalities may be waranted.  RENAL: Bilateral: The renal arteries appear patent, limited visualization.            Unable to visualize stents. An enlarged right renal cyst            noted measuring 6.29 x 6.55 cms. Further imaging            modalities may be warranted.     ASSESSMENT and PLAN   AAA:   Aneurysm has decreased to 3.9 cm.  Renal stents were not well visualized.   Recent creatinine was normal.  I will get a CTA in 1 year for better visualization of his endograft  stancing was discussed today.  Time:   Today, I have spent 15 minutes with the patient with telehealth technology discussing the above problems.     Leia Alf, MD, FACS [facility] [phone] Pager [phone]

## 2019-06-08 ENCOUNTER — Ambulatory Visit (INDEPENDENT_AMBULATORY_CARE_PROVIDER_SITE_OTHER): Payer: Medicare HMO | Admitting: *Deleted

## 2019-06-08 ENCOUNTER — Other Ambulatory Visit: Payer: Self-pay

## 2019-06-08 DIAGNOSIS — J4540 Moderate persistent asthma, uncomplicated: Secondary | ICD-10-CM | POA: Diagnosis not present

## 2019-06-13 ENCOUNTER — Other Ambulatory Visit: Payer: Self-pay

## 2019-06-13 ENCOUNTER — Ambulatory Visit: Payer: Medicare HMO | Admitting: Orthopedic Surgery

## 2019-06-13 VITALS — BP 140/60 | HR 87 | Temp 97.0°F | Ht 70.0 in | Wt 230.0 lb

## 2019-06-13 DIAGNOSIS — M25461 Effusion, right knee: Secondary | ICD-10-CM

## 2019-06-13 DIAGNOSIS — M7551 Bursitis of right shoulder: Secondary | ICD-10-CM | POA: Diagnosis not present

## 2019-06-13 DIAGNOSIS — M25462 Effusion, left knee: Secondary | ICD-10-CM

## 2019-06-13 NOTE — Progress Notes (Signed)
Chief Complaint  Patient presents with  . Follow-up    Recheck on left knee.    78 year old male check knee every week or 2 but today he is having pain in his shoulder about 4 days of pain and decreased range of motion no trauma  Review of systems no numbness or tingling in the right upper extremity  Exam shows  BP 140/60   Pulse 87   Temp (!) 97 F (36.1 C)   Ht 5\' 10"  (1.778 m)   Wt 230 lb (104.3 kg)   BMI 33.00 kg/m   Appearance is normal he is oriented x3 mood and affect are pleasant  Neurovascular exam is intact Physical Exam Musculoskeletal:     Right shoulder: Tenderness present. No swelling, deformity, effusion, laceration or crepitus. Decreased range of motion. Normal strength. Normal pulse.       Arms:     Right knee: No swelling or effusion. No tenderness.     Left knee: No swelling or effusion. No tenderness.     Comments: Positive impingement sign    Acute shoulder pain and bursitis  Encounter Diagnoses  Name Primary?  . Effusion of knee joint, left Yes  . Effusion of right knee   . Bursitis of right shoulder    Knees look quiet today no need for injection or aspiration   Inject right shoulder subacromial space   Procedure note the subacromial injection shoulder RIGHT    Verbal consent was obtained to inject the  RIGHT   Shoulder  Timeout was completed to confirm the injection site is a subacromial space of the  RIGHT  shoulder   Medication used Depo-Medrol 40 mg and lidocaine 1% 3 cc  Anesthesia was provided by ethyl chloride  The injection was performed in the RIGHT  posterior subacromial space. After pinning the skin with alcohol and anesthetized the skin with ethyl chloride the subacromial space was injected using a 20-gauge needle. There were no complications  Sterile dressing was applied.   Follow-up January 8 knee and shoulder

## 2019-06-14 ENCOUNTER — Encounter: Payer: Self-pay | Admitting: Gastroenterology

## 2019-06-14 ENCOUNTER — Other Ambulatory Visit: Payer: Self-pay

## 2019-06-14 ENCOUNTER — Ambulatory Visit: Payer: Medicare HMO | Admitting: Gastroenterology

## 2019-06-14 DIAGNOSIS — R195 Other fecal abnormalities: Secondary | ICD-10-CM | POA: Diagnosis not present

## 2019-06-14 MED ORDER — PEG 3350-KCL-NA BICARB-NACL 420 G PO SOLR: 4000.0000 mL | ORAL | 0 refills | Status: DC

## 2019-06-14 NOTE — Progress Notes (Signed)
Primary Care Physician:  Maryruth Hancock, MD Primary Gastroenterologist:  Dr. Gala Romney   Chief Complaint  Patient presents with  . + cologuard    HPI:   Kevin Frank is a 78 y.o. male presenting today at the request of Dr. Benny Lennert due to positive cologaurd. Last colonoscopy in 2006 with multiple diminutive polyps in rectum, one pedunculated polyp at 10 cm s/p snare removal. Left-sided diverticulum, pedunculated polyps at 35 cm and splenic flexure s/p snare removal. No path available.   He denies any abdominal pain, changes in bowel habits, rectal bleeding, constipation, diarrhea, dysphagia, GERD. No unexplained weight loss or lack of appetite. 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 Not felt to be a surgical candidate.   Past Medical History:  Diagnosis Date  . AAA (abdominal aortic aneurysm) ([place]) 01/2008   3.2cm  . Allergy   . Arthritis   . Asthma   . BPH (benign prostatic hyperplasia)   . Chronic knee pain    bilateral  . COPD (chronic obstructive pulmonary disease) ([place])   . Diabetes mellitus    type II  . Gout   . History of kidney stones   . Hypercholesterolemia   . Hyperlipidemia   . Hypertension   . 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 Normal nuclear stress test 2011  . Obesity   . Obstructive sleep apnea on CPAP   . Presence of indwelling urinary catheter   . Shortness of breath    due to cigarette abuse  . Tobacco abuse     Past Surgical History:  Procedure Laterality Date  . ABDOMINAL AORTIC ENDOVASCULAR FENESTRATED STENT GRAFT N/A 04/09/2016   Procedure: ABDOMINAL AORTIC ENDOVASCULAR FENESTRATED STENT GRAFT with left brachial artery access using ultrasound guidance;  Surgeon: Serafina Mitchell, MD;  Location: [facility];  Service: Vascular;  Laterality: N/A;  . [place]   [place]  . COLONOSCOPY  2006   2006: multiple diminutive  polyps in rectum, one pedunculated polyp at 10 cm s/p snare removal. Left-sided diverticulum, pedunculated polyps at 35 cm and splenic flexure s/p snare removal. No path available.   [name] PERIPHERAL VASCULAR CATHETERIZATION N/A 04/10/2016   Procedure: Renal Angiography;  Surgeon: Angelia Mould, MD;  Location: [facility];  Service: Cardiovascular;  Laterality: N/A;  . TRANSURETHRAL RESECTION OF PROSTATE  01/07/2011   Procedure: TRANSURETHRAL RESECTION OF THE PROSTATE (TURP);  Surgeon: Marissa Nestle;  Location: [facility];  Service: Urology;  Laterality: N/A;    Current Outpatient Medications  Medication Sig Dispense Refill  . albuterol (VENTOLIN HFA) 108 (90 Base) MCG/ACT inhaler Use 4 puffs every 4-6 hours as needed for cough or wheeze 18 g 2  . allopurinol (ZYLOPRIM) 300 MG tablet Take 300 mg by mouth daily.      [name] amLODipine (NORVASC) 10 MG tablet TAKE 1 TABLET EVERY DAY 90 tablet 3  . atorvastatin (LIPITOR) 20 MG tablet Take 20 mg by mouth daily.     [name] azelastine (ASTELIN) 0.1 % nasal spray Place 1 spray into both nostrils 2 (two) times daily. Use in each nostril as directed    . clopidogrel (PLAVIX) 75 MG tablet TAKE (1) TABLET BY MOUTH ONCE DAILY. 30 tablet 0  . Fluticasone-Umeclidin-Vilant (TRELEGY ELLIPTA) 100-62.5-25 MCG/INH AEPB Inhale 1 puff into the lungs daily. [place]  each 1  . hydrALAZINE (APRESOLINE) 50 MG tablet TAKE (1) TABLET TWICE DAILY. 180 tablet 3  . losartan (COZAAR) 100 MG tablet TAKE 1 TABLET (100 MG TOTAL) BY MOUTH DAILY. 90 tablet 2  . metFORMIN (GLUCOPHAGE) 500 MG tablet Take 1,000 mg by mouth 2 (two) times daily with a meal.     . metoprolol succinate (TOPROL-XL) 25 MG 24 hr tablet TAKE 1 TABLET EVERY DAY 90 tablet 3  . omalizumab (XOLAIR) 150 MG injection Inject 375 mg into the skin every 14 (fourteen) days. 6 each 11  . sertraline (ZOLOFT) 50 MG tablet Take 50 mg by mouth daily.      [name] torsemide (DEMADEX) 20 MG tablet Take 40 mg by mouth daily.     .  methocarbamol (ROBAXIN) 500 MG tablet Take 1 tablet (500 mg total) by mouth 3 (three) times daily. 60 tablet 1   Current Facility-Administered Medications  Medication Dose Route Frequency Provider Last Rate Last Admin  . ipratropium (ATROVENT) nebulizer solution 0.5 mg  0.5 mg Nebulization Once Gean Quint, MD      . levalbuterol Penne Lash) nebulizer solution 1.25 mg  1.25 mg Nebulization Once Gean Quint, MD      . omalizumab Arvid Right) injection 375 mg  375 mg Subcutaneous Q14 Days Valentina Shaggy, MD   375 mg at 06/08/19 1037    Allergies as of 06/14/2019 - Review Complete 06/14/2019  Allergen Reaction Noted  . No known allergies  04/08/2016    Family History  Problem Relation Age of Onset  . Liver disease Father   . Lymphoma Mother   . Diabetes Mother   . Hypertension Mother   . Cancer Mother   . Mental retardation Other        sibling -- from birth   . Asthma Daughter   . Allergic rhinitis Neg Hx   . Angioedema Neg Hx   . Atopy Neg Hx   . Eczema Neg Hx   . Immunodeficiency Neg Hx   . Colon cancer Neg Hx     Social History   Socioeconomic History  . Marital status: Married    Spouse name: Not on file  . Number of children: Not on file  . Years of education: Not on file  . Highest education level: Not on file  Occupational History  . Not on file  Tobacco Use  . Smoking status: Current Every Day Smoker    Packs/day: 3.00    Years: 50.00    Pack years: 150.00    Types: Cigarettes  . Smokeless tobacco: Never Used  . Tobacco comment: 2ppd 10/30/2016 ee  Substance and Sexual Activity  . Alcohol use: Yes    Alcohol/week: 0.0 standard drinks    Comment: very little  . Drug use: No  . Sexual activity: Not on file  Other Topics Concern  . Not on file  Social History Narrative  . Not on file   Social Determinants of Health   Financial Resource Strain:   . Difficulty of Paying Living Expenses: Not on file  Food Insecurity:   . Worried About  Charity fundraiser in the Last Year: Not on file  . Ran Out of Food in the Last Year: Not on file  Transportation Needs:   . Lack of Transportation (Medical): Not on file  . Lack of Transportation (Non-Medical): Not on file  Physical Activity:   . Days of Exercise per Week: Not on file  . Minutes of Exercise  per Session: Not on file  Stress:   . Feeling of Stress : Not on file  Social Connections:   . Frequency of Communication with Friends and Family: Not on file  . Frequency of Social Gatherings with Friends and Family: Not on file  . Attends Religious Services: Not on file  . Active Member of Clubs or Organizations: Not on file  . Attends Archivist Meetings: Not on file  . Marital Status: Not on file  Intimate Partner Violence:   . Fear of Current or Ex-Partner: Not on file  . Emotionally Abused: Not on file  . Physically Abused: Not on file  . Sexually Abused: Not on file    Review of Systems: Gen: Denies any fever, chills, fatigue, weight loss, lack of appetite.  CV: Denies chest pain, heart palpitations, peripheral edema, syncope.  Resp: +DOE GI: Denies dysphagia or odynophagia. Denies jaundice, hematemesis, fecal incontinence. GU : Denies urinary burning, urinary frequency, urinary hesitancy MS: +joint pain  Derm: Denies rash, itching, dry skin Psych: Denies depression, anxiety, memory loss, and confusion Heme: Denies bruising, bleeding, and enlarged lymph nodes.  Physical Exam: BP 126/78   Pulse 77   Temp (!) 97.2 F (36.2 C) (Oral)   Ht 5\' 10"  (1.778 m)   Wt 225 lb 6.4 oz (102.2 kg)   BMI 32.34 kg/m  General:   Alert and oriented. Pleasant and cooperative. Well-nourished and well-developed.  Head:  Normocephalic and atraumatic. Eyes:  Without icterus, sclera clear and conjunctiva pink.  Lungs:  Clear to auscultation bilaterally. No wheezes, rales, or rhonchi. No distress.  Heart:  S1, S2 present without obvious murmur Abdomen:  +BS, soft, obese,  non-tender and non-distended. No HSM noted. No guarding or rebound. No masses appreciated.  Rectal:  Deferred  Msk:  Symmetrical without gross deformities. Normal posture. Extremities:  Without edema. Neurologic:  Alert and  oriented x4;  grossly normal neurologically. Skin:  Intact without significant lesions or rashes. Psych:  Alert and cooperative. Normal mood and affect.  ASSESSMENT: Kevin Frank is a 78 y.o. male presenting today with positive cologuard test but without overt GI bleeding; he has no concerning upper or lower GI signs/symptoms. Last colonoscopy in remote past (2006), but path is not available.   Pursuing diagnostic colonoscopy in near future.    PLAN:  Proceed with TCS with Dr. Gala Romney in near future: the risks, benefits, and alternatives have been discussed with the patient in detail. The patient states understanding and desires to proceed. Propofol due to polypharmacy  No metformin day of procedure   Annitta Needs, PhD, ANP-BC [facility]

## 2019-06-14 NOTE — Patient Instructions (Signed)
We have arranged a colonoscopy with Dr. Gala Romney in the near future.  Please don't take metformin the day of the procedure.  Further recommendations to follow.  Merry Christmas!  It was a pleasure to see you today. I want to create trusting relationships with patients to provide genuine, compassionate, and quality care. I value your feedback. If you receive a survey regarding your visit,  I greatly appreciate you taking time to fill this out.   Annitta Needs, PhD, ANP-BC [facility]

## 2019-06-21 ENCOUNTER — Telehealth: Payer: Self-pay | Admitting: Family Medicine

## 2019-06-21 NOTE — Telephone Encounter (Signed)
Patient is aware and is scheduled for a virtual phone visit tomorrow at 11:40 am

## 2019-06-21 NOTE — Telephone Encounter (Signed)
Patient came in the office today and states he has concerns with his sugar. Yesterday 292 and this morning it was 157 before he ate anything. He states he is taking 2 metformin.

## 2019-06-21 NOTE — Telephone Encounter (Signed)
Schedule a virtual visit with glucose readings, blood pressure readings to adjust medication

## 2019-06-21 NOTE — Telephone Encounter (Signed)
Routing to Dr. Corum for advice ? 

## 2019-06-22 ENCOUNTER — Encounter: Payer: Self-pay | Admitting: Family Medicine

## 2019-06-22 ENCOUNTER — Other Ambulatory Visit: Payer: Self-pay

## 2019-06-22 ENCOUNTER — Telehealth (INDEPENDENT_AMBULATORY_CARE_PROVIDER_SITE_OTHER): Payer: Medicare HMO | Admitting: Family Medicine

## 2019-06-22 ENCOUNTER — Ambulatory Visit (INDEPENDENT_AMBULATORY_CARE_PROVIDER_SITE_OTHER): Payer: Medicare HMO

## 2019-06-22 VITALS — BP 126/78 | Temp 97.2°F | Ht 70.0 in | Wt 225.0 lb

## 2019-06-22 DIAGNOSIS — E119 Type 2 diabetes mellitus without complications: Secondary | ICD-10-CM | POA: Diagnosis not present

## 2019-06-22 DIAGNOSIS — J4540 Moderate persistent asthma, uncomplicated: Secondary | ICD-10-CM | POA: Diagnosis not present

## 2019-06-22 NOTE — Patient Instructions (Signed)
Take metformin twice a day with food  Check glucose readings fasting in the morning and in the evening after dinner-write down the readings and bring to your appointment with Dr. Dorris Fetch  Take blood pressure readings.

## 2019-06-22 NOTE — Progress Notes (Signed)
Virtual Visit via Telephone Note  I connected with Kevin Frank on 06/22/19 at 11:40 AM EST by telephone and verified that I am speaking with the correct person using two identifiers.DOB/Address  Location: Patient: home Provider: office   I discussed the limitations, risks, security and privacy concerns of performing an evaluation and management service by telephone and the availability of in person appointments. I also discussed with the patient that there may be a patient responsible charge related to this service. The patient expressed understanding and agreed to proceed. History of Present Illness: Pt with elevated glucose readings  on home monitor-no dizziness, fatigue A1c 11.8 -12/19-reviewed Dr. Luan Pulling note -insulin suggested-pt declined  Observations/Objective: 292 151 fasting, after breakfast 230-ate omelet BP 142/ BP 136  Assessment and Plan: 1. Diabetes mellitus without complication ([place]) - Ambulatory referral to Endocrinology - COMPLETE METABOLIC PANEL WITH GFR - CBC - Hemoglobin A1c - Urine Microscopic Recommend labwork prior to endo appt-concern for elevated glucose with tob use-pt understands risk but is not interested in quitting.  Pt is currently taking metformin 1000mg  BID-on review of Dr. Luan Pulling notes insulin was discussed previously-pt was not interested in "the shot".  Follow Up Instructions:    I discussed the assessment and treatment plan with the patient. The patient was provided an opportunity to ask questions and all were answered. The patient agreed with the plan and demonstrated an understanding of the instructions.   The patient was advised to call back or seek an in-person evaluation if the symptoms worsen or if the condition fails to improve as anticipated.  I provided  12 minutes of non-face-to-face time during this encounter.   Keara Pagliarulo Hannah Beat, MD

## 2019-06-27 ENCOUNTER — Encounter: Payer: Self-pay | Admitting: Nutrition

## 2019-06-27 ENCOUNTER — Other Ambulatory Visit: Payer: Self-pay

## 2019-06-27 ENCOUNTER — Encounter: Payer: Self-pay | Admitting: "Endocrinology

## 2019-06-27 ENCOUNTER — Ambulatory Visit: Payer: Medicare HMO | Admitting: "Endocrinology

## 2019-06-27 ENCOUNTER — Encounter: Payer: Medicare HMO | Attending: Family Medicine | Admitting: Nutrition

## 2019-06-27 VITALS — BP 124/68 | HR 100 | Ht 70.0 in | Wt 227.0 lb

## 2019-06-27 VITALS — Ht 70.0 in | Wt 227.0 lb

## 2019-06-27 DIAGNOSIS — E1165 Type 2 diabetes mellitus with hyperglycemia: Secondary | ICD-10-CM | POA: Diagnosis not present

## 2019-06-27 DIAGNOSIS — E782 Mixed hyperlipidemia: Secondary | ICD-10-CM | POA: Insufficient documentation

## 2019-06-27 DIAGNOSIS — I10 Essential (primary) hypertension: Secondary | ICD-10-CM | POA: Insufficient documentation

## 2019-06-27 DIAGNOSIS — E6609 Other obesity due to excess calories: Secondary | ICD-10-CM

## 2019-06-27 DIAGNOSIS — E119 Type 2 diabetes mellitus without complications: Secondary | ICD-10-CM

## 2019-06-27 DIAGNOSIS — Z6832 Body mass index (BMI) 32.0-32.9, adult: Secondary | ICD-10-CM

## 2019-06-27 DIAGNOSIS — E1159 Type 2 diabetes mellitus with other circulatory complications: Secondary | ICD-10-CM | POA: Insufficient documentation

## 2019-06-27 LAB — POCT GLYCOSYLATED HEMOGLOBIN (HGB A1C): Hemoglobin A1C: 8.1 % — AB (ref 4.0–5.6)

## 2019-06-27 MED ORDER — GLIPIZIDE ER 5 MG PO TB24: 5.0000 mg | ORAL_TABLET | Freq: Every day | ORAL | 3 refills | Status: DC

## 2019-06-27 NOTE — Patient Instructions (Signed)

## 2019-06-27 NOTE — Progress Notes (Signed)
Endocrinology Consult Note       06/27/2019, 1:31 PM   Subjective:    Patient ID: Kevin Frank, male    DOB: Mar 14, 1941.  Kevin Frank is being seen in consultation for management of currently uncontrolled symptomatic diabetes requested by  Maryruth Hancock, MD.   Past Medical History:  Diagnosis Date  . AAA (abdominal aortic aneurysm) ([place]) 01/2008   3.2cm  . Allergy   . Arthritis   . Asthma   . BPH (benign prostatic hyperplasia)   . Chronic knee pain    bilateral  . COPD (chronic obstructive pulmonary disease) ([place])   . Diabetes mellitus    type II  . Gout   . History of kidney stones   . Hypercholesterolemia   . Hyperlipidemia   . Hypertension   . 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 Normal nuclear stress test 2011  . Obesity   . Obstructive sleep apnea on CPAP   . Presence of indwelling urinary catheter   . Shortness of breath    due to cigarette abuse  . Tobacco abuse     Past Surgical History:  Procedure Laterality Date  . ABDOMINAL AORTIC ENDOVASCULAR FENESTRATED STENT GRAFT N/A 04/09/2016   Procedure: ABDOMINAL AORTIC ENDOVASCULAR FENESTRATED STENT GRAFT with left brachial artery access using ultrasound guidance;  Surgeon: Serafina Mitchell, MD;  Location: [place];  Service: Vascular;  Laterality: N/A;  . [place]   [place]  . COLONOSCOPY  2006   2006: multiple diminutive polyps in rectum, one pedunculated polyp at 10 cm s/p snare removal. Left-sided diverticulum, pedunculated polyps at 35 cm and splenic flexure s/p snare removal. No path available.   [name] PERIPHERAL VASCULAR CATHETERIZATION N/A 04/10/2016   Procedure: Renal Angiography;  Surgeon: Angelia Mould, MD;  Location: [facility];  Service: Cardiovascular;  Laterality: N/A;  . TRANSURETHRAL RESECTION OF PROSTATE  01/07/2011   Procedure: TRANSURETHRAL  RESECTION OF THE PROSTATE (TURP);  Surgeon: Marissa Nestle;  Location: [facility];  Service: Urology;  Laterality: N/A;    Social History   Socioeconomic History  . Marital status: Married    Spouse name: Not on file  . Number of children: Not on file  . Years of education: Not on file  . Highest education level: Not on file  Occupational History  . Not on file  Tobacco Use  . Smoking status: Current Every Day Smoker    Packs/day: 3.00    Years: 50.00    Pack years: 150.00    Types: Cigarettes  . Smokeless tobacco: Never Used  . Tobacco comment: 2ppd 10/30/2016 ee  Substance and Sexual Activity  . Alcohol use: Yes    Alcohol/week: 0.0 standard drinks    Comment: very little  . Drug use: No  . Sexual activity: Not on file  Other Topics Concern  . Not on file  Social History Narrative  . Not on file   Social Determinants of Health   Financial Resource Strain:   . Difficulty of Paying Living Expenses: Not  on file  Food Insecurity:   . Worried About Charity fundraiser in the Last Year: Not on file  . Ran Out of Food in the Last Year: Not on file  Transportation Needs:   . Lack of Transportation (Medical): Not on file  . Lack of Transportation (Non-Medical): Not on file  Physical Activity:   . Days of Exercise per Week: Not on file  . Minutes of Exercise per Session: Not on file  Stress:   . Feeling of Stress : Not on file  Social Connections:   . Frequency of Communication with Friends and Family: Not on file  . Frequency of Social Gatherings with Friends and Family: Not on file  . Attends Religious Services: Not on file  . Active Member of Clubs or Organizations: Not on file  . Attends Archivist Meetings: Not on file  . Marital Status: Not on file    Family History  Problem Relation Age of Onset  . Liver disease Father   . Lymphoma Mother   . Diabetes Mother   . Hypertension Mother   . Cancer Mother   . Mental retardation Other        sibling --  from birth   . Asthma Daughter   . Allergic rhinitis Neg Hx   . Angioedema Neg Hx   . Atopy Neg Hx   . Eczema Neg Hx   . Immunodeficiency Neg Hx   . Colon cancer Neg Hx     Outpatient Encounter Medications as of 06/27/2019  Medication Sig  . albuterol (VENTOLIN HFA) 108 (90 Base) MCG/ACT inhaler Use 4 puffs every 4-6 hours as needed for cough or wheeze  . allopurinol (ZYLOPRIM) 300 MG tablet Take 300 mg by mouth daily.    [name] amLODipine (NORVASC) 10 MG tablet TAKE 1 TABLET EVERY DAY  . atorvastatin (LIPITOR) 20 MG tablet Take 20 mg by mouth daily.   [name] azelastine (ASTELIN) 0.1 % nasal spray Place 1 spray into both nostrils 2 (two) times daily. Use in each nostril as directed  . clopidogrel (PLAVIX) 75 MG tablet TAKE (1) TABLET BY MOUTH ONCE DAILY.  [name] Fluticasone-Umeclidin-Vilant (TRELEGY ELLIPTA) 100-62.5-25 MCG/INH AEPB Inhale 1 puff into the lungs daily.  [name] glipiZIDE (GLUCOTROL XL) 5 MG 24 hr tablet Take 1 tablet (5 mg total) by mouth daily with breakfast.  . hydrALAZINE (APRESOLINE) 50 MG tablet TAKE (1) TABLET TWICE DAILY.  [name] losartan (COZAAR) 100 MG tablet TAKE 1 TABLET (100 MG TOTAL) BY MOUTH DAILY.  . metFORMIN (GLUCOPHAGE) 500 MG tablet Take 1,000 mg by mouth 2 (two) times daily with a meal.   . methocarbamol (ROBAXIN) 500 MG tablet Take 1 tablet (500 mg total) by mouth 3 (three) times daily.  . metoprolol succinate (TOPROL-XL) 25 MG 24 hr tablet TAKE 1 TABLET EVERY DAY  . omalizumab (XOLAIR) 150 MG injection Inject 375 mg into the skin every 14 (fourteen) days.  [name] sertraline (ZOLOFT) 50 MG tablet Take 50 mg by mouth daily.    [name] torsemide (DEMADEX) 20 MG tablet Take 40 mg by mouth daily.    Facility-Administered Encounter Medications as of 06/27/2019  Medication  . ipratropium (ATROVENT) nebulizer solution 0.5 mg  . levalbuterol (XOPENEX) nebulizer solution 1.25 mg  . omalizumab Arvid Right) injection 375 mg    ALLERGIES: Allergies  Allergen Reactions  . No Known Allergies      VACCINATION STATUS: Immunization History  Administered Date(s) Administered  . Influenza, High Dose Seasonal PF 03/05/2016  .  Influenza-Unspecified 02/22/2016, 04/13/2017, 03/09/2018, 03/21/2019  . Pneumococcal Conjugate-13 12/07/2017  . Pneumococcal Polysaccharide-23 01/08/2011, 10/09/2016  . Tdap 04/12/2017    Diabetes He presents for his initial diabetic visit. He has type 2 diabetes mellitus. Onset time: He was diagnosed at approximate age of 38 years. His disease course has been worsening. There are no hypoglycemic associated symptoms. Pertinent negatives for hypoglycemia include no confusion, headaches, pallor or seizures. Associated symptoms include blurred vision, polydipsia and polyuria. Pertinent negatives for diabetes include no chest pain, no fatigue, no polyphagia and no weakness. There are no hypoglycemic complications. Symptoms are worsening. Diabetic complications include PVD and retinopathy. Risk factors for coronary artery disease include diabetes mellitus, male sex, obesity, tobacco exposure, sedentary lifestyle and dyslipidemia. Current diabetic treatments: He is currently on Metformin 1000 mg twice a day. His weight is fluctuating minimally. He is following a generally unhealthy diet. When asked about meal planning, he reported none. He has not had a previous visit with a dietitian. He never participates in exercise. (He did not bring any logs to review today.  His point-of-care A1c was 8.1%. ) An ACE inhibitor/angiotensin II receptor blocker is being taken. Eye exam is current.  Hyperlipidemia This is a chronic problem. The current episode started more than 1 year ago. The problem is controlled. Exacerbating diseases include diabetes and obesity. Pertinent negatives include no chest pain, myalgias or shortness of breath. Risk factors for coronary artery disease include dyslipidemia, diabetes mellitus, obesity, male sex, hypertension and a sedentary lifestyle.   Hypertension This is a chronic problem. The current episode started more than 1 year ago. The problem is controlled. Associated symptoms include blurred vision. Pertinent negatives include no chest pain, headaches, neck pain, palpitations or shortness of breath. Risk factors for coronary artery disease include dyslipidemia, diabetes mellitus, male gender, obesity, sedentary lifestyle and smoking/tobacco exposure. Past treatments include angiotensin blockers. Hypertensive end-organ damage includes PVD and retinopathy.     Review of Systems  Constitutional: Negative for chills, fatigue, fever and unexpected weight change.  HENT: Negative for dental problem, mouth sores and trouble swallowing.   Eyes: Positive for blurred vision. Negative for visual disturbance.  Respiratory: Negative for cough, choking, chest tightness, shortness of breath and wheezing.   Cardiovascular: Negative for chest pain, palpitations and leg swelling.  Gastrointestinal: Negative for abdominal distention, abdominal pain, constipation, diarrhea, nausea and vomiting.  Endocrine: Positive for polydipsia and polyuria. Negative for polyphagia.  Genitourinary: Negative for dysuria, flank pain, hematuria and urgency.  Musculoskeletal: Negative for back pain, gait problem, myalgias and neck pain.  Skin: Negative for pallor, rash and wound.  Neurological: Negative for seizures, syncope, weakness, numbness and headaches.  Psychiatric/Behavioral: Negative for confusion and dysphoric mood.    Objective:    BP 124/68   Pulse 100   Ht 5\' 10"  (1.778 m)   Wt 227 lb (103 kg)   BMI 32.57 kg/m   Wt Readings from Last 3 Encounters:  06/27/19 227 lb (103 kg)  06/27/19 227 lb (103 kg)  06/22/19 225 lb (102.1 kg)     Physical Exam Constitutional:      General: He is not in acute distress.    Appearance: He is well-developed.  HENT:     Head: Normocephalic and atraumatic.  Neck:     Thyroid: No thyromegaly.     Trachea: No  tracheal deviation.  Cardiovascular:     Rate and Rhythm: Normal rate.     Pulses:  Dorsalis pedis pulses are 1+ on the right side and 1+ on the left side.       Posterior tibial pulses are 1+ on the right side and 1+ on the left side.     Heart sounds: S1 normal and S2 normal. No murmur. No gallop.   Pulmonary:     Effort: Pulmonary effort is normal. No respiratory distress.     Breath sounds: No wheezing.  Abdominal:     General: There is no distension.     Tenderness: There is no abdominal tenderness. There is no guarding.  Musculoskeletal:        General: Swelling present.     Right shoulder: No swelling or deformity.     Cervical back: Normal range of motion and neck supple.     Right lower leg: Edema present.     Left lower leg: Edema present.     Comments: He has bilateral knee joint effusions.  Skin:    General: Skin is warm and dry.     Findings: No rash.     Nails: There is no clubbing.  Neurological:     Mental Status: He is alert and oriented to person, place, and time.     Cranial Nerves: No cranial nerve deficit.     Sensory: No sensory deficit.     Gait: Gait normal.     Deep Tendon Reflexes: Reflexes are normal and symmetric.  Psychiatric:        Speech: Speech normal.        Behavior: Behavior normal. Behavior is cooperative.        Thought Content: Thought content normal.        Judgment: Judgment normal.       CMP ( most recent) CMP     Component Value Date/Time   NA 137 04/26/2019 0954   NA 141 03/18/2017 1042   K 3.9 04/26/2019 0954   CL 101 04/26/2019 0954   CO2 25 04/26/2019 0954   GLUCOSE 124 (H) 04/26/2019 0954   BUN 19 04/26/2019 0954   BUN 21 03/23/2019 0000   CREATININE 0.98 04/26/2019 0954   CREATININE 0.98 02/08/2016 1300   CALCIUM 9.2 04/26/2019 0954   PROT 7.3 04/26/2019 0954   ALBUMIN 3.7 04/26/2019 0954   AST 15 04/26/2019 0954   ALT 19 04/26/2019 0954   ALKPHOS 91 04/26/2019 0954   BILITOT 0.6 04/26/2019 0954    GFRNONAA >60 04/26/2019 0954   GFRAA >60 04/26/2019 0954     Diabetic Labs (most recent): Lab Results  Component Value Date   HGBA1C 8.1 (A) 06/27/2019   HGBA1C 6.0 (H) 04/02/2016     Lipid Panel ( most recent) Lipid Panel     Component Value Date/Time   CHOL 117 03/23/2019 0000   TRIG 89 03/23/2019 0000   HDL 39 03/23/2019 0000   CHOLHDL 3 12/16/2010 0854   VLDL 10.6 12/16/2010 0854   LDLCALC 61 03/23/2019 0000      Lab Results  Component Value Date   TSH 1.15 03/23/2019       Assessment & Plan:   1. Uncontrolled type 2 diabetes mellitus with hyperglycemia (HCC)  - Kevin Frank has currently uncontrolled symptomatic type 2 DM since  79 years of age,  with most recent A1c of 8.1 %. Recent labs reviewed. - I had a long discussion with him about the progressive nature of diabetes and the pathology behind its complications. -his diabetes is complicated by obesity, sedentary life, chronic  heavy smoking , peripheral arterial disease and he remains at a high risk for more acute and chronic complications which include CAD, CVA, CKD, retinopathy, and neuropathy. These are all discussed in detail with him.  - I have counseled him on diet  and weight management  by adopting a carbohydrate restricted/protein rich diet. Patient is encouraged to switch to  unprocessed or minimally processed     complex starch and increased protein intake (animal or plant source), fruits, and vegetables. -  he is advised to stick to a routine mealtimes to eat 3 meals  a day and avoid unnecessary snacks ( to snack only to correct hypoglycemia).   - he admits that there is a room for improvement in his food and drink choices. - Suggestion is made for him to avoid simple carbohydrates  from his diet including Cakes, Sweet Desserts, Ice Cream, Soda (diet and regular), Sweet Tea, Candies, Chips, Cookies, Store Bought Juices, Alcohol in Excess of  1-2 drinks a day, Artificial Sweeteners,  Coffee Frank,  and "Sugar-free" Products. This will help patient to have more stable blood glucose profile and potentially avoid unintended weight gain.  - he will be scheduled with Jearld Fenton, RDN, CDE for diabetes education.  - I have approached him with the following individualized plan to manage  his diabetes and patient agrees:   - he wishes to avoid injectable therapy at this time.  He is approached to start monitoring blood glucose 4 times a day-daily before meals and at bedtime and return in 10 days for reevaluation.   -Based on his point-of-care A1c of 8.1% today, he may not need insulin treatment immediately.   - he is encouraged to call clinic for blood glucose levels less than 70 or above 300 mg /dl. - he is advised to continue Metformin 1000 mg p.o. twice daily, therapeutically suitable for patient . -I discussed and added glipizide 5 mg p.o. daily at breakfast. -He is not a suitable candidate for incretin therapy, given his 100-pack-year history of smoking, still currently active smoker.  - Specific targets for  A1c;  LDL, HDL,  and Triglycerides were discussed with the patient.  2) Blood Pressure /Hypertension:  his blood pressure is  controlled to target.   he is advised to continue his current medications including losartan 100 mg p.o. daily with breakfast . 3) Lipids/Hyperlipidemia:   Review of his recent lipid panel showed  controlled  LDL at 61 .  he  is advised to continue    atorvastatin 20 mg daily at bedtime.  Side effects and precautions discussed with him.  4)  Weight/Diet:  Body mass index is 32.57 kg/m.  -   clearly complicating his diabetes care.   he is  a candidate for weight loss. I discussed with him the fact that loss of 5 - 10% of his  current body weight will have the most impact on his diabetes management.  Exercise, and detailed carbohydrates information provided  -  detailed on discharge instructions.  5) Chronic Care/Health Maintenance:  -he  is on ACEI/ARB and  Statin medications and  is encouraged to initiate and continue to follow up with Ophthalmology, Dentist,  Podiatrist at least yearly or according to recommendations, and advised to  quit smoking. I have recommended yearly flu vaccine and pneumonia vaccine at least every 5 years; moderate intensity exercise for up to 150 minutes weekly; and  sleep for at least 7 hours a day.  The patient was counseled on the  dangers of tobacco use, and was advised to quit.  Reviewed strategies to maximize success, including removing cigarettes and smoking materials from environment.   - he is  advised to maintain close follow up with [place], Rex Kras, MD for primary care needs, as well as his other providers for optimal and coordinated care.   - Time spent in this patient care: 50 min, of which > 50% was spent in  counseling  him about his currently uncontrolled, complicated type 2 diabetes; hyperlipidemia; hypertension, obesity, chronic heavy smoking and the rest reviewing his blood glucose logs , discussing his hypoglycemia and hyperglycemia episodes, reviewing his current and  previous labs / studies  ( including abstraction from other facilities) and medications  doses and developing a  long term treatment plan based on the latest standards of care/ guidelines; and documenting his care.    Please refer to Patient Instructions for Blood Glucose Monitoring and Insulin/Medications Dosing Guide"  in media tab for additional information. Please  also refer to " Patient Self Inventory" in the Media  tab for reviewed elements of pertinent patient history.  [name] participated in the discussions, expressed understanding, and voiced agreement with the above plans.  All questions were answered to his satisfaction. he is encouraged to contact clinic should he have any questions or concerns prior to his return visit.   Follow up plan: - Return in about 10 days (around 07/07/2019), or office, for Follow up with Meter and  Logs Only - no Labs.  Glade Lloyd, MD [facility] [address] [place], [place] [address] Phone: [phone]  Fax: [phone]    06/27/2019, 1:31 PM  This note was partially dictated with voice recognition software. Similar sounding words can be transcribed inadequately or may not  be corrected upon review.

## 2019-06-27 NOTE — Progress Notes (Signed)
  Medical Nutrition Therapy:  Appt start time: 1030 end time:  1100.   Assessment:  Primary concerns today: Diabetes Type 2, Obesity,Hyperlipidemia. Lives with his wife. Here to see Dr. Dorris Fetch today. He is HOH. Will address safety questions next visit.  Eats 3 meals and snacks often.  Not testing blood sugar on regular schedule.  Motivated to make changes to improve his DM.  Lab Results  Component Value Date   HGBA1C 8.1 (A) 06/27/2019   CMP Latest Ref Rng & Units 04/26/2019 03/23/2019 11/17/2018  Glucose 70 - 99 mg/dL 124(H) - 106(H)  BUN 8 - 23 mg/dL 19 21 18   Creatinine 0.61 - 1.24 mg/dL 0.98 1.1 1.12  Sodium 135 - 145 mmol/L 137 - 140  Potassium 3.5 - 5.1 mmol/L 3.9 - 4.1  Chloride 98 - 111 mmol/L 101 - 101  CO2 22 - 32 mmol/L 25 - 25  Calcium 8.9 - 10.3 mg/dL 9.2 - 9.2  Total Protein 6.5 - 8.1 g/dL 7.3 - 7.4  Total Bilirubin 0.3 - 1.2 mg/dL 0.6 - 0.7  Alkaline Phos 38 - 126 U/L 91 - 92  AST 15 - 41 U/L 15 - 20  ALT 0 - 44 U/L 19 - 23   Lipid Panel     Component Value Date/Time   CHOL 117 03/23/2019 0000   TRIG 89 03/23/2019 0000   HDL 39 03/23/2019 0000   CHOLHDL 3 12/16/2010 0854   VLDL 10.6 12/16/2010 0854   LDLCALC 61 03/23/2019 0000      Preferred Learning Style:   Auditory  Visual  Hands on  No preference indicated   Learning Readiness:   Ready  Change in progress   MEDICATIONS: see list   DIETARY INTAKE:    Usual physical activity: Walks some  Estimated energy needs: 1800  calories 200 g carbohydrates 135 g protein 50 g fat  Progress Towards Goal(s):  In progress.   Nutritional Diagnosis:  NB-1.1 Food and nutrition-related knowledge deficit As related to Dm Type 2.  As evidenced by A1C 8.1%.    Intervention:  Nutrition and Diabetes education provided on My Plate, CHO counting, meal planning, portion sizes, timing of meals, avoiding snacks between meals unless having a low blood sugar, target ranges for A1C and blood sugars,  signs/symptoms and treatment of hyper/hypoglycemia, monitoring blood sugars, taking medications as prescribed, benefits of exercising 30 minutes per day and prevention of complications of DM.  Goals Follow My Plate Eat three meals per day Cut out snacks Cut out processed foods Increase fresh fruits and vegetables Drink only water Test BS 3 times per day Goal is blood sugar less than 150 before meals and bedtime. .  Teaching Method Utilized:  Visual Auditory Hands on  Handouts given during visit include:  The Plate Method  Diabetes Instructions.  Meal Plan   Barriers to learning/adherence to lifestyle change: none  Demonstrated degree of understanding via:  Teach Back   Monitoring/Evaluation:  Dietary intake, exercise, , and body weight in 10 days. Encouraged his wife to come to go over diet information since he is  Hard of hearing.

## 2019-06-29 ENCOUNTER — Telehealth: Payer: Self-pay | Admitting: Orthopedic Surgery

## 2019-06-29 NOTE — Telephone Encounter (Signed)
U asked too late

## 2019-06-29 NOTE — Telephone Encounter (Signed)
Called back to patient to notify.- mail box is full; unable to leave message.

## 2019-06-29 NOTE — Telephone Encounter (Signed)
Can patient come today, 06/29/19 due to knee really hurtiing - Vs appointment 07/01/19?  Relayed Dr will be here only a very little while today.

## 2019-06-30 NOTE — Telephone Encounter (Signed)
Patient came in to office this morning - had seen that we had called him back yesterday. Aware of scheduled appointment tomorrow, and also of potential for adjustment due to weather forecast.

## 2019-07-01 ENCOUNTER — Ambulatory Visit (INDEPENDENT_AMBULATORY_CARE_PROVIDER_SITE_OTHER): Payer: Medicare HMO | Admitting: Orthopedic Surgery

## 2019-07-01 ENCOUNTER — Other Ambulatory Visit: Payer: Self-pay

## 2019-07-01 VITALS — BP 122/72 | HR 88 | Temp 96.4°F | Ht 70.0 in | Wt 225.0 lb

## 2019-07-01 DIAGNOSIS — M25462 Effusion, left knee: Secondary | ICD-10-CM

## 2019-07-01 MED ORDER — DICLOFENAC SODIUM 1 % EX GEL: 4.0000 g | Freq: Four times a day (QID) | CUTANEOUS | 5 refills | Status: DC

## 2019-07-01 NOTE — Progress Notes (Addendum)
[name] comes in for a checkup again he has not been seen since December 21 has pain and swelling left knee recurrent  Usually undergoes aspiration injection  He also wants a prescription for diclofenac topical  Review of systems he has a generalized body aches no shortness of breath except on exertion which is chronic  His other knee is quiesced sent  He agreed to aspiration injection left knee  Procedure note injection and aspiration left knee joint  Verbal consent was obtained to aspirate and inject the left knee joint   Timeout was completed to confirm the site of aspiration and injection  An 18-gauge needle was used to aspirate the left knee joint from a suprapatellar lateral approach.  The medications used were 40 mg of Depo-Medrol and 1% lidocaine 3 cc  Anesthesia was provided by ethyl chloride and the skin was prepped with alcohol.  After cleaning the skin with alcohol an 18-gauge needle was used to aspirate the right knee joint.  We obtained 25 cc of fluid clear  We followed this by injection of 40 mg of Depo-Medrol and 3 cc 1% lidocaine.  There were no complications. A sterile bandage was applied.  Encounter Diagnosis  Name Primary?  . Effusion of knee joint, left Yes   Meds ordered this encounter  Medications  . diclofenac Sodium (VOLTAREN) 1 % GEL    Sig: Apply 4 g topically 4 (four) times daily.    Dispense:  4 g    Refill:  5     Follow-up 1 week

## 2019-07-01 NOTE — Patient Instructions (Signed)
You have received an injection of steroids into the joint. 15% of patients will have increased pain within the 24 hours postinjection.   This is transient and will go away.   We recommend that you use ice packs on the injection site for 20 minutes every 2 hours and extra strength Tylenol 2 tablets every 8 as needed until the pain resolves.  If you continue to have pain after taking the Tylenol and using the ice please call the office for further instructions.  Pick up prescription through delivery from [place]

## 2019-07-01 NOTE — Addendum Note (Signed)
Addended by: Carole Civil on: 07/01/2019 10:08 AM   Modules accepted: Orders

## 2019-07-06 ENCOUNTER — Ambulatory Visit: Payer: Self-pay

## 2019-07-07 ENCOUNTER — Encounter: Payer: Medicare HMO | Attending: "Endocrinology | Admitting: Nutrition

## 2019-07-07 ENCOUNTER — Other Ambulatory Visit: Payer: Self-pay

## 2019-07-07 ENCOUNTER — Ambulatory Visit: Payer: Medicare HMO | Admitting: "Endocrinology

## 2019-07-07 ENCOUNTER — Encounter: Payer: Self-pay | Admitting: "Endocrinology

## 2019-07-07 ENCOUNTER — Encounter: Payer: Self-pay | Admitting: Nutrition

## 2019-07-07 VITALS — BP 150/79 | HR 64 | Ht 70.0 in | Wt 233.0 lb

## 2019-07-07 DIAGNOSIS — E1165 Type 2 diabetes mellitus with hyperglycemia: Secondary | ICD-10-CM

## 2019-07-07 DIAGNOSIS — E669 Obesity, unspecified: Secondary | ICD-10-CM

## 2019-07-07 DIAGNOSIS — I10 Essential (primary) hypertension: Secondary | ICD-10-CM

## 2019-07-07 DIAGNOSIS — E782 Mixed hyperlipidemia: Secondary | ICD-10-CM

## 2019-07-07 DIAGNOSIS — Z6833 Body mass index (BMI) 33.0-33.9, adult: Secondary | ICD-10-CM

## 2019-07-07 DIAGNOSIS — F17200 Nicotine dependence, unspecified, uncomplicated: Secondary | ICD-10-CM

## 2019-07-07 NOTE — Patient Instructions (Signed)
                                     Advice for Weight Management  -For most of us the best way to lose weight is by diet management. Generally speaking, diet management means consuming less calories intentionally which over time brings about progressive weight loss.  This can be achieved more effectively by restricting carbohydrate consumption to the minimum possible.  So, it is critically important to know your numbers: how much calorie you are consuming and how much calorie you need. More importantly, our carbohydrates sources should be unprocessed or minimally processed complex starch food items.   Sometimes, it is important to balance nutrition by increasing protein intake (animal or plant source), fruits, and vegetables.  -Sticking to a routine mealtime to eat 3 meals a day and avoiding unnecessary snacks is shown to have a big role in weight control. Under normal circumstances, the only time we lose real weight is when we are hungry, so allow hunger to take place- hunger means no food between meal times, only water.  It is not advisable to starve.   -It is better to avoid simple carbohydrates including: Cakes, Sweet Desserts, Ice Cream, Soda (diet and regular), Sweet Tea, Candies, Chips, Cookies, Store Bought Juices, Alcohol in Excess of  1-2 drinks a day, Artificial Sweeteners, Doughnuts, Coffee Creamers, "Sugar-free" Products, etc, etc.  This is not a complete list.....    -Consulting with certified diabetes educators is proven to provide you with the most accurate and current information on diet.  Also, you may be  interested in discussing diet options/exchanges , we can schedule a visit with Kevin Frank, RDN, CDE for individualized nutrition education.  -Exercise: If you are able: 30 -60 minutes a day ,4 days a week, or 150 minutes a week.  The longer the better.  Combine stretch, strength, and aerobic activities.  If you were told in the past that you  have high risk for cardiovascular diseases, you may seek evaluation by your heart doctor prior to initiating moderate to intense exercise programs.                                  Additional Care Considerations for Diabetes   -Diabetes  is a chronic disease.  The most important care consideration is regular follow-up with your diabetes care provider with the goal being avoiding or delaying its complications and to take advantage of advances in medications and technology.    -Type 2 diabetes is known to coexist with other important comorbidities such as high blood pressure and high cholesterol.  It is critical to control not only the diabetes but also the high blood pressure and high cholesterol to minimize and delay the risk of complications including coronary artery disease, stroke, amputations, blindness, etc.    - Studies showed that people with diabetes will benefit from a class of medications known as ACE inhibitors and statins.  Unless there are specific reasons not to be on these medications, the standard of care is to consider getting one from these groups of medications at an optimal doses.  These medications are generally considered safe and proven to help protect the heart and the kidneys.    - People with diabetes are encouraged to initiate and maintain regular follow-up with eye doctors, foot doctors, dentists ,   and if necessary heart and kidney doctors.     - It is highly recommended that people with diabetes quit smoking or stay away from smoking, and get yearly  flu vaccine and pneumonia vaccine at least every 5 years.  One other important lifestyle recommendation is to ensure adequate sleep - at least 6-7 hours of uninterrupted sleep at night.  -Exercise: If you are able: 30 -60 minutes a day, 4 days a week, or 150 minutes a week.  The longer the better.  Combine stretch, strength, and aerobic activities.  If you were told in the past that you have high risk for cardiovascular  diseases, you may seek evaluation by your heart doctor prior to initiating moderate to intense exercise programs.     COVID-19 Vaccine Information can be found at: [email or web address] For questions related to vaccine distribution or appointments, please email [email or web address] or call [phone].        

## 2019-07-07 NOTE — Progress Notes (Signed)
07/07/2019, 2:04 PM  Endocrinology follow-up note   Subjective:    Patient ID: Kevin STEPTER, male    DOB: 1941/03/07.  Kevin Frank is being seen in follow-up after he was seen in consultation for management of currently uncontrolled symptomatic diabetes requested by  Maryruth Hancock, MD.   Past Medical History:  Diagnosis Date  . AAA (abdominal aortic aneurysm) ([place]) 01/2008   3.2cm  . Allergy   . Arthritis   . Asthma   . BPH (benign prostatic hyperplasia)   . Chronic knee pain    bilateral  . COPD (chronic obstructive pulmonary disease) ([place])   . Diabetes mellitus    type II  . Gout   . History of kidney stones   . Hypercholesterolemia   . Hyperlipidemia   . Hypertension   . 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 Normal nuclear stress test 2011  . Obesity   . Obstructive sleep apnea on CPAP   . Presence of indwelling urinary catheter   . Shortness of breath    due to cigarette abuse  . Tobacco abuse     Past Surgical History:  Procedure Laterality Date  . ABDOMINAL AORTIC ENDOVASCULAR FENESTRATED STENT GRAFT N/A 04/09/2016   Procedure: ABDOMINAL AORTIC ENDOVASCULAR FENESTRATED STENT GRAFT with left brachial artery access using ultrasound guidance;  Surgeon: Serafina Mitchell, MD;  Location: [facility];  Service: Vascular;  Laterality: N/A;  . [place]   [place]  . COLONOSCOPY  2006   2006: multiple diminutive polyps in rectum, one pedunculated polyp at 10 cm s/p snare removal. Left-sided diverticulum, pedunculated polyps at 35 cm and splenic flexure s/p snare removal. No path available.   [name] PERIPHERAL VASCULAR CATHETERIZATION N/A 04/10/2016   Procedure: Renal Angiography;  Surgeon: Angelia Mould, MD;  Location: [facility];  Service: Cardiovascular;  Laterality: N/A;  . TRANSURETHRAL RESECTION OF PROSTATE  01/07/2011   Procedure: TRANSURETHRAL RESECTION OF THE PROSTATE  (TURP);  Surgeon: Marissa Nestle;  Location: [facility];  Service: Urology;  Laterality: N/A;    Social History   Socioeconomic History  . Marital status: Married    Spouse name: Not on file  . Number of children: Not on file  . Years of education: Not on file  . Highest education level: Not on file  Occupational History  . Not on file  Tobacco Use  . Smoking status: Current Every Day Smoker    Packs/day: 3.00    Years: 50.00    Pack years: 150.00    Types: Cigarettes  . Smokeless tobacco: Never Used  . Tobacco comment: 2ppd 10/30/2016 ee  Substance and Sexual Activity  . Alcohol use: Yes    Alcohol/week: 0.0 standard drinks    Comment: very little  . Drug use: No  . Sexual activity: Not on file  Other Topics Concern  . Not on file  Social History Narrative  . Not on file   Social Determinants of Health   Financial Resource Strain:   . Difficulty of Paying Living Expenses: Not on file  Food Insecurity:   . Worried About Charity fundraiser in the Last Year: Not on file  . Ran Out of Food in the Last Year: Not on file  Transportation Needs:   . Lack of Transportation (Medical): Not on file  . Lack of Transportation (Non-Medical): Not  on file  Physical Activity:   . Days of Exercise per Week: Not on file  . Minutes of Exercise per Session: Not on file  Stress:   . Feeling of Stress : Not on file  Social Connections:   . Frequency of Communication with Friends and Family: Not on file  . Frequency of Social Gatherings with Friends and Family: Not on file  . Attends Religious Services: Not on file  . Active Member of Clubs or Organizations: Not on file  . Attends Archivist Meetings: Not on file  . Marital Status: Not on file    Family History  Problem Relation Age of Onset  . Liver disease Father   . Lymphoma Mother   . Diabetes Mother   . Hypertension Mother   . Cancer Mother   . Mental retardation Other        sibling -- from birth   . Asthma  Daughter   . Allergic rhinitis Neg Hx   . Angioedema Neg Hx   . Atopy Neg Hx   . Eczema Neg Hx   . Immunodeficiency Neg Hx   . Colon cancer Neg Hx     Outpatient Encounter Medications as of 07/07/2019  Medication Sig  . albuterol (VENTOLIN HFA) 108 (90 Base) MCG/ACT inhaler Use 4 puffs every 4-6 hours as needed for cough or wheeze  . allopurinol (ZYLOPRIM) 300 MG tablet Take 300 mg by mouth daily.    [name] amLODipine (NORVASC) 10 MG tablet TAKE 1 TABLET EVERY DAY  . atorvastatin (LIPITOR) 20 MG tablet Take 20 mg by mouth daily.   [name] azelastine (ASTELIN) 0.1 % nasal spray Place 1 spray into both nostrils 2 (two) times daily. Use in each nostril as directed  . clopidogrel (PLAVIX) 75 MG tablet TAKE (1) TABLET BY MOUTH ONCE DAILY.  [name] diclofenac Sodium (VOLTAREN) 1 % GEL Apply 4 g topically 4 (four) times daily.  . Fluticasone-Umeclidin-Vilant (TRELEGY ELLIPTA) 100-62.5-25 MCG/INH AEPB Inhale 1 puff into the lungs daily.  [name] glipiZIDE (GLUCOTROL XL) 5 MG 24 hr tablet Take 1 tablet (5 mg total) by mouth daily with breakfast.  . hydrALAZINE (APRESOLINE) 50 MG tablet TAKE (1) TABLET TWICE DAILY.  [name] losartan (COZAAR) 100 MG tablet TAKE 1 TABLET (100 MG TOTAL) BY MOUTH DAILY.  . metFORMIN (GLUCOPHAGE) 500 MG tablet Take 1,000 mg by mouth 2 (two) times daily with a meal.   . methocarbamol (ROBAXIN) 500 MG tablet Take 1 tablet (500 mg total) by mouth 3 (three) times daily.  . metoprolol succinate (TOPROL-XL) 25 MG 24 hr tablet TAKE 1 TABLET EVERY DAY  . omalizumab (XOLAIR) 150 MG injection Inject 375 mg into the skin every 14 (fourteen) days.  [name] sertraline (ZOLOFT) 50 MG tablet Take 50 mg by mouth daily.    [name] torsemide (DEMADEX) 20 MG tablet Take 40 mg by mouth daily.    Facility-Administered Encounter Medications as of 07/07/2019  Medication  . ipratropium (ATROVENT) nebulizer solution 0.5 mg  . levalbuterol (XOPENEX) nebulizer solution 1.25 mg  . omalizumab Arvid Right) injection 375 mg     ALLERGIES: Allergies  Allergen Reactions  . No Known Allergies     VACCINATION STATUS: Immunization History  Administered Date(s) Administered  . Influenza, High Dose Seasonal PF 03/05/2016  . Influenza-Unspecified 02/22/2016, 04/13/2017, 03/09/2018, 03/21/2019  . Pneumococcal Conjugate-13 12/07/2017  . Pneumococcal Polysaccharide-23 01/08/2011, 10/09/2016  . Tdap 04/12/2017    Diabetes He presents for his follow-up diabetic visit. He has type 2 diabetes mellitus.  Onset time: He was diagnosed at approximate age of 63 years. His disease course has been worsening. There are no hypoglycemic associated symptoms. Pertinent negatives for hypoglycemia include no confusion, headaches, pallor or seizures. Associated symptoms include blurred vision, polydipsia and polyuria. Pertinent negatives for diabetes include no chest pain, no fatigue, no polyphagia and no weakness. There are no hypoglycemic complications. Symptoms are worsening. Diabetic complications include PVD and retinopathy. Risk factors for coronary artery disease include diabetes mellitus, male sex, obesity, tobacco exposure, sedentary lifestyle and dyslipidemia. Current diabetic treatments: He is currently on Metformin 1000 mg twice a day. His weight is increasing steadily. He is following a generally unhealthy diet. When asked about meal planning, he reported none. He has not had a previous visit with a dietitian. He never participates in exercise. His breakfast blood glucose range is generally 180-200 mg/dl. His overall blood glucose range is 180-200 mg/dl. (He brought in a log showing above target fasting blood glucose profile.  Mainly due to the fact that he stopped his Metformin when he was not supposed to.  His recent point-of-care A1c was 8.1%.  ) An ACE inhibitor/angiotensin II receptor blocker is being taken. Eye exam is current.  Hyperlipidemia This is a chronic problem. The current episode started more than 1 year ago. The  problem is controlled. Exacerbating diseases include diabetes and obesity. Pertinent negatives include no chest pain, myalgias or shortness of breath. Risk factors for coronary artery disease include dyslipidemia, diabetes mellitus, obesity, male sex, hypertension and a sedentary lifestyle.  Hypertension This is a chronic problem. The current episode started more than 1 year ago. The problem is controlled. Associated symptoms include blurred vision. Pertinent negatives include no chest pain, headaches, neck pain, palpitations or shortness of breath. Risk factors for coronary artery disease include dyslipidemia, diabetes mellitus, male gender, obesity, sedentary lifestyle and smoking/tobacco exposure. Past treatments include angiotensin blockers. Hypertensive end-organ damage includes PVD and retinopathy.     Review of Systems  Constitutional: Negative for chills, fatigue, fever and unexpected weight change.  HENT: Negative for dental problem, mouth sores and trouble swallowing.   Eyes: Positive for blurred vision. Negative for visual disturbance.  Respiratory: Negative for cough, choking, chest tightness, shortness of breath and wheezing.   Cardiovascular: Negative for chest pain, palpitations and leg swelling.  Gastrointestinal: Negative for abdominal distention, abdominal pain, constipation, diarrhea, nausea and vomiting.  Endocrine: Positive for polydipsia and polyuria. Negative for polyphagia.  Genitourinary: Negative for dysuria, flank pain, hematuria and urgency.  Musculoskeletal: Negative for back pain, gait problem, myalgias and neck pain.  Skin: Negative for pallor, rash and wound.  Neurological: Negative for seizures, syncope, weakness, numbness and headaches.  Psychiatric/Behavioral: Negative for confusion and dysphoric mood.    Objective:    BP (!) 150/79   Pulse 64   Ht 5\' 10"  (1.778 m)   Wt 233 lb (105.7 kg)   BMI 33.43 kg/m   Wt Readings from Last 3 Encounters:  07/07/19  233 lb (105.7 kg)  07/01/19 225 lb (102.1 kg)  06/27/19 227 lb (103 kg)     Physical Exam Constitutional:      General: He is not in acute distress.    Appearance: He is well-developed.  HENT:     Head: Normocephalic and atraumatic.  Neck:     Thyroid: No thyromegaly.     Trachea: No tracheal deviation.  Cardiovascular:     Rate and Rhythm: Normal rate.     Pulses:  Dorsalis pedis pulses are 1+ on the right side and 1+ on the left side.       Posterior tibial pulses are 1+ on the right side and 1+ on the left side.     Heart sounds: S1 normal and S2 normal. No murmur. No gallop.   Pulmonary:     Effort: Pulmonary effort is normal. No respiratory distress.     Breath sounds: No wheezing.  Abdominal:     General: There is no distension.     Tenderness: There is no abdominal tenderness. There is no guarding.  Musculoskeletal:        General: Swelling present.     Right shoulder: No swelling or deformity.     Cervical back: Normal range of motion and neck supple.     Right lower leg: Edema present.     Left lower leg: Edema present.     Comments: He has bilateral knee joint effusions.  Skin:    General: Skin is warm and dry.     Findings: No rash.     Nails: There is no clubbing.  Neurological:     Mental Status: He is alert and oriented to person, place, and time.     Cranial Nerves: No cranial nerve deficit.     Sensory: No sensory deficit.     Gait: Gait normal.     Deep Tendon Reflexes: Reflexes are normal and symmetric.  Psychiatric:        Speech: Speech normal.        Behavior: Behavior normal. Behavior is cooperative.        Thought Content: Thought content normal.        Judgment: Judgment normal.       CMP ( most recent) CMP     Component Value Date/Time   NA 137 04/26/2019 0954   NA 141 03/18/2017 1042   K 3.9 04/26/2019 0954   CL 101 04/26/2019 0954   CO2 25 04/26/2019 0954   GLUCOSE 124 (H) 04/26/2019 0954   BUN 19 04/26/2019 0954   BUN  21 03/23/2019 0000   CREATININE 0.98 04/26/2019 0954   CREATININE 0.98 02/08/2016 1300   CALCIUM 9.2 04/26/2019 0954   PROT 7.3 04/26/2019 0954   ALBUMIN 3.7 04/26/2019 0954   AST 15 04/26/2019 0954   ALT 19 04/26/2019 0954   ALKPHOS 91 04/26/2019 0954   BILITOT 0.6 04/26/2019 0954   GFRNONAA >60 04/26/2019 0954   GFRAA >60 04/26/2019 0954     Diabetic Labs (most recent): Lab Results  Component Value Date   HGBA1C 8.1 (A) 06/27/2019   HGBA1C 6.0 (H) 04/02/2016     Lipid Panel ( most recent) Lipid Panel     Component Value Date/Time   CHOL 117 03/23/2019 0000   TRIG 89 03/23/2019 0000   HDL 39 03/23/2019 0000   CHOLHDL 3 12/16/2010 0854   VLDL 10.6 12/16/2010 0854   LDLCALC 61 03/23/2019 0000      Lab Results  Component Value Date   TSH 1.15 03/23/2019       Assessment & Plan:   1. Uncontrolled type 2 diabetes mellitus with hyperglycemia (HCC)  - HOLDEN MANISCALCO has currently uncontrolled symptomatic type 2 DM since  79 years of age. -He returns with above target fasting glycemic profile mainly due to the fact that he stopped his Metformin when he was not supposed to.  His most recent point-of-care A1c was 8.1%.  - Recent labs reviewed. - I had  a long discussion with him about the progressive nature of diabetes and the pathology behind its complications. -his diabetes is complicated by obesity, sedentary life, chronic heavy smoking , peripheral arterial disease and he remains at a high risk for more acute and chronic complications which include CAD, CVA, CKD, retinopathy, and neuropathy. These are all discussed in detail with him.  - I have counseled him on diet  and weight management  by adopting a carbohydrate restricted/protein rich diet. Patient is encouraged to switch to  unprocessed or minimally processed   complex starch and increased protein intake (animal or plant source), fruits, and vegetables. -  he is advised to stick to a routine mealtimes to eat 3  meals  a day and avoid unnecessary snacks ( to snack only to correct hypoglycemia).   - he  admits there is a room for improvement in his diet and drink choices. -  Suggestion is made for him to avoid simple carbohydrates  from his diet including Cakes, Sweet Desserts / Pastries, Ice Cream, Soda (diet and regular), Sweet Tea, Candies, Chips, Cookies, Sweet Pastries,  Store Bought Juices, Alcohol in Excess of  1-2 drinks a day, Artificial Sweeteners, Coffee Creamer, and "Sugar-free" Products. This will help patient to have stable blood glucose profile and potentially avoid unintended weight gain.   - he will be scheduled with Jearld Fenton, RDN, CDE for diabetes education.  - I have approached him with the following individualized plan to manage  his diabetes and patient agrees:   - he wishes to avoid injectable therapy at this time.  He is reapproached to continue with Metformin 1000 mg p.o. twice daily which is therapeutically suitable for him.  He is advised to continue glipizide 5 mg p.o. daily at breakfast.  He agrees to continue to monitor blood glucose at least once a day, fasting daily.  -He is not a suitable candidate for incretin therapy, given his 100-pack-year history of smoking, still currently active smoker.  - Specific targets for  A1c;  LDL, HDL,  and Triglycerides were discussed with the patient.  2) Blood Pressure /Hypertension: His blood pressure is not controlled to target.  he is advised to continue his current medications including losartan 100 mg p.o. daily with breakfast . 3) Lipids/Hyperlipidemia:   Review of his recent lipid panel showed  controlled  LDL at 61 .  he  is advised to continue atorvastatin 20 mg p.o. daily at bedtime. Side effects and precautions discussed with him.  4)  Weight/Diet:  Body mass index is 33.43 kg/m.  -   clearly complicating his diabetes care.   he is  a candidate for weight loss. I discussed with him the fact that loss of 5 - 10% of his   current body weight will have the most impact on his diabetes management.  Exercise, and detailed carbohydrates information provided  -  detailed on discharge instructions.  5) Chronic Care/Health Maintenance:  -he  is on ACEI/ARB and Statin medications and  is encouraged to initiate and continue to follow up with Ophthalmology, Dentist,  Podiatrist at least yearly or according to recommendations, and advised to  quit smoking. I have recommended yearly flu vaccine and pneumonia vaccine at least every 5 years; moderate intensity exercise for up to 150 minutes weekly; and  sleep for at least 7 hours a day.  Patient is a chronic long-term smoker: The patient was counseled on the dangers of tobacco use, and was advised to quit.  Reviewed strategies  to maximize success, including removing cigarettes and smoking materials from environment.    - he is  advised to maintain close follow up with [place], Rex Kras, MD for primary care needs, as well as his other providers for optimal and coordinated care.   - Time spent on this patient care encounter:  35 min, of which > 50% was spent in  counseling and the rest reviewing his blood glucose logs , discussing his hypoglycemia and hyperglycemia episodes, reviewing his current and  previous labs / studies  ( including abstraction from other facilities) and medications  doses and developing a  long term treatment plan and documenting his care.   Please refer to Patient Instructions for Blood Glucose Monitoring and Insulin/Medications Dosing Guide"  in media tab for additional information. Please  also refer to " Patient Self Inventory" in the Media  tab for reviewed elements of pertinent patient history.  [name] participated in the discussions, expressed understanding, and voiced agreement with the above plans.  All questions were answered to his satisfaction. he is encouraged to contact clinic should he have any questions or concerns prior to his return  visit.    Follow up plan: - Return in about 3 months (around 10/05/2019) for Bring Meter and Logs- A1c in Office.  Glade Lloyd, MD [facility] [address] [place], [place] [address] Phone: [phone]  Fax: [phone]    07/07/2019, 2:04 PM  This note was partially dictated with voice recognition software. Similar sounding words can be transcribed inadequately or may not  be corrected upon review.

## 2019-07-07 NOTE — Patient Instructions (Signed)
Goals Take Metformin 1000 mg twice a day Continue taking Glipizide every day. Follow My Plate Eat three meals per day Cut out snacks Cut out processed foods Increase fresh fruits and vegetables Drink only water Test BS 3 times per day Goal is blood sugar less than 150 before meals and bedtime.

## 2019-07-07 NOTE — Progress Notes (Signed)
  Medical Nutrition Therapy:  Appt start time:  end time:  1100.   Assessment:  Primary concerns today: Diabetes Type 2, Obesity,Hyperlipidemia. FOllow up. He misunderstood and was only taking his Glipizide 5 mg and didn't realize he should be still taking Metfomrin 1000 mg BID.  He notes he is working on eating better balanced meals, more vegetabels and cutting out snacks after supper.  Here to see Dr. Dorris Fetch today. He is HOH.    Motivated to make changes to improve his DM.  Lab Results  Component Value Date   HGBA1C 8.1 (A) 06/27/2019   CMP Latest Ref Rng & Units 04/26/2019 03/23/2019 11/17/2018  Glucose 70 - 99 mg/dL 124(H) - 106(H)  BUN 8 - 23 mg/dL 19 21 18   Creatinine 0.61 - 1.24 mg/dL 0.98 1.1 1.12  Sodium 135 - 145 mmol/L 137 - 140  Potassium 3.5 - 5.1 mmol/L 3.9 - 4.1  Chloride 98 - 111 mmol/L 101 - 101  CO2 22 - 32 mmol/L 25 - 25  Calcium 8.9 - 10.3 mg/dL 9.2 - 9.2  Total Protein 6.5 - 8.1 g/dL 7.3 - 7.4  Total Bilirubin 0.3 - 1.2 mg/dL 0.6 - 0.7  Alkaline Phos 38 - 126 U/L 91 - 92  AST 15 - 41 U/L 15 - 20  ALT 0 - 44 U/L 19 - 23   Lipid Panel     Component Value Date/Time   CHOL 117 03/23/2019 0000   TRIG 89 03/23/2019 0000   HDL 39 03/23/2019 0000   CHOLHDL 3 12/16/2010 0854   VLDL 10.6 12/16/2010 0854   LDLCALC 61 03/23/2019 0000      Preferred Learning Style:   Auditory  Visual  Hands on  No preference indicated   Learning Readiness:   Ready  Change in progress   MEDICATIONS: see list   DIETARY INTAKE:  Eating 3 meals and trying to cut out snacks.  Usual physical activity: Walks some  Estimated energy needs: 1800  calories 200 g carbohydrates 135 g protein 50 g fat  Progress Towards Goal(s):  In progress.   Nutritional Diagnosis:  NB-1.1 Food and nutrition-related knowledge deficit As related to Dm Type 2.  As evidenced by A1C 8.1%.    Intervention:  Nutrition and Diabetes education provided on My Plate, CHO counting, meal  planning, portion sizes, timing of meals, avoiding snacks between meals unless having a low blood sugar, target ranges for A1C and blood sugars, signs/symptoms and treatment of hyper/hypoglycemia, monitoring blood sugars, taking medications as prescribed, benefits of exercising 30 minutes per day and prevention of complications of DM.  Goals Take Metformin 1000 mg twice a day Continue taking Glipizide every day. Follow My Plate Eat three meals per day Cut out snacks Cut out processed foods Increase fresh fruits and vegetables Drink only water Test BS 3 times per day Goal is blood sugar less than 150 before meals and bedtime. .  Teaching Method Utilized:  Visual Auditory Hands on  Handouts given during visit include:  The Plate Method  Diabetes Instructions.  Meal Plan   Barriers to learning/adherence to lifestyle change: none  Demonstrated degree of understanding via:  Teach Back   Monitoring/Evaluation:  Dietary intake, exercise, , and body weight in 10 days. Encouraged his wife to come to go over diet information since he is  Hard of hearing.

## 2019-07-08 ENCOUNTER — Ambulatory Visit (INDEPENDENT_AMBULATORY_CARE_PROVIDER_SITE_OTHER): Payer: Medicare HMO

## 2019-07-08 ENCOUNTER — Ambulatory Visit: Payer: Medicare HMO | Admitting: Orthopedic Surgery

## 2019-07-08 DIAGNOSIS — J4540 Moderate persistent asthma, uncomplicated: Secondary | ICD-10-CM

## 2019-07-11 ENCOUNTER — Ambulatory Visit: Payer: Medicare HMO | Admitting: Orthopedic Surgery

## 2019-07-12 ENCOUNTER — Ambulatory Visit (INDEPENDENT_AMBULATORY_CARE_PROVIDER_SITE_OTHER): Payer: Medicare HMO | Admitting: Orthopedic Surgery

## 2019-07-12 ENCOUNTER — Other Ambulatory Visit: Payer: Self-pay

## 2019-07-12 ENCOUNTER — Encounter: Payer: Self-pay | Admitting: Orthopedic Surgery

## 2019-07-12 VITALS — BP 115/61 | HR 91 | Temp 97.3°F | Ht 70.0 in | Wt 231.0 lb

## 2019-07-12 DIAGNOSIS — M25462 Effusion, left knee: Secondary | ICD-10-CM | POA: Diagnosis not present

## 2019-07-12 NOTE — Patient Instructions (Signed)
Follow up Friday jan 29

## 2019-07-12 NOTE — Progress Notes (Signed)
Chief Complaint  Patient presents with  . Knee Pain    Lt knee, states it's feeling ok today but swelling in ankle.   Encounter Diagnosis  Name Primary?  . Effusion of knee joint, left Yes   Physical Exam Musculoskeletal:     Left knee: Effusion and crepitus present. No swelling, deformity or bony tenderness. No tenderness.    Doing well no treatment needed    Chronic stable no treatment needed   F/u jan 29th

## 2019-07-18 ENCOUNTER — Encounter: Payer: Self-pay | Admitting: Orthopedic Surgery

## 2019-07-18 ENCOUNTER — Ambulatory Visit: Payer: Medicare HMO | Admitting: Orthopedic Surgery

## 2019-07-18 ENCOUNTER — Other Ambulatory Visit: Payer: Self-pay

## 2019-07-18 VITALS — BP 111/63 | HR 85 | Ht 70.0 in | Wt 229.0 lb

## 2019-07-18 DIAGNOSIS — M25462 Effusion, left knee: Secondary | ICD-10-CM

## 2019-07-18 NOTE — Progress Notes (Signed)
Chief Complaint  Patient presents with  . Knee Pain    left/ feels better today     79 year old male followed for left knee effusion has not complaints today says his knee feels fine  His exam shows no swelling good range of motion he is walking without a limp  Follow-up in 2 weeks  Encounter Diagnosis  Name Primary?  . Effusion of knee joint, left Yes

## 2019-07-22 ENCOUNTER — Ambulatory Visit (INDEPENDENT_AMBULATORY_CARE_PROVIDER_SITE_OTHER): Payer: Medicare HMO

## 2019-07-22 DIAGNOSIS — J4540 Moderate persistent asthma, uncomplicated: Secondary | ICD-10-CM | POA: Diagnosis not present

## 2019-07-27 ENCOUNTER — Telehealth: Payer: Self-pay

## 2019-07-27 DIAGNOSIS — I10 Essential (primary) hypertension: Secondary | ICD-10-CM

## 2019-07-27 MED ORDER — TORSEMIDE 20 MG PO TABS: 20.0000 mg | ORAL_TABLET | Freq: Two times a day (BID) | ORAL | 1 refills | Status: DC

## 2019-07-27 NOTE — Telephone Encounter (Signed)
Kevin Frank, CMA  

## 2019-07-28 ENCOUNTER — Other Ambulatory Visit: Payer: Self-pay

## 2019-07-28 NOTE — Telephone Encounter (Signed)
Prednisone 10 mg  Qty 60 Tablets  Take 1 tablet by mouth 2 times a day with meals.  PATIENT USES [facility]

## 2019-07-29 MED ORDER — PREDNISONE 10 MG PO TABS: 10.0000 mg | ORAL_TABLET | Freq: Two times a day (BID) | ORAL | 0 refills | Status: DC

## 2019-08-01 ENCOUNTER — Ambulatory Visit (INDEPENDENT_AMBULATORY_CARE_PROVIDER_SITE_OTHER): Payer: Medicare HMO | Admitting: Orthopedic Surgery

## 2019-08-01 ENCOUNTER — Other Ambulatory Visit: Payer: Self-pay

## 2019-08-01 VITALS — BP 121/64 | HR 57 | Temp 97.2°F | Ht 70.0 in | Wt 229.0 lb

## 2019-08-01 DIAGNOSIS — M25462 Effusion, left knee: Secondary | ICD-10-CM

## 2019-08-01 NOTE — Progress Notes (Signed)
Chief Complaint  Patient presents with  . Follow-up    Recheck on left knee.    78 year old male with chronic left knee pain and swelling with chronic effusions seems to be doing okay today has no real discomfort  He is having some pain in his right shoulder he got an injection there 6 weeks ago says he does not want one today  Recommend routine activity avoid stressful things that aggravate the knee come back in 2 weeks

## 2019-08-05 ENCOUNTER — Other Ambulatory Visit: Payer: Self-pay

## 2019-08-05 ENCOUNTER — Ambulatory Visit (INDEPENDENT_AMBULATORY_CARE_PROVIDER_SITE_OTHER): Payer: Medicare HMO

## 2019-08-05 DIAGNOSIS — J4540 Moderate persistent asthma, uncomplicated: Secondary | ICD-10-CM | POA: Diagnosis not present

## 2019-08-08 ENCOUNTER — Other Ambulatory Visit: Payer: Self-pay | Admitting: Nurse Practitioner

## 2019-08-08 DIAGNOSIS — I10 Essential (primary) hypertension: Secondary | ICD-10-CM

## 2019-08-08 MED ORDER — HYDRALAZINE HCL 50 MG PO TABS: ORAL_TABLET | ORAL | 2 refills | Status: DC

## 2019-08-08 NOTE — Telephone Encounter (Signed)
Pt's pharmacy is requesting a refill on Torsemide. Would Truitt Merle, NP like to refill this medication? please address

## 2019-08-15 ENCOUNTER — Encounter: Payer: Self-pay | Admitting: Orthopedic Surgery

## 2019-08-15 ENCOUNTER — Ambulatory Visit: Payer: Medicare HMO | Admitting: Orthopedic Surgery

## 2019-08-22 ENCOUNTER — Other Ambulatory Visit: Payer: Self-pay

## 2019-08-22 ENCOUNTER — Ambulatory Visit: Payer: Medicare HMO

## 2019-08-22 ENCOUNTER — Ambulatory Visit (INDEPENDENT_AMBULATORY_CARE_PROVIDER_SITE_OTHER): Payer: Medicare HMO | Admitting: Orthopedic Surgery

## 2019-08-22 VITALS — BP 124/70 | HR 89 | Temp 97.3°F | Ht 70.0 in | Wt 228.0 lb

## 2019-08-22 DIAGNOSIS — M25511 Pain in right shoulder: Secondary | ICD-10-CM | POA: Diagnosis not present

## 2019-08-22 DIAGNOSIS — M7551 Bursitis of right shoulder: Secondary | ICD-10-CM

## 2019-08-22 NOTE — Patient Instructions (Addendum)
Bursitis of the shoulder   Start exercises do daily for 6 weeks    You have received an injection of steroids into the joint. 15% of patients will have increased pain within the 24 hours postinjection.   This is transient and will go away.   We recommend that you use ice packs on the injection site for 20 minutes every 2 hours and extra strength Tylenol 2 tablets every 8 as needed until the pain resolves.  If you continue to have pain after taking the Tylenol and using the ice please call the office for further instructions.  Smoking Tobacco Information, Adult Smoking tobacco can be harmful to your health. Tobacco contains a poisonous (toxic), colorless chemical called nicotine. Nicotine is addictive. It changes the brain and can make it hard to stop smoking. Tobacco also has other toxic chemicals that can hurt your body and raise your risk of many cancers. How can smoking tobacco affect me? Smoking tobacco puts you at risk for:  Cancer. Smoking is most commonly associated with lung cancer, but can also lead to cancer in other parts of the body.  Chronic obstructive pulmonary disease (COPD). This is a long-term lung condition that makes it hard to breathe. It also gets worse over time.  High blood pressure (hypertension), heart disease, stroke, or heart attack.  Lung infections, such as pneumonia.  Cataracts. This is when the lenses in the eyes become clouded.  Digestive problems. This may include peptic ulcers, heartburn, and gastroesophageal reflux disease (GERD).  Oral health problems, such as gum disease and tooth loss.  Loss of taste and smell. Smoking can affect your appearance by causing:  Wrinkles.  Yellow or stained teeth, fingers, and fingernails. Smoking tobacco can also affect your social life, because:  It may be challenging to find places to smoke when away from home. Many workplaces, [employer], hotels, and public places are tobacco-free.  Smoking is  expensive. This is due to the cost of tobacco and the long-term costs of treating health problems from smoking.  Secondhand smoke may affect those around you. Secondhand smoke can cause lung cancer, breathing problems, and heart disease. Children of smokers have a higher risk for: ? Sudden infant death syndrome (SIDS). ? Ear infections. ? Lung infections. If you currently smoke tobacco, quitting now can help you:  Lead a longer and healthier life.  Look, smell, breathe, and feel better over time.  Save money.  Protect others from the harms of secondhand smoke. What actions can I take to prevent health problems? Quit smoking   Do not start smoking. Quit if you already do.  Make a plan to quit smoking and commit to it. Look for programs to help you and ask your health care provider for recommendations and ideas.  Set a date and write down all the reasons you want to quit.  Let your friends and family know you are quitting so they can help and support you. Consider finding friends who also want to quit. It can be easier to quit with someone else, so that you can support each other.  Talk with your health care provider about using nicotine replacement medicines to help you quit, such as gum, lozenges, patches, sprays, or pills.  Do not replace cigarette smoking with electronic cigarettes, which are commonly called e-cigarettes. The safety of e-cigarettes is not known, and some may contain harmful chemicals.  If you try to quit but return to smoking, stay positive. It is common to slip up when you  first quit, so take it one day at a time.  Be prepared for cravings. When you feel the urge to smoke, chew gum or suck on hard candy. Lifestyle  Stay busy and take care of your body.  Drink enough fluid to keep your urine pale yellow.  Get plenty of exercise and eat a healthy diet. This can help prevent weight gain after quitting.  Monitor your eating habits. Quitting smoking can cause  you to have a larger appetite than when you smoke.  Find ways to relax. Go out with friends or family to a movie or a restaurant where people do not smoke.  Ask your health care provider about having regular tests (screenings) to check for cancer. This may include blood tests, imaging tests, and other tests.  Find ways to manage your stress, such as meditation, yoga, or exercise. Where to find support To get support to quit smoking, consider:  Asking your health care provider for more information and resources.  Taking classes to learn more about quitting smoking.  Looking for local organizations that offer resources about quitting smoking.  Joining a support group for people who want to quit smoking in your local community.  Calling the smokefree.gov counselor helpline: 1-800-Quit-Now 417-448-0584) Where to find more information You may find more information about quitting smoking from:  HelpGuide.org: www.helpguide.org  https://hall.com/: smokefree.gov  American Lung Association: www.lung.org Contact a health care provider if you:  Have problems breathing.  Notice that your lips, nose, or fingers turn blue.  Have chest pain.  Are coughing up blood.  Feel faint or you pass out.  Have other health changes that cause you to worry. Summary  Smoking tobacco can negatively affect your health, the health of those around you, your finances, and your social life.  Do not start smoking. Quit if you already do. If you need help quitting, ask your health care provider.  Think about joining a support group for people who want to quit smoking in your local community. There are many effective programs that will help you to quit this behavior. This information is not intended to replace advice given to you by your health care provider. Make sure you discuss any questions you have with your health care provider. Document Revised: 03/04/2019 Document Reviewed: 06/24/2016 Elsevier  Patient Education  Kevin Frank is when the fluid-filled sac (bursa) that covers and protects a joint is swollen (inflamed). Bursitis is most common near joints such as the knees, elbows, hips, and shoulders. It can cause pain and stiffness. Follow these instructions at home: Medicines  Take over-the-counter and prescription medicines only as told by your doctor.  If you were prescribed an antibiotic medicine, take it as told by your doctor. Do not stop taking it even if you start to feel better. General instructions   Rest the affected area as told by your doctor. ? If you can, raise (elevate) the affected area above the level of your heart while you are sitting or lying down. ? Avoid doing things that make the pain worse.  Use a splint, brace, pad, or walking aid as told by your doctor.  If directed, put ice on the affected area: ? If you have a removable splint or brace, take it off as told by your doctor. ? Put ice in a plastic bag. ? Place a towel between your skin and the bag, or between the splint or brace and the bag. ? Leave the ice on for  20 minutes, 2-3 times a day.  Keep all follow-up visits as told by your doctor. This is important. Preventing symptoms Do these things to help you not have symptoms again:  Wear knee pads if you kneel often.  Wear sturdy running or walking shoes that fit you well.  Take a lot of breaks during activities that involve doing the same movements again and again.  Before you do any activity that takes a lot of effort, get your body ready by stretching.  Stay at a healthy weight or lose weight if your doctor says you should. If you need help doing this, ask your doctor.  Exercise often. If you start any new physical activity, do it slowly. Contact a doctor if you:  Have a fever.  Have chills.  Have symptoms that do not get better with treatment or home care. Summary  Bursitis is when the fluid-filled  sac (bursa) that covers and protects a joint is swollen.  Rest the affected area as told by your doctor.  Avoid doing things that make the pain worse.  Put ice on the affected area as told by your doctor. This information is not intended to replace advice given to you by your health care provider. Make sure you discuss any questions you have with your health care provider. Document Revised: 05/22/2017 Document Reviewed: 04/24/2017 Elsevier Patient Education  2020 [employer].

## 2019-08-22 NOTE — Progress Notes (Signed)
Kevin Frank  08/22/2019  Body mass index is 32.71 kg/m.   HISTORY SECTION :  Chief Complaint  Patient presents with  . Shoulder Pain    R/hurting/can't raise up or down much for about a week now   HPI The patient presents for evaluation of (mild/moderate/severe/ ) moderate pain, in the (right /left) right shoulder, for 1 week, associated with decreased range of motion.  Prior treatment none   Review of Systems  Musculoskeletal: Positive for neck pain.  Neurological: Negative for tingling.     has a past medical history of AAA (abdominal aortic aneurysm) ([place]) (01/2008), Allergy, Arthritis, Asthma, BPH (benign prostatic hyperplasia), Chronic knee pain, COPD (chronic obstructive pulmonary disease) ([place]), Diabetes mellitus, Gout, History of kidney stones, Hypercholesterolemia, Hyperlipidemia, Hypertension, Lung cancer ([place]) (2019), Normal nuclear stress test (2011), Obesity, Obstructive sleep apnea on CPAP, Presence of indwelling urinary catheter, Shortness of breath, and Tobacco abuse.   Past Surgical History:  Procedure Laterality Date  . ABDOMINAL AORTIC ENDOVASCULAR FENESTRATED STENT GRAFT N/A 04/09/2016   Procedure: ABDOMINAL AORTIC ENDOVASCULAR FENESTRATED STENT GRAFT with left brachial artery access using ultrasound guidance;  Surgeon: Serafina Mitchell, MD;  Location: [facility];  Service: Vascular;  Laterality: N/A;  . [place]   [place]  . COLONOSCOPY  2006   2006: multiple diminutive polyps in rectum, one pedunculated polyp at 10 cm s/p snare removal. Left-sided diverticulum, pedunculated polyps at 35 cm and splenic flexure s/p snare removal. No path available.   [name] PERIPHERAL VASCULAR CATHETERIZATION N/A 04/10/2016   Procedure: Renal Angiography;  Surgeon: Angelia Mould, MD;  Location: [facility];  Service: Cardiovascular;  Laterality: N/A;  . TRANSURETHRAL RESECTION OF PROSTATE  01/07/2011   Procedure: TRANSURETHRAL RESECTION OF THE PROSTATE (TURP);   Surgeon: Marissa Nestle;  Location: [facility];  Service: Urology;  Laterality: N/A;    Body mass index is 32.71 kg/m.   Allergies  Allergen Reactions  . No Known Allergies      Current Outpatient Medications:  .  albuterol (VENTOLIN HFA) 108 (90 Base) MCG/ACT inhaler, Use 4 puffs every 4-6 hours as needed for cough or wheeze, Disp: 18 g, Rfl: 2 .  allopurinol (ZYLOPRIM) 300 MG tablet, Take 300 mg by mouth daily.  , Disp: , Rfl:  .  amLODipine (NORVASC) 10 MG tablet, TAKE 1 TABLET EVERY DAY, Disp: 90 tablet, Rfl: 3 .  atorvastatin (LIPITOR) 20 MG tablet, Take 20 mg by mouth daily. , Disp: , Rfl:  .  azelastine (ASTELIN) 0.1 % nasal spray, Place 1 spray into both nostrils 2 (two) times daily. Use in each nostril as directed, Disp: , Rfl:  .  clopidogrel (PLAVIX) 75 MG tablet, TAKE (1) TABLET BY MOUTH ONCE DAILY., Disp: 30 tablet, Rfl: 0 .  diclofenac Sodium (VOLTAREN) 1 % GEL, Apply 4 g topically 4 (four) times daily., Disp: 4 g, Rfl: 5 .  Fluticasone-Umeclidin-Vilant (TRELEGY ELLIPTA) 100-62.5-25 MCG/INH AEPB, Inhale 1 puff into the lungs daily., Disp: 180 each, Rfl: 1 .  glipiZIDE (GLUCOTROL XL) 5 MG 24 hr tablet, Take 1 tablet (5 mg total) by mouth daily with breakfast., Disp: 30 tablet, Rfl: 3 .  hydrALAZINE (APRESOLINE) 50 MG tablet, TAKE (1) TABLET TWICE DAILY., Disp: 180 tablet, Rfl: 2 .  metFORMIN (GLUCOPHAGE) 500 MG tablet, Take 1,000 mg by mouth 2 (two) times daily with a meal. , Disp: , Rfl:  .  methocarbamol (ROBAXIN) 500 MG tablet, Take 1 tablet (500 mg total) by mouth  3 (three) times daily., Disp: 60 tablet, Rfl: 1 .  metoprolol succinate (TOPROL-XL) 25 MG 24 hr tablet, TAKE 1 TABLET EVERY DAY, Disp: 90 tablet, Rfl: 3 .  omalizumab (XOLAIR) 150 MG injection, Inject 375 mg into the skin every 14 (fourteen) days., Disp: 6 each, Rfl: 11 .  predniSONE (DELTASONE) 10 MG tablet, Take 1 tablet (10 mg total) by mouth 2 (two) times daily with a meal., Disp: 60 tablet, Rfl: 0 .   sertraline (ZOLOFT) 50 MG tablet, Take 50 mg by mouth daily.  , Disp: , Rfl:  .  torsemide (DEMADEX) 20 MG tablet, Take 1 tablet (20 mg total) by mouth 2 (two) times daily., Disp: 90 tablet, Rfl: 1 .  losartan (COZAAR) 100 MG tablet, TAKE 1 TABLET (100 MG TOTAL) BY MOUTH DAILY., Disp: 90 tablet, Rfl: 2  Current Facility-Administered Medications:  .  ipratropium (ATROVENT) nebulizer solution 0.5 mg, 0.5 mg, Nebulization, Once, Ishmael Holter, Dominga Ferry, MD .  levalbuterol [facility]) nebulizer solution 1.25 mg, 1.25 mg, Nebulization, Once, Gean Quint, MD .  omalizumab Arvid Right) injection 375 mg, 375 mg, Subcutaneous, Q14 Days, Valentina Shaggy, MD, 375 mg at 08/05/19 0953   PHYSICAL EXAM SECTION: 1) BP 124/70   Pulse 89   Temp (!) 97.3 F (36.3 C)   Ht 5\' 10"  (1.778 m)   Wt 228 lb (103.4 kg)   BMI 32.71 kg/m   Body mass index is 32.71 kg/m. General appearance: Well-developed well-nourished no gross deformities  2) Cardiovascular normal pulse and perfusion , normal color   3) Neurologically deep tendon reflexes are equal and normal, no sensation loss or deficits no pathologic reflexes  4) Psychological: Awake alert and oriented x3 mood and affect normal  5) Skin no lacerations or ulcerations no nodularity no palpable masses, no erythema or nodularity  6) Musculoskeletal: Right shoulder  Patient has global loss of motion pain and tenderness over the anterior shoulder joint.  Left shoulder no pain tenderness or swelling full range of motion normal muscle tone and strength MEDICAL DECISION MAKING  A.  Encounter Diagnoses  Name Primary?  . Right shoulder pain, unspecified chronicity Yes  . Bursitis of right shoulder     B. DATA ANALYSED:   Imaging x-rays show mild break in [place]'s line suggesting chronic rotator cuff disease   Procedure note the subacromial injection shoulder RIGHT    Verbal consent was obtained to inject the  RIGHT   Shoulder  Timeout was completed  to confirm the injection site is a subacromial space of the  RIGHT  shoulder   Medication used Depo-Medrol 40 mg and lidocaine 1% 3 cc  Anesthesia was provided by ethyl chloride  The injection was performed in the RIGHT  posterior subacromial space. After pinning the skin with alcohol and anesthetized the skin with ethyl chloride the subacromial space was injected using a 20-gauge needle. There were no complications  Sterile dressing was applied.  Encounter Diagnoses  Name Primary?  . Right shoulder pain, unspecified chronicity Yes  . Bursitis of right shoulder    Acute uncomplicated injury, injection no prescriptions   No orders of the defined types were placed in this encounter.     Arther Abbott, MD  08/22/2019 10:33 AM

## 2019-08-24 ENCOUNTER — Other Ambulatory Visit: Payer: Self-pay | Admitting: Emergency Medicine

## 2019-08-24 ENCOUNTER — Ambulatory Visit: Payer: Self-pay

## 2019-08-24 DIAGNOSIS — E785 Hyperlipidemia, unspecified: Secondary | ICD-10-CM

## 2019-08-24 MED ORDER — ATORVASTATIN CALCIUM 20 MG PO TABS: 20.0000 mg | ORAL_TABLET | Freq: Every day | ORAL | 1 refills | Status: DC

## 2019-08-25 ENCOUNTER — Telehealth: Payer: Self-pay | Admitting: Gastroenterology

## 2019-08-25 MED ORDER — PEG 3350-KCL-NA BICARB-NACL 420 G PO SOLR: 4000.0000 mL | ORAL | 0 refills | Status: DC

## 2019-08-25 NOTE — Addendum Note (Signed)
Addended by: Hassan Rowan on: 08/25/2019 02:11 PM   Modules accepted: Orders

## 2019-08-25 NOTE — Telephone Encounter (Signed)
Rx for prep sent to [facility]. Wife is aware.

## 2019-08-25 NOTE — Telephone Encounter (Signed)
[phone] PLEASE CALL PATIENT   WIFE CALLED ASKING WHERE HIS PRESCRIPTION FOR THE PREP WAS SENT BECAUSE THEY DONT KNOW WHERE TO PICK IT UP, HUSBAND CAN NOT REMEMBER WHERE HE TOLD us TO SEND IT

## 2019-08-26 ENCOUNTER — Other Ambulatory Visit: Payer: Self-pay

## 2019-08-26 ENCOUNTER — Ambulatory Visit (INDEPENDENT_AMBULATORY_CARE_PROVIDER_SITE_OTHER): Payer: Medicare HMO

## 2019-08-26 DIAGNOSIS — J4540 Moderate persistent asthma, uncomplicated: Secondary | ICD-10-CM | POA: Diagnosis not present

## 2019-08-26 MED ORDER — TRELEGY ELLIPTA 100-62.5-25 MCG/INH IN AEPB: 1.0000 | INHALATION_SPRAY | Freq: Every day | RESPIRATORY_TRACT | 1 refills | Status: DC

## 2019-08-29 ENCOUNTER — Telehealth: Payer: Self-pay | Admitting: Family Medicine

## 2019-08-29 ENCOUNTER — Other Ambulatory Visit: Payer: Self-pay | Admitting: Family Medicine

## 2019-08-29 MED ORDER — SERTRALINE HCL 50 MG PO TABS: 50.0000 mg | ORAL_TABLET | Freq: Every day | ORAL | 1 refills | Status: DC

## 2019-08-29 MED ORDER — LOSARTAN POTASSIUM 100 MG PO TABS: 100.0000 mg | ORAL_TABLET | Freq: Every day | ORAL | 0 refills | Status: DC

## 2019-08-29 NOTE — Telephone Encounter (Signed)
I forgot to ask was it ok to send in the losartan as well for this patient

## 2019-08-29 NOTE — Telephone Encounter (Signed)
Is it ok to send in rx for sertraline

## 2019-08-29 NOTE — Telephone Encounter (Signed)
Sent rx for zoloft. Would like to see pt for follow up-tele--visit in Dec-3 month follow up this month

## 2019-08-29 NOTE — Telephone Encounter (Signed)
Kevin Frank is calling from [facility] requesting refills on the following medications.  sertraline (ZOLOFT) 50 MG tablet  losartan (COZAAR) 100 MG tablet    [place], [place] Phone:  [phone]  Fax:  [phone]

## 2019-08-30 ENCOUNTER — Telehealth: Payer: Self-pay | Admitting: Family Medicine

## 2019-08-30 NOTE — Telephone Encounter (Signed)
Pt wife is calling and states the kidney specialist called to schedule the pt an appt and the wife is unsure why he is being referred to kidney specialist.

## 2019-08-30 NOTE — Telephone Encounter (Signed)
Please advise 

## 2019-08-31 ENCOUNTER — Other Ambulatory Visit: Payer: Self-pay | Admitting: Emergency Medicine

## 2019-09-02 NOTE — Patient Instructions (Addendum)
Kevin Frank  09/02/2019     @PREFPERIOPPHARMACY @   Your procedure is scheduled on  09/08/2019  Report to [facility] at  0800   A.M.  Call this number if you have problems the morning of surgery:  [phone]   Remember:  Follow the diet and prep instructions given to you by Dr Roseanne Kaufman office.                     Take these medicines the morning of surgery with A SIP OF WATER  Allopurinol, amlodipine, metoprolol, zoloft. DO NOT take any medications for diabetes the morning of your procedure.    Do not wear jewelry, make-up or nail polish.  Do not wear lotions, powders, or perfumes. Please wear deodorant and brush your teeth.  Do not shave 48 hours prior to surgery.  Men may shave face and neck.  Do not bring valuables to the hospital.  [facility] is not responsible for any belongings or valuables.  Contacts, dentures or bridgework may not be worn into surgery.  Leave your suitcase in the car.  After surgery it may be brought to your room.  For patients admitted to the hospital, discharge time will be determined by your treatment team.  Patients discharged the day of surgery will not be allowed to drive home.   Name and phone number of your driver:   family Special instructions:  DO NOT smoke the morning of your procedure.  Please read over the following fact sheets that you were given. Anesthesia Post-op Instructions and Care and Recovery After Surgery       Colonoscopy, Adult, Care After This sheet gives you information about how to care for yourself after your procedure. Your health care provider may also give you more specific instructions. If you have problems or questions, contact your health care provider. What can I expect after the procedure? After the procedure, it is common to have:  A small amount of blood in your stool for 24 hours after the procedure.  Some gas.  Mild cramping or bloating of your abdomen. Follow these instructions at  home: Eating and drinking   Drink enough fluid to keep your urine pale yellow.  Follow instructions from your health care provider about eating or drinking restrictions.  Resume your normal diet as instructed by your health care provider. Avoid heavy or fried foods that are hard to digest. Activity  Rest as told by your health care provider.  Avoid sitting for a long time without moving. Get up to take short walks every 1-2 hours. This is important to improve blood flow and breathing. Ask for help if you feel weak or unsteady.  Return to your normal activities as told by your health care provider. Ask your health care provider what activities are safe for you. Managing cramping and bloating   Try walking around when you have cramps or feel bloated.  Apply heat to your abdomen as told by your health care provider. Use the heat source that your health care provider recommends, such as a moist heat pack or a heating pad. ? Place a towel between your skin and the heat source. ? Leave the heat on for 20-30 minutes. ? Remove the heat if your skin turns bright red. This is especially important if you are unable to feel pain, heat, or cold. You may have a greater risk of getting burned. General instructions  For the first 24 hours  after the procedure: ? Do not drive or use machinery. ? Do not sign important documents. ? Do not drink alcohol. ? Do your regular daily activities at a slower pace than normal. ? Eat soft foods that are easy to digest.  Take over-the-counter and prescription medicines only as told by your health care provider.  Keep all follow-up visits as told by your health care provider. This is important. Contact a health care provider if:  You have blood in your stool 2-3 days after the procedure. Get help right away if you have:  More than a small spotting of blood in your stool.  Large blood clots in your stool.  Swelling of your abdomen.  Nausea or  vomiting.  A fever.  Increasing pain in your abdomen that is not relieved with medicine. Summary  After the procedure, it is common to have a small amount of blood in your stool. You may also have mild cramping and bloating of your abdomen.  For the first 24 hours after the procedure, do not drive or use machinery, sign important documents, or drink alcohol.  Get help right away if you have a lot of blood in your stool, nausea or vomiting, a fever, or increased pain in your abdomen. This information is not intended to replace advice given to you by your health care provider. Make sure you discuss any questions you have with your health care provider. Document Revised: 01/03/2019 Document Reviewed: 01/03/2019 Elsevier Patient Education  [place] After These instructions provide you with information about caring for yourself after your procedure. Your health care provider may also give you more specific instructions. Your treatment has been planned according to current medical practices, but problems sometimes occur. Call your health care provider if you have any problems or questions after your procedure. What can I expect after the procedure? After your procedure, you may:  Feel sleepy for several hours.  Feel clumsy and have poor balance for several hours.  Feel forgetful about what happened after the procedure.  Have poor judgment for several hours.  Feel nauseous or vomit.  Have a sore throat if you had a breathing tube during the procedure. Follow these instructions at home: For at least 24 hours after the procedure:      Have a responsible adult stay with you. It is important to have someone help care for you until you are awake and alert.  Rest as needed.  Do not: ? Participate in activities in which you could fall or become injured. ? Drive. ? Use heavy machinery. ? Drink alcohol. ? Take sleeping pills or medicines that  cause drowsiness. ? Make important decisions or sign legal documents. ? Take care of children on your own. Eating and drinking  Follow the diet that is recommended by your health care provider.  If you vomit, drink water, juice, or soup when you can drink without vomiting.  Make sure you have little or no nausea before eating solid foods. General instructions  Take over-the-counter and prescription medicines only as told by your health care provider.  If you have sleep apnea, surgery and certain medicines can increase your risk for breathing problems. Follow instructions from your health care provider about wearing your sleep device: ? Anytime you are sleeping, including during daytime naps. ? While taking prescription pain medicines, sleeping medicines, or medicines that make you drowsy.  If you smoke, do not smoke without supervision.  Keep all follow-up visits as told  by your health care provider. This is important. Contact a health care provider if:  You keep feeling nauseous or you keep vomiting.  You feel light-headed.  You develop a rash.  You have a fever. Get help right away if:  You have trouble breathing. Summary  For several hours after your procedure, you may feel sleepy and have poor judgment.  Have a responsible adult stay with you for at least 24 hours or until you are awake and alert. This information is not intended to replace advice given to you by your health care provider. Make sure you discuss any questions you have with your health care provider. Document Revised: 09/07/2017 Document Reviewed: 09/30/2015 Elsevier Patient Education  [place].

## 2019-09-05 ENCOUNTER — Telehealth: Payer: Self-pay | Admitting: Family Medicine

## 2019-09-05 NOTE — Telephone Encounter (Signed)
Patient wife called and said that patient is needing a refill on all of his medications sent to [facility] mail order and said that Mcarthur Rossetti has sent a request multiple times. Can you check on this.

## 2019-09-06 ENCOUNTER — Encounter (HOSPITAL_COMMUNITY)
Admission: RE | Admit: 2019-09-06 | Discharge: 2019-09-06 | Disposition: A | Discharge status: Home / Self Care | Payer: Medicare HMO | Source: Ambulatory Visit | Attending: Internal Medicine | Admitting: Internal Medicine

## 2019-09-06 ENCOUNTER — Other Ambulatory Visit: Payer: Self-pay | Admitting: Nurse Practitioner

## 2019-09-06 ENCOUNTER — Other Ambulatory Visit (HOSPITAL_COMMUNITY)
Admission: RE | Admit: 2019-09-06 | Discharge: 2019-09-06 | Disposition: A | Discharge status: Home / Self Care | Payer: Medicare HMO | Source: Ambulatory Visit | Attending: Internal Medicine | Admitting: Internal Medicine

## 2019-09-06 ENCOUNTER — Other Ambulatory Visit: Payer: Self-pay

## 2019-09-06 DIAGNOSIS — Z20822 Contact with and (suspected) exposure to covid-19: Secondary | ICD-10-CM | POA: Diagnosis not present

## 2019-09-06 DIAGNOSIS — Z01812 Encounter for preprocedural laboratory examination: Secondary | ICD-10-CM | POA: Insufficient documentation

## 2019-09-06 DIAGNOSIS — E119 Type 2 diabetes mellitus without complications: Secondary | ICD-10-CM | POA: Diagnosis not present

## 2019-09-06 LAB — CBC
HCT: 46.7 % (ref 39.0–52.0)
Hemoglobin: 15.3 g/dL (ref 13.0–17.0)
MCH: 32.4 pg (ref 26.0–34.0)
MCHC: 32.8 g/dL (ref 30.0–36.0)
MCV: 98.9 fL (ref 80.0–100.0)
Platelets: 199 10*3/uL (ref 150–400)
RBC: 4.72 MIL/uL (ref 4.22–5.81)
RDW: 14.6 % (ref 11.5–15.5)
WBC: 10.6 10*3/uL — ABNORMAL HIGH (ref 4.0–10.5)
nRBC: 0 % (ref 0.0–0.2)

## 2019-09-06 LAB — BASIC METABOLIC PANEL
Anion gap: 11 (ref 5–15)
BUN: 13 mg/dL (ref 8–23)
CO2: 23 mmol/L (ref 22–32)
Calcium: 9.3 mg/dL (ref 8.9–10.3)
Chloride: 102 mmol/L (ref 98–111)
Creatinine, Ser: 1.03 mg/dL (ref 0.61–1.24)
GFR calc Af Amer: >60 mL/min (ref >60)
GFR calc non Af Amer: >60 mL/min (ref >60)
Glucose, Bld: 205 mg/dL — ABNORMAL HIGH (ref 70–99)
Potassium: 3.6 mmol/L (ref 3.5–5.1)
Sodium: 136 mmol/L (ref 135–145)

## 2019-09-06 LAB — SARS CORONAVIRUS 2 (TAT 6-24 HRS): SARS Coronavirus 2: NEGATIVE

## 2019-09-06 MED ORDER — AMLODIPINE BESYLATE 10 MG PO TABS: 10.0000 mg | ORAL_TABLET | Freq: Every day | ORAL | 0 refills | Status: DC

## 2019-09-06 MED ORDER — METOPROLOL SUCCINATE ER 25 MG PO TB24: 25.0000 mg | ORAL_TABLET | Freq: Every day | ORAL | 0 refills | Status: DC

## 2019-09-06 NOTE — Telephone Encounter (Signed)
Left a msg on machine that all med that Dr Holly Bodily prescribe has been sent, some sent to [facility] ans some was sent to [facility].

## 2019-09-06 NOTE — Telephone Encounter (Signed)
Patient wife called back and said she is needing to know which medications went to which pharmacy. Also states he is out of the medications below and needs those sent to the pharmacy below for a 30 day supply.  allopurinol (ZYLOPRIM) 300 MG tablet   clopidogrel (PLAVIX) 75 MG tablet    [facility] - [place], [place] - [place] Phone:  [phone]

## 2019-09-06 NOTE — Telephone Encounter (Signed)
Pt's medication was sent to a local pharmacy for a 2 week supply until mail order arrives, as requested. Confirmation received.

## 2019-09-07 ENCOUNTER — Telehealth: Payer: Self-pay | Admitting: Internal Medicine

## 2019-09-07 ENCOUNTER — Other Ambulatory Visit: Payer: Self-pay

## 2019-09-07 ENCOUNTER — Other Ambulatory Visit: Payer: Self-pay | Admitting: Emergency Medicine

## 2019-09-07 ENCOUNTER — Ambulatory Visit: Payer: Medicare HMO

## 2019-09-07 ENCOUNTER — Encounter: Payer: Self-pay | Admitting: Family Medicine

## 2019-09-07 ENCOUNTER — Ambulatory Visit: Payer: Medicare HMO | Admitting: Orthopedic Surgery

## 2019-09-07 ENCOUNTER — Ambulatory Visit (INDEPENDENT_AMBULATORY_CARE_PROVIDER_SITE_OTHER): Payer: Medicare HMO | Admitting: Family Medicine

## 2019-09-07 ENCOUNTER — Telehealth: Payer: Self-pay | Admitting: Orthopedic Surgery

## 2019-09-07 VITALS — BP 131/61 | HR 83 | Temp 97.6°F | Ht 70.0 in | Wt 233.6 lb

## 2019-09-07 VITALS — BP 147/68 | HR 60 | Ht 70.0 in | Wt 233.0 lb

## 2019-09-07 DIAGNOSIS — M79631 Pain in right forearm: Secondary | ICD-10-CM | POA: Insufficient documentation

## 2019-09-07 DIAGNOSIS — M25531 Pain in right wrist: Secondary | ICD-10-CM | POA: Diagnosis not present

## 2019-09-07 DIAGNOSIS — I5032 Chronic diastolic (congestive) heart failure: Secondary | ICD-10-CM

## 2019-09-07 DIAGNOSIS — M109 Gout, unspecified: Secondary | ICD-10-CM

## 2019-09-07 MED ORDER — ALLOPURINOL 300 MG PO TABS: 300.0000 mg | ORAL_TABLET | Freq: Every day | ORAL | 1 refills | Status: DC

## 2019-09-07 MED ORDER — CLOPIDOGREL BISULFATE 75 MG PO TABS: 75.0000 mg | ORAL_TABLET | Freq: Every day | ORAL | 1 refills | Status: DC

## 2019-09-07 NOTE — Telephone Encounter (Signed)
Patient want refill on below meds the clopidogrel was last filled by Dr Donzetta Matters and the allopurinol have not been filled by you. Is it ok to fill these meds.

## 2019-09-07 NOTE — Telephone Encounter (Signed)
Patient called back to relay that the prescription he has at home is:  Colcrys 0.6 mg tablets; [place] - states has 5 refills through 02/11/20

## 2019-09-07 NOTE — Telephone Encounter (Signed)
4 x a day for the nesxt week

## 2019-09-07 NOTE — Telephone Encounter (Signed)
PA for TCS submitted via [employer] website. Case approved. [employer]# [id number], valid 11/24/19-12/24/19.

## 2019-09-07 NOTE — Progress Notes (Signed)
Established Patient Office Visit  Subjective:  Patient ID: Kevin Frank, male    DOB: 1941-05-20  Age: 79 y.o. MRN: [id number]  CC:  Chief Complaint  Patient presents with  . Wrist Pain    right wrist pain for the past week or two with no know injury. Swelling occured today Patient has a hx of gout. Need a referral to DR Aline Brochure    HPI Kevin Frank presents for right wrist pain with swelling -3 days -worsening today. No injury. 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 Pt states he needs a referral to see Dr. Lamont Dowdy injection into the right shoulder has not helped his ROM.   Pt with glucose readings 140's fasting-metformin twice a day  Past Medical History:  Diagnosis Date  . AAA (abdominal aortic aneurysm) ([place]) 01/2008   3.2cm  . Allergy   . Arthritis   . Asthma   . BPH (benign prostatic hyperplasia)   . Chronic knee pain    bilateral  . COPD (chronic obstructive pulmonary disease) ([place])   . Diabetes mellitus    type II  . Gout   . History of kidney stones   . Hypercholesterolemia   . Hyperlipidemia   . Hypertension   . 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 Normal nuclear stress test 2011  . Obesity   . Obstructive sleep apnea on CPAP   . Presence of indwelling urinary catheter   . Shortness of breath    due to cigarette abuse  . Tobacco abuse     Past Surgical History:  Procedure Laterality Date  . ABDOMINAL AORTIC ENDOVASCULAR FENESTRATED STENT GRAFT N/A 04/09/2016   Procedure: ABDOMINAL AORTIC ENDOVASCULAR FENESTRATED STENT GRAFT with left brachial artery access using ultrasound guidance;  Surgeon: Serafina Mitchell, MD;  Location: [facility];  Service: Vascular;  Laterality: N/A;  . [place]   [place]  . COLONOSCOPY  2006   2006: multiple diminutive polyps in rectum, one pedunculated polyp at 10 cm s/p snare removal. Left-sided diverticulum, pedunculated polyps at 35 cm  and splenic flexure s/p snare removal. No path available.   [name] PERIPHERAL VASCULAR CATHETERIZATION N/A 04/10/2016   Procedure: Renal Angiography;  Surgeon: Angelia Mould, MD;  Location: [facility];  Service: Cardiovascular;  Laterality: N/A;  . TRANSURETHRAL RESECTION OF PROSTATE  01/07/2011   Procedure: TRANSURETHRAL RESECTION OF THE PROSTATE (TURP);  Surgeon: Marissa Nestle;  Location: [facility];  Service: Urology;  Laterality: N/A;    Family History  Problem Relation Age of Onset  . Liver disease Father   . Lymphoma Mother   . Diabetes Mother   . Hypertension Mother   . Cancer Mother   . Mental retardation Other        sibling -- from birth   . Asthma Daughter   . Allergic rhinitis Neg Hx   . Angioedema Neg Hx   . Atopy Neg Hx   . Eczema Neg Hx   . Immunodeficiency Neg Hx   . Colon cancer Neg Hx     Social History   Socioeconomic History  . Marital status: Married    Spouse name: Not on file  . Number of children: Not on file  . Years of education: Not on file  . Highest education level: Not on file  Occupational History  . Not on file  Tobacco Use  . Smoking  status: Current Every Day Smoker    Packs/day: 3.00    Years: 50.00    Pack years: 150.00    Types: Cigarettes  . Smokeless tobacco: Never Used  . Tobacco comment: 2ppd 10/30/2016 ee  Substance and Sexual Activity  . Alcohol use: Yes    Alcohol/week: 0.0 standard drinks    Comment: very little  . Drug use: No  . Sexual activity: Not on file  Other Topics Concern  . Not on file  Social History Narrative  . Not on file   Social Determinants of Health   Financial Resource Strain:   . Difficulty of Paying Living Expenses:   Food Insecurity:   . Worried About Charity fundraiser in the Last Year:   . Arboriculturist in the Last Year:   Transportation Needs:   . Film/video editor (Medical):   [name] Lack of Transportation (Non-Medical):   Physical Activity:   . Days of Exercise per Week:    . Minutes of Exercise per Session:   Stress:   . Feeling of Stress :   Social Connections:   . Frequency of Communication with Friends and Family:   . Frequency of Social Gatherings with Friends and Family:   . Attends Religious Services:   . Active Member of Clubs or Organizations:   . Attends Archivist Meetings:   [name] Marital Status:   Intimate Partner Violence:   . Fear of Current or Ex-Partner:   . Emotionally Abused:   [name] Physically Abused:   . Sexually Abused:     Outpatient Medications Prior to Visit  Medication Sig Dispense Refill  . albuterol (VENTOLIN HFA) 108 (90 Base) MCG/ACT inhaler Use 4 puffs every 4-6 hours as needed for cough or wheeze 18 g 2  . allopurinol (ZYLOPRIM) 300 MG tablet Take 1 tablet (300 mg total) by mouth daily. 30 tablet 1  . amLODipine (NORVASC) 10 MG tablet Take 1 tablet (10 mg total) by mouth daily. 14 tablet 0  . atorvastatin (LIPITOR) 20 MG tablet Take 1 tablet (20 mg total) by mouth daily. 90 tablet 1  . clopidogrel (PLAVIX) 75 MG tablet Take 1 tablet (75 mg total) by mouth daily. 30 tablet 1  . diclofenac Sodium (VOLTAREN) 1 % GEL Apply 4 g topically 4 (four) times daily. 4 g 5  . Fluticasone-Umeclidin-Vilant (TRELEGY ELLIPTA) 100-62.5-25 MCG/INH AEPB Inhale 1 puff into the lungs daily. 180 each 1  . glipiZIDE (GLUCOTROL XL) 5 MG 24 hr tablet Take 1 tablet (5 mg total) by mouth daily with breakfast. 30 tablet 3  . hydrALAZINE (APRESOLINE) 50 MG tablet TAKE (1) TABLET TWICE DAILY. (Patient taking differently: Take 50 mg by mouth in the morning and at bedtime. ) 180 tablet 2  . losartan (COZAAR) 100 MG tablet Take 1 tablet (100 mg total) by mouth daily. 30 tablet 0  . metFORMIN (GLUCOPHAGE) 500 MG tablet Take 1,000 mg by mouth 2 (two) times daily with a meal.     . methocarbamol (ROBAXIN) 500 MG tablet Take 1 tablet (500 mg total) by mouth 3 (three) times daily. 60 tablet 1  . metoprolol succinate (TOPROL-XL) 25 MG 24 hr tablet Take 1  tablet (25 mg total) by mouth daily. 14 tablet 0  . omalizumab (XOLAIR) 150 MG injection Inject 375 mg into the skin every 14 (fourteen) days. 6 each 11  . polyethylene glycol-electrolytes (TRILYTE) 420 g solution Take 4,000 mLs by mouth as directed. 4000 mL 0  .  predniSONE (DELTASONE) 10 MG tablet Take 1 tablet (10 mg total) by mouth 2 (two) times daily with a meal. 60 tablet 0  . sertraline (ZOLOFT) 50 MG tablet Take 1 tablet (50 mg total) by mouth daily. 30 tablet 1  . torsemide (DEMADEX) 20 MG tablet Take 1 tablet (20 mg total) by mouth 2 (two) times daily. (Patient taking differently: Take 20 mg by mouth daily. ) 90 tablet 1   Facility-Administered Medications Prior to Visit  Medication Dose Route Frequency Provider Last Rate Last Admin  . ipratropium (ATROVENT) nebulizer solution 0.5 mg  0.5 mg Nebulization Once Gean Quint, MD      . levalbuterol Penne Lash) nebulizer solution 1.25 mg  1.25 mg Nebulization Once Gean Quint, MD      . omalizumab Arvid Right) injection 375 mg  375 mg Subcutaneous Q14 Days Valentina Shaggy, MD   375 mg at 08/26/19 8563    Allergies  Allergen Reactions  . No Known Allergies     ROS Review of Systems  Musculoskeletal: Positive for arthralgias and joint swelling.       Wrist and forearm pain and swelling      Objective:    Physical Exam  Constitutional: He appears well-developed and well-nourished.  Cardiovascular: Normal rate, regular rhythm and normal heart sounds.  Pulmonary/Chest: Effort normal and breath sounds normal.  Musculoskeletal:        General: Tenderness and edema present.       Arms:     Comments: Swelling and point tenderness right forearm and right wrist, swelling right hand    BP 131/61 (BP Location: Left Arm, Patient Position: Sitting, Cuff Size: Large)   Pulse 83   Temp 97.6 F (36.4 C) (Temporal)   Ht 5\' 10"  (1.778 m)   Wt 233 lb 9.6 oz (106 kg)   SpO2 95%   BMI 33.52 kg/m  Wt Readings from Last 3  Encounters:  09/07/19 233 lb 9.6 oz (106 kg)  09/06/19 225 lb (102.1 kg)  08/22/19 228 lb (103.4 kg)     Health Maintenance Due  Topic Date Due  . OPHTHALMOLOGY EXAM  Never done  . FOOT EXAM  12/08/2018     Lab Results  Component Value Date   TSH 1.15 03/23/2019   Lab Results  Component Value Date   WBC 10.6 (H) 09/06/2019   HGB 15.3 09/06/2019   HCT 46.7 09/06/2019   MCV 98.9 09/06/2019   PLT 199 09/06/2019   Lab Results  Component Value Date   NA 136 09/06/2019   K 3.6 09/06/2019   CO2 23 09/06/2019   GLUCOSE 205 (H) 09/06/2019   BUN 13 09/06/2019   CREATININE 1.03 09/06/2019   BILITOT 0.6 04/26/2019   ALKPHOS 91 04/26/2019   AST 15 04/26/2019   ALT 19 04/26/2019   PROT 7.3 04/26/2019   ALBUMIN 3.7 04/26/2019   CALCIUM 9.3 09/06/2019   ANIONGAP 11 09/06/2019   GFR 78.53 12/16/2010   Lab Results  Component Value Date   CHOL 117 03/23/2019   Lab Results  Component Value Date   HDL 39 03/23/2019   Lab Results  Component Value Date   LDLCALC 61 03/23/2019   Lab Results  Component Value Date   TRIG 89 03/23/2019   Lab Results  Component Value Date   CHOLHDL 3 12/16/2010   Lab Results  Component Value Date   HGBA1C 8.1 (A) 06/27/2019      Assessment & Plan:   1. Wrist pain,  acute, right - AMB referral to orthopedics - DG Wrist 2 Views Right; Future - DG Forearm Right; Future Elevation, ice 2. Right forearm pain Elevation , ice - DG Forearm Right; Future Follow-up: Dr. Raynaldo Opitz Hannah Beat, MD

## 2019-09-07 NOTE — Telephone Encounter (Signed)
Rx has been sent  

## 2019-09-07 NOTE — Telephone Encounter (Signed)
[place] for #30 with 2 r/f

## 2019-09-07 NOTE — Telephone Encounter (Signed)
I called him to advise. He has voiced understanding

## 2019-09-07 NOTE — Patient Instructions (Signed)
Call the office back to let me know if you are taking the colchicine or Uloric  If the office is close call [phone]  Wear the splint until you see me next week you can take it off to bathe

## 2019-09-07 NOTE — Telephone Encounter (Signed)
Mr. Stan called in and wanted a work in appointment for right wrist pain.  This would be a new problem.  I told that Dr Aline Brochure did not have any openings for today.  I suggested to him that he call his PCP and see if she could see him today.  He said he would do this.

## 2019-09-07 NOTE — Telephone Encounter (Signed)
Called pt's wife, he has appt with PCP today. TCS w/Propofol w/RMR scheduled for 11/24/19 at 1:15pm. Endo scheduler informed. Pre-op and COVID test 11/22/19. Letter mailed with new procedure instructions.

## 2019-09-07 NOTE — Telephone Encounter (Signed)
[phone] PATIENT WIFE CALLED TO RESCHEDULE PATIENT PROCEDURE, HE WOKE UIP AND CAN NOT MOVE ON ONE SIDE

## 2019-09-07 NOTE — Progress Notes (Signed)
Chief Complaint  Patient presents with  . Wrist Pain    Right, swelling feels hot    79 year old male with history of gout presents with 3-day history of pain in his wrist 1 day history of swelling right upper extremity mainly the right hand wrist and forearm  No history of trauma  Review of systems no numbness or tingling  BP (!) 147/68   Pulse 60   Ht 5\' 10"  (1.778 m)   Wt 233 lb (105.7 kg)   BMI 33.43 kg/m   Temperature was 97.3 urgent referral came in from primary care  Right wrist is swollen and tender at the joint first extensor compartment and the hand compared to the left this does affect some of his grip strength and range of motion neurovascular exam is intact  X-ray was negative for any acute process  Impression gout attack right wrist  Recommend splinting and start colchicine  They will call us back to make sure that the medicine that he started yesterday.  He will take that until the symptoms start to come down  Encounter Diagnosis  Name Primary?  . Pain in right wrist Yes    Gout right wrist acute process uncomplicated

## 2019-09-07 NOTE — Patient Instructions (Signed)
Xray right wrist and forearm Referral to Dr. Aline Brochure

## 2019-09-09 ENCOUNTER — Other Ambulatory Visit: Payer: Self-pay

## 2019-09-09 ENCOUNTER — Ambulatory Visit (INDEPENDENT_AMBULATORY_CARE_PROVIDER_SITE_OTHER): Payer: Medicare HMO

## 2019-09-09 DIAGNOSIS — J4540 Moderate persistent asthma, uncomplicated: Secondary | ICD-10-CM

## 2019-09-12 DIAGNOSIS — L570 Actinic keratosis: Secondary | ICD-10-CM | POA: Diagnosis not present

## 2019-09-13 DIAGNOSIS — M109 Gout, unspecified: Secondary | ICD-10-CM | POA: Diagnosis not present

## 2019-09-13 DIAGNOSIS — Z6832 Body mass index (BMI) 32.0-32.9, adult: Secondary | ICD-10-CM | POA: Diagnosis not present

## 2019-09-13 DIAGNOSIS — J449 Chronic obstructive pulmonary disease, unspecified: Secondary | ICD-10-CM | POA: Diagnosis not present

## 2019-09-13 DIAGNOSIS — I10 Essential (primary) hypertension: Secondary | ICD-10-CM | POA: Diagnosis not present

## 2019-09-13 DIAGNOSIS — Z1389 Encounter for screening for other disorder: Secondary | ICD-10-CM | POA: Diagnosis not present

## 2019-09-13 DIAGNOSIS — E119 Type 2 diabetes mellitus without complications: Secondary | ICD-10-CM | POA: Diagnosis not present

## 2019-09-19 ENCOUNTER — Other Ambulatory Visit: Payer: Self-pay

## 2019-09-19 ENCOUNTER — Encounter: Payer: Self-pay | Admitting: Orthopedic Surgery

## 2019-09-19 ENCOUNTER — Ambulatory Visit: Payer: Medicare HMO | Admitting: Orthopedic Surgery

## 2019-09-19 VITALS — BP 157/77 | HR 99 | Ht 70.0 in | Wt 233.0 lb

## 2019-09-19 DIAGNOSIS — G8929 Other chronic pain: Secondary | ICD-10-CM | POA: Diagnosis not present

## 2019-09-19 DIAGNOSIS — M25531 Pain in right wrist: Secondary | ICD-10-CM | POA: Diagnosis not present

## 2019-09-19 DIAGNOSIS — M25511 Pain in right shoulder: Secondary | ICD-10-CM

## 2019-09-19 MED ORDER — COLCRYS 0.6 MG PO TABS: 0.6000 mg | ORAL_TABLET | Freq: Two times a day (BID) | ORAL | 0 refills | Status: DC

## 2019-09-19 NOTE — Patient Instructions (Signed)
You have received an injection of steroids into the joint. 15% of patients will have increased pain within the 24 hours postinjection.   This is transient and will go away.   We recommend that you use ice packs on the injection site for 20 minutes every 2 hours and extra strength Tylenol 2 tablets every 8 as needed until the pain resolves.  If you continue to have pain after taking the Tylenol and using the ice please call the office for further instructions.  Take the Colcrys twice a day for the next week wear the splint x7 days  Follow-up in 2 weeks

## 2019-09-19 NOTE — Progress Notes (Signed)
Chief Complaint  Patient presents with  . 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 After pinning the skin with alcohol and anesthetized the skin with ethyl chloride the subacromial space was injected using a 20-gauge needle. There were no complications  Sterile dressing was applied.  Meds ordered this encounter  Medications  . COLCRYS 0.6 MG tablet    Sig: Take 1 tablet (0.6 mg total) by mouth 2 (two) times daily.    Dispense:  60 tablet    Refill:  0

## 2019-09-22 ENCOUNTER — Telehealth: Payer: Self-pay | Admitting: Nurse Practitioner

## 2019-09-22 NOTE — Telephone Encounter (Signed)
I spoke with patient who reports he has figured it out and has no questions at this time.

## 2019-09-22 NOTE — Telephone Encounter (Signed)
New message:     Pt says he have some questions about his new medicine(Hydralazine. He also wants to know if it is taking the place of one of his other medicine?

## 2019-09-28 ENCOUNTER — Telehealth: Payer: Self-pay | Admitting: Orthopedic Surgery

## 2019-09-28 ENCOUNTER — Other Ambulatory Visit: Payer: Self-pay | Admitting: Orthopedic Surgery

## 2019-09-28 MED ORDER — COLCHICINE 0.6 MG PO CAPS: 1.0000 | ORAL_CAPSULE | Freq: Two times a day (BID) | ORAL | 5 refills | Status: DC

## 2019-09-28 NOTE — Telephone Encounter (Signed)
Patient came to office with notice received in mail from [facility] regarding medication Colcrys 0.6mg  tablet/Colchicine 0.6mg  tablet, indicating it is not covered under his plan. Notice shows alternative drug option: Mitigare Capsule  - please advise - [employer] / [place] Review [phone] / [phone]* copy of notic in Dr Ruthe Mannan box if needed

## 2019-09-30 ENCOUNTER — Other Ambulatory Visit: Payer: Self-pay

## 2019-09-30 ENCOUNTER — Ambulatory Visit (INDEPENDENT_AMBULATORY_CARE_PROVIDER_SITE_OTHER): Payer: Medicare HMO

## 2019-09-30 DIAGNOSIS — J4540 Moderate persistent asthma, uncomplicated: Secondary | ICD-10-CM

## 2019-10-02 NOTE — Progress Notes (Addendum)
Chief Complaint  Patient presents with  . Wrist Pain    R/bothering me for last few days    Encounter Diagnoses  Name Primary?  . Pain in right wrist Yes  . Chronic right shoulder pain     79 year old male chronic pain right shoulder status post injection.  Not a great surgical candidate due to multiple medical problems.  We are also seeing him for presumed gout in the right wrist.  He is on colchicine alternative medication.  Kevin Frank is doing well with his right shoulder on review of systems no complaints there  His right wrist is still hurting him.  He is on Mitigare and allopurinol.  He did do better with the brace on.  BP (!) 155/86   Pulse 65   Temp 97.6 F (36.4 C)   Ht 5\' 10"  (1.778 m)   Wt 233 lb 6 oz (105.9 kg)   BMI 33.49 kg/m   Right wrist exam  No swelling or warmth to the joint compared to the left however he is tender in the joint and the radiocarpal region with painful extension flexion but no restriction in range of motion neurovascular exam is intact skin is normal  Recommend continue Mitigare/colchicine and allopurinol Brace 6 weeks Follow-up 4 weeks  Meds ordered this encounter  Medications  . Colchicine (MITIGARE) 0.6 MG CAPS    Sig: Take 1 capsule by mouth in the morning and at bedtime.    Dispense:  60 capsule    Refill:  5

## 2019-10-03 ENCOUNTER — Ambulatory Visit (INDEPENDENT_AMBULATORY_CARE_PROVIDER_SITE_OTHER): Payer: Medicare HMO | Admitting: Orthopedic Surgery

## 2019-10-03 ENCOUNTER — Other Ambulatory Visit: Payer: Self-pay

## 2019-10-03 ENCOUNTER — Encounter: Payer: Self-pay | Admitting: Orthopedic Surgery

## 2019-10-03 VITALS — BP 155/86 | HR 65 | Temp 97.6°F | Ht 70.0 in | Wt 233.4 lb

## 2019-10-03 DIAGNOSIS — M25531 Pain in right wrist: Secondary | ICD-10-CM | POA: Diagnosis not present

## 2019-10-03 DIAGNOSIS — G4700 Insomnia, unspecified: Secondary | ICD-10-CM | POA: Diagnosis not present

## 2019-10-03 DIAGNOSIS — M25511 Pain in right shoulder: Secondary | ICD-10-CM

## 2019-10-03 DIAGNOSIS — G8929 Other chronic pain: Secondary | ICD-10-CM

## 2019-10-03 DIAGNOSIS — Z6833 Body mass index (BMI) 33.0-33.9, adult: Secondary | ICD-10-CM | POA: Diagnosis not present

## 2019-10-03 DIAGNOSIS — R5383 Other fatigue: Secondary | ICD-10-CM | POA: Diagnosis not present

## 2019-10-03 DIAGNOSIS — R8290 Unspecified abnormal findings in urine: Secondary | ICD-10-CM | POA: Diagnosis not present

## 2019-10-03 DIAGNOSIS — M109 Gout, unspecified: Secondary | ICD-10-CM | POA: Diagnosis not present

## 2019-10-03 MED ORDER — COLCHICINE 0.6 MG PO CAPS: 1.0000 | ORAL_CAPSULE | Freq: Two times a day (BID) | ORAL | 5 refills | Status: DC

## 2019-10-03 NOTE — Patient Instructions (Addendum)
Wear brace as much as possible for 6 weeks   Take the colcrys daily  Smoking Tobacco Information, Adult Smoking tobacco can be harmful to your health. Tobacco contains a poisonous (toxic), colorless chemical called nicotine. Nicotine is addictive. It changes the brain and can make it hard to stop smoking. Tobacco also has other toxic chemicals that can hurt your body and raise your risk of many cancers. How can smoking tobacco affect me? Smoking tobacco puts you at risk for:  Cancer. Smoking is most commonly associated with lung cancer, but can also lead to cancer in other parts of the body.  Chronic obstructive pulmonary disease (COPD). This is a long-term lung condition that makes it hard to breathe. It also gets worse over time.  High blood pressure (hypertension), heart disease, stroke, or heart attack.  Lung infections, such as pneumonia.  Cataracts. This is when the lenses in the eyes become clouded.  Digestive problems. This may include peptic ulcers, heartburn, and gastroesophageal reflux disease (GERD).  Oral health problems, such as gum disease and tooth loss.  Loss of taste and smell. Smoking can affect your appearance by causing:  Wrinkles.  Yellow or stained teeth, fingers, and fingernails. Smoking tobacco can also affect your social life, because:  It may be challenging to find places to smoke when away from home. Many workplaces, [employer], hotels, and public places are tobacco-free.  Smoking is expensive. This is due to the cost of tobacco and the long-term costs of treating health problems from smoking.  Secondhand smoke may affect those around you. Secondhand smoke can cause lung cancer, breathing problems, and heart disease. Children of smokers have a higher risk for: ? Sudden infant death syndrome (SIDS). ? Ear infections. ? Lung infections. If you currently smoke tobacco, quitting now can help you:  Lead a longer and healthier life.  Look, smell,  breathe, and feel better over time.  Save money.  Protect others from the harms of secondhand smoke. What actions can I take to prevent health problems? Quit smoking   Do not start smoking. Quit if you already do.  Make a plan to quit smoking and commit to it. Look for programs to help you and ask your health care provider for recommendations and ideas.  Set a date and write down all the reasons you want to quit.  Let your friends and family know you are quitting so they can help and support you. Consider finding friends who also want to quit. It can be easier to quit with someone else, so that you can support each other.  Talk with your health care provider about using nicotine replacement medicines to help you quit, such as gum, lozenges, patches, sprays, or pills.  Do not replace cigarette smoking with electronic cigarettes, which are commonly called e-cigarettes. The safety of e-cigarettes is not known, and some may contain harmful chemicals.  If you try to quit but return to smoking, stay positive. It is common to slip up when you first quit, so take it one day at a time.  Be prepared for cravings. When you feel the urge to smoke, chew gum or suck on hard candy. Lifestyle  Stay busy and take care of your body.  Drink enough fluid to keep your urine pale yellow.  Get plenty of exercise and eat a healthy diet. This can help prevent weight gain after quitting.  Monitor your eating habits. Quitting smoking can cause you to have a larger appetite than when you smoke.  Find ways to relax. Go out with friends or family to a movie or a restaurant where people do not smoke.  Ask your health care provider about having regular tests (screenings) to check for cancer. This may include blood tests, imaging tests, and other tests.  Find ways to manage your stress, such as meditation, yoga, or exercise. Where to find support To get support to quit smoking, consider:  Asking your health  care provider for more information and resources.  Taking classes to learn more about quitting smoking.  Looking for local organizations that offer resources about quitting smoking.  Joining a support group for people who want to quit smoking in your local community.  Calling the smokefree.gov counselor helpline: 1-800-Quit-Now 778-123-1553) Where to find more information You may find more information about quitting smoking from:  HelpGuide.org: www.helpguide.org  https://hall.com/: smokefree.gov  American Lung Association: www.lung.org Contact a health care provider if you:  Have problems breathing.  Notice that your lips, nose, or fingers turn blue.  Have chest pain.  Are coughing up blood.  Feel faint or you pass out.  Have other health changes that cause you to worry. Summary  Smoking tobacco can negatively affect your health, the health of those around you, your finances, and your social life.  Do not start smoking. Quit if you already do. If you need help quitting, ask your health care provider.  Think about joining a support group for people who want to quit smoking in your local community. There are many effective programs that will help you to quit this behavior. This information is not intended to replace advice given to you by your health care provider. Make sure you discuss any questions you have with your health care provider. Document Revised: 03/04/2019 Document Reviewed: 06/24/2016 Elsevier Patient Education  2020 [employer].

## 2019-10-04 NOTE — Progress Notes (Signed)
CARDIOLOGY OFFICE NOTE  Date:  10/11/2019    Kevin Frank Date of Birth: 1940/09/23 Medical Record #[id number]  PCP:  Maryruth Hancock, MD  Cardiologist:  Jerel Shepherd    Chief Complaint  Patient presents with  . Follow-up    History of Present Illness: Kevin Frank is a 79 y.o. male who presents today for a follow up visit. Seen for Dr. Burt Knack. He is a former patient of Dr. Susa Simmonds.Primarily follows with me.  He has a history of hypertension, hyperlipidemia, DM, ongoing tobacco abuse, COPD, and abdominal aortic aneurysm.On 04/09/2016 he underwent FEVARwith a single rightrenal fenestration by Dr. Trula Slade. He also had left renal angioplasty at the time of the main body deployment service to ensure that the graft not cover the renal artery because of angulation. The day following his procedure, his creatinine increased and ultrasound did not show good perfusion to the left kidney and therefore he went to the Cath Lab and had stenting of his left renal artery. His creatinine trended down and his urine output improved. The patient also had issues with left leg numbness in his legs after the procedure. Neurology was consulted. It was felt this was most likely secondary to lower back disease that was aggravated during surgery.  He continues to smoke cigarettes. He has not had any known CADsymptoms but has had prior CT scan showing left main and 3VD.He has been on chronic Plavix.He has had lung nodules noted as well.He had progressive shortness of breath in 2019 - found to have lung cancer - has had XRT - felt to be poor candidate for surgical intervention- saw EBG.Hehascontinued to smoke. Echo was updated and showed worsening pulmonary HTN.  We did a telehealth visit back in early June - he was doing ok. Had had follow up CT showing minimal decreasein his lung nodule. Cardiac status was ok. Last seen back in November - lots of somatic issues - wife worried  about possible "heart failure" diagnosis. He has continued to smoke - no intention of stopping.  .  The patient does not have symptoms concerning for COVID-19 infection (fever, chills, cough, or new shortness of breath).   Comes in today. Here alone. Only taking his diuretic once a day due to urinary frequency. Will be getting another new PCP - he can't think of the name but has someone set up. He is staying busy. For repeat scan next week for his lung cancer. He is for colonoscopy in June - apparently had heme + stool noted. He has not seen any blood. More issues with gout and his knees - that is his primary issue. But he says he is staying busy. No chest pain. Says his breathing is stable. Continues to smoke - has a pack in his shirt pocket - no intentions of stopping. Has had both COVID vaccines.   Past Medical History:  Diagnosis Date  . AAA (abdominal aortic aneurysm) ([place]) 01/2008   3.2cm  . Allergy   . Arthritis   . Asthma   . BPH (benign prostatic hyperplasia)   . Chronic knee pain    bilateral  . COPD (chronic obstructive pulmonary disease) ([place])   . Diabetes mellitus    type II  . Gout   . History of kidney stones   . Hypercholesterolemia   . Hyperlipidemia   . Hypertension   . 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  Normal nuclear stress test 2011  . Obesity   . Obstructive sleep apnea on CPAP   . Presence of indwelling urinary catheter   . Shortness of breath    due to cigarette abuse  . Tobacco abuse     Past Surgical History:  Procedure Laterality Date  . ABDOMINAL AORTIC ENDOVASCULAR FENESTRATED STENT GRAFT N/A 04/09/2016   Procedure: ABDOMINAL AORTIC ENDOVASCULAR FENESTRATED STENT GRAFT with left brachial artery access using ultrasound guidance;  Surgeon: Serafina Mitchell, MD;  Location: [facility];  Service: Vascular;  Laterality: N/A;  . [place]   [place]  . COLONOSCOPY  2006   2006: multiple diminutive  polyps in rectum, one pedunculated polyp at 10 cm s/p snare removal. Left-sided diverticulum, pedunculated polyps at 35 cm and splenic flexure s/p snare removal. No path available.   [name] PERIPHERAL VASCULAR CATHETERIZATION N/A 04/10/2016   Procedure: Renal Angiography;  Surgeon: Angelia Mould, MD;  Location: [facility];  Service: Cardiovascular;  Laterality: N/A;  . TRANSURETHRAL RESECTION OF PROSTATE  01/07/2011   Procedure: TRANSURETHRAL RESECTION OF THE PROSTATE (TURP);  Surgeon: Marissa Nestle;  Location: [facility];  Service: Urology;  Laterality: N/A;     Medications: Current Meds  Medication Sig  . albuterol (VENTOLIN HFA) 108 (90 Base) MCG/ACT inhaler Use 4 puffs every 4-6 hours as needed for cough or wheeze  . allopurinol (ZYLOPRIM) 300 MG tablet Take 1 tablet (300 mg total) by mouth daily.  [name] amLODipine (NORVASC) 10 MG tablet Take 1 tablet (10 mg total) by mouth daily.  [name] atorvastatin (LIPITOR) 20 MG tablet Take 1 tablet (20 mg total) by mouth daily.  . clopidogrel (PLAVIX) 75 MG tablet Take 1 tablet (75 mg total) by mouth daily.  . Colchicine (MITIGARE) 0.6 MG CAPS Take 1 capsule by mouth in the morning and at bedtime.  . diclofenac Sodium (VOLTAREN) 1 % GEL Apply 4 g topically 4 (four) times daily.  . Fluticasone-Umeclidin-Vilant (TRELEGY ELLIPTA) 100-62.5-25 MCG/INH AEPB Inhale 1 puff into the lungs daily.  [name] glipiZIDE (GLUCOTROL XL) 5 MG 24 hr tablet Take 1 tablet (5 mg total) by mouth daily with breakfast.  . hydrALAZINE (APRESOLINE) 50 MG tablet TAKE (1) TABLET TWICE DAILY.  [name] losartan (COZAAR) 100 MG tablet TAKE 1 TABLET BY MOUTH ONCE DAILY.  . metFORMIN (GLUCOPHAGE) 500 MG tablet Take 1,000 mg by mouth 2 (two) times daily with a meal.   . methocarbamol (ROBAXIN) 500 MG tablet Take 1 tablet (500 mg total) by mouth 3 (three) times daily.  . metoprolol succinate (TOPROL-XL) 25 MG 24 hr tablet Take 1 tablet (25 mg total) by mouth daily.  [name] omalizumab (XOLAIR) 150 MG  injection Inject 375 mg into the skin every 14 (fourteen) days.  . polyethylene glycol-electrolytes (TRILYTE) 420 g solution Take 4,000 mLs by mouth as directed.  . sertraline (ZOLOFT) 50 MG tablet Take 1 tablet (50 mg total) by mouth daily.  [name] torsemide (DEMADEX) 20 MG tablet Take 20 mg by mouth daily.   Current Facility-Administered Medications for the 10/11/19 encounter (Office Visit) with Burtis Junes, NP  Medication  . ipratropium (ATROVENT) nebulizer solution 0.5 mg  . levalbuterol (XOPENEX) nebulizer solution 1.25 mg  . omalizumab Arvid Right) injection 375 mg     Allergies: Allergies  Allergen Reactions  . No Known Allergies     Social History: The patient  reports that he has been smoking cigarettes. He has a 150.00 pack-year smoking history. He has never used smokeless  tobacco. He reports current alcohol use. He reports that he does not use drugs.   Family History: The patient's family history includes Asthma in his daughter; Cancer in his mother; Diabetes in his mother; Hypertension in his mother; Liver disease in his father; Lymphoma in his mother; Mental retardation in an other family member.   Review of Systems: Please see the history of present illness.   All other systems are reviewed and negative.   Physical Exam: VS:  BP 128/60   Pulse 64   Ht 5\' 10"  (1.778 m)   Wt 235 lb 6.4 oz (106.8 kg)   SpO2 92%   BMI 33.78 kg/m  .  BMI Body mass index is 33.78 kg/m.  Wt Readings from Last 3 Encounters:  10/11/19 235 lb 6.4 oz (106.8 kg)  10/03/19 233 lb 6 oz (105.9 kg)  09/19/19 233 lb (105.7 kg)    General: Alert and in no acute distress.  His weight is up 5 pounds since I last saw him in November.  Cardiac: Heart tones are distant. No edema.  Respiratory:  Decreased breath sounds but with fairly normal work of breathing.  GI: Soft and nontender.  MS: No deformity or atrophy. Gait and ROM intact.  Skin: Warm and dry. Color is normal.  Neuro:  Strength and  sensation are intact and no gross focal deficits noted.  Psych: Alert, appropriate and with normal affect.   LABORATORY DATA:  EKG:  EKG is not ordered today.    Lab Results  Component Value Date   WBC 10.6 (H) 09/06/2019   HGB 15.3 09/06/2019   HCT 46.7 09/06/2019   PLT 199 09/06/2019   GLUCOSE 205 (H) 09/06/2019   CHOL 117 03/23/2019   TRIG 89 03/23/2019   HDL 39 03/23/2019   LDLCALC 61 03/23/2019   ALT 19 04/26/2019   AST 15 04/26/2019   NA 136 09/06/2019   K 3.6 09/06/2019   CL 102 09/06/2019   CREATININE 1.03 09/06/2019   BUN 13 09/06/2019   CO2 23 09/06/2019   TSH 1.15 03/23/2019   PSA 2.0 10/09/2016   INR 0.96 03/19/2018   HGBA1C 8.1 (A) 06/27/2019     BNP (last 3 results) No results for input(s): BNP in the last 8760 hours.  ProBNP (last 3 results) No results for input(s): PROBNP in the last 8760 hours.   Other Studies Reviewed Today:  CT CHEST 04/2019 IMPRESSION: 1. Stable appearance of posterior right upper lobe lung lesion. 2. No new or progressive disease identified. 3. Emphysema and aortic atherosclerosis. Multi vessel coronary artery calcifications noted.  Aortic Atherosclerosis (ICD10-I70.0) and Emphysema (ICD10-J43.9).   Electronically Signed By: Kerby Moors M.D. On: 04/26/2019 11:27   EchoStudy Conclusions8/2019  - Left ventricle: The cavity size was normal. There was mild concentric hypertrophy. Systolic function was normal. The estimated ejection fraction was in the range of 55% to 60%. Wall motion was normal; there were no regional wall motion abnormalities. Features are consistent with a pseudonormal left ventricular filling pattern, with concomitant abnormal relaxation and increased filling pressure (grade 2 diastolic dysfunction). - Aortic valve: Sclerosis without stenosis. There was no regurgitation. - Mitral valve: There was trivial regurgitation. - Left atrium: The atrium was mildly  dilated. - Right ventricle: The cavity size was moderately dilated. Wall thickness was normal. Systolic function was normal. RV systolic pressure (S, est): 53 mm Hg. - Atrial septum: No defect or patent foramen ovale was identified. - Tricuspid valve: There was trivial regurgitation. - Pulmonic  valve: There was no significant regurgitation. - Pulmonary arteries: Systolic pressure was moderately to severely increased. PA peak pressure: 53 mm Hg (S).  Impressions:  - Normal LV systolic function with grade II diastolic dysfunction. Elevated right sided filling pressures, consistent with pulmonary hypertension.   Myoview Study Highlights from 02/2016   Nuclear stress EF: 58%.  There was no ST segment deviation noted during stress.  The study is normal.  This is a low risk study.     ASSESSMENT &PLAN:    1. HTN - BP ok. No changes made today.   2. Long standing tobacco abuse - no intention of stopping.   3. Squamous cell lung carcinoma - for repeat scan next week.   4. Heme + stool - for colonoscopy in June - he will be at increased risk just given his pulmonary/cardiac situation - fortunately no worrisome symptoms. Would be ok to hold his Plavix for 5 days prior.   5. HLD - on statin therapy  6. Diastolic dysfunction - he loves salt - fortunately breathing is fairly stable - weight is up a few pounds but I think this is all diet related and not volume overload. EF is normal.   7. Prior AAA/FEVAR with stenting of the left renal artery - followed by [facility] - not discussed today.   8. CAD - based on prior CT's - no active symptoms - on chronic Plavix - would favor medical management - he is not going to make life style changes.   9. Chronic knee pain/swelling - would still favor conservative approach - would not favor surgical intervention given his multiple medical issues.   10. COVID-19 Education: The signs and symptoms of COVID-19 were discussed with  the patient and how to seek care for testing (follow up with PCP or arrange E-visit).  The importance of social distancing, staying at home, hand hygiene and wearing a mask when out in public were discussed today. He has had both vaccines.   Current medicines are reviewed with the patient today.  The patient does not have concerns regarding medicines other than what has been noted above.  The following changes have been made:  See above.  Labs/ tests ordered today include:   No orders of the defined types were placed in this encounter.    Disposition:   FU with me in 4 months.   Patient is agreeable to this plan and will call if any problems develop in the interim.   SignedTruitt Merle, NP  10/11/2019 10:38 AM  [facility] [address] [place], [place]  [address] Phone: [phone] Fax: [phone]

## 2019-10-06 ENCOUNTER — Ambulatory Visit: Payer: Medicare HMO | Admitting: "Endocrinology

## 2019-10-06 ENCOUNTER — Ambulatory Visit: Payer: Medicare HMO | Admitting: Nutrition

## 2019-10-06 ENCOUNTER — Other Ambulatory Visit: Payer: Self-pay | Admitting: Family Medicine

## 2019-10-11 ENCOUNTER — Ambulatory Visit: Payer: Medicare HMO | Admitting: Nurse Practitioner

## 2019-10-11 ENCOUNTER — Encounter: Payer: Self-pay | Admitting: Nurse Practitioner

## 2019-10-11 ENCOUNTER — Other Ambulatory Visit: Payer: Self-pay

## 2019-10-11 VITALS — BP 128/60 | HR 64 | Ht 70.0 in | Wt 235.4 lb

## 2019-10-11 DIAGNOSIS — Z7189 Other specified counseling: Secondary | ICD-10-CM

## 2019-10-11 DIAGNOSIS — F17200 Nicotine dependence, unspecified, uncomplicated: Secondary | ICD-10-CM | POA: Diagnosis not present

## 2019-10-11 DIAGNOSIS — C3490 Malignant neoplasm of unspecified part of unspecified bronchus or lung: Secondary | ICD-10-CM | POA: Diagnosis not present

## 2019-10-11 DIAGNOSIS — I5032 Chronic diastolic (congestive) heart failure: Secondary | ICD-10-CM | POA: Diagnosis not present

## 2019-10-11 DIAGNOSIS — E7800 Pure hypercholesterolemia, unspecified: Secondary | ICD-10-CM

## 2019-10-11 DIAGNOSIS — I10 Essential (primary) hypertension: Secondary | ICD-10-CM

## 2019-10-11 NOTE — Patient Instructions (Addendum)
After Visit Summary:  We will be checking the following labs today - NONE   Medication Instructions:    Continue with your current medicines.    If you need a refill on your cardiac medications before your next appointment, please call your pharmacy.     Testing/Procedures To Be Arranged:  N/A  Follow-Up:   See me in about 4 months    At [facility], you and your health needs are our priority.  As part of our continuing mission to provide you with exceptional heart care, we have created designated Provider Care Teams.  These Care Teams include your primary Cardiologist (physician) and Advanced Practice Providers (APPs -  Physician Assistants and Nurse Practitioners) who all work together to provide you with the care you need, when you need it.  Special Instructions:  . Stay safe, stay home, wash your hands for at least 20 seconds and wear a mask when out in public.  . It was good to talk with you today.    Call the [place] office at [phone] if you have any questions, problems or concerns.

## 2019-10-14 ENCOUNTER — Ambulatory Visit (INDEPENDENT_AMBULATORY_CARE_PROVIDER_SITE_OTHER): Payer: Medicare HMO

## 2019-10-14 ENCOUNTER — Other Ambulatory Visit: Payer: Self-pay

## 2019-10-14 DIAGNOSIS — J4540 Moderate persistent asthma, uncomplicated: Secondary | ICD-10-CM

## 2019-10-18 ENCOUNTER — Other Ambulatory Visit: Payer: Self-pay

## 2019-10-18 ENCOUNTER — Ambulatory Visit (HOSPITAL_COMMUNITY)
Admission: RE | Admit: 2019-10-18 | Discharge: 2019-10-18 | Disposition: A | Discharge status: Home / Self Care | Payer: Medicare HMO | Source: Ambulatory Visit | Attending: Hematology | Admitting: Hematology

## 2019-10-18 ENCOUNTER — Inpatient Hospital Stay (HOSPITAL_COMMUNITY): Payer: Medicare HMO

## 2019-10-18 DIAGNOSIS — C3411 Malignant neoplasm of upper lobe, right bronchus or lung: Secondary | ICD-10-CM | POA: Diagnosis not present

## 2019-10-18 DIAGNOSIS — C3491 Malignant neoplasm of unspecified part of right bronchus or lung: Secondary | ICD-10-CM | POA: Diagnosis not present

## 2019-10-18 MED ORDER — IOHEXOL 300 MG/ML  SOLN
75.0000 mL | Freq: Once | INTRAMUSCULAR | Status: AC | PRN
  Administered 2019-10-18: 75 mL via INTRAVENOUS

## 2019-10-20 ENCOUNTER — Inpatient Hospital Stay (HOSPITAL_COMMUNITY): Payer: Medicare HMO | Attending: Hematology | Admitting: Hematology

## 2019-10-20 ENCOUNTER — Other Ambulatory Visit: Payer: Self-pay

## 2019-10-20 ENCOUNTER — Encounter (HOSPITAL_COMMUNITY): Payer: Self-pay | Admitting: Hematology

## 2019-10-20 VITALS — BP 123/65 | HR 68 | Temp 97.1°F | Resp 18 | Wt 233.3 lb

## 2019-10-20 DIAGNOSIS — C3411 Malignant neoplasm of upper lobe, right bronchus or lung: Secondary | ICD-10-CM | POA: Diagnosis not present

## 2019-10-20 DIAGNOSIS — F17210 Nicotine dependence, cigarettes, uncomplicated: Secondary | ICD-10-CM | POA: Diagnosis not present

## 2019-10-20 DIAGNOSIS — R599 Enlarged lymph nodes, unspecified: Secondary | ICD-10-CM | POA: Insufficient documentation

## 2019-10-20 DIAGNOSIS — J449 Chronic obstructive pulmonary disease, unspecified: Secondary | ICD-10-CM | POA: Diagnosis not present

## 2019-10-20 DIAGNOSIS — C3491 Malignant neoplasm of unspecified part of right bronchus or lung: Secondary | ICD-10-CM | POA: Diagnosis not present

## 2019-10-20 DIAGNOSIS — R918 Other nonspecific abnormal finding of lung field: Secondary | ICD-10-CM | POA: Diagnosis not present

## 2019-10-20 NOTE — Assessment & Plan Note (Addendum)
1.  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 -He was felt not to be a surgical candidate. -SBRT to the right lung lesion from 05/05/2018 through 05/12/2018, 54 Gray in 3 fractions of 18 [place] Cables. -He is continuing to smoke cigarettes daily.  Wife reports that he is sleeping a lot during daytime. -He is not using CPAP on a consistent basis.  He does not use it when he sleeps in the recliner.  I have asked him to use the CPAP on a daily basis. -We reviewed results of the CT of the chest with contrast dated 10/18/2019 which showed stable treated apical right upper lobe pulmonary nodule with no findings suspicious for metastatic disease.  Mildly enlarged right paratracheal lymph node and additional bilateral subcentimeters lung nodules are stable. -I have reviewed labs from 10/03/2019 from Dr. Delanna Ahmadi office.  White count is 9.9.  Hemoglobin is 15.5 and platelet count is 199.  LFTs and creatinine are normal. -I plan to see him back in 6 months with repeat labs and scan.

## 2019-10-20 NOTE — Patient Instructions (Addendum)
[place] at [facility] Discharge Instructions  You were seen today by Dr. Delton Coombes. He went over your recent test results. Your scan was stable and showed no growth. He will see you back in 6 months for labs, CT Chest and follow up.   Thank you for choosing [place] at [facility] to provide your oncology and hematology care.  To afford each patient quality time with our provider, please arrive at least 15 minutes before your scheduled appointment time.   If you have a lab appointment with the [facility] please come in thru the  Main Entrance and check in at the main information desk  You need to re-schedule your appointment should you arrive 10 or more minutes late.  We strive to give you quality time with our providers, and arriving late affects you and other patients whose appointments are after yours.  Also, if you no show three or more times for appointments you may be dismissed from the clinic at the providers discretion.     Again, thank you for choosing [facility].  Our hope is that these requests will decrease the amount of time that you wait before being seen by our physicians.       _____________________________________________________________  Should you have questions after your visit to [facility], please contact our office at [phone] between the hours of 8:00 a.m. and 4:30 p.m.  Voicemails left after 4:00 p.m. will not be returned until the following business day.  For prescription refill requests, have your pharmacy contact our office and allow 72 hours.    Cancer Center Support Programs:   > Cancer Support Group  2nd Tuesday of the month 1pm-2pm, Journey Room

## 2019-10-20 NOTE — Progress Notes (Signed)
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  BRIEF ONCOLOGIC HISTORY:  Oncology History  Squamous cell carcinoma of right lung ([facility])  03/29/2018 Initial Diagnosis   Squamous cell carcinoma of right lung ([facility])   03/29/2018 Cancer Staging   Staging form: Lung, AJCC 8th Edition - Clinical stage from 03/29/2018: Stage IA3 (cT1c, cN0, cM0) - Signed by Derek Jack, MD on 03/29/2018      CANCER STAGING: Cancer Staging Squamous cell carcinoma of right lung [facility]) Staging form: Lung, AJCC 8th Edition - Clinical stage from 03/29/2018: Stage IA3 (cT1c, cN0, cM0) - Signed by Derek Jack, MD on 03/29/2018    INTERVAL HISTORY:  Kevin Frank 79 y.o. male seen for follow-up of right lung cancer.  He is able to do his day-to-day activities.  Wife reports that he is sleeping a lot lately.  He is continuing to smoke.  Denies any new onset chest pains.  No weight loss reported.  Appetite is 100%.  Energy levels are 50%.  Chronic cough and shortness of breath from COPD stable. No hemoptysis reported.  REVIEW OF SYSTEMS:  Review of Systems  Respiratory: Positive for cough and shortness of breath.   Cardiovascular: Positive for leg swelling.  All other systems reviewed and are negative.    PAST MEDICAL/SURGICAL HISTORY:  Past Medical History:  Diagnosis Date  . AAA (abdominal aortic aneurysm) ([facility] [facility]) 01/2008   3.2cm  . Allergy   . Arthritis   . Asthma   . BPH (benign prostatic hyperplasia)   . Chronic knee pain    bilateral  . COPD (chronic obstructive pulmonary disease) ([place])   . Diabetes mellitus    type II  . Gout   . History of kidney stones   . Hypercholesterolemia   . Hyperlipidemia   . Hypertension   . Lung  cancer ([facility]) 2019   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   . Normal nuclear stress test 2011  . Obesity   . Obstructive sleep apnea on CPAP   . Presence of indwelling urinary catheter   . Shortness of breath    due to cigarette abuse  . Tobacco abuse    Past Surgical History:  Procedure Laterality Date  . ABDOMINAL AORTIC ENDOVASCULAR FENESTRATED STENT GRAFT N/A 04/09/2016   Procedure: ABDOMINAL AORTIC ENDOVASCULAR FENESTRATED STENT GRAFT with left brachial artery access using ultrasound guidance;  Surgeon: Serafina Mitchell, MD;  Location: [facility];  Service: Vascular;  Laterality: N/A;  . [place]   [place]  . COLONOSCOPY  2006   2006: multiple diminutive polyps in rectum, one pedunculated polyp at 10 cm s/p snare removal. Left-sided diverticulum, pedunculated polyps at 35 cm and splenic flexure s/p snare removal. No path available.   [name] PERIPHERAL VASCULAR CATHETERIZATION N/A 04/10/2016   Procedure: Renal Angiography;  Surgeon: Angelia Mould, MD;  Location: [facility];  Service: Cardiovascular;  Laterality: N/A;  . TRANSURETHRAL RESECTION OF PROSTATE  01/07/2011   Procedure: TRANSURETHRAL RESECTION OF THE PROSTATE (TURP);  Surgeon: Marissa Nestle;  Location: [facility];  Service: Urology;  Laterality: N/A;     SOCIAL HISTORY:  Social History   Socioeconomic History  . Marital status: Married    Spouse name: Not  on file  . Number of children: Not on file  . Years of education: Not on file  . Highest education level: Not on file  Occupational History  . Not on file  Tobacco Use  . Smoking status: Current Every Day Smoker    Packs/day: 3.00    Years: 50.00    Pack years: 150.00    Types: Cigarettes  . Smokeless tobacco: Never Used  . Tobacco comment: 2ppd 10/30/2016 ee  Substance and Sexual Activity  . Alcohol use: Yes    Alcohol/week: 0.0 standard drinks    Comment: very little  . Drug use: No  . Sexual activity: Not on file    Other Topics Concern  . Not on file  Social History Narrative  . Not on file   Social Determinants of Health   Financial Resource Strain:   . Difficulty of Paying Living Expenses:   Food Insecurity:   . Worried About Charity fundraiser in the Last Year:   . Arboriculturist in the Last Year:   Transportation Needs:   . Film/video editor (Medical):   [name] Lack of Transportation (Non-Medical):   Physical Activity:   . Days of Exercise per Week:   . Minutes of Exercise per Session:   Stress:   . Feeling of Stress :   Social Connections:   . Frequency of Communication with Friends and Family:   . Frequency of Social Gatherings with Friends and Family:   . Attends Religious Services:   . Active Member of Clubs or Organizations:   . Attends Archivist Meetings:   [name] Marital Status:   Intimate Partner Violence:   . Fear of Current or Ex-Partner:   . Emotionally Abused:   [name] Physically Abused:   . Sexually Abused:     FAMILY HISTORY:  Family History  Problem Relation Age of Onset  . Liver disease Father   . Lymphoma Mother   . Diabetes Mother   . Hypertension Mother   . Cancer Mother   . Mental retardation Other        sibling -- from birth   . Asthma Daughter   . Allergic rhinitis Neg Hx   . Angioedema Neg Hx   . Atopy Neg Hx   . Eczema Neg Hx   . Immunodeficiency Neg Hx   . Colon cancer Neg Hx     CURRENT MEDICATIONS:  Outpatient Encounter Medications as of 10/20/2019  Medication Sig  . allopurinol (ZYLOPRIM) 300 MG tablet Take 1 tablet (300 mg total) by mouth daily.  [name] amLODipine (NORVASC) 10 MG tablet Take 1 tablet (10 mg total) by mouth daily.  [name] atorvastatin (LIPITOR) 20 MG tablet Take 1 tablet (20 mg total) by mouth daily.  . clopidogrel (PLAVIX) 75 MG tablet Take 1 tablet (75 mg total) by mouth daily.  . Colchicine (MITIGARE) 0.6 MG CAPS Take 1 capsule by mouth in the morning and at bedtime.  . Fluticasone-Umeclidin-Vilant (TRELEGY ELLIPTA)  100-62.5-25 MCG/INH AEPB Inhale 1 puff into the lungs daily.  [name] glipiZIDE (GLUCOTROL XL) 5 MG 24 hr tablet Take 1 tablet (5 mg total) by mouth daily with breakfast.  . hydrALAZINE (APRESOLINE) 50 MG tablet TAKE (1) TABLET TWICE DAILY.  [name] losartan (COZAAR) 100 MG tablet TAKE 1 TABLET BY MOUTH ONCE DAILY.  . metFORMIN (GLUCOPHAGE) 500 MG tablet Take 1,000 mg by mouth 2 (two) times daily with a meal.   . methocarbamol (ROBAXIN) 500 MG tablet Take 1  tablet (500 mg total) by mouth 3 (three) times daily.  . metoprolol succinate (TOPROL-XL) 25 MG 24 hr tablet Take 1 tablet (25 mg total) by mouth daily.  [name] omalizumab (XOLAIR) 150 MG injection Inject 375 mg into the skin every 14 (fourteen) days.  [name] sertraline (ZOLOFT) 50 MG tablet Take 1 tablet (50 mg total) by mouth daily.  [name] torsemide (DEMADEX) 20 MG tablet Take 20 mg by mouth daily.  [name] albuterol (VENTOLIN HFA) 108 (90 Base) MCG/ACT inhaler Use 4 puffs every 4-6 hours as needed for cough or wheeze (Patient not taking: Reported on 10/20/2019)  . diclofenac Sodium (VOLTAREN) 1 % GEL Apply 4 g topically 4 (four) times daily. (Patient not taking: Reported on 10/20/2019)  . polyethylene glycol-electrolytes (TRILYTE) 420 g solution Take 4,000 mLs by mouth as directed. (Patient not taking: Reported on 10/20/2019)   Facility-Administered Encounter Medications as of 10/20/2019  Medication  . ipratropium (ATROVENT) nebulizer solution 0.5 mg  . levalbuterol (XOPENEX) nebulizer solution 1.25 mg  . omalizumab Arvid Right) injection 375 mg    ALLERGIES:  Allergies  Allergen Reactions  . No Known Allergies      PHYSICAL EXAM:  ECOG Performance status: 1  Vitals:   10/20/19 0816  BP: 123/65  Pulse: 68  Resp: 18  Temp: (!) 97.1 F (36.2 C)  SpO2: 94%   Filed Weights   10/20/19 0816  Weight: 233 lb 4.8 oz (105.8 kg)    Physical Exam Constitutional:      Appearance: Normal appearance. He is normal weight.  Cardiovascular:     Rate and Rhythm: Normal  rate and regular rhythm.     Heart sounds: Normal heart sounds.  Pulmonary:     Effort: Pulmonary effort is normal.     Breath sounds: Normal breath sounds.  Musculoskeletal:        General: Normal range of motion.  Skin:    General: Skin is warm and dry.  Neurological:     Mental Status: He is alert and oriented to person, place, and time. Mental status is at baseline.  Psychiatric:        Mood and Affect: Mood normal.        Behavior: Behavior normal.        Thought Content: Thought content normal.        Judgment: Judgment normal.      LABORATORY DATA:  I have reviewed the labs as listed.  CBC    Component Value Date/Time   WBC 10.6 (H) 09/06/2019 1429   RBC 4.72 09/06/2019 1429   HGB 15.3 09/06/2019 1429   HCT 46.7 09/06/2019 1429   PLT 199 09/06/2019 1429   MCV 98.9 09/06/2019 1429   MCH 32.4 09/06/2019 1429   MCHC 32.8 09/06/2019 1429   RDW 14.6 09/06/2019 1429   LYMPHSABS 1.5 04/26/2019 0954   MONOABS 0.9 04/26/2019 0954   EOSABS 0.1 04/26/2019 0954   BASOSABS 0.1 04/26/2019 0954   CMP Latest Ref Rng & Units 09/06/2019 04/26/2019 03/23/2019  Glucose 70 - 99 mg/dL 205(H) 124(H) -  BUN 8 - 23 mg/dL 13 19 21   Creatinine 0.61 - 1.24 mg/dL 1.03 0.98 1.1  Sodium 135 - 145 mmol/L 136 137 -  Potassium 3.5 - 5.1 mmol/L 3.6 3.9 -  Chloride 98 - 111 mmol/L 102 101 -  CO2 22 - 32 mmol/L 23 25 -  Calcium 8.9 - 10.3 mg/dL 9.3 9.2 -  Total Protein 6.5 - 8.1 g/dL - 7.3 -  Total Bilirubin  0.3 - 1.2 mg/dL - 0.6 -  Alkaline Phos 38 - 126 U/L - 91 -  AST 15 - 41 U/L - 15 -  ALT 0 - 44 U/L - 19 -       DIAGNOSTIC IMAGING:  I have reviewed scans independently.     ASSESSMENT & PLAN:   Squamous cell carcinoma of right lung (HCC) 1.  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 -He was felt not to be a surgical candidate. -SBRT to the right lung lesion from 05/05/2018  through 05/12/2018, 54 Gray in 3 fractions of 18 Pearline Cables. -He is continuing to smoke cigarettes daily.  Wife reports that he is sleeping a lot during daytime. -He is not using CPAP on a consistent basis.  He does not use it when he sleeps in the recliner.  I have asked him to use the CPAP on a daily basis. -We reviewed results of the CT of the chest with contrast dated 10/18/2019 which showed stable treated apical right upper lobe pulmonary nodule with no findings suspicious for metastatic disease.  Mildly enlarged right paratracheal lymph node and additional bilateral subcentimeters lung nodules are stable. -I have reviewed labs from 10/03/2019 from Dr. Delanna Ahmadi office.  White count is 9.9.  Hemoglobin is 15.5 and platelet count is 199.  LFTs and creatinine are normal. -I plan to see him back in 6 months with repeat labs and scan.      Orders placed this encounter:  Orders Placed This Encounter  Procedures  . CT Chest W Contrast  . CBC with Differential  . Comprehensive metabolic panel      Derek Jack, MD [place] [phone]

## 2019-10-21 ENCOUNTER — Telehealth: Payer: Self-pay | Admitting: Nurse Practitioner

## 2019-10-21 DIAGNOSIS — Z6833 Body mass index (BMI) 33.0-33.9, adult: Secondary | ICD-10-CM | POA: Diagnosis not present

## 2019-10-21 DIAGNOSIS — R5383 Other fatigue: Secondary | ICD-10-CM | POA: Diagnosis not present

## 2019-10-21 DIAGNOSIS — J449 Chronic obstructive pulmonary disease, unspecified: Secondary | ICD-10-CM | POA: Diagnosis not present

## 2019-10-21 DIAGNOSIS — I2510 Atherosclerotic heart disease of native coronary artery without angina pectoris: Secondary | ICD-10-CM | POA: Diagnosis not present

## 2019-10-21 DIAGNOSIS — E6609 Other obesity due to excess calories: Secondary | ICD-10-CM | POA: Diagnosis not present

## 2019-10-21 DIAGNOSIS — Z719 Counseling, unspecified: Secondary | ICD-10-CM | POA: Diagnosis not present

## 2019-10-21 NOTE — Telephone Encounter (Signed)
I spoke to the patient's wife who said that they saw Dr Hilma Favors (PCP)  this week and he recommended a stress test and Echo because of the patient's SOB.  They wanted Lori's advisement on this on whether they need to get one, although they just saw Lori 10 days ago.  I told them that I would forward for further advisement.  They verbalized understanding.

## 2019-10-21 NOTE — Telephone Encounter (Signed)
   Pt's wife would like to request to [facility] if she can order stress test and echo for the pt  Please advise

## 2019-10-24 NOTE — Telephone Encounter (Signed)
I am happy to order those tests but he has known extensive emphysema along with lung cancer.which is felt to be the primary etiology of his shortness of breath. Last echo from 2019 with normal pumping function - doubtful this has changed.  He was not an operative candidate for his lung cancer due to his lung status. He has not wished to stop smoking. While he has atherosclerosis noted on prior CT scans, he has not had active chest pain and this is felt to best be managed medically. Again, with smoking cessation.   I would like for my last office note to be sent to his new PCP as FYI - I do not think he had seen this provider when he saw me a few weeks ago.   Burtis Junes, RN, [place] [address] [place], [place]  [address] [phone]

## 2019-10-24 NOTE — Telephone Encounter (Signed)
Follow up    Patient is follow up on Kevin Frank recommendation. She states that a message can be left.

## 2019-10-25 ENCOUNTER — Other Ambulatory Visit: Payer: Self-pay | Admitting: *Deleted

## 2019-10-25 ENCOUNTER — Telehealth (HOSPITAL_COMMUNITY): Payer: Self-pay | Admitting: *Deleted

## 2019-10-25 ENCOUNTER — Telehealth: Payer: Self-pay | Admitting: *Deleted

## 2019-10-25 DIAGNOSIS — R0602 Shortness of breath: Secondary | ICD-10-CM

## 2019-10-25 NOTE — Telephone Encounter (Deleted)
S/w  both spouse and pt

## 2019-10-25 NOTE — Telephone Encounter (Signed)
Per Lori's recommendation's the ov note and previous note have been sent via Epic to Dr. Sharilyn Sites pt's PCP. Will call pt today to let know.

## 2019-10-25 NOTE — Telephone Encounter (Signed)
Sent Lori's recommendation's to pt's PCP Dr. Sharilyn Sites via Epic.  Will call pt today.

## 2019-10-25 NOTE — Telephone Encounter (Signed)
S/w both pt and spouse and would like to do echo and stress test.  Appointment's made, orders in and linked.  Instructions were given.

## 2019-10-25 NOTE — Telephone Encounter (Signed)
Patient's wife, per dpr given detailed instructions per Myocardial Perfusion Study Information Sheet for the test on 10/26/19. Patient notified to arrive 15 minutes early and that it is imperative to arrive on time for appointment to keep from having the test rescheduled.  If you need to cancel or reschedule your appointment, please call the office within 24 hours of your appointment. . Patient verbalized understanding. Kirstie Peri

## 2019-10-26 ENCOUNTER — Encounter (HOSPITAL_COMMUNITY): Payer: Medicare HMO

## 2019-10-26 ENCOUNTER — Other Ambulatory Visit: Payer: Self-pay

## 2019-10-26 ENCOUNTER — Ambulatory Visit (HOSPITAL_COMMUNITY): Payer: Medicare HMO | Attending: Cardiovascular Disease

## 2019-10-26 DIAGNOSIS — R0602 Shortness of breath: Secondary | ICD-10-CM | POA: Diagnosis not present

## 2019-10-26 LAB — MYOCARDIAL PERFUSION IMAGING
LV dias vol: 102 mL (ref 62–150)
LV sys vol: 41 mL
Peak HR: 90 {beats}/min
Rest HR: 60 {beats}/min
SDS: 2
SRS: 0
SSS: 2
TID: 1.09

## 2019-10-26 MED ORDER — REGADENOSON 0.4 MG/5ML IV SOLN
0.4000 mg | Freq: Once | INTRAVENOUS | Status: AC
  Administered 2019-10-26: 0.4 mg via INTRAVENOUS

## 2019-10-26 MED ORDER — TECHNETIUM TC 99M TETROFOSMIN IV KIT
30.7000 | PACK | Freq: Once | INTRAVENOUS | Status: AC | PRN
  Administered 2019-10-26: 30.7 via INTRAVENOUS
  Filled 2019-10-26: qty 31

## 2019-10-26 MED ORDER — TECHNETIUM TC 99M TETROFOSMIN IV KIT
10.1000 | PACK | Freq: Once | INTRAVENOUS | Status: AC | PRN
  Administered 2019-10-26: 10.1 via INTRAVENOUS
  Filled 2019-10-26: qty 11

## 2019-10-28 ENCOUNTER — Telehealth: Payer: Self-pay | Admitting: Nurse Practitioner

## 2019-10-28 ENCOUNTER — Ambulatory Visit (HOSPITAL_COMMUNITY): Payer: Medicare HMO

## 2019-10-28 ENCOUNTER — Other Ambulatory Visit: Payer: Self-pay

## 2019-10-28 ENCOUNTER — Ambulatory Visit (INDEPENDENT_AMBULATORY_CARE_PROVIDER_SITE_OTHER): Payer: Medicare HMO

## 2019-10-28 ENCOUNTER — Other Ambulatory Visit (HOSPITAL_COMMUNITY): Payer: Medicare HMO

## 2019-10-28 DIAGNOSIS — J4540 Moderate persistent asthma, uncomplicated: Secondary | ICD-10-CM

## 2019-10-28 NOTE — Telephone Encounter (Signed)
Returned wife's call (DPR on file). Reviewed result in more detail. Wife appreciates return call and verbalizes understanding.

## 2019-10-28 NOTE — Telephone Encounter (Signed)
[name], wife of the patient called. She would like someone to go over the specifics of her Husband's test results from 10-26-19. All the husband would tell the patient was "everything was fine" but nothing specific. Please call the wife to go over the specifics

## 2019-10-31 ENCOUNTER — Ambulatory Visit: Payer: Medicare HMO

## 2019-10-31 ENCOUNTER — Encounter: Payer: Self-pay | Admitting: Orthopedic Surgery

## 2019-10-31 ENCOUNTER — Other Ambulatory Visit: Payer: Self-pay

## 2019-10-31 ENCOUNTER — Ambulatory Visit (INDEPENDENT_AMBULATORY_CARE_PROVIDER_SITE_OTHER): Payer: Medicare HMO | Admitting: Orthopedic Surgery

## 2019-10-31 VITALS — BP 138/70 | HR 78 | Ht 70.0 in | Wt 230.0 lb

## 2019-10-31 DIAGNOSIS — M79672 Pain in left foot: Secondary | ICD-10-CM | POA: Diagnosis not present

## 2019-10-31 DIAGNOSIS — R609 Edema, unspecified: Secondary | ICD-10-CM | POA: Diagnosis not present

## 2019-10-31 NOTE — Progress Notes (Signed)
Chief Complaint  Patient presents with  . Foot Swelling    left foot swelling painful   . Arm Problem    right shoulder and wrist feel better but still has some decrese motion shoulder     Medications that are relevant include colchicine allopurinol and Plavix  Pertinent medical history diabetes.   Improved decreased pain in the right shoulder no swelling or pain in the right wrist but some pain and swelling in his left foot  BP 138/70   Pulse 78   Ht 5\' 10"  (1.778 m)   Wt 230 lb (104.3 kg)   BMI 33.00 kg/m   Left foot has some soft tissue swelling and edema the skin has chronic pigment changes from venous stasis or longstanding edema  No ulceration  Ankle motion is normal ankle is stable no specific areas of tenderness  X-ray is negative except for osteopenia and some type of issue with the great toe which is chronic  Impression edema  Encounter Diagnoses  Name Primary?  . Pain in left foot Yes  . Peripheral edema     Compression hose

## 2019-10-31 NOTE — Patient Instructions (Signed)
Get compression hose form [employer]

## 2019-11-03 ENCOUNTER — Ambulatory Visit (HOSPITAL_COMMUNITY): Payer: Medicare HMO | Admitting: Hematology

## 2019-11-10 DIAGNOSIS — I2510 Atherosclerotic heart disease of native coronary artery without angina pectoris: Secondary | ICD-10-CM | POA: Diagnosis not present

## 2019-11-10 DIAGNOSIS — Z6833 Body mass index (BMI) 33.0-33.9, adult: Secondary | ICD-10-CM | POA: Diagnosis not present

## 2019-11-10 DIAGNOSIS — E559 Vitamin D deficiency, unspecified: Secondary | ICD-10-CM | POA: Diagnosis not present

## 2019-11-10 DIAGNOSIS — J449 Chronic obstructive pulmonary disease, unspecified: Secondary | ICD-10-CM | POA: Diagnosis not present

## 2019-11-10 DIAGNOSIS — C3491 Malignant neoplasm of unspecified part of right bronchus or lung: Secondary | ICD-10-CM | POA: Diagnosis not present

## 2019-11-10 DIAGNOSIS — I700 Atherosclerosis of aorta: Secondary | ICD-10-CM | POA: Diagnosis not present

## 2019-11-10 DIAGNOSIS — M1991 Primary osteoarthritis, unspecified site: Secondary | ICD-10-CM | POA: Diagnosis not present

## 2019-11-10 DIAGNOSIS — E538 Deficiency of other specified B group vitamins: Secondary | ICD-10-CM | POA: Diagnosis not present

## 2019-11-10 DIAGNOSIS — Z719 Counseling, unspecified: Secondary | ICD-10-CM | POA: Diagnosis not present

## 2019-11-10 DIAGNOSIS — R5383 Other fatigue: Secondary | ICD-10-CM | POA: Diagnosis not present

## 2019-11-11 ENCOUNTER — Ambulatory Visit (INDEPENDENT_AMBULATORY_CARE_PROVIDER_SITE_OTHER): Payer: Medicare HMO

## 2019-11-11 ENCOUNTER — Other Ambulatory Visit: Payer: Self-pay

## 2019-11-11 DIAGNOSIS — J4540 Moderate persistent asthma, uncomplicated: Secondary | ICD-10-CM

## 2019-11-14 ENCOUNTER — Other Ambulatory Visit: Payer: Self-pay | Admitting: *Deleted

## 2019-11-14 MED ORDER — OMALIZUMAB 150 MG ~~LOC~~ SOLR: 375.0000 mg | SUBCUTANEOUS | 11 refills | Status: DC

## 2019-11-15 ENCOUNTER — Ambulatory Visit (HOSPITAL_COMMUNITY): Payer: Medicare HMO | Attending: Cardiology

## 2019-11-15 ENCOUNTER — Other Ambulatory Visit: Payer: Self-pay

## 2019-11-15 DIAGNOSIS — R0602 Shortness of breath: Secondary | ICD-10-CM | POA: Insufficient documentation

## 2019-11-16 NOTE — Patient Instructions (Signed)
EDDIE PAYETTE  11/16/2019     @PREFPERIOPPHARMACY @   Your procedure is scheduled on  11/24/2019 .  Report to [facility] at  1145  A.M.  Call this number if you have problems the morning of surgery:  [phone]   Remember:  Follow the diet and prep instructions given to you by Dr. Roseanne Kaufman office.                      Take these medicines the morning of surgery with A SIP OF WATER  Allopurinol, amlodipine, metoprolol, zoloft. Use your inhaler and your nebulizer before you come. DO NOT take any medications for diabetes the morning of your procedure.    Do not wear jewelry, make-up or nail polish.  Do not wear lotions, powders, or perfumes. Please wear deodorant and brush your teeth.  Do not shave 48 hours prior to surgery.  Men may shave face and neck.  Do not bring valuables to the hospital.  [facility] is not responsible for any belongings or valuables.  Contacts, dentures or bridgework may not be worn into surgery.  Leave your suitcase in the car.  After surgery it may be brought to your room.  For patients admitted to the hospital, discharge time will be determined by your treatment team.  Patients discharged the day of surgery will not be allowed to drive home.   Name and phone number of your driver:   family Special instructions:  DO NOT smoke the morning of your procedure.  Please read over the following fact sheets that you were given. Anesthesia Post-op Instructions and Care and Recovery After Surgery       Colonoscopy, Adult, Care After This sheet gives you information about how to care for yourself after your procedure. Your health care provider may also give you more specific instructions. If you have problems or questions, contact your health care provider. What can I expect after the procedure? After the procedure, it is common to have:  A small amount of blood in your stool for 24 hours after the procedure.  Some gas.  Mild cramping or bloating  of your abdomen. Follow these instructions at home: Eating and drinking   Drink enough fluid to keep your urine pale yellow.  Follow instructions from your health care provider about eating or drinking restrictions.  Resume your normal diet as instructed by your health care provider. Avoid heavy or fried foods that are hard to digest. Activity  Rest as told by your health care provider.  Avoid sitting for a long time without moving. Get up to take short walks every 1-2 hours. This is important to improve blood flow and breathing. Ask for help if you feel weak or unsteady.  Return to your normal activities as told by your health care provider. Ask your health care provider what activities are safe for you. Managing cramping and bloating   Try walking around when you have cramps or feel bloated.  Apply heat to your abdomen as told by your health care provider. Use the heat source that your health care provider recommends, such as a moist heat pack or a heating pad. ? Place a towel between your skin and the heat source. ? Leave the heat on for 20-30 minutes. ? Remove the heat if your skin turns bright red. This is especially important if you are unable to feel pain, heat, or cold. You may have a greater risk of  getting burned. General instructions  For the first 24 hours after the procedure: ? Do not drive or use machinery. ? Do not sign important documents. ? Do not drink alcohol. ? Do your regular daily activities at a slower pace than normal. ? Eat soft foods that are easy to digest.  Take over-the-counter and prescription medicines only as told by your health care provider.  Keep all follow-up visits as told by your health care provider. This is important. Contact a health care provider if:  You have blood in your stool 2-3 days after the procedure. Get help right away if you have:  More than a small spotting of blood in your stool.  Large blood clots in your  stool.  Swelling of your abdomen.  Nausea or vomiting.  A fever.  Increasing pain in your abdomen that is not relieved with medicine. Summary  After the procedure, it is common to have a small amount of blood in your stool. You may also have mild cramping and bloating of your abdomen.  For the first 24 hours after the procedure, do not drive or use machinery, sign important documents, or drink alcohol.  Get help right away if you have a lot of blood in your stool, nausea or vomiting, a fever, or increased pain in your abdomen. This information is not intended to replace advice given to you by your health care provider. Make sure you discuss any questions you have with your health care provider. Document Revised: 01/03/2019 Document Reviewed: 01/03/2019 Elsevier Patient Education  [place] After These instructions provide you with information about caring for yourself after your procedure. Your health care provider may also give you more specific instructions. Your treatment has been planned according to current medical practices, but problems sometimes occur. Call your health care provider if you have any problems or questions after your procedure. What can I expect after the procedure? After your procedure, you may:  Feel sleepy for several hours.  Feel clumsy and have poor balance for several hours.  Feel forgetful about what happened after the procedure.  Have poor judgment for several hours.  Feel nauseous or vomit.  Have a sore throat if you had a breathing tube during the procedure. Follow these instructions at home: For at least 24 hours after the procedure:      Have a responsible adult stay with you. It is important to have someone help care for you until you are awake and alert.  Rest as needed.  Do not: ? Participate in activities in which you could fall or become injured. ? Drive. ? Use heavy machinery. ? Drink  alcohol. ? Take sleeping pills or medicines that cause drowsiness. ? Make important decisions or sign legal documents. ? Take care of children on your own. Eating and drinking  Follow the diet that is recommended by your health care provider.  If you vomit, drink water, juice, or soup when you can drink without vomiting.  Make sure you have little or no nausea before eating solid foods. General instructions  Take over-the-counter and prescription medicines only as told by your health care provider.  If you have sleep apnea, surgery and certain medicines can increase your risk for breathing problems. Follow instructions from your health care provider about wearing your sleep device: ? Anytime you are sleeping, including during daytime naps. ? While taking prescription pain medicines, sleeping medicines, or medicines that make you drowsy.  If you smoke, do not  smoke without supervision.  Keep all follow-up visits as told by your health care provider. This is important. Contact a health care provider if:  You keep feeling nauseous or you keep vomiting.  You feel light-headed.  You develop a rash.  You have a fever. Get help right away if:  You have trouble breathing. Summary  For several hours after your procedure, you may feel sleepy and have poor judgment.  Have a responsible adult stay with you for at least 24 hours or until you are awake and alert. This information is not intended to replace advice given to you by your health care provider. Make sure you discuss any questions you have with your health care provider. Document Revised: 09/07/2017 Document Reviewed: 09/30/2015 Elsevier Patient Education  [place].

## 2019-11-22 ENCOUNTER — Other Ambulatory Visit (HOSPITAL_COMMUNITY)
Admission: RE | Admit: 2019-11-22 | Discharge: 2019-11-22 | Disposition: A | Discharge status: Home / Self Care | Payer: Medicare HMO | Source: Ambulatory Visit | Attending: Internal Medicine | Admitting: Internal Medicine

## 2019-11-22 ENCOUNTER — Other Ambulatory Visit: Payer: Self-pay

## 2019-11-22 ENCOUNTER — Encounter (HOSPITAL_COMMUNITY)
Admission: RE | Admit: 2019-11-22 | Discharge: 2019-11-22 | Disposition: A | Discharge status: Home / Self Care | Payer: Medicare HMO | Source: Ambulatory Visit | Attending: Internal Medicine | Admitting: Internal Medicine

## 2019-11-22 DIAGNOSIS — Z20822 Contact with and (suspected) exposure to covid-19: Secondary | ICD-10-CM | POA: Insufficient documentation

## 2019-11-22 DIAGNOSIS — Z01812 Encounter for preprocedural laboratory examination: Secondary | ICD-10-CM | POA: Insufficient documentation

## 2019-11-22 LAB — CBC WITH DIFFERENTIAL/PLATELET
Abs Immature Granulocytes: 0.04 10*3/uL (ref 0.00–0.07)
Basophils Absolute: 0.1 10*3/uL (ref 0.0–0.1)
Basophils Relative: 1 %
Eosinophils Absolute: 0.3 10*3/uL (ref 0.0–0.5)
Eosinophils Relative: 4 %
HCT: 46.8 % (ref 39.0–52.0)
Hemoglobin: 15.4 g/dL (ref 13.0–17.0)
Immature Granulocytes: 1 %
Lymphocytes Relative: 20 %
Lymphs Abs: 1.8 10*3/uL (ref 0.7–4.0)
MCH: 32.2 pg (ref 26.0–34.0)
MCHC: 32.9 g/dL (ref 30.0–36.0)
MCV: 97.7 fL (ref 80.0–100.0)
Monocytes Absolute: 0.9 10*3/uL (ref 0.1–1.0)
Monocytes Relative: 10 %
Neutro Abs: 5.7 10*3/uL (ref 1.7–7.7)
Neutrophils Relative %: 64 %
Platelets: 205 10*3/uL (ref 150–400)
RBC: 4.79 MIL/uL (ref 4.22–5.81)
RDW: 14.6 % (ref 11.5–15.5)
WBC: 8.8 10*3/uL (ref 4.0–10.5)
nRBC: 0 % (ref 0.0–0.2)

## 2019-11-22 LAB — COMPREHENSIVE METABOLIC PANEL
ALT: 21 U/L (ref 0–44)
AST: 19 U/L (ref 15–41)
Albumin: 3.6 g/dL (ref 3.5–5.0)
Alkaline Phosphatase: 103 U/L (ref 38–126)
Anion gap: 11 (ref 5–15)
BUN: 14 mg/dL (ref 8–23)
CO2: 25 mmol/L (ref 22–32)
Calcium: 9.1 mg/dL (ref 8.9–10.3)
Chloride: 103 mmol/L (ref 98–111)
Creatinine, Ser: 1.02 mg/dL (ref 0.61–1.24)
GFR calc Af Amer: >60 mL/min (ref >60)
GFR calc non Af Amer: >60 mL/min (ref >60)
Glucose, Bld: 140 mg/dL — ABNORMAL HIGH (ref 70–99)
Potassium: 4.4 mmol/L (ref 3.5–5.1)
Sodium: 139 mmol/L (ref 135–145)
Total Bilirubin: 0.4 mg/dL (ref 0.3–1.2)
Total Protein: 7 g/dL (ref 6.5–8.1)

## 2019-11-22 LAB — SARS CORONAVIRUS 2 (TAT 6-24 HRS): SARS Coronavirus 2: NEGATIVE

## 2019-11-24 ENCOUNTER — Other Ambulatory Visit: Payer: Self-pay

## 2019-11-24 ENCOUNTER — Ambulatory Visit (HOSPITAL_COMMUNITY): Payer: Medicare HMO | Admitting: Anesthesiology

## 2019-11-24 ENCOUNTER — Encounter (HOSPITAL_COMMUNITY)
Admission: RE | Disposition: A | Discharge status: Home / Self Care | Payer: Self-pay | Source: Home / Self Care | Attending: Internal Medicine

## 2019-11-24 ENCOUNTER — Ambulatory Visit (HOSPITAL_COMMUNITY)
Admission: RE | Admit: 2019-11-24 | Discharge: 2019-11-24 | Disposition: A | Discharge status: Home / Self Care | Payer: Medicare HMO | Attending: Internal Medicine | Admitting: Internal Medicine

## 2019-11-24 ENCOUNTER — Encounter (HOSPITAL_COMMUNITY): Payer: Self-pay | Admitting: Internal Medicine

## 2019-11-24 DIAGNOSIS — Z6833 Body mass index (BMI) 33.0-33.9, adult: Secondary | ICD-10-CM | POA: Insufficient documentation

## 2019-11-24 DIAGNOSIS — D125 Benign neoplasm of sigmoid colon: Secondary | ICD-10-CM | POA: Insufficient documentation

## 2019-11-24 DIAGNOSIS — E119 Type 2 diabetes mellitus without complications: Secondary | ICD-10-CM | POA: Diagnosis not present

## 2019-11-24 DIAGNOSIS — K5730 Diverticulosis of large intestine without perforation or abscess without bleeding: Secondary | ICD-10-CM | POA: Diagnosis not present

## 2019-11-24 DIAGNOSIS — I509 Heart failure, unspecified: Secondary | ICD-10-CM | POA: Insufficient documentation

## 2019-11-24 DIAGNOSIS — R195 Other fecal abnormalities: Secondary | ICD-10-CM | POA: Diagnosis not present

## 2019-11-24 DIAGNOSIS — D127 Benign neoplasm of rectosigmoid junction: Secondary | ICD-10-CM | POA: Insufficient documentation

## 2019-11-24 DIAGNOSIS — I739 Peripheral vascular disease, unspecified: Secondary | ICD-10-CM | POA: Insufficient documentation

## 2019-11-24 DIAGNOSIS — Z85118 Personal history of other malignant neoplasm of bronchus and lung: Secondary | ICD-10-CM | POA: Diagnosis not present

## 2019-11-24 DIAGNOSIS — Z79899 Other long term (current) drug therapy: Secondary | ICD-10-CM | POA: Insufficient documentation

## 2019-11-24 DIAGNOSIS — Z7984 Long term (current) use of oral hypoglycemic drugs: Secondary | ICD-10-CM | POA: Insufficient documentation

## 2019-11-24 DIAGNOSIS — K635 Polyp of colon: Secondary | ICD-10-CM

## 2019-11-24 DIAGNOSIS — F329 Major depressive disorder, single episode, unspecified: Secondary | ICD-10-CM | POA: Diagnosis not present

## 2019-11-24 DIAGNOSIS — I058 Other rheumatic mitral valve diseases: Secondary | ICD-10-CM | POA: Diagnosis not present

## 2019-11-24 DIAGNOSIS — Z87442 Personal history of urinary calculi: Secondary | ICD-10-CM | POA: Insufficient documentation

## 2019-11-24 DIAGNOSIS — N400 Enlarged prostate without lower urinary tract symptoms: Secondary | ICD-10-CM | POA: Insufficient documentation

## 2019-11-24 DIAGNOSIS — M1990 Unspecified osteoarthritis, unspecified site: Secondary | ICD-10-CM | POA: Diagnosis not present

## 2019-11-24 DIAGNOSIS — M109 Gout, unspecified: Secondary | ICD-10-CM | POA: Diagnosis not present

## 2019-11-24 DIAGNOSIS — D120 Benign neoplasm of cecum: Secondary | ICD-10-CM | POA: Insufficient documentation

## 2019-11-24 DIAGNOSIS — I4510 Unspecified right bundle-branch block: Secondary | ICD-10-CM | POA: Diagnosis not present

## 2019-11-24 DIAGNOSIS — E785 Hyperlipidemia, unspecified: Secondary | ICD-10-CM | POA: Diagnosis not present

## 2019-11-24 DIAGNOSIS — E7800 Pure hypercholesterolemia, unspecified: Secondary | ICD-10-CM | POA: Diagnosis not present

## 2019-11-24 DIAGNOSIS — D124 Benign neoplasm of descending colon: Secondary | ICD-10-CM | POA: Diagnosis not present

## 2019-11-24 DIAGNOSIS — J449 Chronic obstructive pulmonary disease, unspecified: Secondary | ICD-10-CM | POA: Insufficient documentation

## 2019-11-24 DIAGNOSIS — Z7902 Long term (current) use of antithrombotics/antiplatelets: Secondary | ICD-10-CM | POA: Insufficient documentation

## 2019-11-24 DIAGNOSIS — Z7951 Long term (current) use of inhaled steroids: Secondary | ICD-10-CM | POA: Diagnosis not present

## 2019-11-24 DIAGNOSIS — I110 Hypertensive heart disease with heart failure: Secondary | ICD-10-CM | POA: Diagnosis not present

## 2019-11-24 DIAGNOSIS — F17210 Nicotine dependence, cigarettes, uncomplicated: Secondary | ICD-10-CM | POA: Diagnosis not present

## 2019-11-24 DIAGNOSIS — K621 Rectal polyp: Secondary | ICD-10-CM

## 2019-11-24 DIAGNOSIS — Z86010 Personal history of colonic polyps: Secondary | ICD-10-CM

## 2019-11-24 DIAGNOSIS — G4733 Obstructive sleep apnea (adult) (pediatric): Secondary | ICD-10-CM | POA: Diagnosis not present

## 2019-11-24 DIAGNOSIS — D123 Benign neoplasm of transverse colon: Secondary | ICD-10-CM | POA: Diagnosis not present

## 2019-11-24 DIAGNOSIS — E669 Obesity, unspecified: Secondary | ICD-10-CM | POA: Insufficient documentation

## 2019-11-24 DIAGNOSIS — D128 Benign neoplasm of rectum: Secondary | ICD-10-CM | POA: Diagnosis not present

## 2019-11-24 HISTORY — PX: POLYPECTOMY: SHX5525

## 2019-11-24 HISTORY — PX: COLONOSCOPY WITH PROPOFOL: SHX5780

## 2019-11-24 LAB — GLUCOSE, CAPILLARY
Glucose-Capillary: 127 mg/dL — ABNORMAL HIGH (ref 70–99)
Glucose-Capillary: 114 mg/dL — ABNORMAL HIGH (ref 70–99)

## 2019-11-24 SURGERY — COLONOSCOPY WITH PROPOFOL
Anesthesia: General

## 2019-11-24 MED ORDER — IPRATROPIUM-ALBUTEROL 0.5-2.5 (3) MG/3ML IN SOLN
RESPIRATORY_TRACT | Status: AC
  Filled 2019-11-24: qty 3

## 2019-11-24 MED ORDER — CHLORHEXIDINE GLUCONATE CLOTH 2 % EX PADS: 6.0000 | MEDICATED_PAD | Freq: Once | CUTANEOUS | Status: DC

## 2019-11-24 MED ORDER — LACTATED RINGERS IV SOLN
Freq: Once | INTRAVENOUS | Status: AC
  Administered 2019-11-24: 1000 mL via INTRAVENOUS

## 2019-11-24 MED ORDER — PROPOFOL 10 MG/ML IV BOLUS
INTRAVENOUS | Status: AC
  Filled 2019-11-24: qty 20

## 2019-11-24 MED ORDER — IPRATROPIUM-ALBUTEROL 0.5-2.5 (3) MG/3ML IN SOLN
3.0000 mL | Freq: Once | RESPIRATORY_TRACT | Status: AC
  Administered 2019-11-24: 3 mL via RESPIRATORY_TRACT

## 2019-11-24 MED ORDER — LACTATED RINGERS IV SOLN: INTRAVENOUS | Status: DC | PRN

## 2019-11-24 MED ORDER — IPRATROPIUM-ALBUTEROL 0.5-2.5 (3) MG/3ML IN SOLN: 3.0000 mL | RESPIRATORY_TRACT | Status: DC

## 2019-11-24 MED ORDER — PROPOFOL 10 MG/ML IV BOLUS
INTRAVENOUS | Status: DC | PRN
  Administered 2019-11-24 (×4): 20 mg via INTRAVENOUS
  Administered 2019-11-24 (×2): 40 mg via INTRAVENOUS
  Administered 2019-11-24 (×3): 20 mg via INTRAVENOUS
  Administered 2019-11-24: 40 mg via INTRAVENOUS
  Administered 2019-11-24 (×7): 20 mg via INTRAVENOUS
  Administered 2019-11-24: 30 mg via INTRAVENOUS
  Administered 2019-11-24: 20 mg via INTRAVENOUS

## 2019-11-24 MED ORDER — PROPOFOL 500 MG/50ML IV EMUL
INTRAVENOUS | Status: DC | PRN
  Administered 2019-11-24: 150 ug/kg/min via INTRAVENOUS

## 2019-11-24 NOTE — Op Note (Signed)
[facility] Patient Name: Kevin Frank Procedure Date: 11/24/2019 1:27 PM MRN: [id number] Date of Birth: December 17, 1940 Attending MD: Norvel Richards , MD CSN: [id number] Age: 79 Admit Type: Outpatient Procedure:                Colonoscopy Indications:              Positive Cologuard test but hx of polyps Providers:                Norvel Richards, MD, Jeanann Lewandowsky. Sharon Seller, RN,                            Randa Spike, Technician Referring MD:              Medicines:                Propofol per Anesthesia Complications:            No immediate complications. Estimated Blood Loss:     Estimated blood loss was minimal. Procedure:                Pre-Anesthesia Assessment:                           - Prior to the procedure, a History and Physical                            was performed, and patient medications and                            allergies were reviewed. The patient's tolerance of                            previous anesthesia was also reviewed. The risks                            and benefits of the procedure and the sedation                            options and risks were discussed with the patient.                            All questions were answered, and informed consent                            was obtained. Prior Anticoagulants: The patient has                            taken no previous anticoagulant or antiplatelet                            agents and last took Plavix (clopidogrel) on the                            day of the procedure. ASA Grade Assessment: III - A  patient with severe systemic disease. After                            reviewing the risks and benefits, the patient was                            deemed in satisfactory condition to undergo the                            procedure.                           After obtaining informed consent, the colonoscope                            was passed under direct  vision. Throughout the                            procedure, the patient's blood pressure, pulse, and                            oxygen saturations were monitored continuously. The                            CF-HQ190L ([id number]) scope was introduced through                            the anus and advanced to the the cecum, identified                            by appendiceal orifice and ileocecal valve. The                            colonoscopy was performed without difficulty. The                            patient tolerated the procedure well. The quality                            of the bowel preparation was adequate. Scope In: 1:51:43 PM Scope Out: 6:31:49 PM Scope Withdrawal Time: 1 hour 0 minutes 23 seconds  Total Procedure Duration: 1 hour 5 minutes 13 seconds  Findings:      The perianal and digital rectal examinations were normal.      17 sessile polyps were found in the rectum, mid rectum, sigmoid colon,       descending colon, splenic flexure, hepatic flexure and ileocecal valve.       The polyps were 4 to 9 mm in size. These polyps were removed with a cold       snare. Resection and retrieval were complete. Estimated blood loss was       minimal.      Two sessile polyps were found in the hepatic flexure. The polyps were 10       to 15 mm in size. These polyps were removed with a hot snare. Resection  and retrieval were complete. Estimated blood loss: none.      Scattered medium-mouthed diverticula were found in the entire colon.       Estimated blood loss was minimal. Marked peristalsis during procedure.       Also, persisting liquid stool throughout the colon made exam more       challenging. Patient had numerous polyps. Likely not all removed. Blood       loss from cold snare polypectomy. Be minimal given the fact that patient       remained on Plavix for the procedure Impression:               - (17) 4 to 9 mm polyps in the rectum, in the mid                             rectum, in the sigmoid colon, in the descending                            colon, at the splenic flexure, at the hepatic                            flexure and at the ileocecal valve, removed with a                            cold snare. Resected and retrieved.                           - Two 10 to 15 mm polyps at the hepatic flexure,                            removed with a hot snare. Resected and retrieved.                            There was significant peristalsis during the                            procedure. Pancolonic diverticulosis. Moderate Sedation:      Moderate (conscious) sedation was administered by the endoscopy nurse       and supervised by the endoscopist. The following parameters were       monitored: oxygen saturation, heart rate, blood pressure, respiratory       rate, EKG, adequacy of pulmonary ventilation, and response to care. Recommendation:           - Patient has a contact number available for                            emergencies. The signs and symptoms of potential                            delayed complications were discussed with the                            patient. Return to normal activities tomorrow.  Written discharge instructions were provided to the                            patient.                           - Resume previous diet.                           - Continue present medications.                           - Repeat colonoscopy in 6 months for surveillance.                           - Return to GI office in 6 months. Procedure Code(s):        --- Professional ---                           [phone], Colonoscopy, flexible; with removal of                            tumor(s), polyp(s), or other lesion(s) by snare                            technique Diagnosis Code(s):        --- Professional ---                           K62.1, Rectal polyp                           K63.5, Polyp of colon                            R19.5, Other fecal abnormalities                           K57.30, Diverticulosis of large intestine without                            perforation or abscess without bleeding CPT copyright 2019 American Medical Association. All rights reserved. The codes documented in this report are preliminary and upon coder review may  be revised to meet current compliance requirements. Kevin Frank. Kevin Cozort, MD Norvel Richards, MD 11/24/2019 3:27:47 PM This report has been signed electronically. Number of Addenda: 0

## 2019-11-24 NOTE — H&P (Signed)
@LOGO @   Primary Care Physician:  Sharilyn Sites, MD Primary Gastroenterologist:  Dr. Gala Romney  Pre-Procedure History & Physical: HPI:  Kevin Frank is a 79 y.o. male here for for further evaluation of a positive Cologuard.  History of multiple colonic polyps removed in 2006 without follow-up.  Path unknown.  GI evaluation delayed because of the pandemic.  Past Medical History:  Diagnosis Date  . AAA (abdominal aortic aneurysm) ([place]) 01/2008   3.2cm  . Allergy   . Arthritis   . Asthma   . BPH (benign prostatic hyperplasia)   . Chronic knee pain    bilateral  . COPD (chronic obstructive pulmonary disease) ([place])   . Diabetes mellitus    type II  . Gout   . History of kidney stones   . Hypercholesterolemia   . Hyperlipidemia   . Hypertension   . 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 Normal nuclear stress test 2011  . Obesity   . Obstructive sleep apnea on CPAP   . Presence of indwelling urinary catheter   . Shortness of breath    due to cigarette abuse  . Tobacco abuse     Past Surgical History:  Procedure Laterality Date  . ABDOMINAL AORTIC ENDOVASCULAR FENESTRATED STENT GRAFT N/A 04/09/2016   Procedure: ABDOMINAL AORTIC ENDOVASCULAR FENESTRATED STENT GRAFT with left brachial artery access using ultrasound guidance;  Surgeon: Serafina Mitchell, MD;  Location: [facility];  Service: Vascular;  Laterality: N/A;  . [place]   [place]  . COLONOSCOPY  2006   2006: multiple diminutive polyps in rectum, one pedunculated polyp at 10 cm s/p snare removal. Left-sided diverticulum, pedunculated polyps at 35 cm and splenic flexure s/p snare removal. No path available.   [name] PERIPHERAL VASCULAR CATHETERIZATION N/A 04/10/2016   Procedure: Renal Angiography;  Surgeon: Angelia Mould, MD;  Location: [facility];  Service: Cardiovascular;  Laterality: N/A;  . TRANSURETHRAL RESECTION OF PROSTATE  01/07/2011   Procedure:  TRANSURETHRAL RESECTION OF THE PROSTATE (TURP);  Surgeon: Marissa Nestle;  Location: [facility];  Service: Urology;  Laterality: N/A;    Prior to Admission medications   Medication Sig Start Date End Date Taking? Authorizing Provider  albuterol (VENTOLIN HFA) 108 (90 Base) MCG/ACT inhaler Use 4 puffs every 4-6 hours as needed for cough or wheeze 06/01/19  Yes Valentina Shaggy, MD  allopurinol (ZYLOPRIM) 300 MG tablet Take 1 tablet (300 mg total) by mouth daily. 09/07/19  Yes Corum, Rex Kras, MD  amLODipine (NORVASC) 10 MG tablet Take 1 tablet (10 mg total) by mouth daily. 09/06/19  Yes Burtis Junes, NP  atorvastatin (LIPITOR) 20 MG tablet Take 1 tablet (20 mg total) by mouth daily. 08/24/19  Yes Corum, Rex Kras, MD  clopidogrel (PLAVIX) 75 MG tablet Take 1 tablet (75 mg total) by mouth daily. 09/07/19  Yes Corum, Rex Kras, MD  Colchicine (MITIGARE) 0.6 MG CAPS Take 1 capsule by mouth in the morning and at bedtime. 10/03/19  Yes Carole Civil, MD  Fluticasone-Umeclidin-Vilant (TRELEGY ELLIPTA) 100-62.5-25 MCG/INH AEPB Inhale 1 puff into the lungs daily. 08/26/19  Yes Valentina Shaggy, MD  hydrALAZINE (APRESOLINE) 50 MG tablet TAKE (1) TABLET TWICE DAILY. 08/08/19  Yes Burtis Junes, NP  losartan (COZAAR) 100 MG tablet TAKE 1 TABLET BY MOUTH ONCE DAILY. 10/10/19  Yes Corum, Rex Kras, MD  metFORMIN (GLUCOPHAGE) 500 MG tablet Take 1,000 mg  by mouth 2 (two) times daily with a meal.  05/31/16  Yes [provider]  metoprolol succinate (TOPROL-XL) 25 MG 24 hr tablet Take 1 tablet (25 mg total) by mouth daily. 09/06/19  Yes Burtis Junes, NP  omalizumab Arvid Right) 150 MG injection Inject 375 mg into the skin every 14 (fourteen) days. 11/14/19  Yes Valentina Shaggy, MD  polyethylene glycol-electrolytes (TRILYTE) 420 g solution Take 4,000 mLs by mouth as directed. 08/25/19  Yes Mosie Angus, Cristopher Estimable, MD  sertraline (ZOLOFT) 50 MG tablet Take 1 tablet (50 mg total) by mouth daily. 08/29/19  Yes Corum,  Rex Kras, MD  torsemide (DEMADEX) 20 MG tablet Take 20 mg by mouth daily.   Yes [provider]  diclofenac Sodium (VOLTAREN) 1 % GEL Apply 4 g topically 4 (four) times daily. 07/01/19   Carole Civil, MD  glipiZIDE (GLUCOTROL XL) 5 MG 24 hr tablet Take 1 tablet (5 mg total) by mouth daily with breakfast. 06/27/19   Nida, Marella Chimes, MD  methocarbamol (ROBAXIN) 500 MG tablet Take 1 tablet (500 mg total) by mouth 3 (three) times daily. 03/15/19   Carole Civil, MD    Allergies as of 06/14/2019 - Review Complete 06/14/2019  Allergen Reaction Noted  . No known allergies  04/08/2016    Family History  Problem Relation Age of Onset  . Liver disease Father   . Lymphoma Mother   . Diabetes Mother   . Hypertension Mother   . Cancer Mother   . Mental retardation Other        sibling -- from birth   . Asthma Daughter   . Allergic rhinitis Neg Hx   . Angioedema Neg Hx   . Atopy Neg Hx   . Eczema Neg Hx   . Immunodeficiency Neg Hx   . Colon cancer Neg Hx     Social History   Socioeconomic History  . Marital status: Married    Spouse name: Not on file  . Number of children: Not on file  . Years of education: Not on file  . Highest education level: Not on file  Occupational History  . Not on file  Tobacco Use  . Smoking status: Current Every Day Smoker    Packs/day: 3.00    Years: 50.00    Pack years: 150.00    Types: Cigarettes  . Smokeless tobacco: Never Used  . Tobacco comment: 2ppd 10/30/2016 ee  Substance and Sexual Activity  . Alcohol use: Yes    Alcohol/week: 0.0 standard drinks    Comment: very little  . Drug use: No  . Sexual activity: Not on file  Other Topics Concern  . Not on file  Social History Narrative  . Not on file   Social Determinants of Health   Financial Resource Strain:   . Difficulty of Paying Living Expenses:   Food Insecurity:   . Worried About Charity fundraiser in the Last Year:   . Arboriculturist in the Last Year:    Transportation Needs:   . Film/video editor (Medical):   [name] Lack of Transportation (Non-Medical):   Physical Activity:   . Days of Exercise per Week:   . Minutes of Exercise per Session:   Stress:   . Feeling of Stress :   Social Connections:   . Frequency of Communication with Friends and Family:   . Frequency of Social Gatherings with Friends and Family:   . Attends Religious Services:   .  Active Member of Clubs or Organizations:   . Attends Archivist Meetings:   [name] Marital Status:   Intimate Partner Violence:   . Fear of Current or Ex-Partner:   . Emotionally Abused:   [name] Physically Abused:   . Sexually Abused:     Review of Systems: See HPI, otherwise negative ROS  Physical Exam: BP (!) 152/85   Pulse 85   Temp 98.5 F (36.9 C) (Oral)   Resp 20   SpO2 92%  General:   Alert,  Well-developed, well-nourished, pleasant and cooperative in NAD SNeck:  Supple; no masses or thyromegaly. No significant cervical adenopathy. Lungs:  Clear throughout to auscultation.   No wheezes, crackles, or rhonchi. No acute distress. Heart:  Regular rate and rhythm; no murmurs, clicks, rubs,  or gallops. Abdomen: Non-distended, normal bowel sounds.  Soft and nontender without appreciable mass or hepatosplenomegaly.  Pulses:  Normal pulses noted. Extremities:  Without clubbing or edema.  Impression/Plan: 79 year old gent with a history of multiple colonic polyps removed now referred for positive Cologuard.  He is here for diagnostic colonoscopy per plan. The risks, benefits, limitations, alternatives and imponderables have been reviewed with the patient. Questions have been answered. All parties are agreeable.      Notice: This dictation was prepared with Dragon dictation along with smaller phrase technology. Any transcriptional errors that result from this process are unintentional and may not be corrected upon review.

## 2019-11-24 NOTE — Discharge Instructions (Signed)
Colonoscopy Discharge Instructions  Read the instructions outlined below and refer to this sheet in the next few weeks. These discharge instructions provide you with general information on caring for yourself after you leave the hospital. Your doctor may also give you specific instructions. While your treatment has been planned according to the most current medical practices available, unavoidable complications occasionally occur. If you have any problems or questions after discharge, call Dr. Gala Romney at [phone]. ACTIVITY  You may resume your regular activity, but move at a slower pace for the next 24 hours.   Take frequent rest periods for the next 24 hours.   Walking will help get rid of the air and reduce the bloated feeling in your belly (abdomen).   No driving for 24 hours (because of the medicine (anesthesia) used during the test).    Do not sign any important legal documents or operate any machinery for 24 hours (because of the anesthesia used during the test).  NUTRITION  Drink plenty of fluids.   You may resume your normal diet as instructed by your doctor.   Begin with a light meal and progress to your normal diet. Heavy or fried foods are harder to digest and may make you feel sick to your stomach (nauseated).   Avoid alcoholic beverages for 24 hours or as instructed.  MEDICATIONS  You may resume your normal medications unless your doctor tells you otherwise.  WHAT YOU CAN EXPECT TODAY  Some feelings of bloating in the abdomen.   Passage of more gas than usual.   Spotting of blood in your stool or on the toilet paper.  IF YOU HAD POLYPS REMOVED DURING THE COLONOSCOPY:  No aspirin products for 7 days or as instructed.   No alcohol for 7 days or as instructed.   Eat a soft diet for the next 24 hours.  FINDING OUT THE RESULTS OF YOUR TEST Not all test results are available during your visit. If your test results are not back during the visit, make an appointment  with your caregiver to find out the results. Do not assume everything is normal if you have not heard from your caregiver or the medical facility. It is important for you to follow up on all of your test results.  SEEK IMMEDIATE MEDICAL ATTENTION IF:  You have more than a spotting of blood in your stool.   Your belly is swollen (abdominal distention).   You are nauseated or vomiting.   You have a temperature over 101.   You have abdominal pain or discomfort that is severe or gets worse throughout the day.   You had numerous polyps removed from your colon.  Because of a marginal prep, not all polyps were removed  He will need a repeat colonoscopy in 6 months  Your medication list is conflicting; you need to check with your prescribing doctors regarding the appropriate dose-particularly omalizumab  You will likely see some blood in your stool the remainder of today but it should taper off by tomorrow  Further recommendations to follow pending review of pathology report  Office visit with [place] in 6 months  Patient request I called [name] at [phone] and reviewed results.  Colon Polyps  Polyps are tissue growths inside the body. Polyps can grow in many places, including the large intestine (colon). A polyp may be a round bump or a mushroom-shaped growth. You could have one polyp or several. Most colon polyps are noncancerous (benign). However, some colon polyps can become cancerous  over time. Finding and removing the polyps early can help prevent this. What are the causes? The exact cause of colon polyps is not known. What increases the risk? You are more likely to develop this condition if you:  Have a family history of colon cancer or colon polyps.  Are older than 65 or older than 45 if you are African American.  Have inflammatory bowel disease, such as ulcerative colitis or Crohn's disease.  Have certain hereditary conditions, such as: ? Familial adenomatous polyposis. ? Lynch  syndrome. ? Turcot syndrome. ? Peutz-Jeghers syndrome.  Are overweight.  Smoke cigarettes.  Do not get enough exercise.  Drink too much alcohol.  Eat a diet that is high in fat and red meat and low in fiber.  Had childhood cancer that was treated with abdominal radiation. What are the signs or symptoms? Most polyps do not cause symptoms. If you have symptoms, they may include:  Blood coming from your rectum when having a bowel movement.  Blood in your stool. The stool may look dark red or black.  Abdominal pain.  A change in bowel habits, such as constipation or diarrhea. How is this diagnosed? This condition is diagnosed with a colonoscopy. This is a procedure in which a lighted, flexible scope is inserted into the anus and then passed into the colon to examine the area. Polyps are sometimes found when a colonoscopy is done as part of routine cancer screening tests. How is this treated? Treatment for this condition involves removing any polyps that are found. Most polyps can be removed during a colonoscopy. Those polyps will then be tested for cancer. Additional treatment may be needed depending on the results of testing. Follow these instructions at home: Lifestyle  Maintain a healthy weight, or lose weight if recommended by your health care provider.  Exercise every day or as told by your health care provider.  Do not use any products that contain nicotine or tobacco, such as cigarettes and e-cigarettes. If you need help quitting, ask your health care provider.  If you drink alcohol, limit how much you have: ? 0-1 drink a day for women. ? 0-2 drinks a day for men.  Be aware of how much alcohol is in your drink. In the [place] one drink equals one 12 oz bottle of beer (355 mL), one 5 oz glass of wine (148 mL), or one 1 oz shot of hard liquor (44 mL). Eating and drinking   Eat foods that are high in fiber, such as fruits, vegetables, and whole grains.  Eat foods that  are high in calcium and vitamin D, such as milk, cheese, yogurt, eggs, liver, fish, and broccoli.  Limit foods that are high in fat, such as fried foods and desserts.  Limit the amount of red meat and processed meat you eat, such as hot dogs, sausage, bacon, and lunch meats. General instructions  Keep all follow-up visits as told by your health care provider. This is important. ? This includes having regularly scheduled colonoscopies. ? Talk to your health care provider about when you need a colonoscopy. Contact a health care provider if:  You have new or worsening bleeding during a bowel movement.  You have new or increased blood in your stool.  You have a change in bowel habits.  You lose weight for no known reason. Summary  Polyps are tissue growths inside the body. Polyps can grow in many places, including the colon.  Most colon polyps are noncancerous (benign), but some  can become cancerous over time.  This condition is diagnosed with a colonoscopy.  Treatment for this condition involves removing any polyps that are found. Most polyps can be removed during a colonoscopy. This information is not intended to replace advice given to you by your health care provider. Make sure you discuss any questions you have with your health care provider. Document Revised: 09/24/2017 Document Reviewed: 09/24/2017 Elsevier Patient Education  [place].

## 2019-11-24 NOTE — Transfer of Care (Signed)
Immediate Anesthesia Transfer of Care Note  Patient: Kevin Frank  Procedure(s) Performed: COLONOSCOPY WITH PROPOFOL (N/A ) POLYPECTOMY  Patient Location: PACU  Anesthesia Type:General  Level of Consciousness: awake  Airway & Oxygen Therapy: Patient Spontanous Breathing  Post-op Assessment: Report given to RN  Post vital signs: Reviewed  Last Vitals:  Vitals Value Taken Time  BP    Temp    Pulse 81 11/24/19 1505  Resp 16 11/24/19 1505  SpO2 92 % 11/24/19 1505  Vitals shown include unvalidated device data.  Last Pain:  Vitals:   11/24/19 1347  TempSrc:   PainSc: 0-No pain      Patients Stated Pain Goal: 8 (16/10/96 0454)  Complications: No apparent anesthesia complications

## 2019-11-24 NOTE — Anesthesia Preprocedure Evaluation (Addendum)
Anesthesia Evaluation  Patient identified by MRN, date of birth, ID band Patient awake    Reviewed: Allergy & Precautions, NPO status , Patient's Chart, lab work & pertinent test results, reviewed documented beta blocker date and time   History of Anesthesia Complications Negative for: history of anesthetic complications  Airway Mallampati: III  TM Distance: >3 FB Neck ROM: Full    Dental  (+) Missing, Chipped, Dental Advisory Given   Pulmonary shortness of breath and with exertion, asthma , sleep apnea and Continuous Positive Airway Pressure Ventilation , COPD,  COPD inhaler, Current Smoker (smoked 8 to 10 cigarettes today)Patient did not abstain from smoking.,  Right lung carcinoma,    Pulmonary exam normal breath sounds clear to auscultation + decreased breath sounds      Cardiovascular METS: 3 - Mets hypertension, Pt. on medications and Pt. on home beta blockers + Peripheral Vascular Disease (AAA repair) and +CHF  Normal cardiovascular exam Rhythm:Regular Rate:Normal  EKG - Incomplete RBBB  Echo -  1. Normal LV systolic function; mild LVE; mild LVH; grade 1 diastolic  dysfunction; mildly dilated ascending aorta.  2. Left ventricular ejection fraction, by estimation, is 60 to 65%. The  left ventricle has normal function. The left ventricle has no regional  wall motion abnormalities. The left ventricular internal cavity size was  mildly dilated. There is mild left  ventricular hypertrophy. Left ventricular diastolic parameters are  consistent with Grade I diastolic dysfunction (impaired relaxation).  Elevated left atrial pressure.  3. Right ventricular systolic function is normal. The right ventricular  size is normal. There is mildly elevated pulmonary artery systolic  pressure.  4. The mitral valve is normal in structure. Trivial mitral valve  regurgitation. No evidence of mitral stenosis.  5. The aortic valve is  tricuspid. Aortic valve regurgitation is not  visualized. Mild aortic valve sclerosis is present, with no evidence of  aortic valve stenosis.  6. Aortic dilatation noted. There is mild dilatation of the ascending  aorta measuring 41 mm.  7. The inferior vena cava is normal in size with greater than 50%  respiratory variability, suggesting right atrial pressure of 3 mmHg.    Neuro/Psych PSYCHIATRIC DISORDERS Depression    GI/Hepatic negative GI ROS, Neg liver ROS,   Endo/Other  diabetes, Well Controlled, Type 2, Oral Hypoglycemic Agents  Renal/GU Renal disease     Musculoskeletal  (+) Arthritis ,   Abdominal   Peds  Hematology   Anesthesia Other Findings   Reproductive/Obstetrics                         Anesthesia Physical Anesthesia Plan  ASA: III  Anesthesia Plan: General   Post-op Pain Management:    Induction: Intravenous  PONV Risk Score and Plan: 1 and Treatment may vary due to age or medical condition and TIVA  Airway Management Planned: Nasal Cannula, Natural Airway and Simple Face Mask  Additional Equipment:   Intra-op Plan:   Post-operative Plan:   Informed Consent: I have reviewed the patients History and Physical, chart, labs and discussed the procedure including the risks, benefits and alternatives for the proposed anesthesia with the patient or authorized representative who has indicated his/her understanding and acceptance.     Dental advisory given  Plan Discussed with: CRNA and Surgeon  Anesthesia Plan Comments:         Anesthesia Quick Evaluation

## 2019-11-24 NOTE — Anesthesia Postprocedure Evaluation (Signed)
Anesthesia Post Note  Patient: Kevin Frank  Procedure(s) Performed: COLONOSCOPY WITH PROPOFOL (N/A ) POLYPECTOMY  Patient location during evaluation: PACU Anesthesia Type: General Level of consciousness: awake and alert and oriented Pain management: pain level controlled Vital Signs Assessment: post-procedure vital signs reviewed and stable Respiratory status: spontaneous breathing Cardiovascular status: blood pressure returned to baseline and stable Postop Assessment: no apparent nausea or vomiting Anesthetic complications: no     Last Vitals:  Vitals:   11/24/19 1214  BP: (!) 152/85  Pulse: 85  Resp: 20  Temp: 36.9 C  SpO2: 92%    Last Pain:  Vitals:   11/24/19 1347  TempSrc:   PainSc: 0-No pain                 Laraine Samet

## 2019-11-25 ENCOUNTER — Ambulatory Visit (INDEPENDENT_AMBULATORY_CARE_PROVIDER_SITE_OTHER): Payer: Medicare HMO

## 2019-11-25 DIAGNOSIS — J4540 Moderate persistent asthma, uncomplicated: Secondary | ICD-10-CM

## 2019-11-28 ENCOUNTER — Encounter: Payer: Self-pay | Admitting: Internal Medicine

## 2019-11-28 LAB — SURGICAL PATHOLOGY

## 2019-11-30 ENCOUNTER — Other Ambulatory Visit: Payer: Self-pay

## 2019-11-30 ENCOUNTER — Encounter: Payer: Self-pay | Admitting: Allergy & Immunology

## 2019-11-30 ENCOUNTER — Ambulatory Visit (INDEPENDENT_AMBULATORY_CARE_PROVIDER_SITE_OTHER): Payer: Medicare HMO | Admitting: Allergy & Immunology

## 2019-11-30 ENCOUNTER — Ambulatory Visit: Payer: Self-pay

## 2019-11-30 VITALS — BP 102/68 | HR 66 | Resp 20

## 2019-11-30 DIAGNOSIS — J449 Chronic obstructive pulmonary disease, unspecified: Secondary | ICD-10-CM | POA: Diagnosis not present

## 2019-11-30 DIAGNOSIS — J3089 Other allergic rhinitis: Secondary | ICD-10-CM

## 2019-11-30 DIAGNOSIS — F17200 Nicotine dependence, unspecified, uncomplicated: Secondary | ICD-10-CM

## 2019-11-30 DIAGNOSIS — J302 Other seasonal allergic rhinitis: Secondary | ICD-10-CM

## 2019-11-30 MED ORDER — TRELEGY ELLIPTA 200-62.5-25 MCG/INH IN AEPB: 1.0000 | INHALATION_SPRAY | Freq: Every day | RESPIRATORY_TRACT | 5 refills | Status: AC

## 2019-11-30 NOTE — Patient Instructions (Addendum)
1. Asthma-COPD overlap syndrome - Lung testing looked stable today. - I would stop the [facility] since we do not want to overdose on the long acting albuterol present in both of those. - I will explain my reasoning to your new Primary Care Doctor in my note.  - Daily controller medication(s): Trelegy 100/62.5/25 one puff once daily + Xolair every two weeks - Prior to physical activity: ProAir 2 puffs 10-15 minutes before physical activity.  - Rescue medications: ProAir 4 puffs every 4-6 hours as needed or albuterol nebulizer one vial every 4-6 hours as needed - Asthma control goals:  * Full participation in all desired activities (may need albuterol before activity) * Albuterol use two time or less a week on average (not counting use with activity) * Cough interfering with sleep two time or less a month * Oral steroids no more than once a year * No hospitalizations  2. Chronic rhinitis (cat, dog, cockroach, trees, weeds, and ragweed) - Continue with  antihistamine as needed (Allegra, Zyrtec, Claritin).   3. Return in about 6 months (around 05/31/2020). This can be an in-person, a virtual Webex or a telephone follow up visit.   Please inform us of any Emergency Department visits, hospitalizations, or changes in symptoms. Call us before going to the ED for breathing or allergy symptoms since we might be able to fit you in for a sick visit. Feel free to contact us anytime with any questions, problems, or concerns.  It was a pleasure to see you again today! Sorry that I am going to miss the [employer]! I promise to make the next one!   Websites that have reliable patient information: 1. American Academy of Asthma, Allergy, and Immunology: www.aaaai.org 2. Food Allergy Research and Education (FARE): foodallergy.org 3. Mothers of Asthmatics: http://www.asthmacommunitynetwork.org 4. American College of Allergy, Asthma, and Immunology: www.acaai.org   COVID-19 Vaccine Information can be  found at: ShippingScam.co.uk For questions related to vaccine distribution or appointments, please email [email or web address] or call [phone].     "Like" [place] on Facebook and Instagram for our latest updates!        Make sure you are registered to vote! If you have moved or changed any of your contact information, you will need to get this updated before voting!  In some cases, you MAY be able to register to vote online: [email or web address]

## 2019-11-30 NOTE — Progress Notes (Signed)
FOLLOW UP  Date of Service/Encounter:  11/30/19   Assessment:   Asthma-COPD overlap syndrome- improved on Xolair every 14 days  Seasonal and perennial allergic rhinitis(cat, dog, cockroach, trees, weeds, and ragweed)  Current smoker  Complex medical history, including a recently diagnosed lungcancer (detected early, treated with radiation and resection alone)   Mr. Haggar is doing fairly well on the current regimen.  I am not entirely clear on why Dulera was added to his regimen, but I feel that the addition of to long-acting bronchodilators is not a great idea and has no basis in current COPD and asthma guidelines.  We are going to increase to the higher dose Trelegy to add some more inhaled steroid to see if that provides improvement in his symptoms.  When we first added Trelegy on board, the higher dose Trelegy was not an option.  This Xolair has been life-changing for him and he has not required any steroids for his breathing in quite some time.  Unfortunately, the cigarette smoking is holding back his progress at age 79 I do not think this is something we are going to be able to change.   Plan/Recommendations:   1. Asthma-COPD overlap syndrome - Lung testing looked stable today. - I would stop the [facility] since we do not want to overdose on the long acting albuterol present in both of those. - I will explain my reasoning to your new Primary Care Doctor in my note.  - Daily controller medication(s): Trelegy 200/62.5/25 one puff once daily + Xolair every two weeks - Prior to physical activity: ProAir 2 puffs 10-15 minutes before physical activity.  - Rescue medications: ProAir 4 puffs every 4-6 hours as needed or albuterol nebulizer one vial every 4-6 hours as needed - Asthma control goals:  * Full participation in all desired activities (may need albuterol before activity) * Albuterol use two time or less a week on average (not counting use with activity) * Cough  interfering with sleep two time or less a month * Oral steroids no more than once a year * No hospitalizations  2. Chronic rhinitis (cat, dog, cockroach, trees, weeds, and ragweed) - Continue with  antihistamine as needed (Allegra, Zyrtec, Claritin).   3. Return in about 6 months (around 05/31/2020). This can be an in-person, a virtual Webex or a telephone follow up visit.   Subjective:   Kevin Frank is a 79 y.o. male presenting today for follow up of  Chief Complaint  Patient presents with  . Asthma    Kevin Frank has a history of the following: Patient Active Problem List   Diagnosis Date Noted  . Wrist pain, acute, right 09/07/2019  . Right forearm pain 09/07/2019  . Uncontrolled type 2 diabetes mellitus with hyperglycemia ([place]) 06/27/2019  . Mixed hyperlipidemia 06/27/2019  . Class 1 obesity due to excess calories with serious comorbidity and body mass index (BMI) of 32.0 to 32.9 in adult 06/27/2019  . Diabetes mellitus without complication ([place]) 36/14/4315  . Positive colorectal cancer screening using Cologuard test 06/14/2019  . Squamous cell carcinoma of right lung ([place]) 03/29/2018  . Seasonal and perennial allergic rhinitis 09/22/2017  . COPD with asthma ([place]) 09/22/2017  . Obstructive sleep apnea on CPAP 04/23/2017  . Chronic knee pain 04/23/2017  . AKI (acute kidney injury) ([place]) 04/23/2017  . Chronic diastolic CHF (congestive heart failure) ([place]) 04/23/2017  . Cellulitis 04/23/2017  . Cellulitis of left lower leg   . Current smoker 03/05/2016  .  Incidental lung nodule, > 22mm and < 54mm 03/05/2016  . Stage 2 moderate COPD by GOLD classification ([place]) 03/05/2016  . AAA (abdominal aortic aneurysm) without rupture ([place]) 06/14/2015  . OBSTRUCTIVE SLEEP APNEA 06/09/2007  . HYPERCHOLESTEROLEMIA 05/06/2007  . GOUT 05/06/2007  . DEPRESSION 05/06/2007  . Essential hypertension, benign 05/06/2007  . ABDOMINAL AORTIC ANEURYSM 05/06/2007  . BENIGN PROSTATIC  HYPERTROPHY, HX OF 05/06/2007    History obtained from: chart review and patient.  Kevin Frank is a 79 y.o. male presenting for a follow up visit. He was last seen December 2020.  At that time, we continue with Trelegy 1 puff once daily as well as Xolair every 2 weeks.  We did not do a spirometry since we are in the midst of the coronavirus pandemic.  He was actually doing very well on this regimen.  Albuterol use was minimal.  Unfortunately, he continued to smoke.  For his allergic rhinitis, we continued with antihistamines as needed.  Since last visit, he has done very well.  He actually got a new primary care provider, Dr. Hilma Favors, since his previous primary care provider retired.  Asthma/Respiratory Symptom History: He remains on the Trelegy 1 puff once daily.  He does tell me that Dr. Hilma Favors added on [facility] 2 puffs once daily to his regimen.  He has been on this for 2 weeks.  Matilde is not sure that he needs to be on both medications.  He has not noted any improvement with the addition of the [facility].  He is unsure of the reasoning for adding the [facility].  ACT score 17, indicating excellent asthma control.  He has not required any hospitalizations or ED visits.  Allergic Rhinitis Symptom History: He remains on cetirizine 10 mg once daily.  He has not required any antibiotics for sinus infections.  He denies any postnasal drip.  He does continue to smoke around 2 packs/day.   He has having another party at [place] on July 10th.   Otherwise, there have been no changes to his past medical history, surgical history, family history, or social history.    Review of Systems  Constitutional: Negative.  Negative for fever, malaise/fatigue and weight loss.  HENT: Negative.  Negative for congestion, ear discharge, ear pain, sinus pain and sore throat.   Eyes: Negative for pain, discharge and redness.  Respiratory: Negative for cough, sputum production, shortness of breath and wheezing.     Cardiovascular: Negative.  Negative for chest pain and palpitations.  Gastrointestinal: Negative for abdominal pain, constipation, diarrhea, heartburn, nausea and vomiting.  Skin: Negative.  Negative for itching and rash.  Neurological: Negative for dizziness and headaches.  Endo/Heme/Allergies: Negative for environmental allergies. Does not bruise/bleed easily.       Objective:   Blood pressure 102/68, pulse 66, resp. rate 20, SpO2 93 %. There is no height or weight on file to calculate BMI.   Physical Exam:  Physical Exam  Constitutional: He appears well-developed.  Talkative male.  Hard of hearing.  HENT:  Head: Normocephalic and atraumatic.  Right Ear: Tympanic membrane, external ear and ear canal normal. Decreased hearing is noted.  Left Ear: Tympanic membrane, external ear and ear canal normal. Decreased hearing is noted.  Nose: Mucosal edema and rhinorrhea present. No nasal deformity or septal deviation. No epistaxis. Right sinus exhibits no maxillary sinus tenderness and no frontal sinus tenderness. Left sinus exhibits no maxillary sinus tenderness and no frontal sinus tenderness.  Mouth/Throat: Uvula is midline and oropharynx is clear and moist.  Mucous membranes are not pale and not dry.  Eyes: Pupils are equal, round, and reactive to light. Conjunctivae and EOM are normal. Right eye exhibits no chemosis and no discharge. Left eye exhibits no chemosis and no discharge. Right conjunctiva is not injected. Left conjunctiva is not injected.  Cardiovascular: Normal rate, regular rhythm and normal heart sounds.  Respiratory: Effort normal and breath sounds normal. No accessory muscle usage. No tachypnea. No respiratory distress. He has no wheezes. He has no rhonchi. He has no rales. He exhibits no tenderness.  Moving air well in all lung fields.  There are some coarse upper airway sounds, but overall his lungs sound more clear than they have in some time.  Lymphadenopathy:    He  has no cervical adenopathy.  Neurological: He is alert.  Skin: No abrasion, no petechiae and no rash noted. Rash is not papular, not vesicular and not urticarial. No erythema. No pallor.  No eczematous or urticarial lesions noted.  Psychiatric: He has a normal mood and affect.     Diagnostic studies: none  (spirometry was not working today)      Salvatore Marvel, MD  All[facility] and [facility] [place]

## 2019-12-09 ENCOUNTER — Ambulatory Visit (INDEPENDENT_AMBULATORY_CARE_PROVIDER_SITE_OTHER): Payer: Medicare HMO

## 2019-12-09 ENCOUNTER — Other Ambulatory Visit: Payer: Self-pay

## 2019-12-09 DIAGNOSIS — J4540 Moderate persistent asthma, uncomplicated: Secondary | ICD-10-CM

## 2019-12-15 DIAGNOSIS — E669 Obesity, unspecified: Secondary | ICD-10-CM | POA: Diagnosis not present

## 2019-12-15 DIAGNOSIS — Z6832 Body mass index (BMI) 32.0-32.9, adult: Secondary | ICD-10-CM | POA: Diagnosis not present

## 2019-12-15 DIAGNOSIS — R5383 Other fatigue: Secondary | ICD-10-CM | POA: Diagnosis not present

## 2019-12-15 DIAGNOSIS — M109 Gout, unspecified: Secondary | ICD-10-CM | POA: Diagnosis not present

## 2019-12-19 DIAGNOSIS — R5383 Other fatigue: Secondary | ICD-10-CM | POA: Diagnosis not present

## 2019-12-19 DIAGNOSIS — R400 Somnolence: Secondary | ICD-10-CM | POA: Diagnosis not present

## 2019-12-19 DIAGNOSIS — Z6832 Body mass index (BMI) 32.0-32.9, adult: Secondary | ICD-10-CM | POA: Diagnosis not present

## 2019-12-19 DIAGNOSIS — M109 Gout, unspecified: Secondary | ICD-10-CM | POA: Diagnosis not present

## 2019-12-19 DIAGNOSIS — E538 Deficiency of other specified B group vitamins: Secondary | ICD-10-CM | POA: Diagnosis not present

## 2019-12-19 DIAGNOSIS — E7849 Other hyperlipidemia: Secondary | ICD-10-CM | POA: Diagnosis not present

## 2019-12-19 DIAGNOSIS — Z719 Counseling, unspecified: Secondary | ICD-10-CM | POA: Diagnosis not present

## 2019-12-19 DIAGNOSIS — J449 Chronic obstructive pulmonary disease, unspecified: Secondary | ICD-10-CM | POA: Diagnosis not present

## 2019-12-19 DIAGNOSIS — C3491 Malignant neoplasm of unspecified part of right bronchus or lung: Secondary | ICD-10-CM | POA: Diagnosis not present

## 2019-12-19 DIAGNOSIS — Z79899 Other long term (current) drug therapy: Secondary | ICD-10-CM | POA: Diagnosis not present

## 2019-12-23 ENCOUNTER — Ambulatory Visit
Admission: EM | Admit: 2019-12-23 | Discharge: 2019-12-23 | Disposition: A | Discharge status: Home / Self Care | Payer: Medicare HMO

## 2019-12-23 ENCOUNTER — Ambulatory Visit: Payer: Medicare HMO | Admitting: Orthopedic Surgery

## 2019-12-23 ENCOUNTER — Other Ambulatory Visit: Payer: Self-pay

## 2019-12-23 NOTE — ED Notes (Signed)
Provider states we dont do mole removal at urgent care, pt informed and directed to his pcp.

## 2019-12-23 NOTE — ED Triage Notes (Signed)
Mole on back of neck for years. Pt would like it removed.

## 2019-12-27 DIAGNOSIS — L570 Actinic keratosis: Secondary | ICD-10-CM | POA: Diagnosis not present

## 2019-12-27 DIAGNOSIS — Z125 Encounter for screening for malignant neoplasm of prostate: Secondary | ICD-10-CM | POA: Diagnosis not present

## 2019-12-27 DIAGNOSIS — C4442 Squamous cell carcinoma of skin of scalp and neck: Secondary | ICD-10-CM | POA: Diagnosis not present

## 2019-12-28 ENCOUNTER — Other Ambulatory Visit: Payer: Self-pay

## 2019-12-28 ENCOUNTER — Ambulatory Visit (INDEPENDENT_AMBULATORY_CARE_PROVIDER_SITE_OTHER): Payer: Medicare HMO

## 2019-12-28 DIAGNOSIS — J4540 Moderate persistent asthma, uncomplicated: Secondary | ICD-10-CM

## 2020-01-09 ENCOUNTER — Telehealth: Payer: Self-pay | Admitting: *Deleted

## 2020-01-09 NOTE — Telephone Encounter (Signed)
   [facility] Pre-operative Risk Assessment    HEARTCARE STAFF: - Please ensure there is not already an duplicate clearance open for this procedure. - Under Visit Info/Reason for Call, type in Other and utilize the format Clearance MM/DD/YY or Clearance TBD. Do not use dashes or single digits. - If request is for dental extraction, please clarify the # of teeth to be extracted.  Request for surgical clearance:  1. What type of surgery is being performed? 2 TEETH TO BE EXTRACTED WITH BONE GRAFTING: ( IN 3 MONTHS DENTAL OFFICE WILL FAX OVER NEW CLEARANCE WHEN THEY ARE READY TO PROCEED WITH THE 2 DENTAL IMPLANTS)  2. When is this surgery scheduled? TBD   3. What type of clearance is required (medical clearance vs. Pharmacy clearance to hold med vs. Both)? MEDICAL  4. Are there any medications that need to be held prior to surgery and how long? PLAVIX    5. Practice name and name of physician performing surgery? [facility]; DR. Harrell Gave Rolfe   6. What is the office phone number? [phone]   7.   What is the office fax number? [phone]  8.   Anesthesia type (None, local, MAC, general) ? LOCAL   Julaine Hua 01/09/2020, 11:01 AM  _________________________________________________________________   (provider comments below)

## 2020-01-09 NOTE — Telephone Encounter (Signed)
   Primary Cardiologist: Dr Burt Knack  Chart reviewed and patient contacted today as part of pre-operative protocol coverage. Given past medical history and time since last visit, based on ACC/AHA guidelines, Kevin Frank would be at acceptable risk for the planned procedure without further cardiovascular testing.   OK to hold Plavix 5-7 days pre op, resume as soon as possible postop.   I will route this recommendation to the requesting party via Epic fax function and remove from pre-op pool.  Please call with questions.  Kerin Ransom, PA-C 01/09/2020, 4:45 PM

## 2020-01-09 NOTE — Telephone Encounter (Signed)
Dr Burt Knack can you comment on the request to hold Plavix in this patient before extensive dental surgery.  He apparently has CAD but no h/o PCI.  After your recommendation we'll contact the patient for clearance.  Thanks  Kerin Ransom PA-C 01/09/2020 1:28 PM

## 2020-01-09 NOTE — Telephone Encounter (Signed)
This is fine. Ok to hold plavix x 5 days before extensive extraction if necessary. thanks

## 2020-01-11 ENCOUNTER — Other Ambulatory Visit: Payer: Self-pay

## 2020-01-11 ENCOUNTER — Ambulatory Visit (INDEPENDENT_AMBULATORY_CARE_PROVIDER_SITE_OTHER): Payer: Medicare HMO

## 2020-01-11 DIAGNOSIS — J4540 Moderate persistent asthma, uncomplicated: Secondary | ICD-10-CM

## 2020-01-18 DIAGNOSIS — J449 Chronic obstructive pulmonary disease, unspecified: Secondary | ICD-10-CM | POA: Diagnosis not present

## 2020-01-18 DIAGNOSIS — C3491 Malignant neoplasm of unspecified part of right bronchus or lung: Secondary | ICD-10-CM | POA: Diagnosis not present

## 2020-01-18 DIAGNOSIS — I700 Atherosclerosis of aorta: Secondary | ICD-10-CM | POA: Diagnosis not present

## 2020-01-18 DIAGNOSIS — R5383 Other fatigue: Secondary | ICD-10-CM | POA: Diagnosis not present

## 2020-01-19 DIAGNOSIS — C4442 Squamous cell carcinoma of skin of scalp and neck: Secondary | ICD-10-CM | POA: Diagnosis not present

## 2020-01-23 ENCOUNTER — Telehealth: Payer: Self-pay | Admitting: Orthopedic Surgery

## 2020-01-23 ENCOUNTER — Other Ambulatory Visit: Payer: Self-pay

## 2020-01-23 MED ORDER — HYDRALAZINE HCL 50 MG PO TABS: ORAL_TABLET | ORAL | 2 refills | Status: DC

## 2020-01-23 NOTE — Telephone Encounter (Signed)
Patient came to office this morning amidst morning clinic patients, asking if Dr Aline Brochure is here. I relayed to patient, yes, Dr is here and in clinic with another patient. He asked about being seen for a shoulder problem and possibly an injection. Per chart notes, it appeared that it was a new problem for other shoulder for which patient has not been seen.  I then discussed with patient that we do need to schedule an appointment per protocol.  Relayed Dr Aline Brochure is on call and therefore will be in clinic for just a while longer. I scheduled him for the next available slot.  Patient seemed fine with the information; aware we can keep his name on wait list also.* *This afternoon, another front office staff member received a call from patient's wife, asking for myself, and relayed to the other staff person that Dr Aline Brochure said patient can come in any time for an injection. Please advise regarding our protocol/policy regarding walk-ins, and if something has changed.

## 2020-01-23 NOTE — Telephone Encounter (Signed)
New problem still needs the longer appt time.  If Kevin Frank is in severe pain and needs to be seen sooner we can see what we can work out.

## 2020-01-24 ENCOUNTER — Other Ambulatory Visit: Payer: Self-pay

## 2020-01-24 ENCOUNTER — Other Ambulatory Visit: Payer: Self-pay | Admitting: Orthopedic Surgery

## 2020-01-24 ENCOUNTER — Ambulatory Visit (INDEPENDENT_AMBULATORY_CARE_PROVIDER_SITE_OTHER): Payer: Medicare HMO | Admitting: Orthopedic Surgery

## 2020-01-24 ENCOUNTER — Ambulatory Visit: Payer: Medicare HMO

## 2020-01-24 VITALS — BP 136/76 | HR 82 | Ht 70.0 in | Wt 230.0 lb

## 2020-01-24 DIAGNOSIS — G8929 Other chronic pain: Secondary | ICD-10-CM

## 2020-01-24 DIAGNOSIS — M25512 Pain in left shoulder: Secondary | ICD-10-CM

## 2020-01-24 DIAGNOSIS — M7532 Calcific tendinitis of left shoulder: Secondary | ICD-10-CM | POA: Diagnosis not present

## 2020-01-24 NOTE — Telephone Encounter (Signed)
Called patient to offer per Dr Aline Brochure. Reached; aware of this afternoon's work in appointment.

## 2020-01-24 NOTE — Progress Notes (Signed)
Chief Complaint  Patient presents with  . Shoulder Pain    Left shoulder pain    79 year old male history of gout acute onset left shoulder pain no trauma painful range of motion decreased range of motion not getting better  Review of systems no swelling erythema or fever  Past Medical History:  Diagnosis Date  . AAA (abdominal aortic aneurysm) ([place]) 01/2008   3.2cm  . Allergy   . Arthritis   . Asthma   . BPH (benign prostatic hyperplasia)   . Chronic knee pain    bilateral  . COPD (chronic obstructive pulmonary disease) ([place])   . Diabetes mellitus    type II  . Gout   . History of kidney stones   . Hypercholesterolemia   . Hyperlipidemia   . Hypertension   . Lung cancer ([place]) 2019   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   . Normal nuclear stress test 2011  . Obesity   . Obstructive sleep apnea on CPAP   . Presence of indwelling urinary catheter   . Shortness of breath    due to cigarette abuse  . Tobacco abuse     BP 136/76   Pulse 82   Ht 5\' 10"  (1.778 m)   Wt 230 lb (104.3 kg)   BMI 33.00 kg/m   Normal development grooming and hygiene awake alert and oriented x3 mood affect normal  Pain tenderness anterior lateral left shoulder painful range of motion internal and external rotation with no evidence of contracture painful forward elevation weakness and elevation and painful abduction  Neurovascular exam otherwise intact  X-ray calcific tendinitis  Injection left shoulder  Encounter Diagnoses  Name Primary?  . Chronic left shoulder pain   . Calcific tendonitis of left shoulder Yes    Procedure note the subacromial injection shoulder left   Verbal consent was obtained to inject the  Left   Shoulder  Timeout was completed to confirm the injection site is a subacromial space of the  left  shoulder  Medication used Depo-Medrol 40 mg and lidocaine 1% 3 cc  Anesthesia was provided by ethyl chloride  The injection was performed in  the left  posterior subacromial space. After pinning the skin with alcohol and anesthetized the skin with ethyl chloride the subacromial space was injected using a 20-gauge needle. There were no complications  Sterile dressing was applied.  Follow-up 2 weeks  Acute uncomplicated, injection, internal x-ray

## 2020-01-24 NOTE — Telephone Encounter (Signed)
See if he can come in this afternoon for injection

## 2020-01-24 NOTE — Patient Instructions (Addendum)
Rest for 2 weeks    Calcific Tendinitis Calcific tendinitis occurs when crystals of calcium are deposited in a tendon. Tendons are tough, cord-like tissues that connect muscle to bone. Tendons are an important part of joints. They make joints move, and they absorb some of the stress that a joint receives during use. When calcium is deposited in the tendon, the tendon becomes stiff and painful and it can become swollen. Calcific tendinitis occurs frequently in a tendon in the shoulder joint (rotator cuff). What are the causes? The cause of calcific tendinitis is not known. It may be associated with:  Overusing a tendon, such as from repetitive motion.  Excess stress on the tendon.  Age-related wear and tear.  Repetitive, mild injuries. What increases the risk? This condition is more likely to develop in:  People who do activities that involve repetitive motions.  Older people. What are the signs or symptoms? This condition may or may not be painful. If there is pain, it may occur when moving the joint. Other symptoms may include:  Tenderness when pressure is applied to the tendon.  A snapping or popping sound when the joint moves.  Decreased motion of the joint.  Difficulty sleeping due to pain in the joint. How is this diagnosed? This condition is diagnosed with a physical exam. You may also have tests, such as:  X-rays.  MRI.  CT scan. How is this treated? This condition generally gets better on its own. Treatment may also include:  Resting, icing, applying pressure (compression), and raising (elevating) the area above the level of your heart. This is known as RICE therapy.  Medicines to help reduce inflammation or to help reduce pain.  Physical therapy to strengthen and stretch the tendon.  Following a specific exercise program to keep the joint working properly. Treatment for more severe calcific tendinitis may require:  Injecting steroids or pain-relieving  medicines into or around the joint.  Manipulating the joint after you are given medicine to numb the area (local anesthetic).  Inflating the joint with sterile fluid to increase the flexibility of the tendons.  Shock wave therapy, which involves focusing sound waves on the joint. If other treatments do not work, surgery may be done to clean out the calcium deposits and repair the tendon as needed. Most people do not need surgery. Follow these instructions at home: Managing pain, stiffness, and swelling      If directed, put heat on the affected area before you exercise or as often as told by your health care provider. Use the heat source that your health care provider recommends, such as a moist heat pack or a heating pad. ? Place a towel between your skin and the heat source. ? Leave the heat on for 20-30 minutes. ? Remove the heat if your skin turns bright red. This is especially important if you are unable to feel pain, heat, or cold. You may have a greater risk of getting burned.  Move the fingers or toes of the affected limb often, if this applies. This can help to prevent stiffness and lessen swelling.  If directed, elevate the affected area above the level of your heart while you are sitting or lying down.  If directed, put ice on the affected area: ? Put ice in a plastic bag. ? Place a towel between your skin and the bag. ? Leave the ice on for 20 minutes, 2-3 times a day. General instructions  Take over-the-counter and prescription medicines only as told  by your health care provider.  Do not drive or use heavy machinery while taking prescription pain medicine.  Follow recommendations from your health care provider for activity and exercise. Ask your health care provider what activities are safe for you.  Avoid using the affected area while you are experiencing symptoms of tendinitis.  Wear an elastic bandage or compression wrap only as told by your health care  provider.  Keep all follow-up visits as told by your health care provider. This is important. Contact a health care provider if:  You have pain or numbness that gets worse.  You develop new weakness.  You notice increased joint stiffness or a sensation of looseness in the joint.  You notice increasing redness, swelling, or warmth around the joint area. Get help right away if:  You have a fever for more than 2-3 days.  You have symptoms for more than 2-3 days.  You have a fever and your symptoms suddenly get worse. This information is not intended to replace advice given to you by your health care provider. Make sure you discuss any questions you have with your health care provider. Document Revised: 09/29/2018 Document Reviewed: 03/03/2016 Elsevier Patient Education  [place].

## 2020-01-25 ENCOUNTER — Ambulatory Visit (INDEPENDENT_AMBULATORY_CARE_PROVIDER_SITE_OTHER): Payer: Medicare HMO

## 2020-01-25 DIAGNOSIS — J4540 Moderate persistent asthma, uncomplicated: Secondary | ICD-10-CM | POA: Diagnosis not present

## 2020-01-30 DIAGNOSIS — H0288B Meibomian gland dysfunction left eye, upper and lower eyelids: Secondary | ICD-10-CM | POA: Diagnosis not present

## 2020-02-06 NOTE — Progress Notes (Signed)
CARDIOLOGY OFFICE NOTE  Date:  02/13/2020    Kevin Frank Date of Birth: [birth year] Medical Record #[id number]  PCP:  Sharilyn Sites, MD  Cardiologist:  Jerel Shepherd   Chief Complaint  Patient presents with  . Follow-up    Seen for Dr. Burt Knack    History of Present Illness: Kevin Frank is a 79 y.o. male who presents today for a follow up visit.  Seen forDr. Burt Knack. He is a former patient of Dr. Susa Simmonds.Primarily follows with me.  He has a history of hypertension, hyperlipidemia, DM,ongoingtobacco abuse, COPD, and abdominal aortic aneurysm.On 04/09/2016 he underwent FEVARwith a single rightrenal fenestration by Dr. Trula Slade. He also had left renal angioplasty at the time of the main body deployment service to ensure that the graft not cover the renal artery because of angulation. The day following his procedure, his creatinine increased and ultrasound did not show good perfusion to the left kidney and therefore he went to the Cath Lab and had stenting of his left renal artery. His creatinine trended down and his urine output improved. The patient also had issues with left leg numbness in his legs after the procedure. Neurology was consulted. It was felt this was most likely secondary to lower back disease that was aggravated during surgery.  He continues to smoke cigarettes. He has not had any known CADsymptoms but has had prior CT scan showing left main and 3VD.He has been on chronic Plavix.He has had lung nodules noted as well.He had progressive shortness of breath in 2019 - found to have lung cancer - has had XRT - felt to be poor candidate for surgical intervention- saw EBG.Hehascontinued to smoke. Echo was updated and showed worsening pulmonary HTN.  Last seen in April - ok from our standpoint. PCP later on wanted Myoview and echo updated due to his breathing which I have felt is related to his cancer/ongoing tobacco abuse/COPD. These studies  were again stable. He is not interested in stopping smoking.   Comes in today. Here with his wife. His medicines are unclear according to our list. BP is ok here today. He is more limited by his generalized OA issues. He has lost weight - he was not really aware of this. For repeat CT later in October. He has had a bone graft in his mouth - was not to be smoking - but he is. He has chronic cough. They have both been vaccinated for COVID 19 fortunately.   Past Medical History:  Diagnosis Date  . AAA (abdominal aortic aneurysm) ([facility]) 01/2008   3.2cm  . Allergy   . Arthritis   . Asthma   . BPH (benign prostatic hyperplasia)   . Chronic knee pain    bilateral  . COPD (chronic obstructive pulmonary disease) ([facility])   . Diabetes mellitus    type II  . Gout   . History of kidney stones   . Hypercholesterolemia   . Hyperlipidemia   . Hypertension   . 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 Normal nuclear stress test 2011  . Obesity   . Obstructive sleep apnea on CPAP   . Presence of indwelling urinary catheter   . Shortness of breath    due to cigarette abuse  . Tobacco abuse     Past Surgical History:  Procedure Laterality Date  . ABDOMINAL AORTIC ENDOVASCULAR FENESTRATED STENT GRAFT N/A 04/09/2016   Procedure: ABDOMINAL  AORTIC ENDOVASCULAR FENESTRATED STENT GRAFT with left brachial artery access using ultrasound guidance;  Surgeon: Serafina Mitchell, MD;  Location: [facility];  Service: Vascular;  Laterality: N/A;  . [place]   [place]  . COLONOSCOPY  2006   2006: multiple diminutive polyps in rectum, one pedunculated polyp at 10 cm s/p snare removal. Left-sided diverticulum, pedunculated polyps at 35 cm and splenic flexure s/p snare removal. No path available.   . COLONOSCOPY WITH PROPOFOL N/A 11/24/2019   Procedure: COLONOSCOPY WITH PROPOFOL;  Surgeon: Daneil Dolin, MD;  [facility];  Service: Endoscopy;  Laterality:  N/A;  9:45am  . PERIPHERAL VASCULAR CATHETERIZATION N/A 04/10/2016   Procedure: Renal Angiography;  Surgeon: Angelia Mould, MD;  Location: [facility];  Service: Cardiovascular;  Laterality: N/A;  . POLYPECTOMY  11/24/2019   Procedure: POLYPECTOMY;  Surgeon: Daneil Dolin, MD;  [facility];  Service: Endoscopy;;  . TRANSURETHRAL RESECTION OF PROSTATE  01/07/2011   Procedure: TRANSURETHRAL RESECTION OF THE PROSTATE (TURP);  Surgeon: Marissa Nestle;  Location: [facility];  Service: Urology;  Laterality: N/A;     Medications: Current Meds  Medication Sig  . albuterol (VENTOLIN HFA) 108 (90 Base) MCG/ACT inhaler Use 4 puffs every 4-6 hours as needed for cough or wheeze  . allopurinol (ZYLOPRIM) 300 MG tablet Take 1 tablet (300 mg total) by mouth daily.  [name] amLODipine (NORVASC) 10 MG tablet Take 1 tablet (10 mg total) by mouth daily.  [name] atorvastatin (LIPITOR) 20 MG tablet Take 1 tablet (20 mg total) by mouth daily.  . clopidogrel (PLAVIX) 75 MG tablet Take 1 tablet (75 mg total) by mouth daily.  . Colchicine (MITIGARE) 0.6 MG CAPS Take 1 capsule by mouth in the morning and at bedtime.  . diclofenac Sodium (VOLTAREN) 1 % GEL Apply 4 g topically 4 (four) times daily.  [name] glipiZIDE (GLUCOTROL XL) 5 MG 24 hr tablet Take 1 tablet (5 mg total) by mouth daily with breakfast.  . hydrALAZINE (APRESOLINE) 50 MG tablet TAKE (1) TABLET TWICE DAILY.  [name] losartan (COZAAR) 100 MG tablet TAKE 1 TABLET BY MOUTH ONCE DAILY.  . metFORMIN (GLUCOPHAGE) 500 MG tablet Take 1,000 mg by mouth 2 (two) times daily with a meal.   . methocarbamol (ROBAXIN) 500 MG tablet Take 1 tablet (500 mg total) by mouth 3 (three) times daily.  . metoprolol succinate (TOPROL-XL) 25 MG 24 hr tablet Take 1 tablet (25 mg total) by mouth daily.  [name] omalizumab (XOLAIR) 150 MG injection Inject 375 mg into the skin every 14 (fourteen) days.  . polyethylene glycol-electrolytes (TRILYTE) 420 g solution Take 4,000 mLs by  mouth as directed.  . sertraline (ZOLOFT) 50 MG tablet Take 1 tablet (50 mg total) by mouth daily.  [name] torsemide (DEMADEX) 20 MG tablet Take 20 mg by mouth daily.   Current Facility-Administered Medications for the 02/13/20 encounter (Office Visit) with Burtis Junes, NP  Medication  . ipratropium (ATROVENT) nebulizer solution 0.5 mg  . levalbuterol (XOPENEX) nebulizer solution 1.25 mg  . omalizumab Arvid Right) injection 375 mg     Allergies: Allergies  Allergen Reactions  . No Known Allergies     Social History: The patient  reports that he has been smoking cigarettes. He has a 150.00 pack-year smoking history. He has never used smokeless tobacco. He reports current alcohol use. He reports that he does not use drugs.   Family History: The patient's family history includes Asthma in his  daughter; Cancer in his mother; Diabetes in his mother; Hypertension in his mother; Liver disease in his father; Lymphoma in his mother; Mental retardation in an other family member.   Review of Systems: Please see the history of present illness.   All other systems are reviewed and negative.   Physical Exam: VS:  BP 118/64   Pulse 80   Ht 5\' 10"  (1.778 m)   Wt 223 lb (101.2 kg)   SpO2 95%   BMI 32.00 kg/m  .  BMI Body mass index is 32 kg/m.  Wt Readings from Last 3 Encounters:  02/13/20 223 lb (101.2 kg)  01/24/20 230 lb (104.3 kg)  11/22/19 230 lb (104.3 kg)    General: Alert and in no acute distress.  He was 235# in April. He is down 12 pounds since our last visit from April.  Cardiac: Regular rate and rhythm. Heart tones are distant. No edema.  Respiratory:  Lungs are quite coarse - scattered ronchi - but with normal work of breathing.  GI: Soft and nontender.  MS: No deformity or atrophy. Gait and ROM intact.  Skin: Warm and dry. Color is normal.  Neuro:  Strength and sensation are intact and no gross focal deficits noted.  Psych: Alert, appropriate and with normal  affect.   LABORATORY DATA:  EKG:  EKG is not ordered today.    Lab Results  Component Value Date   WBC 8.8 11/22/2019   HGB 15.4 11/22/2019   HCT 46.8 11/22/2019   PLT 205 11/22/2019   GLUCOSE 140 (H) 11/22/2019   CHOL 117 03/23/2019   TRIG 89 03/23/2019   HDL 39 03/23/2019   LDLCALC 61 03/23/2019   ALT 21 11/22/2019   AST 19 11/22/2019   NA 139 11/22/2019   K 4.4 11/22/2019   CL 103 11/22/2019   CREATININE 1.02 11/22/2019   BUN 14 11/22/2019   CO2 25 11/22/2019   TSH 1.15 03/23/2019   PSA 2.0 10/09/2016   INR 0.96 03/19/2018   HGBA1C 8.1 (A) 06/27/2019     BNP (last 3 results) No results for input(s): BNP in the last 8760 hours.  ProBNP (last 3 results) No results for input(s): PROBNP in the last 8760 hours.   Other Studies Reviewed Today:  ECHO IMPRESSIONS 10/2019  1. Normal LV systolic function; mild LVE; mild LVH; grade 1 diastolic  dysfunction; mildly dilated ascending aorta.  2. Left ventricular ejection fraction, by estimation, is 60 to 65%. The  left ventricle has normal function. The left ventricle has no regional  wall motion abnormalities. The left ventricular internal cavity size was  mildly dilated. There is mild left  ventricular hypertrophy. Left ventricular diastolic parameters are  consistent with Grade I diastolic dysfunction (impaired relaxation).  Elevated left atrial pressure.  3. Right ventricular systolic function is normal. The right ventricular  size is normal. There is mildly elevated pulmonary artery systolic  pressure.  4. The mitral valve is normal in structure. Trivial mitral valve  regurgitation. No evidence of mitral stenosis.  5. The aortic valve is tricuspid. Aortic valve regurgitation is not  visualized. Mild aortic valve sclerosis is present, with no evidence of  aortic valve stenosis.  6. Aortic dilatation noted. There is mild dilatation of the ascending  aorta measuring 41 mm.  7. The inferior vena cava is  normal in size with greater than 50%  respiratory variability, suggesting right atrial pressure of 3 mmHg.   Myoview Study Highlights 10/2019   Nuclear stress  EF: 60%. The left ventricular ejection fraction is normal (55-65%).  This is a low risk study. There is no evidence of ischemia and no evidence of previous myocardial infarction.  The study is normal.  CT CHEST 04/2019 IMPRESSION: 1. Stable appearance of posterior right upper lobe lung lesion. 2. No new or progressive disease identified. 3. Emphysema and aortic atherosclerosis. Multi vessel coronary artery calcifications noted.  Aortic Atherosclerosis (ICD10-I70.0) and Emphysema (ICD10-J43.9).   Electronically Signed By: Kerby Moors M.D. On: 04/26/2019 11:27     ASSESSMENT &PLAN:    1. HTN - BP is at goal on his current regimen - we have clarified his medicines with his pharmacy and his list from our office is correct - we will continue with his regimen.   2. Long standing tobacco abuse - no intention of stopping - he has a pack of cigarettes in his pocket today.   3. Squamous cell lung cancer - for repeat scans in October.   4. HLD - on statin therapy.  5. Diastolic dysfunction - recent cardiac studies noted - BP is controlled. He loves salt - I do not see this changing but fortunately seems ok at this time.   6. Prior AAA/FEVAR with stenting of the left renal - followed by [facility]. Not discussed today.   7. Weight loss - not intentional - have asked to follow with PCP about this.   8. Chronic OA issues. Would favor conservative approach given his multiple issues - poor surgical candidate.    Current medicines are reviewed with the patient today.  The patient does not have concerns regarding medicines other than what has been noted above.  The following changes have been made:  See above.  Labs/ tests ordered today include:   No orders of the defined types were placed in this  encounter.    Disposition:   FU with Dr. Burt Knack in 6 months. Recall placed.    Patient is agreeable to this plan and will call if any problems develop in the interim.   SignedTruitt Merle, NP  02/13/2020 10:19 AM  [place] [address] [place], [place]  [address] Phone: [phone] Fax: [phone]

## 2020-02-08 ENCOUNTER — Other Ambulatory Visit: Payer: Self-pay

## 2020-02-08 ENCOUNTER — Ambulatory Visit (INDEPENDENT_AMBULATORY_CARE_PROVIDER_SITE_OTHER): Payer: Medicare HMO

## 2020-02-08 DIAGNOSIS — C44311 Basal cell carcinoma of skin of nose: Secondary | ICD-10-CM | POA: Diagnosis not present

## 2020-02-08 DIAGNOSIS — J4540 Moderate persistent asthma, uncomplicated: Secondary | ICD-10-CM

## 2020-02-09 ENCOUNTER — Ambulatory Visit: Payer: Medicare HMO | Admitting: Orthopedic Surgery

## 2020-02-09 DIAGNOSIS — K006 Disturbances in tooth eruption: Secondary | ICD-10-CM | POA: Diagnosis not present

## 2020-02-13 ENCOUNTER — Other Ambulatory Visit: Payer: Self-pay

## 2020-02-13 ENCOUNTER — Encounter: Payer: Self-pay | Admitting: Nurse Practitioner

## 2020-02-13 ENCOUNTER — Ambulatory Visit: Payer: Medicare HMO | Admitting: Nurse Practitioner

## 2020-02-13 VITALS — BP 118/64 | HR 80 | Ht 70.0 in | Wt 223.0 lb

## 2020-02-13 DIAGNOSIS — E7800 Pure hypercholesterolemia, unspecified: Secondary | ICD-10-CM | POA: Diagnosis not present

## 2020-02-13 DIAGNOSIS — R0602 Shortness of breath: Secondary | ICD-10-CM | POA: Diagnosis not present

## 2020-02-13 DIAGNOSIS — F17200 Nicotine dependence, unspecified, uncomplicated: Secondary | ICD-10-CM | POA: Diagnosis not present

## 2020-02-13 DIAGNOSIS — I10 Essential (primary) hypertension: Secondary | ICD-10-CM

## 2020-02-13 DIAGNOSIS — I5032 Chronic diastolic (congestive) heart failure: Secondary | ICD-10-CM

## 2020-02-13 DIAGNOSIS — C3490 Malignant neoplasm of unspecified part of unspecified bronchus or lung: Secondary | ICD-10-CM | POA: Diagnosis not present

## 2020-02-13 NOTE — Patient Instructions (Addendum)
After Visit Summary:  We will be checking the following labs today - NONE   Medication Instructions:    Continue with your current medicines.   Go by this list of medicines - this is correct.    If you need a refill on your cardiac medications before your next appointment, please call your pharmacy.     Testing/Procedures To Be Arranged:  N/A  Follow-Up:   See Dr. Burt Knack in 6 months -  You will receive a reminder letter in the mail two months in advance. If you don't receive a letter, please call our office to schedule the follow-up appointment.     At [facility], you and your health needs are our priority.  As part of our continuing mission to provide you with exceptional heart care, we have created designated Provider Care Teams.  These Care Teams include your primary Cardiologist (physician) and Advanced Practice Providers (APPs -  Physician Assistants and Nurse Practitioners) who all work together to provide you with the care you need, when you need it.  Special Instructions:  . Stay safe, wash your hands for at least 20 seconds and wear a mask when needed.  . It was good to talk with you today.    Call the [place] office at [phone] if you have any questions, problems or concerns.

## 2020-02-20 DIAGNOSIS — C44311 Basal cell carcinoma of skin of nose: Secondary | ICD-10-CM | POA: Diagnosis not present

## 2020-02-22 ENCOUNTER — Ambulatory Visit: Payer: Self-pay

## 2020-02-24 ENCOUNTER — Other Ambulatory Visit: Payer: Self-pay

## 2020-02-24 ENCOUNTER — Ambulatory Visit (INDEPENDENT_AMBULATORY_CARE_PROVIDER_SITE_OTHER): Payer: Medicare HMO

## 2020-02-24 DIAGNOSIS — J4540 Moderate persistent asthma, uncomplicated: Secondary | ICD-10-CM | POA: Diagnosis not present

## 2020-02-29 DIAGNOSIS — E6609 Other obesity due to excess calories: Secondary | ICD-10-CM | POA: Diagnosis not present

## 2020-02-29 DIAGNOSIS — Z6832 Body mass index (BMI) 32.0-32.9, adult: Secondary | ICD-10-CM | POA: Diagnosis not present

## 2020-02-29 DIAGNOSIS — M109 Gout, unspecified: Secondary | ICD-10-CM | POA: Diagnosis not present

## 2020-03-08 ENCOUNTER — Other Ambulatory Visit: Payer: Self-pay

## 2020-03-08 ENCOUNTER — Ambulatory Visit: Payer: Medicare HMO | Admitting: Orthopedic Surgery

## 2020-03-08 ENCOUNTER — Ambulatory Visit: Payer: Medicare HMO

## 2020-03-08 VITALS — BP 136/89 | HR 86 | Ht 70.0 in | Wt 223.0 lb

## 2020-03-08 DIAGNOSIS — M25551 Pain in right hip: Secondary | ICD-10-CM | POA: Diagnosis not present

## 2020-03-08 DIAGNOSIS — M25532 Pain in left wrist: Secondary | ICD-10-CM

## 2020-03-08 MED ORDER — METHYLPREDNISOLONE ACETATE 40 MG/ML IJ SUSP
40.0000 mg | Freq: Once | INTRAMUSCULAR | Status: AC
  Administered 2020-03-08: 40 mg via INTRAMUSCULAR

## 2020-03-08 NOTE — Patient Instructions (Signed)
Wear brace as needed for left wrist

## 2020-03-08 NOTE — Progress Notes (Signed)
Chief Complaint  Patient presents with  . Hip Pain    Right hip pain   79 year old male presents with pain in his right hip along the anterolateral aspect of the right groin and somewhat on the side of the iliac crest no history of trauma  Pain seems to be at rest without much pain when weightbearing  He also has some pain in his left wrist with swelling  The right hip pain present for about 2 to 3 weeks  Review of Systems  Constitutional: Negative for chills and fever.  Musculoskeletal: Positive for joint pain. Negative for falls.    Past Medical History:  Diagnosis Date  . AAA (abdominal aortic aneurysm) ([facility]) 01/2008   3.2cm  . Allergy   . Arthritis   . Asthma   . BPH (benign prostatic hyperplasia)   . Chronic knee pain    bilateral  . COPD (chronic obstructive pulmonary disease) ([facility])   . Diabetes mellitus    type II  . Gout   . History of kidney stones   . Hypercholesterolemia   . Hyperlipidemia   . Hypertension   . Lung cancer (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 Normal nuclear stress test 2011  . Obesity   . Obstructive sleep apnea on CPAP   . Presence of indwelling urinary catheter   . Shortness of breath    due to cigarette abuse  . Tobacco abuse    BP 136/89   Pulse 86   Ht 5\' 10"  (1.778 m)   Wt 223 lb (101.2 kg)   BMI 32.00 kg/m   Physical Exam Constitutional:      General: He is not in acute distress.    Appearance: He is well-developed.  Cardiovascular:     Comments: No peripheral edema Skin:    General: Skin is warm and dry.  Neurological:     Mental Status: He is alert and oriented to person, place, and time.     Sensory: No sensory deficit.     Coordination: Coordination normal.     Gait: Gait normal.     Deep Tendon Reflexes: Reflexes are normal and symmetric.    Right hip No tenderness to the right hip no pain with range of motion flexion or internal or external rotation  Leg lengths seem  equal  Left wrist is mildly tender some swelling is noted in soft tissues over the wrist joint  X-ray of the pelvis and left hip look pretty good some arthritis is seen at the inferior margin of the joint with some mild narrowing but otherwise good looking hip x-ray  Encounter Diagnoses  Name Primary?  . Pain in right hip Yes  . Pain in left wrist     Nurse injection IM shot right hip 40 mg Depo-Medrol  No complications were noted  Standard sterile technique was used  Patient will wear a brace on his left wrist for 2weeks is much as possible then as needed  Follow-up as needed

## 2020-03-09 ENCOUNTER — Ambulatory Visit (INDEPENDENT_AMBULATORY_CARE_PROVIDER_SITE_OTHER): Payer: Medicare HMO

## 2020-03-09 ENCOUNTER — Ambulatory Visit: Payer: Medicare HMO | Admitting: Allergy & Immunology

## 2020-03-09 DIAGNOSIS — J4540 Moderate persistent asthma, uncomplicated: Secondary | ICD-10-CM | POA: Diagnosis not present

## 2020-03-14 DIAGNOSIS — L570 Actinic keratosis: Secondary | ICD-10-CM | POA: Diagnosis not present

## 2020-03-16 ENCOUNTER — Ambulatory Visit
Admission: EM | Admit: 2020-03-16 | Discharge: 2020-03-16 | Disposition: A | Discharge status: Home / Self Care | Payer: Medicare HMO | Attending: Emergency Medicine | Admitting: Emergency Medicine

## 2020-03-16 ENCOUNTER — Encounter: Payer: Self-pay | Admitting: Emergency Medicine

## 2020-03-16 DIAGNOSIS — H6123 Impacted cerumen, bilateral: Secondary | ICD-10-CM | POA: Diagnosis not present

## 2020-03-16 NOTE — ED Provider Notes (Signed)
[place]   [id number] 03/16/20 Arrival Time: 4098  CC: Earwax removal  SUBJECTIVE: History from: patient.  Kevin Frank is a 79 y.o. male who presents to the urgent care with a  complaint that he needed bilateral earwax removal. He was refered by audiologist.  Denies a precipitating event, such as swimming or wearing ear plugs.  Patient has tried OTC Debrox without relief.  Denies aggravating factors.  Report similar symptoms in the past.  Denies fever, chills, fatigue, sinus pain, rhinorrhea, ear discharge, sore throat, SOB, wheezing, chest pain, nausea, changes in bowel or bladder habits.    ROS: As per HPI.  All other pertinent ROS negative.     Past Medical History:  Diagnosis Date  . AAA (abdominal aortic aneurysm) ([place]) 01/2008   3.2cm  . Allergy   . Arthritis   . Asthma   . BPH (benign prostatic hyperplasia)   . Chronic knee pain    bilateral  . COPD (chronic obstructive pulmonary disease) ([place])   . Diabetes mellitus    type II  . Gout   . History of kidney stones   . Hypercholesterolemia   . Hyperlipidemia   . Hypertension   . 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 Normal nuclear stress test 2011  . Obesity   . Obstructive sleep apnea on CPAP   . Presence of indwelling urinary catheter   . Shortness of breath    due to cigarette abuse  . Tobacco abuse    Past Surgical History:  Procedure Laterality Date  . ABDOMINAL AORTIC ENDOVASCULAR FENESTRATED STENT GRAFT N/A 04/09/2016   Procedure: ABDOMINAL AORTIC ENDOVASCULAR FENESTRATED STENT GRAFT with left brachial artery access using ultrasound guidance;  Surgeon: Serafina Mitchell, MD;  Location: [facility];  Service: Vascular;  Laterality: N/A;  . [place]   [place]  . COLONOSCOPY  2006   2006: multiple diminutive polyps in rectum, one pedunculated polyp at 10 cm s/p snare removal. Left-sided diverticulum, pedunculated polyps at 35 cm and splenic  flexure s/p snare removal. No path available.   . COLONOSCOPY WITH PROPOFOL N/A 11/24/2019   Procedure: COLONOSCOPY WITH PROPOFOL;  Surgeon: Daneil Dolin, MD;  [facility];  Service: Endoscopy;  Laterality: N/A;  9:45am  . PERIPHERAL VASCULAR CATHETERIZATION N/A 04/10/2016   Procedure: Renal Angiography;  Surgeon: Angelia Mould, MD;  Location: [facility];  Service: Cardiovascular;  Laterality: N/A;  . POLYPECTOMY  11/24/2019   Procedure: POLYPECTOMY;  Surgeon: Daneil Dolin, MD;  [facility];  Service: Endoscopy;;  . TRANSURETHRAL RESECTION OF PROSTATE  01/07/2011   Procedure: TRANSURETHRAL RESECTION OF THE PROSTATE (TURP);  Surgeon: Marissa Nestle;  Location: [facility];  Service: Urology;  Laterality: N/A;   Allergies  Allergen Reactions  . No Known Allergies    Current Facility-Administered Medications on File Prior to Encounter  Medication Dose Route Frequency Provider Last Rate Last Admin  . ipratropium (ATROVENT) nebulizer solution 0.5 mg  0.5 mg Nebulization Once Gean Quint, MD      . levalbuterol Penne Lash) nebulizer solution 1.25 mg  1.25 mg Nebulization Once Gean Quint, MD      . omalizumab Arvid Right) injection 375 mg  375 mg Subcutaneous Q14 Days Valentina Shaggy, MD   375 mg at 03/09/20 1191   Current Outpatient Medications on File Prior to Encounter  Medication Sig Dispense Refill  .  albuterol (VENTOLIN HFA) 108 (90 Base) MCG/ACT inhaler Use 4 puffs every 4-6 hours as needed for cough or wheeze 18 g 2  . allopurinol (ZYLOPRIM) 300 MG tablet Take 1 tablet (300 mg total) by mouth daily. 30 tablet 1  . amLODipine (NORVASC) 10 MG tablet Take 1 tablet (10 mg total) by mouth daily. 14 tablet 0  . atorvastatin (LIPITOR) 20 MG tablet Take 1 tablet (20 mg total) by mouth daily. 90 tablet 1  . clopidogrel (PLAVIX) 75 MG tablet Take 1 tablet (75 mg total) by mouth daily. 30 tablet 1  . Colchicine (MITIGARE) 0.6 MG CAPS Take 1 capsule  by mouth in the morning and at bedtime. 60 capsule 5  . diclofenac Sodium (VOLTAREN) 1 % GEL Apply 4 g topically 4 (four) times daily. 4 g 5  . glipiZIDE (GLUCOTROL XL) 5 MG 24 hr tablet Take 1 tablet (5 mg total) by mouth daily with breakfast. 30 tablet 3  . hydrALAZINE (APRESOLINE) 50 MG tablet TAKE (1) TABLET TWICE DAILY. 180 tablet 2  . losartan (COZAAR) 100 MG tablet TAKE 1 TABLET BY MOUTH ONCE DAILY. 30 tablet 0  . metFORMIN (GLUCOPHAGE) 500 MG tablet Take 1,000 mg by mouth 2 (two) times daily with a meal.     . methocarbamol (ROBAXIN) 500 MG tablet Take 1 tablet (500 mg total) by mouth 3 (three) times daily. 60 tablet 1  . metoprolol succinate (TOPROL-XL) 25 MG 24 hr tablet Take 1 tablet (25 mg total) by mouth daily. 14 tablet 0  . omalizumab (XOLAIR) 150 MG injection Inject 375 mg into the skin every 14 (fourteen) days. 6 each 11  . polyethylene glycol-electrolytes (TRILYTE) 420 g solution Take 4,000 mLs by mouth as directed. 4000 mL 0  . sertraline (ZOLOFT) 50 MG tablet Take 1 tablet (50 mg total) by mouth daily. 30 tablet 1  . torsemide (DEMADEX) 20 MG tablet Take 20 mg by mouth daily.     Social History   Socioeconomic History  . Marital status: Married    Spouse name: Not on file  . Number of children: Not on file  . Years of education: Not on file  . Highest education level: Not on file  Occupational History  . Not on file  Tobacco Use  . Smoking status: Current Every Day Smoker    Packs/day: 3.00    Years: 50.00    Pack years: 150.00    Types: Cigarettes  . Smokeless tobacco: Never Used  . Tobacco comment: 2ppd 10/30/2016 ee  Vaping Use  . Vaping Use: Never used  Substance and Sexual Activity  . Alcohol use: Yes    Alcohol/week: 0.0 standard drinks    Comment: very little  . Drug use: No  . Sexual activity: Not on file  Other Topics Concern  . Not on file  Social History Narrative  . Not on file   Social Determinants of Health   Financial Resource Strain:     . Difficulty of Paying Living Expenses: Not on file  Food Insecurity:   . Worried About Charity fundraiser in the Last Year: Not on file  . Ran Out of Food in the Last Year: Not on file  Transportation Needs:   . Lack of Transportation (Medical): Not on file  . Lack of Transportation (Non-Medical): Not on file  Physical Activity:   . Days of Exercise per Week: Not on file  . Minutes of Exercise per Session: Not on file  Stress:   .  Feeling of Stress : Not on file  Social Connections:   . Frequency of Communication with Friends and Family: Not on file  . Frequency of Social Gatherings with Friends and Family: Not on file  . Attends Religious Services: Not on file  . Active Member of Clubs or Organizations: Not on file  . Attends Archivist Meetings: Not on file  . Marital Status: Not on file  Intimate Partner Violence:   . Fear of Current or Ex-Partner: Not on file  . Emotionally Abused: Not on file  . Physically Abused: Not on file  . Sexually Abused: Not on file   Family History  Problem Relation Age of Onset  . Liver disease Father   . Lymphoma Mother   . Diabetes Mother   . Hypertension Mother   . Cancer Mother   . Mental retardation Other        sibling -- from birth   . Asthma Daughter   . Allergic rhinitis Neg Hx   . Angioedema Neg Hx   . Atopy Neg Hx   . Eczema Neg Hx   . Immunodeficiency Neg Hx   . Colon cancer Neg Hx     OBJECTIVE:  Vitals:   03/16/20 1055  BP: 126/61  Pulse: 61  Resp: 19  Temp: 97.8 F (36.6 C)  TempSrc: Oral  SpO2: 93%     Physical Exam Vitals and nursing note reviewed.  Constitutional:      General: He is not in acute distress.    Appearance: Normal appearance. He is normal weight. He is not ill-appearing, toxic-appearing or diaphoretic.  HENT:     Head: Normocephalic.     Comments: Post ear lavage: Bilateral TM within normal limit    Right Ear: Ear canal and external ear normal. There is impacted cerumen.      Left Ear: Ear canal and external ear normal. There is impacted cerumen.  Cardiovascular:     Rate and Rhythm: Normal rate and regular rhythm.     Pulses: Normal pulses.     Heart sounds: Normal heart sounds. No murmur heard.  No friction rub. No gallop.   Pulmonary:     Effort: Pulmonary effort is normal. No respiratory distress.     Breath sounds: Normal breath sounds. No stridor. No wheezing, rhonchi or rales.  Chest:     Chest wall: No tenderness.  Neurological:     Mental Status: He is alert and oriented to person, place, and time.      Imaging: No results found.   ASSESSMENT & PLAN:  1. Bilateral impacted cerumen     No orders of the defined types were placed in this encounter.  Discharge instructions  Rest and drink plenty of fluids Use OTC Debrox biweekly or monthly to remove wax Take medications as directed and to completion Continue to use OTC ibuprofen and/ or tylenol as needed for pain control Follow up with PCP if symptoms persists Return here or go to the ER if you have any new or worsening symptoms   Reviewed expectations re: course of current medical issues. Questions answered. Outlined signs and symptoms indicating need for more acute intervention. Patient verbalized understanding. After Visit Summary given.      Note: This document was prepared using Dragon voice recognition software and may include unintentional dictation errors.    Emerson Monte, FNP 03/16/20 1110

## 2020-03-16 NOTE — Discharge Instructions (Addendum)
Rest and drink plenty of fluids Use OTC Debrox biweekly or monthly to remove wax Take medications as directed and to completion Continue to use OTC ibuprofen and/ or tylenol as needed for pain control Follow up with PCP if symptoms persists Return here or go to the ER if you have any new or worsening symptoms

## 2020-03-16 NOTE — ED Triage Notes (Signed)
Needs wax removal to bilateral ears per audiologist

## 2020-03-23 ENCOUNTER — Ambulatory Visit: Payer: Self-pay

## 2020-03-28 ENCOUNTER — Ambulatory Visit (INDEPENDENT_AMBULATORY_CARE_PROVIDER_SITE_OTHER): Payer: Medicare HMO

## 2020-03-28 DIAGNOSIS — J4540 Moderate persistent asthma, uncomplicated: Secondary | ICD-10-CM

## 2020-04-05 ENCOUNTER — Ambulatory Visit: Payer: Medicare HMO | Admitting: Orthopedic Surgery

## 2020-04-05 ENCOUNTER — Other Ambulatory Visit: Payer: Self-pay

## 2020-04-05 ENCOUNTER — Ambulatory Visit: Payer: Medicare HMO

## 2020-04-05 VITALS — Ht 70.0 in | Wt 223.0 lb

## 2020-04-05 DIAGNOSIS — G5622 Lesion of ulnar nerve, left upper limb: Secondary | ICD-10-CM | POA: Diagnosis not present

## 2020-04-05 DIAGNOSIS — Z0001 Encounter for general adult medical examination with abnormal findings: Secondary | ICD-10-CM | POA: Diagnosis not present

## 2020-04-05 DIAGNOSIS — Z23 Encounter for immunization: Secondary | ICD-10-CM | POA: Diagnosis not present

## 2020-04-05 DIAGNOSIS — M25522 Pain in left elbow: Secondary | ICD-10-CM

## 2020-04-05 DIAGNOSIS — M1991 Primary osteoarthritis, unspecified site: Secondary | ICD-10-CM | POA: Diagnosis not present

## 2020-04-05 DIAGNOSIS — E119 Type 2 diabetes mellitus without complications: Secondary | ICD-10-CM | POA: Diagnosis not present

## 2020-04-05 DIAGNOSIS — I10 Essential (primary) hypertension: Secondary | ICD-10-CM | POA: Diagnosis not present

## 2020-04-05 DIAGNOSIS — J449 Chronic obstructive pulmonary disease, unspecified: Secondary | ICD-10-CM | POA: Diagnosis not present

## 2020-04-05 DIAGNOSIS — E7849 Other hyperlipidemia: Secondary | ICD-10-CM | POA: Diagnosis not present

## 2020-04-05 DIAGNOSIS — M109 Gout, unspecified: Secondary | ICD-10-CM | POA: Diagnosis not present

## 2020-04-05 DIAGNOSIS — Z6832 Body mass index (BMI) 32.0-32.9, adult: Secondary | ICD-10-CM | POA: Diagnosis not present

## 2020-04-05 MED ORDER — GABAPENTIN 100 MG PO CAPS: 100.0000 mg | ORAL_CAPSULE | Freq: Three times a day (TID) | ORAL | 2 refills | Status: DC

## 2020-04-05 MED ORDER — PREDNISONE 10 MG PO TABS: 10.0000 mg | ORAL_TABLET | Freq: Three times a day (TID) | ORAL | 0 refills | Status: DC

## 2020-04-05 NOTE — Patient Instructions (Addendum)
Come back 4 weeks    Cubital Tunnel Syndrome  Cubital tunnel syndrome is a condition that causes pain and weakness of the forearm and hand. It happens when one of the nerves that runs along the inside of the elbow joint (ulnar nerve) becomes irritated. This condition is usually caused by repeated arm motions that are done during sports or work-related activities. What are the causes? This condition may be caused by:  Increased pressure on the ulnar nerve at the elbow, arm, or forearm. This can result from: ? Irritation caused by repeated elbow bending. ? Poorly healed elbow fractures. ? Tumors in the elbow. These are usually noncancerous (benign). ? Scar tissue that develops in the elbow after an injury. ? Bony growths (spurs) near the ulnar nerve.  Stretching of the nerve due to loose elbow ligaments.  Trauma to the nerve at the elbow. What increases the risk? The following factors may make you more likely to develop this condition:  Doing manual labor that requires frequent bending of the elbow.  Playing sports that include repeated or strenuous throwing motions, such as baseball.  Playing contact sports, such as football or lacrosse.  Not warming up properly before activities.  Having diabetes.  Having an underactive thyroid (hypothyroidism). What are the signs or symptoms? Symptoms of this condition include:  Clumsiness or weakness of the hand.  Tenderness of the inner elbow.  Aching or soreness of the inner elbow, forearm, or fingers, especially the little finger or the ring finger.  Increased pain when forcing the elbow to bend.  Reduced control when throwing objects.  Tingling, numbness, or a burning feeling inside the forearm or in part of the hand or fingers, especially the little finger or the ring finger.  Sharp pains that shoot from the elbow down to the wrist and hand.  The inability to grip or pinch hard. How is this diagnosed? This condition is  diagnosed based on:  Your symptoms and medical history. Your health care provider will also ask for details about any injury.  A physical exam. You may also have tests, including:  Electromyogram (EMG). This test measures electrical signals sent by your nerves into the muscles.  Nerve conduction study. This test measures how well electrical signals pass through your nerves.  Imaging tests, such as X-rays, ultrasound, and MRI. These tests check for possible causes of your condition. How is this treated? This condition may be treated by:  Stopping the activities that are causing your symptoms to get worse.  Icing and taking medicines to reduce pain and swelling.  Wearing a splint to prevent your elbow from bending, or wearing an elbow pad where the ulnar nerve is closest to the skin.  Working with a physical therapist in less severe cases. This may help to: ? Decrease your symptoms. ? Improve the strength and range of motion of your elbow, forearm, and hand. If these treatments do not help, surgery may be needed. Follow these instructions at home: If you have a splint:  Wear the splint as told by your health care provider. Remove it only as told by your health care provider.  Loosen the splint if your fingers tingle, become numb, or turn cold and blue.  Keep the splint clean.  If the splint is not waterproof: ? Do not let it get wet. ? Cover it with a watertight covering when you take a bath or shower. Managing pain, stiffness, and swelling   If directed, put ice on the injured area: ?  Put ice in a plastic bag. ? Place a towel between your skin and the bag. ? Leave the ice on for 20 minutes, 2-3 times a day.  Move your fingers often to avoid stiffness and to lessen swelling.  Raise (elevate) the injured area above the level of your heart while you are sitting or lying down. General instructions  Take over-the-counter and prescription medicines only as told by your  health care provider.  Do any exercise or physical therapy as told by your health care provider.  Do not drive or use heavy machinery while taking prescription pain medicine.  If you were given an elbow pad, wear it as told by your health care provider.  Keep all follow-up visits as told by your health care provider. This is important. Contact a health care provider if:  Your symptoms get worse.  Your symptoms do not get better with treatment.  You have new pain.  Your hand on the injured side feels numb or cold. Summary  Cubital tunnel syndrome is a condition that causes pain and weakness of the forearm and hand.  You are more likely to develop this condition if you do work or play sports that involve repeated arm movements.  This condition is often treated by stopping repetitive activities, applying ice, and using anti-inflammatory medicines.  In rare cases, surgery may be needed. This information is not intended to replace advice given to you by your health care provider. Make sure you discuss any questions you have with your health care provider. Document Revised: 10/26/2017 Document Reviewed: 10/26/2017 Elsevier Patient Education  [place].

## 2020-04-05 NOTE — Progress Notes (Signed)
Chief Complaint  Patient presents with  . Elbow Pain    Left elbow pain   History 79 year old male no history of trauma recent pain medial and posterior aspect left elbow denies numbness or tingling  Left elbow exam he has swelling over the olecranon bursa but it is nontender lateral elbow nontender full range of motion medial elbow soft tissue nontender but tenderness in the cubital tunnel without numbness or tingling or weakness left upper extremity    Encounter Diagnoses  Name Primary?  . Pain in left elbow   . Ulnar neuritis, left Yes    Meds ordered this encounter  Medications  . gabapentin (NEURONTIN) 100 MG capsule    Sig: Take 1 capsule (100 mg total) by mouth 3 (three) times daily.    Dispense:  90 capsule    Refill:  2  . predniSONE (DELTASONE) 10 MG tablet    Sig: Take 1 tablet (10 mg total) by mouth 3 (three) times daily.    Dispense:  42 tablet    Refill:  0    Recheck 4 weeks

## 2020-04-11 ENCOUNTER — Other Ambulatory Visit: Payer: Self-pay

## 2020-04-11 ENCOUNTER — Ambulatory Visit (INDEPENDENT_AMBULATORY_CARE_PROVIDER_SITE_OTHER): Payer: Medicare HMO

## 2020-04-11 DIAGNOSIS — J4540 Moderate persistent asthma, uncomplicated: Secondary | ICD-10-CM

## 2020-04-19 ENCOUNTER — Other Ambulatory Visit: Payer: Self-pay

## 2020-04-19 ENCOUNTER — Ambulatory Visit: Payer: Medicare HMO | Admitting: Orthopedic Surgery

## 2020-04-19 ENCOUNTER — Encounter: Payer: Self-pay | Admitting: Orthopedic Surgery

## 2020-04-19 VITALS — BP 169/75 | HR 76 | Ht 70.0 in | Wt 230.0 lb

## 2020-04-19 DIAGNOSIS — G8929 Other chronic pain: Secondary | ICD-10-CM

## 2020-04-19 DIAGNOSIS — M25512 Pain in left shoulder: Secondary | ICD-10-CM

## 2020-04-19 DIAGNOSIS — M25522 Pain in left elbow: Secondary | ICD-10-CM | POA: Diagnosis not present

## 2020-04-19 DIAGNOSIS — G5622 Lesion of ulnar nerve, left upper limb: Secondary | ICD-10-CM

## 2020-04-19 NOTE — Progress Notes (Signed)
Chief Complaint  Patient presents with  . Gout    had flare left wrist /feels better now     Encounter Diagnoses  Name Primary?  . Pain in left elbow Yes  . Ulnar neuritis, left   . Chronic left shoulder pain     79 year old male with gout had an acute flare of his left wrist wanted that checked out  Left wrist today is nontender full range of motion no swelling he says is much better  His shoulder is not symptomatic and his ulnar neuritis has resolved as well

## 2020-04-20 ENCOUNTER — Inpatient Hospital Stay (HOSPITAL_COMMUNITY): Payer: Medicare HMO | Attending: Hematology

## 2020-04-20 ENCOUNTER — Ambulatory Visit (HOSPITAL_COMMUNITY)
Admission: RE | Admit: 2020-04-20 | Discharge: 2020-04-20 | Disposition: A | Discharge status: Home / Self Care | Payer: Medicare HMO | Source: Ambulatory Visit | Attending: Hematology | Admitting: Hematology

## 2020-04-20 DIAGNOSIS — I700 Atherosclerosis of aorta: Secondary | ICD-10-CM | POA: Diagnosis not present

## 2020-04-20 DIAGNOSIS — I2510 Atherosclerotic heart disease of native coronary artery without angina pectoris: Secondary | ICD-10-CM | POA: Diagnosis not present

## 2020-04-20 DIAGNOSIS — C3411 Malignant neoplasm of upper lobe, right bronchus or lung: Secondary | ICD-10-CM | POA: Diagnosis not present

## 2020-04-20 DIAGNOSIS — J432 Centrilobular emphysema: Secondary | ICD-10-CM | POA: Diagnosis not present

## 2020-04-20 DIAGNOSIS — C3491 Malignant neoplasm of unspecified part of right bronchus or lung: Secondary | ICD-10-CM

## 2020-04-20 LAB — POCT I-STAT CREATININE: Creatinine, Ser: 0.9 mg/dL (ref 0.61–1.24)

## 2020-04-20 MED ORDER — IOHEXOL 300 MG/ML  SOLN
75.0000 mL | Freq: Once | INTRAMUSCULAR | Status: AC | PRN
  Administered 2020-04-20: 75 mL via INTRAVENOUS

## 2020-04-26 ENCOUNTER — Other Ambulatory Visit: Payer: Self-pay

## 2020-04-26 ENCOUNTER — Inpatient Hospital Stay (HOSPITAL_COMMUNITY): Payer: Medicare HMO | Attending: Hematology | Admitting: Hematology

## 2020-04-26 VITALS — BP 166/70 | HR 91 | Temp 97.2°F | Resp 20 | Wt 235.2 lb

## 2020-04-26 DIAGNOSIS — C3491 Malignant neoplasm of unspecified part of right bronchus or lung: Secondary | ICD-10-CM

## 2020-04-26 DIAGNOSIS — C3411 Malignant neoplasm of upper lobe, right bronchus or lung: Secondary | ICD-10-CM | POA: Insufficient documentation

## 2020-04-26 DIAGNOSIS — F17210 Nicotine dependence, cigarettes, uncomplicated: Secondary | ICD-10-CM | POA: Insufficient documentation

## 2020-04-26 NOTE — Patient Instructions (Signed)
[place] at [facility] Discharge Instructions  You were seen today by Dr. Delton Coombes. He went over your recent results and scans. You will be scheduled for a CT scan of your chest before your next visit. Dr. Delton Coombes will see you back in 6 months for labs and follow up.   Thank you for choosing [facility] at [address] [facility] to provide your oncology and hematology care.  To afford each patient quality time with our provider, please arrive at least 15 minutes before your scheduled appointment time.   If you have a lab appointment with the [facility] please come in thru the Main Entrance and check in at the main information desk  You need to re-schedule your appointment should you arrive 10 or more minutes late.  We strive to give you quality time with our providers, and arriving late affects you and other patients whose appointments are after yours.  Also, if you no show three or more times for appointments you may be dismissed from the clinic at the providers discretion.     Again, thank you for choosing [facility].  Our hope is that these requests will decrease the amount of time that you wait before being seen by our physicians.       _____________________________________________________________  Should you have questions after your visit to [facility], please contact our office at [phone] between the hours of 8:00 a.m. and 4:30 p.m.  Voicemails left after 4:00 p.m. will not be returned until the following business day.  For prescription refill requests, have your pharmacy contact our office and allow 72 hours.    Cancer Center Support Programs:   > Cancer Support Group  2nd Tuesday of the month 1pm-2pm, Journey Room

## 2020-04-26 NOTE — Progress Notes (Signed)
Kevin Frank, Kevin Frank [id number]   CLINIC:  Medical Oncology/Hematology  PCP:  Sharilyn Sites, MD [address] 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  PRIOR THERAPY: SBRT 54 Gy in 3 fractions to right lung from 05/05/2018 to 05/12/2018  NGS Results: Not done  CURRENT THERAPY: Observation  BRIEF ONCOLOGIC HISTORY:  Oncology History  Squamous cell carcinoma of right lung ([place])  03/29/2018 Initial Diagnosis   Squamous cell carcinoma of right lung ([place])   03/29/2018 Cancer Staging   Staging form: Lung, AJCC 8th Edition - Clinical stage from 03/29/2018: Stage IA3 (cT1c, cN0, cM0) - Signed by Derek Jack, MD on 03/29/2018     CANCER STAGING: Cancer Staging Squamous cell carcinoma of right lung [facility]) Staging form: Lung, AJCC 8th Edition - Clinical stage from 03/29/2018: Stage IA3 (cT1c, cN0, cM0) - Signed by Derek Jack, MD on 03/29/2018   INTERVAL HISTORY:  Kevin Frank, a 79 y.o. male, returns for routine follow-up of his squamous cell carcinoma of the right lung. Kevin Frank was last seen on 10/20/2019.   Today he reports feeling well. His SOB is stable and he is tolerating some physical work. He uses his inhaler when he works and he did not use his inhaler this AM; he has a productive cough with clear sputum but denies hemoptysis or CP. His appetite is excellent.   He continues smoking 2 PPD.   REVIEW OF SYSTEMS:  Review of Systems  Constitutional: Negative for appetite change and fatigue.  Respiratory: Positive for cough (clear sputum) and shortness of breath (w/ exertion). Negative for wheezing.   Cardiovascular: Negative for chest pain.  All other systems reviewed and are negative.   PAST MEDICAL/SURGICAL HISTORY:  Past Medical History:  Diagnosis Date  . AAA (abdominal aortic aneurysm) ([place]) 01/2008   3.2cm  . Allergy   .  Arthritis   . Asthma   . BPH (benign prostatic hyperplasia)   . Chronic knee pain    bilateral  . COPD (chronic obstructive pulmonary disease) ([place])   . Diabetes mellitus    type II  . Gout   . History of kidney stones   . Hypercholesterolemia   . Hyperlipidemia   . Hypertension   . Lung cancer ([place]) 2019   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   . Normal nuclear stress test 2011  . Obesity   . Obstructive sleep apnea on CPAP   . Presence of indwelling urinary catheter   . Shortness of breath    due to cigarette abuse  . Tobacco abuse    Past Surgical History:  Procedure Laterality Date  . ABDOMINAL AORTIC ENDOVASCULAR FENESTRATED STENT GRAFT N/A 04/09/2016   Procedure: ABDOMINAL AORTIC ENDOVASCULAR FENESTRATED STENT GRAFT with left brachial artery access using ultrasound guidance;  Surgeon: Serafina Mitchell, MD;  Location: [facility];  Service: Vascular;  Laterality: N/A;  . [place]   [place]  . COLONOSCOPY  2006   2006: multiple diminutive polyps in rectum, one pedunculated polyp at 10 cm s/p snare removal. Left-sided diverticulum, pedunculated polyps at 35 cm and splenic flexure s/p snare removal. No path available.   . COLONOSCOPY WITH PROPOFOL N/A 11/24/2019   Procedure: COLONOSCOPY WITH PROPOFOL;  Surgeon: Daneil Dolin, MD;  [facility];  Service: Endoscopy;  Laterality: N/A;  9:45am  . PERIPHERAL VASCULAR CATHETERIZATION  N/A 04/10/2016   Procedure: Renal Angiography;  Surgeon: Angelia Mould, MD;  Location: [facility];  Service: Cardiovascular;  Laterality: N/A;  . POLYPECTOMY  11/24/2019   Procedure: POLYPECTOMY;  Surgeon: Daneil Dolin, MD;  [facility];  Service: Endoscopy;;  . TRANSURETHRAL RESECTION OF PROSTATE  01/07/2011   Procedure: TRANSURETHRAL RESECTION OF THE PROSTATE (TURP);  Surgeon: Marissa Nestle;  Location: [facility];  Service: Urology;  Laterality: N/A;    SOCIAL HISTORY:  Social History     Socioeconomic History  . Marital status: Married    Spouse name: Not on file  . Number of children: Not on file  . Years of education: Not on file  . Highest education level: Not on file  Occupational History  . Not on file  Tobacco Use  . Smoking status: Current Every Day Smoker    Packs/day: 3.00    Years: 50.00    Pack years: 150.00    Types: Cigarettes  . Smokeless tobacco: Never Used  . Tobacco comment: 2ppd 10/30/2016 ee  Vaping Use  . Vaping Use: Never used  Substance and Sexual Activity  . Alcohol use: Yes    Alcohol/week: 0.0 standard drinks    Comment: very little  . Drug use: No  . Sexual activity: Not on file  Other Topics Concern  . Not on file  Social History Narrative  . Not on file   Social Determinants of Health   Financial Resource Strain:   . Difficulty of Paying Living Expenses: Not on file  Food Insecurity:   . Worried About Charity fundraiser in the Last Year: Not on file  . Ran Out of Food in the Last Year: Not on file  Transportation Needs:   . Lack of Transportation (Medical): Not on file  . Lack of Transportation (Non-Medical): Not on file  Physical Activity:   . Days of Exercise per Week: Not on file  . Minutes of Exercise per Session: Not on file  Stress:   . Feeling of Stress : Not on file  Social Connections:   . Frequency of Communication with Friends and Family: Not on file  . Frequency of Social Gatherings with Friends and Family: Not on file  . Attends Religious Services: Not on file  . Active Member of Clubs or Organizations: Not on file  . Attends Archivist Meetings: Not on file  . Marital Status: Not on file  Intimate Partner Violence:   . Fear of Current or Ex-Partner: Not on file  . Emotionally Abused: Not on file  . Physically Abused: Not on file  . Sexually Abused: Not on file    FAMILY HISTORY:  Family History  Problem Relation Age of Onset  . Liver disease Father   . Lymphoma Mother   . Diabetes  Mother   . Hypertension Mother   . Cancer Mother   . Mental retardation Other        sibling -- from birth   . Asthma Daughter   . Allergic rhinitis Neg Hx   . Angioedema Neg Hx   . Atopy Neg Hx   . Eczema Neg Hx   . Immunodeficiency Neg Hx   . Colon cancer Neg Hx     CURRENT MEDICATIONS:  Current Outpatient Medications  Medication Sig Dispense Refill  . albuterol (VENTOLIN HFA) 108 (90 Base) MCG/ACT inhaler Use 4 puffs every 4-6 hours as needed for cough or wheeze 18 g 2  .  allopurinol (ZYLOPRIM) 300 MG tablet Take 1 tablet (300 mg total) by mouth daily. 30 tablet 1  . amLODipine (NORVASC) 10 MG tablet Take 1 tablet (10 mg total) by mouth daily. 14 tablet 0  . atorvastatin (LIPITOR) 20 MG tablet Take 1 tablet (20 mg total) by mouth daily. 90 tablet 1  . clopidogrel (PLAVIX) 75 MG tablet Take 1 tablet (75 mg total) by mouth daily. 30 tablet 1  . Colchicine (MITIGARE) 0.6 MG CAPS Take 1 capsule by mouth in the morning and at bedtime. 60 capsule 5  . diclofenac Sodium (VOLTAREN) 1 % GEL Apply 4 g topically 4 (four) times daily. 4 g 5  . gabapentin (NEURONTIN) 100 MG capsule Take 1 capsule (100 mg total) by mouth 3 (three) times daily. 90 capsule 2  . glipiZIDE (GLUCOTROL XL) 5 MG 24 hr tablet Take 1 tablet (5 mg total) by mouth daily with breakfast. 30 tablet 3  . hydrALAZINE (APRESOLINE) 50 MG tablet TAKE (1) TABLET TWICE DAILY. 180 tablet 2  . losartan (COZAAR) 100 MG tablet TAKE 1 TABLET BY MOUTH ONCE DAILY. 30 tablet 0  . metFORMIN (GLUCOPHAGE) 500 MG tablet Take 1,000 mg by mouth 2 (two) times daily with a meal.     . methocarbamol (ROBAXIN) 500 MG tablet Take 1 tablet (500 mg total) by mouth 3 (three) times daily. 60 tablet 1  . metoprolol succinate (TOPROL-XL) 25 MG 24 hr tablet Take 1 tablet (25 mg total) by mouth daily. 14 tablet 0  . omalizumab (XOLAIR) 150 MG injection Inject 375 mg into the skin every 14 (fourteen) days. 6 each 11  . polyethylene glycol-electrolytes  (TRILYTE) 420 g solution Take 4,000 mLs by mouth as directed. 4000 mL 0  . predniSONE (DELTASONE) 10 MG tablet Take 1 tablet (10 mg total) by mouth 3 (three) times daily. 42 tablet 0  . sertraline (ZOLOFT) 50 MG tablet Take 1 tablet (50 mg total) by mouth daily. 30 tablet 1  . torsemide (DEMADEX) 20 MG tablet Take 20 mg by mouth daily.     Current Facility-Administered Medications  Medication Dose Route Frequency Provider Last Rate Last Admin  . ipratropium (ATROVENT) nebulizer solution 0.5 mg  0.5 mg Nebulization Once Gean Quint, MD      . levalbuterol Penne Lash) nebulizer solution 1.25 mg  1.25 mg Nebulization Once Gean Quint, MD      . omalizumab Arvid Right) injection 375 mg  375 mg Subcutaneous Q14 Days Valentina Shaggy, MD   375 mg at 04/11/20 5093    ALLERGIES:  Allergies  Allergen Reactions  . No Known Allergies     PHYSICAL EXAM:  Performance status (ECOG): 1 - Symptomatic but completely ambulatory  Vitals:   04/26/20 0827  BP: (!) 166/70  Pulse: 91  Resp: 20  Temp: (!) 97.2 F (36.2 C)  SpO2: 95%   Wt Readings from Last 3 Encounters:  04/26/20 235 lb 3.2 oz (106.7 kg)  04/19/20 230 lb (104.3 kg)  04/05/20 223 lb (101.2 kg)   Physical Exam Vitals reviewed.  Constitutional:      Appearance: Normal appearance. He is obese.  Cardiovascular:     Rate and Rhythm: Normal rate and regular rhythm.     Pulses: Normal pulses.     Heart sounds: Normal heart sounds.  Pulmonary:     Effort: Pulmonary effort is normal.     Breath sounds: Wheezing present.  Musculoskeletal:     Right lower leg: Edema (1+) present.  Left lower leg: Edema (1+) present.  Neurological:     General: No focal deficit present.     Mental Status: He is alert and oriented to person, place, and time.  Psychiatric:        Mood and Affect: Mood normal.        Behavior: Behavior normal.      LABORATORY DATA:  I have reviewed the labs as listed.  CBC Latest Ref Rng & Units  11/22/2019 09/06/2019 04/26/2019  WBC 4.0 - 10.5 K/uL 8.8 10.6(H) 11.7(H)  Hemoglobin 13.0 - 17.0 g/dL 15.4 15.3 15.7  Hematocrit 39 - 52 % 46.8 46.7 47.2  Platelets 150 - 400 K/uL 205 199 205   CMP Latest Ref Rng & Units 04/20/2020 11/22/2019 09/06/2019  Glucose 70 - 99 mg/dL - 140(H) 205(H)  BUN 8 - 23 mg/dL - 14 13  Creatinine 0.61 - 1.24 mg/dL 0.90 1.02 1.03  Sodium 135 - 145 mmol/L - 139 136  Potassium 3.5 - 5.1 mmol/L - 4.4 3.6  Chloride 98 - 111 mmol/L - 103 102  CO2 22 - 32 mmol/L - 25 23  Calcium 8.9 - 10.3 mg/dL - 9.1 9.3  Total Protein 6.5 - 8.1 g/dL - 7.0 -  Total Bilirubin 0.3 - 1.2 mg/dL - 0.4 -  Alkaline Phos 38 - 126 U/L - 103 -  AST 15 - 41 U/L - 19 -  ALT 0 - 44 U/L - 21 -    DIAGNOSTIC IMAGING:  I have independently reviewed the scans and discussed with the patient. CT Chest W Contrast  Result Date: 04/20/2020 CLINICAL DATA:  Right upper lobe lung cancer restaging, status post radiation therapy EXAM: CT CHEST WITH CONTRAST TECHNIQUE: Multidetector CT imaging of the chest was performed during intravenous contrast administration. CONTRAST:  30mL OMNIPAQUE IOHEXOL 300 MG/ML  SOLN COMPARISON:  10/18/2019 FINDINGS: Cardiovascular: Aortic atherosclerosis. Cardiomegaly. Extensive 3 vessel coronary artery calcifications and/or stents. No pericardial effusion. Mediastinum/Nodes: Unchanged prominent pretracheal lymph node measuring 2.4 x 0.9 cm (series 2, image 64). Thyroid gland, trachea, and esophagus demonstrate no significant findings. Lungs/Pleura: Moderate to severe centrilobular emphysema. Diffuse bilateral bronchial wall thickening. Unchanged post treatment appearance of an irregular nodule of the posterior right upper lobe, measuring 2.1 x 0.8 cm (series 4, image 35). Stable small pulmonary nodules, for example a 4 mm fissural nodule of the right lower lobe (series 4, image 102). No pleural effusion or pneumothorax. Upper Abdomen: No acute abnormality. Partially imaged aortic  stent endograft. Musculoskeletal: No chest wall mass or suspicious bone lesions identified. IMPRESSION: 1. Unchanged post treatment appearance of an irregular nodule of the posterior right upper lobe, measuring 2.1 x 0.8 cm. 2. Stable small pulmonary nodules, likely incidental and benign. Attention on follow-up. 3. Unchanged prominent pretracheal lymph node. Attention on follow-up. 4. Emphysema (ICD10-J43.9). Diffuse bilateral bronchial wall thickening, consistent with nonspecific infectious or inflammatory bronchitis. 5. Cardiomegaly and coronary artery disease. 6.  Aortic Atherosclerosis (ICD10-I70.0). Electronically Signed   By: Eddie Candle M.D.   On: 04/20/2020 14:17     ASSESSMENT:  1.  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 -He was felt not to be a surgical candidate. -SBRT to the right lung lesion from 05/05/2018 through 05/12/2018, 54 Gray in 3 fractions of 18 [place] Cables. -He is continuing to smoke 2 packs of cigarettes per day. -CT chest on 04/20/2020 shows unchanged posttreatment appearance of irregular nodule of  the posterior right upper lobe measuring 2.1 x 0.8 cm.  Stable small pulmonary nodules.  Unchanged prominent pretracheal lymph node measuring 2.4 x 0.9 cm.   PLAN:  1.  Stage I (T1CN0 M0) squamous cell carcinoma right upper lobe of the lung: -He is continuing to smoke 2 packs of cigarettes per day. -His breathing is at his baseline.  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 -We reviewed CT scan from 04/20/2020 which showed stable right upper lobe nodule and other stable small pulmonary nodules.  Reviewed labs from June which are grossly within normal limits. -Recommend 6 months follow-up with CT scan and labs.   Orders placed this encounter:  No orders of the defined types were placed in this encounter.    Derek Jack, MD [place] [phone]   I,  Milinda Antis, am acting as a scribe for Dr. Sanda Linger.  I, Derek Jack MD, have reviewed the above documentation for accuracy and completeness, and I agree with the above.

## 2020-05-02 ENCOUNTER — Ambulatory Visit (INDEPENDENT_AMBULATORY_CARE_PROVIDER_SITE_OTHER): Payer: Medicare HMO

## 2020-05-02 ENCOUNTER — Other Ambulatory Visit: Payer: Self-pay

## 2020-05-02 DIAGNOSIS — J4540 Moderate persistent asthma, uncomplicated: Secondary | ICD-10-CM

## 2020-05-03 ENCOUNTER — Ambulatory Visit: Payer: Medicare HMO | Admitting: Orthopedic Surgery

## 2020-05-03 ENCOUNTER — Other Ambulatory Visit: Payer: Self-pay

## 2020-05-03 ENCOUNTER — Telehealth: Payer: Self-pay

## 2020-05-03 VITALS — Ht 70.0 in | Wt 235.0 lb

## 2020-05-03 DIAGNOSIS — G5622 Lesion of ulnar nerve, left upper limb: Secondary | ICD-10-CM

## 2020-05-03 NOTE — Progress Notes (Signed)
Chief Complaint  Patient presents with  . Follow-up    Recheck on left elbow    Encounter Diagnosis  Name Primary?  [name] Ulnar neuritis, left Yes    Patient is doing well today with his left elbow.  He only has pain if he leaves it sitting on a counter.  He has full range of motion no tenderness and normal strength in his hand  Fu prn

## 2020-05-03 NOTE — Telephone Encounter (Signed)
Received call from patient's wife about needing another CT. Patient just had a chest CT - we need an abd-pelvis CT. Called patient back, told him we may still need a CT, but I wasn't sure. I will speak to Dr. Trula Slade on Monday and give them a call back. Patient aware.

## 2020-05-09 ENCOUNTER — Encounter: Payer: Self-pay | Admitting: Orthopedic Surgery

## 2020-05-09 ENCOUNTER — Ambulatory Visit: Payer: Medicare HMO | Admitting: Orthopedic Surgery

## 2020-05-09 ENCOUNTER — Other Ambulatory Visit: Payer: Self-pay

## 2020-05-09 VITALS — BP 174/80 | HR 79 | Ht 70.0 in | Wt 235.0 lb

## 2020-05-09 DIAGNOSIS — M25512 Pain in left shoulder: Secondary | ICD-10-CM

## 2020-05-09 DIAGNOSIS — G8929 Other chronic pain: Secondary | ICD-10-CM

## 2020-05-09 NOTE — Patient Instructions (Signed)

## 2020-05-09 NOTE — Progress Notes (Signed)
DEAGEN KRASS  05/09/2020  Body mass index is 33.72 kg/m.   MEDICAL DECISION MAKING  Encounter Diagnosis  Name Primary?  . Chronic left shoulder pain Yes     MANAGEMENT  Procedure note the subacromial injection shoulder left   Verbal consent was obtained to inject the  Left   Shoulder  Timeout was completed to confirm the injection site is a subacromial space of the  left  shoulder  Medication used Depo-Medrol 40 mg and lidocaine 1% 3 cc  Anesthesia was provided by ethyl chloride  The injection was performed in the left  posterior subacromial space. After pinning the skin with alcohol and anesthetized the skin with ethyl chloride the subacromial space was injected using a 20-gauge needle. There were no complications  Sterile dressing was applied.    S:  Chief Complaint  Patient presents with  . Shoulder Pain    left   Chronic recurrent shoulder pain   Poor surgical candidate so he comes in for injections as needed     O: BP (!) 174/80   Pulse 79   Ht 5\' 10"  (1.778 m)   Wt 235 lb (106.6 kg)   BMI 33.72 kg/m   Physical Exam Musculoskeletal:     Left shoulder: Tenderness present. No swelling, deformity, effusion, laceration, bony tenderness or crepitus. Decreased range of motion. Normal pulse.    Procedure note the subacromial injection shoulder left   Verbal consent was obtained to inject the  Left   Shoulder  Timeout was completed to confirm the injection site is a subacromial space of the  left  shoulder  Medication used Depo-Medrol 40 mg and lidocaine 1% 3 cc  Anesthesia was provided by ethyl chloride  The injection was performed in the left  posterior subacromial space. After pinning the skin with alcohol and anesthetized the skin with ethyl chloride the subacromial space was injected using a 20-gauge needle. There were no complications  Sterile dressing was applied.           No orders of the defined types were placed in this  encounter.     Arther Abbott, MD  05/09/2020 12:09 PM

## 2020-05-10 ENCOUNTER — Other Ambulatory Visit: Payer: Self-pay | Admitting: Allergy & Immunology

## 2020-05-16 ENCOUNTER — Ambulatory Visit (INDEPENDENT_AMBULATORY_CARE_PROVIDER_SITE_OTHER): Payer: Medicare HMO

## 2020-05-16 ENCOUNTER — Telehealth: Payer: Self-pay

## 2020-05-16 ENCOUNTER — Other Ambulatory Visit: Payer: Self-pay

## 2020-05-16 DIAGNOSIS — J4540 Moderate persistent asthma, uncomplicated: Secondary | ICD-10-CM

## 2020-05-16 NOTE — Telephone Encounter (Signed)
Patient stopped by to get his shot today. He came over to my desk to see if I knew how he could get more CPAP equipment as he is having a hard time finding who sends him new equipment.  I have tried looking through the patients chart to see if we have done it or other providers in [place]. I did not see anything on my end. I did call the patients PCP to see if they manage his CPAP. The care coordinator transferred me to the health coach who helps manage CPAP's within their office. I left her a voicemail to see if she can help me out with this process.  Just a FYI Dr Ernst Bowler, i'm not sure if you know who is managing his CPAP Care.   Thanks

## 2020-05-16 NOTE — Telephone Encounter (Signed)
I spoke with the PCP and they do not manage his cpap but they did recommend possibly [facility] since they use to be [facility] where he believes he got his CPAP Machine from.

## 2020-05-21 ENCOUNTER — Other Ambulatory Visit: Payer: Self-pay

## 2020-05-21 ENCOUNTER — Encounter: Payer: Self-pay | Admitting: Orthopedic Surgery

## 2020-05-21 ENCOUNTER — Ambulatory Visit (INDEPENDENT_AMBULATORY_CARE_PROVIDER_SITE_OTHER): Payer: Medicare HMO | Admitting: Orthopedic Surgery

## 2020-05-21 VITALS — Ht 70.0 in | Wt 235.0 lb

## 2020-05-21 DIAGNOSIS — M7022 Olecranon bursitis, left elbow: Secondary | ICD-10-CM | POA: Diagnosis not present

## 2020-05-21 NOTE — Progress Notes (Signed)
Chief Complaint  Patient presents with  . Elbow Problem    left elbow aspirate and inject    Pain left elbow with swelling  This is not his prior ulnar neuritis is pain directly over the olecranon  He has a small amount of fluid there no erythema he has good range of motion neurovascular intact skin intact  Encounter Diagnosis  Name Primary?  [name] Olecranon bursitis, left elbow Yes    Procedure note   Verbal consent taken timeout completed for left elbow aspiration injection left arm bursa Aspirate inject left elbow aspirated 2 cc of clear yellow fluid I injected 40 of Depo-Medrol and 1 cc of lidocaine  No complications

## 2020-05-23 DIAGNOSIS — E669 Obesity, unspecified: Secondary | ICD-10-CM | POA: Diagnosis not present

## 2020-05-23 DIAGNOSIS — Z6832 Body mass index (BMI) 32.0-32.9, adult: Secondary | ICD-10-CM | POA: Diagnosis not present

## 2020-05-23 DIAGNOSIS — M2550 Pain in unspecified joint: Secondary | ICD-10-CM | POA: Diagnosis not present

## 2020-05-23 DIAGNOSIS — M1A09X0 Idiopathic chronic gout, multiple sites, without tophus (tophi): Secondary | ICD-10-CM | POA: Diagnosis not present

## 2020-05-28 NOTE — Telephone Encounter (Signed)
Thank you for taking care of that, [facility]!   Salvatore Marvel, MD [facility] and [place] [facility] [place]

## 2020-05-29 ENCOUNTER — Ambulatory Visit: Payer: Medicare HMO | Admitting: Nurse Practitioner

## 2020-05-29 ENCOUNTER — Encounter: Payer: Self-pay | Admitting: Internal Medicine

## 2020-05-29 ENCOUNTER — Other Ambulatory Visit: Payer: Self-pay | Admitting: *Deleted

## 2020-05-29 DIAGNOSIS — I714 Abdominal aortic aneurysm, without rupture, unspecified: Secondary | ICD-10-CM

## 2020-05-29 NOTE — Progress Notes (Deleted)
Referring Provider: Sharilyn Sites, MD Primary Care Physician:  Sharilyn Sites, MD Primary GI:  Dr. Gala Romney  No chief complaint on file.   HPI:   Kevin Frank is a 79 y.o. male who presents for post procedure follow-up and to evaluate for need of repeat colonoscopy.  Patient was last seen in our office 06/12/2019 for positive colorectal cancer screening using Cologuard.  Previous colonoscopy at that time in 2006 with multiple diminutive polyps and a large pedunculated polyps x 2 status post removal but no path available.  At his last visit he denied any significant GI complaints.  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 Recommended colonoscopy on propofol.  Colonoscopy completed 11/24/2019 which found a total of 17 sessile polyps in the rectum, mid rectum, sigmoid, descending colon, splenic flexure, hepatic flexure, and ileocecal valve.  These polyps ranged in size from 4 to 9 mm status post resection.  There is an additional 2 sessile polyps in the hepatic flexure that were 10 to 15 mm in size.  Scattered medium mouth diverticula in the entire colon.  Surgical pathology found the polyps to be a mix of tubular adenoma without high-grade dysplasia, sessile serrated polyp without cytologic dysplasia.  Recommended a repeat colonoscopy in 1 year to screen for any additional polyps.  Today he states he is doing okay overall.  Past Medical History:  Diagnosis Date  . AAA (abdominal aortic aneurysm) ([place]) 01/2008   3.2cm  . Allergy   . Arthritis   . Asthma   . BPH (benign prostatic hyperplasia)   . Chronic knee pain    bilateral  . COPD (chronic obstructive pulmonary disease) ([place])   . Diabetes mellitus    type II  . Gout   . History of kidney stones   . Hypercholesterolemia   . Hyperlipidemia   . Hypertension   . Lung cancer ([place]) 2019   Stage 1 squamous cell carcinoma right upper lobe  of lung   . Normal nuclear stress test 2011  . Obesity   . Obstructive sleep apnea on CPAP   . Presence of indwelling urinary catheter   . Shortness of breath    due to cigarette abuse  . Tobacco abuse     Past Surgical History:  Procedure Laterality Date  . ABDOMINAL AORTIC ENDOVASCULAR FENESTRATED STENT GRAFT N/A 04/09/2016   Procedure: ABDOMINAL AORTIC ENDOVASCULAR FENESTRATED STENT GRAFT with left brachial artery access using ultrasound guidance;  Surgeon: Serafina Mitchell, MD;  Location: [facility];  Service: Vascular;  Laterality: N/A;  . [place]   [place]  . COLONOSCOPY  2006   2006: multiple diminutive polyps in rectum, one pedunculated polyp at 10 cm s/p snare removal. Left-sided diverticulum, pedunculated polyps at 35 cm and splenic flexure s/p snare removal. No path available.   . COLONOSCOPY WITH PROPOFOL N/A 11/24/2019   Procedure: COLONOSCOPY WITH PROPOFOL;  Surgeon: Daneil Dolin, MD;  [facility];  Service: Endoscopy;  Laterality: N/A;  9:45am  . PERIPHERAL VASCULAR CATHETERIZATION N/A 04/10/2016   Procedure: Renal Angiography;  Surgeon: Angelia Mould, MD;  Location: [facility];  Service: Cardiovascular;  Laterality: N/A;  . POLYPECTOMY  11/24/2019   Procedure: POLYPECTOMY;  Surgeon: Daneil Dolin, MD;  [facility];  Service: Endoscopy;;  . TRANSURETHRAL RESECTION OF PROSTATE  01/07/2011   Procedure: TRANSURETHRAL RESECTION  OF THE PROSTATE (TURP);  Surgeon: Marissa Nestle;  Location: [facility];  Service: Urology;  Laterality: N/A;    Current Outpatient Medications  Medication Sig Dispense Refill  . albuterol (VENTOLIN HFA) 108 (90 Base) MCG/ACT inhaler Use 4 puffs every 4-6 hours as needed for cough or wheeze 18 g 2  . allopurinol (ZYLOPRIM) 300 MG tablet Take 1 tablet (300 mg total) by mouth daily. 30 tablet 1  . amLODipine (NORVASC) 10 MG tablet Take 1 tablet (10 mg total) by mouth daily. 14 tablet 0  . atorvastatin  (LIPITOR) 20 MG tablet Take 1 tablet (20 mg total) by mouth daily. 90 tablet 1  . clopidogrel (PLAVIX) 75 MG tablet Take 1 tablet (75 mg total) by mouth daily. 30 tablet 1  . Colchicine (MITIGARE) 0.6 MG CAPS Take 1 capsule by mouth in the morning and at bedtime. 60 capsule 5  . diclofenac Sodium (VOLTAREN) 1 % GEL Apply 4 g topically 4 (four) times daily. 4 g 5  . gabapentin (NEURONTIN) 100 MG capsule Take 1 capsule (100 mg total) by mouth 3 (three) times daily. 90 capsule 2  . glipiZIDE (GLUCOTROL XL) 5 MG 24 hr tablet Take 1 tablet (5 mg total) by mouth daily with breakfast. 30 tablet 3  . hydrALAZINE (APRESOLINE) 50 MG tablet TAKE (1) TABLET TWICE DAILY. 180 tablet 2  . losartan (COZAAR) 100 MG tablet TAKE 1 TABLET BY MOUTH ONCE DAILY. 30 tablet 0  . metFORMIN (GLUCOPHAGE) 500 MG tablet Take 1,000 mg by mouth 2 (two) times daily with a meal.     . methocarbamol (ROBAXIN) 500 MG tablet Take 1 tablet (500 mg total) by mouth 3 (three) times daily. 60 tablet 1  . metoprolol succinate (TOPROL-XL) 25 MG 24 hr tablet Take 1 tablet (25 mg total) by mouth daily. 14 tablet 0  . omalizumab (XOLAIR) 150 MG injection Inject 375 mg into the skin every 14 (fourteen) days. 6 each 11  . polyethylene glycol-electrolytes (TRILYTE) 420 g solution Take 4,000 mLs by mouth as directed. 4000 mL 0  . predniSONE (DELTASONE) 10 MG tablet Take 1 tablet (10 mg total) by mouth 3 (three) times daily. 42 tablet 0  . sertraline (ZOLOFT) 50 MG tablet Take 1 tablet (50 mg total) by mouth daily. 30 tablet 1  . torsemide (DEMADEX) 20 MG tablet Take 20 mg by mouth daily.    . TRELEGY ELLIPTA 100-62.5-25 MCG/INH AEPB INHALE 1 PUFF EVERY DAY 60 each 0   Current Facility-Administered Medications  Medication Dose Route Frequency Provider Last Rate Last Admin  . ipratropium (ATROVENT) nebulizer solution 0.5 mg  0.5 mg Nebulization Once Gean Quint, MD      . levalbuterol Penne Lash) nebulizer solution 1.25 mg  1.25 mg Nebulization  Once Gean Quint, MD      . omalizumab Arvid Right) injection 375 mg  375 mg Subcutaneous Q14 Days Valentina Shaggy, MD   375 mg at 05/16/20 1042    Allergies as of 05/29/2020 - Review Complete 05/21/2020  Allergen Reaction Noted  . No known allergies  04/08/2016    Family History  Problem Relation Age of Onset  . Liver disease Father   . Lymphoma Mother   . Diabetes Mother   . Hypertension Mother   . Cancer Mother   . Mental retardation Other        sibling -- from birth   . Asthma Daughter   . Allergic rhinitis Neg Hx   . Angioedema Neg Hx   .  Atopy Neg Hx   . Eczema Neg Hx   . Immunodeficiency Neg Hx   . Colon cancer Neg Hx     Social History   Socioeconomic History  . Marital status: Married    Spouse name: Not on file  . Number of children: Not on file  . Years of education: Not on file  . Highest education level: Not on file  Occupational History  . Not on file  Tobacco Use  . Smoking status: Current Every Day Smoker    Packs/day: 3.00    Years: 50.00    Pack years: 150.00    Types: Cigarettes  . Smokeless tobacco: Never Used  . Tobacco comment: 2ppd 10/30/2016 ee  Vaping Use  . Vaping Use: Never used  Substance and Sexual Activity  . Alcohol use: Yes    Alcohol/week: 0.0 standard drinks    Comment: very little  . Drug use: No  . Sexual activity: Not on file  Other Topics Concern  . Not on file  Social History Narrative  . Not on file   Social Determinants of Health   Financial Resource Strain:   . Difficulty of Paying Living Expenses: Not on file  Food Insecurity:   . Worried About Charity fundraiser in the Last Year: Not on file  . Ran Out of Food in the Last Year: Not on file  Transportation Needs:   . Lack of Transportation (Medical): Not on file  . Lack of Transportation (Non-Medical): Not on file  Physical Activity:   . Days of Exercise per Week: Not on file  . Minutes of Exercise per Session: Not on file  Stress:   . Feeling  of Stress : Not on file  Social Connections:   . Frequency of Communication with Friends and Family: Not on file  . Frequency of Social Gatherings with Friends and Family: Not on file  . Attends Religious Services: Not on file  . Active Member of Clubs or Organizations: Not on file  . Attends Archivist Meetings: Not on file  . Marital Status: Not on file    Subjective:*** Review of Systems  Constitutional: Negative for chills, fever, malaise/fatigue and weight loss.  HENT: Negative for congestion and sore throat.   Respiratory: Negative for cough and shortness of breath.   Cardiovascular: Negative for chest pain and palpitations.  Gastrointestinal: Negative for abdominal pain, blood in stool, diarrhea, melena, nausea and vomiting.  Musculoskeletal: Negative for joint pain and myalgias.  Skin: Negative for rash.  Neurological: Negative for dizziness and weakness.  Endo/Heme/Allergies: Does not bruise/bleed easily.  Psychiatric/Behavioral: Negative for depression. The patient is not nervous/anxious.   All other systems reviewed and are negative.    Objective: There were no vitals taken for this visit. Physical Exam Vitals and nursing note reviewed.  Constitutional:      General: He is not in acute distress.    Appearance: Normal appearance. He is not ill-appearing, toxic-appearing or diaphoretic.  HENT:     Head: Normocephalic and atraumatic.     Nose: No congestion or rhinorrhea.  Eyes:     General: No scleral icterus. Cardiovascular:     Rate and Rhythm: Normal rate and regular rhythm.     Heart sounds: Normal heart sounds.  Pulmonary:     Effort: Pulmonary effort is normal.     Breath sounds: Normal breath sounds.  Abdominal:     General: Bowel sounds are normal. There is no distension.  Palpations: Abdomen is soft. There is no hepatomegaly, splenomegaly or mass.     Tenderness: There is no abdominal tenderness. There is no guarding or rebound.      Hernia: No hernia is present.  Musculoskeletal:     Cervical back: Neck supple.  Skin:    General: Skin is warm and dry.     Coloration: Skin is not jaundiced.     Findings: No bruising or rash.  Neurological:     General: No focal deficit present.     Mental Status: He is alert and oriented to person, place, and time. Mental status is at baseline.  Psychiatric:        Mood and Affect: Mood normal.        Behavior: Behavior normal.        Thought Content: Thought content normal.      Assessment:  ***   Plan: ***    Thank you for allowing [place] to participate in the care of Halford Decamp, DNP, AGNP-C Adult & Gerontological Nurse Practitioner [facility]   05/29/2020 9:14 AM   Disclaimer: This note was dictated with voice recognition software. Similar sounding words can inadvertently be transcribed and may not be corrected upon review.

## 2020-05-30 ENCOUNTER — Ambulatory Visit (INDEPENDENT_AMBULATORY_CARE_PROVIDER_SITE_OTHER): Payer: Medicare HMO

## 2020-05-30 ENCOUNTER — Other Ambulatory Visit: Payer: Self-pay

## 2020-05-30 DIAGNOSIS — J4540 Moderate persistent asthma, uncomplicated: Secondary | ICD-10-CM

## 2020-05-30 NOTE — Telephone Encounter (Signed)
Patient came in requesting refill of his CPAP order. We are just going to start from scratch and refer to [facility] with a diagnosis of OSA.   Salvatore Marvel, MD Allergy and [employer] [facility] [place]

## 2020-05-31 NOTE — Telephone Encounter (Signed)
Referral placed to [facility]. Patients wife has been updated with this information.   Thanks

## 2020-06-01 ENCOUNTER — Other Ambulatory Visit: Payer: Self-pay

## 2020-06-01 ENCOUNTER — Ambulatory Visit: Payer: Medicare HMO | Admitting: Allergy & Immunology

## 2020-06-13 ENCOUNTER — Ambulatory Visit: Payer: Self-pay

## 2020-06-13 ENCOUNTER — Ambulatory Visit: Payer: Medicare HMO | Admitting: Family

## 2020-06-13 ENCOUNTER — Encounter: Payer: Self-pay | Admitting: Allergy & Immunology

## 2020-06-13 ENCOUNTER — Other Ambulatory Visit: Payer: Self-pay

## 2020-06-13 VITALS — BP 142/80 | HR 84 | Resp 19

## 2020-06-13 DIAGNOSIS — J449 Chronic obstructive pulmonary disease, unspecified: Secondary | ICD-10-CM | POA: Diagnosis not present

## 2020-06-13 DIAGNOSIS — J4540 Moderate persistent asthma, uncomplicated: Secondary | ICD-10-CM

## 2020-06-13 DIAGNOSIS — J3089 Other allergic rhinitis: Secondary | ICD-10-CM

## 2020-06-13 DIAGNOSIS — F17200 Nicotine dependence, unspecified, uncomplicated: Secondary | ICD-10-CM

## 2020-06-13 DIAGNOSIS — J302 Other seasonal allergic rhinitis: Secondary | ICD-10-CM | POA: Diagnosis not present

## 2020-06-13 MED ORDER — BREZTRI AEROSPHERE 160-9-4.8 MCG/ACT IN AERO: 2.0000 | INHALATION_SPRAY | Freq: Two times a day (BID) | RESPIRATORY_TRACT | 1 refills | Status: DC

## 2020-06-13 MED ORDER — ALBUTEROL SULFATE HFA 108 (90 BASE) MCG/ACT IN AERS: INHALATION_SPRAY | RESPIRATORY_TRACT | 2 refills | Status: DC

## 2020-06-13 NOTE — Patient Instructions (Addendum)
Asthma/COPD overlap Stop Trelegy 200/62.5/25  Start Breztri 160/9/4.8 mcg 2 puffs twice a day with spacer to help prevent cough and wheeze. Sample given Continue Xolair injections every 2 weeks May use albuterol 2 puffs every 4 hours as needed for cough, wheeze, tightness in chest, shortness of breath OR may use albuterol via nebulizer 1 ampoule every 4 hours as needed for cough, wheeze, tightness in chest, or shortness of breath. When you come in for your next Xolair injection finger COVID-19 vaccine card so we can figure out if it is time to receive your booster Asthma control goals:   Full participation in all desired activities (may need albuterol before activity)  Albuterol use two time or less a week on average (not counting use with activity)  Cough interfering with sleep two time or less a month  Oral steroids no more than once a year  No hospitalizations  Seasonal and perennial allergic rhinitis May use an antihistamine such as Claritin, Allegra, Xyzal, or Zyrtec once a day as needed for runny nose  Smoking Information given on smoking cessation  Continue to follow up with oncology  Please let us know if this treatment plan is not working well for you. Schedule follow-up appointment in 4 weeks

## 2020-06-13 NOTE — Progress Notes (Signed)
[address], [address] [place] [address] Dept: [phone]  FOLLOW UP NOTE  Patient ID: DIQUAN KASSIS, male    DOB: [birth year]  Age: 79 y.o. MRN: [id number] Date of Office Visit: 06/13/2020  Assessment  Chief Complaint: No chief complaint on file.  HPI SAMEER TEEPLE is a 79 year old male who presents today for follow-up of asthma/COPD overlap and seasonal and perennial allergic rhinitis.  He was last seen on November 30, 2019 by Dr. Ernst Bowler.  Asthma/COPD overlap is reported as moderately controlled with Trelegy 200/62.5/25 mcg 1 puff once a day, Xolair injections every 2 weeks, and albuterol as needed.  He reports productive cough with white sputum and wheezing at times and denies shortness of breath,tightness in his chest, and nocturnal awakenings.  Since his last office visit he has not required any trips to the emergency room or urgent care due to breathing problems.  He has been on 1 round of prednisone for arthritis/gout.  He reports very rare of his albuterol.  He has had 2 COVID-19 vaccines and is uncertain when he got his last vaccine. He continues to smoke and is smoking 2 packs/day.  He continues to follow-up with oncology and reports that he last saw them approximately 3 to 4 months ago.  Seasonal and perennial allergic rhinitis is reported moderately controlled with no medication.  He reports occasional postnasal drip and denies rhinorrhea, nasal congestion, and itchy watery eyes.  Drug Allergies:  Allergies  Allergen Reactions   No Known Allergies     Review of Systems: Review of Systems  Constitutional: Negative for chills and fever.  HENT:       Reports postnasal drip and denies rhinorrhea and nasal congestion  Eyes:       Denies itchy watery eyes  Respiratory: Positive for cough, sputum production, shortness of breath and wheezing.        Reports productive cough with clear sputum and wheezing at times.  Denies shortness of breath, tightness in chest,  and nocturnal awakenings.  Cardiovascular: Negative for chest pain and palpitations.  Gastrointestinal: Negative for abdominal pain and heartburn.  Genitourinary: Negative for dysuria.  Skin: Positive for itching. Negative for rash.       Reports itching due to dry skin  Neurological: Negative for headaches.  Endo/Heme/Allergies: Positive for environmental allergies.    Physical Exam: BP (!) 142/80    Pulse 84    Resp 19    SpO2 92%    Physical Exam Constitutional:      Appearance: Normal appearance.  HENT:     Head: Normocephalic and atraumatic.     Comments: Pharynx normal, eyes normal, ears normal, nose normal    Right Ear: Tympanic membrane, ear canal and external ear normal.     Left Ear: Tympanic membrane, ear canal and external ear normal.     Nose: Nose normal.     Mouth/Throat:     Mouth: Mucous membranes are moist.     Pharynx: Oropharynx is clear.  Eyes:     Conjunctiva/sclera: Conjunctivae normal.  Cardiovascular:     Rate and Rhythm: Regular rhythm.     Heart sounds: Normal heart sounds.  Pulmonary:     Effort: Pulmonary effort is normal.     Breath sounds: Normal breath sounds.     Comments: Lungs clear to auscultation Musculoskeletal:     Cervical back: Neck supple.  Neurological:     Mental Status: He is alert and oriented to person, place,  and time.  Psychiatric:        Mood and Affect: Mood normal.        Behavior: Behavior normal.        Thought Content: Thought content normal.        Judgment: Judgment normal.     Diagnostics: FVC 1.86 L, FEV1 1.36 L.  Predicted FVC 3.89 L, FEV1 2.77 L.  Spirometry indicates severe restriction.  Status post bronchodilator response shows FVC 1.84 L, FEV1 1.49 L.  Spirometry indicates severe restriction with a 10% change in FEV1.  Assessment and Plan: 1. Moderate persistent asthma without complication   2. Asthma-COPD overlap syndrome ([place])   3. Seasonal and perennial allergic rhinitis   4. Current smoker      Meds ordered this encounter  Medications   Budeson-Glycopyrrol-Formoterol (BREZTRI AEROSPHERE) 160-9-4.8 MCG/ACT AERO    Sig: Inhale 2 puffs into the lungs 2 (two) times daily.    Dispense:  10.7 g    Refill:  1   albuterol (VENTOLIN HFA) 108 (90 Base) MCG/ACT inhaler    Sig: Use 4 puffs every 4-6 hours as needed for cough or wheeze    Dispense:  18 g    Refill:  2    Patient Instructions  Asthma/COPD overlap Stop Trelegy 200/62.5/25  Start Breztri 160/9/4.8 mcg 2 puffs twice a day with spacer to help prevent cough and wheeze. Sample given Continue Xolair injections every 2 weeks May use albuterol 2 puffs every 4 hours as needed for cough, wheeze, tightness in chest, shortness of breath OR may use albuterol via nebulizer 1 ampoule every 4 hours as needed for cough, wheeze, tightness in chest, or shortness of breath. When you come in for your next Xolair injection finger COVID-19 vaccine card so we can figure out if it is time to receive your booster Asthma control goals:   Full participation in all desired activities (may need albuterol before activity)  Albuterol use two time or less a week on average (not counting use with activity)  Cough interfering with sleep two time or less a month  Oral steroids no more than once a year  No hospitalizations  Seasonal and perennial allergic rhinitis May use an antihistamine such as Claritin, Allegra, Xyzal, or Zyrtec once a day as needed for runny nose  Smoking Information given on smoking cessation  Continue to follow up with oncology  Please let us know if this treatment plan is not working well for you. Schedule follow-up appointment in 4 weeks   Return in about 4 weeks (around 07/11/2020), or if symptoms worsen or fail to improve.    Thank you for the opportunity to care for this patient.  Please do not hesitate to contact me with questions.  Althea Charon, FNP Allergy and [place] [facility] [place]

## 2020-06-14 NOTE — Telephone Encounter (Signed)
Patient is scheduled for a sleep consult on 07/18/20 @ 10 with Dr Brett Fairy. Patient states he will bring his CPAP machine to that visit for them to check.

## 2020-06-14 NOTE — Telephone Encounter (Signed)
Great! Thank you!   Salvatore Marvel, MD Allergy and [place] [facility] [place]

## 2020-06-19 ENCOUNTER — Ambulatory Visit
Admission: RE | Admit: 2020-06-19 | Discharge: 2020-06-19 | Disposition: A | Discharge status: Home / Self Care | Payer: Medicare HMO | Source: Ambulatory Visit | Attending: Surgery | Admitting: Surgery

## 2020-06-19 DIAGNOSIS — K429 Umbilical hernia without obstruction or gangrene: Secondary | ICD-10-CM | POA: Diagnosis not present

## 2020-06-19 DIAGNOSIS — I714 Abdominal aortic aneurysm, without rupture, unspecified: Secondary | ICD-10-CM

## 2020-06-19 DIAGNOSIS — I70203 Unspecified atherosclerosis of native arteries of extremities, bilateral legs: Secondary | ICD-10-CM | POA: Diagnosis not present

## 2020-06-19 DIAGNOSIS — K4090 Unilateral inguinal hernia, without obstruction or gangrene, not specified as recurrent: Secondary | ICD-10-CM | POA: Diagnosis not present

## 2020-06-19 MED ORDER — IOPAMIDOL (ISOVUE-370) INJECTION 76%
75.0000 mL | Freq: Once | INTRAVENOUS | Status: AC | PRN
  Administered 2020-06-19: 75 mL via INTRAVENOUS

## 2020-06-21 ENCOUNTER — Other Ambulatory Visit: Payer: Self-pay

## 2020-06-21 ENCOUNTER — Encounter: Payer: Self-pay | Admitting: Orthopedic Surgery

## 2020-06-21 ENCOUNTER — Ambulatory Visit (INDEPENDENT_AMBULATORY_CARE_PROVIDER_SITE_OTHER): Payer: Medicare HMO | Admitting: Orthopedic Surgery

## 2020-06-21 VITALS — BP 155/77 | HR 86 | Ht 70.0 in

## 2020-06-21 DIAGNOSIS — G8929 Other chronic pain: Secondary | ICD-10-CM

## 2020-06-21 DIAGNOSIS — M25512 Pain in left shoulder: Secondary | ICD-10-CM

## 2020-06-21 NOTE — Progress Notes (Signed)
Chief Complaint  Patient presents with  . Shoulder Pain    Left shoulder pain, wants injection    Procedure note the subacromial injection shoulder left   Verbal consent was obtained to inject the  Left   Shoulder  Timeout was completed to confirm the injection site is a subacromial space of the  left  shoulder  Medication used Depo-Medrol 40 mg and lidocaine 1% 3 cc  Anesthesia was provided by ethyl chloride  The injection was performed in the left  posterior subacromial space. After pinning the skin with alcohol and anesthetized the skin with ethyl chloride the subacromial space was injected using a 20-gauge needle. There were no complications  Sterile dressing was applied.        Encounter Diagnosis  Name Primary?  . Chronic left shoulder pain Yes

## 2020-06-26 ENCOUNTER — Telehealth: Payer: Self-pay | Admitting: Allergy & Immunology

## 2020-06-26 NOTE — Telephone Encounter (Signed)
Patient wife call and need to have his sleep study done in . Not in [place] because he does not drive at night. [phone].

## 2020-06-27 ENCOUNTER — Other Ambulatory Visit: Payer: Self-pay

## 2020-06-27 ENCOUNTER — Ambulatory Visit
Admission: EM | Admit: 2020-06-27 | Discharge: 2020-06-27 | Disposition: A | Discharge status: Home / Self Care | Payer: Medicare HMO | Attending: Emergency Medicine | Admitting: Emergency Medicine

## 2020-06-27 DIAGNOSIS — Z20822 Contact with and (suspected) exposure to covid-19: Secondary | ICD-10-CM

## 2020-06-27 DIAGNOSIS — M1A09X0 Idiopathic chronic gout, multiple sites, without tophus (tophi): Secondary | ICD-10-CM | POA: Diagnosis not present

## 2020-06-27 DIAGNOSIS — E669 Obesity, unspecified: Secondary | ICD-10-CM | POA: Diagnosis not present

## 2020-06-27 DIAGNOSIS — Z6832 Body mass index (BMI) 32.0-32.9, adult: Secondary | ICD-10-CM | POA: Diagnosis not present

## 2020-06-27 DIAGNOSIS — M2550 Pain in unspecified joint: Secondary | ICD-10-CM | POA: Diagnosis not present

## 2020-06-27 DIAGNOSIS — M25512 Pain in left shoulder: Secondary | ICD-10-CM | POA: Diagnosis not present

## 2020-06-27 NOTE — ED Triage Notes (Signed)
Pt presents for covid test for exposure. Denies any symptoms. Exposure x 3 days ago.

## 2020-06-27 NOTE — Telephone Encounter (Signed)
Thanks, Dee!!   Salvatore Marvel, MD Allergy and [place] [facility] [place]

## 2020-06-27 NOTE — Telephone Encounter (Signed)
I called Dr Merlene Laughter next door to [place] @ [facility] to see if they do sleep consults. The coordinator didn't answer so I left a voicemail.  Once I hear back from them regarding this referral, I will give the patient a call back.

## 2020-06-29 ENCOUNTER — Ambulatory Visit (INDEPENDENT_AMBULATORY_CARE_PROVIDER_SITE_OTHER): Payer: Medicare HMO

## 2020-06-29 DIAGNOSIS — J4540 Moderate persistent asthma, uncomplicated: Secondary | ICD-10-CM | POA: Diagnosis not present

## 2020-06-29 LAB — SARS-COV-2, NAA 2 DAY TAT

## 2020-06-29 LAB — NOVEL CORONAVIRUS, NAA: SARS-CoV-2, NAA: NOT DETECTED

## 2020-06-29 NOTE — Telephone Encounter (Signed)
Referral has been faxed to Dr Merlene Laughter for review. Patients wife has been informed.

## 2020-07-02 ENCOUNTER — Encounter: Payer: Self-pay | Admitting: Surgery

## 2020-07-02 ENCOUNTER — Other Ambulatory Visit: Payer: Self-pay

## 2020-07-02 ENCOUNTER — Ambulatory Visit: Payer: Medicare HMO | Admitting: Surgery

## 2020-07-02 VITALS — BP 119/71 | HR 92 | Temp 98.2°F | Resp 20 | Ht 70.0 in | Wt 226.0 lb

## 2020-07-02 DIAGNOSIS — I714 Abdominal aortic aneurysm, without rupture, unspecified: Secondary | ICD-10-CM

## 2020-07-02 NOTE — Progress Notes (Signed)
[facility] [place]  Patient name: Kevin Frank MRN: [id number] DOB: 1940/08/17 Sex: male   REASON FOR VISIT:    Follow up  [name]:    Kevin Frank is a 80 y.o. 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  The day after his procedure, his creatinine bumped and ultrasound did not show good perfusion to the unstented left kidney.  Therefore he went to the Cath Lab and have the left renal artery stented.  His creatinine trended down following that. He is back today for follow up.  He has no complaints.  He is medically managed for type 2 diabetes as well as hypertension.  He takes a statin for hypercholesterolemia.  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 AAA (abdominal aortic aneurysm) ([place]) 01/2008   3.2cm  . Allergy   . Arthritis   . Asthma   . BPH (benign prostatic hyperplasia)   . Chronic knee pain    bilateral  . COPD (chronic obstructive pulmonary disease) ([place])   . Diabetes mellitus    type II  . Gout   . History of kidney stones   . Hypercholesterolemia   . Hyperlipidemia   . Hypertension   . 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 Normal nuclear stress test 2011  . Obesity   . Obstructive sleep apnea on CPAP   . Presence of indwelling urinary catheter   . Shortness of breath    due to cigarette abuse  . Tobacco abuse      FAMILY HISTORY:   Family History  Problem Relation Age of Onset  . Liver disease Father   . Lymphoma Mother   . Diabetes Mother   . Hypertension Mother   . Cancer Mother   . Mental retardation Other        sibling -- from birth   . Asthma Daughter   . Allergic rhinitis Neg Hx   . Angioedema Neg Hx   . Atopy Neg Hx   . Eczema Neg Hx   . Immunodeficiency Neg Hx   . Colon cancer  Neg Hx     SOCIAL HISTORY:   Social History   Tobacco Use  . Smoking status: Current Every Day Smoker    Packs/day: 3.00    Years: 50.00    Pack years: 150.00    Types: Cigarettes  . Smokeless tobacco: Never Used  . Tobacco comment: 2ppd 10/30/2016 ee  Substance Use Topics  . Alcohol use: Yes    Alcohol/week: 0.0 standard drinks    Comment: very little     ALLERGIES:   Allergies  Allergen Reactions  . No Known Allergies      CURRENT MEDICATIONS:   Current Outpatient Medications  Medication Sig Dispense Refill  . albuterol (VENTOLIN HFA) 108 (90 Base) MCG/ACT inhaler Use 4 puffs every 4-6 hours as needed for cough or wheeze 18 g 2  . allopurinol (ZYLOPRIM) 300 MG tablet Take 1 tablet (300 mg total) by mouth daily. 30 tablet 1  . amLODipine (NORVASC) 10 MG tablet Take 1 tablet (10 mg total) by mouth daily. 14 tablet 0  . atorvastatin (LIPITOR) 20 MG tablet Take 1 tablet (20 mg total) by mouth daily. 90 tablet 1  . Budeson-Glycopyrrol-Formoterol (BREZTRI AEROSPHERE) 160-9-4.8 MCG/ACT  AERO Inhale 2 puffs into the lungs 2 (two) times daily. 10.7 g 1  . clopidogrel (PLAVIX) 75 MG tablet Take 1 tablet (75 mg total) by mouth daily. 30 tablet 1  . Colchicine (MITIGARE) 0.6 MG CAPS Take 1 capsule by mouth in the morning and at bedtime. 60 capsule 5  . diclofenac Sodium (VOLTAREN) 1 % GEL Apply 4 g topically 4 (four) times daily. 4 g 5  . gabapentin (NEURONTIN) 100 MG capsule Take 1 capsule (100 mg total) by mouth 3 (three) times daily. 90 capsule 2  . glipiZIDE (GLUCOTROL XL) 5 MG 24 hr tablet Take 1 tablet (5 mg total) by mouth daily with breakfast. 30 tablet 3  . hydrALAZINE (APRESOLINE) 50 MG tablet TAKE (1) TABLET TWICE DAILY. 180 tablet 2  . losartan (COZAAR) 100 MG tablet TAKE 1 TABLET BY MOUTH ONCE DAILY. 30 tablet 0  . metFORMIN (GLUCOPHAGE) 500 MG tablet Take 1,000 mg by mouth 2 (two) times daily with a meal.     . methocarbamol (ROBAXIN) 500 MG tablet Take 1 tablet (500  mg total) by mouth 3 (three) times daily. 60 tablet 1  . metoprolol succinate (TOPROL-XL) 25 MG 24 hr tablet Take 1 tablet (25 mg total) by mouth daily. 14 tablet 0  . omalizumab (XOLAIR) 150 MG injection Inject 375 mg into the skin every 14 (fourteen) days. 6 each 11  . polyethylene glycol-electrolytes (TRILYTE) 420 g solution Take 4,000 mLs by mouth as directed. 4000 mL 0  . predniSONE (DELTASONE) 10 MG tablet Take 1 tablet (10 mg total) by mouth 3 (three) times daily. 42 tablet 0  . sertraline (ZOLOFT) 50 MG tablet Take 1 tablet (50 mg total) by mouth daily. 30 tablet 1  . torsemide (DEMADEX) 20 MG tablet Take 20 mg by mouth daily.    . TRELEGY ELLIPTA 100-62.5-25 MCG/INH AEPB INHALE 1 PUFF EVERY DAY 60 each 0   Current Facility-Administered Medications  Medication Dose Route Frequency Provider Last Rate Last Admin  . ipratropium (ATROVENT) nebulizer solution 0.5 mg  0.5 mg Nebulization Once Gean Quint, MD      . levalbuterol Penne Lash) nebulizer solution 1.25 mg  1.25 mg Nebulization Once Gean Quint, MD      . omalizumab Arvid Right) injection 375 mg  375 mg Subcutaneous Q14 Days Valentina Shaggy, MD   375 mg at 06/29/20 1529    REVIEW OF SYSTEMS:   [X]  denotes positive finding, [ ]  denotes negative finding Cardiac  Comments:  Chest pain or chest pressure:    Shortness of breath upon exertion:    Short of breath when lying flat:    Irregular heart rhythm:        Vascular    Pain in calf, thigh, or hip brought on by ambulation:    Pain in feet at night that wakes you up from your sleep:     Blood clot in your veins:    Leg swelling:         Pulmonary    Oxygen at home:    Productive cough:     Wheezing:         Neurologic    Sudden weakness in arms or legs:     Sudden numbness in arms or legs:     Sudden onset of difficulty speaking or slurred speech:    Temporary loss of vision in one eye:     Problems with dizziness:         Gastrointestinal  Blood in  stool:     Vomited blood:         Genitourinary    Burning when urinating:     Blood in urine:        Psychiatric    Major depression:         Hematologic    Bleeding problems:    Problems with blood clotting too easily:        Skin    Rashes or ulcers:        Constitutional    Fever or chills:      PHYSICAL EXAM:   Vitals:   07/02/20 0947  BP: 119/71  Pulse: 92  Resp: 20  Temp: 98.2 F (36.8 C)  SpO2: 92%  Weight: 226 lb (102.5 kg)  Height: 5\' 10"  (1.778 m)    GENERAL: The patient is a well-nourished male, in no acute distress. The vital signs are documented above. CARDIAC: There is a regular rate and rhythm.  PULMONARY: Non-labored respirations ABDOMEN: Soft and non-tender  MUSCULOSKELETAL: There are no major deformities or cyanosis. NEUROLOGIC: No focal weakness or paresthesias are detected. SKIN: There are no ulcers or rashes noted. PSYCHIATRIC: The patient has a normal affect.  STUDIES:   I have reviewed the CT scan with the following findings: Redemonstration of prior fenestrated endovascular repair of juxtarenal abdominal aortic aneurysm with cook device. There has been adequate positive remodeling of the excluded aneurysm sac with no evidence of endoleak or other late complication.  MEDICAL ISSUES:   AAA:  Maximum diameter on CTA I 3.1 cm.  There is no evidence of endoleak.  Both renal stents are widely patent.  I have him scheduled for follow-up with abdominal ultrasound and renal ultrasound in 1 year.    Leia Alf, MD, FACS [facility] [phone] Pager [phone]

## 2020-07-06 ENCOUNTER — Encounter: Payer: Self-pay | Admitting: Allergy & Immunology

## 2020-07-06 ENCOUNTER — Other Ambulatory Visit: Payer: Self-pay

## 2020-07-06 ENCOUNTER — Ambulatory Visit (INDEPENDENT_AMBULATORY_CARE_PROVIDER_SITE_OTHER): Payer: Medicare HMO | Admitting: Allergy & Immunology

## 2020-07-06 VITALS — BP 122/78 | HR 86 | Resp 20

## 2020-07-06 DIAGNOSIS — J3089 Other allergic rhinitis: Secondary | ICD-10-CM

## 2020-07-06 DIAGNOSIS — J302 Other seasonal allergic rhinitis: Secondary | ICD-10-CM | POA: Diagnosis not present

## 2020-07-06 DIAGNOSIS — F17200 Nicotine dependence, unspecified, uncomplicated: Secondary | ICD-10-CM | POA: Diagnosis not present

## 2020-07-06 DIAGNOSIS — J449 Chronic obstructive pulmonary disease, unspecified: Secondary | ICD-10-CM | POA: Diagnosis not present

## 2020-07-06 NOTE — Patient Instructions (Signed)
Asthma/COPD overlap Lung testing looks slightly better than last time. Start a prednisone pack provided. We will get another spirometry next time you come in for Xolair to see if we can get your numbers even better.  Breztri 160/9/4.8 mcg 2 puffs twice a day with spacer to help prevent cough and wheeze. Sample given Continue Xolair injections every 2 weeks May use albuterol 2 puffs every 4 hours as needed for cough, wheeze, tightness in chest, shortness of breath OR may use albuterol via nebulizer 1 ampoule every 4 hours as needed for cough, wheeze, tightness in chest, or shortness of breath. When you come in for your next Xolair injection finger COVID-19 vaccine card so we can figure out if it is time to receive your booster Asthma control goals:   Full participation in all desired activities (may need albuterol before activity)  Albuterol use two time or less a week on average (not counting use with activity)  Cough interfering with sleep two time or less a month  Oral steroids no more than once a year  No hospitalizations  Seasonal and perennial allergic rhinitis May use an antihistamine such as Claritin, Allegra, Xyzal, or Zyrtec once a day as needed for runny nose  Follow up in 3 months for a regular office visit.

## 2020-07-06 NOTE — Progress Notes (Signed)
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 Start a prednisone pack provided. We will get another spirometry next time you come in for Xolair to see if we can get your numbers even better.  Breztri 160/9/4.8 mcg 2 puffs twice a day with spacer to help prevent cough and wheeze. Sample given Continue Xolair injections every 2 weeks May use albuterol 2 puffs every 4 hours as needed for cough, wheeze, tightness in chest, shortness of breath OR may use albuterol via nebulizer 1 ampoule every 4 hours as needed for cough, wheeze, tightness in chest, or shortness of breath. When you come in for your next Xolair injection finger COVID-19 vaccine card so we can figure out if it is time to receive your booster Asthma control goals:   Full participation in all desired activities (may need albuterol before activity)  Albuterol use two time or less a week on average (not counting use with activity)  Cough interfering with sleep two time or less a month  Oral steroids no more than once a year  No hospitalizations  Seasonal and perennial allergic rhinitis May use an antihistamine such as Claritin, Allegra, Xyzal, or Zyrtec once a day as needed for runny nose  Follow up in 3 months for a regular office visit.   Subjective:   Kevin Frank is a 80 y.o. male presenting today for follow up of  Chief Complaint  Patient presents with  . Asthma    Doing well.  New inhaler is working well for him.     Kevin Frank has a history of the following: Patient Active Problem List   Diagnosis Date Noted  . Wrist pain, acute, right 09/07/2019  . Right forearm pain 09/07/2019  . Uncontrolled type 2 diabetes mellitus with hyperglycemia ([place]) 06/27/2019  . Mixed hyperlipidemia 06/27/2019  . Class 1 obesity due to excess calories with serious comorbidity and body mass index (BMI) of 32.0 to 32.9 in adult 06/27/2019  . Diabetes mellitus without complication ([place]) 28/31/5176  . Positive colorectal cancer screening using Cologuard test 06/14/2019  . Squamous cell carcinoma of right lung ([place]) 03/29/2018  . Seasonal and perennial allergic rhinitis 09/22/2017  . Asthma-COPD overlap syndrome ([place]) 09/22/2017  . Obstructive sleep apnea on CPAP 04/23/2017  . Chronic knee pain 04/23/2017  . AKI (acute kidney injury) ([place]) 04/23/2017  . Chronic diastolic CHF (congestive heart failure) ([place]) 04/23/2017  . Cellulitis 04/23/2017  . Cellulitis of left lower leg   . Current smoker 03/05/2016  . Incidental lung nodule, > 41mm and < 49mm 03/05/2016  . Stage 2 moderate COPD by GOLD classification ([place]) 03/05/2016  . AAA (abdominal aortic aneurysm) without rupture ([place]) 06/14/2015  . OBSTRUCTIVE SLEEP APNEA 06/09/2007  . HYPERCHOLESTEROLEMIA 05/06/2007  . GOUT 05/06/2007  . DEPRESSION 05/06/2007  . Essential hypertension, benign 05/06/2007  . ABDOMINAL AORTIC ANEURYSM 05/06/2007  . BENIGN PROSTATIC HYPERTROPHY, HX OF 05/06/2007    History obtained from: chart review and patient.  Kevin Frank is a 80 y.o. male presenting for a follow up visit. He was  last seen in December 2021. At that time, we changed him from Trelegy to [facility] to see if this was more effective in controlling his spirometric numbers. We also continued with the Xolair every two weeks.   Since the last visit, he has done well. He does report that he is having a lot of arm pain. He had an injection a few  weeks ago.  He has an appointment at the end of the month, but appears to be in quite a bit of pain today.  Asthma/Respiratory Symptom History: He is using the Breztri 2 puffs twice daily.  He does normally use a spacer.  He has noticed an improvement when he uses this medication, but he will forget doses.  He has been up-to-date on his Xolair.  He is getting it every 2 weeks.  He has not required any prednisone.  He has not been using any antibiotics.  Allergic Rhinitis Symptom History: Remains on nasal sprays as needed.  He does not use anything on a routine basis.  He has not needed antibiotics at all since the last visit.  He is having a party at his house at the end of January.  He is very forceful and his desire for calm.  I did tell him that we have been encouraged strongly by [place] to avoid large crowds and regarding work in order to help decrease the spread of the omicron variant.  Otherwise, there have been no changes to his past medical history, surgical history, family history, or social history.    Review of Systems  Constitutional: Negative.  Negative for chills, fever, malaise/fatigue and weight loss.  HENT: Positive for congestion and sinus pain. Negative for ear discharge and ear pain.   Eyes: Negative for pain, discharge and redness.  Respiratory: Positive for cough and shortness of breath. Negative for sputum production and wheezing.   Cardiovascular: Negative.  Negative for chest pain and palpitations.  Gastrointestinal: Negative for abdominal pain, constipation, diarrhea, heartburn, nausea and vomiting.  Skin: Negative.  Negative for itching and rash.  Neurological: Negative for dizziness and headaches.  Endo/Heme/Allergies: Positive for environmental allergies. Does not bruise/bleed easily.       Objective:   Blood pressure 122/78, pulse 86, resp. rate 20, SpO2 (!) 86 %. There is no height or weight on file to calculate BMI.   Physical Exam:  Physical  Exam Constitutional:      Appearance: He is well-developed.     Comments: Smiling.  Hard of hearing.  HENT:     Head: Normocephalic and atraumatic.     Right Ear: Tympanic membrane, ear canal and external ear normal.     Left Ear: Tympanic membrane, ear canal and external ear normal.     Nose: No nasal deformity, septal deviation, mucosal edema, rhinorrhea or epistaxis.     Right Turbinates: Enlarged and swollen.     Left Turbinates: Enlarged and swollen.     Right Sinus: No maxillary sinus tenderness or frontal sinus tenderness.     Left Sinus: No maxillary sinus tenderness or frontal sinus tenderness.     Comments: No nasal polyps.    Mouth/Throat:     Mouth: Oropharynx is clear and moist. Mucous membranes are not pale and not dry.     Pharynx: Uvula midline.  Eyes:     General:        Right eye: No discharge.        Left eye: No discharge.     Extraocular  Movements: EOM normal.     Conjunctiva/sclera: Conjunctivae normal.     Right eye: Right conjunctiva is not injected. No chemosis.    Left eye: Left conjunctiva is not injected. No chemosis.    Pupils: Pupils are equal, round, and reactive to light.  Cardiovascular:     Rate and Rhythm: Normal rate and regular rhythm.     Heart sounds: Normal heart sounds.  Pulmonary:     Effort: Pulmonary effort is normal. No tachypnea, accessory muscle usage or respiratory distress.     Breath sounds: Normal breath sounds. No wheezing, rhonchi or rales.     Comments: Decreased air movement at the bases.  No wheezing. Chest:     Chest wall: No tenderness.  Lymphadenopathy:     Cervical: No cervical adenopathy.  Skin:    General: Skin is warm.     Capillary Refill: Capillary refill takes less than 2 seconds.     Coloration: Skin is not pale.     Findings: No abrasion, erythema, petechiae or rash. Rash is not papular, urticarial or vesicular.  Neurological:     Mental Status: He is alert.  Psychiatric:        Mood and Affect: Mood and  affect normal.        Behavior: Behavior is cooperative.      Diagnostic studies:    Spirometry: results abnormal (FEV1: 1.92/47%, FVC: 1.92/47%, FEV1/FVC: 72%) exists.    Spirometry consistent with possible restrictive disease.  Overall, values are slightly better than they were at the last visit.  Of note, the last she used spirometry readings are the first since after he had part of his lung removed for lung cancer.  Allergy Studies: none       Salvatore Marvel, MD  [facility] [place]

## 2020-07-09 ENCOUNTER — Other Ambulatory Visit: Payer: Self-pay | Admitting: *Deleted

## 2020-07-09 MED ORDER — OMALIZUMAB 75 MG/0.5ML ~~LOC~~ SOSY: 75.0000 mg | PREFILLED_SYRINGE | SUBCUTANEOUS | 11 refills | Status: DC

## 2020-07-09 MED ORDER — OMALIZUMAB 150 MG/ML ~~LOC~~ SOSY: 375.0000 mg | PREFILLED_SYRINGE | SUBCUTANEOUS | 11 refills | Status: DC

## 2020-07-11 ENCOUNTER — Ambulatory Visit: Payer: Medicare HMO | Admitting: Allergy & Immunology

## 2020-07-12 ENCOUNTER — Other Ambulatory Visit: Payer: Self-pay

## 2020-07-12 ENCOUNTER — Ambulatory Visit (INDEPENDENT_AMBULATORY_CARE_PROVIDER_SITE_OTHER): Payer: Medicare HMO | Admitting: Orthopedic Surgery

## 2020-07-12 VITALS — Ht 70.0 in | Wt 226.0 lb

## 2020-07-12 DIAGNOSIS — M25512 Pain in left shoulder: Secondary | ICD-10-CM

## 2020-07-12 DIAGNOSIS — G8929 Other chronic pain: Secondary | ICD-10-CM

## 2020-07-12 NOTE — Patient Instructions (Signed)
Joint Steroid Injection A joint steroid injection is a procedure to relieve swelling and pain in a joint. Steroids are medicines that reduce inflammation. In this procedure, your health care provider uses a syringe and a needle to inject a steroid medicine into a painful and inflamed joint. A pain-relieving medicine (anesthetic) may be injected along with the steroid. In some cases, your health care provider may use an imaging technique such as ultrasound or fluoroscopy to guide the injection. Joints that are often treated with steroid injections include the knee, shoulder, hip, and spine. These injections may also be used in the elbow, ankle, and joints of the hands or feet. You may have joint steroid injections as part of your treatment for inflammation caused by:  Gout.  Rheumatoid arthritis.  Advanced wear-and-tear arthritis (osteoarthritis).  Tendinitis.  Bursitis. Joint steroid injections may be repeated, but having them too often can damage a joint or the skin over the joint. You should not have joint steroid injections less than 6 weeks apart or more than four times a year. Tell a health care provider about:  Any allergies you have.  All medicines you are taking, including vitamins, herbs, eye drops, creams, and over-the-counter medicines.  Any problems you or family members have had with anesthetic medicines.  Any blood disorders you have.  Any surgeries you have had.  Any medical conditions you have.  Whether you are pregnant or may be pregnant. What are the risks? Generally, this is a safe treatment. However, problems may occur, including:  Infection.  Bleeding.  Allergic reactions to medicines.  Damage to the joint or tissues around the joint.  Thinning of skin or loss of skin color over the joint.  Temporary flushing of the face or chest.  Temporary increase in pain.  Temporary increase in blood sugar.  Failure to relieve inflammation or pain. What  happens before the treatment? Medicines Ask your health care provider about:  Changing or stopping your regular medicines. This is especially important if you are taking diabetes medicines or blood thinners.  Taking medicines such as aspirin and ibuprofen. These medicines can thin your blood. Do not take these medicines unless your health care provider tells you to take them.  Taking over-the-counter medicines, vitamins, herbs, and supplements. General instructions  You may have imaging tests of your joint.  Ask your health care provider if you can drive yourself home after the procedure. What happens during the treatment?  Your health care provider will position you for the injection and locate the injection site over your joint.  The skin over the joint will be cleaned with a germ-killing soap.  Your health care provider may: ? Spray a numbing solution (topical anesthetic) over the injection site. ? Inject a local anesthetic under the skin above your joint.  The needle will be placed through your skin into your joint. Your health care provider may use imaging to guide the needle to the right spot for the injection. If imaging is used, a special contrast dye may be injected to confirm that the needle is in the correct location.  The steroid medicine will be injected into your joint.  Anesthetic may be injected along with the steroid. This may be a medicine that relieves pain for a short time (short-acting anesthetic) or for a longer time (long-acting anesthetic).  The needle will be removed, and an adhesive bandage (dressing) will be placed over the injection site. The procedure may vary among health care providers and hospitals.  What can I expect after the treatment?  You will be able to go home after the treatment.  It is normal to feel slight flushing for a few days after the injection.  After the treatment, it is common to have an increase in joint pain after the  anesthetic has worn off. This may happen about an hour after a short-acting anesthetic or about 8 hours after a longer-acting anesthetic.  You should begin to feel relief from joint pain and swelling after 24 to 48 hours. Contact your health care provider if you do not begin to feel relief after 2 days. Follow these instructions at home: Injection site care  Leave the adhesive dressing over your injection site in place until your health care provider says you can remove it.  Check your injection site every day for signs of infection. Check for: ? More redness, swelling, or pain. ? Fluid or blood. ? Warmth. ? Pus or a bad smell. Activity  Return to your normal activities as told by your health care provider. Ask your health care provider what activities are safe for you. You may be asked to limit activities that put stress on the joint for a few days.  Do joint exercises as told by your health care provider.  Do not take baths, swim, or use a hot tub until your health care provider approves. Ask your health care provider if you may take showers. You may only be allowed to take sponge baths. Managing pain, stiffness, and swelling  If directed, put ice on the joint. To do this: ? Put ice in a plastic bag. ? Place a towel between your skin and the bag. ? Leave the ice on for 20 minutes, 2-3 times a day. ? Remove the ice if your skin turns bright red. This is very important. If you cannot feel pain, heat, or cold, you have a greater risk of damage to the area.  Raise (elevate) your joint above the level of your heart when you are sitting or lying down.   General instructions  Take over-the-counter and prescription medicines only as told by your health care provider.  Do not use any products that contain nicotine or tobacco, such as cigarettes, e-cigarettes, and chewing tobacco. These can delay joint healing. If you need help quitting, ask your health care provider.  If you have  diabetes, be aware that your blood sugar may be slightly elevated for several days after the injection.  Keep all follow-up visits. This is important. Contact a health care provider if you have:  Chills or a fever.  Any signs of infection at your injection site.  Increased pain or swelling or no relief after 2 days. Summary  A joint steroid injection is a treatment to relieve pain and swelling in a joint.  Steroids are medicines that reduce inflammation. Your health care provider may add an anesthetic along with the steroid.  You may have joint steroid injections as part of your arthritis treatment.  Joint steroid injections may be repeated, but having them too often can damage a joint or the skin over the joint.  Contact your health care provider if you have a fever, chills, or signs of infection, or if you get no relief from joint pain or swelling. This information is not intended to replace advice given to you by your health care provider. Make sure you discuss any questions you have with your health care provider. Document Revised: 11/18/2019 Document Reviewed: 11/18/2019 Elsevier Patient Education  [place].

## 2020-07-12 NOTE — Progress Notes (Signed)
Chief Complaint  Patient presents with  . Follow-up    Left shoulder pain    Chronic left shoulder pain patient requested injection  Procedure note the subacromial injection shoulder left   Verbal consent was obtained to inject the  Left   Shoulder  Timeout was completed to confirm the injection site is a subacromial space of the  left  shoulder  Medication used Depo-Medrol 40 mg and lidocaine 1% 3 cc  Anesthesia was provided by ethyl chloride  The injection was performed in the left  posterior subacromial space. After pinning the skin with alcohol and anesthetized the skin with ethyl chloride the subacromial space was injected using a 20-gauge needle. There were no complications  Sterile dressing was applied.  Return as needed

## 2020-07-13 ENCOUNTER — Ambulatory Visit (INDEPENDENT_AMBULATORY_CARE_PROVIDER_SITE_OTHER): Payer: Medicare HMO

## 2020-07-13 DIAGNOSIS — J4540 Moderate persistent asthma, uncomplicated: Secondary | ICD-10-CM | POA: Diagnosis not present

## 2020-07-18 ENCOUNTER — Institutional Professional Consult (permissible substitution): Payer: Medicare HMO | Admitting: Neurology

## 2020-07-23 ENCOUNTER — Ambulatory Visit: Payer: Medicare HMO | Admitting: Orthopedic Surgery

## 2020-07-27 ENCOUNTER — Ambulatory Visit (INDEPENDENT_AMBULATORY_CARE_PROVIDER_SITE_OTHER): Payer: Medicare HMO

## 2020-07-27 ENCOUNTER — Other Ambulatory Visit: Payer: Self-pay

## 2020-07-27 ENCOUNTER — Other Ambulatory Visit (HOSPITAL_COMMUNITY): Payer: Self-pay | Admitting: Internal Medicine

## 2020-07-27 DIAGNOSIS — M549 Dorsalgia, unspecified: Secondary | ICD-10-CM

## 2020-07-27 DIAGNOSIS — J4540 Moderate persistent asthma, uncomplicated: Secondary | ICD-10-CM

## 2020-07-27 DIAGNOSIS — M47816 Spondylosis without myelopathy or radiculopathy, lumbar region: Secondary | ICD-10-CM | POA: Diagnosis not present

## 2020-07-27 DIAGNOSIS — E6609 Other obesity due to excess calories: Secondary | ICD-10-CM | POA: Diagnosis not present

## 2020-07-27 DIAGNOSIS — Z6831 Body mass index (BMI) 31.0-31.9, adult: Secondary | ICD-10-CM | POA: Diagnosis not present

## 2020-07-27 DIAGNOSIS — I10 Essential (primary) hypertension: Secondary | ICD-10-CM | POA: Diagnosis not present

## 2020-08-10 ENCOUNTER — Ambulatory Visit (INDEPENDENT_AMBULATORY_CARE_PROVIDER_SITE_OTHER): Payer: Medicare HMO

## 2020-08-10 ENCOUNTER — Other Ambulatory Visit: Payer: Self-pay

## 2020-08-10 ENCOUNTER — Ambulatory Visit: Payer: Self-pay

## 2020-08-10 DIAGNOSIS — J4540 Moderate persistent asthma, uncomplicated: Secondary | ICD-10-CM | POA: Diagnosis not present

## 2020-08-15 ENCOUNTER — Ambulatory Visit: Payer: Medicare HMO | Admitting: Cardiovascular Disease

## 2020-08-15 NOTE — Progress Notes (Deleted)
Cardiology Office Note:    Date:  08/15/2020   ID:  RANDOLF SANSOUCIE, DOB 1940/10/16, MRN [id number]  PCP:  Sharilyn Sites, [place]  Cardiologist:  Sherren Mocha, MD  Advanced Practice Provider:  No care team member to display Electrophysiologist:  None  {Press F2 to show EP APP, CHF, sleep or structural heart MD               :[id number]}  { Click here to update then REFRESH NOTE - MD (PCP) or APP (Team Member)  Change PCP Type for MD, Specialty for APP is either Cardiology or Clinical Cardiac Electrophysiology  :[id number]}   Referring MD: Sharilyn Sites, MD   No chief complaint on file.   History of Present Illness:    Kevin Frank is a 80 y.o. male with a hx of hypertension, mixed hyperlipidemia, type 2 diabetes, longstanding tobacco abuse with COPD, and abdominal aortic aneurysm.  The patient has undergone FEVARwith a single rightrenal fenestration by Dr. Trula Slade in 2017.  The patient has evidence of left main and three-vessel coronary artery disease incidentally noted by calcification of the coronary arteries on CT scan.  He has never been diagnosed with ischemic heart disease from a clinical perspective and has no history of angina, revascularization, or heart attack. In 2019 the patient was diagnosed with lung cancer treated with palliative XRT as he was felt to be a poor candidate for surgical intervention.    Past Medical History:  Diagnosis Date  . AAA (abdominal aortic aneurysm) ([place]) 01/2008   3.2cm  . Allergy   . Arthritis   . Asthma   . BPH (benign prostatic hyperplasia)   . Chronic knee pain    bilateral  . COPD (chronic obstructive pulmonary disease) ([place])   . Diabetes mellitus    type II  . Gout   . History of kidney stones   . Hypercholesterolemia   . Hyperlipidemia   . Hypertension   . 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 Normal nuclear stress test 2011  . Obesity   .  Obstructive sleep apnea on CPAP   . Presence of indwelling urinary catheter   . Shortness of breath    due to cigarette abuse  . Tobacco abuse     Past Surgical History:  Procedure Laterality Date  . ABDOMINAL AORTIC ENDOVASCULAR FENESTRATED STENT GRAFT N/A 04/09/2016   Procedure: ABDOMINAL AORTIC ENDOVASCULAR FENESTRATED STENT GRAFT with left brachial artery access using ultrasound guidance;  Surgeon: Serafina Mitchell, MD;  Location: [facility];  Service: Vascular;  Laterality: N/A;  . [place]   [place]  . COLONOSCOPY  2006   2006: multiple diminutive polyps in rectum, one pedunculated polyp at 10 cm s/p snare removal. Left-sided diverticulum, pedunculated polyps at 35 cm and splenic flexure s/p snare removal. No path available.   . COLONOSCOPY WITH PROPOFOL N/A 11/24/2019   Procedure: COLONOSCOPY WITH PROPOFOL;  Surgeon: Daneil Dolin, MD;  [facility];  Service: Endoscopy;  Laterality: N/A;  9:45am  . PERIPHERAL VASCULAR CATHETERIZATION N/A 04/10/2016   Procedure: Renal Angiography;  Surgeon: Angelia Mould, MD;  Location: [facility];  Service: Cardiovascular;  Laterality: N/A;  . POLYPECTOMY  11/24/2019   Procedure: POLYPECTOMY;  Surgeon: Daneil Dolin, MD;  [facility];  Service: Endoscopy;;  . TRANSURETHRAL RESECTION OF PROSTATE  01/07/2011  Procedure: TRANSURETHRAL RESECTION OF THE PROSTATE (TURP);  Surgeon: Marissa Nestle;  Location: [facility];  Service: Urology;  Laterality: N/A;    Current Medications: No outpatient medications have been marked as taking for the 08/15/20 encounter (Appointment) with Sherren Mocha, MD.   Current Facility-Administered Medications for the 08/15/20 encounter (Appointment) with Sherren Mocha, MD  Medication  . ipratropium (ATROVENT) nebulizer solution 0.5 mg  . levalbuterol (XOPENEX) nebulizer solution 1.25 mg  . omalizumab Arvid Right) injection 375 mg     Allergies:   No known allergies   Social  History   Socioeconomic History  . Marital status: Married    Spouse name: Not on file  . Number of children: Not on file  . Years of education: Not on file  . Highest education level: Not on file  Occupational History  . Not on file  Tobacco Use  . Smoking status: Current Every Day Smoker    Packs/day: 3.00    Years: 50.00    Pack years: 150.00    Types: Cigarettes  . Smokeless tobacco: Never Used  . Tobacco comment: 2ppd 10/30/2016 ee  Vaping Use  . Vaping Use: Never used  Substance and Sexual Activity  . Alcohol use: Yes    Alcohol/week: 0.0 standard drinks    Comment: very little  . Drug use: No  . Sexual activity: Not on file  Other Topics Concern  . Not on file  Social History Narrative  . Not on file   Social Determinants of Health   Financial Resource Strain: Not on file  Food Insecurity: Not on file  Transportation Needs: Not on file  Physical Activity: Not on file  Stress: Not on file  Social Connections: Not on file     Family History: The patient's ***family history includes Asthma in his daughter; Cancer in his mother; Diabetes in his mother; Hypertension in his mother; Liver disease in his father; Lymphoma in his mother; Mental retardation in an other family member. There is no history of Allergic rhinitis, Angioedema, Atopy, Eczema, Immunodeficiency, or Colon cancer.  ROS:   Please see the history of present illness.    *** All other systems reviewed and are negative.  EKGs/Labs/Other Studies Reviewed:    The following studies were reviewed today: ***  EKG:  EKG is *** ordered today.  The ekg ordered today demonstrates ***  Recent Labs: 11/22/2019: ALT 21; BUN 14; Hemoglobin 15.4; Platelets 205; Potassium 4.4; Sodium 139 04/20/2020: Creatinine, Ser 0.90  Recent Lipid Panel    Component Value Date/Time   CHOL 117 03/23/2019 0000   TRIG 89 03/23/2019 0000   HDL 39 03/23/2019 0000   CHOLHDL 3 12/16/2010 0854   VLDL 10.6 12/16/2010 0854    LDLCALC 61 03/23/2019 0000     Risk Assessment/Calculations:   {Does this patient have ATRIAL FIBRILLATION?[phone]}   Physical Exam:    VS:  There were no vitals taken for this visit.    Wt Readings from Last 3 Encounters:  07/12/20 226 lb (102.5 kg)  07/02/20 226 lb (102.5 kg)  05/21/20 235 lb (106.6 kg)     GEN: *** Well nourished, well developed in no acute distress HEENT: Normal NECK: No JVD; No carotid bruits LYMPHATICS: No lymphadenopathy CARDIAC: ***RRR, no murmurs, rubs, gallops RESPIRATORY:  Clear to auscultation without rales, wheezing or rhonchi  ABDOMEN: Soft, non-tender, non-distended MUSCULOSKELETAL:  No edema; No deformity  SKIN: Warm and dry NEUROLOGIC:  Alert and oriented x 3 PSYCHIATRIC:  Normal affect  ASSESSMENT:    1. Hypertension, essential   2. Current smoker   3. AAA (abdominal aortic aneurysm) without rupture ([place])   4. Mixed hyperlipidemia    PLAN:    In order of problems listed above:  1. ***   {Are you ordering a CV Procedure (e.g. stress test, cath, DCCV, TEE, etc)?   Press F2        :[id number]}    Medication Adjustments/Labs and Tests Ordered: Current medicines are reviewed at length with the patient today.  Concerns regarding medicines are outlined above.  No orders of the defined types were placed in this encounter.  No orders of the defined types were placed in this encounter.   There are no Patient Instructions on file for this visit.   Signed, Sherren Mocha, MD  08/15/2020 8:32 AM    [facility]

## 2020-08-24 ENCOUNTER — Other Ambulatory Visit: Payer: Self-pay | Admitting: Orthopedic Surgery

## 2020-08-24 DIAGNOSIS — G5622 Lesion of ulnar nerve, left upper limb: Secondary | ICD-10-CM

## 2020-08-24 DIAGNOSIS — M25522 Pain in left elbow: Secondary | ICD-10-CM

## 2020-08-24 NOTE — Telephone Encounter (Signed)
Patient came by and requested a prescription for Prednisone be sent in to [facility] for him

## 2020-08-27 MED ORDER — PREDNISONE 10 MG PO TABS: 10.0000 mg | ORAL_TABLET | Freq: Three times a day (TID) | ORAL | 0 refills | Status: DC

## 2020-08-29 ENCOUNTER — Ambulatory Visit (INDEPENDENT_AMBULATORY_CARE_PROVIDER_SITE_OTHER): Payer: Medicare HMO

## 2020-08-29 ENCOUNTER — Other Ambulatory Visit: Payer: Self-pay

## 2020-08-29 DIAGNOSIS — J4540 Moderate persistent asthma, uncomplicated: Secondary | ICD-10-CM | POA: Diagnosis not present

## 2020-09-10 DIAGNOSIS — L570 Actinic keratosis: Secondary | ICD-10-CM | POA: Diagnosis not present

## 2020-09-14 ENCOUNTER — Other Ambulatory Visit: Payer: Self-pay

## 2020-09-14 ENCOUNTER — Ambulatory Visit (INDEPENDENT_AMBULATORY_CARE_PROVIDER_SITE_OTHER): Payer: Medicare HMO

## 2020-09-14 DIAGNOSIS — J4540 Moderate persistent asthma, uncomplicated: Secondary | ICD-10-CM | POA: Diagnosis not present

## 2020-09-18 ENCOUNTER — Other Ambulatory Visit: Payer: Self-pay

## 2020-09-18 MED ORDER — BREZTRI AEROSPHERE 160-9-4.8 MCG/ACT IN AERO: 2.0000 | INHALATION_SPRAY | Freq: Two times a day (BID) | RESPIRATORY_TRACT | 1 refills | Status: DC

## 2020-09-28 ENCOUNTER — Other Ambulatory Visit: Payer: Self-pay

## 2020-09-28 ENCOUNTER — Ambulatory Visit (INDEPENDENT_AMBULATORY_CARE_PROVIDER_SITE_OTHER): Payer: Medicare HMO

## 2020-09-28 DIAGNOSIS — J4540 Moderate persistent asthma, uncomplicated: Secondary | ICD-10-CM

## 2020-10-03 ENCOUNTER — Other Ambulatory Visit: Payer: Self-pay

## 2020-10-03 ENCOUNTER — Encounter: Payer: Self-pay | Admitting: Physician Assistant

## 2020-10-03 ENCOUNTER — Ambulatory Visit: Payer: Medicare HMO | Admitting: Physician Assistant

## 2020-10-03 VITALS — BP 140/76 | HR 64 | Ht 70.0 in | Wt 227.6 lb

## 2020-10-03 DIAGNOSIS — I10 Essential (primary) hypertension: Secondary | ICD-10-CM

## 2020-10-03 DIAGNOSIS — I714 Abdominal aortic aneurysm, without rupture, unspecified: Secondary | ICD-10-CM

## 2020-10-03 DIAGNOSIS — J449 Chronic obstructive pulmonary disease, unspecified: Secondary | ICD-10-CM

## 2020-10-03 DIAGNOSIS — C3490 Malignant neoplasm of unspecified part of unspecified bronchus or lung: Secondary | ICD-10-CM

## 2020-10-03 DIAGNOSIS — E7800 Pure hypercholesterolemia, unspecified: Secondary | ICD-10-CM

## 2020-10-03 DIAGNOSIS — I2510 Atherosclerotic heart disease of native coronary artery without angina pectoris: Secondary | ICD-10-CM | POA: Diagnosis not present

## 2020-10-03 DIAGNOSIS — I5032 Chronic diastolic (congestive) heart failure: Secondary | ICD-10-CM | POA: Diagnosis not present

## 2020-10-03 DIAGNOSIS — I2584 Coronary atherosclerosis due to calcified coronary lesion: Secondary | ICD-10-CM | POA: Diagnosis not present

## 2020-10-03 NOTE — Progress Notes (Signed)
Cardiology Office Note:    Date:  10/03/2020   ID:  [name], DOB Nov 07, 1940, MRN [id number]  PCP:  Sharilyn Sites, MD   [place]  Cardiologist:  Sherren Mocha, MD   Advanced Practice Provider:  Liliane Shi, PA-C Electrophysiologist:  None       Referring MD: Sharilyn Sites, MD   Chief Complaint:  Follow-up (Coronary Ca2+, CHF)    Patient Profile:     Kevin CARSTARPHEN is a 80 y.o. male with:   Coronary artery disease   Coronary Ca2+ on prior CT  (HFpEF) heart failure with preserved ejection fraction   Hypertension   Hyperlipidemia   Diabetes mellitus   Tobacco abuse   COPD  Lung CA  Poor candidate for surgery; s/p XRT  Abdominal aortic aneurysm   S/p FEVAR w/ a single R Renal fenestration (Dr. Trula Slade)  Required L renal artery angioplasty to restore flow post op in setting of AKI  Osteoarthritis   Prior CV studies: Echocardiogram 11/15/19 Mild LVH, Gr 1 DD, EF 60-65, no RWMA, normal RVSF, trivial MR, AV sclerosis, no AS, mild dilation of ascending aorta (41 mm)  GATED SPECT MYO PERF W/LEXISCAN STRESS 1D 10/26/2019 Narrative  Nuclear stress EF: 60%. The left ventricular ejection fraction is normal (55-65%).  This is a low risk study. There is no evidence of ischemia and no evidence of previous myocardial infarction.  The study is normal.     History of Present Illness:    Mr. Hinners was last seen by Truitt Merle, NP in 8/21.  He returns for f/u.  He is here with his daughter.  He continues to smoke.  His PCP has recommended using O2.  He has declined.  He has shortness of breath with light to mod activity.  He has not had chest pain, syncope, orthopnea, leg edema.  He admits to eating a lot of salt.         Past Medical History:  Diagnosis Date  . AAA (abdominal aortic aneurysm) ([place]) 01/2008   3.2cm  . Allergy   . Arthritis   . Asthma   . BPH (benign prostatic hyperplasia)   . Chronic knee pain     bilateral  . COPD (chronic obstructive pulmonary disease) ([place])   . Diabetes mellitus    type II  . Gout   . History of kidney stones   . Hypercholesterolemia   . Hyperlipidemia   . Hypertension   . 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 Normal nuclear stress test 2011  . Obesity   . Obstructive sleep apnea on CPAP   . Presence of indwelling urinary catheter   . Shortness of breath    due to cigarette abuse  . Tobacco abuse     Current Medications: Current Meds  Medication Sig  . albuterol (VENTOLIN HFA) 108 (90 Base) MCG/ACT inhaler Use 4 puffs every 4-6 hours as needed for cough or wheeze  . allopurinol (ZYLOPRIM) 300 MG tablet Take 1 tablet (300 mg total) by mouth daily.  [name] amLODipine (NORVASC) 10 MG tablet Take 1 tablet (10 mg total) by mouth daily.  [name] atorvastatin (LIPITOR) 20 MG tablet Take 1 tablet (20 mg total) by mouth daily.  . Budeson-Glycopyrrol-Formoterol (BREZTRI AEROSPHERE) 160-9-4.8 MCG/ACT AERO Inhale 2 puffs into the lungs 2 (two) times daily.  . clopidogrel (PLAVIX) 75 MG tablet Take 1 tablet (75 mg total) by  mouth daily.  . hydrALAZINE (APRESOLINE) 50 MG tablet TAKE (1) TABLET TWICE DAILY.  [name] losartan (COZAAR) 100 MG tablet TAKE 1 TABLET BY MOUTH ONCE DAILY.  . metFORMIN (GLUCOPHAGE) 500 MG tablet Take 1,000 mg by mouth 2 (two) times daily with a meal.   . metoprolol succinate (TOPROL-XL) 25 MG 24 hr tablet Take 1 tablet (25 mg total) by mouth daily.  . polyethylene glycol-electrolytes (TRILYTE) 420 g solution Take 4,000 mLs by mouth as directed.  . sertraline (ZOLOFT) 50 MG tablet Take 1 tablet (50 mg total) by mouth daily.  [name] torsemide (DEMADEX) 20 MG tablet Take 20 mg by mouth daily.   Current Facility-Administered Medications for the 10/03/20 encounter (Office Visit) with Richardson Dopp T, PA-C  Medication  . ipratropium (ATROVENT) nebulizer solution 0.5 mg  . levalbuterol (XOPENEX) nebulizer solution 1.25 mg   . omalizumab Arvid Right) injection 375 mg     Allergies:   No known allergies   Social History   Tobacco Use  . Smoking status: Current Every Day Smoker    Packs/day: 3.00    Years: 50.00    Pack years: 150.00    Types: Cigarettes  . Smokeless tobacco: Never Used  . Tobacco comment: 2ppd 10/30/2016 ee  Vaping Use  . Vaping Use: Never used  Substance Use Topics  . Alcohol use: Yes    Alcohol/week: 0.0 standard drinks    Comment: very little  . Drug use: No     Family Hx: The patient's family history includes Asthma in his daughter; Cancer in his mother; Diabetes in his mother; Hypertension in his mother; Liver disease in his father; Lymphoma in his mother; Mental retardation in an other family member. There is no history of Allergic rhinitis, Angioedema, Atopy, Eczema, Immunodeficiency, or Colon cancer.  ROS   EKGs/Labs/Other Test Reviewed:    EKG:  EKG is   ordered today.  The ekg ordered today demonstrates normal sinus rhythm, HR 64, LAD, inc RBBB, PAC, non-specific ST-TW changes, QTc 439 ms, no changes   Recent Labs: 11/22/2019: ALT 21; BUN 14; Hemoglobin 15.4; Platelets 205; Potassium 4.4; Sodium 139 04/20/2020: Creatinine, Ser 0.90   Recent Lipid Panel Lab Results  Component Value Date/Time   CHOL 117 03/23/2019 12:00 AM   TRIG 89 03/23/2019 12:00 AM   HDL 39 03/23/2019 12:00 AM   CHOLHDL 3 12/16/2010 08:54 AM   LDLCALC 61 03/23/2019 12:00 AM   Labs obtained through [facility] - personally reviewed and interpreted: 12/19/2019: Total cholesterol 114, HDL 32, LDL 64, triglycerides 92, K+ 3.7 04/05/2020: A1c 6.9 05/23/2020: Hgb 16.8, creatinine 1.0, ALT 26  Risk Assessment/Calculations:      Physical Exam:    VS:  BP 140/76   Pulse 64   Ht 5\' 10"  (1.778 m)   Wt 227 lb 9.6 oz (103.2 kg)   SpO2 91%   BMI 32.66 kg/m     Wt Readings from Last 3 Encounters:  10/03/20 227 lb 9.6 oz (103.2 kg)  07/12/20 226 lb (102.5 kg)  07/02/20 226 lb (102.5 kg)      Constitutional:      Appearance: Healthy appearance. Not in distress.  Neck:     Thyroid: No thyromegaly.     Vascular: JVD normal.  Pulmonary:     Effort: Pulmonary effort is normal.     Breath sounds: No wheezing. No rales.  Cardiovascular:     Normal rate. Regular rhythm. Normal S1. Normal S2.     Murmurs: There is no  murmur.  Abdominal:     Palpations: Abdomen is soft. There is no hepatomegaly.  Skin:    General: Skin is warm and dry.  Neurological:     General: No focal deficit present.     Mental Status: Alert and oriented to person, place and time.     Cranial Nerves: Cranial nerves are intact.          ASSESSMENT & PLAN:    1. Coronary artery calcification Chronic calcification noted on prior chest CT.  He has chronic shortness of breath related to COPD from ongoing tobacco abuse as well as chronic heart failure.  He has not had symptoms to suggest angina.  Electrocardiogram does not demonstrate any ischemic changes.  Continue current dose of clopidogrel, atorvastatin, metoprolol succinate.  2. Chronic heart failure with preserved ejection fraction ([place]) EF 60-65 by echocardiogram in 5/21.  NYHA III.  Volume status stable.  Continue current dose of torsemide.  We discussed the importance of daily weights and when to take extra torsemide.  We also discussed the importance of limiting salt in his diet.  3. AAA (abdominal aortic aneurysm) without rupture ([facility]) History of endovascular repair.  He is followed by Dr. Trula Slade.  4. Stage 2 moderate COPD by GOLD classification ([place]) I have encouraged him to follow-up with primary care to reconsider oxygen therapy.  I have also recommended that he quit smoking.  We discussed the increased risk of stroke, heart attack, recurrent malignancy.  5. Malignant neoplasm of lung, unspecified laterality, unspecified part of lung [facility]) He has undergone radiation therapy in the past.  He is followed by Dr. Delton Coomb[facility] at [facility].    6. Essential hypertension Blood pressures at home are fairly optimal.  We discussed the importance of limiting salt.  Continue current dose of amlodipine, hydralazine, losartan, metoprolol succinate  7. Pure hypercholesterolemia LDL optimal on most recent lab work.  Continue current Rx.       Dispo:  Return in about 6 months (around 04/04/2021) for Routine 6 month follow up with Richardson Dopp, PA..   Medication Adjustments/Labs and Tests Ordered: Current medicines are reviewed at length with the patient today.  Concerns regarding medicines are outlined above.  Tests Ordered: Orders Placed This Encounter  Procedures  . EKG 12-Lead   Medication Changes: No orders of the defined types were placed in this encounter.   Signed, Richardson Dopp, PA-C  10/03/2020 2:40 PM    [facility] [address], [place], [place]  [address] Phone: [phone]; Fax: [phone]

## 2020-10-03 NOTE — Patient Instructions (Signed)
Medication Instructions:  Your physician recommends that you continue on your current medications as directed. Please refer to the Current Medication list given to you today.   *If you need a refill on your cardiac medications before your next appointment, please call your pharmacy*   Lab Work: -None  If you have labs (blood work) drawn today and your tests are completely normal, you will receive your results only by: [name] MyChart Message (if you have MyChart) OR . A paper copy in the mail If you have any lab test that is abnormal or we need to change your treatment, we will call you to review the results.   Testing/Procedures: -None   Follow-Up: At [facility], you and your health needs are our priority.  As part of our continuing mission to provide you with exceptional heart care, we have created designated Provider Care Teams.  These Care Teams include your primary Cardiologist (physician) and Advanced Practice Providers (APPs -  Physician Assistants and Nurse Practitioners) who all work together to provide you with the care you need, when you need it.  We recommend signing up for the patient portal called "MyChart".  Sign up information is provided on this After Visit Summary.  MyChart is used to connect with patients for Virtual Visits (Telemedicine).  Patients are able to view lab/test results, encounter notes, upcoming appointments, etc.  Non-urgent messages can be sent to your provider as well.   To learn more about what you can do with MyChart, go to [email or web address].    Your next appointment:   6 month(s)  The format for your next appointment:   In Person  Provider:   Richardson Dopp, PA-C   Other Instructions Your physician wants you to follow-up in: 6 month with Richardson Dopp, PA.  You will receive a reminder letter in the mail two months in advance. If you don't receive a letter, please call our office to schedule the follow-up appointment.

## 2020-10-12 ENCOUNTER — Other Ambulatory Visit: Payer: Self-pay

## 2020-10-12 ENCOUNTER — Ambulatory Visit (INDEPENDENT_AMBULATORY_CARE_PROVIDER_SITE_OTHER): Payer: Medicare HMO | Admitting: *Deleted

## 2020-10-12 DIAGNOSIS — J4540 Moderate persistent asthma, uncomplicated: Secondary | ICD-10-CM | POA: Diagnosis not present

## 2020-10-19 ENCOUNTER — Encounter: Payer: Self-pay | Admitting: Allergy & Immunology

## 2020-10-19 ENCOUNTER — Other Ambulatory Visit: Payer: Self-pay

## 2020-10-19 ENCOUNTER — Ambulatory Visit: Payer: Medicare HMO | Admitting: Family Medicine

## 2020-10-19 VITALS — BP 152/64 | HR 66 | Temp 98.3°F | Resp 16

## 2020-10-19 DIAGNOSIS — J302 Other seasonal allergic rhinitis: Secondary | ICD-10-CM | POA: Diagnosis not present

## 2020-10-19 DIAGNOSIS — J449 Chronic obstructive pulmonary disease, unspecified: Secondary | ICD-10-CM | POA: Diagnosis not present

## 2020-10-19 DIAGNOSIS — F17200 Nicotine dependence, unspecified, uncomplicated: Secondary | ICD-10-CM | POA: Diagnosis not present

## 2020-10-19 DIAGNOSIS — J3089 Other allergic rhinitis: Secondary | ICD-10-CM | POA: Diagnosis not present

## 2020-10-19 DIAGNOSIS — J4540 Moderate persistent asthma, uncomplicated: Secondary | ICD-10-CM | POA: Diagnosis not present

## 2020-10-19 MED ORDER — EPINEPHRINE 0.3 MG/0.3ML IJ SOAJ: 0.3000 mg | Freq: Once | INTRAMUSCULAR | 2 refills | Status: AC

## 2020-10-19 NOTE — Progress Notes (Signed)
[place], [address] [place] [address] Dept: [phone]  FOLLOW UP NOTE  Patient ID: ARLISS FRISINA, male    DOB: 05/06/1941  Age: 80 y.o. MRN: [id number] Date of Office Visit: 10/19/2020  Assessment  Chief Complaint: Follow-up  HPI MANVEER GOMES is a 80 year old male who presents to the clinic for a follow up visit. He was last seen in this clinic on 07/06/2020 by Dr. Ernst Bowler for evaluation of asthma/COPD overlap syndrome, allergic rhinitis, tobacco use, lung cancer, and restrictive pattern on spirometry. At today's visit, he reports his asthma has been moderately well controlled with occasional shortness of breath, continuous wheeze, and cough in the mornings producing thick yellow drainage.  He continues Breztri 2 puffs twice a day with his spacer and uses albuterol about once every couple weeks with relief of symptoms. He continues Xolair injections once every 2 weeks with no local reactions. He reports a slight improvement in his symptoms of asthma while continuing on Xolair injections.  He continues to smoke 3 packs of cigarettes a day.  Allergic rhinitis is reported as moderately well controlled with occasional nasal congestion and sneeze.  He is not currently taking an antihistamine, using nasal steroid spray, or using nasal saline rinses.  His current medications are listed in the chart.   Drug Allergies:  Allergies  Allergen Reactions  . No Known Allergies     Physical Exam: BP (!) 152/64 (BP Location: Left Arm, Patient Position: Sitting, Cuff Size: Normal)   Pulse 66   Temp 98.3 F (36.8 C) (Temporal)   Resp 16   SpO2 93%    Physical Exam Vitals reviewed.  Constitutional:      Appearance: Normal appearance.  HENT:     Head: Normocephalic and atraumatic.     Right Ear: Tympanic membrane normal.     Left Ear: Tympanic membrane normal.     Nose:     Comments: Bilateral nares slightly erythematous with clear nasal drainage noted. Pharynx normal.  Ears normal. Eyes normal.    Mouth/Throat:     Pharynx: Oropharynx is clear.  Eyes:     Conjunctiva/sclera: Conjunctivae normal.  Cardiovascular:     Rate and Rhythm: Normal rate and regular rhythm.     Heart sounds: Normal heart sounds. No murmur heard.   Pulmonary:     Effort: Pulmonary effort is normal.     Breath sounds: Normal breath sounds.     Comments: Lungs clear to auscultation Musculoskeletal:        General: Normal range of motion.     Cervical back: Normal range of motion and neck supple.  Skin:    General: Skin is warm and dry.  Neurological:     Mental Status: He is alert and oriented to person, place, and time.  Psychiatric:        Mood and Affect: Mood normal.        Behavior: Behavior normal.        Thought Content: Thought content normal.        Judgment: Judgment normal.     Diagnostics: FVC 2.28, FEV1 1.39. Predicted FVC 3.90, FEV1 2.88. Spirometry indicates severe restriction and obstruction.   Assessment and Plan: 1. Moderate persistent asthma without complication   2. Current smoker   3. Seasonal and perennial allergic rhinitis   4. Asthma-COPD overlap syndrome ([place])     Meds ordered this encounter  Medications  . EPINEPHrine (EPIPEN 2-PAK) 0.3 mg/0.3 mL IJ SOAJ injection  Sig: Inject 0.3 mg into the muscle once for 1 dose.    Dispense:  1 each    Refill:  2    Patient Instructions  Asthma/COPD overlap Continue Breztri 160/9/4.8 mcg 2 puffs twice a day with spacer to help prevent cough and wheeze. Sample given Continue Xolair injections once every 2 weeks Continue albuterol 2 puffs every 4 hours as needed for cough, wheeze, tightness in chest, shortness of breath OR may use albuterol via nebulizer 1 ampoule every 4 hours as needed for cough, wheeze, tightness in chest, or shortness of breath. When you come in for your next Xolair injection bring your COVID-19 vaccine card so we can figure out if it is time to receive your booster Asthma  control goals:   Full participation in all desired activities (may need albuterol before activity)  Albuterol use two time or less a week on average (not counting use with activity)  Cough interfering with sleep two time or less a month  Oral steroids no more than once a year  No hospitalizations  Seasonal and perennial allergic rhinitis May use an antihistamine such as Claritin, Allegra, Xyzal, or Zyrtec once a day as needed for runny nose Consider saline nasal rinses as needed for nasal symptoms. Use this before any medicated nasal sprays for best result  Smoking Try to cut down on smoking or best to quit smoking  Continue to follow up with oncology  Call the clinic if this treatment plan is not working well for you  Follow up in 3 months or sooner if needed.    Return in about 3 months (around 01/18/2021), or if symptoms worsen or fail to improve.    Thank you for the opportunity to care for this patient.  Please do not hesitate to contact me with questions.  Gareth Morgan, FNP [facility]

## 2020-10-19 NOTE — Patient Instructions (Addendum)
Asthma/COPD overlap Continue Breztri 160/9/4.8 mcg 2 puffs twice a day with spacer to help prevent cough and wheeze. Sample given Continue Xolair injections once every 2 weeks Continue albuterol 2 puffs every 4 hours as needed for cough, wheeze, tightness in chest, shortness of breath OR may use albuterol via nebulizer 1 ampoule every 4 hours as needed for cough, wheeze, tightness in chest, or shortness of breath. When you come in for your next Xolair injection bring your COVID-19 vaccine card so we can figure out if it is time to receive your booster Asthma control goals:   Full participation in all desired activities (may need albuterol before activity)  Albuterol use two time or less a week on average (not counting use with activity)  Cough interfering with sleep two time or less a month  Oral steroids no more than once a year  No hospitalizations  Seasonal and perennial allergic rhinitis May use an antihistamine such as Claritin, Allegra, Xyzal, or Zyrtec once a day as needed for runny nose Consider saline nasal rinses as needed for nasal symptoms. Use this before any medicated nasal sprays for best result  Smoking Try to cut down on smoking or best to quit smoking  Continue to follow up with oncology  Call the clinic if this treatment plan is not working well for you  Follow up in 3 months or sooner if needed.

## 2020-10-24 ENCOUNTER — Inpatient Hospital Stay (HOSPITAL_COMMUNITY): Payer: Medicare HMO | Attending: Hematology

## 2020-10-24 ENCOUNTER — Other Ambulatory Visit: Payer: Self-pay

## 2020-10-24 ENCOUNTER — Ambulatory Visit (HOSPITAL_COMMUNITY)
Admission: RE | Admit: 2020-10-24 | Discharge: 2020-10-24 | Disposition: A | Discharge status: Home / Self Care | Payer: Medicare HMO | Source: Ambulatory Visit | Attending: Hematology | Admitting: Hematology

## 2020-10-24 DIAGNOSIS — C3411 Malignant neoplasm of upper lobe, right bronchus or lung: Secondary | ICD-10-CM | POA: Diagnosis not present

## 2020-10-24 DIAGNOSIS — J432 Centrilobular emphysema: Secondary | ICD-10-CM | POA: Diagnosis not present

## 2020-10-24 DIAGNOSIS — Z7984 Long term (current) use of oral hypoglycemic drugs: Secondary | ICD-10-CM | POA: Insufficient documentation

## 2020-10-24 DIAGNOSIS — M7989 Other specified soft tissue disorders: Secondary | ICD-10-CM | POA: Insufficient documentation

## 2020-10-24 DIAGNOSIS — F17210 Nicotine dependence, cigarettes, uncomplicated: Secondary | ICD-10-CM | POA: Diagnosis not present

## 2020-10-24 DIAGNOSIS — C3491 Malignant neoplasm of unspecified part of right bronchus or lung: Secondary | ICD-10-CM | POA: Diagnosis not present

## 2020-10-24 DIAGNOSIS — C3490 Malignant neoplasm of unspecified part of unspecified bronchus or lung: Secondary | ICD-10-CM | POA: Diagnosis not present

## 2020-10-24 DIAGNOSIS — Z923 Personal history of irradiation: Secondary | ICD-10-CM | POA: Insufficient documentation

## 2020-10-24 DIAGNOSIS — I2510 Atherosclerotic heart disease of native coronary artery without angina pectoris: Secondary | ICD-10-CM | POA: Diagnosis not present

## 2020-10-24 DIAGNOSIS — J984 Other disorders of lung: Secondary | ICD-10-CM | POA: Diagnosis not present

## 2020-10-24 LAB — CBC WITH DIFFERENTIAL/PLATELET
Abs Immature Granulocytes: 0.07 10*3/uL (ref 0.00–0.07)
Basophils Absolute: 0.1 10*3/uL (ref 0.0–0.1)
Basophils Relative: 1 %
Eosinophils Absolute: 0.1 10*3/uL (ref 0.0–0.5)
Eosinophils Relative: 1 %
HCT: 48.6 % (ref 39.0–52.0)
Hemoglobin: 16.4 g/dL (ref 13.0–17.0)
Immature Granulocytes: 1 %
Lymphocytes Relative: 20 %
Lymphs Abs: 2.2 10*3/uL (ref 0.7–4.0)
MCH: 32.5 pg (ref 26.0–34.0)
MCHC: 33.7 g/dL (ref 30.0–36.0)
MCV: 96.4 fL (ref 80.0–100.0)
Monocytes Absolute: 1 10*3/uL (ref 0.1–1.0)
Monocytes Relative: 9 %
Neutro Abs: 7.8 10*3/uL — ABNORMAL HIGH (ref 1.7–7.7)
Neutrophils Relative %: 68 %
Platelets: 199 10*3/uL (ref 150–400)
RBC: 5.04 MIL/uL (ref 4.22–5.81)
RDW: 14.3 % (ref 11.5–15.5)
WBC: 11.3 10*3/uL — ABNORMAL HIGH (ref 4.0–10.5)
nRBC: 0 % (ref 0.0–0.2)

## 2020-10-24 LAB — COMPREHENSIVE METABOLIC PANEL
ALT: 23 U/L (ref 0–44)
AST: 15 U/L (ref 15–41)
Albumin: 3.9 g/dL (ref 3.5–5.0)
Alkaline Phosphatase: 119 U/L (ref 38–126)
Anion gap: 10 (ref 5–15)
BUN: 14 mg/dL (ref 8–23)
CO2: 26 mmol/L (ref 22–32)
Calcium: 9.5 mg/dL (ref 8.9–10.3)
Chloride: 100 mmol/L (ref 98–111)
Creatinine, Ser: 1.12 mg/dL (ref 0.61–1.24)
GFR, Estimated: >60 mL/min (ref >60)
Glucose, Bld: 187 mg/dL — ABNORMAL HIGH (ref 70–99)
Potassium: 3.6 mmol/L (ref 3.5–5.1)
Sodium: 136 mmol/L (ref 135–145)
Total Bilirubin: 0.6 mg/dL (ref 0.3–1.2)
Total Protein: 7.4 g/dL (ref 6.5–8.1)

## 2020-10-24 MED ORDER — IOHEXOL 300 MG/ML  SOLN
75.0000 mL | Freq: Once | INTRAMUSCULAR | Status: AC | PRN
  Administered 2020-10-24: 75 mL via INTRAVENOUS

## 2020-10-25 ENCOUNTER — Ambulatory Visit (HOSPITAL_COMMUNITY)
Admission: RE | Admit: 2020-10-25 | Discharge: 2020-10-25 | Disposition: A | Discharge status: Home / Self Care | Payer: Medicare HMO | Source: Ambulatory Visit | Attending: Physician Assistant | Admitting: Physician Assistant

## 2020-10-25 ENCOUNTER — Other Ambulatory Visit (HOSPITAL_COMMUNITY): Payer: Self-pay | Admitting: Physician Assistant

## 2020-10-25 DIAGNOSIS — E119 Type 2 diabetes mellitus without complications: Secondary | ICD-10-CM | POA: Diagnosis not present

## 2020-10-25 DIAGNOSIS — M545 Low back pain, unspecified: Secondary | ICD-10-CM | POA: Diagnosis not present

## 2020-10-25 DIAGNOSIS — R609 Edema, unspecified: Secondary | ICD-10-CM | POA: Diagnosis not present

## 2020-10-25 DIAGNOSIS — Z6833 Body mass index (BMI) 33.0-33.9, adult: Secondary | ICD-10-CM | POA: Diagnosis not present

## 2020-10-25 DIAGNOSIS — M5459 Other low back pain: Secondary | ICD-10-CM | POA: Insufficient documentation

## 2020-10-25 DIAGNOSIS — G479 Sleep disorder, unspecified: Secondary | ICD-10-CM | POA: Diagnosis not present

## 2020-10-26 ENCOUNTER — Other Ambulatory Visit: Payer: Self-pay

## 2020-10-26 ENCOUNTER — Ambulatory Visit (INDEPENDENT_AMBULATORY_CARE_PROVIDER_SITE_OTHER): Payer: Medicare HMO

## 2020-10-26 DIAGNOSIS — J4540 Moderate persistent asthma, uncomplicated: Secondary | ICD-10-CM | POA: Diagnosis not present

## 2020-10-31 ENCOUNTER — Other Ambulatory Visit: Payer: Self-pay

## 2020-10-31 ENCOUNTER — Inpatient Hospital Stay (HOSPITAL_BASED_OUTPATIENT_CLINIC_OR_DEPARTMENT_OTHER): Payer: Medicare HMO | Admitting: Hematology

## 2020-10-31 VITALS — BP 171/59 | HR 80 | Temp 97.4°F | Resp 20 | Wt 230.7 lb

## 2020-10-31 DIAGNOSIS — Z923 Personal history of irradiation: Secondary | ICD-10-CM | POA: Diagnosis not present

## 2020-10-31 DIAGNOSIS — C3411 Malignant neoplasm of upper lobe, right bronchus or lung: Secondary | ICD-10-CM | POA: Diagnosis not present

## 2020-10-31 DIAGNOSIS — C3490 Malignant neoplasm of unspecified part of unspecified bronchus or lung: Secondary | ICD-10-CM | POA: Diagnosis not present

## 2020-10-31 DIAGNOSIS — F17210 Nicotine dependence, cigarettes, uncomplicated: Secondary | ICD-10-CM | POA: Diagnosis not present

## 2020-10-31 DIAGNOSIS — C3491 Malignant neoplasm of unspecified part of right bronchus or lung: Secondary | ICD-10-CM | POA: Diagnosis not present

## 2020-10-31 DIAGNOSIS — M7989 Other specified soft tissue disorders: Secondary | ICD-10-CM | POA: Diagnosis not present

## 2020-10-31 DIAGNOSIS — Z7984 Long term (current) use of oral hypoglycemic drugs: Secondary | ICD-10-CM | POA: Diagnosis not present

## 2020-10-31 NOTE — Patient Instructions (Signed)
[place] at [facility] Discharge Instructions  You were seen today by Dr. Delton Coombes. He went over your recent results. Resume taking the full amount of metformin (500mg ). Dr. Delton Coombes will see you back in 6 months for labs and follow up.   Thank you for choosing [place] at [facility] to provide your oncology and hematology care.  To afford each patient quality time with our provider, please arrive at least 15 minutes before your scheduled appointment time.   If you have a lab appointment with the [facility] please come in thru the Main Entrance and check in at the main information desk  You need to re-schedule your appointment should you arrive 10 or more minutes late.  We strive to give you quality time with our providers, and arriving late affects you and other patients whose appointments are after yours.  Also, if you no show three or more times for appointments you may be dismissed from the clinic at the providers discretion.     Again, thank you for choosing [facility].  Our hope is that these requests will decrease the amount of time that you wait before being seen by our physicians.       _____________________________________________________________  Should you have questions after your visit to [facility], please contact our office at [phone] between the hours of 8:00 a.m. and 4:30 p.m.  Voicemails left after 4:00 p.m. will not be returned until the following business day.  For prescription refill requests, have your pharmacy contact our office and allow 72 hours.    Cancer Center Support Programs:   > Cancer Support Group  2nd Tuesday of the month 1pm-2pm, Journey Room

## 2020-10-31 NOTE — Progress Notes (Signed)
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  PRIOR THERAPY: SBRT 54 Gy in 3 fractions to right lung from 05/05/2018 to 05/12/2018  NGS Results: not done  CURRENT THERAPY: observation  BRIEF ONCOLOGIC HISTORY:  Oncology History  Squamous cell carcinoma of right lung ([place])  03/29/2018 Initial Diagnosis   Squamous cell carcinoma of right lung ([place])   03/29/2018 Cancer Staging   Staging form: Lung, AJCC 8th Edition - Clinical stage from 03/29/2018: Stage IA3 (cT1c, cN0, cM0) - Signed by Derek Jack, MD on 03/29/2018     CANCER STAGING: Cancer Staging Squamous cell carcinoma of right lung [facility]) Staging form: Lung, AJCC 8th Edition - Clinical stage from 03/29/2018: Stage IA3 (cT1c, cN0, cM0) - Signed by Derek Jack, MD on 03/29/2018   INTERVAL HISTORY:  Kevin Frank, a 80 y.o. male, returns for routine follow-up of his squamous cell carcinoma of the right lung. Machai was last seen on 04/26/2020.   Today he reports feeling good. He denies any new symptoms since last visit. He denies changes in breathing. He smokes a couple of packs a day. He denies CP. He is currently taking fluid pills for swelling in lower legs. He has reduced amount of metformin.  Resume full amount of metformin. RTC REVIEW OF SYSTEMS:  Review of Systems  Constitutional: Negative for appetite change and fatigue.  Cardiovascular: Negative for chest pain.  All other systems reviewed and are negative.   PAST MEDICAL/SURGICAL HISTORY:  Past Medical History:  Diagnosis Date  . AAA (abdominal aortic aneurysm) ([place]) 01/2008   3.2cm  . Allergy   . Arthritis   . Asthma   . BPH (benign prostatic hyperplasia)   . Chronic knee pain    bilateral  . COPD (chronic  obstructive pulmonary disease) ([place])   . Diabetes mellitus    type II  . Gout   . History of kidney stones   . Hypercholesterolemia   . Hyperlipidemia   . Hypertension   . Lung cancer ([place]) 2019   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   . Normal nuclear stress test 2011  . Obesity   . Obstructive sleep apnea on CPAP   . Presence of indwelling urinary catheter   . Shortness of breath    due to cigarette abuse  . Tobacco abuse    Past Surgical History:  Procedure Laterality Date  . ABDOMINAL AORTIC ENDOVASCULAR FENESTRATED STENT GRAFT N/A 04/09/2016   Procedure: ABDOMINAL AORTIC ENDOVASCULAR FENESTRATED STENT GRAFT with left brachial artery access using ultrasound guidance;  Surgeon: Serafina [place], MD;  Location: [place] [facility];  Service: Vascular;  Laterality: N/A;  . [place]   [place]  . COLONOSCOPY  2006   2006: multiple diminutive polyps in rectum, one pedunculated polyp at 10 cm s/p snare removal. Left-sided diverticulum, pedunculated polyps at 35 cm and splenic flexure s/p snare removal. No path available.   . COLONOSCOPY WITH PROPOFOL N/A 11/24/2019   Procedure: COLONOSCOPY WITH PROPOFOL;  Surgeon: Daneil Dolin, MD;  [facility];  Service: Endoscopy;  Laterality: N/A;  9:45am  . PERIPHERAL VASCULAR CATHETERIZATION N/A 04/10/2016   Procedure: Renal Angiography;  Surgeon: Angelia Mould, MD;  Location: [facility];  Service: Cardiovascular;  Laterality: N/A;  . POLYPECTOMY  11/24/2019   Procedure: POLYPECTOMY;  Surgeon: Daneil Dolin, MD;  [facility];  Service: Endoscopy;;  . TRANSURETHRAL RESECTION OF PROSTATE  01/07/2011   Procedure: TRANSURETHRAL RESECTION OF THE PROSTATE (TURP);  Surgeon: Marissa Nestle;  Location: [facility];  Service: Urology;  Laterality: N/A;    SOCIAL HISTORY:  Social History   Socioeconomic History  . Marital status: Married    Spouse name: Not on file  . Number of children: Not on  file  . Years of education: Not on file  . Highest education level: Not on file  Occupational History  . Not on file  Tobacco Use  . Smoking status: Current Every Day Smoker    Packs/day: 3.00    Years: 50.00    Pack years: 150.00    Types: Cigarettes  . Smokeless tobacco: Never Used  . Tobacco comment: 2ppd 10/30/2016 ee  Vaping Use  . Vaping Use: Never used  Substance and Sexual Activity  . Alcohol use: Yes    Alcohol/week: 0.0 standard drinks    Comment: very little  . Drug use: No  . Sexual activity: Not on file  Other Topics Concern  . Not on file  Social History Narrative  . Not on file   Social Determinants of Health   Financial Resource Strain: Not on file  Food Insecurity: Not on file  Transportation Needs: Not on file  Physical Activity: Not on file  Stress: Not on file  Social Connections: Not on file  Intimate Partner Violence: Not on file    FAMILY HISTORY:  Family History  Problem Relation Age of Onset  . Liver disease Father   . Lymphoma Mother   . Diabetes Mother   . Hypertension Mother   . Cancer Mother   . Mental retardation Other        sibling -- from birth   . Asthma Daughter   . Allergic rhinitis Neg Hx   . Angioedema Neg Hx   . Atopy Neg Hx   . Eczema Neg Hx   . Immunodeficiency Neg Hx   . Colon cancer Neg Hx     CURRENT MEDICATIONS:  Current Outpatient Medications  Medication Sig Dispense Refill  . albuterol (VENTOLIN HFA) 108 (90 Base) MCG/ACT inhaler Use 4 puffs every 4-6 hours as needed for cough or wheeze 18 g 2  . allopurinol (ZYLOPRIM) 300 MG tablet Take 1 tablet (300 mg total) by mouth daily. 30 tablet 1  . amLODipine (NORVASC) 10 MG tablet Take 1 tablet (10 mg total) by mouth daily. 14 tablet 0  . atorvastatin (LIPITOR) 20 MG tablet Take 1 tablet (20 mg total) by mouth daily. 90 tablet 1  . Budeson-Glycopyrrol-Formoterol (BREZTRI AEROSPHERE) 160-9-4.8 MCG/ACT AERO Inhale 2 puffs into the lungs 2 (two) times daily. 10.7 g 1   . clopidogrel (PLAVIX) 75 MG tablet Take 1 tablet (75 mg total) by mouth daily. 30 tablet 1  . hydrALAZINE (APRESOLINE) 50 MG tablet TAKE (1) TABLET TWICE DAILY. 180 tablet 2  . losartan (COZAAR) 100 MG tablet TAKE 1 TABLET BY MOUTH ONCE DAILY. 30 tablet 0  . metFORMIN (GLUCOPHAGE) 500 MG tablet Take 1,000 mg by mouth 2 (two) times daily with a meal.     . metoprolol succinate (TOPROL-XL) 25 MG 24 hr tablet Take 1 tablet (25 mg total) by mouth daily. 14 tablet 0  . polyethylene glycol-electrolytes (TRILYTE) 420 g solution Take 4,000 mLs by mouth as directed.  4000 mL 0  . sertraline (ZOLOFT) 50 MG tablet Take 1 tablet (50 mg total) by mouth daily. 30 tablet 1  . torsemide (DEMADEX) 20 MG tablet Take 20 mg by mouth daily.     Current Facility-Administered Medications  Medication Dose Route Frequency Provider Last Rate Last Admin  . ipratropium (ATROVENT) nebulizer solution 0.5 mg  0.5 mg Nebulization Once Gean Quint, MD      . levalbuterol Penne Lash) nebulizer solution 1.25 mg  1.25 mg Nebulization Once Gean Quint, MD      . omalizumab Arvid Right) injection 375 mg  375 mg Subcutaneous Q14 Days Valentina Shaggy, MD   375 mg at 10/26/20 1043    ALLERGIES:  Allergies  Allergen Reactions  . No Known Allergies     PHYSICAL EXAM:  Performance status (ECOG): 1 - Symptomatic but completely ambulatory  There were no vitals filed for this visit. Wt Readings from Last 3 Encounters:  10/03/20 227 lb 9.6 oz (103.2 kg)  07/12/20 226 lb (102.5 kg)  07/02/20 226 lb (102.5 kg)   Physical Exam Vitals reviewed.  Constitutional:      Appearance: Normal appearance. He is obese.  Cardiovascular:     Rate and Rhythm: Normal rate and regular rhythm.     Pulses: Normal pulses.     Heart sounds: Normal heart sounds.  Pulmonary:     Effort: Pulmonary effort is normal.     Breath sounds: Normal breath sounds.  Musculoskeletal:     Right lower leg: Edema (1+) present.     Left lower leg:  Edema (1+) present.  Neurological:     General: No focal deficit present.     Mental Status: He is alert and oriented to person, place, and time.  Psychiatric:        Mood and Affect: Mood normal.        Behavior: Behavior normal.      LABORATORY DATA:  I have reviewed the labs as listed.  CBC Latest Ref Rng & Units 10/24/2020 11/22/2019 09/06/2019  WBC 4.0 - 10.5 K/uL 11.3(H) 8.8 10.6(H)  Hemoglobin 13.0 - 17.0 g/dL 16.4 15.4 15.3  Hematocrit 39.0 - 52.0 % 48.6 46.8 46.7  Platelets 150 - 400 K/uL 199 205 199   CMP Latest Ref Rng & Units 10/24/2020 04/20/2020 11/22/2019  Glucose 70 - 99 mg/dL 187(H) - 140(H)  BUN 8 - 23 mg/dL 14 - 14  Creatinine 0.61 - 1.24 mg/dL 1.12 0.90 1.02  Sodium 135 - 145 mmol/L 136 - 139  Potassium 3.5 - 5.1 mmol/L 3.6 - 4.4  Chloride 98 - 111 mmol/L 100 - 103  CO2 22 - 32 mmol/L 26 - 25  Calcium 8.9 - 10.3 mg/dL 9.5 - 9.1  Total Protein 6.5 - 8.1 g/dL 7.4 - 7.0  Total Bilirubin 0.3 - 1.2 mg/dL 0.6 - 0.4  Alkaline Phos 38 - 126 U/L 119 - 103  AST 15 - 41 U/L 15 - 19  ALT 0 - 44 U/L 23 - 21    DIAGNOSTIC IMAGING:  I have independently reviewed the scans and discussed with the patient. DG Lumbar Spine 2-3 Views  Result Date: 10/28/2020 CLINICAL DATA:  Low back pain.  No known injury. EXAM: LUMBAR SPINE - 2-3 VIEW COMPARISON:  None. FINDINGS: A vascular stent is associated with the abdominal aorta and iliac vessels. Soft tissues are otherwise unremarkable. No malalignment. No fracture. Multilevel degenerative disc disease with anterior osteophytes. IMPRESSION: 1. Degenerative disc disease as above. No  other cause for the patient's symptoms identified. Electronically Signed   By: Dorise Bullion III M.D   On: 10/28/2020 16:44   CT Chest W Contrast  Result Date: 10/25/2020 CLINICAL DATA:  Follow-up non-small cell lung cancer, radiation therapy completed 2 years ago EXAM: CT CHEST WITH CONTRAST TECHNIQUE: Multidetector CT imaging of the chest was performed during  intravenous contrast administration. CONTRAST:  54mL OMNIPAQUE IOHEXOL 300 MG/ML  SOLN COMPARISON:  04/20/2020 FINDINGS: Cardiovascular: Aortic atherosclerosis. Normal heart size. Three-vessel coronary artery calcifications. No pericardial effusion. Mediastinum/Nodes: No enlarged mediastinal, hilar, or axillary lymph nodes. Thyroid gland, trachea, and esophagus demonstrate no significant findings. Lungs/Pleura: Moderate centrilobular emphysema. No significant interval change in an irregular nodule of the posterior right upper lobe, measuring 2.0 x 0.9 cm (series 4, image 33). Mild scarring of the dependent bilateral lung bases. No pleural effusion or pneumothorax. Upper Abdomen: No acute abnormality. Partially imaged aortic stent endograft in the included upper abdomen. Hepatic steatosis. Musculoskeletal: No chest wall mass or suspicious bone lesions identified. IMPRESSION: 1. No significant interval change in an irregular nodule of the posterior right upper lobe, measuring 2.0 x 0.9 cm. Findings are consistent with treated malignancy. 2. No evidence of lymphadenopathy or metastatic disease in the chest. 3. Emphysema. 4. Coronary artery disease. 5. Hepatic steatosis. Aortic Atherosclerosis (ICD10-I70.0) and Emphysema (ICD10-J43.9). Electronically Signed   By: Eddie Candle M.D.   On: 10/25/2020 09:09     ASSESSMENT:  1. Stage I (T1CN0M0) squamous cell carcinoma right upper lobe of the lung: -CT-guided biopsy on 03/19/2018 consistent with squamous cell carcinoma. -He was felt not to be a surgical candidate. -SBRT to the right lung lesion from 05/05/2018 through 05/12/2018, 54 Gray in 3 fractions of 18 [place] Cables. -He is continuing to smoke 2 packs of cigarettes per day. -CT chest on 04/20/2020 shows unchanged posttreatment appearance of irregular nodule of the posterior right upper lobe measuring 2.1 x 0.8 cm.  Stable small pulmonary nodules.  Unchanged prominent pretracheal lymph node measuring 2.4 x 0.9  cm.   PLAN:  1. Stage I (T1CN0M0) squamous cell carcinoma right upper lobe of the lung: -He is continuing to smoke up to 2 packs of cigarettes per day. - He reports that his breathing is up to his baseline.  No new cough or hemoptysis. - Reviewed CT chest with contrast from 10/24/2020 which did not show any interval change.  Irregular nodule in the posterior right upper lobe measures 2 x 1 cm and stable.  No evidence of lymphadenopathy. - Reviewed labs from 10/24/2020 which showed normal hemoglobin and CMP. - Recommend follow-up in 6 months with repeat labs and CT of the chest.   Orders placed this encounter:  No orders of the defined types were placed in this encounter.    Derek Jack, MD [place] [phone]   I, Thana Ates, am acting as a scribe for Dr. Sanda Linger.  I, Derek Jack MD, have reviewed the above documentation for accuracy and completeness, and I agree with the above.

## 2020-11-09 ENCOUNTER — Ambulatory Visit: Payer: Self-pay

## 2020-11-16 ENCOUNTER — Other Ambulatory Visit: Payer: Self-pay

## 2020-11-16 ENCOUNTER — Encounter: Payer: Self-pay | Admitting: Allergy & Immunology

## 2020-11-16 ENCOUNTER — Ambulatory Visit: Payer: Medicare HMO | Admitting: Allergy & Immunology

## 2020-11-16 ENCOUNTER — Ambulatory Visit: Payer: Self-pay

## 2020-11-16 VITALS — BP 142/62 | HR 77 | Temp 98.2°F | Resp 14 | Ht 70.0 in | Wt 228.0 lb

## 2020-11-16 DIAGNOSIS — J449 Chronic obstructive pulmonary disease, unspecified: Secondary | ICD-10-CM

## 2020-11-16 DIAGNOSIS — J4540 Moderate persistent asthma, uncomplicated: Secondary | ICD-10-CM

## 2020-11-16 DIAGNOSIS — J3089 Other allergic rhinitis: Secondary | ICD-10-CM | POA: Diagnosis not present

## 2020-11-16 DIAGNOSIS — L03113 Cellulitis of right upper limb: Secondary | ICD-10-CM | POA: Diagnosis not present

## 2020-11-16 DIAGNOSIS — J302 Other seasonal allergic rhinitis: Secondary | ICD-10-CM | POA: Diagnosis not present

## 2020-11-16 MED ORDER — DOXYCYCLINE HYCLATE 100 MG PO CAPS: 100.0000 mg | ORAL_CAPSULE | Freq: Two times a day (BID) | ORAL | 0 refills | Status: AC

## 2020-11-16 MED ORDER — MUPIROCIN 2 % EX OINT: 1.0000 "application " | TOPICAL_OINTMENT | Freq: Two times a day (BID) | CUTANEOUS | 0 refills | Status: AC

## 2020-11-16 NOTE — Progress Notes (Signed)
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   Plan/Recommendations:   1. Asthma/COPD overlap - Lung testing looks stable. - Continue with Breztri two puffs twice daily.  - Continue Xolair injections every 2 weeks - May use albuterol 2 puffs every 4 hours as needed for cough, wheeze, tightness in chest, shortness of breath OR may use albuterol via nebulizer 1 ampoule every 4 hours as needed for cough, wheeze, tightness in chest, or shortness of breath. - When you come in for your next Xolair injection finger COVID-19 vaccine card so we can figure out if it is time to receive your booster Asthma control goals:   Full participation in all desired activities (may need albuterol before activity)  Albuterol use two time or less a week on average (not counting use with activity)  Cough interfering with sleep two time or less a month  Oral steroids no more than once a year  No hospitalizations  2. Seasonal and perennial allergic rhinitis - May use an antihistamine such as Claritin, Allegra, Xyzal, or Zyrtec once a day as needed for runny nose  3. Right arm cellulitis (? Spider bite) - Use warm compresses as tolerated. - Start doxycycline 100mg  twice daily for two weeks (AVOID the sun and use sunscreen and big hats). - Start mupirocin twice daily as needed.    4. Follow up as scheduled.    Subjective:   Kevin Frank is a 80 y.o. male presenting today for follow up of  Chief Complaint   Patient presents with  . Asthma    Kevin Frank has a history of the following: Patient Active Problem List   Diagnosis Date Noted  . Wrist pain, acute, right 09/07/2019  . Right forearm pain 09/07/2019  . Uncontrolled type 2 diabetes mellitus with hyperglycemia (Kevin Frank) 06/27/2019  . Mixed hyperlipidemia 06/27/2019  . Class 1 obesity due to excess calories with serious comorbidity and body mass index (BMI) of 32.0 to 32.9 in adult 06/27/2019  . Diabetes mellitus without complication (Kevin Frank) 40/03/2724  . Positive colorectal cancer screening using Cologuard test 06/14/2019  . Squamous cell carcinoma of right lung (Kevin Frank) 03/29/2018  . Seasonal and perennial allergic rhinitis 09/22/2017  . Asthma-COPD overlap syndrome ([place]) 09/22/2017  . Obstructive sleep apnea on CPAP 04/23/2017  . Chronic knee pain 04/23/2017  . AKI (acute kidney injury) (Kevin Frank) 04/23/2017  . Chronic diastolic CHF (congestive heart failure) (Kevin Frank) 04/23/2017  . Cellulitis 04/23/2017  . Cellulitis of left lower leg   . Current smoker 03/05/2016  . Incidental lung nodule, > 12mm and < 73mm 03/05/2016  . Stage 2 moderate COPD by GOLD classification (Kevin Frank) 03/05/2016  . AAA (abdominal aortic aneurysm) without rupture (Kevin Frank) 06/14/2015  . OBSTRUCTIVE SLEEP APNEA 06/09/2007  . HYPERCHOLESTEROLEMIA 05/06/2007  . GOUT 05/06/2007  . DEPRESSION 05/06/2007  . Essential hypertension, benign 05/06/2007  . ABDOMINAL AORTIC ANEURYSM 05/06/2007  . BENIGN PROSTATIC HYPERTROPHY, HX OF 05/06/2007    History obtained from: chart review and patient and his wife.  Kevin Frank is a 80 y.o. male presenting for a sick  visit.  He was last seen in the clinic in April 2022.  At that time, we continue with Breztri 2 puffs twice daily as well as Xolair every 2 weeks.  For his allergic rhinitis, we continued with an antihistamine as needed and nasal saline rinses.  We again discussed smoking cessation.  Since the last visit, he has mostly done well. He  came in today for evaluation of a rash on his right arm. He tells me that the rash started appearing around 3-4 days ago. It has continued to enlarge. He denies knowing about any kind of bite, but he does spend a lot of time outdoors. He has not had any kind of discharge from the area. It is rather red and inflamed and slightly pruritic. There is minimal pain to it, but when he bends his arm it does become more uncomfortable. He has never had any similar lesion. No one else in the family has a similar lesion=.  [name] asthma has been well controlled. He has not required rescue medication, experienced nocturnal awakenings due to lower respiratory symptoms, nor have activities of daily living been limited. He has required no Emergency Department or Urgent Care visits for his asthma. He has required zero courses of systemic steroids for asthma exacerbations since the last visit. ACT score today is 158, indicating subpar asthma symptom control.   Otherwise, there have been no changes to his past medical history, surgical history, family history, or social history.    Review of Systems  Constitutional: Negative.  Negative for chills, fever, malaise/fatigue and weight loss.  HENT: Negative for congestion, ear discharge, ear pain and sinus pain.   Eyes: Negative for pain, discharge and redness.  Respiratory: Positive for cough. Negative for sputum production, shortness of breath and wheezing.   Cardiovascular: Negative.  Negative for chest pain and palpitations.  Gastrointestinal: Negative for abdominal pain, constipation, diarrhea, heartburn, nausea and vomiting.  Skin: Positive for itching and rash.  Neurological: Negative for dizziness and headaches.  Endo/Heme/Allergies: Positive for environmental allergies. Does not bruise/bleed easily.       Objective:   Blood pressure (!) 142/62, pulse 77, temperature 98.2 F (36.8 C), temperature source Temporal, resp. rate 14, height 5\' 10"  (1.778 m),  weight 228 lb (103.4 kg), SpO2 94 %. Body mass index is 32.71 kg/m.   Physical Exam:  Physical Exam Constitutional:      Appearance: He is well-developed.  HENT:     Head: Normocephalic and atraumatic.     Right Ear: Tympanic membrane, ear canal and external ear normal.     Left Ear: Tympanic membrane and ear canal normal.     Nose: No nasal deformity, septal deviation, mucosal edema or rhinorrhea.     Right Sinus: No maxillary sinus tenderness or frontal sinus tenderness.     Left Sinus: No maxillary sinus tenderness or frontal sinus tenderness.     Mouth/Throat:     Mouth: Mucous membranes are not pale and not dry.     Pharynx: Uvula midline.  Eyes:     General:        Right eye: No discharge.        Left eye: No discharge.     Conjunctiva/sclera: Conjunctivae normal.     Right eye: Right conjunctiva is not injected. No chemosis.    Left eye: Left conjunctiva is not injected. No chemosis.    Pupils: Pupils are equal, round, and reactive to light.  Cardiovascular:     Rate and Rhythm:  Normal rate and regular rhythm.     Heart sounds: Normal heart sounds.  Pulmonary:     Effort: Pulmonary effort is normal. No tachypnea, accessory muscle usage or respiratory distress.     Breath sounds: Normal breath sounds. No wheezing, rhonchi or rales.  Chest:     Chest wall: No tenderness.  Lymphadenopathy:     Cervical: No cervical adenopathy.  Skin:    General: Skin is warm.     Capillary Refill: Capillary refill takes less than 2 seconds.     Coloration: Skin is not pale.     Findings: Rash present. No abrasion, erythema or petechiae. Rash is not papular, urticarial or vesicular.     Comments: Large rash on the right arm with an particularly enlarged area just distal to the antecubital fossa. There is no drainage and the lesion does not feel fluctuant.   Neurological:     Mental Status: He is alert.        Diagnostic studies:   Spirometry: results abnormal (FEV1: 1.40/50%,  FVC: 2.19/58%, FEV1/FVC: 64%).    Spirometry consistent with possible restrictive disease. Overall values are stable compared to those obtain in April 2022.   Allergy Studies: none       Salvatore Marvel, MD  [facility] [place] [facility] [place]

## 2020-11-16 NOTE — Patient Instructions (Signed)
1. Asthma/COPD overlap - Lung testing looks stable. - Continue with Breztri two puffs twice daily.  - Continue Xolair injections every 2 weeks - May use albuterol 2 puffs every 4 hours as needed for cough, wheeze, tightness in chest, shortness of breath OR may use albuterol via nebulizer 1 ampoule every 4 hours as needed for cough, wheeze, tightness in chest, or shortness of breath. - When you come in for your next Xolair injection finger COVID-19 vaccine card so we can figure out if it is time to receive your booster Asthma control goals:   Full participation in all desired activities (may need albuterol before activity)  Albuterol use two time or less a week on average (not counting use with activity)  Cough interfering with sleep two time or less a month  Oral steroids no more than once a year  No hospitalizations  2. Seasonal and perennial allergic rhinitis - May use an antihistamine such as Claritin, Allegra, Xyzal, or Zyrtec once a day as needed for runny nose  3. Right arm cellulitis (? Spider bite) - Use warm compresses as tolerated. - Start doxycycline 100mg  twice daily for two weeks (AVOID the sun and use sunscreen and big hats). - Start mupirocin twice daily as needed.    4. Follow up as scheduled.    Please inform us of any Emergency Department visits, hospitalizations, or changes in symptoms. Call us before going to the ED for breathing or allergy symptoms since we might be able to fit you in for a sick visit. Feel free to contact us anytime with any questions, problems, or concerns.  It was a pleasure to see you and your family again today! SEE YOU ON July 16TH!   Websites that have reliable patient information: 1. American Academy of Asthma, Allergy, and Immunology: www.aaaai.org 2. Food Allergy Research and Education (FARE): foodallergy.org 3. Mothers of Asthmatics: http://www.asthmacommunitynetwork.org 4. American College of Allergy, Asthma, and Immunology:  www.acaai.org   COVID-19 Vaccine Information can be found at: ShippingScam.co.uk For questions related to vaccine distribution or appointments, please email vaccine@[email or web address] [email or web address] or call [phone].   We realize that you might be concerned about having an allergic reaction to the COVID19 vaccines. To help with that concern, WE ARE OFFERING THE COVID19 VACCINES IN OUR OFFICE! Ask the front desk for dates!     "Like" [place] on Facebook and Instagram for our latest updates!      A healthy democracy works best when [employer] participate! Make sure you are registered to vote! If you have moved or changed any of your contact information, you will need to get this updated before voting!  In some cases, you MAY be able to register to vote online: [email or web address]

## 2020-11-19 ENCOUNTER — Encounter: Payer: Self-pay | Admitting: Allergy & Immunology

## 2020-11-30 ENCOUNTER — Other Ambulatory Visit: Payer: Self-pay

## 2020-11-30 ENCOUNTER — Ambulatory Visit (INDEPENDENT_AMBULATORY_CARE_PROVIDER_SITE_OTHER): Payer: Medicare HMO

## 2020-11-30 DIAGNOSIS — J4540 Moderate persistent asthma, uncomplicated: Secondary | ICD-10-CM | POA: Diagnosis not present

## 2020-12-06 ENCOUNTER — Other Ambulatory Visit: Payer: Self-pay

## 2020-12-06 ENCOUNTER — Ambulatory Visit: Payer: Medicare HMO | Admitting: Orthopedic Surgery

## 2020-12-06 ENCOUNTER — Encounter: Payer: Self-pay | Admitting: Orthopedic Surgery

## 2020-12-06 ENCOUNTER — Telehealth: Payer: Self-pay | Admitting: Allergy & Immunology

## 2020-12-06 VITALS — BP 157/73 | HR 93 | Ht 70.0 in | Wt 226.5 lb

## 2020-12-06 DIAGNOSIS — M25512 Pain in left shoulder: Secondary | ICD-10-CM

## 2020-12-06 DIAGNOSIS — G8929 Other chronic pain: Secondary | ICD-10-CM

## 2020-12-06 MED ORDER — BREZTRI AEROSPHERE 160-9-4.8 MCG/ACT IN AERO: 2.0000 | INHALATION_SPRAY | Freq: Two times a day (BID) | RESPIRATORY_TRACT | 1 refills | Status: DC

## 2020-12-06 NOTE — Telephone Encounter (Signed)
Patient wife called and needs to have Breztri Aerosphere called into [facility] in [place]. [phone]

## 2020-12-06 NOTE — Telephone Encounter (Signed)
Breztri was sent into to [facility] per patient request.  Patient was notified.

## 2020-12-06 NOTE — Progress Notes (Signed)
Chief Complaint  Patient presents with   Shoulder Pain    L/ hurts bad/wants injection    Encounter Diagnosis  Name Primary?   Chronic left shoulder pain Yes    80 year old male chronic left shoulder pain requests injection  Procedure injection left subacromial space consent was given site was confirmed  The skin was cleaned with ethyl chloride prepped with alcohol and then we injected 40 mg of Depo-Medrol 2 cc 1% lidocaine into the subacromial space this was well tolerated without complications there were no untoward events

## 2020-12-10 ENCOUNTER — Ambulatory Visit
Admission: EM | Admit: 2020-12-10 | Discharge: 2020-12-10 | Disposition: A | Discharge status: Home / Self Care | Payer: Medicare HMO

## 2020-12-10 ENCOUNTER — Encounter: Payer: Self-pay | Admitting: Internal Medicine

## 2020-12-10 ENCOUNTER — Inpatient Hospital Stay (HOSPITAL_COMMUNITY)
Admission: EM | Admit: 2020-12-10 | Discharge: 2020-12-12 | DRG: 193 | Disposition: A | Discharge status: Home Health | Payer: Medicare HMO | Attending: Family Medicine | Admitting: Family Medicine

## 2020-12-10 ENCOUNTER — Encounter (HOSPITAL_COMMUNITY): Payer: Self-pay | Admitting: *Deleted

## 2020-12-10 ENCOUNTER — Other Ambulatory Visit: Payer: Self-pay

## 2020-12-10 ENCOUNTER — Emergency Department (HOSPITAL_COMMUNITY): Payer: Medicare HMO

## 2020-12-10 DIAGNOSIS — I110 Hypertensive heart disease with heart failure: Secondary | ICD-10-CM | POA: Diagnosis not present

## 2020-12-10 DIAGNOSIS — F419 Anxiety disorder, unspecified: Secondary | ICD-10-CM | POA: Diagnosis present

## 2020-12-10 DIAGNOSIS — E869 Volume depletion, unspecified: Secondary | ICD-10-CM | POA: Diagnosis not present

## 2020-12-10 DIAGNOSIS — I714 Abdominal aortic aneurysm, without rupture, unspecified: Secondary | ICD-10-CM | POA: Diagnosis present

## 2020-12-10 DIAGNOSIS — I10 Essential (primary) hypertension: Secondary | ICD-10-CM | POA: Diagnosis not present

## 2020-12-10 DIAGNOSIS — G4733 Obstructive sleep apnea (adult) (pediatric): Secondary | ICD-10-CM | POA: Diagnosis present

## 2020-12-10 DIAGNOSIS — Z825 Family history of asthma and other chronic lower respiratory diseases: Secondary | ICD-10-CM | POA: Diagnosis not present

## 2020-12-10 DIAGNOSIS — Z95820 Peripheral vascular angioplasty status with implants and grafts: Secondary | ICD-10-CM

## 2020-12-10 DIAGNOSIS — R0689 Other abnormalities of breathing: Secondary | ICD-10-CM | POA: Diagnosis not present

## 2020-12-10 DIAGNOSIS — E871 Hypo-osmolality and hyponatremia: Secondary | ICD-10-CM | POA: Diagnosis not present

## 2020-12-10 DIAGNOSIS — Z7984 Long term (current) use of oral hypoglycemic drugs: Secondary | ICD-10-CM

## 2020-12-10 DIAGNOSIS — R0602 Shortness of breath: Secondary | ICD-10-CM

## 2020-12-10 DIAGNOSIS — N179 Acute kidney failure, unspecified: Secondary | ICD-10-CM | POA: Diagnosis present

## 2020-12-10 DIAGNOSIS — J189 Pneumonia, unspecified organism: Secondary | ICD-10-CM

## 2020-12-10 DIAGNOSIS — F32A Depression, unspecified: Secondary | ICD-10-CM | POA: Diagnosis present

## 2020-12-10 DIAGNOSIS — Z20822 Contact with and (suspected) exposure to covid-19: Secondary | ICD-10-CM | POA: Diagnosis not present

## 2020-12-10 DIAGNOSIS — F17210 Nicotine dependence, cigarettes, uncomplicated: Secondary | ICD-10-CM | POA: Diagnosis present

## 2020-12-10 DIAGNOSIS — E119 Type 2 diabetes mellitus without complications: Secondary | ICD-10-CM | POA: Diagnosis not present

## 2020-12-10 DIAGNOSIS — Z87442 Personal history of urinary calculi: Secondary | ICD-10-CM

## 2020-12-10 DIAGNOSIS — E7800 Pure hypercholesterolemia, unspecified: Secondary | ICD-10-CM | POA: Diagnosis present

## 2020-12-10 DIAGNOSIS — Z7902 Long term (current) use of antithrombotics/antiplatelets: Secondary | ICD-10-CM

## 2020-12-10 DIAGNOSIS — J440 Chronic obstructive pulmonary disease with acute lower respiratory infection: Secondary | ICD-10-CM | POA: Diagnosis present

## 2020-12-10 DIAGNOSIS — J432 Centrilobular emphysema: Secondary | ICD-10-CM | POA: Diagnosis not present

## 2020-12-10 DIAGNOSIS — J181 Lobar pneumonia, unspecified organism: Principal | ICD-10-CM

## 2020-12-10 DIAGNOSIS — R509 Fever, unspecified: Secondary | ICD-10-CM | POA: Diagnosis not present

## 2020-12-10 DIAGNOSIS — Z8249 Family history of ischemic heart disease and other diseases of the circulatory system: Secondary | ICD-10-CM

## 2020-12-10 DIAGNOSIS — H9190 Unspecified hearing loss, unspecified ear: Secondary | ICD-10-CM | POA: Diagnosis present

## 2020-12-10 DIAGNOSIS — A419 Sepsis, unspecified organism: Secondary | ICD-10-CM

## 2020-12-10 DIAGNOSIS — E785 Hyperlipidemia, unspecified: Secondary | ICD-10-CM | POA: Diagnosis present

## 2020-12-10 DIAGNOSIS — Z79899 Other long term (current) drug therapy: Secondary | ICD-10-CM

## 2020-12-10 DIAGNOSIS — J9600 Acute respiratory failure, unspecified whether with hypoxia or hypercapnia: Secondary | ICD-10-CM | POA: Diagnosis present

## 2020-12-10 DIAGNOSIS — Z9079 Acquired absence of other genital organ(s): Secondary | ICD-10-CM

## 2020-12-10 DIAGNOSIS — J449 Chronic obstructive pulmonary disease, unspecified: Secondary | ICD-10-CM | POA: Diagnosis not present

## 2020-12-10 DIAGNOSIS — M109 Gout, unspecified: Secondary | ICD-10-CM | POA: Diagnosis present

## 2020-12-10 DIAGNOSIS — Z833 Family history of diabetes mellitus: Secondary | ICD-10-CM

## 2020-12-10 DIAGNOSIS — J4489 Other specified chronic obstructive pulmonary disease: Secondary | ICD-10-CM | POA: Diagnosis present

## 2020-12-10 DIAGNOSIS — J9601 Acute respiratory failure with hypoxia: Secondary | ICD-10-CM | POA: Diagnosis not present

## 2020-12-10 DIAGNOSIS — Z807 Family history of other malignant neoplasms of lymphoid, hematopoietic and related tissues: Secondary | ICD-10-CM

## 2020-12-10 DIAGNOSIS — J929 Pleural plaque without asbestos: Secondary | ICD-10-CM | POA: Diagnosis not present

## 2020-12-10 DIAGNOSIS — J441 Chronic obstructive pulmonary disease with (acute) exacerbation: Secondary | ICD-10-CM | POA: Diagnosis present

## 2020-12-10 DIAGNOSIS — I5032 Chronic diastolic (congestive) heart failure: Secondary | ICD-10-CM | POA: Diagnosis not present

## 2020-12-10 DIAGNOSIS — R062 Wheezing: Secondary | ICD-10-CM | POA: Diagnosis not present

## 2020-12-10 DIAGNOSIS — R0902 Hypoxemia: Secondary | ICD-10-CM | POA: Diagnosis not present

## 2020-12-10 DIAGNOSIS — C3491 Malignant neoplasm of unspecified part of right bronchus or lung: Secondary | ICD-10-CM | POA: Diagnosis present

## 2020-12-10 DIAGNOSIS — C3411 Malignant neoplasm of upper lobe, right bronchus or lung: Secondary | ICD-10-CM | POA: Diagnosis present

## 2020-12-10 DIAGNOSIS — I2510 Atherosclerotic heart disease of native coronary artery without angina pectoris: Secondary | ICD-10-CM | POA: Diagnosis not present

## 2020-12-10 DIAGNOSIS — R069 Unspecified abnormalities of breathing: Secondary | ICD-10-CM | POA: Diagnosis not present

## 2020-12-10 LAB — RESP PANEL BY RT-PCR (FLU A&B, COVID) ARPGX2
Influenza A by PCR: NEGATIVE
Influenza B by PCR: NEGATIVE
SARS Coronavirus 2 by RT PCR: NEGATIVE

## 2020-12-10 LAB — COMPREHENSIVE METABOLIC PANEL
ALT: 19 U/L (ref 0–44)
AST: 19 U/L (ref 15–41)
Albumin: 3.3 g/dL — ABNORMAL LOW (ref 3.5–5.0)
Alkaline Phosphatase: 87 U/L (ref 38–126)
Anion gap: 10 (ref 5–15)
BUN: 12 mg/dL (ref 8–23)
CO2: 24 mmol/L (ref 22–32)
Calcium: 8.9 mg/dL (ref 8.9–10.3)
Chloride: 97 mmol/L — ABNORMAL LOW (ref 98–111)
Creatinine, Ser: 0.86 mg/dL (ref 0.61–1.24)
GFR, Estimated: >60 mL/min (ref >60)
Glucose, Bld: 219 mg/dL — ABNORMAL HIGH (ref 70–99)
Potassium: 3.4 mmol/L — ABNORMAL LOW (ref 3.5–5.1)
Sodium: 131 mmol/L — ABNORMAL LOW (ref 135–145)
Total Bilirubin: 0.7 mg/dL (ref 0.3–1.2)
Total Protein: 7 g/dL (ref 6.5–8.1)

## 2020-12-10 LAB — CBC WITH DIFFERENTIAL/PLATELET
Abs Immature Granulocytes: 0.15 10*3/uL — ABNORMAL HIGH (ref 0.00–0.07)
Basophils Absolute: 0.1 10*3/uL (ref 0.0–0.1)
Basophils Relative: 0 %
Eosinophils Absolute: 0.1 10*3/uL (ref 0.0–0.5)
Eosinophils Relative: 1 %
HCT: 43.6 % (ref 39.0–52.0)
Hemoglobin: 14.9 g/dL (ref 13.0–17.0)
Immature Granulocytes: 1 %
Lymphocytes Relative: 6 %
Lymphs Abs: 1.1 10*3/uL (ref 0.7–4.0)
MCH: 32 pg (ref 26.0–34.0)
MCHC: 34.2 g/dL (ref 30.0–36.0)
MCV: 93.6 fL (ref 80.0–100.0)
Monocytes Absolute: 1.5 10*3/uL — ABNORMAL HIGH (ref 0.1–1.0)
Monocytes Relative: 8 %
Neutro Abs: 15.1 10*3/uL — ABNORMAL HIGH (ref 1.7–7.7)
Neutrophils Relative %: 84 %
Platelets: 147 10*3/uL — ABNORMAL LOW (ref 150–400)
RBC: 4.66 MIL/uL (ref 4.22–5.81)
RDW: 14 % (ref 11.5–15.5)
WBC: 18 10*3/uL — ABNORMAL HIGH (ref 4.0–10.5)
nRBC: 0 % (ref 0.0–0.2)

## 2020-12-10 LAB — CBC
HCT: 42.1 % (ref 39.0–52.0)
Hemoglobin: 14.3 g/dL (ref 13.0–17.0)
MCH: 32.1 pg (ref 26.0–34.0)
MCHC: 34 g/dL (ref 30.0–36.0)
MCV: 94.6 fL (ref 80.0–100.0)
Platelets: 134 10*3/uL — ABNORMAL LOW (ref 150–400)
RBC: 4.45 MIL/uL (ref 4.22–5.81)
RDW: 14.2 % (ref 11.5–15.5)
WBC: 16.9 10*3/uL — ABNORMAL HIGH (ref 4.0–10.5)
nRBC: 0 % (ref 0.0–0.2)

## 2020-12-10 LAB — CREATININE, SERUM
Creatinine, Ser: 0.96 mg/dL (ref 0.61–1.24)
GFR, Estimated: >60 mL/min (ref >60)

## 2020-12-10 LAB — LACTIC ACID, PLASMA
Lactic Acid, Venous: 1.1 mmol/L (ref 0.5–1.9)
Lactic Acid, Venous: 1.3 mmol/L (ref 0.5–1.9)

## 2020-12-10 LAB — GLUCOSE, CAPILLARY: Glucose-Capillary: 423 mg/dL — ABNORMAL HIGH (ref 70–99)

## 2020-12-10 MED ORDER — SERTRALINE HCL 50 MG PO TABS
50.0000 mg | ORAL_TABLET | Freq: Every day | ORAL | Status: DC
  Administered 2020-12-11 – 2020-12-12 (×2): 50 mg via ORAL
  Filled 2020-12-10 (×2): qty 1

## 2020-12-10 MED ORDER — HEPARIN SODIUM (PORCINE) 5000 UNIT/ML IJ SOLN
5000.0000 [IU] | Freq: Three times a day (TID) | INTRAMUSCULAR | Status: DC
  Administered 2020-12-11 – 2020-12-12 (×5): 5000 [IU] via SUBCUTANEOUS
  Filled 2020-12-10 (×5): qty 1

## 2020-12-10 MED ORDER — INSULIN ASPART 100 UNIT/ML IJ SOLN: 0.0000 [IU] | Freq: Three times a day (TID) | INTRAMUSCULAR | Status: DC

## 2020-12-10 MED ORDER — HYDRALAZINE HCL 25 MG PO TABS
50.0000 mg | ORAL_TABLET | Freq: Two times a day (BID) | ORAL | Status: DC
  Administered 2020-12-11 – 2020-12-12 (×4): 50 mg via ORAL
  Filled 2020-12-10 (×4): qty 2

## 2020-12-10 MED ORDER — METOPROLOL SUCCINATE ER 25 MG PO TB24
25.0000 mg | ORAL_TABLET | Freq: Every day | ORAL | Status: DC
  Administered 2020-12-11 – 2020-12-12 (×2): 25 mg via ORAL
  Filled 2020-12-10 (×2): qty 1

## 2020-12-10 MED ORDER — SODIUM CHLORIDE 0.45 % IV SOLN: INTRAVENOUS | Status: DC

## 2020-12-10 MED ORDER — LOSARTAN POTASSIUM 50 MG PO TABS: 100.0000 mg | ORAL_TABLET | Freq: Every day | ORAL | Status: DC

## 2020-12-10 MED ORDER — CLOPIDOGREL BISULFATE 75 MG PO TABS
75.0000 mg | ORAL_TABLET | Freq: Every day | ORAL | Status: DC
  Administered 2020-12-11 – 2020-12-12 (×2): 75 mg via ORAL
  Filled 2020-12-10 (×2): qty 1

## 2020-12-10 MED ORDER — SODIUM CHLORIDE 0.9 % IV SOLN
500.0000 mg | INTRAVENOUS | Status: DC
  Administered 2020-12-10 – 2020-12-11 (×2): 500 mg via INTRAVENOUS
  Filled 2020-12-10 (×2): qty 500

## 2020-12-10 MED ORDER — METHYLPREDNISOLONE SODIUM SUCC 125 MG IJ SOLR
60.0000 mg | Freq: Three times a day (TID) | INTRAMUSCULAR | Status: DC
  Administered 2020-12-11 (×2): 60 mg via INTRAVENOUS
  Filled 2020-12-10 (×2): qty 2

## 2020-12-10 MED ORDER — ATORVASTATIN CALCIUM 20 MG PO TABS
20.0000 mg | ORAL_TABLET | Freq: Every day | ORAL | Status: DC
  Administered 2020-12-11 – 2020-12-12 (×2): 20 mg via ORAL
  Filled 2020-12-10 (×2): qty 1

## 2020-12-10 MED ORDER — LEVALBUTEROL HCL 0.63 MG/3ML IN NEBU
0.6300 mg | INHALATION_SOLUTION | Freq: Four times a day (QID) | RESPIRATORY_TRACT | Status: DC
  Administered 2020-12-10 – 2020-12-11 (×2): 0.63 mg via RESPIRATORY_TRACT
  Filled 2020-12-10 (×3): qty 3

## 2020-12-10 MED ORDER — TORSEMIDE 20 MG PO TABS
20.0000 mg | ORAL_TABLET | Freq: Every day | ORAL | Status: DC
  Administered 2020-12-11: 20 mg via ORAL
  Filled 2020-12-10: qty 1

## 2020-12-10 MED ORDER — ALLOPURINOL 300 MG PO TABS
300.0000 mg | ORAL_TABLET | Freq: Every day | ORAL | Status: DC
  Administered 2020-12-11 – 2020-12-12 (×2): 300 mg via ORAL
  Filled 2020-12-10 (×2): qty 1

## 2020-12-10 MED ORDER — LACTATED RINGERS IV BOLUS (SEPSIS)
1000.0000 mL | Freq: Once | INTRAVENOUS | Status: AC
  Administered 2020-12-10: 1000 mL via INTRAVENOUS

## 2020-12-10 MED ORDER — LACTATED RINGERS IV SOLN: INTRAVENOUS | Status: AC

## 2020-12-10 MED ORDER — AMLODIPINE BESYLATE 5 MG PO TABS
10.0000 mg | ORAL_TABLET | Freq: Every day | ORAL | Status: DC
  Administered 2020-12-11 – 2020-12-12 (×2): 10 mg via ORAL
  Filled 2020-12-10 (×2): qty 2

## 2020-12-10 MED ORDER — ACETAMINOPHEN 500 MG PO TABS
1000.0000 mg | ORAL_TABLET | Freq: Once | ORAL | Status: AC
  Administered 2020-12-10: 16:00:00 1000 mg via ORAL
  Filled 2020-12-10: qty 2

## 2020-12-10 MED ORDER — MAGNESIUM SULFATE 2 GM/50ML IV SOLN
2.0000 g | Freq: Once | INTRAVENOUS | Status: AC
  Administered 2020-12-10: 16:00:00 2 g via INTRAVENOUS
  Filled 2020-12-10: qty 50

## 2020-12-10 MED ORDER — NICOTINE 21 MG/24HR TD PT24
21.0000 mg | MEDICATED_PATCH | Freq: Every day | TRANSDERMAL | Status: DC
  Filled 2020-12-10 (×3): qty 1

## 2020-12-10 MED ORDER — SODIUM CHLORIDE 0.9 % IV SOLN
2.0000 g | INTRAVENOUS | Status: DC
  Administered 2020-12-10 – 2020-12-11 (×2): 2 g via INTRAVENOUS
  Filled 2020-12-10 (×2): qty 20

## 2020-12-10 NOTE — Sepsis Progress Note (Signed)
Sepsis protocol being followed by [facility]

## 2020-12-10 NOTE — ED Notes (Signed)
Patient is being discharged from the Urgent Care and sent to the Emergency Department via ems . Per Molli Barrows, FNP, patient is in need of higher level of care due to hypoxia . Patient is aware and verbalizes understanding of plan of care.  Vitals:   12/10/20 1435 12/10/20 1440  BP: 119/69   Pulse: (!) 115   Resp: (!) 26   Temp: 97.9 F (36.6 C)   SpO2: (!) 81% 91%

## 2020-12-10 NOTE — ED Notes (Signed)
ED TO INPATIENT HANDOFF REPORT  ED Nurse Name and Phone #:   S Name/Age/Gender Kevin Frank 80 y.o. male Room/Bed: APA18/APA18  Code Status   Code Status: Full Code  Home/SNF/Other Home Patient oriented to: self, place, time and situation Is this baseline? Yes   Triage Complete: Triage complete  Chief Complaint Acute respiratory failure ([place]) [J96.00]  Triage Note Pt brought in [facility] for c/o sob for the last couple of days; pt was seen at urgent care today for sob and fever; pt was in 80's for O2 sats; pt given nebs en route by ems; pt given solumedrol 125mg  IV by ems; pt had covid tests at home and were all negative; cbg 222    Allergies Allergies  Allergen Reactions  . No Known Allergies     Level of Care/Admitting Diagnosis ED Disposition    ED Disposition  Admit   Condition  --   [place]: [facility] [[id number]]  Level of Care: Med-Surg [16]  Covid Evaluation: Symptomatic Person Under Investigation (PUI)  Diagnosis: Acute respiratory failure ([place]) [518.81.ICD-9-CM]  Admitting Physician: Waldemar Dickens [phone]  Attending Physician: Marily Memos, DAVID J [phone]  Estimated length of stay: past midnight tomorrow  Certification:: I certify this patient will need inpatient services for at least 2 midnights         B Medical/Surgery History Past Medical History:  Diagnosis Date  . AAA (abdominal aortic aneurysm) ([place]) 01/2008   3.2cm  . Allergy   . Arthritis   . Asthma   . BPH (benign prostatic hyperplasia)   . Chronic knee pain    bilateral  . COPD (chronic obstructive pulmonary disease) ([place])   . Diabetes mellitus    type II  . Gout   . History of kidney stones   . Hypercholesterolemia   . Hyperlipidemia   . Hypertension   . 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 Normal nuclear stress test 2011  . Obesity   . Obstructive sleep apnea on CPAP   . Presence of indwelling urinary  catheter   . Shortness of breath    due to cigarette abuse  . Tobacco abuse    Past Surgical History:  Procedure Laterality Date  . ABDOMINAL AORTIC ENDOVASCULAR FENESTRATED STENT GRAFT N/A 04/09/2016   Procedure: ABDOMINAL AORTIC ENDOVASCULAR FENESTRATED STENT GRAFT with left brachial artery access using ultrasound guidance;  Surgeon: Serafina Mitchell, MD;  Location: [facility];  Service: Vascular;  Laterality: N/A;  . [place]   [place]  . COLONOSCOPY  2006   2006: multiple diminutive polyps in rectum, one pedunculated polyp at 10 cm s/p snare removal. Left-sided diverticulum, pedunculated polyps at 35 cm and splenic flexure s/p snare removal. No path available.   . COLONOSCOPY WITH PROPOFOL N/A 11/24/2019   Procedure: COLONOSCOPY WITH PROPOFOL;  Surgeon: Daneil Dolin, MD;  [facility];  Service: Endoscopy;  Laterality: N/A;  9:45am  . PERIPHERAL VASCULAR CATHETERIZATION N/A 04/10/2016   Procedure: Renal Angiography;  Surgeon: Angelia Mould, MD;  Location: [facility];  Service: Cardiovascular;  Laterality: N/A;  . POLYPECTOMY  11/24/2019   Procedure: POLYPECTOMY;  Surgeon: Daneil Dolin, MD;  [facility];  Service: Endoscopy;;  . TRANSURETHRAL RESECTION OF PROSTATE  01/07/2011   Procedure: TRANSURETHRAL RESECTION OF THE PROSTATE (TURP);  Surgeon: Marissa Nestle;  Location[facility];  Service: Urology;  Laterality: N/A;     A IV Location/Drains/Wounds Patient Lines/Drains/Airways Status    Active Line/Drains/Airways    Name Placement date Placement time Site Days   Peripheral IV 12/10/20 18 G Left Antecubital 12/10/20  1532  Antecubital  less than 1   Peripheral IV 12/10/20 22 G Right Hand 12/10/20  1650  Hand  less than 1   External Urinary Catheter 12/10/20  1628  --  less than 1   Incision (Closed) 03/19/18 Back Mid;Upper 03/19/18  1435  -- 997          Intake/Output Last 24 hours  Intake/Output Summary (Last 24 hours) at  12/10/2020 2118 Last data filed at 12/10/2020 1933 Gross per 24 hour  Intake 1000 ml  Output --  Net 1000 ml    Labs/Imaging Results for orders placed or performed during the hospital encounter of 12/10/20 (from the past 48 hour(s))  Resp Panel by RT-PCR (Flu A&B, Covid) Nasopharyngeal Swab     Status: None   Collection Time: 12/10/20  3:16 PM   Specimen: Nasopharyngeal Swab; Nasopharyngeal(NP) swabs in vial transport medium  Result Value Ref Range   SARS Coronavirus 2 by RT PCR NEGATIVE NEGATIVE    Comment: (NOTE) SARS-CoV-2 target nucleic acids are NOT DETECTED.  The SARS-CoV-2 RNA is generally detectable in upper respiratory specimens during the acute phase of infection. The lowest concentration of SARS-CoV-2 viral copies this assay can detect is 138 copies/mL. A negative result does not preclude SARS-Cov-2 infection and should not be used as the sole basis for treatment or other patient management decisions. A negative result may occur with  improper specimen collection/handling, submission of specimen other than nasopharyngeal swab, presence of viral mutation(s) within the areas targeted by this assay, and inadequate number of viral copies(<138 copies/mL). A negative result must be combined with clinical observations, patient history, and epidemiological information. The expected result is Negative.  Fact Sheet for Patients:  EntrepreneurPulse.com.au  Fact Sheet for Healthcare Providers:  IncredibleEmployment.be  This test is no t yet approved or cleared by the [place] FDA and  has been authorized for detection and/or diagnosis of SARS-CoV-2 by FDA under an Emergency Use Authorization (EUA). This EUA will remain  in effect (meaning this test can be used) for the duration of the COVID-19 declaration under Section 564(b)(1) of the Act, 21 U.S.C.section 360bbb-3(b)(1), unless the authorization is terminated  or revoked sooner.        Influenza A by PCR NEGATIVE NEGATIVE   Influenza B by PCR NEGATIVE NEGATIVE    Comment: (NOTE) The Xpert Xpress SARS-CoV-2/FLU/RSV plus assay is intended as an aid in the diagnosis of influenza from Nasopharyngeal swab specimens and should not be used as a sole basis for treatment. Nasal washings and aspirates are unacceptable for Xpert Xpress SARS-CoV-2/FLU/RSV testing.  Fact Sheet for Patients: EntrepreneurPulse.com.au  Fact Sheet for Healthcare Providers: IncredibleEmployment.be  This test is not yet approved or cleared by the [place] FDA and has been authorized for detection and/or diagnosis of SARS-CoV-2 by FDA under an Emergency Use Authorization (EUA). This EUA will remain in effect (meaning this test can be used) for the duration of the COVID-19 declaration under Section 564(b)(1) of the Act, 21 U.S.C. section 360bbb-3(b)(1), unless the authorization is terminated or revoked.  Performed at [facility], [address] [place], [place] [address]   Comprehensive metabolic panel     Status: Abnormal   Collection Time: 12/10/20  3:58 PM  Result Value Ref Range  Sodium 131 (L) 135 - 145 mmol/L   Potassium 3.4 (L) 3.5 - 5.1 mmol/L   Chloride 97 (L) 98 - 111 mmol/L   CO2 24 22 - 32 mmol/L   Glucose, Bld 219 (H) 70 - 99 mg/dL    Comment: Glucose reference range applies only to samples taken after fasting for at least 8 hours.   BUN 12 8 - 23 mg/dL   Creatinine, Ser 0.86 0.61 - 1.24 mg/dL   Calcium 8.9 8.9 - 10.3 mg/dL   Total Protein 7.0 6.5 - 8.1 g/dL   Albumin 3.3 (L) 3.5 - 5.0 g/dL   AST 19 15 - 41 U/L   ALT 19 0 - 44 U/L   Alkaline Phosphatase 87 38 - 126 U/L   Total Bilirubin 0.7 0.3 - 1.2 mg/dL   GFR, Estimated >60 >60 mL/min    Comment: (NOTE) Calculated using the CKD-EPI Creatinine Equation (2021)    Anion gap 10 5 - 15    Comment: Performed at [facility], [address], [place], [place] [address]  CBC with  Differential     Status: Abnormal   Collection Time: 12/10/20  3:58 PM  Result Value Ref Range   WBC 18.0 (H) 4.0 - 10.5 K/uL   RBC 4.66 4.22 - 5.81 MIL/uL   Hemoglobin 14.9 13.0 - 17.0 g/dL   HCT 43.6 39.0 - 52.0 %   MCV 93.6 80.0 - 100.0 fL   MCH 32.0 26.0 - 34.0 pg   MCHC 34.2 30.0 - 36.0 g/dL   RDW 14.0 11.5 - 15.5 %   Platelets 147 (L) 150 - 400 K/uL   nRBC 0.0 0.0 - 0.2 %   Neutrophils Relative % 84 %   Neutro Abs 15.1 (H) 1.7 - 7.7 K/uL   Lymphocytes Relative 6 %   Lymphs Abs 1.1 0.7 - 4.0 K/uL   Monocytes Relative 8 %   Monocytes Absolute 1.5 (H) 0.1 - 1.0 K/uL   Eosinophils Relative 1 %   Eosinophils Absolute 0.1 0.0 - 0.5 K/uL   Basophils Relative 0 %   Basophils Absolute 0.1 0.0 - 0.1 K/uL   Immature Granulocytes 1 %   Abs Immature Granulocytes 0.15 (H) 0.00 - 0.07 K/uL    Comment: Performed at [facility], [address] [place], [place] [address]  Lactic acid, plasma     Status: None   Collection Time: 12/10/20  3:58 PM  Result Value Ref Range   Lactic Acid, Venous 1.1 0.5 - 1.9 mmol/L    Comment: Performed at [facility], [address] [place], [place] [address]  Blood culture (routine x 2)     Status: None (Preliminary result)   Collection Time: 12/10/20  3:58 PM   Specimen: BLOOD  Result Value Ref Range   Specimen Description BLOOD BLOOD LEFT HAND    Special Requests      Blood Culture adequate volume BOTTLES DRAWN AEROBIC AND ANAEROBIC Performed at [facility], [address], [place], [place] [address]    Culture PENDING    Report Status PENDING   Blood culture (routine x 2)     Status: None (Preliminary result)   Collection Time: 12/10/20  3:58 PM   Specimen: BLOOD  Result Value Ref Range   Specimen Description BLOOD BLOOD RIGHT ARM    Special Requests      Blood Culture adequate volume BOTTLES DRAWN AEROBIC AND ANAEROBIC Performed at [facility], [address] [place], [place] [address]    Culture  PENDING    Report Status PENDING   Lactic  acid, plasma     Status: None   Collection Time: 12/10/20  6:05 PM  Result Value Ref Range   Lactic Acid, Venous 1.3 0.5 - 1.9 mmol/L    Comment: Performed at [facility], [address], [place], [place] [address]  CBC     Status: Abnormal   Collection Time: 12/10/20  8:02 PM  Result Value Ref Range   WBC 16.9 (H) 4.0 - 10.5 K/uL   RBC 4.45 4.22 - 5.81 MIL/uL   Hemoglobin 14.3 13.0 - 17.0 g/dL   HCT 42.1 39.0 - 52.0 %   MCV 94.6 80.0 - 100.0 fL   MCH 32.1 26.0 - 34.0 pg   MCHC 34.0 30.0 - 36.0 g/dL   RDW 14.2 11.5 - 15.5 %   Platelets 134 (L) 150 - 400 K/uL   nRBC 0.0 0.0 - 0.2 %    Comment: Performed at [facility], [address] [place], [place] [address]  Creatinine, serum     Status: None   Collection Time: 12/10/20  8:02 PM  Result Value Ref Range   Creatinine, Ser 0.96 0.61 - 1.24 mg/dL   GFR, Estimated >60 >60 mL/min    Comment: (NOTE) Calculated using the CKD-EPI Creatinine Equation (2021) Performed at [facility], [address], [place],  [address]    DG Chest Portable 1 View  Result Date: 12/10/2020 CLINICAL DATA:  Fever and shortness of breath for 4 days. History of COPD and lung carcinoma. EXAM: PORTABLE CHEST 1 VIEW COMPARISON:  01/18/2019 and older exams.  Chest CT, 10/25/2018. FINDINGS: There are bilateral interstitial opacities, most prominent in the left upper lobe, increased compared to the chest radiograph from 01/18/2019, but similar to the more recent prior chest CT. No focal lung consolidation. Lungs are hyperexpanded. No pleural effusion or pneumothorax. Heart is normal in size.  No mediastinal or hilar masses. Skeletal structures are grossly intact. IMPRESSION: 1. Bilateral interstitial lung opacities that are most evident in the left upper lobe. Findings appears similar to the topogram from the chest CT dated 10/24/2020 and may all be chronic. Acute interstitial infection, particularly in the left upper lobe, however, should be considered given the  patient's symptoms. 2. Underlying COPD/emphysema. Electronically Signed   By: Lajean Manes M.D.   On: 12/10/2020 15:58    Pending Labs Unresulted Labs (From admission, onward)    Start     Ordered   12/11/20 0500  Comprehensive metabolic panel  Tomorrow morning,   R        12/10/20 1932   12/11/20 0500  CBC  Tomorrow morning,   R        12/10/20 1932   12/10/20 1937  Hemoglobin A1c  Once,   STAT       Comments: To assess prior glycemic control    12/10/20 1936   12/10/20 1927  HIV Antibody (routine testing w rflx)  (HIV Antibody (Routine testing w reflex) panel)  Once,   STAT        12/10/20 1932   12/10/20 1654  Urinalysis, Routine w reflex microscopic Urine, Clean Catch  ONCE - STAT,   STAT        12/10/20 1653   12/10/20 1654  Urine culture  ONCE - STAT,   STAT        12/10/20 1653          Vitals/Pain Today's Vitals   12/10/20 1730 12/10/20 1800  12/10/20 1830 12/10/20 1900  BP: (!) 119/55 119/62 (!) 116/58 (!) 122/52  Pulse: 85 87 80 79  Resp: (!) 25 (!) 24 (!) 24 20  Temp:      TempSrc:      SpO2: 94% 93% 95% 96%  Weight:      Height:      PainSc:        Isolation Precautions No active isolations  Medications Medications  lactated ringers infusion (has no administration in time range)  cefTRIAXone (ROCEPHIN) 2 g in sodium chloride 0.9 % 100 mL IVPB (0 g Intravenous Stopped 12/10/20 1901)  azithromycin (ZITHROMAX) 500 mg in sodium chloride 0.9 % 250 mL IVPB (0 mg Intravenous Stopped 12/10/20 1901)  allopurinol (ZYLOPRIM) tablet 300 mg (has no administration in time range)  amLODipine (NORVASC) tablet 10 mg (has no administration in time range)  atorvastatin (LIPITOR) tablet 20 mg (has no administration in time range)  clopidogrel (PLAVIX) tablet 75 mg (has no administration in time range)  hydrALAZINE (APRESOLINE) tablet 50 mg (has no administration in time range)  losartan (COZAAR) tablet 100 mg (has no administration in time range)  metoprolol succinate  (TOPROL-XL) 24 hr tablet 25 mg (has no administration in time range)  sertraline (ZOLOFT) tablet 50 mg (has no administration in time range)  torsemide (DEMADEX) tablet 20 mg (has no administration in time range)  heparin injection 5,000 Units (has no administration in time range)  0.45 % sodium chloride infusion (has no administration in time range)  methylPREDNISolone sodium succinate (SOLU-MEDROL) 125 mg/2 mL injection 60 mg (has no administration in time range)  levalbuterol (XOPENEX) nebulizer solution 0.63 mg (has no administration in time range)  nicotine (NICODERM CQ - dosed in mg/24 hours) patch 21 mg (has no administration in time range)  insulin aspart (novoLOG) injection 0-6 Units (has no administration in time range)  magnesium sulfate IVPB 2 g 50 mL (0 g Intravenous Stopped 12/10/20 1650)  acetaminophen (TYLENOL) tablet 1,000 mg (1,000 mg Oral Given 12/10/20 1559)  lactated ringers bolus 1,000 mL (0 mLs Intravenous Stopped 12/10/20 1933)    Mobility walks Low fall risk   Focused Assessments    R Recommendations: See Admitting Provider Note  Report given to:   Additional Notes:

## 2020-12-10 NOTE — ED Triage Notes (Addendum)
Pt has had a fever,  and shortness of breath  since Friday. 2 neg home covid test.

## 2020-12-10 NOTE — Progress Notes (Signed)
[facility] continues to follow for sepsis protocol

## 2020-12-10 NOTE — ED Triage Notes (Signed)
Pt brought in [facility] for c/o sob for the last couple of days; pt was seen at urgent care today for sob and fever; pt was in 80's for O2 sats; pt given nebs en route by ems; pt given solumedrol 125mg  IV by ems; pt had covid tests at home and were all negative; cbg 222

## 2020-12-10 NOTE — H&P (Signed)
History and Physical    Kevin Frank BWG:[id number] DOB: Nov 15, 1940 DOA: 12/10/2020  PCP: Sharilyn Sites, MD Patient coming from: Urgent Care  Chief Complaint: Cough and feeling poorly  HPI: Kevin Frank is a 80 y.o. male with medical history significant of AAA, COPD/Asthma, Gout, DM, HTN, HLD, nephrolithiasis, Stage 1 SCC  RUL lung. Pt presenting w/ 3-4 day h/o cough and progressive SOB.  Went to [facility] today at [employer] of family and noted to have O2 in the 80s. Given Solumedrol and Duonebs enroute to ED w/ improvement. Remains on O2 in order to maintain saturations above 90%. Associated w/ sugjective fevers and chills.      ED Course: COIVID NEGATIVE.   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    Normal nuclear stress test 2011   Obesity    Obstructive sleep apnea on CPAP    Presence of indwelling urinary catheter    Shortness of breath    due to cigarette abuse   Tobacco abuse     Past Surgical History:  Procedure Laterality Date   ABDOMINAL AORTIC ENDOVASCULAR FENESTRATED STENT GRAFT N/A 04/09/2016   Procedure: ABDOMINAL AORTIC ENDOVASCULAR FENESTRATED STENT GRAFT with left brachial artery access using ultrasound guidance;  Surgeon: Serafina Mitchell, MD;  Location: [place] OR;  Service: Vascular;  Laterality: N/A;   [place]   [facility]   COLONOSCOPY  2006   2006: multiple diminutive polyps in rectum, one  pedunculated polyp at 10 cm s/p snare removal. Left-sided diverticulum, pedunculated polyps at 35 cm and splenic flexure s/p snare removal. No path available.    COLONOSCOPY WITH PROPOFOL N/A 11/24/2019   Procedure: COLONOSCOPY WITH PROPOFOL;  Surgeon: Daneil Dolin, MD;  [facility];  Service: Endoscopy;  Laterality: N/A;  9:45am   PERIPHERAL VASCULAR CATHETERIZATION N/A 04/10/2016   Procedure: Renal Angiography;  Surgeon: Angelia Mould, MD;  Location: [place] [facility] LAB;  Service: Cardiovascular;  Laterality: N/A;   POLYPECTOMY  11/24/2019   Procedure: POLYPECTOMY;  Surgeon: Daneil Dolin, MD;  Location: [facility];  Service: Endoscopy;;   TRANSURETHRAL RESECTION OF PROSTATE  01/07/2011   Procedure: TRANSURETHRAL RESECTION OF THE PROSTATE (TURP);  Surgeon: Marissa Nestle;  Location: [facility] OR[facility];  Service: Urology;  Laterality: N/A;    Social History   Socioeconomic History   Marital status: Married    Spouse name: Not on file   Number of children: Not on file   Years of education: Not on file   Highest education level: Not on file  Occupational History   Not on file  Tobacco Use   Smoking status: Every Day    Packs/day: 3.00    Years: 50.00    Pack years: 150.00    Types: Cigarettes   Smokeless tobacco: Never   Tobacco  comments:    2ppd 10/30/2016 ee  Vaping Use   Vaping Use: Never used  Substance and Sexual Activity   Alcohol use: Yes    Alcohol/week: 0.0 standard drinks    Comment: very little   Drug use: No   Sexual activity: Not on file  Other Topics Concern   Not on file  Social History Narrative   Not on file   Social Determinants of Health   Financial Resource Strain: Not on file  Food Insecurity: Not on file  Transportation Needs: Not on file  Physical Activity: Not on file  Stress: Not on file  Social Connections: Not on file  Intimate Partner Violence: Not on file    Allergies  Allergen Reactions   No Known Allergies     Family  History  Problem Relation Age of Onset   Liver disease Father    Lymphoma Mother    Diabetes Mother    Hypertension Mother    Cancer Mother    Mental retardation Other        sibling -- from birth    Asthma Daughter    Allergic rhinitis Neg Hx    Angioedema Neg Hx    Atopy Neg Hx    Eczema Neg Hx    Immunodeficiency Neg Hx    Colon cancer Neg Hx       Prior to Admission medications   Medication Sig Start Date End Date Taking? Authorizing Provider  albuterol (VENTOLIN HFA) 108 (90 Base) MCG/ACT inhaler Use 4 puffs every 4-6 hours as needed for cough or wheeze 06/13/20  Yes Althea Charon, FNP  allopurinol (ZYLOPRIM) 300 MG tablet Take 1 tablet (300 mg total) by mouth daily. 09/07/19  Yes Corum, Rex Kras, MD  amLODipine (NORVASC) 10 MG tablet Take 1 tablet (10 mg total) by mouth daily. 09/06/19  Yes Burtis Junes, NP  atorvastatin (LIPITOR) 20 MG tablet Take 1 tablet (20 mg total) by mouth daily. 08/24/19  Yes Corum, Rex Kras, MD  Budeson-Glycopyrrol-Formoterol (BREZTRI AEROSPHERE) 160-9-4.8 MCG/ACT AERO Inhale 2 puffs into the lungs 2 (two) times daily. 12/06/20  Yes Valentina Shaggy, MD  clopidogrel (PLAVIX) 75 MG tablet Take 1 tablet (75 mg total) by mouth daily. 09/07/19  Yes Corum, Rex Kras, MD  hydrALAZINE (APRESOLINE) 50 MG tablet TAKE (1) TABLET TWICE DAILY. Patient taking differently: Take 50 mg by mouth in the morning and at bedtime. 01/23/20  Yes Burtis Junes, NP  losartan (COZAAR) 100 MG tablet TAKE 1 TABLET BY MOUTH ONCE DAILY. Patient taking differently: Take 100 mg by mouth daily. 10/10/19  Yes Corum, Rex Kras, MD  metFORMIN (GLUCOPHAGE) 500 MG tablet Take 1,000 mg by mouth 2 (two) times daily with a meal.  05/31/16  Yes [provider]  metoprolol succinate (TOPROL-XL) 25 MG 24 hr tablet Take 1 tablet (25 mg total) by mouth daily. 09/06/19  Yes Burtis Junes, NP  sertraline (ZOLOFT) 50 MG tablet Take 1 tablet (50 mg total) by mouth daily. 08/29/19  Yes Corum, Rex Kras,  MD  torsemide (DEMADEX) 20 MG tablet Take 20 mg by mouth daily.   Yes [provider]  polyethylene glycol-electrolytes (TRILYTE) 420 g solution Take 4,000 mLs by mouth as directed. 08/25/19   Daneil Dolin, MD    Physical Exam: Vitals:   12/10/20 1730 12/10/20 1800 12/10/20 1830 12/10/20 1900  BP: (!) 119/55 119/62 (!) 116/58 (!) 122/52  Pulse: 85 87 80 79  Resp: (!) 25 (!) 24 (!)  24 20  Temp:      TempSrc:      SpO2: 94% 93% 95% 96%  Weight:      Height:         General: Ill appearing. Appears stated age.  Eyes:  PERRL, EOMI, normal lids, iris ENT:  Very hard of hearing, lips & tongue, mmm Neck:  no LAD, masses or thyromegaly Cardiovascular:  RRR, no m/r/g. No LE edema.  Respiratory: Diffuse wheezing and ronchi w/ increased effort on O2.  Abdomen:  soft, ntnd, NABS Skin:  no rash or induration seen on limited exam Musculoskeletal:  grossly normal tone BUE/BLE, good ROM, no bony abnormality Psychiatric:  grossly normal mood and affect, speech fluent and appropriate, AOx3 Neurologic:  CN 2-12 grossly intact, moves all extremities in coordinated fashion, sensation intact  Labs on Admission: I have personally reviewed following labs and imaging studies  CBC: Recent Labs  Lab 12/10/20 1558  WBC 18.0*  NEUTROABS 15.1*  HGB 14.9  HCT 43.6  MCV 93.6  PLT 161*   Basic Metabolic Panel: Recent Labs  Lab 12/10/20 1558  NA 131*  K 3.4*  CL 97*  CO2 24  GLUCOSE 219*  BUN 12  CREATININE 0.86  CALCIUM 8.9   GFR: Estimated Creatinine Clearance: 83.5 mL/min (by C-G formula based on SCr of 0.86 mg/dL). Liver Function Tests: Recent Labs  Lab 12/10/20 1558  AST 19  ALT 19  ALKPHOS 87  BILITOT 0.7  PROT 7.0  ALBUMIN 3.3*   No results for input(s): LIPASE, AMYLASE in the last 168 hours. No results for input(s): AMMONIA in the last 168 hours. Coagulation Profile: No results for input(s): INR, PROTIME in the last 168 hours. Cardiac Enzymes: No results for  input(s): CKTOTAL, CKMB, CKMBINDEX, TROPONINI in the last 168 hours. BNP (last 3 results) No results for input(s): PROBNP in the last 8760 hours. HbA1C: No results for input(s): HGBA1C in the last 72 hours. CBG: No results for input(s): GLUCAP in the last 168 hours. Lipid Profile: No results for input(s): CHOL, HDL, LDLCALC, TRIG, CHOLHDL, LDLDIRECT in the last 72 hours. Thyroid Function Tests: No results for input(s): TSH, T4TOTAL, FREET4, T3FREE, THYROIDAB in the last 72 hours. Anemia Panel: No results for input(s): VITAMINB12, FOLATE, FERRITIN, TIBC, IRON, RETICCTPCT in the last 72 hours. Urine analysis:    Component Value Date/Time   COLORURINE YELLOW 04/02/2016 [place] 04/02/2016 1037   LABSPEC 1.006 04/02/2016 1037   PHURINE 6.0 04/02/2016 1037   GLUCOSEU NEGATIVE 04/02/2016 1037   HGBUR NEGATIVE 04/02/2016 1037   BILIRUBINUR NEGATIVE 04/02/2016 1037   KETONESUR NEGATIVE 04/02/2016 1037   PROTEINUR NEGATIVE 04/02/2016 1037   NITRITE NEGATIVE 04/02/2016 1037   LEUKOCYTESUR SMALL (A) 04/02/2016 1037    Creatinine Clearance: Estimated Creatinine Clearance: 83.5 mL/min (by C-G formula based on SCr of 0.86 mg/dL).  Sepsis Labs: @LABRCNTIP (procalcitonin:4,lacticidven:4) ) Recent Results (from the past 240 hour(s))  Resp Panel by RT-PCR (Flu A&B, Covid) Nasopharyngeal Swab     Status: None   Collection Time: 12/10/20  3:16 PM   Specimen: Nasopharyngeal Swab; Nasopharyngeal(NP) swabs in vial transport medium  Result Value Ref Range Status   SARS Coronavirus 2 by RT PCR NEGATIVE NEGATIVE Final    Comment: (NOTE) SARS-CoV-2 target nucleic acids are NOT DETECTED.  The SARS-CoV-2 RNA is generally detectable in upper respiratory specimens during the acute phase of infection. The lowest concentration of SARS-CoV-2 viral copies this assay can detect is 138 copies/mL. A negative result  does not preclude SARS-Cov-2 infection and should not be used as the sole  basis for treatment or other patient management decisions. A negative result may occur with  improper specimen collection/handling, submission of specimen other than nasopharyngeal swab, presence of viral mutation(s) within the areas targeted by this assay, and inadequate number of viral copies(<138 copies/mL). A negative result must be combined with clinical observations, patient history, and epidemiological information. The expected result is Negative.  Fact Sheet for Patients:  EntrepreneurPulse.com.au  Fact Sheet for Healthcare Providers:  IncredibleEmployment.be  This test is no t yet approved or cleared by the [place] FDA and  has been authorized for detection and/or diagnosis of SARS-CoV-2 by FDA under an Emergency Use Authorization (EUA). This EUA will remain  in effect (meaning this test can be used) for the duration of the COVID-19 declaration under Section 564(b)(1) of the Act, 21 U.S.C.section 360bbb-3(b)(1), unless the authorization is terminated  or revoked sooner.       Influenza A by PCR NEGATIVE NEGATIVE Final   Influenza B by PCR NEGATIVE NEGATIVE Final    Comment: (NOTE) The Xpert Xpress SARS-CoV-2/FLU/RSV plus assay is intended as an aid in the diagnosis of influenza from Nasopharyngeal swab specimens and should not be used as a sole basis for treatment. Nasal washings and aspirates are unacceptable for Xpert Xpress SARS-CoV-2/FLU/RSV testing.  Fact Sheet for Patients: EntrepreneurPulse.com.au  Fact Sheet for Healthcare Providers: IncredibleEmployment.be  This test is not yet approved or cleared by the [place] FDA and has been authorized for detection and/or diagnosis of SARS-CoV-2 by FDA under an Emergency Use Authorization (EUA). This EUA will remain in effect (meaning this test can be used) for the duration of the COVID-19 declaration under Section 564(b)(1) of the Act,  21 U.S.C. section 360bbb-3(b)(1), unless the authorization is terminated or revoked.  Performed at [facility], [address] [place], [place] [address]   Blood culture (routine x 2)     Status: None (Preliminary result)   Collection Time: 12/10/20  3:58 PM   Specimen: BLOOD  Result Value Ref Range Status   Specimen Description BLOOD BLOOD LEFT HAND  Final   Special Requests   Final    Blood Culture adequate volume BOTTLES DRAWN AEROBIC AND ANAEROBIC Performed at [facility], [address] [place], [place] [address]    Culture PENDING  Incomplete   Report Status PENDING  Incomplete  Blood culture (routine x 2)     Status: None (Preliminary result)   Collection Time: 12/10/20  3:58 PM   Specimen: BLOOD  Result Value Ref Range Status   Specimen Description BLOOD BLOOD RIGHT ARM  Final   Special Requests   Final    Blood Culture adequate volume BOTTLES DRAWN AEROBIC AND ANAEROBIC Performed at [facility], [address], [place], [place] [address]    Culture PENDING  Incomplete   Report Status PENDING  Incomplete     Radiological Exams on Admission: DG Chest Portable 1 View  Result Date: 12/10/2020 CLINICAL DATA:  Fever and shortness of breath for 4 days. History of COPD and lung carcinoma. EXAM: PORTABLE CHEST 1 VIEW COMPARISON:  01/18/2019 and older exams.  Chest CT, 10/25/2018. FINDINGS: There are bilateral interstitial opacities, most prominent in the left upper lobe, increased compared to the chest radiograph from 01/18/2019, but similar to the more recent prior chest CT. No focal lung consolidation. Lungs are hyperexpanded. No pleural effusion or pneumothorax. Heart is normal in size.  No mediastinal or hilar  masses. Skeletal structures are grossly intact. IMPRESSION: 1. Bilateral interstitial lung opacities that are most evident in the left upper lobe. Findings appears similar to the topogram from the chest CT dated 10/24/2020 and may all be chronic. Acute interstitial  infection, particularly in the left upper lobe, however, should be considered given the patient's symptoms. 2. Underlying COPD/emphysema. Electronically Signed   By: Lajean Manes M.D.   On: 12/10/2020 15:58    EKG: Independently reviewed. Sinus, tachy, No ACS  Assessment/Plan Active Problems:   HYPERCHOLESTEROLEMIA   Essential hypertension, benign   AAA (abdominal aortic aneurysm) without rupture (HCC)   AKI (acute kidney injury) (HCC)   Chronic diastolic CHF (congestive heart failure) (HCC)   Asthma-COPD overlap syndrome (HCC)   Squamous cell carcinoma of right lung (HCC)   Diabetes mellitus without complication (HCC)   Acute respiratory failure (HCC)   Acute respiratory failure: h/o COPD, asthma, and Tobacco use. On Xolair infusions. COVID Negative. Likely w/ CAP. Lactic acid nml. Febrile. WBC 18 and febrile.  - Inpt - Wean O2 as able.  - Solumedrol 60mg  Q8, - Xoenex Q6 - Continue Rocephin/Azithro  Lung SCC RUL : Stage IA3 (cT1c, cN0, cM0). Seen by Oncology on 5/11. Cancer is stable. - Surveillance only at this time.   DM: Metformin only. Anticipate rise w/ Steroids. - SSI  AAA: s/p fenestrated stent graft - continue Plavix.   dCHF: Stable - Continue torsemide.   HLD: - continue statin  HTN: Hemodynamically stable. - continue Amlodipine, Hydralazine, Losartan, Metop  Tobacco use: 2ppd - Nicotine patch      DVT prophylaxis: Hep  Code Status: full  Family Communication: none  Disposition Plan: pending improving in respiratory status  Consults called: none  Admission status: inpt    Waldemar Dickens MD [facility]  If 7PM-7AM, please contact night-coverage [email or web address] Password [facility]  12/10/2020, 7:37 PM

## 2020-12-10 NOTE — ED Provider Notes (Signed)
[facility]    CSN: [id number] Arrival date & time: 12/10/20  1430      History   Chief Complaint No chief complaint on file.   HPI Kevin Frank is a 80 y.o. male.   HPI Patient presents today at the direction of his healthcare provider for evaluation of acute 3 days of gradually worsening shortness of breath.  On arrival today patient's oxygen level was 80 and he is tachycardic.  His wife is accompanying him here in clinic today and reports that he has had a cough with progressive shortness of breath over the last 3 days.  He has a history of asthma and COPD along with cardiovascular disease.  He denies any chest pain.  Patient has patient has used his albuterol inhaler about an hour and a half ago without any significant improvement of wheezing or work of breathing.  He is also prescribed a CPAP machine which he uses compliantly however is not on home oxygen.  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    Normal nuclear stress test 2011   Obesity    Obstructive sleep apnea on CPAP    Presence of indwelling urinary catheter    Shortness of breath    due to cigarette abuse   Tobacco abuse     Patient Active Problem List   Diagnosis Date Noted   Wrist pain, acute, right 09/07/2019   Right forearm pain 09/07/2019   Uncontrolled type 2 diabetes mellitus with hyperglycemia ([place]) 06/27/2019   Mixed hyperlipidemia 06/27/2019   Class 1 obesity due to excess calories with serious comorbidity and body mass index (BMI) of 32.0 to 32.9 in adult  06/27/2019   Diabetes mellitus without complication ([place]) 23/76/2831   Positive colorectal cancer screening using Cologuard test 06/14/2019   Squamous cell carcinoma of right lung ([place]) 03/29/2018   Seasonal and perennial allergic rhinitis 09/22/2017   Asthma-COPD overlap syndrome ([place]) 09/22/2017   Obstructive sleep apnea on CPAP 04/23/2017   Chronic knee pain 04/23/2017   AKI (acute kidney injury) ([place]) 04/23/2017   Chronic diastolic CHF (congestive heart failure) ([place]) 04/23/2017   Cellulitis 04/23/2017   Cellulitis of left lower leg    Current smoker 03/05/2016   Incidental lung nodule, > 67mm and < 82mm 03/05/2016   Stage 2 moderate COPD by GOLD classification ([place]) 03/05/2016   AAA (abdominal aortic aneurysm) without rupture ([place]) 06/14/2015   OBSTRUCTIVE SLEEP APNEA 06/09/2007   HYPERCHOLESTEROLEMIA 05/06/2007   GOUT 05/06/2007   DEPRESSION 05/06/2007   Essential hypertension, benign 05/06/2007   ABDOMINAL AORTIC ANEURYSM 05/06/2007   BENIGN PROSTATIC HYPERTROPHY, HX OF 05/06/2007    Past Surgical History:  Procedure Laterality Date   ABDOMINAL AORTIC ENDOVASCULAR FENESTRATED STENT GRAFT N/A 04/09/2016   Procedure: ABDOMINAL AORTIC ENDOVASCULAR FENESTRATED STENT GRAFT with left brachial artery access using ultrasound guidance;  Surgeon: Serafina Mitchell, MD;  Location: [place];  Service: Vascular;  Laterality:  N/A;   Abby Potash   [facility]   COLONOSCOPY  2006   2006: multiple diminutive polyps in rectum, one pedunculated polyp at 10 cm s/p snare removal. Left-sided diverticulum, pedunculated polyps at 35 cm and splenic flexure s/p snare removal. No path available.    COLONOSCOPY WITH PROPOFOL N/A 11/24/2019   Procedure: COLONOSCOPY WITH PROPOFOL;  Surgeon: Daneil Dolin, MD;  [facility];  Service: Endoscopy;  Laterality: N/A;  9:45am   PERIPHERAL VASCULAR CATHETERIZATION N/A 04/10/2016   Procedure: Renal Angiography;  Surgeon: Angelia Mould, MD;   Location: [place] [facility];  Service: Cardiovascular;  Laterality: N/A;   POLYPECTOMY  11/24/2019   Procedure: POLYPECTOMY;  Surgeon: Daneil Dolin, MD;  [facility];  Service: Endoscopy;;   TRANSURETHRAL RESECTION OF PROSTATE  01/07/2011   Procedure: TRANSURETHRAL RESECTION OF THE PROSTATE (TURP);  Surgeon: Marissa Nestle;  Location: [facility];  Service: Urology;  Laterality: N/A;       Home Medications    Prior to Admission medications   Medication Sig Start Date End Date Taking? Authorizing Provider  albuterol (VENTOLIN HFA) 108 (90 Base) MCG/ACT inhaler Use 4 puffs every 4-6 hours as needed for cough or wheeze 06/13/20   Althea Charon, FNP  allopurinol (ZYLOPRIM) 300 MG tablet Take 1 tablet (300 mg total) by mouth daily. 09/07/19   Corum, Rex Kras, MD  amLODipine (NORVASC) 10 MG tablet Take 1 tablet (10 mg total) by mouth daily. 09/06/19   Burtis Junes, NP  atorvastatin (LIPITOR) 20 MG tablet Take 1 tablet (20 mg total) by mouth daily. 08/24/19   Corum, Rex Kras, MD  Budeson-Glycopyrrol-Formoterol (BREZTRI AEROSPHERE) 160-9-4.8 MCG/ACT AERO Inhale 2 puffs into the lungs 2 (two) times daily. 12/06/20   Valentina Shaggy, MD  clopidogrel (PLAVIX) 75 MG tablet Take 1 tablet (75 mg total) by mouth daily. 09/07/19   Corum, Rex Kras, MD  hydrALAZINE (APRESOLINE) 50 MG tablet TAKE (1) TABLET TWICE DAILY. 01/23/20   Burtis Junes, NP  losartan (COZAAR) 100 MG tablet TAKE 1 TABLET BY MOUTH ONCE DAILY. 10/10/19   Corum, Rex Kras, MD  metFORMIN (GLUCOPHAGE) 500 MG tablet Take 1,000 mg by mouth 2 (two) times daily with a meal.  05/31/16   [provider]  metoprolol succinate (TOPROL-XL) 25 MG 24 hr tablet Take 1 tablet (25 mg total) by mouth daily. 09/06/19   Burtis Junes, NP  polyethylene glycol-electrolytes (TRILYTE) 420 g solution Take 4,000 mLs by mouth as directed. 08/25/19   Rourk, Cristopher Estimable, MD  sertraline (ZOLOFT) 50 MG tablet Take 1 tablet (50 mg total) by mouth daily.  08/29/19   Corum, Rex Kras, MD  torsemide (DEMADEX) 20 MG tablet Take 20 mg by mouth daily.    [provider]    Family History Family History  Problem Relation Age of Onset   Liver disease Father    Lymphoma Mother    Diabetes Mother    Hypertension Mother    Cancer Mother    Mental retardation Other        sibling -- from birth    Asthma Daughter    Allergic rhinitis Neg Hx    Angioedema Neg Hx    Atopy Neg Hx    Eczema Neg Hx    Immunodeficiency Neg Hx    Colon cancer Neg Hx     Social History Social History   Tobacco Use   Smoking status: Every Day  Packs/day: 3.00    Years: 50.00    Pack years: 150.00    Types: Cigarettes   Smokeless tobacco: Never   Tobacco comments:    2ppd 10/30/2016 ee  Vaping Use   Vaping Use: Never used  Substance Use Topics   Alcohol use: Yes    Alcohol/week: 0.0 standard drinks    Comment: very little   Drug use: No     Allergies   No known allergies   Review of Systems Review of Systems Pertinent negatives listed in HPI   Physical Exam Triage Vital Signs ED Triage Vitals [12/10/20 1435]  Enc Vitals Group     BP 119/69     Pulse Rate (!) 115     Resp (!) 26     Temp 97.9 F (36.6 C)     Temp Source Temporal     SpO2 (!) 81 %     Weight      Height      Head Circumference      Peak Flow      Pain Score 0     Pain Loc      Pain Edu?      Excl. in [place]?    No data found.  Updated Vital Signs BP 119/69 (BP Location: Right Arm)   Pulse (!) 115   Temp 97.9 F (36.6 C) (Temporal)   Resp (!) 26   SpO2 91%   Visual Acuity Right Eye Distance:   Left Eye Distance:   Bilateral Distance:    Right Eye Near:   Left Eye Near:    Bilateral Near:     Physical Exam Constitutional:      General: He is in acute distress.     Appearance: He is overweight.  HENT:     Head:     Comments: Hard of hearing Cardiovascular:     Rate and Rhythm: Regular rhythm. Tachycardia present.  Pulmonary:     Effort:  Respiratory distress present.     Breath sounds: Wheezing, rhonchi and rales present.  Abdominal:     General: Abdomen is protuberant. Bowel sounds are decreased.     Palpations: Abdomen is rigid.     UC Treatments / Results  Labs (all labs ordered are listed, but only abnormal results are displayed) Labs Reviewed - No data to display  EKG   Radiology No results found.  Procedures Procedures (including critical care time)  Medications Ordered in UC Medications - No data to display  Initial Impression / Assessment and Plan / UC Course  I have reviewed the triage vital signs and the nursing notes.  Pertinent labs & imaging results that were available during my care of the patient were reviewed by me and considered in my medical decision making (see chart for details).    EMS called has patient is hypoxic with oxygen level of 80% with diffuse wheezing, tachypnea and accessory muscle use.  Patient is also tachycardic however EMS arrived prior to has been able to obtain an EKG.  Patient was placed on 4 L of oxygen and oxygen level improved to 89 to 91%.  Recently used his home inhaler therefore did not readminister any albuterol via inhaler.  Patient is stable, alert and awake care transferred to EMS to transport to [facility] Final Clinical Impressions(s) / UC Diagnoses   Final diagnoses:  Acute respiratory failure with hypoxia (HCC)  SOB (shortness of breath)   Discharge Instructions   None    ED Prescriptions  None    PDMP not reviewed this encounter.   Scot Jun, FNP 12/10/20 [phone]

## 2020-12-10 NOTE — ED Provider Notes (Signed)
[facility] EMERGENCY DEPARTMENT Provider Note   CSN: [id number] Arrival date & time: 12/10/20  1508     History Chief Complaint  Patient presents with   Shortness of Breath    Kevin Frank is a 80 y.o. male has been extremity AAA, asthma, COPD, diabetes, hypertension who presents for evaluation of shortness of breath, cough that is been ongoing for last 2 to 3 days.  He went to urgent care today for evaluation of symptoms and was noted to be hypoxic with O2 sats in the 80s.  He was wheezing significantly.  He was placed on 4 L of O2 which improved his O2 sats to 91%.  EMS was called and continued O2 via nasal cannula.  He was given DuoNeb x2, Solu-Medrol with some improvement but still reports that he feels like he is wheezing.  He states cough has been ongoing for last 2 to 3 days.  He has had subjective fever and chills at home.  Denies any abdominal pain, nausea/vomiting.  He does smoke.  He reports he was using breathing treatments at home with minimal improvement.  The history is provided by the patient and the EMS personnel.      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    Normal nuclear stress test 2011   Obesity    Obstructive sleep apnea on CPAP    Presence of indwelling urinary catheter    Shortness of breath    due to cigarette abuse   Tobacco abuse     Patient Active Problem List   Diagnosis Date Noted   Acute respiratory failure ([place]) 12/10/2020   Wrist pain, acute, right 09/07/2019   Right forearm pain 09/07/2019   Uncontrolled type  2 diabetes mellitus with hyperglycemia ([place]) 06/27/2019   Mixed hyperlipidemia 06/27/2019   Class 1 obesity due to excess calories with serious comorbidity and body mass index (BMI) of 32.0 to 32.9 in adult 06/27/2019   Diabetes mellitus without complication (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   Obstructive sleep apnea on CPAP 04/23/2017   Chronic knee pain 04/23/2017   AKI (acute kidney injury) ([place]) 04/23/2017   Chronic diastolic CHF (congestive heart failure) ([place]) 04/23/2017   Cellulitis 04/23/2017   Cellulitis of left lower leg    Current smoker 03/05/2016   Incidental lung nodule, > 63mm and < 22mm 03/05/2016   Stage 2 moderate COPD by GOLD classification ([place]) 03/05/2016   AAA (abdominal aortic aneurysm) without rupture ([place]) 06/14/2015   OBSTRUCTIVE SLEEP APNEA 06/09/2007   HYPERCHOLESTEROLEMIA 05/06/2007   GOUT 05/06/2007   DEPRESSION 05/06/2007   Essential hypertension, benign 05/06/2007   ABDOMINAL AORTIC ANEURYSM 05/06/2007   BENIGN PROSTATIC HYPERTROPHY, HX OF 05/06/2007    Past Surgical History:  Procedure  Laterality Date   ABDOMINAL AORTIC ENDOVASCULAR FENESTRATED STENT GRAFT N/A 04/09/2016   Procedure: ABDOMINAL AORTIC ENDOVASCULAR FENESTRATED STENT GRAFT with left brachial artery access using ultrasound guidance;  Surgeon: Serafina Mitchell, MD;  Location: [facility] OR;  Service: Vascular;  Laterality: N/A;   [place]   [facility]   COLONOSCOPY  2006   2006: multiple diminutive polyps in rectum, one pedunculated polyp at 10 cm s/p snare removal. Left-sided diverticulum, pedunculated polyps at 35 cm and splenic flexure s/p snare removal. No path available.    COLONOSCOPY WITH PROPOFOL N/A 11/24/2019   Procedure: COLONOSCOPY WITH PROPOFOL;  Surgeon: Daneil Dolin, MD;  Location[facility] END[facility];  Service: Endoscopy;  Laterality: N/A;  9:45am   PERIPHERAL VASCULAR CATHETERIZATION N/A 04/10/2016   Procedure: Renal Angiography;  Surgeon: Angelia Mould, MD;  Location: [facility] CV LAB;  Service: Cardiovascular;  Laterality: N/A;   POLYPECTOMY  11/24/2019   Procedure: POLYPECTOMY;  Surgeon: Daneil Dolin, MD;  Location: [facility] END[facility];  Service: Endoscopy;;   TRANSURETHRAL RESECTION OF PROSTATE  01/07/2011   Procedure: TRANSURETHRAL RESECTION OF THE PROSTATE (TURP);  Surgeon: Marissa Nestle;  Location: [facility] ORS;  Service: Urology;  Laterality: N/A;       Family History  Problem Relation Age of Onset   Liver disease Father    Lymphoma Mother    Diabetes Mother    Hypertension Mother    Cancer Mother    Mental retardation Other        sibling -- from birth    Asthma Daughter    Allergic rhinitis Neg Hx    Angioedema Neg Hx    Atopy Neg Hx    Eczema Neg Hx    Immunodeficiency Neg Hx    Colon cancer Neg Hx     Social History   Tobacco Use   Smoking status: Every Day    Packs/day: 3.00    Years: 50.00    Pack years: 150.00    Types: Cigarettes   Smokeless tobacco: Never   Tobacco comments:    2ppd 10/30/2016 ee  Vaping Use   Vaping Use: Never used  Substance Use Topics   Alcohol use: Yes    Alcohol/week: 0.0 standard drinks    Comment: very little   Drug use: No    Home Medications Prior to Admission medications   Medication Sig Start Date End Date Taking? Authorizing Provider  albuterol (VENTOLIN HFA) 108 (90 Base) MCG/ACT inhaler Use 4 puffs every 4-6 hours as needed for cough or wheeze 06/13/20  Yes Althea Charon, FNP  allopurinol (ZYLOPRIM) 300 MG tablet Take 1 tablet (300 mg total) by mouth daily. 09/07/19  Yes Corum, Rex Kras, MD  amLODipine (NORVASC) 10 MG tablet Take 1 tablet (10 mg total) by mouth daily. 09/06/19  Yes Burtis Junes, NP  atorvastatin (LIPITOR) 20 MG tablet Take 1 tablet (20 mg total) by mouth  daily. 08/24/19  Yes Corum, Rex Kras, MD  Budeson-Glycopyrrol-Formoterol (BREZTRI AEROSPHERE) 160-9-4.8 MCG/ACT AERO Inhale 2 puffs into the lungs 2 (two) times daily. 12/06/20  Yes Valentina Shaggy, MD  clopidogrel (PLAVIX) 75 MG tablet Take 1 tablet (75 mg total) by mouth daily. 09/07/19  Yes Corum, Rex Kras, MD  hydrALAZINE (APRESOLINE) 50 MG tablet TAKE (1) TABLET TWICE DAILY. Patient taking differently: Take 50 mg by mouth in the morning and at bedtime. 01/23/20  Yes Burtis Junes, NP  losartan (COZAAR) 100 MG tablet TAKE 1 TABLET BY MOUTH  ONCE DAILY. Patient taking differently: Take 100 mg by mouth daily. 10/10/19  Yes Corum, Rex Kras, MD  metFORMIN (GLUCOPHAGE) 500 MG tablet Take 1,000 mg by mouth 2 (two) times daily with a meal.  05/31/16  Yes [provider]  metoprolol succinate (TOPROL-XL) 25 MG 24 hr tablet Take 1 tablet (25 mg total) by mouth daily. 09/06/19  Yes Burtis Junes, NP  sertraline (ZOLOFT) 50 MG tablet Take 1 tablet (50 mg total) by mouth daily. 08/29/19  Yes Corum, Rex Kras, MD  torsemide (DEMADEX) 20 MG tablet Take 20 mg by mouth daily.   Yes [provider]  polyethylene glycol-electrolytes (TRILYTE) 420 g solution Take 4,000 mLs by mouth as directed. 08/25/19   Rourk, Cristopher Estimable, MD    Allergies    No known allergies  Review of Systems   Review of Systems  Constitutional:  Positive for chills and fever.  Respiratory:  Positive for cough, shortness of breath and wheezing.   Cardiovascular:  Negative for chest pain.  Gastrointestinal:  Negative for abdominal pain, nausea and vomiting.  Genitourinary:  Negative for dysuria and hematuria.  Neurological:  Negative for headaches.  All other systems reviewed and are negative.  Physical Exam Updated Vital Signs BP (!) 131/58   Pulse (!) 102   Temp (!) 100.8 F (38.2 C) (Rectal)   Resp (!) 22   Ht 5\' 10"  (1.778 m)   Wt 102.5 kg   SpO2 94%   BMI 32.43 kg/m   Physical Exam Vitals and nursing note  reviewed.  Constitutional:      Appearance: Normal appearance. He is well-developed.  HENT:     Head: Normocephalic and atraumatic.  Eyes:     General: Lids are normal.     Conjunctiva/sclera: Conjunctivae normal.     Pupils: Pupils are equal, round, and reactive to light.  Cardiovascular:     Rate and Rhythm: Normal rate and regular rhythm.     Pulses: Normal pulses.     Heart sounds: Normal heart sounds. No murmur heard.   No friction rub. No gallop.  Pulmonary:     Effort: Tachypnea present.     Breath sounds: Wheezing present.     Comments: Diffuse wheezing, rhonchi noted. Abdominal:     Palpations: Abdomen is soft. Abdomen is not rigid.     Tenderness: There is no abdominal tenderness. There is no guarding.     Comments: Abdomen is soft, non-distended, non-tender. No rigidity, No guarding. No peritoneal signs.   Musculoskeletal:        General: Normal range of motion.     Cervical back: Full passive range of motion without pain.  Skin:    General: Skin is warm and dry.     Capillary Refill: Capillary refill takes less than 2 seconds.  Neurological:     Mental Status: He is alert and oriented to person, place, and time.  Psychiatric:        Speech: Speech normal.    ED Results / Procedures / Treatments   Labs (all labs ordered are listed, but only abnormal results are displayed) Labs Reviewed  COMPREHENSIVE METABOLIC PANEL - Abnormal; Notable for the following components:      Result Value   Sodium 131 (*)    Potassium 3.4 (*)    Chloride 97 (*)    Glucose, Bld 219 (*)    Albumin 3.3 (*)    All other components within normal limits  CBC WITH DIFFERENTIAL/PLATELET - Abnormal; Notable  for the following components:   WBC 18.0 (*)    Platelets 147 (*)    Neutro Abs 15.1 (*)    Monocytes Absolute 1.5 (*)    Abs Immature Granulocytes 0.15 (*)    All other components within normal limits  RESP PANEL BY RT-PCR (FLU A&B, COVID) ARPGX2  CULTURE, BLOOD (ROUTINE X 2)   CULTURE, BLOOD (ROUTINE X 2)  URINE CULTURE  LACTIC ACID, PLASMA  LACTIC ACID, PLASMA  URINALYSIS, ROUTINE W REFLEX MICROSCOPIC    EKG EKG Interpretation  Date/Time:  Monday December 10 2020 15:14:50 EDT Ventricular Rate:  108 PR Interval:  153 QRS Duration: 108 QT Interval:  331 QTC Calculation: 444 R Axis:   -81 Text Interpretation: Sinus tachycardia Left axis deviation Abnormal R-wave progression, early transition tachycardia new, otherwise unchanged compared to 2018 Confirmed by Sherwood Gambler [phone]) on 12/10/2020 3:30:18 PM  Radiology DG Chest Portable 1 View  Result Date: 12/10/2020 CLINICAL DATA:  Fever and shortness of breath for 4 days. History of COPD and lung carcinoma. EXAM: PORTABLE CHEST 1 VIEW COMPARISON:  01/18/2019 and older exams.  Chest CT, 10/25/2018. FINDINGS: There are bilateral interstitial opacities, most prominent in the left upper lobe, increased compared to the chest radiograph from 01/18/2019, but similar to the more recent prior chest CT. No focal lung consolidation. Lungs are hyperexpanded. No pleural effusion or pneumothorax. Heart is normal in size.  No mediastinal or hilar masses. Skeletal structures are grossly intact. IMPRESSION: 1. Bilateral interstitial lung opacities that are most evident in the left upper lobe. Findings appears similar to the topogram from the chest CT dated 10/24/2020 and may all be chronic. Acute interstitial infection, particularly in the left upper lobe, however, should be considered given the patient's symptoms. 2. Underlying COPD/emphysema. Electronically Signed   By: Lajean Manes M.D.   On: 12/10/2020 15:58    Procedures .Critical Care  Date/Time: 12/10/2020 5:22 PM Performed by: Volanda Napoleon, PA-C Authorized by: Volanda Napoleon, PA-C   Critical care provider statement:    Critical care time (minutes):  35   Critical care time was exclusive of:  Separately billable procedures and treating other patients   Critical  care was necessary to treat or prevent imminent or life-threatening deterioration of the following conditions:  Respiratory failure   Critical care was time spent personally by me on the following activities:  Discussions with consultants, evaluation of patient's response to treatment, examination of patient, ordering and performing treatments and interventions, ordering and review of laboratory studies, ordering and review of radiographic studies, pulse oximetry, re-evaluation of patient's condition, obtaining history from patient or surrogate and review of old charts   I assumed direction of critical care for this patient from another provider in my specialty: yes     Care discussed with: admitting provider     Medications Ordered in ED Medications  lactated ringers infusion (has no administration in time range)  cefTRIAXone (ROCEPHIN) 2 g in sodium chloride 0.9 % 100 mL IVPB (2 g Intravenous New Bag/Given 12/10/20 1638)  azithromycin (ZITHROMAX) 500 mg in sodium chloride 0.9 % 250 mL IVPB (500 mg Intravenous New Bag/Given 12/10/20 1723)  magnesium sulfate IVPB 2 g 50 mL (0 g Intravenous Stopped 12/10/20 1650)  acetaminophen (TYLENOL) tablet 1,000 mg (1,000 mg Oral Given 12/10/20 1559)  lactated ringers bolus 1,000 mL (1,000 mLs Intravenous New Bag/Given 12/10/20 1657)    ED Course  I have reviewed the triage vital signs and the nursing notes.  Pertinent  labs & imaging results that were available during my care of the patient were reviewed by me and considered in my medical decision making (see chart for details).    MDM Rules/Calculators/A&P                          80 year old male who presents for evaluation of shortness of breath.  He reports at home for the last 2 days, he has had shortness of breath, cough, fevers.  Went to urgent care today and was noted to be hypoxic in the 80s.  EMS was called and patient transferred to the ED.  On EMS arrival they given Solu-Medrol, DuoNeb with some  improvement.  On initial ED arrival, he is febrile 100.8, tachypneic, tachycardic.  He is likely tachycardic from albuterol that he was given.  On my exam, he still has some audible wheezing as well as diffuse wheezing, rhonchi noted.  We will start him on magnesium and reassess.  CXR shows bilateral lung opacities that are most evident in the left upper lobe.  There are some similar appearance to previous chest CT but also states that could be indicative of acute infection particular in the left upper lobe.  CBC shows leukocytosis of 18.0.  CMP shows sodium of 131. Potassium of 3.4. glucose of 219.  Lactic is 1.1.  Given concerns of infectious etiology, code sepsis initiated.  At this time, blood pressure stable, lactic is less than 4.  We will hold off on full 30 cc/kg of fluid so as not to fluid overload  At this time, concerned about sepsis and hypoxia from pneumonia.  Patient started on antibiotics.  Blood cultures have been sent.  Given that he is hypoxic and requiring oxygen, will plan for admission.  Discussed patient with Dr. Roque Cash (hospitalist) who accepts patient for admission.   Portions of this note were generated with [employer]. Dictation errors may occur despite best attempts at proofreading.   Final Clinical Impression(s) / ED Diagnoses Final diagnoses:  Community acquired pneumonia of left upper lobe of lung  Sepsis without acute organ dysfunction, due to unspecified organism [facility])    Rx / DC Orders ED Discharge Orders     None        Desma Mcgregor 12/10/20 1811    Sherwood Gambler, MD 12/13/20 [phone]

## 2020-12-11 ENCOUNTER — Telehealth: Payer: Self-pay | Admitting: Allergy & Immunology

## 2020-12-11 ENCOUNTER — Inpatient Hospital Stay (HOSPITAL_COMMUNITY): Payer: Medicare HMO

## 2020-12-11 DIAGNOSIS — C3491 Malignant neoplasm of unspecified part of right bronchus or lung: Secondary | ICD-10-CM

## 2020-12-11 DIAGNOSIS — J181 Lobar pneumonia, unspecified organism: Principal | ICD-10-CM

## 2020-12-11 LAB — CBC
HCT: 43 % (ref 39.0–52.0)
Hemoglobin: 14.3 g/dL (ref 13.0–17.0)
MCH: 31.4 pg (ref 26.0–34.0)
MCHC: 33.3 g/dL (ref 30.0–36.0)
MCV: 94.5 fL (ref 80.0–100.0)
Platelets: 142 10*3/uL — ABNORMAL LOW (ref 150–400)
RBC: 4.55 MIL/uL (ref 4.22–5.81)
RDW: 13.9 % (ref 11.5–15.5)
WBC: 15 10*3/uL — ABNORMAL HIGH (ref 4.0–10.5)
nRBC: 0 % (ref 0.0–0.2)

## 2020-12-11 LAB — PROCALCITONIN: Procalcitonin: 1.11 ng/mL

## 2020-12-11 LAB — GLUCOSE, CAPILLARY
Glucose-Capillary: 329 mg/dL — ABNORMAL HIGH (ref 70–99)
Glucose-Capillary: 306 mg/dL — ABNORMAL HIGH (ref 70–99)
Glucose-Capillary: 281 mg/dL — ABNORMAL HIGH (ref 70–99)
Glucose-Capillary: 277 mg/dL — ABNORMAL HIGH (ref 70–99)

## 2020-12-11 LAB — COMPREHENSIVE METABOLIC PANEL
ALT: 23 U/L (ref 0–44)
AST: 19 U/L (ref 15–41)
Albumin: 3.1 g/dL — ABNORMAL LOW (ref 3.5–5.0)
Alkaline Phosphatase: 96 U/L (ref 38–126)
Anion gap: 9 (ref 5–15)
BUN: 18 mg/dL (ref 8–23)
CO2: 24 mmol/L (ref 22–32)
Calcium: 8.9 mg/dL (ref 8.9–10.3)
Chloride: 97 mmol/L — ABNORMAL LOW (ref 98–111)
Creatinine, Ser: 0.9 mg/dL (ref 0.61–1.24)
GFR, Estimated: >60 mL/min (ref >60)
Glucose, Bld: 367 mg/dL — ABNORMAL HIGH (ref 70–99)
Potassium: 3.7 mmol/L (ref 3.5–5.1)
Sodium: 130 mmol/L — ABNORMAL LOW (ref 135–145)
Total Bilirubin: 0.6 mg/dL (ref 0.3–1.2)
Total Protein: 6.7 g/dL (ref 6.5–8.1)

## 2020-12-11 LAB — HIV ANTIBODY (ROUTINE TESTING W REFLEX): HIV Screen 4th Generation wRfx: NONREACTIVE

## 2020-12-11 MED ORDER — INSULIN ASPART 100 UNIT/ML IJ SOLN: 0.0000 [IU] | Freq: Three times a day (TID) | INTRAMUSCULAR | Status: DC

## 2020-12-11 MED ORDER — INSULIN GLARGINE 100 UNIT/ML ~~LOC~~ SOLN
10.0000 [IU] | Freq: Every day | SUBCUTANEOUS | Status: DC
  Administered 2020-12-11: 10 [IU] via SUBCUTANEOUS
  Filled 2020-12-11 (×2): qty 0.1

## 2020-12-11 MED ORDER — GUAIFENESIN-DM 100-10 MG/5ML PO SYRP
5.0000 mL | ORAL_SOLUTION | ORAL | Status: DC | PRN
  Administered 2020-12-11: 5 mL via ORAL
  Filled 2020-12-11: qty 5

## 2020-12-11 MED ORDER — POTASSIUM CHLORIDE IN NACL 20-0.9 MEQ/L-% IV SOLN: INTRAVENOUS | Status: DC

## 2020-12-11 MED ORDER — INSULIN ASPART 100 UNIT/ML IJ SOLN
0.0000 [IU] | Freq: Every day | INTRAMUSCULAR | Status: DC
  Administered 2020-12-11: 3 [IU] via SUBCUTANEOUS

## 2020-12-11 MED ORDER — IPRATROPIUM-ALBUTEROL 0.5-2.5 (3) MG/3ML IN SOLN
3.0000 mL | Freq: Four times a day (QID) | RESPIRATORY_TRACT | Status: DC
  Administered 2020-12-11 – 2020-12-12 (×6): 3 mL via RESPIRATORY_TRACT
  Filled 2020-12-11 (×6): qty 3

## 2020-12-11 MED ORDER — INSULIN ASPART 100 UNIT/ML IJ SOLN
0.0000 [IU] | Freq: Every day | INTRAMUSCULAR | Status: DC
  Administered 2020-12-11: 5 [IU] via SUBCUTANEOUS

## 2020-12-11 MED ORDER — BUDESONIDE 0.5 MG/2ML IN SUSP
0.5000 mg | Freq: Two times a day (BID) | RESPIRATORY_TRACT | Status: DC
  Administered 2020-12-11 – 2020-12-12 (×3): 0.5 mg via RESPIRATORY_TRACT
  Filled 2020-12-11 (×3): qty 2

## 2020-12-11 MED ORDER — BENZONATATE 100 MG PO CAPS
200.0000 mg | ORAL_CAPSULE | Freq: Three times a day (TID) | ORAL | Status: DC
  Administered 2020-12-11 – 2020-12-12 (×2): 200 mg via ORAL
  Filled 2020-12-11 (×2): qty 2

## 2020-12-11 MED ORDER — INSULIN ASPART 100 UNIT/ML IJ SOLN
0.0000 [IU] | Freq: Three times a day (TID) | INTRAMUSCULAR | Status: DC
  Administered 2020-12-11: 11 [IU] via SUBCUTANEOUS
  Administered 2020-12-11 (×2): 15 [IU] via SUBCUTANEOUS
  Administered 2020-12-12: 20 [IU] via SUBCUTANEOUS
  Administered 2020-12-12: 15 [IU] via SUBCUTANEOUS

## 2020-12-11 MED ORDER — METHYLPREDNISOLONE SODIUM SUCC 125 MG IJ SOLR
80.0000 mg | Freq: Two times a day (BID) | INTRAMUSCULAR | Status: DC
  Administered 2020-12-11 – 2020-12-12 (×2): 80 mg via INTRAVENOUS
  Filled 2020-12-11 (×2): qty 2

## 2020-12-11 NOTE — Progress Notes (Signed)
PROGRESS NOTE  Kevin Frank RCV:[id number] DOB: [birth year] DOA: 12/10/2020 PCP: Sharilyn Sites, MD  Brief History:  80 year old male with a history of COPD, diabetes mellitus type 2, hypertension, hyperlipidemia, squamous cell carcinoma of the right lung, diastolic CHF, OSA on CPAP, tobacco abuse, AAA presenting with 3-day history of shortness of breath and coughing.  The patient states that he had fevers up to 101.1 F at home.  He denied any hemoptysis.  He states that his coughing is largely nonproductive.  He denies any headache, nausea, vomiting, diarrhea, abdominal pain, dysuria, hematuria.  He has some chest discomfort with coughing.  Unfortunately, he continues to smoke 2 packs/day.  The patient went to urgent care on 12/10/2020.  He was noted to be hypoxic with oxygen saturation 80% and tachycardic.  The patient was subsequently transferred to the emergency department.  He was given Solu-Medrol and duo nebs by EMS.  The patient continued to have shortness of breath and wheezing.  As result, the patient was admitted for further evaluation and treatment. In the emergency department, the patient was febrile up to 100.8 F with tachycardia 110-120.  He was hemodynamically stable with oxygen saturation 92-95% on 3 L.  BMP showed sodium 130, potassium 3.7, serum creatinine 0.90.  LFTs were unremarkable.  WBC 18.0, hemoglobin 14.9, platelets 147,000.  Lactic acid was 1.3.  EKG shows sinus rhythm with nonspecific T wave changes.  COVID RT-PCR was negative.  Chest x-ray showed bilateral interstitial opacities worst in the left upper lobe.  The patient was started on ceftriaxone and azithromycin.  Bronchodilators and IV Solu-Medrol was started.  Assessment/Plan: Acute respiratory failure with hypoxia -Secondary to pneumonia and COPD exacerbation -Currently stable on 3 L nasal cannula -Wean oxygen as tolerated for saturation greater 90% -Personally reviewed chest x-ray-increased interstitial  markings worst LUL  COPD exacerbation -Start Pulmicort -Start duo nebs -Continue IV Solu-Medrol--increased dose to 80 mg IV every 12 hours  Lobar pneumonia -Check PCT -CT chest -Continue ceftriaxone azithromycin  Diabetes mellitus type 2 -NovoLog sliding scale -Start Lantus 10 units -Anticipate elevated CBGs secondary to steroids -Hemoglobin A1c -Holding metformin  Chronic diastolic CHF -Holding torsemide temporarily -Judicious IV fluids -Clinically euvolemic on exam -11/15/2019 echo EF 60 to 65%, grade 1 DD, no WMA; PASP 41.7  Essential hypertension -Continue amlodipine -Continue metoprolol succinate -Continue hydralazine -Holding losartan temporarily  Depression/anxiety -Continue sertraline  Hyperlipidemia -Continue statin  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 Delton Coombes  Tobacco abuse -Tobacco cessation discussed  Hyponatremia -Secondary to poor solute intake and volume depletion -Holding torsemide temporarily -Judicious IV fluids x24 hours      Status is: Inpatient  Remains inpatient appropriate because:IV treatments appropriate due to intensity of illness or inability to take PO  Dispo: The patient is from: Home              Anticipated d/c is to: Home              Patient currently is not medically stable to d/c.   Difficult to place patient No        Family Communication:   Family at bedside  Consultants:    Code Status:  FULL / DNR  DVT Prophylaxis:  [place] Heparin / [place] Lovenox   Procedures: As Listed in Progress Note Above  Antibiotics: Ceftriaxone/azithro 6/20>>        Subjective: He complains of cough and sob, a  little better.  Denies f/c, cp, n/v/d.  Objective: Vitals:   12/11/20 0025 12/11/20 0216 12/11/20 0554 12/11/20 0745  BP: 123/69 (!) 152/72 (!) 122/54   Pulse: 74 100 72   Resp: 20 (!) 22 20   Temp:  97.6 F (36.4 C) (!) 97.5 F (36.4 C)   TempSrc:   Oral    SpO2: 94% 95% 92% 93%  Weight:      Height:        Intake/Output Summary (Last 24 hours) at 12/11/2020 0850 Last data filed at 12/11/2020 0500 Gross per 24 hour  Intake 2357.95 ml  Output 900 ml  Net 1457.95 ml   Weight change:  Exam:  General:  Pt is alert, follows commands appropriately, not in acute distress HEENT: No icterus, No thrush, No neck mass, [place]/AT Cardiovascular: RRR, S1/S2, no rubs, no gallops Respiratory: bilateral rales.  Bilateral wheeze Abdomen: Soft/+BS, non tender, non distended, no guarding Extremities: No edema, No lymphangitis, No petechiae, No rashes, no synovitis   Data Reviewed: I have personally reviewed following labs and imaging studies Basic Metabolic Panel: Recent Labs  Lab 12/10/20 1558 12/10/20 2002 12/11/20 0522  NA 131*  --  130*  K 3.4*  --  3.7  CL 97*  --  97*  CO2 24  --  24  GLUCOSE 219*  --  367*  BUN 12  --  18  CREATININE 0.86 0.96 0.90  CALCIUM 8.9  --  8.9   Liver Function Tests: Recent Labs  Lab 12/10/20 1558 12/11/20 0522  AST 19 19  ALT 19 23  ALKPHOS 87 96  BILITOT 0.7 0.6  PROT 7.0 6.7  ALBUMIN 3.3* 3.1*   No results for input(s): LIPASE, AMYLASE in the last 168 hours. No results for input(s): AMMONIA in the last 168 hours. Coagulation Profile: No results for input(s): INR, PROTIME in the last 168 hours. CBC: Recent Labs  Lab 12/10/20 1558 12/10/20 2002 12/11/20 0522  WBC 18.0* 16.9* 15.0*  NEUTROABS 15.1*  --   --   HGB 14.9 14.3 14.3  HCT 43.6 42.1 43.0  MCV 93.6 94.6 94.5  PLT 147* 134* 142*   Cardiac Enzymes: No results for input(s): CKTOTAL, CKMB, CKMBINDEX, TROPONINI in the last 168 hours. BNP: Invalid input(s): POCBNP CBG: Recent Labs  Lab 12/10/20 2217 12/11/20 0807  GLUCAP 423* 329*   HbA1C: No results for input(s): HGBA1C in the last 72 hours. Urine analysis:    Component Value Date/Time   COLORURINE YELLOW 04/02/2016 [place] 04/02/2016 1037   LABSPEC  1.006 04/02/2016 1037   PHURINE 6.0 04/02/2016 1037   GLUCOSEU NEGATIVE 04/02/2016 1037   HGBUR NEGATIVE 04/02/2016 1037   BILIRUBINUR NEGATIVE 04/02/2016 1037   KETONESUR NEGATIVE 04/02/2016 1037   PROTEINUR NEGATIVE 04/02/2016 1037   NITRITE NEGATIVE 04/02/2016 1037   LEUKOCYTESUR SMALL (A) 04/02/2016 1037   Sepsis Labs: @LABRCNTIP (procalcitonin:4,lacticidven:4) ) Recent Results (from the past 240 hour(s))  Resp Panel by RT-PCR (Flu A&B, Covid) Nasopharyngeal Swab     Status: None   Collection Time: 12/10/20  3:16 PM   Specimen: Nasopharyngeal Swab; Nasopharyngeal(NP) swabs in vial transport medium  Result Value Ref Range Status   SARS Coronavirus 2 by RT PCR NEGATIVE NEGATIVE Final    Comment: (NOTE) SARS-CoV-2 target nucleic acids are NOT DETECTED.  The SARS-CoV-2 RNA is generally detectable in upper respiratory specimens during the acute phase of infection. The lowest concentration of SARS-CoV-2 viral copies this assay can detect is 138  copies/mL. A negative result does not preclude SARS-Cov-2 infection and should not be used as the sole basis for treatment or other patient management decisions. A negative result may occur with  improper specimen collection/handling, submission of specimen other than nasopharyngeal swab, presence of viral mutation(s) within the areas targeted by this assay, and inadequate number of viral copies(<138 copies/mL). A negative result must be combined with clinical observations, patient history, and epidemiological information. The expected result is Negative.  Fact Sheet for Patients:  EntrepreneurPulse.com.au  Fact Sheet for Healthcare Providers:  IncredibleEmployment.be  This test is no t yet approved or cleared by the [place] FDA and  has been authorized for detection and/or diagnosis of SARS-CoV-2 by FDA under an Emergency Use Authorization (EUA). This EUA will remain  in effect (meaning this  test can be used) for the duration of the COVID-19 declaration under Section 564(b)(1) of the Act, 21 U.S.C.section 360bbb-3(b)(1), unless the authorization is terminated  or revoked sooner.       Influenza A by PCR NEGATIVE NEGATIVE Final   Influenza B by PCR NEGATIVE NEGATIVE Final    Comment: (NOTE) The Xpert Xpress SARS-CoV-2/FLU/RSV plus assay is intended as an aid in the diagnosis of influenza from Nasopharyngeal swab specimens and should not be used as a sole basis for treatment. Nasal washings and aspirates are unacceptable for Xpert Xpress SARS-CoV-2/FLU/RSV testing.  Fact Sheet for Patients: EntrepreneurPulse.com.au  Fact Sheet for Healthcare Providers: IncredibleEmployment.be  This test is not yet approved or cleared by the [place] FDA and has been authorized for detection and/or diagnosis of SARS-CoV-2 by FDA under an Emergency Use Authorization (EUA). This EUA will remain in effect (meaning this test can be used) for the duration of the COVID-19 declaration under Section 564(b)(1) of the Act, 21 U.S.C. section 360bbb-3(b)(1), unless the authorization is terminated or revoked.  Performed at [facility], [address] [place], [place] [address]   Blood culture (routine x 2)     Status: None (Preliminary result)   Collection Time: 12/10/20  3:58 PM   Specimen: BLOOD  Result Value Ref Range Status   Specimen Description BLOOD BLOOD LEFT HAND  Final   Special Requests   Final    Blood Culture adequate volume BOTTLES DRAWN AEROBIC AND ANAEROBIC   Culture   Final    NO GROWTH < 24 HOURS Performed at [facility], [address] [place], [place] [address]    Report Status PENDING  Incomplete  Blood culture (routine x 2)     Status: None (Preliminary result)   Collection Time: 12/10/20  3:58 PM   Specimen: BLOOD  Result Value Ref Range Status   Specimen Description BLOOD BLOOD RIGHT ARM  Final   Special Requests   Final     Blood Culture adequate volume BOTTLES DRAWN AEROBIC AND ANAEROBIC   Culture   Final    NO GROWTH < 24 HOURS Performed at [facility], [address] [place], [place] [address]    Report Status PENDING  Incomplete     Scheduled Meds:  allopurinol  300 mg Oral Daily   amLODipine  10 mg Oral Daily   atorvastatin  20 mg Oral Daily   budesonide (PULMICORT) nebulizer solution  0.5 mg Nebulization BID   clopidogrel  75 mg Oral Daily   heparin  5,000 Units Subcutaneous Q8H   hydrALAZINE  50 mg Oral BID   insulin aspart  0-20 Units Subcutaneous TID WC   insulin aspart  0-5 Units  Subcutaneous QHS   insulin glargine  10 Units Subcutaneous Daily   ipratropium-albuterol  3 mL Nebulization Q6H   methylPREDNISolone (SOLU-MEDROL) injection  80 mg Intravenous Q12H   metoprolol succinate  25 mg Oral Daily   nicotine  21 mg Transdermal Daily   sertraline  50 mg Oral Daily   torsemide  20 mg Oral Daily   Continuous Infusions:  0.9 % NaCl with KCl 20 mEq / L 100 mL/hr at 12/11/20 0835   azithromycin Stopped (12/10/20 1901)   cefTRIAXone (ROCEPHIN)  IV Stopped (12/10/20 1901)   lactated ringers      Procedures/Studies: DG Chest Portable 1 View  Result Date: 12/10/2020 CLINICAL DATA:  Fever and shortness of breath for 4 days. History of COPD and lung carcinoma. EXAM: PORTABLE CHEST 1 VIEW COMPARISON:  01/18/2019 and older exams.  Chest CT, 10/25/2018. FINDINGS: There are bilateral interstitial opacities, most prominent in the left upper lobe, increased compared to the chest radiograph from 01/18/2019, but similar to the more recent prior chest CT. No focal lung consolidation. Lungs are hyperexpanded. No pleural effusion or pneumothorax. Heart is normal in size.  No mediastinal or hilar masses. Skeletal structures are grossly intact. IMPRESSION: 1. Bilateral interstitial lung opacities that are most evident in the left upper lobe. Findings appears similar to the topogram from the chest CT dated  10/24/2020 and may all be chronic. Acute interstitial infection, particularly in the left upper lobe, however, should be considered given the patient's symptoms. 2. Underlying COPD/emphysema. Electronically Signed   By: Lajean Manes M.D.   On: 12/10/2020 15:58    Orson Eva, DO  [facility]  If 7PM-7AM, please contact night-coverage [email or web address] Password [facility] 12/11/2020, 8:50 AM   LOS: 1 day

## 2020-12-11 NOTE — Telephone Encounter (Signed)
Patient's wife called to let Dr. Ernst Bowler know that patient was hospitalized yesterday at [facility] with pneumonia in the upper left lobe.   Patient's wife can be reached at [phone] If patient's wife cannot be reached, patient's daughter can be reached at [phone]

## 2020-12-11 NOTE — Progress Notes (Signed)
Inpatient Diabetes Program Recommendations  AACE/ADA: New Consensus Statement on Inpatient Glycemic Control   Target Ranges:  Prepandial:   less than 140 mg/dL      Peak postprandial:   less than 180 mg/dL (1-2 hours)      Critically ill patients:  140 - 180 mg/dL   Results for Kevin Frank, Kevin Frank (MRN [id number]) as of 12/11/2020 07:44  Ref. Range 12/10/2020 15:58 12/11/2020 05:22  Glucose Latest Ref Range: 70 - 99 mg/dL 219 (H) 367 (H)   Results for Kevin Frank, Kevin Frank (MRN [id number]) as of 12/11/2020 07:44  Ref. Range 12/10/2020 22:17  Glucose-Capillary Latest Ref Range: 70 - 99 mg/dL 423 (H)   Review of Glycemic Control  Diabetes history: DM2 Outpatient Diabetes medications: Metformin 1000 mg BID Current orders for Inpatient glycemic control: Novolog 0-20 units TID with meals, Novolog 0-5 units QHS; Solumedrol 80 mg Q12H  Inpatient Diabetes Program Recommendations:    Insulin: If steroids are continued, please consider ordering Lantus 10 units Q24H.  NOTE: In reviewing chart, noted patient received Solumedrol in route to hospital by EMS and has received it twice since arrival to hospital. Also noted patient recently received steroid injection on 12/06/20 by Dr. Aline Brochure for left shoulder pain.   Thanks, Barnie Alderman, RN, MSN, CDE Diabetes Coordinator Inpatient Diabetes Program [phone] (Team Pager from 8am to 5pm)

## 2020-12-11 NOTE — Telephone Encounter (Signed)
Will call tomorrow to check on him.

## 2020-12-11 NOTE — Telephone Encounter (Signed)
Great - let's call the family tomorrow to check on him.  Salvatore Marvel, MD [facility] [place]

## 2020-12-12 LAB — CBC
HCT: 44.1 % (ref 39.0–52.0)
Hemoglobin: 15 g/dL (ref 13.0–17.0)
MCH: 32.3 pg (ref 26.0–34.0)
MCHC: 34 g/dL (ref 30.0–36.0)
MCV: 95 fL (ref 80.0–100.0)
Platelets: 176 10*3/uL (ref 150–400)
RBC: 4.64 MIL/uL (ref 4.22–5.81)
RDW: 14.2 % (ref 11.5–15.5)
WBC: 19.8 10*3/uL — ABNORMAL HIGH (ref 4.0–10.5)
nRBC: 0 % (ref 0.0–0.2)

## 2020-12-12 LAB — HEMOGLOBIN A1C
Hgb A1c MFr Bld: 8.1 % — ABNORMAL HIGH (ref 4.8–5.6)
Mean Plasma Glucose: 186 mg/dL

## 2020-12-12 LAB — BASIC METABOLIC PANEL
Anion gap: 9 (ref 5–15)
BUN: 25 mg/dL — ABNORMAL HIGH (ref 8–23)
CO2: 23 mmol/L (ref 22–32)
Calcium: 9.1 mg/dL (ref 8.9–10.3)
Chloride: 103 mmol/L (ref 98–111)
Creatinine, Ser: 0.83 mg/dL (ref 0.61–1.24)
GFR, Estimated: >60 mL/min (ref >60)
Glucose, Bld: 300 mg/dL — ABNORMAL HIGH (ref 70–99)
Potassium: 4 mmol/L (ref 3.5–5.1)
Sodium: 135 mmol/L (ref 135–145)

## 2020-12-12 LAB — GLUCOSE, CAPILLARY
Glucose-Capillary: 324 mg/dL — ABNORMAL HIGH (ref 70–99)
Glucose-Capillary: 426 mg/dL — ABNORMAL HIGH (ref 70–99)

## 2020-12-12 MED ORDER — DOXYCYCLINE HYCLATE 100 MG PO CAPS: 100.0000 mg | ORAL_CAPSULE | Freq: Two times a day (BID) | ORAL | 0 refills | Status: AC

## 2020-12-12 MED ORDER — INSULIN ASPART 100 UNIT/ML IJ SOLN
10.0000 [IU] | Freq: Three times a day (TID) | INTRAMUSCULAR | Status: DC
  Administered 2020-12-12 (×2): 10 [IU] via SUBCUTANEOUS

## 2020-12-12 MED ORDER — PREDNISONE 20 MG PO TABS: ORAL_TABLET | ORAL | 0 refills | Status: DC

## 2020-12-12 MED ORDER — INSULIN GLARGINE 100 UNIT/ML ~~LOC~~ SOLN
20.0000 [IU] | Freq: Every day | SUBCUTANEOUS | Status: DC
  Administered 2020-12-12: 20 [IU] via SUBCUTANEOUS
  Filled 2020-12-12 (×2): qty 0.2

## 2020-12-12 MED ORDER — METHYLPREDNISOLONE SODIUM SUCC 125 MG IJ SOLR: 60.0000 mg | Freq: Two times a day (BID) | INTRAMUSCULAR | Status: DC

## 2020-12-12 MED ORDER — IPRATROPIUM-ALBUTEROL 0.5-2.5 (3) MG/3ML IN SOLN: 3.0000 mL | Freq: Four times a day (QID) | RESPIRATORY_TRACT | 2 refills | Status: DC

## 2020-12-12 MED ORDER — TORSEMIDE 20 MG PO TABS
20.0000 mg | ORAL_TABLET | Freq: Every day | ORAL | Status: DC
Start: 2020-12-13 — End: 2023-09-28

## 2020-12-12 MED ORDER — HYDRALAZINE HCL 50 MG PO TABS: 50.0000 mg | ORAL_TABLET | Freq: Two times a day (BID) | ORAL | Status: DC

## 2020-12-12 NOTE — Evaluation (Signed)
Physical Therapy Evaluation Patient Details Name: Kevin Frank MRN: [id number] DOB: 1940/08/29 Today's Date: 12/12/2020   History of Present Illness  Kevin Frank is a 80 y.o. male who presents for evaluation of shortness of breath, cough, subjective fever and chills at home. COVID and flu negative. Chest x-ray showed bilateral interstitial opacities worst in the left upper lobe. PMH: AAA, asthma, COPD, diabetes, HTN, lung CA,  Clinical Impression  Pt admitted with above diagnosis. Pt reports being independent at baseline, doesn't use DME or home O2. Pt currently limited by fatigue, ambulates 40 ft without AD, desats to 87% and requires seated rest break with pursed lip breathing. Returned 2L O2 with SpO2 92% at EOS- RN notified. Pt currently with functional limitations due to the deficits listed below (see PT Problem List). Pt will benefit from skilled PT to increase their independence and safety with mobility to allow discharge to the venue listed below.       Follow Up Recommendations Home health PT;Supervision - Intermittent    Equipment Recommendations  None recommended by PT    Recommendations for Other Services       Precautions / Restrictions Precautions Precautions: Fall;Other (comment) Precaution Comments: monitor O2 Restrictions Weight Bearing Restrictions: No      Mobility  Bed Mobility Overal bed mobility: Modified Independent    General bed mobility comments: increased time, slightly labored movement    Transfers Overall transfer level: Needs assistance Equipment used: None Transfers: Sit to/from Stand Sit to Stand: Supervision  General transfer comment: supv for safety  Ambulation/Gait Ambulation/Gait assistance: Min guard Gait Distance (Feet): 40 Feet Assistive device: None Gait Pattern/deviations: Step-through pattern;Decreased stride length Gait velocity: slightly decreased   General Gait Details: pt on RA, step through pattern, dyspnea 3/4,  min guard due to slightly unsteady with turns, no LOB, declines RW use, desats to 87% with ambulation, limited by fatigue  Stairs            Wheelchair Mobility    Modified Rankin (Stroke Patients Only)       Balance Overall balance assessment: Needs assistance Sitting-balance support: Feet supported Sitting balance-Leahy Scale: Good Sitting balance - Comments: EOB   Standing balance support: During functional activity;No upper extremity supported Standing balance-Leahy Scale: Fair Standing balance comment: no AD, declines SPC or RW       Pertinent Vitals/Pain Pain Assessment: No/denies pain    Home Living Family/patient expects to be discharged to:: Private residence Living Arrangements: Spouse/significant other Available Help at Discharge: Family;Available 24 hours/day Type of Home: House Home Access: Stairs to enter Entrance Stairs-Rails: None Entrance Stairs-Number of Steps: 2 Home Layout: Two level;Laundry or work area in basement;Able to live on main level with bedroom/bathroom Home Equipment: Environmental consultant - 2 wheels      Prior Function Level of Independence: Independent   Comments: Pt reports independent, drives, completes yardwork.     Hand Dominance   Dominant Hand: Left    Extremity/Trunk Assessment   Upper Extremity Assessment Upper Extremity Assessment: Overall WFL for tasks assessed    Lower Extremity Assessment Lower Extremity Assessment: Overall WFL for tasks assessed    Cervical / Trunk Assessment Cervical / Trunk Assessment: Normal  Communication   Communication: HOH  Cognition Arousal/Alertness: Awake/alert Behavior During Therapy: WFL for tasks assessed/performed Overall Cognitive Status: Within Functional Limits for tasks assessed  General Comments: Pt very HOH, a&ox4      General Comments General comments (skin integrity, edema, etc.): Pt desats on RA to 87%, returned  2L with SpO2 92% at EOS- RN notified    Exercises      Assessment/Plan    PT Assessment Patient needs continued PT services  PT Problem List Decreased activity tolerance;Decreased balance;Cardiopulmonary status limiting activity       PT Treatment Interventions DME instruction;Gait training;Stair training;Functional mobility training;Therapeutic activities;Therapeutic exercise;Balance training;Patient/family education    PT Goals (Current goals can be found in the Care Plan section)  Acute Rehab PT Goals Patient Stated Goal: "go home today" PT Goal Formulation: With patient Time For Goal Achievement: 12/26/20 Potential to Achieve Goals: Good    Frequency Min 3X/week   Barriers to discharge        Co-evaluation               AM-PAC PT "6 Clicks" Mobility  Outcome Measure Help needed turning from your back to your side while in a flat bed without using bedrails?: None Help needed moving from lying on your back to sitting on the side of a flat bed without using bedrails?: None Help needed moving to and from a bed to a chair (including a wheelchair)?: A Little Help needed standing up from a chair using your arms (e.g., wheelchair or bedside chair)?: A Little Help needed to walk in hospital room?: A Little Help needed climbing 3-5 steps with a railing? : A Little 6 Click Score: 20    End of Session Equipment Utilized During Treatment: Oxygen;Gait belt Activity Tolerance: Patient tolerated treatment well Patient left: in chair;with call bell/phone within reach Nurse Communication: Mobility status;Other (comment) (SpO2 on RA, returned 2L at EOS) PT Visit Diagnosis: Other abnormalities of gait and mobility (R26.89)    Time: 7371-0626 PT Time Calculation (min) (ACUTE ONLY): 16 min   Charges:   PT Evaluation $PT Eval Low Complexity: 1 Low           Tori Cailin Gebel PT, DPT 12/12/20, 12:19 PM

## 2020-12-12 NOTE — TOC Transition Note (Addendum)
Transition of Care [facility]) - CM/SW Discharge Note   Patient Details  Name: Kevin Frank MRN: [id number] Date of Birth: 07/04/1940  Transition of Care [facility]) CM/SW Contact:  Shade Flood, LCSW Phone Number: 12/12/2020, 1:15 PM   Clinical Narrative:     Pt from home with wife. MD states pt is stable for dc home today. Pt with orders for HH and O2 for dc. Spoke with pt and his wife to review dc planning and they are in agreement with HH and O2. Informed of CMS provider options and referred as requested. [facility] to deliver portable O2 to the room for dc.  Updated RN and MD.  No other TOC needs for dc.  1537: Received Message from MD stating that pt needs neb machine as well. Contacted Ashly at [place] to update. [facility] will deliver to pt's home.  Expected Discharge Plan: [place] Barriers to Discharge: Barriers Resolved   Patient Goals and CMS Choice Patient states their goals for this hospitalization and ongoing recovery are:: go home CMS Medicare.gov Compare Post Acute Care list provided to:: Patient Choice offered to / list presented to : Patient  Expected Discharge Plan and Services Expected Discharge Plan: [place] In-house Referral: Clinical Social Work   Post Acute Care Choice: Home Health, Durable Medical Equipment Living arrangements for the past 2 months: [place] Expected Discharge Date: 12/12/20               DME Arranged: Oxygen DME Agency: [employer] Date DME Agency Contacted: 12/12/20   Representative spoke with at DME Agency: Leatrice Jewels HH Arranged: RN, PT, Social Work [employer] Agency: [place] Date [facility] Agency Contacted: 12/12/20   Representative spoke with at [place]: Tommi Rumps  Prior Living Arrangements/Services Living arrangements for the past 2 months: [place] Lives with:: Spouse Patient language and need for interpreter reviewed:: Yes Do you feel safe going back to the place where you  live?: Yes      Need for Family Participation in Patient Care: Yes (Comment) Care giver support system in place?: Yes (comment)   Criminal Activity/Legal Involvement Pertinent to Current Situation/Hospitalization: No - Comment as needed  Activities of Daily Living Home Assistive Devices/Equipment: Hearing aid ADL Screening (condition at time of admission) Patient's cognitive ability adequate to safely complete daily activities?: Yes Is the patient deaf or have difficulty hearing?: Yes Does the patient have difficulty seeing, even when wearing glasses/contacts?: No Does the patient have difficulty concentrating, remembering, or making decisions?: No Patient able to express need for assistance with ADLs?: Yes Does the patient have difficulty dressing or bathing?: No Independently performs ADLs?: Yes (appropriate for developmental age) Does the patient have difficulty walking or climbing stairs?: No Weakness of Legs: None Weakness of Arms/Hands: None  Permission Sought/Granted Permission sought to share information with : Chartered certified accountant granted to share information with : Yes, Verbal Permission Granted     Permission granted to share info w AGENCY: [place], DME agencies        Emotional Assessment       Orientation: : Oriented to Self, Oriented to Place, Oriented to  Time, Oriented to Situation Alcohol / Substance Use: Not Applicable Psych Involvement: No (comment)  Admission diagnosis:  Acute respiratory failure ([place]) [J96.00] Community acquired pneumonia of left upper lobe of lung [J18.9] Sepsis without acute organ dysfunction, due to unspecified organism [facility]) [A41.9] Patient Active Problem List   Diagnosis Date Noted  Lobar pneumonia ([place]) 12/11/2020   Acute respiratory failure ([place]) 12/10/2020   Wrist pain, acute, right 09/07/2019   Right forearm pain 09/07/2019   Uncontrolled type 2 diabetes mellitus with hyperglycemia ([place]) 06/27/2019   Mixed  hyperlipidemia 06/27/2019   Class 1 obesity due to excess calories with serious comorbidity and body mass index (BMI) of 32.0 to 32.9 in adult 06/27/2019   Diabetes mellitus without complication ([place]) 47/34/0370   Positive colorectal cancer screening using Cologuard test 06/14/2019   Squamous cell carcinoma of right lung ([place]) 03/29/2018   Seasonal and perennial allergic rhinitis 09/22/2017   Asthma-COPD overlap syndrome ([place]) 09/22/2017   Obstructive sleep apnea on CPAP 04/23/2017   Chronic knee pain 04/23/2017   AKI (acute kidney injury) ([place]) 04/23/2017   Chronic diastolic CHF (congestive heart failure) ([place]) 04/23/2017   Cellulitis 04/23/2017   Cellulitis of left lower leg    Current smoker 03/05/2016   Incidental lung nodule, > 22mm and < 22mm 03/05/2016   Stage 2 moderate COPD by GOLD classification ([place]) 03/05/2016   AAA (abdominal aortic aneurysm) without rupture ([place]) 06/14/2015   OBSTRUCTIVE SLEEP APNEA 06/09/2007   HYPERCHOLESTEROLEMIA 05/06/2007   GOUT 05/06/2007   DEPRESSION 05/06/2007   Essential hypertension, benign 05/06/2007   ABDOMINAL AORTIC ANEURYSM 05/06/2007   BENIGN PROSTATIC HYPERTROPHY, HX OF 05/06/2007   PCP:  Sharilyn Sites, MD Pharmacy:   [id number] - [place], [address] AT [address] [place] [address] Phone: [phone] Fax: [phone]     Social Determinants of Health (SDOH) Interventions    Readmission Risk Interventions No flowsheet data found.   Final next level of care: [employer] Barriers to Discharge: Barriers Resolved   Patient Goals and CMS Choice Patient states their goals for this hospitalization and ongoing recovery are:: go home CMS Medicare.gov Compare Post Acute Care list provided to:: Patient Choice offered to / list presented to : Patient  Discharge Placement                       Discharge Plan and Services In-house Referral: Clinical Social Work    Post Acute Care Choice: Home Health, Durable Medical Equipment          DME Arranged: Oxygen DME Agency: [employer] Date DME Agency Contacted: 12/12/20   Representative spoke with at DME Agency: Leatrice Jewels HH Arranged: RN, PT, Social Work [employer] Agency: [place] Date [facility] Agency Contacted: 12/12/20   Representative spoke with at [place]: [place] ([place]) Interventions     Readmission Risk Interventions No flowsheet data found.

## 2020-12-12 NOTE — Progress Notes (Addendum)
SATURATION QUALIFICATIONS: (This note is used to comply with regulatory documentation for home oxygen)  Patient Saturations on Room Air at Rest = 92%  Patient Saturations on Room Air while Ambulating = 87%  Patient Saturations on 3 Liters of oxygen while Ambulating = 90%  Please briefly explain why patient needs home oxygen: Patient desaturates below 88% while ambulating.

## 2020-12-12 NOTE — Progress Notes (Signed)
Pt placed back on 3lpm cann after treatment, pt became confused and ripped off CPAP machine, pulled heart monitor and pulled out iv. Pt told nurse that I was "the cleaning lady that brought the cpap machine in"

## 2020-12-12 NOTE — Progress Notes (Signed)
[name] to be D/C'd home with [facility] per MD order. Discussed with the patient and all questions fully answered.  Skin clean, dry and intact without evidence of skin break down, no evidence of skin tears noted.  IV catheter discontinued intact. Site without signs and symptoms of complications. Dressing and pressure applied.  An After Visit Summary was printed and given to the patient.  Patient escorted via [place], and D/C home via private auto. Portable O2 sent with patient and instructed on use.  Melonie [place]  12/12/2020 4:04 PM

## 2020-12-12 NOTE — Discharge Summary (Addendum)
Physician Discharge Summary  Kevin Frank LKG:[id number] DOB: 05/03/1941 DOA: 12/10/2020  PCP: Sharilyn Sites, MD  Admit date: 12/10/2020 Discharge date: 12/12/2020  Disposition:  Home   Recommendations for Outpatient Follow-up:  Follow up with PCP in 1 weeks Ambulatory referral to pulmonology  Discharge Condition: STABLE   CODE STATUS: FULL  DIET: heart healthy    Brief Hospitalization Summary: Please see all hospital notes, images, labs for full details of the hospitalization. ADMISSION HPI:   Kevin Frank is a 80 y.o. male with medical history significant of AAA, COPD/Asthma, Gout, DM, HTN, HLD, nephrolithiasis, Stage 1 SCC  RUL lung. Pt presenting w/ 3-4 day h/o cough and progressive SOB.  Went to [facility] today at [employer] of family and noted to have O2 in the 80s. Given Solumedrol and Duonebs enroute to ED w/ improvement. Remains on O2 in order to maintain saturations above 90%. Associated w/ sugjective fevers and chills.    HOSPITAL COURSE BY PROBLEM  Acute respiratory failure with hypoxia -Secondary to pneumonia and COPD exacerbation -Currently stable on 3 L nasal cannula -Wean oxygen as tolerated for saturation greater 90% -Personally reviewed chest x-ray-increased interstitial markings worst LUL PT WILL DISCHARGE HOME ON 3L/MIN OXYGEN.    COPD exacerbation - IMPROVED -treated with Pulmicort and duonebs -treated with IV Solu-Medrol - DC home on prednisone taper   Lobar pneumonia -Check PCT -CT chest:  IMPRESSION: 1. The appearance of the lungs is compatible with multilobar bilateral bronchopneumonia, most involved in the left upper lobe. 2. Mild diffuse bronchial wall thickening with mild to moderate centrilobular and paraseptal emphysema; imaging findings suggestive of underlying COPD. 3. Aortic atherosclerosis, in addition to left main and 3 vessel coronary artery disease. Assessment for potential risk factor modification, dietary therapy or pharmacologic  therapy may be warranted, if clinically indicated. 4. There are calcifications of the aortic valve. Echocardiographic correlation for evaluation of potential valvular dysfunction may be warranted if clinically indicated. -treated with ceftriaxone azithromycin and discharge home on oral doxycycline   Diabetes mellitus type 2 -NovoLog sliding scale and basal bolus insulin given -Anticipate elevated CBGs secondary to steroids -Hemoglobin A1c -resume home metformin at discharge   Chronic diastolic CHF -resume home torsemide -Judicious IV fluids -Clinically euvolemic on exam -11/15/2019 echo EF 60 to 65%, grade 1 DD, no WMA; PASP 41.7   Essential hypertension -Continue amlodipine -Continue metoprolol succinate -Continue hydralazine and losartan   Depression/anxiety -Continue sertraline   Hyperlipidemia -Continue statin   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 Delton Coombes   Tobacco abuse -Tobacco cessation discussed   Discharge Diagnoses:  Active Problems:   HYPERCHOLESTEROLEMIA   Essential hypertension, benign   AAA (abdominal aortic aneurysm) without rupture (HCC)   AKI (acute kidney injury) ([place])   Chronic diastolic CHF (congestive heart failure) (HCC)   Asthma-COPD overlap syndrome (HCC)   Squamous cell carcinoma of right lung (HCC)   Diabetes mellitus without complication ([place])   Acute respiratory failure ([place])   Lobar pneumonia [facility])   Discharge Instructions: Discharge Instructions     Ambulatory referral to Pulmonology   Complete by: As directed    Reason for referral: Asthma/COPD      Allergies as of 12/12/2020       Reactions   No Known Allergies         Medication List     STOP taking these medications    polyethylene glycol-electrolytes 420 g solution Commonly known as: TriLyte  TAKE these medications    albuterol 108 (90 Base) MCG/ACT inhaler Commonly known as: VENTOLIN HFA Use 4  puffs every 4-6 hours as needed for cough or wheeze   allopurinol 300 MG tablet Commonly known as: ZYLOPRIM Take 1 tablet (300 mg total) by mouth daily.   amLODipine 10 MG tablet Commonly known as: NORVASC Take 1 tablet (10 mg total) by mouth daily.   atorvastatin 20 MG tablet Commonly known as: LIPITOR Take 1 tablet (20 mg total) by mouth daily.   Breztri Aerosphere 160-9-4.8 MCG/ACT Aero Generic drug: Budeson-Glycopyrrol-Formoterol Inhale 2 puffs into the lungs 2 (two) times daily.   clopidogrel 75 MG tablet Commonly known as: PLAVIX Take 1 tablet (75 mg total) by mouth daily.   doxycycline 100 MG capsule Commonly known as: VIBRAMYCIN Take 1 capsule (100 mg total) by mouth 2 (two) times daily for 5 days.   hydrALAZINE 50 MG tablet Commonly known as: APRESOLINE Take 1 tablet (50 mg total) by mouth in the morning and at bedtime.   ipratropium-albuterol 0.5-2.5 (3) MG/3ML Soln Commonly known as: DUONEB Take 3 mLs by nebulization every 6 (six) hours.   metFORMIN 500 MG tablet Commonly known as: GLUCOPHAGE Take 1,000 mg by mouth 2 (two) times daily with a meal.   metoprolol succinate 25 MG 24 hr tablet Commonly known as: TOPROL-XL Take 1 tablet (25 mg total) by mouth daily.   predniSONE 20 MG tablet Commonly known as: DELTASONE Take 3 PO QAM x3days, 2 PO QAM x3days, 1 PO QAM x3days Start taking on: December 13, 2020   sertraline 50 MG tablet Commonly known as: ZOLOFT Take 1 tablet (50 mg total) by mouth daily.   torsemide 20 MG tablet Commonly known as: DEMADEX Take 1 tablet (20 mg total) by mouth daily. Start taking on: December 13, 2020       ASK your doctor about these medications    losartan 100 MG tablet Commonly known as: COZAAR TAKE 1 TABLET BY MOUTH ONCE DAILY.               Durable Medical Equipment  (From admission, onward)           Start     Ordered   12/12/20 1537  For home use only DME Nebulizer machine  Once       Comments: COPD  J44.9  Question Answer Comment  Patient needs a nebulizer to treat with the following condition COPD with acute exacerbation ([place])   Length of Need Lifetime      12/12/20 1536   12/12/20 1303  For home use only DME oxygen  Once       Comments: COPD J44.9  Question Answer Comment  Length of Need Lifetime   Mode or (Route) Nasal cannula   Liters per Minute 3   Frequency Continuous (stationary and portable oxygen unit needed)   Oxygen conserving device Yes   Oxygen delivery system Gas      12/12/20 1302            Follow-up Information     [facility] Follow up.   Specialty: [place] Why: [facility] Staff will call you to schedule in home visits Contact information: [address] [place] [address] [phone]                Allergies  Allergen Reactions   No Known Allergies    Allergies as of 12/12/2020       Reactions  No Known Allergies         Medication List     STOP taking these medications    polyethylene glycol-electrolytes 420 g solution Commonly known as: TriLyte       TAKE these medications    albuterol 108 (90 Base) MCG/ACT inhaler Commonly known as: VENTOLIN HFA Use 4 puffs every 4-6 hours as needed for cough or wheeze   allopurinol 300 MG tablet Commonly known as: ZYLOPRIM Take 1 tablet (300 mg total) by mouth daily.   amLODipine 10 MG tablet Commonly known as: NORVASC Take 1 tablet (10 mg total) by mouth daily.   atorvastatin 20 MG tablet Commonly known as: LIPITOR Take 1 tablet (20 mg total) by mouth daily.   Breztri Aerosphere 160-9-4.8 MCG/ACT Aero Generic drug: Budeson-Glycopyrrol-Formoterol Inhale 2 puffs into the lungs 2 (two) times daily.   clopidogrel 75 MG tablet Commonly known as: PLAVIX Take 1 tablet (75 mg total) by mouth daily.   doxycycline 100 MG capsule Commonly known as: VIBRAMYCIN Take 1 capsule (100 mg total) by mouth 2 (two) times daily for 5  days.   hydrALAZINE 50 MG tablet Commonly known as: APRESOLINE Take 1 tablet (50 mg total) by mouth in the morning and at bedtime.   ipratropium-albuterol 0.5-2.5 (3) MG/3ML Soln Commonly known as: DUONEB Take 3 mLs by nebulization every 6 (six) hours.   metFORMIN 500 MG tablet Commonly known as: GLUCOPHAGE Take 1,000 mg by mouth 2 (two) times daily with a meal.   metoprolol succinate 25 MG 24 hr tablet Commonly known as: TOPROL-XL Take 1 tablet (25 mg total) by mouth daily.   predniSONE 20 MG tablet Commonly known as: DELTASONE Take 3 PO QAM x3days, 2 PO QAM x3days, 1 PO QAM x3days Start taking on: December 13, 2020   sertraline 50 MG tablet Commonly known as: ZOLOFT Take 1 tablet (50 mg total) by mouth daily.   torsemide 20 MG tablet Commonly known as: DEMADEX Take 1 tablet (20 mg total) by mouth daily. Start taking on: December 13, 2020       ASK your doctor about these medications    losartan 100 MG tablet Commonly known as: COZAAR TAKE 1 TABLET BY MOUTH ONCE DAILY.               Durable Medical Equipment  (From admission, onward)           Start     Ordered   12/12/20 1537  For home use only DME Nebulizer machine  Once       Comments: COPD J44.9  Question Answer Comment  Patient needs a nebulizer to treat with the following condition COPD with acute exacerbation ([place])   Length of Need Lifetime      12/12/20 1536   12/12/20 1303  For home use only DME oxygen  Once       Comments: COPD J44.9  Question Answer Comment  Length of Need Lifetime   Mode or (Route) Nasal cannula   Liters per Minute 3   Frequency Continuous (stationary and portable oxygen unit needed)   Oxygen conserving device Yes   Oxygen delivery system Gas      12/12/20 1302            Procedures/Studies: CT CHEST WO CONTRAST  Result Date: 12/11/2020 CLINICAL DATA:  80 year old male with history of persistent cough. Fever. COPD exacerbation. EXAM: CT CHEST WITHOUT CONTRAST  TECHNIQUE: Multidetector CT imaging of the chest was performed following the standard protocol without  IV contrast. COMPARISON:  Chest CT 10/24/2020. FINDINGS: Cardiovascular: Heart size is normal. There is no significant pericardial fluid, thickening or pericardial calcification. There is aortic atherosclerosis, as well as atherosclerosis of the great vessels of the mediastinum and the coronary arteries, including calcified atherosclerotic plaque in the left main, left anterior descending, left circumflex and right coronary arteries. Calcifications of the aortic valve. Mediastinum/Nodes: No pathologically enlarged mediastinal or hilar lymph nodes. Please note that accurate exclusion of hilar adenopathy is limited on noncontrast CT scans. Esophagus is unremarkable in appearance. No axillary lymphadenopathy. Lungs/Pleura: Patchy multifocal peribronchovascular predominant airspace consolidation is noted in the lungs bilaterally, most notably in the left upper lobe, indicative of multilobar bilateral bronchopneumonia. No pleural effusions. Diffuse bronchial wall thickening with mild to moderate centrilobular and paraseptal emphysema. Upper Abdomen: Incompletely imaged stent graft in the abdominal aorta. Musculoskeletal: There are no aggressive appearing lytic or blastic lesions noted in the visualized portions of the skeleton. IMPRESSION: 1. The appearance of the lungs is compatible with multilobar bilateral bronchopneumonia, most involved in the left upper lobe. 2. Mild diffuse bronchial wall thickening with mild to moderate centrilobular and paraseptal emphysema; imaging findings suggestive of underlying COPD. 3. Aortic atherosclerosis, in addition to left main and 3 vessel coronary artery disease. Assessment for potential risk factor modification, dietary therapy or pharmacologic therapy may be warranted, if clinically indicated. 4. There are calcifications of the aortic valve. Echocardiographic correlation for  evaluation of potential valvular dysfunction may be warranted if clinically indicated. Aortic Atherosclerosis (ICD10-I70.0) and Emphysema (ICD10-J43.9). Electronically Signed   By: Vinnie Langton M.D.   On: 12/11/2020 14:47   DG Chest Portable 1 View  Result Date: 12/10/2020 CLINICAL DATA:  Fever and shortness of breath for 4 days. History of COPD and lung carcinoma. EXAM: PORTABLE CHEST 1 VIEW COMPARISON:  01/18/2019 and older exams.  Chest CT, 10/25/2018. FINDINGS: There are bilateral interstitial opacities, most prominent in the left upper lobe, increased compared to the chest radiograph from 01/18/2019, but similar to the more recent prior chest CT. No focal lung consolidation. Lungs are hyperexpanded. No pleural effusion or pneumothorax. Heart is normal in size.  No mediastinal or hilar masses. Skeletal structures are grossly intact. IMPRESSION: 1. Bilateral interstitial lung opacities that are most evident in the left upper lobe. Findings appears similar to the topogram from the chest CT dated 10/24/2020 and may all be chronic. Acute interstitial infection, particularly in the left upper lobe, however, should be considered given the patient's symptoms. 2. Underlying COPD/emphysema. Electronically Signed   By: Lajean Manes M.D.   On: 12/10/2020 15:58     Subjective: Pt reports feeling much better today.  He has been ambulating in room.  He says he feels like he is at his baseline and he feels like he can go to work today.  He would like to go home.    Discharge Exam: Vitals:   12/12/20 0750 12/12/20 1456  BP:    Pulse:    Resp:    Temp:    SpO2: 95% 94%   Vitals:   12/12/20 0454 12/12/20 0737 12/12/20 0750 12/12/20 1456  BP: (!) 152/79     Pulse: 85     Resp: 20     Temp: 98.7 F (37.1 C)     TempSrc:      SpO2: 96% 95% 95% 94%  Weight:      Height:       General: chronically ill appearing, Pt is alert, awake, not in acute  distress Cardiovascular: normal S1/S2 +, no rubs, no  gallops Respiratory: no rales, no rhonchi Abdominal: Soft, NT, ND, bowel sounds + Extremities: no edema, no cyanosis   The results of significant diagnostics from this hospitalization (including imaging, microbiology, ancillary and laboratory) are listed below for reference.     Microbiology: Recent Results (from the past 240 hour(s))  Resp Panel by RT-PCR (Flu A&B, Covid) Nasopharyngeal Swab     Status: None   Collection Time: 12/10/20  3:16 PM   Specimen: Nasopharyngeal Swab; Nasopharyngeal(NP) swabs in vial transport medium  Result Value Ref Range Status   SARS Coronavirus 2 by RT PCR NEGATIVE NEGATIVE Final    Comment: (NOTE) SARS-CoV-2 target nucleic acids are NOT DETECTED.  The SARS-CoV-2 RNA is generally detectable in upper respiratory specimens during the acute phase of infection. The lowest concentration of SARS-CoV-2 viral copies this assay can detect is 138 copies/mL. A negative result does not preclude SARS-Cov-2 infection and should not be used as the sole basis for treatment or other patient management decisions. A negative result may occur with  improper specimen collection/handling, submission of specimen other than nasopharyngeal swab, presence of viral mutation(s) within the areas targeted by this assay, and inadequate number of viral copies(<138 copies/mL). A negative result must be combined with clinical observations, patient history, and epidemiological information. The expected result is Negative.  Fact Sheet for Patients:  EntrepreneurPulse.com.au  Fact Sheet for Healthcare Providers:  IncredibleEmployment.be  This test is no t yet approved or cleared by the [place] FDA and  has been authorized for detection and/or diagnosis of SARS-CoV-2 by FDA under an Emergency Use Authorization (EUA). This EUA will remain  in effect (meaning this test can be used) for the duration of the COVID-19 declaration under Section  564(b)(1) of the Act, 21 U.S.C.section 360bbb-3(b)(1), unless the authorization is terminated  or revoked sooner.       Influenza A by PCR NEGATIVE NEGATIVE Final   Influenza B by PCR NEGATIVE NEGATIVE Final    Comment: (NOTE) The Xpert Xpress SARS-CoV-2/FLU/RSV plus assay is intended as an aid in the diagnosis of influenza from Nasopharyngeal swab specimens and should not be used as a sole basis for treatment. Nasal washings and aspirates are unacceptable for Xpert Xpress SARS-CoV-2/FLU/RSV testing.  Fact Sheet for Patients: EntrepreneurPulse.com.au  Fact Sheet for Healthcare Providers: IncredibleEmployment.be  This test is not yet approved or cleared by the [place] FDA and has been authorized for detection and/or diagnosis of SARS-CoV-2 by FDA under an Emergency Use Authorization (EUA). This EUA will remain in effect (meaning this test can be used) for the duration of the COVID-19 declaration under Section 564(b)(1) of the Act, 21 U.S.C. section 360bbb-3(b)(1), unless the authorization is terminated or revoked.  Performed at [facility], [address] [place], [place] [address]   Blood culture (routine x 2)     Status: None (Preliminary result)   Collection Time: 12/10/20  3:58 PM   Specimen: BLOOD  Result Value Ref Range Status   Specimen Description BLOOD BLOOD LEFT HAND  Final   Special Requests   Final    Blood Culture adequate volume BOTTLES DRAWN AEROBIC AND ANAEROBIC   Culture   Final    NO GROWTH < 24 HOURS Performed at [facility] [address], [place], [place] [address]    Report Status PENDING  Incomplete  Blood culture (routine x 2)     Status: None (Preliminary result)   Collection Time: 12/10/20  3:58 PM  Specimen: BLOOD  Result Value Ref Range Status   Specimen Description BLOOD BLOOD RIGHT ARM  Final   Special Requests   Final    Blood Culture adequate volume BOTTLES DRAWN AEROBIC AND ANAEROBIC    Culture   Final    NO GROWTH < 24 HOURS Performed at [facility], [address] [place], [place] [address]    Report Status PENDING  Incomplete     Labs: BNP (last 3 results) No results for input(s): BNP in the last 8760 hours. Basic Metabolic Panel: Recent Labs  Lab 12/10/20 1558 12/10/20 2002 12/11/20 0522 12/12/20 0454  NA 131*  --  130* 135  K 3.4*  --  3.7 4.0  CL 97*  --  97* 103  CO2 24  --  24 23  GLUCOSE 219*  --  367* 300*  BUN 12  --  18 25*  CREATININE 0.86 0.96 0.90 0.83  CALCIUM 8.9  --  8.9 9.1   Liver Function Tests: Recent Labs  Lab 12/10/20 1558 12/11/20 0522  AST 19 19  ALT 19 23  ALKPHOS 87 96  BILITOT 0.7 0.6  PROT 7.0 6.7  ALBUMIN 3.3* 3.1*   No results for input(s): LIPASE, AMYLASE in the last 168 hours. No results for input(s): AMMONIA in the last 168 hours. CBC: Recent Labs  Lab 12/10/20 1558 12/10/20 2002 12/11/20 0522 12/12/20 0454  WBC 18.0* 16.9* 15.0* 19.8*  NEUTROABS 15.1*  --   --   --   HGB 14.9 14.3 14.3 15.0  HCT 43.6 42.1 43.0 44.1  MCV 93.6 94.6 94.5 95.0  PLT 147* 134* 142* 176   Cardiac Enzymes: No results for input(s): CKTOTAL, CKMB, CKMBINDEX, TROPONINI in the last 168 hours. BNP: Invalid input(s): POCBNP CBG: Recent Labs  Lab 12/11/20 1202 12/11/20 1615 12/11/20 2123 12/12/20 0710 12/12/20 1103  GLUCAP 306* 281* 277* 324* 426*   D-Dimer No results for input(s): DDIMER in the last 72 hours. Hgb A1c Recent Labs    12/10/20 2002  HGBA1C 8.1*   Lipid Profile No results for input(s): CHOL, HDL, LDLCALC, TRIG, CHOLHDL, LDLDIRECT in the last 72 hours. Thyroid function studies No results for input(s): TSH, T4TOTAL, T3FREE, THYROIDAB in the last 72 hours.  Invalid input(s): FREET3 Anemia work up No results for input(s): VITAMINB12, FOLATE, FERRITIN, TIBC, IRON, RETICCTPCT in the last 72 hours. Urinalysis    Component Value Date/Time   COLORURINE YELLOW 04/02/2016 1037   APPEARANCEUR CLEAR  04/02/2016 1037   LABSPEC 1.006 04/02/2016 1037   PHURINE 6.0 04/02/2016 1037   GLUCOSEU NEGATIVE 04/02/2016 1037   HGBUR NEGATIVE 04/02/2016 [place] 04/02/2016 1037   KETONESUR NEGATIVE 04/02/2016 1037   PROTEINUR NEGATIVE 04/02/2016 1037   NITRITE NEGATIVE 04/02/2016 1037   LEUKOCYTESUR SMALL (A) 04/02/2016 1037   Sepsis Labs Invalid input(s): PROCALCITONIN,  WBC,  LACTICIDVEN Microbiology Recent Results (from the past 240 hour(s))  Resp Panel by RT-PCR (Flu A&B, Covid) Nasopharyngeal Swab     Status: None   Collection Time: 12/10/20  3:16 PM   Specimen: Nasopharyngeal Swab; Nasopharyngeal(NP) swabs in vial transport medium  Result Value Ref Range Status   SARS Coronavirus 2 by RT PCR NEGATIVE NEGATIVE Final    Comment: (NOTE) SARS-CoV-2 target nucleic acids are NOT DETECTED.  The SARS-CoV-2 RNA is generally detectable in upper respiratory specimens during the acute phase of infection. The lowest concentration of SARS-CoV-2 viral copies this assay can detect is 138 copies/mL. A negative result does  not preclude SARS-Cov-2 infection and should not be used as the sole basis for treatment or other patient management decisions. A negative result may occur with  improper specimen collection/handling, submission of specimen other than nasopharyngeal swab, presence of viral mutation(s) within the areas targeted by this assay, and inadequate number of viral copies(<138 copies/mL). A negative result must be combined with clinical observations, patient history, and epidemiological information. The expected result is Negative.  Fact Sheet for Patients:  EntrepreneurPulse.com.au  Fact Sheet for Healthcare Providers:  IncredibleEmployment.be  This test is no t yet approved or cleared by the [place] FDA and  has been authorized for detection and/or diagnosis of SARS-CoV-2 by FDA under an Emergency Use Authorization (EUA).  This EUA will remain  in effect (meaning this test can be used) for the duration of the COVID-19 declaration under Section 564(b)(1) of the Act, 21 U.S.C.section 360bbb-3(b)(1), unless the authorization is terminated  or revoked sooner.       Influenza A by PCR NEGATIVE NEGATIVE Final   Influenza B by PCR NEGATIVE NEGATIVE Final    Comment: (NOTE) The Xpert Xpress SARS-CoV-2/FLU/RSV plus assay is intended as an aid in the diagnosis of influenza from Nasopharyngeal swab specimens and should not be used as a sole basis for treatment. Nasal washings and aspirates are unacceptable for Xpert Xpress SARS-CoV-2/FLU/RSV testing.  Fact Sheet for Patients: EntrepreneurPulse.com.au  Fact Sheet for Healthcare Providers: IncredibleEmployment.be  This test is not yet approved or cleared by the [place] FDA and has been authorized for detection and/or diagnosis of SARS-CoV-2 by FDA under an Emergency Use Authorization (EUA). This EUA will remain in effect (meaning this test can be used) for the duration of the COVID-19 declaration under Section 564(b)(1) of the Act, 21 U.S.C. section 360bbb-3(b)(1), unless the authorization is terminated or revoked.  Performed at [facility], [address] [place], [place] [address]   Blood culture (routine x 2)     Status: None (Preliminary result)   Collection Time: 12/10/20  3:58 PM   Specimen: BLOOD  Result Value Ref Range Status   Specimen Description BLOOD BLOOD LEFT HAND  Final   Special Requests   Final    Blood Culture adequate volume BOTTLES DRAWN AEROBIC AND ANAEROBIC   Culture   Final    NO GROWTH < 24 HOURS Performed at [facility], [address] [place], [place] [address]    Report Status PENDING  Incomplete  Blood culture (routine x 2)     Status: None (Preliminary result)   Collection Time: 12/10/20  3:58 PM   Specimen: BLOOD  Result Value Ref Range Status   Specimen Description BLOOD BLOOD  RIGHT ARM  Final   Special Requests   Final    Blood Culture adequate volume BOTTLES DRAWN AEROBIC AND ANAEROBIC   Culture   Final    NO GROWTH < 24 HOURS Performed at [facility], [address] [place], [place] [address]    Report Status PENDING  Incomplete   Time coordinating discharge: 35 mins   SIGNED:  Irwin Brakeman, MD  [facility] 12/12/2020, 3:40 PM How to contact the [facility] Attending or Consulting provider [place] or covering provider during after hours [place], for this patient?  Check the care team in [facility] and look for a) attending/consulting [facility] provider listed and b) the [facility] team listed Log into [email or web address] and use 's universal password to access. If you do not have the password, please contact the hospital  operator. Locate the [facility] provider you are looking for under [facility] and page to a number that you can be directly reached. If you still have difficulty reaching the provider, please page the [facility] (Director on Call) for the Hospitalists listed on amion for assistance.

## 2020-12-13 NOTE — Telephone Encounter (Signed)
Patients wife called the office to give update on patient and discuss Xolair injection on Friday. Per wife, patient has been discharged from the hospital and on oxygen, Doxycycline x 5 days and Prednisone tapered dose. Patient will follow up with [facility] is supposed to come to the house for home visits.  Xoliar injection for 12/14/20 has been rescheduled to 12/19/20.  Patient or wife will call on 12/18/20 to give update on how he is doing.

## 2020-12-14 ENCOUNTER — Ambulatory Visit: Payer: Self-pay

## 2020-12-14 NOTE — Telephone Encounter (Signed)
I think Pulmonology is a good idea. He has seen them in the past.   Thanks for checking on him, Eustaquio Maize!   Salvatore Marvel, MD [facility] [place]

## 2020-12-15 DIAGNOSIS — N179 Acute kidney failure, unspecified: Secondary | ICD-10-CM | POA: Diagnosis not present

## 2020-12-15 DIAGNOSIS — J181 Lobar pneumonia, unspecified organism: Secondary | ICD-10-CM | POA: Diagnosis not present

## 2020-12-15 DIAGNOSIS — E119 Type 2 diabetes mellitus without complications: Secondary | ICD-10-CM | POA: Diagnosis not present

## 2020-12-15 DIAGNOSIS — J9601 Acute respiratory failure with hypoxia: Secondary | ICD-10-CM | POA: Diagnosis not present

## 2020-12-15 DIAGNOSIS — I110 Hypertensive heart disease with heart failure: Secondary | ICD-10-CM | POA: Diagnosis not present

## 2020-12-15 DIAGNOSIS — I5032 Chronic diastolic (congestive) heart failure: Secondary | ICD-10-CM | POA: Diagnosis not present

## 2020-12-15 DIAGNOSIS — I2510 Atherosclerotic heart disease of native coronary artery without angina pectoris: Secondary | ICD-10-CM | POA: Diagnosis not present

## 2020-12-15 DIAGNOSIS — I700 Atherosclerosis of aorta: Secondary | ICD-10-CM | POA: Diagnosis not present

## 2020-12-15 DIAGNOSIS — J432 Centrilobular emphysema: Secondary | ICD-10-CM | POA: Diagnosis not present

## 2020-12-15 LAB — CULTURE, BLOOD (ROUTINE X 2)
Culture: NO GROWTH
Culture: NO GROWTH
Special Requests: ADEQUATE
Special Requests: ADEQUATE

## 2020-12-17 DIAGNOSIS — J189 Pneumonia, unspecified organism: Secondary | ICD-10-CM | POA: Diagnosis not present

## 2020-12-17 DIAGNOSIS — Z6831 Body mass index (BMI) 31.0-31.9, adult: Secondary | ICD-10-CM | POA: Diagnosis not present

## 2020-12-17 DIAGNOSIS — E6609 Other obesity due to excess calories: Secondary | ICD-10-CM | POA: Diagnosis not present

## 2020-12-17 DIAGNOSIS — E118 Type 2 diabetes mellitus with unspecified complications: Secondary | ICD-10-CM | POA: Diagnosis not present

## 2020-12-18 DIAGNOSIS — I700 Atherosclerosis of aorta: Secondary | ICD-10-CM | POA: Diagnosis not present

## 2020-12-18 DIAGNOSIS — I2510 Atherosclerotic heart disease of native coronary artery without angina pectoris: Secondary | ICD-10-CM | POA: Diagnosis not present

## 2020-12-18 DIAGNOSIS — I110 Hypertensive heart disease with heart failure: Secondary | ICD-10-CM | POA: Diagnosis not present

## 2020-12-18 DIAGNOSIS — I5032 Chronic diastolic (congestive) heart failure: Secondary | ICD-10-CM | POA: Diagnosis not present

## 2020-12-18 DIAGNOSIS — J432 Centrilobular emphysema: Secondary | ICD-10-CM | POA: Diagnosis not present

## 2020-12-18 DIAGNOSIS — J181 Lobar pneumonia, unspecified organism: Secondary | ICD-10-CM | POA: Diagnosis not present

## 2020-12-18 DIAGNOSIS — E119 Type 2 diabetes mellitus without complications: Secondary | ICD-10-CM | POA: Diagnosis not present

## 2020-12-18 DIAGNOSIS — N179 Acute kidney failure, unspecified: Secondary | ICD-10-CM | POA: Diagnosis not present

## 2020-12-18 DIAGNOSIS — J9601 Acute respiratory failure with hypoxia: Secondary | ICD-10-CM | POA: Diagnosis not present

## 2020-12-19 ENCOUNTER — Ambulatory Visit: Payer: Self-pay

## 2020-12-20 DIAGNOSIS — J181 Lobar pneumonia, unspecified organism: Secondary | ICD-10-CM | POA: Diagnosis not present

## 2020-12-20 DIAGNOSIS — I700 Atherosclerosis of aorta: Secondary | ICD-10-CM | POA: Diagnosis not present

## 2020-12-20 DIAGNOSIS — J9601 Acute respiratory failure with hypoxia: Secondary | ICD-10-CM | POA: Diagnosis not present

## 2020-12-20 DIAGNOSIS — I110 Hypertensive heart disease with heart failure: Secondary | ICD-10-CM | POA: Diagnosis not present

## 2020-12-20 DIAGNOSIS — E119 Type 2 diabetes mellitus without complications: Secondary | ICD-10-CM | POA: Diagnosis not present

## 2020-12-20 DIAGNOSIS — I5032 Chronic diastolic (congestive) heart failure: Secondary | ICD-10-CM | POA: Diagnosis not present

## 2020-12-20 DIAGNOSIS — N179 Acute kidney failure, unspecified: Secondary | ICD-10-CM | POA: Diagnosis not present

## 2020-12-20 DIAGNOSIS — I2510 Atherosclerotic heart disease of native coronary artery without angina pectoris: Secondary | ICD-10-CM | POA: Diagnosis not present

## 2020-12-20 DIAGNOSIS — J432 Centrilobular emphysema: Secondary | ICD-10-CM | POA: Diagnosis not present

## 2020-12-21 DIAGNOSIS — I110 Hypertensive heart disease with heart failure: Secondary | ICD-10-CM | POA: Diagnosis not present

## 2020-12-21 DIAGNOSIS — E119 Type 2 diabetes mellitus without complications: Secondary | ICD-10-CM | POA: Diagnosis not present

## 2020-12-21 DIAGNOSIS — J432 Centrilobular emphysema: Secondary | ICD-10-CM | POA: Diagnosis not present

## 2020-12-21 DIAGNOSIS — I700 Atherosclerosis of aorta: Secondary | ICD-10-CM | POA: Diagnosis not present

## 2020-12-21 DIAGNOSIS — N179 Acute kidney failure, unspecified: Secondary | ICD-10-CM | POA: Diagnosis not present

## 2020-12-21 DIAGNOSIS — J9601 Acute respiratory failure with hypoxia: Secondary | ICD-10-CM | POA: Diagnosis not present

## 2020-12-21 DIAGNOSIS — J181 Lobar pneumonia, unspecified organism: Secondary | ICD-10-CM | POA: Diagnosis not present

## 2020-12-21 DIAGNOSIS — I2510 Atherosclerotic heart disease of native coronary artery without angina pectoris: Secondary | ICD-10-CM | POA: Diagnosis not present

## 2020-12-21 DIAGNOSIS — I5032 Chronic diastolic (congestive) heart failure: Secondary | ICD-10-CM | POA: Diagnosis not present

## 2020-12-26 DIAGNOSIS — I110 Hypertensive heart disease with heart failure: Secondary | ICD-10-CM | POA: Diagnosis not present

## 2020-12-26 DIAGNOSIS — I2510 Atherosclerotic heart disease of native coronary artery without angina pectoris: Secondary | ICD-10-CM | POA: Diagnosis not present

## 2020-12-26 DIAGNOSIS — J9601 Acute respiratory failure with hypoxia: Secondary | ICD-10-CM | POA: Diagnosis not present

## 2020-12-26 DIAGNOSIS — N179 Acute kidney failure, unspecified: Secondary | ICD-10-CM | POA: Diagnosis not present

## 2020-12-26 DIAGNOSIS — I700 Atherosclerosis of aorta: Secondary | ICD-10-CM | POA: Diagnosis not present

## 2020-12-26 DIAGNOSIS — I5032 Chronic diastolic (congestive) heart failure: Secondary | ICD-10-CM | POA: Diagnosis not present

## 2020-12-26 DIAGNOSIS — J432 Centrilobular emphysema: Secondary | ICD-10-CM | POA: Diagnosis not present

## 2020-12-26 DIAGNOSIS — E119 Type 2 diabetes mellitus without complications: Secondary | ICD-10-CM | POA: Diagnosis not present

## 2020-12-26 DIAGNOSIS — J181 Lobar pneumonia, unspecified organism: Secondary | ICD-10-CM | POA: Diagnosis not present

## 2020-12-28 ENCOUNTER — Other Ambulatory Visit: Payer: Self-pay

## 2020-12-28 ENCOUNTER — Ambulatory Visit (INDEPENDENT_AMBULATORY_CARE_PROVIDER_SITE_OTHER): Payer: Medicare HMO

## 2020-12-28 DIAGNOSIS — J4540 Moderate persistent asthma, uncomplicated: Secondary | ICD-10-CM | POA: Diagnosis not present

## 2020-12-31 DIAGNOSIS — J181 Lobar pneumonia, unspecified organism: Secondary | ICD-10-CM | POA: Diagnosis not present

## 2020-12-31 DIAGNOSIS — J432 Centrilobular emphysema: Secondary | ICD-10-CM | POA: Diagnosis not present

## 2020-12-31 DIAGNOSIS — J9601 Acute respiratory failure with hypoxia: Secondary | ICD-10-CM | POA: Diagnosis not present

## 2020-12-31 DIAGNOSIS — I110 Hypertensive heart disease with heart failure: Secondary | ICD-10-CM | POA: Diagnosis not present

## 2021-01-01 DIAGNOSIS — I700 Atherosclerosis of aorta: Secondary | ICD-10-CM | POA: Diagnosis not present

## 2021-01-01 DIAGNOSIS — J432 Centrilobular emphysema: Secondary | ICD-10-CM | POA: Diagnosis not present

## 2021-01-01 DIAGNOSIS — J9601 Acute respiratory failure with hypoxia: Secondary | ICD-10-CM | POA: Diagnosis not present

## 2021-01-01 DIAGNOSIS — J181 Lobar pneumonia, unspecified organism: Secondary | ICD-10-CM | POA: Diagnosis not present

## 2021-01-01 DIAGNOSIS — I2510 Atherosclerotic heart disease of native coronary artery without angina pectoris: Secondary | ICD-10-CM | POA: Diagnosis not present

## 2021-01-01 DIAGNOSIS — E119 Type 2 diabetes mellitus without complications: Secondary | ICD-10-CM | POA: Diagnosis not present

## 2021-01-01 DIAGNOSIS — N179 Acute kidney failure, unspecified: Secondary | ICD-10-CM | POA: Diagnosis not present

## 2021-01-01 DIAGNOSIS — I110 Hypertensive heart disease with heart failure: Secondary | ICD-10-CM | POA: Diagnosis not present

## 2021-01-01 DIAGNOSIS — I5032 Chronic diastolic (congestive) heart failure: Secondary | ICD-10-CM | POA: Diagnosis not present

## 2021-01-02 DIAGNOSIS — J432 Centrilobular emphysema: Secondary | ICD-10-CM | POA: Diagnosis not present

## 2021-01-02 DIAGNOSIS — E119 Type 2 diabetes mellitus without complications: Secondary | ICD-10-CM | POA: Diagnosis not present

## 2021-01-02 DIAGNOSIS — I700 Atherosclerosis of aorta: Secondary | ICD-10-CM | POA: Diagnosis not present

## 2021-01-02 DIAGNOSIS — I110 Hypertensive heart disease with heart failure: Secondary | ICD-10-CM | POA: Diagnosis not present

## 2021-01-02 DIAGNOSIS — N179 Acute kidney failure, unspecified: Secondary | ICD-10-CM | POA: Diagnosis not present

## 2021-01-02 DIAGNOSIS — J181 Lobar pneumonia, unspecified organism: Secondary | ICD-10-CM | POA: Diagnosis not present

## 2021-01-02 DIAGNOSIS — I2510 Atherosclerotic heart disease of native coronary artery without angina pectoris: Secondary | ICD-10-CM | POA: Diagnosis not present

## 2021-01-02 DIAGNOSIS — I5032 Chronic diastolic (congestive) heart failure: Secondary | ICD-10-CM | POA: Diagnosis not present

## 2021-01-02 DIAGNOSIS — J9601 Acute respiratory failure with hypoxia: Secondary | ICD-10-CM | POA: Diagnosis not present

## 2021-01-04 ENCOUNTER — Other Ambulatory Visit: Payer: Self-pay

## 2021-01-04 ENCOUNTER — Ambulatory Visit: Payer: Medicare HMO | Admitting: Internal Medicine

## 2021-01-04 ENCOUNTER — Encounter: Payer: Self-pay | Admitting: Internal Medicine

## 2021-01-04 DIAGNOSIS — Z9989 Dependence on other enabling machines and devices: Secondary | ICD-10-CM | POA: Diagnosis not present

## 2021-01-04 DIAGNOSIS — J449 Chronic obstructive pulmonary disease, unspecified: Secondary | ICD-10-CM | POA: Diagnosis not present

## 2021-01-04 DIAGNOSIS — G4733 Obstructive sleep apnea (adult) (pediatric): Secondary | ICD-10-CM

## 2021-01-04 DIAGNOSIS — F17210 Nicotine dependence, cigarettes, uncomplicated: Secondary | ICD-10-CM | POA: Diagnosis not present

## 2021-01-04 NOTE — Assessment & Plan Note (Addendum)
Active smoker  - PFT's  04/07/18 FEV1 2.25 (75 % ) ratio 0.52  p 20 % improvement from saba p ? prior to study with DLCO  15.40 (47%) corrects to 2.38 (51%)  for alv volume and FV curve classically concave   - 01/04/2021  After extensive coaching inhaler device,  effectiveness =  90% Active smoker  - PFT's  04/07/18 FEV1 2.25 (75 % ) ratio 0.52  p 20 % improvement from saba p ? prior to study with DLCO  15.40 (47%) corrects to 2.38 (51%)  for alv volume and FV curve classically concave   - 01/04/2021  After extensive coaching inhaler device,  effectiveness =  90%  -  01/04/2021   Walked RA  3 laps @ approx 242ft each @ moderate pace  stopped due to end of study, sats 89% at end, no sob    Group D in terms of symptom/risk and laba/lama/ICS  therefore appropriate rx at this point >>>  breztri best choice but I have no samples and he's not convinced he's really slowed down by breathing or helped by various medications to date which he has not been consistently taking   Re saba: I spent extra time with pt today reviewing appropriate use of albuterol for prn use on exertion with the following points: 1) saba is for relief of sob that does not improve by walking a slower pace or resting but rather if the pt does not improve after trying this first. 2) If the pt is convinced, as many are, that saba helps recover from activity faster then it's easy to tell if this is the case by re-challenging : ie stop, take the inhaler, then p 5 minutes try the exact same activity (intensity of workload) that just caused the symptoms and see if they are substantially diminished or not after saba 3) if there is an activity that reproducibly causes the symptoms, try the saba 15 min before the activity on alternate days   If in fact the saba really does help, then fine to continue to use it prn but advised may need to look closer at the maintenance regimen(eg start breztri 2 bid)  being used to achieve better control of airways  disease with exertion.    >>> call back if feels saba in either hfa or neb form is improving ex tol or having AB flares not controlled with Xolair or alternative biologic (defer to Dr Ernst Bowler)

## 2021-01-04 NOTE — Progress Notes (Signed)
Kevin Frank, male    DOB: Jul 09, 1940   MRN: [id number]   Brief patient profile:  27 yowm active  smoker referred to pulmonary clinic in [place]  01/04/2021 by Dr  Donne Hazel re copd/acos with PFTS 04/07/18 c/w GOLD II with reverisbility and low dlco c/w emphysematous component prior to stereotactic RT to stage 1 RUL sq cell ca.   Admit date: 12/10/2020 Discharge date: 12/12/2020     Recommendations for Outpatient Follow-up:  Follow up with PCP in 1 weeks Ambulatory referral to pulmonology   Brief Hospitalization Summary: Please see all hospital notes, images, labs for full details of the hospitalization. ADMISSION HPI:   Kevin Frank is a 80 y.o. male with medical history significant of AAA, COPD/Asthma, Gout, DM, HTN, HLD, nephrolithiasis, Stage 1 SCC  RUL lung. Pt presenting w/ 3-4 day h/o cough and progressive SOB.  Went to [facility] today at [employer] of family and noted to have O2 in the 80s. Given Solumedrol and Duonebs enroute to ED w/ improvement. Remains on O2 in order to maintain saturations above 90%. Associated w/ sugjective fevers and chills.     HOSPITAL COURSE BY PROBLEM   Acute respiratory failure with hypoxia -Secondary to pneumonia and COPD exacerbation -Currently stable on 3 L nasal cannula -Wean oxygen as tolerated for saturation greater 90% -Personally reviewed chest x-ray-increased interstitial markings worst LUL PT WILL DISCHARGE HOME ON 3L/MIN OXYGEN.    COPD exacerbation - IMPROVED -treated with Pulmicort and duonebs -treated with IV Solu-Medrol - DC home on prednisone taper   Lobar pneumonia -CT chest:  IMPRESSION: 1. The appearance of the lungs is compatible with multilobar bilateral bronchopneumonia, most involved in the left upper lobe. 2. Mild diffuse bronchial wall thickening with mild to moderate centrilobular and paraseptal emphysema; imaging findings suggestive of underlying COPD. 3. Aortic atherosclerosis, in addition to left  main and 3 vessel coronary artery disease. Assessment for potential risk factor modification, dietary therapy or pharmacologic therapy may be warranted, if clinically indicated. 4. There are calcifications of the aortic valve. Echocardiographic correlation for evaluation of potential valvular dysfunction may be warranted if clinically indicated. -treated with ceftriaxone azithromycin and discharge home on oral doxycycline     Tobacco abuse -Tobacco cessation discussed   Discharge Diagnoses:  Active Problems:   HYPERCHOLESTEROLEMIA   Essential hypertension, benign   AAA (abdominal aortic aneurysm) without rupture (HCC)   AKI (acute kidney injury) (HCC)   Chronic diastolic CHF (congestive heart failure) (HCC)   Asthma-COPD overlap syndrome (HCC)   Squamous cell carcinoma of right lung (HCC)   Diabetes mellitus without complication ([place])   Acute respiratory failure (HCC)   Lobar pneumonia ([place])  History of Present Illness  01/04/2021  Pulmonary/ 1st office eval/ Mekisha Bittel / [place] [facility] re ACOS / on xolair per DR Ernst Bowler Chief Complaint  Patient presents with   Pulmonary Consult    Referred by Dr Wynetta Emery for eval of Asthma-COPD overlap syndrome.   Dyspnea:  [employer] walking though I gather he is very sedentary  Cough: in am / improved p coffee  Sleep: cpap x 20 years  SABA use: variable, not sure it helps / never prechallenges  0 2  2lpm hs x since last admit  Supposed to be taking budesonide from Dr Ernst Bowler but doesn't use it.   No obvious patterns in doe interms of either day to day or daytime variability or assoc excess/ purulent sputum or mucus plugs or hemoptysis or cp or chest tightness, subjective wheeze  or overt sinus or hb symptoms.   sleeping on cpap/ 02 without nocturnal  or early am exacerbation  of respiratory  c/o's or need for noct saba. Also denies any obvious fluctuation of symptoms with weather or environmental changes or other aggravating or alleviating  factors except as outlined above   No unusual exposure hx or h/o childhood pna/ asthma or knowledge of premature birth.  Current Allergies, Complete Past Medical History, Past Surgical History, Family History, and Social History were reviewed in [employer] record.  ROS  The following are not active complaints unless bolded Hoarseness, sore throat, dysphagia, dental problems, itching, sneezing,  nasal congestion or discharge of excess mucus or purulent secretions, ear ache,   fever, chills, sweats, unintended wt loss or wt gain, classically pleuritic or exertional cp,  orthopnea pnd or arm/hand swelling  or leg swelling, presyncope, palpitations, abdominal pain, anorexia, nausea, vomiting, diarrhea  or change in bowel habits or change in bladder habits, change in stools or change in urine, dysuria, hematuria,  rash, arthralgias, visual complaints, headache, numbness, weakness or ataxia or problems with walking or coordination,  change in mood or  memory.           Past Medical History:  Diagnosis Date   AAA (abdominal aortic aneurysm) ([place]) 01/2008   3.2cm   Allergy    Arthritis    Asthma    BPH (benign prostatic hyperplasia)    Chronic knee pain    bilateral   COPD (chronic obstructive pulmonary disease) ([place])    Diabetes mellitus    type II   Gout    History of kidney stones    Hypercholesterolemia    Hyperlipidemia    Hypertension    Lung cancer ([place]) 2019   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    Normal nuclear stress test 2011   Obesity    Obstructive sleep apnea on CPAP    Presence of indwelling urinary catheter    Shortness of breath    due to cigarette abuse   Tobacco abuse     Outpatient Medications Prior to Visit  Medication Sig Dispense Refill   albuterol (PROVENTIL) (2.5 MG/3ML) 0.083% nebulizer solution Take 2.5 mg by nebulization every 6 (six) hours as needed for wheezing or shortness of breath.     albuterol (VENTOLIN  HFA) 108 (90 Base) MCG/ACT inhaler Use 4 puffs every 4-6 hours as needed for cough or wheeze 18 g 2   allopurinol (ZYLOPRIM) 300 MG tablet Take 1 tablet (300 mg total) by mouth daily. 30 tablet 1   amLODipine (NORVASC) 10 MG tablet Take 1 tablet (10 mg total) by mouth daily. 14 tablet 0   atorvastatin (LIPITOR) 20 MG tablet Take 1 tablet (20 mg total) by mouth daily. 90 tablet 1   clopidogrel (PLAVIX) 75 MG tablet Take 1 tablet (75 mg total) by mouth daily. 30 tablet 1   hydrALAZINE (APRESOLINE) 50 MG tablet Take 1 tablet (50 mg total) by mouth in the morning and at bedtime.     losartan (COZAAR) 100 MG tablet TAKE 1 TABLET BY MOUTH ONCE DAILY. (Patient taking differently: Take 100 mg by mouth daily.) 30 tablet 0   metFORMIN (GLUCOPHAGE) 500 MG tablet Take 1,000 mg by mouth 2 (two) times daily with a meal.      metoprolol succinate (TOPROL-XL) 25 MG 24 hr tablet Take 1 tablet (25 mg total) by mouth daily. 14 tablet 0   sertraline (ZOLOFT) 50  MG tablet Take 1 tablet (50 mg total) by mouth daily. 30 tablet 1   torsemide (DEMADEX) 20 MG tablet Take 1 tablet (20 mg total) by mouth daily.     Budeson-Glycopyrrol-Formoterol (BREZTRI AEROSPHERE) 160-9-4.8 MCG/ACT AERO Inhale 2 puffs into the lungs 2 (two) times daily. 10.7 g 1   ipratropium-albuterol (DUONEB) 0.5-2.5 (3) MG/3ML SOLN Take 3 mLs by nebulization every 6 (six) hours. 360 mL 2   predniSONE (DELTASONE) 20 MG tablet Take 3 PO QAM x3days, 2 PO QAM x3days, 1 PO QAM x3days 18 tablet 0   Facility-Administered Medications Prior to Visit  Medication Dose Route Frequency Provider Last Rate Last Admin   levalbuterol (XOPENEX) nebulizer solution 1.25 mg  1.25 mg Nebulization Once Gean Quint, MD       omalizumab Arvid Right) injection 375 mg  375 mg Subcutaneous Q14 Days Valentina Shaggy, MD   375 mg at 12/28/20 7616     Objective:     BP 122/66 (BP Location: Left Arm, Cuff Size: Normal)   Pulse 74   Temp 98.8 F (37.1 C) (Oral)   Ht 5'  10" (1.778 m)   Wt 219 lb (99.3 kg)   SpO2 95% Comment: on RA  BMI 31.42 kg/m   SpO2: 95 % (on RA)  Mod obese amb wm extremely hard of hearing and wife answers questions about med but doesn't understand maint vs prn or inhaler vs nebulizer  .    HEENT : pt wearing mask not removed for exam due to covid - 19 concerns.    NECK :  without JVD/Nodes/TM/ nl carotid upstrokes bilaterally   LUNGS: no acc muscle use,  Mild barrel  contour chest wall with bilateral  expiratory rhonchi         and  without cough on insp or exp maneuvers  and mild  Hyperresonant  to  percussion bilaterally     CV:  RRR  no s3 or murmur or increase in P2, and no edema   ABD:  soft and nontender with pos end  insp Hoover's  in the supine position. No bruits or organomegaly appreciated, bowel sounds nl  MS:   Nl gait/  ext warm without deformities, calf tenderness, cyanosis or clubbing No obvious joint restrictions   SKIN: warm and dry without lesions    NEURO:  alert, approp, nl sensorium with  no motor or cerebellar deficits apparent.      CXR PA and Lateral:   01/04/2021 :    I personally reviewed images and agree with radiology impression as follows:    Refused cxr  (ordered to f/u the abn ct chest 12/11/20)    Assessment   Asthma-COPD overlap syndrome ([place]) Active smoker  - PFT's  04/07/18 FEV1 2.25 (75 % ) ratio 0.52  p 20 % improvement from saba p ? prior to study with DLCO  15.40 (47%) corrects to 2.38 (51%)  for alv volume and FV curve classically concave   - 01/04/2021  After extensive coaching inhaler device,  effectiveness =  90% Active smoker  - PFT's  04/07/18 FEV1 2.25 (75 % ) ratio 0.52  p 20 % improvement from saba p ? prior to study with DLCO  15.40 (47%) corrects to 2.38 (51%)  for alv volume and FV curve classically concave   - 01/04/2021  After extensive coaching inhaler device,  effectiveness =  90%  -  01/04/2021   Walked RA  3 laps @ approx 275ft each @ moderate  pace  stopped due to  end of study, sats 89% at end, no sob    Group D in terms of symptom/risk and laba/lama/ICS  therefore appropriate rx at this point >>>  breztri best choice but I have no samples and he's not convinced he's really slowed down by breathing or helped by various medications to date which he has not been consistently taking   Re saba: I spent extra time with pt today reviewing appropriate use of albuterol for prn use on exertion with the following points: 1) saba is for relief of sob that does not improve by walking a slower pace or resting but rather if the pt does not improve after trying this first. 2) If the pt is convinced, as many are, that saba helps recover from activity faster then it's easy to tell if this is the case by re-challenging : ie stop, take the inhaler, then p 5 minutes try the exact same activity (intensity of workload) that just caused the symptoms and see if they are substantially diminished or not after saba 3) if there is an activity that reproducibly causes the symptoms, try the saba 15 min before the activity on alternate days   If in fact the saba really does help, then fine to continue to use it prn but advised may need to look closer at the maintenance regimen(eg start breztri 2 bid)  being used to achieve better control of airways disease with exertion.    >>> call back if feels saba in either hfa or neb form is improving ex tol or having AB flares not controlled with Xolair or alternative biologic (defer to Dr Ernst Bowler)   Obstructive sleep apnea on CPAP Needs sleep medicine evaluation next > referred    Cigarette smoker Counseled re importance of smoking cessation but did not meet time criteria for separate billing     Each maintenance medication was reviewed in detail including emphasizing most importantly the difference between maintenance and prns and under what circumstances the prns are to be triggered using an action plan format where appropriate.  Total  time for H and P, chart review, counseling, reviewing hfa device(s) , directly observing portions of ambulatory 02 saturation study/ and generating customized AVS unique to this office visit / same day charting = 60                  Christinia Gully, MD 01/04/2021

## 2021-01-04 NOTE — Patient Instructions (Addendum)
Ok if you can identify something you want to do but it makes you short of breath then take albuterol inhaler x 2 puffs and then the next time nebulizer albuterol 2.5 mg x 15 min before you  start and see if the next several hours are easier and if so then it would make sense to start a maintenance medication called breztri but you can't take it as needed, should be taken 2 puffs every 12 hours.  We will set him with sleep medicine here next available - bring the cpap machine with you   Please remember to go to the  x-ray department  @  [facility] for your tests - we will call you with the results when they are available      The key is to stop smoking completely before smoking completely stops you!

## 2021-01-04 NOTE — Assessment & Plan Note (Signed)
Needs sleep medicine evaluation next > referred

## 2021-01-05 ENCOUNTER — Encounter: Payer: Self-pay | Admitting: Internal Medicine

## 2021-01-05 NOTE — Assessment & Plan Note (Signed)
Counseled re importance of smoking cessation but did not meet time criteria for separate billing     Each maintenance medication was reviewed in detail including emphasizing most importantly the difference between maintenance and prns and under what circumstances the prns are to be triggered using an action plan format where appropriate.  Total time for H and P, chart review, counseling, reviewing hfa device(s) , directly observing portions of ambulatory 02 saturation study/ and generating customized AVS unique to this office visit / same day charting = 60

## 2021-01-08 DIAGNOSIS — N179 Acute kidney failure, unspecified: Secondary | ICD-10-CM | POA: Diagnosis not present

## 2021-01-08 DIAGNOSIS — J432 Centrilobular emphysema: Secondary | ICD-10-CM | POA: Diagnosis not present

## 2021-01-08 DIAGNOSIS — J181 Lobar pneumonia, unspecified organism: Secondary | ICD-10-CM | POA: Diagnosis not present

## 2021-01-08 DIAGNOSIS — I2510 Atherosclerotic heart disease of native coronary artery without angina pectoris: Secondary | ICD-10-CM | POA: Diagnosis not present

## 2021-01-08 DIAGNOSIS — I5032 Chronic diastolic (congestive) heart failure: Secondary | ICD-10-CM | POA: Diagnosis not present

## 2021-01-08 DIAGNOSIS — I700 Atherosclerosis of aorta: Secondary | ICD-10-CM | POA: Diagnosis not present

## 2021-01-08 DIAGNOSIS — J9601 Acute respiratory failure with hypoxia: Secondary | ICD-10-CM | POA: Diagnosis not present

## 2021-01-08 DIAGNOSIS — E119 Type 2 diabetes mellitus without complications: Secondary | ICD-10-CM | POA: Diagnosis not present

## 2021-01-08 DIAGNOSIS — I110 Hypertensive heart disease with heart failure: Secondary | ICD-10-CM | POA: Diagnosis not present

## 2021-01-09 DIAGNOSIS — E119 Type 2 diabetes mellitus without complications: Secondary | ICD-10-CM | POA: Diagnosis not present

## 2021-01-09 DIAGNOSIS — I5032 Chronic diastolic (congestive) heart failure: Secondary | ICD-10-CM | POA: Diagnosis not present

## 2021-01-09 DIAGNOSIS — I110 Hypertensive heart disease with heart failure: Secondary | ICD-10-CM | POA: Diagnosis not present

## 2021-01-09 DIAGNOSIS — I2510 Atherosclerotic heart disease of native coronary artery without angina pectoris: Secondary | ICD-10-CM | POA: Diagnosis not present

## 2021-01-09 DIAGNOSIS — J181 Lobar pneumonia, unspecified organism: Secondary | ICD-10-CM | POA: Diagnosis not present

## 2021-01-09 DIAGNOSIS — J432 Centrilobular emphysema: Secondary | ICD-10-CM | POA: Diagnosis not present

## 2021-01-09 DIAGNOSIS — J9601 Acute respiratory failure with hypoxia: Secondary | ICD-10-CM | POA: Diagnosis not present

## 2021-01-09 DIAGNOSIS — N179 Acute kidney failure, unspecified: Secondary | ICD-10-CM | POA: Diagnosis not present

## 2021-01-09 DIAGNOSIS — I700 Atherosclerosis of aorta: Secondary | ICD-10-CM | POA: Diagnosis not present

## 2021-01-11 ENCOUNTER — Other Ambulatory Visit: Payer: Self-pay

## 2021-01-11 ENCOUNTER — Ambulatory Visit (INDEPENDENT_AMBULATORY_CARE_PROVIDER_SITE_OTHER): Payer: Medicare HMO

## 2021-01-11 DIAGNOSIS — J4540 Moderate persistent asthma, uncomplicated: Secondary | ICD-10-CM

## 2021-01-11 DIAGNOSIS — J449 Chronic obstructive pulmonary disease, unspecified: Secondary | ICD-10-CM | POA: Diagnosis not present

## 2021-01-22 ENCOUNTER — Telehealth: Payer: Self-pay

## 2021-01-22 DIAGNOSIS — R0602 Shortness of breath: Secondary | ICD-10-CM

## 2021-01-22 NOTE — Telephone Encounter (Signed)
Dr. Ernst Bowler informed me that patient is needing a chest x-ray for Shortness of breath. Chest x-ray has been ordered for [facility].

## 2021-01-23 NOTE — Telephone Encounter (Signed)
I have been communicating with Hussein's daughter.  She reported that he is having a lot of sleepiness.  He wakes up to eat and then go straight back to bed.  This is not like him at all.  His O2 has been stable in the low 90s.  He was recently started on Januvia diabetes, which his daughter thinks might be related to his symptoms.  Evidently, symptoms worsened after he was weaned from his prednisone from his recent hospitalization.  It does not seem that he has been febrile.  Of note, he does have an appointment with Dr. Halford Chessman on August 23rd.  His daughter is requesting more prednisone, but I do want to get a chest x-ray before we do that.  This was ordered and is pending.  He might just need a longer prednisone wean.  He remains on his Xolair, which has done a fairly good job of controlling his exacerbations for his asthma/COPD overlap.  Salvatore Marvel, MD [facility] [place]

## 2021-01-25 ENCOUNTER — Other Ambulatory Visit: Payer: Self-pay

## 2021-01-25 ENCOUNTER — Ambulatory Visit (INDEPENDENT_AMBULATORY_CARE_PROVIDER_SITE_OTHER): Payer: Medicare HMO

## 2021-01-25 DIAGNOSIS — J4540 Moderate persistent asthma, uncomplicated: Secondary | ICD-10-CM | POA: Diagnosis not present

## 2021-01-29 ENCOUNTER — Other Ambulatory Visit: Payer: Self-pay

## 2021-01-29 ENCOUNTER — Ambulatory Visit (HOSPITAL_COMMUNITY)
Admission: RE | Admit: 2021-01-29 | Discharge: 2021-01-29 | Disposition: A | Discharge status: Home / Self Care | Payer: Medicare HMO | Source: Ambulatory Visit | Attending: Family Medicine | Admitting: Family Medicine

## 2021-01-29 ENCOUNTER — Other Ambulatory Visit (HOSPITAL_COMMUNITY): Payer: Self-pay | Admitting: Family Medicine

## 2021-01-29 DIAGNOSIS — Z0001 Encounter for general adult medical examination with abnormal findings: Secondary | ICD-10-CM | POA: Diagnosis not present

## 2021-01-29 DIAGNOSIS — E118 Type 2 diabetes mellitus with unspecified complications: Secondary | ICD-10-CM | POA: Diagnosis not present

## 2021-01-29 DIAGNOSIS — Z1389 Encounter for screening for other disorder: Secondary | ICD-10-CM | POA: Diagnosis not present

## 2021-01-29 DIAGNOSIS — C3491 Malignant neoplasm of unspecified part of right bronchus or lung: Secondary | ICD-10-CM | POA: Diagnosis not present

## 2021-01-29 DIAGNOSIS — I2510 Atherosclerotic heart disease of native coronary artery without angina pectoris: Secondary | ICD-10-CM | POA: Diagnosis not present

## 2021-01-29 DIAGNOSIS — E559 Vitamin D deficiency, unspecified: Secondary | ICD-10-CM | POA: Diagnosis not present

## 2021-01-29 DIAGNOSIS — Z6831 Body mass index (BMI) 31.0-31.9, adult: Secondary | ICD-10-CM | POA: Diagnosis not present

## 2021-01-29 DIAGNOSIS — M1991 Primary osteoarthritis, unspecified site: Secondary | ICD-10-CM | POA: Diagnosis not present

## 2021-01-29 DIAGNOSIS — I10 Essential (primary) hypertension: Secondary | ICD-10-CM | POA: Diagnosis not present

## 2021-01-29 DIAGNOSIS — E538 Deficiency of other specified B group vitamins: Secondary | ICD-10-CM | POA: Diagnosis not present

## 2021-01-29 DIAGNOSIS — I700 Atherosclerosis of aorta: Secondary | ICD-10-CM | POA: Diagnosis not present

## 2021-01-29 DIAGNOSIS — J449 Chronic obstructive pulmonary disease, unspecified: Secondary | ICD-10-CM | POA: Diagnosis not present

## 2021-01-30 ENCOUNTER — Encounter: Payer: Self-pay | Admitting: Allergy & Immunology

## 2021-01-30 ENCOUNTER — Ambulatory Visit: Payer: Medicare HMO | Admitting: Allergy & Immunology

## 2021-01-30 VITALS — BP 142/88 | HR 85 | Resp 19

## 2021-01-30 DIAGNOSIS — F17200 Nicotine dependence, unspecified, uncomplicated: Secondary | ICD-10-CM

## 2021-01-30 DIAGNOSIS — J3089 Other allergic rhinitis: Secondary | ICD-10-CM

## 2021-01-30 DIAGNOSIS — J449 Chronic obstructive pulmonary disease, unspecified: Secondary | ICD-10-CM | POA: Diagnosis not present

## 2021-01-30 DIAGNOSIS — J302 Other seasonal allergic rhinitis: Secondary | ICD-10-CM

## 2021-01-30 MED ORDER — PREDNISONE 10 MG PO TABS: ORAL_TABLET | ORAL | 0 refills | Status: DC

## 2021-01-30 NOTE — Progress Notes (Signed)
FOLLOW UP  Date of Service/Encounter:  01/30/21   Assessment:   Asthma-COPD overlap syndrome - improved on Xolair every 14 days but now with acute exacerbation   Seasonal and perennial allergic rhinitis (cat, dog, cockroach, trees, weeds, and ragweed)   Current smoker   Complex medical history, including a recently diagnosed lung cancer (detected early, treated with radiation and resection alone)   Restrictive pattern of spirometry - slightly better with the [facility], although this might be his new baseline  Plan/Recommendations:    1. Asthma/COPD overlap - Lung testing has decreased since the last time.  - You need to RESTART the Breztri two puffs twice daily in addition to the oxygen at night. - Start the prednisone pack provided: 2 tabs twice daily for one week, 1 tab twice daily for one week, 1 tab daily for one week, then STOP!  - Continue Xolair injections every 2 weeks - May use albuterol 2 puffs every 4 hours as needed for cough, wheeze, tightness in chest, shortness of breath OR may use albuterol via nebulizer 1 ampoule every 4 hours as needed for cough, wheeze, tightness in chest, or shortness of breath. - When you come in for your next Xolair injection finger COVID-19 vaccine card so we can figure out if it is time to receive your booster Asthma control goals:  Full participation in all desired activities (may need albuterol before activity) Albuterol use two time or less a week on average (not counting use with activity) Cough interfering with sleep two time or less a month Oral steroids no more than once a year No hospitalizations  2. Seasonal and perennial allergic rhinitis - May use an antihistamine such as Claritin, Allegra, Xyzal, or Zyrtec once a day as needed for runny nose  3. Follow up in one month.    Subjective:   Kevin Frank is a 80 y.o. male presenting today for follow up of  Chief Complaint  Patient presents with   Asthma    Kevin Frank has a history of the following: Patient Active Problem List   Diagnosis Date Noted   Lobar pneumonia ([place]) 12/11/2020   Acute respiratory failure ([place]) 12/10/2020   Wrist pain, acute, right 09/07/2019   Right forearm pain 09/07/2019   Uncontrolled type 2 diabetes mellitus with hyperglycemia ([place]) 06/27/2019   Mixed hyperlipidemia 06/27/2019   Class 1 obesity due to excess calories with serious comorbidity and body mass index (BMI) of 32.0 to 32.9 in adult 06/27/2019   Diabetes mellitus without complication ([place]) 29/52/8413   Positive colorectal cancer screening using Cologuard test 06/14/2019   Squamous cell carcinoma of right lung ([place]) 03/29/2018   Seasonal and perennial allergic rhinitis 09/22/2017   Asthma-COPD overlap syndrome ([place]) 09/22/2017   Obstructive sleep apnea on CPAP 04/23/2017   Chronic knee pain 04/23/2017   AKI (acute kidney injury) ([place]) 04/23/2017   Chronic diastolic CHF (congestive heart failure) ([place]) 04/23/2017   Cellulitis 04/23/2017   Cellulitis of left lower leg    Cigarette smoker 03/05/2016   Incidental lung nodule, > 20mm and < 28mm 03/05/2016   Stage 2 moderate COPD by GOLD classification ([place]) 03/05/2016   AAA (abdominal aortic aneurysm) without rupture ([place]) 06/14/2015   OBSTRUCTIVE SLEEP APNEA 06/09/2007   HYPERCHOLESTEROLEMIA 05/06/2007   GOUT 05/06/2007   DEPRESSION 05/06/2007   Essential hypertension, benign 05/06/2007   ABDOMINAL AORTIC ANEURYSM 05/06/2007   BENIGN PROSTATIC HYPERTROPHY, HX OF 05/06/2007    History obtained from: chart review and  patient.  Kevin Frank is a 80 y.o. male presenting for a follow up visit. He was last seen in May 2022. At that time, lung testing appeared stable. We continued on Breztri two puffs twice daily as well as Xolair injections every two weeks. For his rhinitis, we continued with an OTC antihistamine daily as needed. He had a lesion on the right arm which looked rather gnarly; we started doxycycline 100mg   twice daily for two weeks. We also added on mupirocin twice daily for a couple of weeks.   Since the last visit, he has had a rough course. He was recently admitted to the hospital for pneumonia back in June 2022. Despite that, he remains angry about our not coming to his [place] party. His wife called [place] to tell us that the party was not happening.   Asthma/Respiratory Symptom History: He was discharged from the hospital on oxygen. He uses this at night only. He feels that his energy has improved since discharged from the hospital. He was treated with ceftriaxone azithromycin and discharged home on oral doxycycline. However, his daughter contacted me to voice concerns with her decreased energy. I recommended doing a longer prednisone burst, but I preferred to get a CXR first. He was also scheduled to see Dr. Melvyn Novas with Pulmonology, whom he saw on 01/04/21.   With clarification, he reports that he thought the the oxygen replaced the [facility] and that he did not need to use it. We clarified that this was not the case and that he needed to stay on both the [facility] and the oxygen.  Allergic Rhinitis Symptom History: Allergic rhinitis is controlled with the PRN use of OTC antihistamines. He has not been using any kind of nose spray at all.   Otherwise, there have been no changes to his past medical history, surgical history, family history, or social history.    Review of Systems  Constitutional:  Positive for malaise/fatigue. Negative for chills, fever and weight loss.  HENT: Negative.  Negative for congestion, ear discharge, ear pain and sinus pain.   Eyes:  Negative for pain, discharge and redness.  Respiratory:  Positive for cough, shortness of breath and wheezing. Negative for sputum production.   Cardiovascular: Negative.  Negative for chest pain and palpitations.  Gastrointestinal:  Negative for abdominal pain, heartburn, nausea and vomiting.  Skin: Negative.  Negative for itching and rash.   Neurological:  Negative for dizziness and headaches.  Endo/Heme/Allergies:  Negative for environmental allergies. Does not bruise/bleed easily.  All other systems reviewed and are negative.     Objective:   Blood pressure (!) 142/88, pulse 85, resp. rate 19, SpO2 95 %. There is no height or weight on file to calculate BMI.   Physical Exam:  Physical Exam Vitals reviewed.  Constitutional:      Appearance: He is well-developed.     Comments: Smiling. Seems somewhat more tired than normal.   HENT:     Head: Normocephalic and atraumatic.     Right Ear: Tympanic membrane, ear canal and external ear normal.     Left Ear: Tympanic membrane, ear canal and external ear normal.     Nose: No nasal deformity, septal deviation, mucosal edema or rhinorrhea.     Right Turbinates: Not enlarged or swollen.     Left Turbinates: Not enlarged or swollen.     Right Sinus: No maxillary sinus tenderness or frontal sinus tenderness.     Left Sinus: No maxillary sinus tenderness or frontal sinus tenderness.  Mouth/Throat:     Mouth: Mucous membranes are not pale and not dry.     Pharynx: Uvula midline.  Eyes:     General: Lids are normal. No allergic shiner.       Right eye: No discharge.        Left eye: No discharge.     Conjunctiva/sclera: Conjunctivae normal.     Right eye: Right conjunctiva is not injected. No chemosis.    Left eye: Left conjunctiva is not injected. No chemosis.    Pupils: Pupils are equal, round, and reactive to light.  Cardiovascular:     Rate and Rhythm: Normal rate and regular rhythm.     Heart sounds: Normal heart sounds.  Pulmonary:     Effort: Pulmonary effort is normal. No tachypnea, accessory muscle usage or respiratory distress.     Breath sounds: Normal breath sounds. No wheezing, rhonchi or rales.     Comments: Diffuse wheezing throughout both lung fields. Mildly tachypneic. Chest:     Chest wall: No tenderness.  Lymphadenopathy:     Cervical: No  cervical adenopathy.  Skin:    Coloration: Skin is not pale.     Findings: No abrasion, erythema, petechiae or rash. Rash is not papular, urticarial or vesicular.  Neurological:     Mental Status: He is alert.  Psychiatric:        Behavior: Behavior is cooperative.     Diagnostic studies:    Spirometry: results abnormal (FEV1: 1.16/42%, FVC: 2.03/54%, FEV1/FVC: 57%).    Spirometry consistent with mixed obstructive and restrictive disease.   Allergy Studies: none        Salvatore Marvel, MD  [facility] and [facility]

## 2021-01-30 NOTE — Patient Instructions (Addendum)
1. Asthma/COPD overlap - Lung testing has decreased since the last time.  - You need to RESTART the Breztri two puffs twice daily in addition to the oxygen at night. - Start the prednisone pack provided: 2 tabs twice daily for one week, 1 tab twice daily for one week, 1 tab daily for one week, then STOP!  - Continue Xolair injections every 2 weeks - May use albuterol 2 puffs every 4 hours as needed for cough, wheeze, tightness in chest, shortness of breath OR may use albuterol via nebulizer 1 ampoule every 4 hours as needed for cough, wheeze, tightness in chest, or shortness of breath. - When you come in for your next Xolair injection finger COVID-19 vaccine card so we can figure out if it is time to receive your booster Asthma control goals:  Full participation in all desired activities (may need albuterol before activity) Albuterol use two time or less a week on average (not counting use with activity) Cough interfering with sleep two time or less a month Oral steroids no more than once a year No hospitalizations  2. Seasonal and perennial allergic rhinitis - May use an antihistamine such as Claritin, Allegra, Xyzal, or Zyrtec once a day as needed for runny nose  3. Follow up in one month.   Please inform us of any Emergency Department visits, hospitalizations, or changes in symptoms. Call us before going to the ED for breathing or allergy symptoms since we might be able to fit you in for a sick visit. Feel free to contact us anytime with any questions, problems, or concerns.  It was a pleasure to see you and your family again today! SEE YOU ON July 16TH!   Websites that have reliable patient information: 1. American Academy of Asthma, Allergy, and Immunology: www.aaaai.org 2. Food Allergy Research and Education (FARE): foodallergy.org 3. Mothers of Asthmatics: http://www.asthmacommunitynetwork.org 4. American College of Allergy, Asthma, and Immunology: www.acaai.org   COVID-19  Vaccine Information can be found at: [email or web address].uk For questions related to vaccine distribution or appointments, please email [email or web address] or call [phone].   We realize that you might be concerned about having an allergic reaction to the COVID19 vaccines. To help with that concern, WE ARE OFFERING THE COVID19 VACCINES IN OUR OFFICE! Ask the front desk for dates!     "Like" [place] on Facebook and Instagram for our latest updates!      A healthy democracy works best when [employer] participate! Make sure you are registered to vote! If you have moved or changed any of your contact information, you will need to get this updated before voting!  In some cases, you MAY be able to register to vote online: [email or web address]

## 2021-01-31 ENCOUNTER — Encounter: Payer: Self-pay | Admitting: Allergy & Immunology

## 2021-01-31 MED ORDER — BREZTRI AEROSPHERE 160-9-4.8 MCG/ACT IN AERO: 2.0000 | INHALATION_SPRAY | Freq: Two times a day (BID) | RESPIRATORY_TRACT | 5 refills | Status: DC

## 2021-01-31 MED ORDER — ALBUTEROL SULFATE HFA 108 (90 BASE) MCG/ACT IN AERS: INHALATION_SPRAY | RESPIRATORY_TRACT | 2 refills | Status: DC

## 2021-02-08 ENCOUNTER — Ambulatory Visit (INDEPENDENT_AMBULATORY_CARE_PROVIDER_SITE_OTHER): Payer: Medicare HMO

## 2021-02-08 ENCOUNTER — Other Ambulatory Visit: Payer: Self-pay

## 2021-02-08 DIAGNOSIS — J4540 Moderate persistent asthma, uncomplicated: Secondary | ICD-10-CM | POA: Diagnosis not present

## 2021-02-08 MED ORDER — OMALIZUMAB 150 MG/ML ~~LOC~~ SOSY
375.0000 mg | PREFILLED_SYRINGE | SUBCUTANEOUS | Status: DC
  Administered 2021-02-08 – 2023-02-11 (×46): 375 mg via SUBCUTANEOUS

## 2021-02-11 DIAGNOSIS — J449 Chronic obstructive pulmonary disease, unspecified: Secondary | ICD-10-CM | POA: Diagnosis not present

## 2021-02-12 ENCOUNTER — Ambulatory Visit: Payer: Medicare HMO | Admitting: Pulmonary Disease

## 2021-02-22 ENCOUNTER — Ambulatory Visit (INDEPENDENT_AMBULATORY_CARE_PROVIDER_SITE_OTHER): Payer: Medicare HMO

## 2021-02-22 ENCOUNTER — Other Ambulatory Visit: Payer: Self-pay

## 2021-02-22 DIAGNOSIS — J4540 Moderate persistent asthma, uncomplicated: Secondary | ICD-10-CM | POA: Diagnosis not present

## 2021-03-08 ENCOUNTER — Other Ambulatory Visit: Payer: Self-pay

## 2021-03-08 ENCOUNTER — Encounter: Payer: Self-pay | Admitting: Family

## 2021-03-08 ENCOUNTER — Ambulatory Visit (INDEPENDENT_AMBULATORY_CARE_PROVIDER_SITE_OTHER): Payer: Medicare HMO | Admitting: *Deleted

## 2021-03-08 ENCOUNTER — Ambulatory Visit: Payer: Medicare HMO | Admitting: Family

## 2021-03-08 VITALS — BP 122/60 | HR 80 | Temp 98.0°F | Resp 16 | Ht 70.0 in | Wt 222.0 lb

## 2021-03-08 DIAGNOSIS — J302 Other seasonal allergic rhinitis: Secondary | ICD-10-CM | POA: Diagnosis not present

## 2021-03-08 DIAGNOSIS — J3089 Other allergic rhinitis: Secondary | ICD-10-CM | POA: Diagnosis not present

## 2021-03-08 DIAGNOSIS — J4540 Moderate persistent asthma, uncomplicated: Secondary | ICD-10-CM

## 2021-03-08 DIAGNOSIS — J449 Chronic obstructive pulmonary disease, unspecified: Secondary | ICD-10-CM

## 2021-03-08 DIAGNOSIS — F17200 Nicotine dependence, unspecified, uncomplicated: Secondary | ICD-10-CM

## 2021-03-08 MED ORDER — BREZTRI AEROSPHERE 160-9-4.8 MCG/ACT IN AERO: 2.0000 | INHALATION_SPRAY | Freq: Two times a day (BID) | RESPIRATORY_TRACT | 5 refills | Status: DC

## 2021-03-08 MED ORDER — ALBUTEROL SULFATE HFA 108 (90 BASE) MCG/ACT IN AERS: INHALATION_SPRAY | RESPIRATORY_TRACT | 2 refills | Status: DC

## 2021-03-08 MED ORDER — ALBUTEROL SULFATE (2.5 MG/3ML) 0.083% IN NEBU: 2.5000 mg | INHALATION_SOLUTION | Freq: Four times a day (QID) | RESPIRATORY_TRACT | 2 refills | Status: DC | PRN

## 2021-03-08 NOTE — Patient Instructions (Addendum)
1. Asthma/COPD overlap Continue the Breztri two puffs twice daily in addition to the oxygen at night. - Continue Xolair injections every 2 weeks - May use albuterol 2 puffs every 4 hours as needed for cough, wheeze, tightness in chest, shortness of breath OR may use albuterol via nebulizer 1 ampoule every 4 hours as needed for cough, wheeze, tightness in chest, or shortness of breath. - When you come in for your next Xolair injection finger COVID-19 vaccine card so we can figure out if it is time to receive your booster Asthma control goals:  Full participation in all desired activities (may need albuterol before activity) Albuterol use two time or less a week on average (not counting use with activity) Cough interfering with sleep two time or less a month Oral steroids no more than once a year No hospitalizations  2. Seasonal and perennial allergic rhinitis - May use an antihistamine such as Claritin, Allegra, Xyzal, or Zyrtec once a day as needed for runny nose  3. Smoking  Discussed smoking cessation   Follow up in two months or sooner if needed.

## 2021-03-08 NOTE — Progress Notes (Signed)
[address], [address] [place] [address] Dept: [phone]  FOLLOW UP NOTE  Patient ID: Kevin Frank, male    DOB: [birth year]  Age: 80 y.o. MRN: [id number] Date of Office Visit: 03/08/2021  Assessment  Chief Complaint: Asthma  HPI Kevin Frank is a 80 year old male who presents today for follow-up of asthma/COPD overlap improved on Xolair every 14 days, seasonal and perennial allergic rhinitis, current smoker, complex medical history, including recently diagnosed lung cancer (detected early, treated with radiation and resection alone), and restrictive pattern of spirometry-slightly better with Breztri.  Was last seen on January 30, 2021 by Dr. Ernst Bowler  Asthma COPD overlap syndrome is reported as moderately controlled with Breztri 2 puffs twice a day, Xolair injections every 2 weeks, and albuterol as needed.  He reports a productive cough with clear sputum and denies wheezing, tightness in his chest, shortness of breath, and nocturnal awakenings due to breathing problems.  Since his last office visit he has not required any systemic steroids or made any trips to the emergency room or urgent care due to breathing problems.  He reports very rare use of albuterol.  He denies any local reactions with his Xolair injections.  He continues to smoke 2 packs of cigarettes per day.  Seasonal and perennial allergic rhinitis is reported as controlled with no medications at this time.  He denies any rhinorrhea, nasal congestion, and postnasal drip.  He has not had any sinus infections since we last saw him.   Drug Allergies:  Allergies  Allergen Reactions   No Known Allergies     Review of Systems: Review of Systems  Constitutional:  Negative for chills and fever.  HENT:         Denies rhinorrhea, nasal congestion, and post nasal drip  Eyes:        Denies itchy watery eyes  Respiratory:  Positive for cough. Negative for shortness of breath and wheezing.        Reports  productive cough with clear sputum and denies wheeze, tightness in chest, or shortness of breath  Cardiovascular:  Negative for chest pain and palpitations.  Gastrointestinal:        Denies heartburn and reflux symptoms  Genitourinary:  Negative for frequency.  Skin:  Negative for itching and rash.  Neurological:  Negative for headaches.  Endo/Heme/Allergies:  Positive for environmental allergies.    Physical Exam: BP 122/60 (BP Location: Left Arm, Patient Position: Sitting, Cuff Size: Normal)   Pulse 80   Temp 98 F (36.7 C) (Temporal)   Resp 16   Ht 5\' 10"  (1.778 m)   Wt 222 lb (100.7 kg)   SpO2 93%   BMI 31.85 kg/m    Physical Exam Constitutional:      Appearance: Normal appearance.  HENT:     Head: Normocephalic and atraumatic.     Comments: Pharynx normal. Eyes normal, ears : unable to see bilateral tympanic membranes due to cerumen, nose: bilateral lower turbinates mildly edematous with no drainage     Right Ear: Ear canal and external ear normal.     Left Ear: Ear canal and external ear normal.     Mouth/Throat:     Mouth: Mucous membranes are moist.     Pharynx: Oropharynx is clear.  Eyes:     Conjunctiva/sclera: Conjunctivae normal.  Cardiovascular:     Rate and Rhythm: Regular rhythm.     Heart sounds: Normal heart sounds.  Pulmonary:     Effort: Pulmonary effort  is normal.     Breath sounds: Normal breath sounds.     Comments: Lungs clear to auscultation Musculoskeletal:     Cervical back: Neck supple.  Skin:    General: Skin is warm.  Neurological:     Mental Status: He is alert and oriented to person, place, and time.  Psychiatric:        Mood and Affect: Mood normal.        Behavior: Behavior normal.        Thought Content: Thought content normal.        Judgment: Judgment normal.    Diagnostics: FVC 2.22 L, FEV1 1.47 L.  Predicted FVC 3.75 L, predicted FEV1 2.78 L.  Spirometry indicates possible moderate to severe restriction.  Assessment and  Plan: 1. Asthma-COPD overlap syndrome ([place])   2. Seasonal and perennial allergic rhinitis   3. Current smoker     Meds ordered this encounter  Medications   albuterol (VENTOLIN HFA) 108 (90 Base) MCG/ACT inhaler    Sig: Use 4 puffs every 4-6 hours as needed for cough or wheeze    Dispense:  18 g    Refill:  2   albuterol (PROVENTIL) (2.5 MG/3ML) 0.083% nebulizer solution    Sig: Take 3 mLs (2.5 mg total) by nebulization every 6 (six) hours as needed for wheezing or shortness of breath.    Dispense:  150 mL    Refill:  2   Budeson-Glycopyrrol-Formoterol (BREZTRI AEROSPHERE) 160-9-4.8 MCG/ACT AERO    Sig: Inhale 2 puffs into the lungs in the morning and at bedtime.    Dispense:  10.7 g    Refill:  5     Patient Instructions  1. Asthma/COPD overlap Continue the Breztri two puffs twice daily in addition to the oxygen at night. - Continue Xolair injections every 2 weeks - May use albuterol 2 puffs every 4 hours as needed for cough, wheeze, tightness in chest, shortness of breath OR may use albuterol via nebulizer 1 ampoule every 4 hours as needed for cough, wheeze, tightness in chest, or shortness of breath. - When you come in for your next Xolair injection finger COVID-19 vaccine card so we can figure out if it is time to receive your booster Asthma control goals:  Full participation in all desired activities (may need albuterol before activity) Albuterol use two time or less a week on average (not counting use with activity) Cough interfering with sleep two time or less a month Oral steroids no more than once a year No hospitalizations  2. Seasonal and perennial allergic rhinitis - May use an antihistamine such as Claritin, Allegra, Xyzal, or Zyrtec once a day as needed for runny nose  3. Smoking  Discussed smoking cessation   Follow up in two months or sooner if needed.      Return in about 2 months (around 05/08/2021), or if symptoms worsen or fail to improve.     Thank you for the opportunity to care for this patient.  Please do not hesitate to contact me with questions.  Althea Charon, FNP All[facility] and [facility]

## 2021-03-11 ENCOUNTER — Other Ambulatory Visit: Payer: Self-pay

## 2021-03-11 ENCOUNTER — Ambulatory Visit (INDEPENDENT_AMBULATORY_CARE_PROVIDER_SITE_OTHER): Payer: Medicare HMO | Admitting: Orthopedic Surgery

## 2021-03-11 ENCOUNTER — Encounter: Payer: Self-pay | Admitting: Orthopedic Surgery

## 2021-03-11 DIAGNOSIS — G8929 Other chronic pain: Secondary | ICD-10-CM

## 2021-03-11 DIAGNOSIS — M25512 Pain in left shoulder: Secondary | ICD-10-CM

## 2021-03-11 NOTE — Progress Notes (Signed)
Chief Complaint  Patient presents with   Shoulder Pain    Left shoulder//wants injection    A steroid injection was performed at left subacromial space using 1% plain Lidocaine and 6 mg of depomedrol. This was well tolerated.  F/u prn

## 2021-03-11 NOTE — Progress Notes (Signed)
Chief Complaint  Patient presents with   Shoulder Pain    Left shoulder//wants injection

## 2021-03-14 DIAGNOSIS — J449 Chronic obstructive pulmonary disease, unspecified: Secondary | ICD-10-CM | POA: Diagnosis not present

## 2021-03-22 ENCOUNTER — Ambulatory Visit: Payer: Medicare HMO

## 2021-03-26 DIAGNOSIS — C44219 Basal cell carcinoma of skin of left ear and external auricular canal: Secondary | ICD-10-CM | POA: Diagnosis not present

## 2021-03-26 DIAGNOSIS — L570 Actinic keratosis: Secondary | ICD-10-CM | POA: Diagnosis not present

## 2021-03-26 DIAGNOSIS — D485 Neoplasm of uncertain behavior of skin: Secondary | ICD-10-CM | POA: Diagnosis not present

## 2021-03-27 ENCOUNTER — Ambulatory Visit (INDEPENDENT_AMBULATORY_CARE_PROVIDER_SITE_OTHER): Payer: Medicare HMO

## 2021-03-27 ENCOUNTER — Other Ambulatory Visit: Payer: Self-pay

## 2021-03-27 DIAGNOSIS — J4540 Moderate persistent asthma, uncomplicated: Secondary | ICD-10-CM | POA: Diagnosis not present

## 2021-03-27 NOTE — Progress Notes (Signed)
Cardiology Office Note    Date:  04/09/2021   ID:  Kevin Frank, DOB 12-30-1940, MRN [id number]   PCP:  Sharilyn Sites, MD   [name]  Cardiologist:  Sherren Mocha, MD   Advanced Practice Provider:  Liliane Shi, PA-C Electrophysiologist:  None   [id number]}   Chief Complaint  Patient presents with   Follow-up     History of Present Illness:  Kevin Frank is a 80 y.o. male with history of Coronary Ca on prior CT, HFpEF, HTN, HLD, DM, tobacco abuse, COPD, lung CA poor surgical candidate S/P XRT, AAA S/P FEVAR required L renal art angioplasty to restore flow  Patient last saw Richardson Dopp, PA-C 10/03/2020 and had chronic shortness of breath related to COPD and ongoing tobacco use as well as chronic heart failure.  Low-sodium diet was recommended.  Patient comes in for f/u. Chronic dyspnea, smoking 2-3 packs/day. On oxygen since hospitalized with pneumonia in June. 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 Has declined since June hospitalization.  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   COLONOSCOPY  2006   2006: multiple diminutive polyps in rectum, one pedunculated polyp at 10 cm s/p snare removal. Left-sided diverticulum, pedunculated polyps at 35 cm and splenic flexure s/p snare removal. No path available.    COLONOSCOPY WITH PROPOFOL N/A 11/24/2019   Procedure: COLONOSCOPY WITH PROPOFOL;  Surgeon: Daneil Dolin, MD;  [facility];  Service: Endoscopy;  Laterality: N/A;  9:45am   PERIPHERAL VASCULAR CATHETERIZATION N/A 04/10/2016   Procedure: Renal Angiography;  Surgeon: Angelia Mould, MD;  Location: [facility];  Service: Cardiovascular;  Laterality: N/A;   POLYPECTOMY  11/24/2019   Procedure: POLYPECTOMY;  Surgeon: Daneil Dolin, MD;  [facility]: [facility];  Service: Endoscopy;;   TRANSURETHRAL RESECTION OF PROSTATE  01/07/2011   Procedure: TRANSURETHRAL RESECTION OF THE PROSTATE (TURP);  Surgeon: Marissa Nestle;  Location: [facility];  Service: Urology;  Laterality: N/A;    Current Medications: Current Meds  Medication Sig   albuterol (PROVENTIL) (2.5 MG/3ML) 0.083% nebulizer solution Take 3 mLs (2.5 mg total) by nebulization every 6 (six) hours as needed for wheezing or shortness of breath.   albuterol (  VENTOLIN HFA) 108 (90 Base) MCG/ACT inhaler Use 4 puffs every 4-6 hours as needed for cough or wheeze   allopurinol (ZYLOPRIM) 300 MG tablet Take 1 tablet (300 mg total) by mouth daily.   amLODipine (NORVASC) 10 MG tablet Take 1 tablet (10 mg total) by mouth daily.   atorvastatin (LIPITOR) 20 MG tablet Take 1 tablet (20 mg total) by mouth daily.   Budeson-Glycopyrrol-Formoterol (BREZTRI AEROSPHERE) 160-9-4.8 MCG/ACT AERO Inhale 2 puffs into the lungs in the morning and at bedtime.   clopidogrel (PLAVIX) 75 MG tablet Take 1 tablet (75 mg total) by mouth daily.    clotrimazole-betamethasone (LOTRISONE) cream    EPINEPHrine 0.3 mg/0.3 mL IJ SOAJ injection    hydrALAZINE (APRESOLINE) 50 MG tablet Take 1 tablet (50 mg total) by mouth in the morning and at bedtime.   JANUVIA 100 MG tablet    losartan (COZAAR) 100 MG tablet TAKE 1 TABLET BY MOUTH ONCE DAILY. (Patient taking differently: Take 100 mg by mouth daily.)   metFORMIN (GLUCOPHAGE) 500 MG tablet Take 1,000 mg by mouth 2 (two) times daily with a meal.    metoprolol succinate (TOPROL-XL) 25 MG 24 hr tablet Take 1 tablet (25 mg total) by mouth daily.   sertraline (ZOLOFT) 50 MG tablet Take 1 tablet (50 mg total) by mouth daily.   torsemide (DEMADEX) 20 MG tablet Take 1 tablet (20 mg total) by mouth daily.   Current Facility-Administered Medications for the 04/09/21 encounter (Office Visit) with Imogene Burn, PA-C  Medication   levalbuterol Penne Lash) nebulizer solution 1.25 mg   omalizumab Arvid Right) prefilled syringe 375 mg     Allergies:   No known allergies   Social History   Socioeconomic History   Marital status: Married    Spouse name: [name]   Number of children: 2   Years of education: Not on file   Highest education level: Not on file  Occupational History   Not on file  Tobacco Use   Smoking status: Every Day    Packs/day: 3.00    Years: 50.00    Pack years: 150.00    Types: Cigarettes   Smokeless tobacco: Never   Tobacco comments:    2ppd 10/30/2016 ee  Vaping Use   Vaping Use: Never used  Substance and Sexual Activity   Alcohol use: Yes    Alcohol/week: 0.0 standard drinks    Comment: very little   Drug use: No   Sexual activity: Not on file  Other Topics Concern   Not on file  Social History Narrative   Lives at home with wife   Social Determinants of Health   Financial Resource Strain: Not on file  Food Insecurity: Not on file  Transportation Needs: Not on file  Physical Activity: Not on file  Stress: Not on file  Social Connections: Not on file      Family History:  The patient's  family history includes Asthma in his daughter; Cancer in his mother; Diabetes in his mother; Hypertension in his mother; Liver disease in his father; Lymphoma in his mother; Mental retardation in an other family member.   ROS:   Please see the history of present illness.    ROS All other systems reviewed and are negative.   PHYSICAL EXAM:   VS:  BP (!) 140/50   Pulse 78   Ht 5\' 10"  (1.778 m)   Wt 219 lb 9.6 oz (99.6 kg)   SpO2 92%   BMI 31.51 kg/m   Physical Exam  GEN: Well nourished, well developed, in no acute distress  Neck: no JVD, carotid bruits, or masses Cardiac:RRR; no murmurs, rubs, or gallops  Respiratory:  clear to auscultation bilaterally, normal work of breathing GI: soft, nontender, nondistended, + BS Ext: without cyanosis, clubbing, or edema, Good distal pulses bilaterally Neuro:  Alert and Oriented x 3 Psych: euthymic mood, full affect  Wt Readings from Last 3 Encounters:  04/09/21 219 lb 9.6 oz (99.6 kg)  03/08/21 222 lb (100.7 kg)  01/04/21 219 lb (99.3 kg)      Studies/Labs Reviewed:   EKG:  EKG is not ordered today.    Recent Labs: 12/11/2020: ALT 23 12/12/2020: BUN 25; Creatinine, Ser 0.83; Hemoglobin 15.0; Platelets 176; Potassium 4.0; Sodium 135   Lipid Panel    Component Value Date/Time   CHOL 117 03/23/2019 0000   TRIG 89 03/23/2019 0000   HDL 39 03/23/2019 0000   CHOLHDL 3 12/16/2010 0854   VLDL 10.6 12/16/2010 0854   LDLCALC 61 03/23/2019 0000    Additional studies/ records that were reviewed today include:   Echo 11/15/19 IMPRESSIONS     1. Normal LV systolic function; mild LVE; mild LVH; grade 1 diastolic  dysfunction; mildly dilated ascending aorta.   2. Left ventricular ejection fraction, by estimation, is 60 to 65%. The  left ventricle has normal function. The left ventricle has no regional  wall motion abnormalities. The left ventricular internal cavity size was  mildly dilated. There is  mild left  ventricular hypertrophy. Left ventricular diastolic parameters are  consistent with Grade I diastolic dysfunction (impaired relaxation).  Elevated left atrial pressure.   3. Right ventricular systolic function is normal. The right ventricular  size is normal. There is mildly elevated pulmonary artery systolic  pressure.   4. The mitral valve is normal in structure. Trivial mitral valve  regurgitation. No evidence of mitral stenosis.   5. The aortic valve is tricuspid. Aortic valve regurgitation is not  visualized. Mild aortic valve sclerosis is present, with no evidence of  aortic valve stenosis.   6. Aortic dilatation noted. There is mild dilatation of the ascending  aorta measuring 41 mm.   7. The inferior vena cava is normal in size with greater than 50%  respiratory variability, suggesting right atrial pressure of 3 mmHg.    Risk Assessment/Calculations:         ASSESSMENT:    1. Coronary artery calcification   2. Chronic heart failure with preserved ejection fraction (HCC)   3. Abdominal aortic aneurysm (AAA) without rupture, unspecified part   4. Essential hypertension   5. Hyperlipidemia, unspecified hyperlipidemia type   6. Stage 2 moderate COPD by GOLD classification ([place])   7. Squamous cell carcinoma of right lung (HCC)      PLAN:  In order of problems listed above:  Coronary artery calcification on prior chest CT-no chest pain, continues to smoke 2-3 ppd. Smoking cessation discussed. On lipitor. Wants to f/u in [place] office as they live in [place].  Chronic heart failure with preserved ejection fraction EF 60 to 65% on echo 10/2019 compensated  AAA without rupture status post endovascular repair followed by Dr. Trula Slade yearly in Jan.  Essential hypertension BP controlled  Hyperlipidemia labs checked by Dr. Nevada Crane  Stage II moderate COPD by gold standards  History of lung CA status postradiation  Shared Decision Making/Informed Consent         Medication Adjustments/Labs and Tests Ordered: Current medicines are reviewed at length with the patient  today.  Concerns regarding medicines are outlined above.  Medication changes, Labs and Tests ordered today are listed in the Patient Instructions below. Patient Instructions  Medication Instructions:  Your physician recommends that you continue on your current medications as directed. Please refer to the Current Medication list given to you today.  *If you need a refill on your cardiac medications before your next appointment, please call your pharmacy*   Lab Work: None If you have labs (blood work) drawn today and your tests are completely normal, you will receive your results only by: [place] (if you have MyChart) OR A paper copy in the mail If you have any lab test that is abnormal or we need to change your treatment, we will call you to review the results.   Follow-Up: At [facility], you and your health needs are our priority.  As part of our continuing mission to provide you with exceptional heart care, we have created designated Provider Care Teams.  These Care Teams include your primary Cardiologist (physician) and Advanced Practice Providers (APPs -  Physician Assistants and Nurse Practitioners) who all work together to provide you with the care you need, when you need it.  We recommend signing up for the patient portal called "MyChart".  Sign up information is provided on this After Visit Summary.  MyChart is used to connect with patients for Virtual Visits (Telemedicine).  Patients are able to view lab/test results, encounter notes, upcoming appointments, etc.  Non-urgent messages can be sent to your provider as well.   To learn more about what you can do with MyChart, go to [email or web address].    Your next appointment:   6 month(s)  The format for your next appointment:   In Person  Provider:   Any MD in [place] to establish care       Signed, Ermalinda Barrios, PA-C  04/09/2021 1:37 PM    [address], [place], [place]  [address] Phone: [phone]; Fax: [phone]

## 2021-04-08 ENCOUNTER — Ambulatory Visit: Payer: Medicare HMO | Admitting: Pulmonary Disease

## 2021-04-09 ENCOUNTER — Encounter: Payer: Self-pay | Admitting: Physician Assistant

## 2021-04-09 ENCOUNTER — Other Ambulatory Visit: Payer: Self-pay

## 2021-04-09 ENCOUNTER — Encounter (INDEPENDENT_AMBULATORY_CARE_PROVIDER_SITE_OTHER): Payer: Self-pay

## 2021-04-09 ENCOUNTER — Ambulatory Visit: Payer: Medicare HMO | Admitting: Physician Assistant

## 2021-04-09 VITALS — BP 140/50 | HR 78 | Ht 70.0 in | Wt 219.6 lb

## 2021-04-09 DIAGNOSIS — I10 Essential (primary) hypertension: Secondary | ICD-10-CM

## 2021-04-09 DIAGNOSIS — C3491 Malignant neoplasm of unspecified part of right bronchus or lung: Secondary | ICD-10-CM | POA: Diagnosis not present

## 2021-04-09 DIAGNOSIS — I5032 Chronic diastolic (congestive) heart failure: Secondary | ICD-10-CM | POA: Diagnosis not present

## 2021-04-09 DIAGNOSIS — I2510 Atherosclerotic heart disease of native coronary artery without angina pectoris: Secondary | ICD-10-CM

## 2021-04-09 DIAGNOSIS — I2584 Coronary atherosclerosis due to calcified coronary lesion: Secondary | ICD-10-CM

## 2021-04-09 DIAGNOSIS — I714 Abdominal aortic aneurysm, without rupture, unspecified: Secondary | ICD-10-CM

## 2021-04-09 DIAGNOSIS — E785 Hyperlipidemia, unspecified: Secondary | ICD-10-CM | POA: Diagnosis not present

## 2021-04-09 DIAGNOSIS — J449 Chronic obstructive pulmonary disease, unspecified: Secondary | ICD-10-CM | POA: Diagnosis not present

## 2021-04-09 NOTE — Patient Instructions (Signed)
Medication Instructions:  Your physician recommends that you continue on your current medications as directed. Please refer to the Current Medication list given to you today.  *If you need a refill on your cardiac medications before your next appointment, please call your pharmacy*   Lab Work: None If you have labs (blood work) drawn today and your tests are completely normal, you will receive your results only by: [place] (if you have MyChart) OR A paper copy in the mail If you have any lab test that is abnormal or we need to change your treatment, we will call you to review the results.   Follow-Up: At [facility], you and your health needs are our priority.  As part of our continuing mission to provide you with exceptional heart care, we have created designated Provider Care Teams.  These Care Teams include your primary Cardiologist (physician) and Advanced Practice Providers (APPs -  Physician Assistants and Nurse Practitioners) who all work together to provide you with the care you need, when you need it.  We recommend signing up for the patient portal called "MyChart".  Sign up information is provided on this After Visit Summary.  MyChart is used to connect with patients for Virtual Visits (Telemedicine).  Patients are able to view lab/test results, encounter notes, upcoming appointments, etc.  Non-urgent messages can be sent to your provider as well.   To learn more about what you can do with MyChart, go to [email or web address].    Your next appointment:   6 month(s)  The format for your next appointment:   In Person  Provider:   Any MD in [place] to establish care

## 2021-04-10 ENCOUNTER — Ambulatory Visit (INDEPENDENT_AMBULATORY_CARE_PROVIDER_SITE_OTHER): Payer: Medicare HMO

## 2021-04-10 DIAGNOSIS — J4540 Moderate persistent asthma, uncomplicated: Secondary | ICD-10-CM

## 2021-04-13 DIAGNOSIS — J449 Chronic obstructive pulmonary disease, unspecified: Secondary | ICD-10-CM | POA: Diagnosis not present

## 2021-04-18 DIAGNOSIS — C44219 Basal cell carcinoma of skin of left ear and external auricular canal: Secondary | ICD-10-CM | POA: Diagnosis not present

## 2021-04-19 ENCOUNTER — Ambulatory Visit (INDEPENDENT_AMBULATORY_CARE_PROVIDER_SITE_OTHER): Payer: Medicare HMO | Admitting: Allergy & Immunology

## 2021-04-19 ENCOUNTER — Other Ambulatory Visit: Payer: Self-pay

## 2021-04-19 VITALS — BP 124/72 | HR 82 | Temp 97.6°F | Resp 18 | Ht 70.0 in | Wt 221.0 lb

## 2021-04-19 DIAGNOSIS — J45901 Unspecified asthma with (acute) exacerbation: Secondary | ICD-10-CM

## 2021-04-19 DIAGNOSIS — J302 Other seasonal allergic rhinitis: Secondary | ICD-10-CM | POA: Diagnosis not present

## 2021-04-19 DIAGNOSIS — J441 Chronic obstructive pulmonary disease with (acute) exacerbation: Secondary | ICD-10-CM | POA: Diagnosis not present

## 2021-04-19 DIAGNOSIS — F17200 Nicotine dependence, unspecified, uncomplicated: Secondary | ICD-10-CM

## 2021-04-19 DIAGNOSIS — J4541 Moderate persistent asthma with (acute) exacerbation: Secondary | ICD-10-CM

## 2021-04-19 DIAGNOSIS — J3089 Other allergic rhinitis: Secondary | ICD-10-CM

## 2021-04-19 MED ORDER — LEVOFLOXACIN 750 MG PO TABS: 750.0000 mg | ORAL_TABLET | Freq: Every day | ORAL | 0 refills | Status: AC

## 2021-04-19 MED ORDER — METHYLPREDNISOLONE ACETATE 40 MG/ML IJ SUSP
40.0000 mg | Freq: Once | INTRAMUSCULAR | Status: AC
  Administered 2021-04-19: 40 mg via INTRAMUSCULAR

## 2021-04-19 NOTE — Patient Instructions (Addendum)
1. Asthma/COPD overlap - with acute exacerbation - Lung testing NOT DONE - You were wheezing especially on the right side. - DepoMedrol shot given in clinic. - Start the prednisone pack TOMORROW morning. - Start the Levaquin one tablet daily for TEN DAYS (rarely can cause tendon ruptures, so no intense physical activity). - Call us Monday with an update!  - Continue with your Breztri two puffs twice daily in addition to the oxygen at night. - Continue Xolair injections every 2 weeks - May use albuterol 2 puffs every 4 hours as needed for cough, wheeze, tightness in chest, shortness of breath OR may use albuterol via nebulizer 1 ampoule every 4 hours as needed for cough, wheeze, tightness in chest, or shortness of breath. - When you come in for your next Xolair injection finger COVID-19 vaccine card so we can figure out if it is time to receive your booster Asthma control goals:  Full participation in all desired activities (may need albuterol before activity) Albuterol use two time or less a week on average (not counting use with activity) Cough interfering with sleep two time or less a month Oral steroids no more than once a year No hospitalizations  2. Seasonal and perennial allergic rhinitis - May use an antihistamine such as Claritin, Allegra, Xyzal, or Zyrtec once a day as needed for runny nose  3. Follow up as scheduled.     Please inform us of any Emergency Department visits, hospitalizations, or changes in symptoms. Call us before going to the ED for breathing or allergy symptoms since we might be able to fit you in for a sick visit. Feel free to contact us anytime with any questions, problems, or concerns.  It was a pleasure to see you and your family again today! Enjoy the dinner tonight!   Websites that have reliable patient information: 1. American Academy of Asthma, Allergy, and Immunology: www.aaaai.org 2. Food Allergy Research and Education (FARE): foodallergy.org 3.  Mothers of Asthmatics: http://www.asthmacommunitynetwork.org 4. American College of Allergy, Asthma, and Immunology: www.acaai.org   COVID-19 Vaccine Information can be found at: ShippingScam.co.uk For questions related to vaccine distribution or appointments, please email vaccine@[email or web address] [email or web address] or call [phone].   We realize that you might be concerned about having an allergic reaction to the COVID19 vaccines. To help with that concern, WE ARE OFFERING THE COVID19 VACCINES IN OUR OFFICE! Ask the front desk for dates!     "Like" [place] on Facebook and Instagram for our latest updates!      A healthy democracy works best when [employer] participate! Make sure you are registered to vote! If you have moved or changed any of your contact information, you will need to get this updated before voting!  In some cases, you MAY be able to register to vote online: [email or web address]    EARLY VOTING HAS STARTED! If you still need to register to vote, you can do this and cast a ballot at any of the early voting locations!

## 2021-04-19 NOTE — Progress Notes (Signed)
FOLLOW UP  Date of Service/Encounter:  04/19/21   Assessment:   Asthma-COPD overlap syndrome - improved on Xolair every 14 days but now with acute exacerbation   Seasonal and perennial allergic rhinitis (cat, dog, cockroach, trees, weeds, and ragweed)   Current smoker   Complex medical history, including a recently diagnosed lung cancer (detected early, treated with radiation and resection alone)   Restrictive pattern of spirometry - slightly better with the [facility], although this might be his new baseline  Plan/Recommendations:   1. Asthma/COPD overlap - with acute exacerbation - Lung testing NOT DONE - You were wheezing especially on the right side. - DepoMedrol shot given in clinic. - Start the prednisone pack TOMORROW morning. - Start the Levaquin one tablet daily for TEN DAYS (rarely can cause tendon ruptures, so no intense physical activity). - Call us Monday with an update!  - Continue with your Breztri two puffs twice daily in addition to the oxygen at night. - Continue Xolair injections every 2 weeks - May use albuterol 2 puffs every 4 hours as needed for cough, wheeze, tightness in chest, shortness of breath OR may use albuterol via nebulizer 1 ampoule every 4 hours as needed for cough, wheeze, tightness in chest, or shortness of breath. - When you come in for your next Xolair injection finger COVID-19 vaccine card so we can figure out if it is time to receive your booster Asthma control goals:  Full participation in all desired activities (may need albuterol before activity) Albuterol use two time or less a week on average (not counting use with activity) Cough interfering with sleep two time or less a month Oral steroids no more than once a year No hospitalizations  2. Seasonal and perennial allergic rhinitis - May use an antihistamine such as Claritin, Allegra, Xyzal, or Zyrtec once a day as needed for runny nose  3. Follow up as scheduled.     Subjective:    Kevin Frank is a 80 y.o. male presenting today for follow up of  Chief Complaint  Patient presents with   Cough    Patient in today for very active cough that leaves him unable to catch his breath for 5 days. No fever, no sore thorat. Flu shot yesterday.    Kevin Frank has a history of the following: Patient Active Problem List   Diagnosis Date Noted   Lobar pneumonia ([place]) 12/11/2020   Acute respiratory failure ([place]) 12/10/2020   Wrist pain, acute, right 09/07/2019   Right forearm pain 09/07/2019   Uncontrolled type 2 diabetes mellitus with hyperglycemia ([place]) 06/27/2019   Mixed hyperlipidemia 06/27/2019   Class 1 obesity due to excess calories with serious comorbidity and body mass index (BMI) of 32.0 to 32.9 in adult 06/27/2019   Diabetes mellitus without complication ([place]) 40/01/6760   Positive colorectal cancer screening using Cologuard test 06/14/2019   Squamous cell carcinoma of right lung ([place]) 03/29/2018   Seasonal and perennial allergic rhinitis 09/22/2017   Asthma-COPD overlap syndrome ([place]) 09/22/2017   Obstructive sleep apnea on CPAP 04/23/2017   Chronic knee pain 04/23/2017   AKI (acute kidney injury) ([place]) 04/23/2017   Chronic diastolic CHF (congestive heart failure) ([place]) 04/23/2017   Cellulitis 04/23/2017   Cellulitis of left lower leg    Cigarette smoker 03/05/2016   Incidental lung nodule, > 110mm and < 29mm 03/05/2016   Stage 2 moderate COPD by GOLD classification ([place]) 03/05/2016   AAA (abdominal aortic aneurysm) without rupture 06/14/2015  OBSTRUCTIVE SLEEP APNEA 06/09/2007   HYPERCHOLESTEROLEMIA 05/06/2007   GOUT 05/06/2007   DEPRESSION 05/06/2007   Essential hypertension, benign 05/06/2007   ABDOMINAL AORTIC ANEURYSM 05/06/2007   BENIGN PROSTATIC HYPERTROPHY, HX OF 05/06/2007    History obtained from: chart review and patient.  Kevin Frank is a 79 y.o. male presenting for a follow up visit.  He was last seen in September 2022 by Dr. Althea Charon.  At that time, he was doing very well on Humira every 2 weeks.  He was also continued on Breztri 2 puffs twice daily as well as oxygen at night.  For his allergic rhinitis, he was continued on an over-the-counter antihistamine.  In the interim, he has been sick for around one week. There are no sick contacts. He has had a low grade fever. Overall he has been coughing and having more shortness of breath with some chest tightness. He denies chest pain. He is having some sputum production. He did COVID testing that was negative at home.   He remains on his Breztri two puffs BID as well as albuterol as needed. He is up to date on his Xolair. He has not had steroids since he was in the hospital in early summer.   Otherwise, there have been no changes to his past medical history, surgical history, family history, or social history.    Review of Systems  Constitutional: Negative.  Negative for chills, fever, malaise/fatigue and weight loss.  HENT: Negative.  Negative for congestion, ear discharge, ear pain, nosebleeds and sinus pain.   Eyes:  Negative for pain, discharge and redness.  Respiratory:  Positive for cough, sputum production, shortness of breath and wheezing.   Cardiovascular: Negative.  Negative for chest pain and palpitations.  Gastrointestinal:  Negative for abdominal pain, constipation, diarrhea, heartburn, nausea and vomiting.  Skin: Negative.  Negative for itching and rash.  Neurological:  Negative for dizziness and headaches.  Endo/Heme/Allergies:  Negative for environmental allergies. Does not bruise/bleed easily.      Objective:   Blood pressure 124/72, pulse 82, temperature 97.6 F (36.4 C), temperature source Temporal, resp. rate 18, height 5\' 10"  (1.778 m), weight 221 lb (100.2 kg), SpO2 92 %. Body mass index is 31.71 kg/m.   Physical Exam:  Physical Exam Vitals reviewed.  Constitutional:      Appearance: He is well-developed.     Comments: Less boisterous  than normal.   HENT:     Head: Normocephalic and atraumatic.     Right Ear: Tympanic membrane, ear canal and external ear normal.     Left Ear: Tympanic membrane, ear canal and external ear normal.     Nose: No nasal deformity, septal deviation, mucosal edema or rhinorrhea.     Right Turbinates: Enlarged and pale.     Left Turbinates: Enlarged, swollen and pale.     Right Sinus: No maxillary sinus tenderness or frontal sinus tenderness.     Left Sinus: No maxillary sinus tenderness or frontal sinus tenderness.     Mouth/Throat:     Mouth: Mucous membranes are not pale and not dry.     Pharynx: Uvula midline.  Eyes:     General: Lids are normal. No allergic shiner.       Right eye: No discharge.        Left eye: No discharge.     Conjunctiva/sclera: Conjunctivae normal.     Right eye: Right conjunctiva is not injected. No chemosis.    Left eye: Left conjunctiva is not  injected. No chemosis.    Pupils: Pupils are equal, round, and reactive to light.  Cardiovascular:     Rate and Rhythm: Normal rate and regular rhythm.     Heart sounds: Normal heart sounds.  Pulmonary:     Effort: Pulmonary effort is normal. No tachypnea, accessory muscle usage or respiratory distress.     Breath sounds: Examination of the right-middle field reveals rhonchi. Examination of the left-middle field reveals rhonchi. Examination of the right-lower field reveals rhonchi. Examination of the left-lower field reveals rhonchi. Rhonchi present. No wheezing or rales.  Chest:     Chest wall: No tenderness.  Lymphadenopathy:     Cervical: No cervical adenopathy.  Skin:    Coloration: Skin is not pale.     Findings: No abrasion, erythema, petechiae or rash. Rash is not papular, urticarial or vesicular.  Neurological:     Mental Status: He is alert.  Psychiatric:        Behavior: Behavior is cooperative.     Diagnostic studies: none     Salvatore Marvel, MD  [facility] [place] [facility] [place]

## 2021-04-23 ENCOUNTER — Encounter: Payer: Self-pay | Admitting: Allergy & Immunology

## 2021-04-24 ENCOUNTER — Ambulatory Visit: Payer: Medicare HMO

## 2021-04-26 ENCOUNTER — Ambulatory Visit (INDEPENDENT_AMBULATORY_CARE_PROVIDER_SITE_OTHER): Payer: Medicare HMO

## 2021-04-26 ENCOUNTER — Other Ambulatory Visit: Payer: Self-pay

## 2021-04-26 DIAGNOSIS — J4540 Moderate persistent asthma, uncomplicated: Secondary | ICD-10-CM | POA: Diagnosis not present

## 2021-04-29 ENCOUNTER — Telehealth: Payer: Self-pay | Admitting: Allergy & Immunology

## 2021-04-29 DIAGNOSIS — R051 Acute cough: Secondary | ICD-10-CM

## 2021-04-29 NOTE — Telephone Encounter (Signed)
Patient's wife called stating she believes the patient has bronchitis and needs to be seen. Patient wanted to be seen by Dr. Ernst Bowler in the [place] office. Unfortunately, Dr. Ernst Bowler is completely booked out for this Wednesday in the [place] office. I did offer this Thursday 05/02/2021 with Dr. Simona Huh. Patient stated she could not come to [facility] due to feeling so sick. Patient's wife did want me to send a message to Dr. Ernst Bowler to see if he is willing to double book and see patient.   Best contact number: [phone]

## 2021-04-29 NOTE — Telephone Encounter (Signed)
Please let them know that I will gladly see them on Wednesday if they would like. If his symptoms worsen I recommend they go to the ER or urgent care. Please let them know that Dr. Ernst Bowler does not come back in the office until tomorrow.

## 2021-04-29 NOTE — Telephone Encounter (Signed)
Error

## 2021-04-30 ENCOUNTER — Inpatient Hospital Stay (HOSPITAL_COMMUNITY): Payer: Medicare HMO | Attending: Hematology

## 2021-04-30 NOTE — Telephone Encounter (Signed)
Patient wife said he was doing better and did not need a chest x-ray.

## 2021-04-30 NOTE — Telephone Encounter (Signed)
I called the home phone & cell phone but the patient didn't answer. Left a voicemail to give our a office a call to discuss doing a chest x ray.

## 2021-04-30 NOTE — Telephone Encounter (Signed)
He needs a chest x-ray.  I treated him with Levaquin a couple of weeks ago and his wife told me that he was doing better as recently as Friday.  I ordered a chest x-ray.  Can you have him go to [facility]?  Salvatore Marvel, MD [facility] [place] [facility] [place]

## 2021-05-01 NOTE — Telephone Encounter (Signed)
Ok sounds good. No problem!   Salvatore Marvel, MD [facility] [place] [facility] [place]

## 2021-05-06 ENCOUNTER — Ambulatory Visit (INDEPENDENT_AMBULATORY_CARE_PROVIDER_SITE_OTHER): Payer: Medicare HMO | Admitting: Orthopedic Surgery

## 2021-05-06 ENCOUNTER — Other Ambulatory Visit: Payer: Self-pay

## 2021-05-06 ENCOUNTER — Inpatient Hospital Stay (HOSPITAL_COMMUNITY): Payer: Medicare HMO | Attending: Hematology

## 2021-05-06 DIAGNOSIS — G8929 Other chronic pain: Secondary | ICD-10-CM

## 2021-05-06 DIAGNOSIS — C3490 Malignant neoplasm of unspecified part of unspecified bronchus or lung: Secondary | ICD-10-CM

## 2021-05-06 DIAGNOSIS — M25511 Pain in right shoulder: Secondary | ICD-10-CM | POA: Diagnosis not present

## 2021-05-06 DIAGNOSIS — C3411 Malignant neoplasm of upper lobe, right bronchus or lung: Secondary | ICD-10-CM | POA: Insufficient documentation

## 2021-05-06 DIAGNOSIS — F17210 Nicotine dependence, cigarettes, uncomplicated: Secondary | ICD-10-CM | POA: Diagnosis not present

## 2021-05-06 DIAGNOSIS — C3491 Malignant neoplasm of unspecified part of right bronchus or lung: Secondary | ICD-10-CM

## 2021-05-06 LAB — COMPREHENSIVE METABOLIC PANEL
ALT: 17 U/L (ref 0–44)
AST: 15 U/L (ref 15–41)
Albumin: 3.9 g/dL (ref 3.5–5.0)
Alkaline Phosphatase: 91 U/L (ref 38–126)
Anion gap: 10 (ref 5–15)
BUN: 13 mg/dL (ref 8–23)
CO2: 26 mmol/L (ref 22–32)
Calcium: 9.1 mg/dL (ref 8.9–10.3)
Chloride: 97 mmol/L — ABNORMAL LOW (ref 98–111)
Creatinine, Ser: 0.91 mg/dL (ref 0.61–1.24)
GFR, Estimated: >60 mL/min (ref >60)
Glucose, Bld: 206 mg/dL — ABNORMAL HIGH (ref 70–99)
Potassium: 3.3 mmol/L — ABNORMAL LOW (ref 3.5–5.1)
Sodium: 133 mmol/L — ABNORMAL LOW (ref 135–145)
Total Bilirubin: 0.8 mg/dL (ref 0.3–1.2)
Total Protein: 7.5 g/dL (ref 6.5–8.1)

## 2021-05-06 LAB — CBC WITH DIFFERENTIAL/PLATELET
Abs Immature Granulocytes: 0.07 10*3/uL (ref 0.00–0.07)
Basophils Absolute: 0.1 10*3/uL (ref 0.0–0.1)
Basophils Relative: 1 %
Eosinophils Absolute: 0.2 10*3/uL (ref 0.0–0.5)
Eosinophils Relative: 1 %
HCT: 46.3 % (ref 39.0–52.0)
Hemoglobin: 15.8 g/dL (ref 13.0–17.0)
Immature Granulocytes: 1 %
Lymphocytes Relative: 12 %
Lymphs Abs: 1.7 10*3/uL (ref 0.7–4.0)
MCH: 32.8 pg (ref 26.0–34.0)
MCHC: 34.1 g/dL (ref 30.0–36.0)
MCV: 96.1 fL (ref 80.0–100.0)
Monocytes Absolute: 1.2 10*3/uL — ABNORMAL HIGH (ref 0.1–1.0)
Monocytes Relative: 9 %
Neutro Abs: 10.7 10*3/uL — ABNORMAL HIGH (ref 1.7–7.7)
Neutrophils Relative %: 76 %
Platelets: 171 10*3/uL (ref 150–400)
RBC: 4.82 MIL/uL (ref 4.22–5.81)
RDW: 14.7 % (ref 11.5–15.5)
WBC: 13.9 10*3/uL — ABNORMAL HIGH (ref 4.0–10.5)
nRBC: 0 % (ref 0.0–0.2)

## 2021-05-06 NOTE — Progress Notes (Shared)
[facility] [place], [place] [address]   CLINIC:  Medical Oncology/Hematology  PCP:  Sharilyn Sites, [place] / [place] [address] [phone]   REASON FOR VISIT:  Follow-up for ***  PRIOR THERAPY: ***  NGS Results: ***  CURRENT THERAPY: ***  BRIEF ONCOLOGIC HISTORY:  Oncology History  Squamous cell carcinoma of right lung ([place])  03/29/2018 Initial Diagnosis   Squamous cell carcinoma of right lung ([place])   03/29/2018 Cancer Staging   Staging form: Lung, AJCC 8th Edition - Clinical stage from 03/29/2018: Stage IA3 (cT1c, cN0, cM0) - Signed by Derek Jack, MD on 03/29/2018      CANCER STAGING: Cancer Staging Squamous cell carcinoma of right lung [facility]) Staging form: Lung, AJCC 8th Edition - Clinical stage from 03/29/2018: Stage IA3 (cT1c, cN0, cM0) - Signed by Derek Jack, MD on 03/29/2018   INTERVAL HISTORY:  [name], a 80 y.o. male, returns for routine follow-up of his ***. [name] was last seen on {XX/XX/XXXX}.    REVIEW OF SYSTEMS:  Review of Systems - Oncology  PAST MEDICAL/SURGICAL HISTORY:  Past Medical History:  Diagnosis Date   AAA (abdominal aortic aneurysm) 01/2008   3.2cm   Allergy    Arthritis    Asthma    BPH (benign prostatic hyperplasia)    Chronic knee pain    bilateral   COPD (chronic obstructive pulmonary disease) (HCC)    Diabetes mellitus    type II   Gout    History of kidney stones    Hypercholesterolemia    Hyperlipidemia    Hypertension    Lung cancer ([place]) 2019   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    Normal nuclear stress test 2011   Obesity    Obstructive sleep apnea on CPAP    Presence of indwelling urinary catheter    Shortness of breath    due to cigarette abuse   Tobacco abuse    Past Surgical History:  Procedure Laterality Date   ABDOMINAL AORTIC ENDOVASCULAR FENESTRATED STENT GRAFT N/A 04/09/2016   Procedure: ABDOMINAL  AORTIC ENDOVASCULAR FENESTRATED STENT GRAFT with left brachial artery access using ultrasound guidance;  Surgeon: Serafina Mitchell, MD;  Location: [place] [facility] OR;  Service: Vascular;  Laterality: N/A;   [place]   [facility]   COLONOSCOPY  2006   2006: multiple diminutive polyps in rectum, one pedunculated polyp at 10 cm s/p snare removal. Left-sided diverticulum, pedunculated polyps at 35 cm and splenic flexure s/p snare removal. No path available.    COLONOSCOPY WITH PROPOFOL N/A 11/24/2019   Procedure: COLONOSCOPY WITH PROPOFOL;  Surgeon: Daneil Dolin, MD;  [facility];  Service: Endoscopy;  Laterality: N/A;  9:45am   PERIPHERAL VASCULAR CATHETERIZATION N/A 04/10/2016   Procedure: Renal Angiography;  Surgeon: Angelia Mould, MD;  Location: [facility] LAB;  Service: Cardiovascular;  Laterality: N/A;   POLYPECTOMY  11/24/2019   Procedure: POLYPECTOMY;  Surgeon: Daneil Dolin, MD;  Location: [facility];  Service: Endoscopy;;   TRANSURETHRAL RESECTION OF PROSTATE  01/07/2011   Procedure: TRANSURETHRAL RESECTION OF THE PROSTATE (TURP);  Surgeon: Marissa Nestle;  Location: [facility];  Service: Urology;  Laterality: N/A;    SOCIAL HISTORY:  Social History   Socioeconomic History   Marital status: Married    Spouse name: [name]   Number of children: 2   Years of education: Not on file   Highest  education level: Not on file  Occupational History   Not on file  Tobacco Use   Smoking status: Every Day    Packs/day: 3.00    Years: 50.00    Pack years: 150.00    Types: Cigarettes   Smokeless tobacco: Never   Tobacco comments:    2ppd 10/30/2016 ee  Vaping Use   Vaping Use: Never used  Substance and Sexual Activity   Alcohol use: Yes    Alcohol/week: 0.0 standard drinks    Comment: very little   Drug use: No   Sexual activity: Not on file  Other Topics Concern   Not on file  Social History Narrative   Lives at home with wife   Social Determinants of Health    Financial Resource Strain: Not on file  Food Insecurity: Not on file  Transportation Needs: Not on file  Physical Activity: Not on file  Stress: Not on file  Social Connections: Not on file  Intimate Partner Violence: Not on file    FAMILY HISTORY:  Family History  Problem Relation Age of Onset   Liver disease Father    Lymphoma Mother    Diabetes Mother    Hypertension Mother    Cancer Mother    Mental retardation Other        sibling -- from birth    Asthma Daughter    Allergic rhinitis Neg Hx    Angioedema Neg Hx    Atopy Neg Hx    Eczema Neg Hx    Immunodeficiency Neg Hx    Colon cancer Neg Hx     CURRENT MEDICATIONS:  Current Outpatient Medications  Medication Sig Dispense Refill   albuterol (PROVENTIL) (2.5 MG/3ML) 0.083% nebulizer solution Take 3 mLs (2.5 mg total) by nebulization every 6 (six) hours as needed for wheezing or shortness of breath. 150 mL 2   albuterol (VENTOLIN HFA) 108 (90 Base) MCG/ACT inhaler Use 4 puffs every 4-6 hours as needed for cough or wheeze 18 g 2   allopurinol (ZYLOPRIM) 300 MG tablet Take 1 tablet (300 mg total) by mouth daily. 30 tablet 1   amLODipine (NORVASC) 10 MG tablet Take 1 tablet (10 mg total) by mouth daily. 14 tablet 0   atorvastatin (LIPITOR) 20 MG tablet Take 1 tablet (20 mg total) by mouth daily. 90 tablet 1   Budeson-Glycopyrrol-Formoterol (BREZTRI AEROSPHERE) 160-9-4.8 MCG/ACT AERO Inhale 2 puffs into the lungs in the morning and at bedtime. 10.7 g 5   clopidogrel (PLAVIX) 75 MG tablet Take 1 tablet (75 mg total) by mouth daily. 30 tablet 1   clotrimazole-betamethasone (LOTRISONE) cream      EPINEPHrine 0.3 mg/0.3 mL IJ SOAJ injection      hydrALAZINE (APRESOLINE) 50 MG tablet Take 1 tablet (50 mg total) by mouth in the morning and at bedtime.     JANUVIA 100 MG tablet      losartan (COZAAR) 100 MG tablet TAKE 1 TABLET BY MOUTH ONCE DAILY. (Patient taking differently: Take 100 mg by mouth daily.) 30 tablet 0    metFORMIN (GLUCOPHAGE) 500 MG tablet Take 1,000 mg by mouth 2 (two) times daily with a meal.      metoprolol succinate (TOPROL-XL) 25 MG 24 hr tablet Take 1 tablet (25 mg total) by mouth daily. 14 tablet 0   predniSONE (DELTASONE) 10 MG tablet Take 2 tabs twice daily for one week, 1 tab twice daily for one week, 1 tab daily for one week, then STOP! 49 tablet 0  sertraline (ZOLOFT) 50 MG tablet Take 1 tablet (50 mg total) by mouth daily. 30 tablet 1   torsemide (DEMADEX) 20 MG tablet Take 1 tablet (20 mg total) by mouth daily.     Current Facility-Administered Medications  Medication Dose Route Frequency Provider Last Rate Last Admin   levalbuterol (XOPENEX) nebulizer solution 1.25 mg  1.25 mg Nebulization Once Gean Quint, MD       omalizumab Arvid Right) prefilled syringe 375 mg  375 mg Subcutaneous Q14 Days Valentina Shaggy, MD   375 mg at 04/26/21 1403    ALLERGIES:  Allergies  Allergen Reactions   No Known Allergies     PHYSICAL EXAM:  Performance status (ECOG): {CHL ONC XI:[id number]}  There were no vitals filed for this visit. Wt Readings from Last 3 Encounters:  04/19/21 221 lb (100.2 kg)  04/09/21 219 lb 9.6 oz (99.6 kg)  03/08/21 222 lb (100.7 kg)   Physical Exam   LABORATORY DATA:  I have reviewed the labs as listed.  CBC Latest Ref Rng & Units 05/06/2021 12/12/2020 12/11/2020  WBC 4.0 - 10.5 K/uL 13.9(H) 19.8(H) 15.0(H)  Hemoglobin 13.0 - 17.0 g/dL 15.8 15.0 14.3  Hematocrit 39.0 - 52.0 % 46.3 44.1 43.0  Platelets 150 - 400 K/uL 171 176 142(L)   CMP Latest Ref Rng & Units 05/06/2021 12/12/2020 12/11/2020  Glucose 70 - 99 mg/dL 206(H) 300(H) 367(H)  BUN 8 - 23 mg/dL 13 25(H) 18  Creatinine 0.61 - 1.24 mg/dL 0.91 0.83 0.90  Sodium 135 - 145 mmol/L 133(L) 135 130(L)  Potassium 3.5 - 5.1 mmol/L 3.3(L) 4.0 3.7  Chloride 98 - 111 mmol/L 97(L) 103 97(L)  CO2 22 - 32 mmol/L 26 23 24   Calcium 8.9 - 10.3 mg/dL 9.1 9.1 8.9  Total Protein 6.5 - 8.1 g/dL 7.5 - 6.7   Total Bilirubin 0.3 - 1.2 mg/dL 0.8 - 0.6  Alkaline Phos 38 - 126 U/L 91 - 96  AST 15 - 41 U/L 15 - 19  ALT 0 - 44 U/L 17 - 23    DIAGNOSTIC IMAGING:  I have independently reviewed the scans and discussed with the patient. No results found.   ASSESSMENT:  ***   PLAN:  ***   Orders placed this encounter:  No orders of the defined types were placed in this encounter.    Derek Jack, MD [facility] [phone]   I, ***, am acting as a scribe for Dr. Derek Jack.  {Add Barista Statement}

## 2021-05-06 NOTE — Patient Instructions (Signed)

## 2021-05-06 NOTE — Progress Notes (Signed)
Chief Complaint  Patient presents with   Shoulder Pain    R/ hurting and requesting a injection.     Procedure note the subacromial injection shoulder RIGHT    Verbal consent was obtained to inject the  RIGHT   Shoulder  Timeout was completed to confirm the injection site is a subacromial space of the  RIGHT  shoulder   Medication used Depo-Medrol 40 mg and lidocaine 1% 3 cc  Anesthesia was provided by ethyl chloride  The injection was performed in the RIGHT  posterior subacromial space. After pinning the skin with alcohol and anesthetized the skin with ethyl chloride the subacromial space was injected using a 20-gauge needle. There were no complications  Sterile dressing was applied.

## 2021-05-07 ENCOUNTER — Ambulatory Visit (HOSPITAL_BASED_OUTPATIENT_CLINIC_OR_DEPARTMENT_OTHER): Payer: Medicare HMO | Admitting: Hematology

## 2021-05-07 DIAGNOSIS — C3491 Malignant neoplasm of unspecified part of right bronchus or lung: Secondary | ICD-10-CM

## 2021-05-07 NOTE — Patient Instructions (Incomplete)
1. Asthma/COPD overlap - with acute exacerbation  - Continue with your Breztri two puffs twice daily in addition to the oxygen at night. - Continue Xolair injections every 2 weeks - May use albuterol 2 puffs every 4 hours as needed for cough, wheeze, tightness in chest, shortness of breath OR may use albuterol via nebulizer 1 ampoule every 4 hours as needed for cough, wheeze, tightness in chest, or shortness of breath. Asthma control goals:  Full participation in all desired activities (may need albuterol before activity) Albuterol use two time or less a week on average (not counting use with activity) Cough interfering with sleep two time or less a month Oral steroids no more than once a year No hospitalizations  2. Seasonal and perennial allergic rhinitis - May use an antihistamine such as Claritin, Allegra, Xyzal, or Zyrtec once a day as needed for runny nose  3. Follow up as

## 2021-05-08 ENCOUNTER — Ambulatory Visit: Payer: Medicare HMO | Admitting: Family

## 2021-05-15 ENCOUNTER — Ambulatory Visit (INDEPENDENT_AMBULATORY_CARE_PROVIDER_SITE_OTHER): Payer: Medicare HMO | Admitting: *Deleted

## 2021-05-15 ENCOUNTER — Other Ambulatory Visit: Payer: Self-pay

## 2021-05-15 DIAGNOSIS — J4540 Moderate persistent asthma, uncomplicated: Secondary | ICD-10-CM

## 2021-05-29 ENCOUNTER — Ambulatory Visit (INDEPENDENT_AMBULATORY_CARE_PROVIDER_SITE_OTHER): Payer: Medicare HMO

## 2021-05-29 ENCOUNTER — Other Ambulatory Visit: Payer: Self-pay

## 2021-05-29 DIAGNOSIS — J4540 Moderate persistent asthma, uncomplicated: Secondary | ICD-10-CM | POA: Diagnosis not present

## 2021-06-10 ENCOUNTER — Encounter: Payer: Self-pay | Admitting: Family Medicine

## 2021-06-10 ENCOUNTER — Other Ambulatory Visit: Payer: Self-pay

## 2021-06-10 ENCOUNTER — Ambulatory Visit: Payer: Medicare HMO | Admitting: Family Medicine

## 2021-06-10 ENCOUNTER — Telehealth: Payer: Self-pay | Admitting: *Deleted

## 2021-06-10 ENCOUNTER — Ambulatory Visit (HOSPITAL_COMMUNITY)
Admission: RE | Admit: 2021-06-10 | Discharge: 2021-06-10 | Disposition: A | Discharge status: Home / Self Care | Payer: Medicare HMO | Source: Ambulatory Visit | Attending: Family Medicine | Admitting: Family Medicine

## 2021-06-10 VITALS — BP 126/82 | HR 95 | Temp 98.2°F | Resp 18

## 2021-06-10 DIAGNOSIS — Z720 Tobacco use: Secondary | ICD-10-CM | POA: Diagnosis not present

## 2021-06-10 DIAGNOSIS — J441 Chronic obstructive pulmonary disease with (acute) exacerbation: Secondary | ICD-10-CM

## 2021-06-10 DIAGNOSIS — R0602 Shortness of breath: Secondary | ICD-10-CM | POA: Diagnosis not present

## 2021-06-10 DIAGNOSIS — J45901 Unspecified asthma with (acute) exacerbation: Secondary | ICD-10-CM | POA: Diagnosis not present

## 2021-06-10 DIAGNOSIS — J302 Other seasonal allergic rhinitis: Secondary | ICD-10-CM

## 2021-06-10 DIAGNOSIS — J449 Chronic obstructive pulmonary disease, unspecified: Secondary | ICD-10-CM | POA: Diagnosis not present

## 2021-06-10 DIAGNOSIS — F17200 Nicotine dependence, unspecified, uncomplicated: Secondary | ICD-10-CM | POA: Diagnosis not present

## 2021-06-10 DIAGNOSIS — J3089 Other allergic rhinitis: Secondary | ICD-10-CM

## 2021-06-10 DIAGNOSIS — R059 Cough, unspecified: Secondary | ICD-10-CM | POA: Insufficient documentation

## 2021-06-10 MED ORDER — AZITHROMYCIN 250 MG PO TABS: ORAL_TABLET | ORAL | 0 refills | Status: DC

## 2021-06-10 MED ORDER — PREDNISONE 10 MG PO TABS: ORAL_TABLET | ORAL | 0 refills | Status: DC

## 2021-06-10 NOTE — Patient Instructions (Addendum)
Asthma with acute exacerbation Get a chest x-ray.  We will call you with results as soon as they become available.   Begin prednisone 10 mg tablets. Take 2 tablets twice a day for 3 days, then take 2 tablets once a day for 1 day, then take 1 tablet on the 5th day, then stop  Begin azithromycin 250 mg tablets. Take 2 tablets on the first day, then take 1 tablet once a day for the next 4 days Increase Breztri to 2 puffs twice a day with a spacer to prevent cough or wheeze Continue albuterol 2 puffs once every 4 hours as needed for cough or wheeze You may use albuterol 2 puffs 5 to 15 minutes before activity to decrease cough or wheeze  Allergic rhinitis Continue Flonase 2 sprays in each nostril once a day as needed for stuffy nose Consider saline nasal rinses as needed for nasal symptoms. Use this before any medicated nasal sprays for best result Continue Mucinex [phone] mg twice a day and increase fluid intake in order to thin mucus  Tobacco use Continue to try to cut down smoking and better to quit  Call the clinic if this treatment plan is not working well for you  Follow up in 1 week or sooner if needed.

## 2021-06-10 NOTE — Telephone Encounter (Signed)
Called and advised patient's wife of his Chest X Ray results per Gareth Morgan instruction. I did advise that Webb Silversmith had sent in antibiotics and prednisone to [facility] family pharmacy. Patients wife was wondering if he could have something sent in for his cough. Will you please advise Webb Silversmith?

## 2021-06-10 NOTE — Progress Notes (Signed)
[address] [place] [address] Dept: [phone]  FOLLOW UP NOTE  Patient ID: Kevin Frank, male    DOB: 07/25/1940  Age: 80 y.o. MRN: [id number] Date of Office Visit: 06/10/2021  Assessment  Chief Complaint: Nasal Congestion and Cough  HPI Kevin Frank is an 80 year old male who presents the clinic for evaluation of acute cough.  He was last seen in this clinic on 04/19/2021 by Dr. Ernst Bowler for evaluation of asthma/COPD with acute exacerbation requiring prednisone and Levaquin for resolution of symptoms.  He is accompanied by his wife who assists with history.  At today's visit, he reports that he has been experiencing cough producing thick tan secretions and head congestion that began about 3 weeks ago.  He denies fever, sweats, chills, or sick contacts. He continues Breztri 2 puffs once a day and occasionally twice a day and uses albuterol infrequently. He continues Xolair injections once every 2 weeks with no large or local reactions. He reports a decrease of asthma symptoms while continuing on Xolair. Allergic rhinitis is reported as moderately well controlled with nasal congestion and post nasal drainage as the main symptoms. He is not currently using an antihistamine, nasal saline rinses, nasal steroid spray, or Mucinex. His current medications are lsited in the chart.    Drug Allergies:  Allergies  Allergen Reactions   No Known Allergies     Physical Exam: BP 126/82 (BP Location: Left Arm, Patient Position: Sitting, Cuff Size: Normal)    Pulse 95    Temp 98.2 F (36.8 C) (Temporal)    Resp 18    SpO2 91%    Physical Exam Vitals reviewed.  Constitutional:      Appearance: Normal appearance.  HENT:     Head: Normocephalic and atraumatic.     Right Ear: Tympanic membrane normal.     Left Ear: Tympanic membrane normal.     Nose:     Comments: Bilateral nares slightly erythematous with clear nasal drainage noted. Pharynx slightly erythematous with  no exudate noted. Ears normal. Eyes normal. Eyes:     Conjunctiva/sclera: Conjunctivae normal.  Cardiovascular:     Rate and Rhythm: Normal rate and regular rhythm.     Heart sounds: Normal heart sounds. No murmur heard. Pulmonary:     Effort: Pulmonary effort is normal.     Comments: Scattered rhonchi which cleared with cough Musculoskeletal:        General: Normal range of motion.     Cervical back: Normal range of motion and neck supple.  Skin:    General: Skin is warm and dry.  Neurological:     Mental Status: He is alert and oriented to person, place, and time.  Psychiatric:        Mood and Affect: Mood normal.        Behavior: Behavior normal.        Thought Content: Thought content normal.        Judgment: Judgment normal.    Diagnostics: Spirometry deferred due to cough  Assessment and Plan: 1. Cough, unspecified type   2. Asthma with COPD with exacerbation ([place])   3. Current smoker   4. Seasonal and perennial allergic rhinitis   5. Tobacco use     Meds ordered this encounter  Medications   predniSONE (DELTASONE) 10 MG tablet    Sig: Begin prednisone 10 mg tablets. Take 2 tablets twice a day for 3 days, then take 2 tablets once a day for 1 day,  then take 1 tablet on the 5th day, then stop    Dispense:  15 tablet    Refill:  0   azithromycin (ZITHROMAX) 250 MG tablet    Sig: Take 2 tablets on the first day, then take 1 tablet once a day for the next 4 days, then stop    Dispense:  6 each    Refill:  0    Patient Instructions  Asthma with acute exacerbation Get a chest x-ray.  We will call you with results as soon as they become available.   Begin prednisone 10 mg tablets. Take 2 tablets twice a day for 3 days, then take 2 tablets once a day for 1 day, then take 1 tablet on the 5th day, then stop  Begin azithromycin 250 mg tablets. Take 2 tablets on the first day, then take 1 tablet once a day for the next 4 days Increase Breztri to 2 puffs twice a day with a  spacer to prevent cough or wheeze Continue albuterol 2 puffs once every 4 hours as needed for cough or wheeze You may use albuterol 2 puffs 5 to 15 minutes before activity to decrease cough or wheeze  Allergic rhinitis Continue Flonase 2 sprays in each nostril once a day as needed for stuffy nose Consider saline nasal rinses as needed for nasal symptoms. Use this before any medicated nasal sprays for best result Continue Mucinex [phone] mg twice a day and increase fluid intake in order to thin mucus  Tobacco use Continue to try to cut down smoking and better to quit  Call the clinic if this treatment plan is not working well for you  Follow up in 1 week or sooner if needed.   Return in about 1 week (around 06/17/2021), or if symptoms worsen or fail to improve.    Thank you for the opportunity to care for this patient.  Please do not hesitate to contact me with questions.  Gareth Morgan, FNP [facility] [place]

## 2021-06-11 ENCOUNTER — Telehealth: Payer: Self-pay

## 2021-06-11 NOTE — Telephone Encounter (Signed)
Spoke with patients wife, informed her of Anne's note. Wife verbalized understanding and did a verbal read back to be sure.

## 2021-06-11 NOTE — Telephone Encounter (Signed)
Spoke with patient and his wife, I was informed patient's breathing is a little better. Wife informed me that medications will be delivered this evening.

## 2021-06-11 NOTE — Telephone Encounter (Signed)
Thank you :)

## 2021-06-11 NOTE — Telephone Encounter (Signed)
-----   Message from Dara Hoyer, FNP sent at 06/10/2021  6:02 PM EST ----- Can you please check on this patient's breathing? Thank you

## 2021-06-11 NOTE — Telephone Encounter (Signed)
Can you please have him begin Mucinex [phone] mg twice a day and he can try Delsym cough syrup for some relief of cough. Prednisone will open him up some and Mucinex will help to make the mucus thin so he can get it coughed out. Azithromycin will help to decrease the inflammation. If he is taking an antihistamine, please have him stop that for the next few days. Please have him try to cut down on smoking for some added relief of cough and thick secretions. Thank you.

## 2021-06-11 NOTE — Progress Notes (Signed)
Can you please let this patient know that his cxr does not indicate any infection. Please have him continue the azithromycin and prednisone as ordered from yesterday. Thank you

## 2021-06-11 NOTE — Telephone Encounter (Signed)
Patients wife called back to see if any cough syrup will be sent in for the patient and if he is to do the mucinex along with medications the patient was put on last night?  Please [place]

## 2021-06-12 ENCOUNTER — Ambulatory Visit (HOSPITAL_COMMUNITY)
Admission: RE | Admit: 2021-06-12 | Discharge: 2021-06-12 | Disposition: A | Discharge status: Home / Self Care | Payer: Medicare HMO | Source: Ambulatory Visit | Attending: Hematology | Admitting: Hematology

## 2021-06-12 ENCOUNTER — Other Ambulatory Visit: Payer: Self-pay

## 2021-06-12 ENCOUNTER — Ambulatory Visit (INDEPENDENT_AMBULATORY_CARE_PROVIDER_SITE_OTHER): Payer: Medicare HMO

## 2021-06-12 DIAGNOSIS — C3491 Malignant neoplasm of unspecified part of right bronchus or lung: Secondary | ICD-10-CM | POA: Diagnosis not present

## 2021-06-12 DIAGNOSIS — J4540 Moderate persistent asthma, uncomplicated: Secondary | ICD-10-CM

## 2021-06-12 DIAGNOSIS — C3490 Malignant neoplasm of unspecified part of unspecified bronchus or lung: Secondary | ICD-10-CM | POA: Diagnosis not present

## 2021-06-12 DIAGNOSIS — I700 Atherosclerosis of aorta: Secondary | ICD-10-CM | POA: Diagnosis not present

## 2021-06-12 DIAGNOSIS — R911 Solitary pulmonary nodule: Secondary | ICD-10-CM | POA: Diagnosis not present

## 2021-06-12 DIAGNOSIS — J439 Emphysema, unspecified: Secondary | ICD-10-CM | POA: Diagnosis not present

## 2021-06-12 LAB — POCT I-STAT CREATININE: Creatinine, Ser: 1.1 mg/dL (ref 0.61–1.24)

## 2021-06-12 MED ORDER — IOHEXOL 300 MG/ML  SOLN
80.0000 mL | Freq: Once | INTRAMUSCULAR | Status: AC | PRN
  Administered 2021-06-12: 80 mL via INTRAVENOUS

## 2021-06-17 NOTE — Progress Notes (Signed)
[place], [place] [address]   CLINIC:  Medical Oncology/Hematology  PCP:  Sharilyn Sites, MD [address] 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  PRIOR THERAPY: SBRT 54 Gy in 3 fractions to right lung from 05/05/2018 to 05/12/2018  NGS Results: not done  CURRENT THERAPY: surveillance  BRIEF ONCOLOGIC HISTORY:  Oncology History  Squamous cell carcinoma of right lung ([facility])  03/29/2018 Initial Diagnosis   Squamous cell carcinoma of right lung ([place])   03/29/2018 Cancer Staging   Staging form: Lung, AJCC 8th Edition - Clinical stage from 03/29/2018: Stage IA3 (cT1c, cN0, cM0) - Signed by Derek Jack, MD on 03/29/2018      CANCER STAGING:  Cancer Staging  Squamous cell carcinoma of right lung ([facility]) Staging form: Lung, AJCC 8th Edition - Clinical stage from 03/29/2018: Stage IA3 (cT1c, cN0, cM0) - Signed by Derek Jack, MD on 03/29/2018   INTERVAL HISTORY:  [name], a 80 y.o. male, returns for routine follow-up of his  squamous cell carcinoma of the right lung. [place] was last seen on 10/31/2020.   Today he reports feeling well. He reports fatigue. He smokes 2-3 ppd. His weight is stable.  REVIEW OF SYSTEMS:  Review of Systems  Constitutional:  Negative for appetite change and fatigue (50%).  Respiratory:  Positive for cough and shortness of breath.   Genitourinary:         Occasional dark urine  Psychiatric/Behavioral:  Positive for sleep disturbance.   All other systems reviewed and are negative.  PAST MEDICAL/SURGICAL HISTORY:  Past Medical History:  Diagnosis Date   AAA (abdominal aortic aneurysm) 01/2008   3.2cm   Allergy    Arthritis    Asthma    BPH (benign prostatic hyperplasia)    Chronic knee pain    bilateral   COPD (chronic obstructive pulmonary disease) (HCC)    Diabetes mellitus    type II   Gout     History of kidney stones    Hypercholesterolemia    Hyperlipidemia    Hypertension    Lung cancer ([facility]) 2019   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    Normal nuclear stress test 2011   Obesity    Obstructive sleep apnea on CPAP    Presence of indwelling urinary catheter    Shortness of breath    due to cigarette abuse   Tobacco abuse    Past Surgical History:  Procedure Laterality Date   ABDOMINAL AORTIC ENDOVASCULAR FENESTRATED STENT GRAFT N/A 04/09/2016   Procedure: ABDOMINAL AORTIC ENDOVASCULAR FENESTRATED STENT GRAFT with left brachial artery access using ultrasound guidance;  Surgeon: Serafina Mitchell, MD;  Location: [facility];  Service: Vascular;  Laterality: N/A;   [place]   [facility]   COLONOSCOPY  2006   2006: multiple diminutive polyps in rectum, one pedunculated polyp at 10 cm s/p snare removal. Left-sided diverticulum, pedunculated polyps at 35 cm and splenic flexure s/p snare removal. No path available.    COLONOSCOPY WITH PROPOFOL N/A 11/24/2019   Procedure: COLONOSCOPY WITH PROPOFOL;  Surgeon: Daneil Dolin, MD;  [facility];  Service: Endoscopy;  Laterality: N/A;  9:45am   PERIPHERAL VASCULAR CATHETERIZATION N/A 04/10/2016   Procedure: Renal Angiography;  Surgeon: Angelia Mould, MD;  Location: [facility];  Service: Cardiovascular;  Laterality: N/A;   POLYPECTOMY  11/24/2019   Procedure: POLYPECTOMY;  Surgeon: Daneil Dolin, MD;  [facility];  Service: Endoscopy;;   TRANSURETHRAL RESECTION OF PROSTATE  01/07/2011   Procedure: TRANSURETHRAL RESECTION OF THE PROSTATE (TURP);  Surgeon: Marissa Nestle;  Location: [facility];  Service: Urology;  Laterality: N/A;    SOCIAL HISTORY:  Social History   Socioeconomic History   Marital status: Married    Spouse name: [name]   Number of children: 2   Years of education: Not on file   Highest education level: Not on file  Occupational History   Not on file   Tobacco Use   Smoking status: Every Day    Packs/day: 3.00    Years: 50.00    Pack years: 150.00    Types: Cigarettes   Smokeless tobacco: Never   Tobacco comments:    2ppd 10/30/2016 ee  Vaping Use   Vaping Use: Never used  Substance and Sexual Activity   Alcohol use: Yes    Alcohol/week: 0.0 standard drinks    Comment: very little   Drug use: No   Sexual activity: Not on file  Other Topics Concern   Not on file  Social History Narrative   Lives at home with wife   Social Determinants of Health   Financial Resource Strain: Not on file  Food Insecurity: Not on file  Transportation Needs: Not on file  Physical Activity: Not on file  Stress: Not on file  Social Connections: Not on file  Intimate Partner Violence: Not on file    FAMILY HISTORY:  Family History  Problem Relation Age of Onset   Liver disease Father    Lymphoma Mother    Diabetes Mother    Hypertension Mother    Cancer Mother    Mental retardation Other        sibling -- from birth    Asthma Daughter    Allergic rhinitis Neg Hx    Angioedema Neg Hx    Atopy Neg Hx    Eczema Neg Hx    Immunodeficiency Neg Hx    Colon cancer Neg Hx     CURRENT MEDICATIONS:  Current Outpatient Medications  Medication Sig Dispense Refill   albuterol (VENTOLIN HFA) 108 (90 Base) MCG/ACT inhaler Use 4 puffs every 4-6 hours as needed for cough or wheeze 18 g 2   allopurinol (ZYLOPRIM) 300 MG tablet Take 1 tablet (300 mg total) by mouth daily. 30 tablet 1   amLODipine (NORVASC) 10 MG tablet Take 1 tablet (10 mg total) by mouth daily. 14 tablet 0   atorvastatin (LIPITOR) 20 MG tablet Take 1 tablet (20 mg total) by mouth daily. 90 tablet 1   azithromycin (ZITHROMAX) 250 MG tablet Take 2 tablets on the first day, then take 1 tablet once a day for the next 4 days, then stop 6 each 0   Budeson-Glycopyrrol-Formoterol (BREZTRI AEROSPHERE) 160-9-4.8 MCG/ACT AERO Inhale 2 puffs into the lungs in the morning and at bedtime. 10.7  g 5   clopidogrel (PLAVIX) 75 MG tablet Take 1 tablet (75 mg total) by mouth daily. 30 tablet 1   clotrimazole-betamethasone (LOTRISONE) cream      EPINEPHrine 0.3 mg/0.3 mL IJ SOAJ injection      hydrALAZINE (APRESOLINE) 50 MG tablet Take 1 tablet (50 mg total) by mouth in the morning and at bedtime.     JANUVIA 100 MG tablet      JARDIANCE 10 MG TABS tablet      losartan (COZAAR)  100 MG tablet TAKE 1 TABLET BY MOUTH ONCE DAILY. (Patient taking differently: Take 100 mg by mouth daily.) 30 tablet 0   metFORMIN (GLUCOPHAGE) 500 MG tablet Take 1,000 mg by mouth 2 (two) times daily with a meal.      metoprolol succinate (TOPROL-XL) 25 MG 24 hr tablet Take 1 tablet (25 mg total) by mouth daily. 14 tablet 0   predniSONE (DELTASONE) 10 MG tablet Take 2 tabs twice daily for one week, 1 tab twice daily for one week, 1 tab daily for one week, then STOP! 49 tablet 0   predniSONE (DELTASONE) 10 MG tablet Begin prednisone 10 mg tablets. Take 2 tablets twice a day for 3 days, then take 2 tablets once a day for 1 day, then take 1 tablet on the 5th day, then stop 15 tablet 0   sertraline (ZOLOFT) 50 MG tablet Take 1 tablet (50 mg total) by mouth daily. 30 tablet 1   torsemide (DEMADEX) 20 MG tablet Take 1 tablet (20 mg total) by mouth daily.     albuterol (PROVENTIL) (2.5 MG/3ML) 0.083% nebulizer solution Take 3 mLs (2.5 mg total) by nebulization every 6 (six) hours as needed for wheezing or shortness of breath. (Patient not taking: Reported on 06/18/2021) 150 mL 2   Current Facility-Administered Medications  Medication Dose Route Frequency Provider Last Rate Last Admin   levalbuterol (XOPENEX) nebulizer solution 1.25 mg  1.25 mg Nebulization Once Gean Quint, MD       omalizumab Arvid Right) prefilled syringe 375 mg  375 mg Subcutaneous Q14 Days Valentina Shaggy, MD   375 mg at 06/12/21 1143    ALLERGIES:  Allergies  Allergen Reactions   No Known Allergies     PHYSICAL EXAM:  Performance status  (ECOG): 1 - Symptomatic but completely ambulatory  There were no vitals filed for this visit. Wt Readings from Last 3 Encounters:  04/19/21 221 lb (100.2 kg)  04/09/21 219 lb 9.6 oz (99.6 kg)  03/08/21 222 lb (100.7 kg)   Physical Exam Vitals reviewed.  Constitutional:      Appearance: Normal appearance.  Cardiovascular:     Rate and Rhythm: Normal rate and regular rhythm.     Pulses: Normal pulses.     Heart sounds: Normal heart sounds.  Pulmonary:     Effort: Pulmonary effort is normal.     Breath sounds: Normal breath sounds.  Neurological:     General: No focal deficit present.     Mental Status: He is alert and oriented to person, place, and time.  Psychiatric:        Mood and Affect: Mood normal.        Behavior: Behavior normal.     LABORATORY DATA:  I have reviewed the labs as listed.  CBC Latest Ref Rng & Units 05/06/2021 12/12/2020 12/11/2020  WBC 4.0 - 10.5 K/uL 13.9(H) 19.8(H) 15.0(H)  Hemoglobin 13.0 - 17.0 g/dL 15.8 15.0 14.3  Hematocrit 39.0 - 52.0 % 46.3 44.1 43.0  Platelets 150 - 400 K/uL 171 176 142(L)   CMP Latest Ref Rng & Units 06/12/2021 05/06/2021 12/12/2020  Glucose 70 - 99 mg/dL - 206(H) 300(H)  BUN 8 - 23 mg/dL - 13 25(H)  Creatinine 0.61 - 1.24 mg/dL 1.10 0.91 0.83  Sodium 135 - 145 mmol/L - 133(L) 135  Potassium 3.5 - 5.1 mmol/L - 3.3(L) 4.0  Chloride 98 - 111 mmol/L - 97(L) 103  CO2 22 - 32 mmol/L - 26 23  Calcium 8.9 -  10.3 mg/dL - 9.1 9.1  Total Protein 6.5 - 8.1 g/dL - 7.5 -  Total Bilirubin 0.3 - 1.2 mg/dL - 0.8 -  Alkaline Phos 38 - 126 U/L - 91 -  AST 15 - 41 U/L - 15 -  ALT 0 - 44 U/L - 17 -    DIAGNOSTIC IMAGING:  I have independently reviewed the scans and discussed with the patient. DG Chest 2 View  Result Date: 06/10/2021 CLINICAL DATA:  Cough and shortness of breath EXAM: CHEST - 2 VIEW COMPARISON:  01/29/2021 FINDINGS: Cardiac shadow is within normal limits. Aortic calcifications are again seen. Lungs are hyperinflated  bilaterally. Mild interstitial changes are noted stable from the prior exam. Scarring in the left base is seen. No acute bony abnormality is noted. IMPRESSION: COPD without acute abnormality. Electronically Signed   By: Inez Catalina M.D.   On: 06/10/2021 17:46   CT Chest W Contrast  Result Date: 06/13/2021 CLINICAL DATA:  Right upper lobe non-small cell lung cancer, status post SBRT EXAM: CT CHEST WITH CONTRAST TECHNIQUE: Multidetector CT imaging of the chest was performed during intravenous contrast administration. CONTRAST:  46mL OMNIPAQUE IOHEXOL 300 MG/ML  SOLN COMPARISON:  12/11/2020, 10/24/2020 FINDINGS: Cardiovascular: Aortic atherosclerosis. Cardiomegaly. Three-vessel coronary artery calcifications. No pericardial effusion. Mediastinum/Nodes: Unchanged prominent pretracheal lymph node measuring 2.7 x 1.3 cm (series 2, image 66). No other enlarged mediastinal or hilar lymph nodes. Thyroid gland, trachea, and esophagus demonstrate no significant findings. Lungs/Pleura: Moderate to severe centrilobular emphysema and diffuse bilateral bronchial wall thickening. Unchanged, predominantly bandlike appearance of a nodule of the posterior medial right upper lobe measuring 2.0 x 0.9 cm (series 4, image 38). There has been interval resolution of previously seen heterogeneous airspace opacity most conspicuously seen in the left upper lobe on prior examination. No pleural effusion or pneumothorax. Upper Abdomen: No acute abnormality. Partially imaged aortic stent endograft. Status post cholecystectomy. Musculoskeletal: No chest wall mass or suspicious bone lesions identified. IMPRESSION: 1. Unchanged, predominantly bandlike appearance of a treated nodule of the posterior medial right upper lobe status post SBRT. 2. No evidence of recurrent or metastatic disease in the chest. 3. Unchanged prominent pretracheal lymph node, not previously PET avid and reactive. 4. Interval resolution of previously seen heterogeneous  airspace opacity. 5. Emphysema. 6. Coronary artery disease. Aortic Atherosclerosis (ICD10-I70.0) and Emphysema (ICD10-J43.9). Electronically Signed   By: Delanna Ahmadi M.D.   On: 06/13/2021 08:41     ASSESSMENT:  1.  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 -He was felt not to be a surgical candidate. -SBRT to the right lung lesion from 05/05/2018 through 05/12/2018, 54 Gray in 3 fractions of 18 [place]. -He is continuing to smoke 2 packs of cigarettes per day. -CT chest on 04/20/2020 shows unchanged posttreatment appearance of irregular nodule of the posterior right upper lobe measuring 2.1 x 0.8 cm.  Stable small pulmonary nodules.  Unchanged prominent pretracheal lymph node measuring 2.4 x 0.9 cm.   PLAN:  1.  Stage I (T1CN0 M0) squamous cell carcinoma right upper lobe of the lung: - He is continuing to smoke about 2 to 3 packs of cigarettes per day. - Shortness of breath on exertion is stable and at baseline.  He is continuing to use oxygen at nighttime. - Chronic cough is also stable.  No hemoptysis. - Reviewed CT of the chest with contrast from 06/13/2021 which showed unchanged predominantly bandlike appearance  of treated nodule of the posterior medial right upper lobe, status post SBRT with no evidence of recurrence or metastatic disease.  Unchanged prominent pretracheal lymph node, not PET avid. - We have also reviewed labs which showed mildly elevated white count 13.9.  He was reportedly treated with steroids for bronchitis prior to the labs were done.  Hence his sugar was also elevated.  LFTs were normal. - Recommend follow-up in 6 months with repeat CT scan of the chest and labs.   Orders placed this encounter:  No orders of the defined types were placed in this encounter.    Derek Jack, MD [place] [phone]   I, Thana Ates, am acting as a  scribe for Dr. Derek Jack.  I, Derek Jack MD, have reviewed the above documentation for accuracy and completeness, and I agree with the above.

## 2021-06-18 ENCOUNTER — Other Ambulatory Visit: Payer: Self-pay

## 2021-06-18 ENCOUNTER — Inpatient Hospital Stay (HOSPITAL_COMMUNITY): Payer: Medicare HMO | Attending: Hematology | Admitting: Hematology

## 2021-06-18 VITALS — BP 142/77 | HR 62 | Temp 98.2°F | Resp 18 | Ht 70.0 in | Wt 220.0 lb

## 2021-06-18 DIAGNOSIS — C3490 Malignant neoplasm of unspecified part of unspecified bronchus or lung: Secondary | ICD-10-CM

## 2021-06-18 DIAGNOSIS — C3411 Malignant neoplasm of upper lobe, right bronchus or lung: Secondary | ICD-10-CM | POA: Diagnosis not present

## 2021-06-18 DIAGNOSIS — F17210 Nicotine dependence, cigarettes, uncomplicated: Secondary | ICD-10-CM | POA: Diagnosis not present

## 2021-06-18 DIAGNOSIS — C3491 Malignant neoplasm of unspecified part of right bronchus or lung: Secondary | ICD-10-CM | POA: Diagnosis not present

## 2021-06-18 NOTE — Patient Instructions (Signed)
[place] at [facility] Discharge Instructions  You were seen and examined today by Dr. Delton Coombes. He reviewed your most recent labs and CT scan and everything looks good. Please keep follow up appointment in 6 months.   Thank you for choosing [place] at [facility] to provide your oncology and hematology care.  To afford each patient quality time with our provider, please arrive at least 15 minutes before your scheduled appointment time.   If you have a lab appointment with the [place] [facility] [place] please come in thru the Main Entrance and check in at the main information desk.  You need to re-schedule your appointment should you arrive 10 or more minutes late.  We strive to give you quality time with our providers, and arriving late affects you and other patients whose appointments are after yours.  Also, if you no show three or more times for appointments you may be dismissed from the clinic at the providers discretion.     Again, thank you for choosing [facility].  Our hope is that these requests will decrease the amount of time that you wait before being seen by our physicians.       _____________________________________________________________  Should you have questions after your visit to [facility], please contact our office at [phone] and follow the prompts.  Our office hours are 8:00 a.m. and 4:30 p.m. Monday - Friday.  Please note that voicemails left after 4:00 p.m. may not be returned until the following business day.  We are closed weekends and major holidays.  You do have access to a nurse 24-7, just call the main number to the clinic [phone] and do not press any options, hold on the line and a nurse will answer the phone.    For prescription refill requests, have your pharmacy contact our office and allow 72 hours.    Due to Covid, you will need to wear a mask upon entering the hospital. If you do  not have a mask, a mask will be given to you at the Main Entrance upon arrival. For doctor visits, patients may have 1 support person age 32 or older with them. For treatment visits, patients can not have anyone with them due to social distancing guidelines and our immunocompromised population.

## 2021-06-19 ENCOUNTER — Ambulatory Visit (HOSPITAL_COMMUNITY): Payer: Medicare HMO | Admitting: Hematology

## 2021-06-28 ENCOUNTER — Ambulatory Visit (INDEPENDENT_AMBULATORY_CARE_PROVIDER_SITE_OTHER): Payer: Medicare HMO

## 2021-06-28 ENCOUNTER — Other Ambulatory Visit: Payer: Self-pay

## 2021-06-28 DIAGNOSIS — J4540 Moderate persistent asthma, uncomplicated: Secondary | ICD-10-CM | POA: Diagnosis not present

## 2021-07-08 DIAGNOSIS — C3491 Malignant neoplasm of unspecified part of right bronchus or lung: Secondary | ICD-10-CM | POA: Diagnosis not present

## 2021-07-08 DIAGNOSIS — I2510 Atherosclerotic heart disease of native coronary artery without angina pectoris: Secondary | ICD-10-CM | POA: Diagnosis not present

## 2021-07-08 DIAGNOSIS — E6609 Other obesity due to excess calories: Secondary | ICD-10-CM | POA: Diagnosis not present

## 2021-07-08 DIAGNOSIS — I10 Essential (primary) hypertension: Secondary | ICD-10-CM | POA: Diagnosis not present

## 2021-07-08 DIAGNOSIS — E119 Type 2 diabetes mellitus without complications: Secondary | ICD-10-CM | POA: Diagnosis not present

## 2021-07-08 DIAGNOSIS — Z125 Encounter for screening for malignant neoplasm of prostate: Secondary | ICD-10-CM | POA: Diagnosis not present

## 2021-07-08 DIAGNOSIS — M1991 Primary osteoarthritis, unspecified site: Secondary | ICD-10-CM | POA: Diagnosis not present

## 2021-07-08 DIAGNOSIS — Z13220 Encounter for screening for lipoid disorders: Secondary | ICD-10-CM | POA: Diagnosis not present

## 2021-07-08 DIAGNOSIS — J449 Chronic obstructive pulmonary disease, unspecified: Secondary | ICD-10-CM | POA: Diagnosis not present

## 2021-07-08 DIAGNOSIS — I700 Atherosclerosis of aorta: Secondary | ICD-10-CM | POA: Diagnosis not present

## 2021-07-08 DIAGNOSIS — Z6832 Body mass index (BMI) 32.0-32.9, adult: Secondary | ICD-10-CM | POA: Diagnosis not present

## 2021-07-08 DIAGNOSIS — Z1331 Encounter for screening for depression: Secondary | ICD-10-CM | POA: Diagnosis not present

## 2021-07-09 DIAGNOSIS — E11319 Type 2 diabetes mellitus with unspecified diabetic retinopathy without macular edema: Secondary | ICD-10-CM | POA: Diagnosis not present

## 2021-07-12 ENCOUNTER — Other Ambulatory Visit: Payer: Self-pay

## 2021-07-12 ENCOUNTER — Ambulatory Visit (INDEPENDENT_AMBULATORY_CARE_PROVIDER_SITE_OTHER): Payer: Medicare HMO

## 2021-07-12 DIAGNOSIS — J4540 Moderate persistent asthma, uncomplicated: Secondary | ICD-10-CM

## 2021-07-17 ENCOUNTER — Encounter: Payer: Self-pay | Admitting: Podiatry

## 2021-07-17 ENCOUNTER — Ambulatory Visit: Payer: Medicare HMO | Admitting: Podiatry

## 2021-07-17 ENCOUNTER — Other Ambulatory Visit: Payer: Self-pay

## 2021-07-17 DIAGNOSIS — B351 Tinea unguium: Secondary | ICD-10-CM

## 2021-07-17 DIAGNOSIS — E1165 Type 2 diabetes mellitus with hyperglycemia: Secondary | ICD-10-CM

## 2021-07-17 DIAGNOSIS — M79674 Pain in right toe(s): Secondary | ICD-10-CM | POA: Diagnosis not present

## 2021-07-17 DIAGNOSIS — M79675 Pain in left toe(s): Secondary | ICD-10-CM

## 2021-07-17 NOTE — Progress Notes (Signed)
°  Subjective:  Patient ID: Kevin Frank, male    DOB: 1940/08/01,   MRN: [id number]  Chief Complaint  Patient presents with   Foot Problem    Pt is here for [name]    81 y.o. male presents for concern of thickened elongated and painful nails that are difficult to trim. Requesting to have them trimmed today. Denies any burning or tingling in her feet. Patient is diabetic and last A1c was 8.1.   . Denies any other pedal complaints. Denies n/v/f/c.   PCP: Kevin Sites MD   Past Medical History:  Diagnosis Date   AAA (abdominal aortic aneurysm) 01/2008   3.2cm   Allergy    Arthritis    Asthma    BPH (benign prostatic hyperplasia)    Chronic knee pain    bilateral   COPD (chronic obstructive pulmonary disease) ([place])    Diabetes mellitus    type II   Gout    History of kidney stones    Hypercholesterolemia    Hyperlipidemia    Hypertension    Lung cancer ([place]) 2019   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    Normal nuclear stress test 2011   Obesity    Obstructive sleep apnea on CPAP    Presence of indwelling urinary catheter    Shortness of breath    due to cigarette abuse   Tobacco abuse     Objective:  Physical Exam: Vascular: DP/PT pulses 2/4 bilateral. CFT <3 seconds. Normal hair growth on digits. No edema.  Skin. No lacerations or abrasions bilateral feet. Nails 1-5 are thickened discolored and elongated with subungual debris. Xerosis noted to bilateral plantar feet.  Musculoskeletal: MMT 5/5 bilateral lower extremities in DF, PF, Inversion and Eversion. Deceased ROM in DF of ankle joint.  Neurological: Sensation intact to light touch. Protective sensation diminished.   Assessment:   1. Uncontrolled type 2 diabetes mellitus with hyperglycemia (HCC)   2. Pain due to onychomycosis of toenails of both feet      Plan:  Patient was evaluated and treated and all questions answered. -Discussed and educated patient on diabetic foot care, especially  with  regards to the vascular, neurological and musculoskeletal systems.  -Stressed the importance of good glycemic control and the detriment of not  controlling glucose levels in relation to the foot. -Discussed supportive shoes at all times and checking feet regularly.  -Mechanically debrided all nails 1-5 bilateral using sterile nail nipper and filed with dremel without incident  -Answered all patient questions -Patient to return  in 3 months for at risk foot care -Patient advised to call the office if any problems or questions arise in the meantime.   Lorenda Peck, DPM

## 2021-07-18 ENCOUNTER — Ambulatory Visit: Payer: Medicare HMO | Admitting: Urology

## 2021-07-18 ENCOUNTER — Encounter: Payer: Self-pay | Admitting: Family Medicine

## 2021-07-18 ENCOUNTER — Encounter: Payer: Self-pay | Admitting: Urology

## 2021-07-18 VITALS — BP 183/74 | HR 92

## 2021-07-18 DIAGNOSIS — J441 Chronic obstructive pulmonary disease with (acute) exacerbation: Secondary | ICD-10-CM | POA: Insufficient documentation

## 2021-07-18 DIAGNOSIS — R339 Retention of urine, unspecified: Secondary | ICD-10-CM

## 2021-07-18 DIAGNOSIS — R059 Cough, unspecified: Secondary | ICD-10-CM | POA: Insufficient documentation

## 2021-07-18 DIAGNOSIS — R972 Elevated prostate specific antigen [PSA]: Secondary | ICD-10-CM

## 2021-07-18 DIAGNOSIS — J45901 Unspecified asthma with (acute) exacerbation: Secondary | ICD-10-CM | POA: Insufficient documentation

## 2021-07-18 LAB — URINALYSIS, ROUTINE W REFLEX MICROSCOPIC
Bilirubin, UA: NEGATIVE
Glucose, UA: NEGATIVE
Ketones, UA: NEGATIVE
Leukocytes,UA: NEGATIVE
Nitrite, UA: NEGATIVE
Specific Gravity, UA: 1.015 (ref 1.005–1.030)
Urobilinogen, Ur: 1 mg/dL (ref 0.2–1.0)
pH, UA: 6.5 (ref 5.0–7.5)

## 2021-07-18 LAB — MICROSCOPIC EXAMINATION
Bacteria, UA: NONE SEEN
Renal Epithel, UA: NONE SEEN /hpf
WBC, UA: NONE SEEN /hpf (ref 0–5)

## 2021-07-18 LAB — BLADDER SCAN AMB NON-IMAGING: Scan Result: 259

## 2021-07-18 NOTE — Progress Notes (Signed)
post void residual=259 Urological Symptom Review  Patient is experiencing the following symptoms: none   Review of Systems  Gastrointestinal (upper)  : Negative for upper GI symptoms  Gastrointestinal (lower) : Negative for lower GI symptoms  Constitutional : Negative for symptoms  Skin: Negative for skin symptoms  Eyes: Negative for eye symptoms  Ear/Nose/Throat : Negative for Ear/Nose/Throat symptoms  Hematologic/Lymphatic: Negative for Hematologic/Lymphatic symptoms  Cardiovascular : Negative for cardiovascular symptoms  Respiratory : Negative for respiratory symptoms  Endocrine: Negative for endocrine symptoms  Musculoskeletal: Negative for musculoskeletal symptoms  Neurological: Negative for neurological symptoms  Psychologic: Negative for psychiatric symptoms

## 2021-07-18 NOTE — Progress Notes (Signed)
Assessment: 1. Elevated PSA   2. Incomplete bladder emptying      Plan: The patient's outside records were reviewed including recent laboratory results.  I discussed the recent PSA results.  I advised him that his PSA is normal for his age.  I also discussed the recommendations for prostate cancer screening and that typically screening is not recommended after the age of 26. He does not have any significant urinary symptoms; however, his bladder scan did indicate incomplete emptying. Recommend timed and double voiding to ensure adequate bladder emptying. Return to office in 6 weeks with bladder scan.  Chief Complaint:  Chief Complaint  Patient presents with   Elevated PSA    History of Present Illness:  Kevin Frank is a 81 y.o. year old male who is seen in consultation from Sharilyn Sites, MD for evaluation of elevated PSA. The pt has no h/o prostate CA and reports no urinary c/o. He did have TURP in 2012. He denies frequency, urgency, incomplete emptying, weakness of stream, straining. No hematuria. No h/o stones. Occasional intermittency. No nocturia. He is no urinary medications and states he has never needed to take any. UA- clear, 0-2 RBCs IPPS= 1. QOL- 1 Recent PSA from 07/09/2021 was 4.1. No history of elevated PSA.  No prior prostate biopsy. No history of UTIs.  Medical records, including previous labs reviewed at today's visit. 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    Normal nuclear stress test 2011   Obesity     Obstructive sleep apnea on CPAP    Presence of indwelling urinary catheter    Shortness of breath    due to cigarette abuse   Tobacco abuse     Past Surgical History:  Past Surgical History:  Procedure Laterality Date   ABDOMINAL AORTIC ENDOVASCULAR FENESTRATED STENT GRAFT N/A 04/09/2016   Procedure: ABDOMINAL AORTIC ENDOVASCULAR FENESTRATED STENT GRAFT with left brachial artery access using ultrasound guidance;  Surgeon: Serafina Mitchell, MD;  Location: [facility];  Service: Vascular;  Laterality: N/A;   [place]   [facility]   COLONOSCOPY  2006   2006: multiple diminutive polyps in rectum, one pedunculated polyp at 10 cm s/p snare removal. Left-sided diverticulum, pedunculated polyps at 35 cm and splenic flexure s/p snare removal. No path available.    COLONOSCOPY WITH PROPOFOL N/A 11/24/2019   Procedure: COLONOSCOPY WITH PROPOFOL;  Surgeon: Daneil Dolin, MD;  Location: [facility];  Service: Endoscopy;  Laterality: N/A;  9:45am   PERIPHERAL VASCULAR CATHETERIZATION N/A 04/10/2016   Procedure: Renal Angiography;  Surgeon: Angelia Mould, MD;  Location: [facility];  Service: Cardiovascular;  Laterality: N/A;   POLYPECTOMY  11/24/2019   Procedure: POLYPECTOMY;  Surgeon: Daneil Dolin, MD;  Location: [facility];  Service: Endoscopy;;   TRANSURETHRAL RESECTION OF PROSTATE  01/07/2011   Procedure: TRANSURETHRAL RESECTION OF THE PROSTATE (TURP);  Surgeon: Inda Merlin  I Javaid;  Location: [facility];  Service: Urology;  Laterality: N/A;    Allergies:  Allergies  Allergen Reactions   No Known Allergies     Family History:  Family History  Problem Relation Age of Onset   Liver disease Father    Lymphoma Mother    Diabetes Mother    Hypertension Mother    Cancer Mother    Mental retardation Other        sibling -- from birth    Asthma Daughter    Allergic rhinitis Neg Hx    Angioedema Neg Hx    Atopy Neg Hx    Eczema Neg Hx    Immunodeficiency Neg Hx    Colon  cancer Neg Hx     Social History:  Social History   Tobacco Use   Smoking status: Every Day    Packs/day: 3.00    Years: 50.00    Pack years: 150.00    Types: Cigarettes   Smokeless tobacco: Never   Tobacco comments:    2ppd 10/30/2016 ee  Vaping Use   Vaping Use: Never used  Substance Use Topics   Alcohol use: Yes    Alcohol/week: 0.0 standard drinks    Comment: very little   Drug use: No    Review of symptoms:  Constitutional:  Negative for unexplained weight loss, night sweats, fever, chills ENT:  Negative for nose bleeds, sinus pain, painful swallowing CV:  Negative for chest pain, shortness of breath, exercise intolerance, palpitations, loss of consciousness Resp:  Negative for shortness of breath GI:  Negative for nausea, vomiting, diarrhea, bloody stools GU:  Positives noted in HPI; otherwise negative for gross hematuria, dysuria, urinary incontinence Neuro:  Negative for seizures, poor balance, limb weakness, slurred speech Psych:  Negative for lack of energy, depression, anxiety Endocrine:  Negative for polydipsia, polyuria, symptoms of hypoglycemia (dizziness, hunger, sweating) Hematologic:  Negative for anemia, purpura, petechia, prolonged or excessive bleeding Allergic:  Negative for difficulty breathing or choking as a result of exposure to anything; no shellfish allergy; no allergic response (rash/itch) to materials, foods  Physical exam: BP (!) 183/74    Pulse 92  GENERAL APPEARANCE:  Well appearing, well developed, well nourished, NAD. Pt very HOH HEENT: Atraumatic, Normocephalic NECK: Supple without lymphadenopathy or thyromegaly. LUNGS: Clear to auscultation bilaterally. HEART: Regular Rate and Rhythm without murmurs, gallops, or rubs. ABDOMEN: Soft, non-tender, No Masses. EXTREMITIES: Moves all extremities well.  Without clubbing, cyanosis. 2+ pitting edema of LEs NEUROLOGIC:  Alert and oriented x 3, normal gait, CN II-XII grossly intact.  MENTAL  STATUS:  Appropriate. BACK:  Non-tender to palpation.  No CVAT SKIN:  Warm, dry and intact.   GU: Penis:  uncircumcised Meatus: Normal Scrotum: normal, no masses Testis: normal without masses bilateral Epididymis: normal Prostate: 40 g, NT, no nodules Rectum: Normal tone,  no masses or tenderness   Results: U/A:  0-2 RBC  Results for orders placed or performed in visit on 07/18/21 (from the past 24 hour(s))  BLADDER SCAN AMB NON-IMAGING   Collection Time: 07/18/21  9:51 AM  Result Value Ref Range   Scan Result 259

## 2021-07-26 ENCOUNTER — Other Ambulatory Visit: Payer: Self-pay

## 2021-07-26 ENCOUNTER — Ambulatory Visit (INDEPENDENT_AMBULATORY_CARE_PROVIDER_SITE_OTHER): Payer: Medicare HMO

## 2021-07-26 DIAGNOSIS — J4540 Moderate persistent asthma, uncomplicated: Secondary | ICD-10-CM | POA: Diagnosis not present

## 2021-07-31 DIAGNOSIS — Z23 Encounter for immunization: Secondary | ICD-10-CM | POA: Diagnosis not present

## 2021-07-31 DIAGNOSIS — M109 Gout, unspecified: Secondary | ICD-10-CM | POA: Diagnosis not present

## 2021-07-31 DIAGNOSIS — E6609 Other obesity due to excess calories: Secondary | ICD-10-CM | POA: Diagnosis not present

## 2021-07-31 DIAGNOSIS — Z6832 Body mass index (BMI) 32.0-32.9, adult: Secondary | ICD-10-CM | POA: Diagnosis not present

## 2021-08-04 ENCOUNTER — Other Ambulatory Visit: Payer: Self-pay

## 2021-08-04 DIAGNOSIS — I714 Abdominal aortic aneurysm, without rupture, unspecified: Secondary | ICD-10-CM

## 2021-08-09 ENCOUNTER — Other Ambulatory Visit: Payer: Self-pay

## 2021-08-09 ENCOUNTER — Ambulatory Visit (INDEPENDENT_AMBULATORY_CARE_PROVIDER_SITE_OTHER): Payer: Medicare HMO | Admitting: *Deleted

## 2021-08-09 DIAGNOSIS — J4540 Moderate persistent asthma, uncomplicated: Secondary | ICD-10-CM

## 2021-08-12 ENCOUNTER — Inpatient Hospital Stay (HOSPITAL_COMMUNITY): Admission: RE | Admit: 2021-08-12 | Payer: Medicare HMO | Source: Ambulatory Visit

## 2021-08-12 ENCOUNTER — Encounter (HOSPITAL_COMMUNITY): Payer: Medicare HMO

## 2021-08-12 ENCOUNTER — Ambulatory Visit: Payer: Medicare HMO | Admitting: Surgery

## 2021-08-12 ENCOUNTER — Other Ambulatory Visit (HOSPITAL_COMMUNITY): Payer: Medicare HMO

## 2021-08-18 ENCOUNTER — Encounter (HOSPITAL_COMMUNITY): Payer: Self-pay | Admitting: Emergency Medicine

## 2021-08-18 ENCOUNTER — Other Ambulatory Visit: Payer: Self-pay

## 2021-08-18 ENCOUNTER — Emergency Department (HOSPITAL_COMMUNITY): Payer: Medicare HMO

## 2021-08-18 ENCOUNTER — Observation Stay (HOSPITAL_COMMUNITY)
Admission: EM | Admit: 2021-08-18 | Discharge: 2021-08-19 | Disposition: A | Discharge status: Home / Self Care | Payer: Medicare HMO | Attending: Internal Medicine | Admitting: Internal Medicine

## 2021-08-18 DIAGNOSIS — I2510 Atherosclerotic heart disease of native coronary artery without angina pectoris: Secondary | ICD-10-CM | POA: Insufficient documentation

## 2021-08-18 DIAGNOSIS — S298XXA Other specified injuries of thorax, initial encounter: Secondary | ICD-10-CM | POA: Insufficient documentation

## 2021-08-18 DIAGNOSIS — J45909 Unspecified asthma, uncomplicated: Secondary | ICD-10-CM | POA: Diagnosis not present

## 2021-08-18 DIAGNOSIS — W19XXXA Unspecified fall, initial encounter: Secondary | ICD-10-CM | POA: Insufficient documentation

## 2021-08-18 DIAGNOSIS — I10 Essential (primary) hypertension: Secondary | ICD-10-CM | POA: Diagnosis present

## 2021-08-18 DIAGNOSIS — R0602 Shortness of breath: Secondary | ICD-10-CM | POA: Diagnosis not present

## 2021-08-18 DIAGNOSIS — E1159 Type 2 diabetes mellitus with other circulatory complications: Secondary | ICD-10-CM | POA: Diagnosis present

## 2021-08-18 DIAGNOSIS — Z7901 Long term (current) use of anticoagulants: Secondary | ICD-10-CM | POA: Insufficient documentation

## 2021-08-18 DIAGNOSIS — Z20822 Contact with and (suspected) exposure to covid-19: Secondary | ICD-10-CM | POA: Diagnosis not present

## 2021-08-18 DIAGNOSIS — E119 Type 2 diabetes mellitus without complications: Secondary | ICD-10-CM | POA: Insufficient documentation

## 2021-08-18 DIAGNOSIS — Z7984 Long term (current) use of oral hypoglycemic drugs: Secondary | ICD-10-CM | POA: Insufficient documentation

## 2021-08-18 DIAGNOSIS — J441 Chronic obstructive pulmonary disease with (acute) exacerbation: Secondary | ICD-10-CM | POA: Diagnosis not present

## 2021-08-18 DIAGNOSIS — Z85118 Personal history of other malignant neoplasm of bronchus and lung: Secondary | ICD-10-CM | POA: Diagnosis not present

## 2021-08-18 DIAGNOSIS — I5032 Chronic diastolic (congestive) heart failure: Secondary | ICD-10-CM | POA: Diagnosis not present

## 2021-08-18 DIAGNOSIS — G4733 Obstructive sleep apnea (adult) (pediatric): Secondary | ICD-10-CM | POA: Diagnosis present

## 2021-08-18 DIAGNOSIS — Z79899 Other long term (current) drug therapy: Secondary | ICD-10-CM | POA: Diagnosis not present

## 2021-08-18 DIAGNOSIS — J449 Chronic obstructive pulmonary disease, unspecified: Secondary | ICD-10-CM | POA: Diagnosis present

## 2021-08-18 DIAGNOSIS — I110 Hypertensive heart disease with heart failure: Secondary | ICD-10-CM | POA: Insufficient documentation

## 2021-08-18 DIAGNOSIS — Y9389 Activity, other specified: Secondary | ICD-10-CM | POA: Diagnosis not present

## 2021-08-18 DIAGNOSIS — J45901 Unspecified asthma with (acute) exacerbation: Secondary | ICD-10-CM | POA: Diagnosis not present

## 2021-08-18 DIAGNOSIS — Z720 Tobacco use: Secondary | ICD-10-CM | POA: Diagnosis present

## 2021-08-18 DIAGNOSIS — R0781 Pleurodynia: Secondary | ICD-10-CM | POA: Diagnosis not present

## 2021-08-18 DIAGNOSIS — Z7951 Long term (current) use of inhaled steroids: Secondary | ICD-10-CM | POA: Diagnosis not present

## 2021-08-18 DIAGNOSIS — K760 Fatty (change of) liver, not elsewhere classified: Secondary | ICD-10-CM | POA: Diagnosis not present

## 2021-08-18 DIAGNOSIS — N400 Enlarged prostate without lower urinary tract symptoms: Secondary | ICD-10-CM | POA: Insufficient documentation

## 2021-08-18 DIAGNOSIS — S3991XA Unspecified injury of abdomen, initial encounter: Secondary | ICD-10-CM | POA: Diagnosis not present

## 2021-08-18 DIAGNOSIS — E1165 Type 2 diabetes mellitus with hyperglycemia: Secondary | ICD-10-CM | POA: Diagnosis present

## 2021-08-18 LAB — COMPREHENSIVE METABOLIC PANEL
ALT: 27 U/L (ref 0–44)
AST: 21 U/L (ref 15–41)
Albumin: 4.5 g/dL (ref 3.5–5.0)
Alkaline Phosphatase: 102 U/L (ref 38–126)
Anion gap: 8 (ref 5–15)
BUN: 12 mg/dL (ref 8–23)
CO2: 28 mmol/L (ref 22–32)
Calcium: 9.7 mg/dL (ref 8.9–10.3)
Chloride: 100 mmol/L (ref 98–111)
Creatinine, Ser: 0.9 mg/dL (ref 0.61–1.24)
GFR, Estimated: >60 mL/min (ref >60)
Glucose, Bld: 157 mg/dL — ABNORMAL HIGH (ref 70–99)
Potassium: 3.9 mmol/L (ref 3.5–5.1)
Sodium: 136 mmol/L (ref 135–145)
Total Bilirubin: 0.7 mg/dL (ref 0.3–1.2)
Total Protein: 8.3 g/dL — ABNORMAL HIGH (ref 6.5–8.1)

## 2021-08-18 LAB — CBC WITH DIFFERENTIAL/PLATELET
Abs Immature Granulocytes: 0.02 10*3/uL (ref 0.00–0.07)
Basophils Absolute: 0 10*3/uL (ref 0.0–0.1)
Basophils Relative: 1 %
Eosinophils Absolute: 0.1 10*3/uL (ref 0.0–0.5)
Eosinophils Relative: 1 %
HCT: 51.7 % (ref 39.0–52.0)
Hemoglobin: 17.7 g/dL — ABNORMAL HIGH (ref 13.0–17.0)
Immature Granulocytes: 0 %
Lymphocytes Relative: 23 %
Lymphs Abs: 1.6 10*3/uL (ref 0.7–4.0)
MCH: 33.1 pg (ref 26.0–34.0)
MCHC: 34.2 g/dL (ref 30.0–36.0)
MCV: 96.6 fL (ref 80.0–100.0)
Monocytes Absolute: 0.6 10*3/uL (ref 0.1–1.0)
Monocytes Relative: 9 %
Neutro Abs: 4.6 10*3/uL (ref 1.7–7.7)
Neutrophils Relative %: 66 %
Platelets: 163 10*3/uL (ref 150–400)
RBC: 5.35 MIL/uL (ref 4.22–5.81)
RDW: 14.2 % (ref 11.5–15.5)
WBC: 6.9 10*3/uL (ref 4.0–10.5)
nRBC: 0 % (ref 0.0–0.2)

## 2021-08-18 LAB — GLUCOSE, CAPILLARY: Glucose-Capillary: 330 mg/dL — ABNORMAL HIGH (ref 70–99)

## 2021-08-18 LAB — RESP PANEL BY RT-PCR (FLU A&B, COVID) ARPGX2
Influenza A by PCR: NEGATIVE
Influenza B by PCR: NEGATIVE
SARS Coronavirus 2 by RT PCR: NEGATIVE

## 2021-08-18 LAB — BRAIN NATRIURETIC PEPTIDE: B Natriuretic Peptide: 56 pg/mL (ref 0.0–100.0)

## 2021-08-18 MED ORDER — NICOTINE 21 MG/24HR TD PT24
21.0000 mg | MEDICATED_PATCH | Freq: Every day | TRANSDERMAL | Status: DC
  Filled 2021-08-18: qty 1

## 2021-08-18 MED ORDER — INSULIN ASPART 100 UNIT/ML IJ SOLN
0.0000 [IU] | Freq: Three times a day (TID) | INTRAMUSCULAR | Status: DC
  Administered 2021-08-19: 7 [IU] via SUBCUTANEOUS
  Administered 2021-08-19: 15 [IU] via SUBCUTANEOUS

## 2021-08-18 MED ORDER — ALBUTEROL SULFATE (2.5 MG/3ML) 0.083% IN NEBU
2.5000 mg | INHALATION_SOLUTION | Freq: Once | RESPIRATORY_TRACT | Status: AC
  Administered 2021-08-18: 2.5 mg via RESPIRATORY_TRACT
  Filled 2021-08-18: qty 3

## 2021-08-18 MED ORDER — EMPAGLIFLOZIN 10 MG PO TABS
10.0000 mg | ORAL_TABLET | Freq: Every day | ORAL | Status: DC
  Administered 2021-08-19: 10 mg via ORAL
  Filled 2021-08-18 (×3): qty 1

## 2021-08-18 MED ORDER — IOHEXOL 300 MG/ML  SOLN
100.0000 mL | Freq: Once | INTRAMUSCULAR | Status: AC | PRN
  Administered 2021-08-18: 100 mL via INTRAVENOUS

## 2021-08-18 MED ORDER — SODIUM CHLORIDE 0.9% FLUSH: 3.0000 mL | INTRAVENOUS | Status: DC | PRN

## 2021-08-18 MED ORDER — PREDNISONE 20 MG PO TABS: 40.0000 mg | ORAL_TABLET | Freq: Every day | ORAL | Status: DC

## 2021-08-18 MED ORDER — METFORMIN HCL 500 MG PO TABS
1000.0000 mg | ORAL_TABLET | Freq: Two times a day (BID) | ORAL | Status: DC
Start: 2021-08-20 — End: 2021-08-19

## 2021-08-18 MED ORDER — METHYLPREDNISOLONE SODIUM SUCC 125 MG IJ SOLR
80.0000 mg | Freq: Two times a day (BID) | INTRAMUSCULAR | Status: AC
  Administered 2021-08-18 – 2021-08-19 (×2): 80 mg via INTRAVENOUS
  Filled 2021-08-18 (×2): qty 2

## 2021-08-18 MED ORDER — INSULIN ASPART 100 UNIT/ML IJ SOLN
0.0000 [IU] | Freq: Every day | INTRAMUSCULAR | Status: DC
  Administered 2021-08-18: 4 [IU] via SUBCUTANEOUS

## 2021-08-18 MED ORDER — HYDRALAZINE HCL 25 MG PO TABS
50.0000 mg | ORAL_TABLET | Freq: Two times a day (BID) | ORAL | Status: DC
  Administered 2021-08-18 – 2021-08-19 (×2): 50 mg via ORAL
  Filled 2021-08-18 (×2): qty 2

## 2021-08-18 MED ORDER — AMLODIPINE BESYLATE 5 MG PO TABS
10.0000 mg | ORAL_TABLET | Freq: Every day | ORAL | Status: DC
  Administered 2021-08-19: 10 mg via ORAL
  Filled 2021-08-18: qty 2

## 2021-08-18 MED ORDER — ALBUTEROL SULFATE (2.5 MG/3ML) 0.083% IN NEBU
2.5000 mg | INHALATION_SOLUTION | Freq: Four times a day (QID) | RESPIRATORY_TRACT | Status: DC
  Administered 2021-08-18 – 2021-08-19 (×2): 2.5 mg via RESPIRATORY_TRACT
  Filled 2021-08-18 (×2): qty 3

## 2021-08-18 MED ORDER — ATORVASTATIN CALCIUM 20 MG PO TABS
20.0000 mg | ORAL_TABLET | Freq: Every day | ORAL | Status: DC
  Administered 2021-08-19: 20 mg via ORAL
  Filled 2021-08-18: qty 1

## 2021-08-18 MED ORDER — METHYLPREDNISOLONE SODIUM SUCC 125 MG IJ SOLR
125.0000 mg | Freq: Once | INTRAMUSCULAR | Status: AC
  Administered 2021-08-18: 125 mg via INTRAVENOUS
  Filled 2021-08-18: qty 2

## 2021-08-18 MED ORDER — LOSARTAN POTASSIUM 50 MG PO TABS
100.0000 mg | ORAL_TABLET | Freq: Every day | ORAL | Status: DC
  Administered 2021-08-19: 100 mg via ORAL
  Filled 2021-08-18: qty 2

## 2021-08-18 MED ORDER — LINAGLIPTIN 5 MG PO TABS
5.0000 mg | ORAL_TABLET | Freq: Every day | ORAL | Status: DC
Start: 2021-08-18 — End: 2021-08-18

## 2021-08-18 MED ORDER — SODIUM CHLORIDE 0.9 % IV BOLUS
500.0000 mL | Freq: Once | INTRAVENOUS | Status: AC
  Administered 2021-08-18: 500 mL via INTRAVENOUS

## 2021-08-18 MED ORDER — SERTRALINE HCL 50 MG PO TABS
50.0000 mg | ORAL_TABLET | Freq: Every day | ORAL | Status: DC
  Administered 2021-08-19: 50 mg via ORAL
  Filled 2021-08-18: qty 1

## 2021-08-18 MED ORDER — BUDESON-GLYCOPYRROL-FORMOTEROL 160-9-4.8 MCG/ACT IN AERO
2.0000 | INHALATION_SPRAY | RESPIRATORY_TRACT | Status: DC
  Filled 2021-08-18 (×6): qty 1

## 2021-08-18 MED ORDER — ENOXAPARIN SODIUM 60 MG/0.6ML IJ SOSY
50.0000 mg | PREFILLED_SYRINGE | INTRAMUSCULAR | Status: DC
  Administered 2021-08-18: 50 mg via SUBCUTANEOUS
  Filled 2021-08-18: qty 0.6

## 2021-08-18 MED ORDER — IPRATROPIUM-ALBUTEROL 0.5-2.5 (3) MG/3ML IN SOLN
3.0000 mL | Freq: Once | RESPIRATORY_TRACT | Status: AC
  Administered 2021-08-18: 3 mL via RESPIRATORY_TRACT
  Filled 2021-08-18: qty 3

## 2021-08-18 MED ORDER — ALLOPURINOL 300 MG PO TABS
300.0000 mg | ORAL_TABLET | Freq: Every day | ORAL | Status: DC
  Administered 2021-08-19: 300 mg via ORAL
  Filled 2021-08-18: qty 1

## 2021-08-18 MED ORDER — METOPROLOL SUCCINATE ER 25 MG PO TB24
25.0000 mg | ORAL_TABLET | Freq: Every day | ORAL | Status: DC
  Administered 2021-08-19: 25 mg via ORAL
  Filled 2021-08-18: qty 1

## 2021-08-18 MED ORDER — SODIUM CHLORIDE 0.9 % IV SOLN: 250.0000 mL | INTRAVENOUS | Status: DC | PRN

## 2021-08-18 MED ORDER — MAGNESIUM SULFATE 2 GM/50ML IV SOLN
2.0000 g | Freq: Once | INTRAVENOUS | Status: AC
Start: 2021-08-18 — End: 2021-08-19
  Administered 2021-08-18: 2 g via INTRAVENOUS
  Filled 2021-08-18: qty 50

## 2021-08-18 MED ORDER — ALBUTEROL SULFATE (2.5 MG/3ML) 0.083% IN NEBU: 2.5000 mg | INHALATION_SOLUTION | Freq: Four times a day (QID) | RESPIRATORY_TRACT | Status: DC | PRN

## 2021-08-18 MED ORDER — CLOPIDOGREL BISULFATE 75 MG PO TABS
75.0000 mg | ORAL_TABLET | Freq: Every day | ORAL | Status: DC
  Administered 2021-08-19: 75 mg via ORAL
  Filled 2021-08-18: qty 1

## 2021-08-18 MED ORDER — TORSEMIDE 20 MG PO TABS
20.0000 mg | ORAL_TABLET | Freq: Every day | ORAL | Status: DC
  Administered 2021-08-19: 20 mg via ORAL
  Filled 2021-08-18: qty 1

## 2021-08-18 MED ORDER — INSULIN ASPART 100 UNIT/ML IJ SOLN: 0.0000 [IU] | Freq: Three times a day (TID) | INTRAMUSCULAR | Status: DC

## 2021-08-18 MED ORDER — SODIUM CHLORIDE 0.9% FLUSH
3.0000 mL | Freq: Two times a day (BID) | INTRAVENOUS | Status: DC
  Administered 2021-08-18 – 2021-08-19 (×2): 3 mL via INTRAVENOUS

## 2021-08-18 NOTE — Assessment & Plan Note (Signed)
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

## 2021-08-18 NOTE — Progress Notes (Signed)
Patient has refused CPAP at this time and RT has unit on stand by. RN has been told and aware of plan.

## 2021-08-18 NOTE — ED Triage Notes (Signed)
Patient c/o shortness of breath that started x3 days ago. 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 Patient was c/o rib pain right before shortness of breath started. Patient c/o right rib pain. Patient does take plavix. Patient denies hitting head or LOC.  Audible wheezing noted.

## 2021-08-18 NOTE — H&P (Signed)
History and Physical    Kevin Frank ZDG:[id number] DOB: [birth year] DOA: 08/18/2021  DOS: the patient was seen and examined on 08/18/2021  PCP: Sharilyn Sites, MD   Patient coming from: Home  I have personally briefly reviewed patient's old medical records in [place]  No new subjective & objective note has been filed under this hospital service since the last note was generated.  HPI - 11o y/o man with dx GOLD II COPD/Ashtma using nocturnal supplemental oxygen, DM, HTN, HFpEF. He was in his usual state of health. He fell against a steel beam while welding striking his right side. Subsequent to that fall he has had pain in the right chest and has increased SOB/DOE so that he started using his supplement oxygen during the day. Because of persistent symptoms he presents to [facility] for evaluation.  ED Course: T 97.7  BP  139/70 HR 81  RR 20. Lab revealed nl WBC @ 6.9, Hgb 17.7 (chronic), Cmet with glucose of 157. CXR cardiomegaly, hyperexpanded w/o acute changes. CT chest/abdomen - no infiltrates, no rib fractures, no effusion. Centrilobar and panlobular empysema, Fatty liver. In ED he was treated with SABA nebs and 125mg  solumedrol. He continued to have increased oxygen requirement and wheezing. TRH is called to admit for mangement of COPD exacerbation.  Review of Systems:  Review of Systems  Constitutional:  Negative for chills and fever.  HENT:  Positive for hearing loss.   Eyes: Negative.   Respiratory:  Positive for cough, shortness of breath and wheezing. Negative for hemoptysis and sputum production.   Cardiovascular:  Negative for chest pain, palpitations and leg swelling.  Gastrointestinal: Negative.   Genitourinary: Negative.   Musculoskeletal: Negative.   Skin: Negative.   Neurological: Negative.   Endo/Heme/Allergies: Negative.   Psychiatric/Behavioral: Negative.     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    Normal nuclear stress test 2011   Obesity    Obstructive sleep apnea on CPAP    Presence of indwelling urinary catheter    Shortness of breath    due to cigarette abuse   Tobacco abuse     Past Surgical History:  Procedure Laterality Date   ABDOMINAL AORTIC ENDOVASCULAR FENESTRATED STENT GRAFT N/A 04/09/2016   Procedure: ABDOMINAL AORTIC ENDOVASCULAR FENESTRATED STENT GRAFT with left brachial artery access using ultrasound guidance;  Surgeon: Serafina Mitchell, MD;  Location: [facility];  Service: Vascular;  Laterality: N/A;   [place]   [facility]   COLONOSCOPY  2006   2006: multiple diminutive polyps in rectum, one pedunculated polyp at 10 cm s/p snare removal. Left-sided diverticulum, pedunculated polyps at 35 cm and splenic flexure s/p snare removal. No path available.    COLONOSCOPY WITH PROPOFOL N/A 11/24/2019   Procedure: COLONOSCOPY WITH PROPOFOL;  Surgeon: Daneil Dolin, MD;  Location[facility];  Service: Endoscopy;  Laterality: N/A;  9:45am   PERIPHERAL VASCULAR CATHETERIZATION N/A 04/10/2016  Procedure: Renal Angiography;  Surgeon: Angelia Mould, MD;  Location: [facility];  Service: Cardiovascular;  Laterality: N/A;   POLYPECTOMY  11/24/2019   Procedure: POLYPECTOMY;  Surgeon: Daneil Dolin, MD;  [facility];  Service: Endoscopy;;   TRANSURETHRAL RESECTION OF PROSTATE  01/07/2011   Procedure: TRANSURETHRAL RESECTION OF THE PROSTATE (TURP);  Surgeon: Marissa Nestle;  Location: [facility];  Service: Urology;  Laterality: N/A;    Soc Hx - married 15 years. One son, one daughter, 4  grands, 4 great-grands. Lives with his wife. 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 Has 450 acres in grass and trees and has someone mow and bale.   reports that he has been smoking cigarettes. He has a 150.00 pack-year smoking history. He has never used smokeless tobacco. He reports current alcohol use. He reports that he does not use drugs.  Allergies  Allergen Reactions   No Known Allergies     Family History  Problem Relation Age of Onset   Liver disease Father    Lymphoma Mother    Diabetes Mother    Hypertension Mother    Cancer Mother    Mental retardation Other        sibling -- from birth    Asthma Daughter    Allergic rhinitis Neg Hx    Angioedema Neg Hx    Atopy Neg Hx    Eczema Neg Hx    Immunodeficiency Neg Hx    Colon cancer Neg Hx     Prior to Admission medications   Medication Sig Start Date End Date Taking? Authorizing Provider  albuterol (PROVENTIL) (2.5 MG/3ML) 0.083% nebulizer solution Take 3 mLs (2.5 mg total) by nebulization every 6 (six) hours as needed for wheezing or shortness of breath. Patient not taking: Reported on 06/18/2021 03/08/21   Althea Charon, FNP  albuterol (VENTOLIN HFA) 108 [phone] Base) MCG/ACT inhaler Use 4 puffs every 4-6 hours as needed for cough or wheeze 03/08/21   Althea Charon, FNP  allopurinol (ZYLOPRIM) 300 MG tablet Take 1 tablet (300 mg total) by mouth daily. 09/07/19   Corum, Rex Kras, MD  amLODipine (NORVASC) 10 MG tablet Take 1 tablet (10 mg total) by mouth daily. 09/06/19   Burtis Junes, NP  atorvastatin (LIPITOR) 20 MG tablet Take 1 tablet (20 mg total) by mouth daily. 08/24/19   Maryruth Hancock, MD  azithromycin (ZITHROMAX) 250 MG tablet Take 2 tablets on the first day, then take 1 tablet once a day for the next 4 days, then stop 06/10/21   Ambs, Kathrine Cords, FNP  Budeson-Glycopyrrol-Formoterol (BREZTRI AEROSPHERE) 160-9-4.8 MCG/ACT AERO Inhale 2 puffs into the lungs in the  morning and at bedtime. 03/08/21   Althea Charon, FNP  clopidogrel (PLAVIX) 75 MG tablet Take 1 tablet (75 mg total) by mouth daily. 09/07/19   Corum, Rex Kras, MD  clotrimazole-betamethasone (LOTRISONE) cream  12/18/20   [provider]  EPINEPHrine 0.3 mg/0.3 mL IJ SOAJ injection  10/19/20   [provider]  hydrALAZINE (APRESOLINE) 50 MG tablet Take 1 tablet (50 mg total) by mouth in the morning and at bedtime. 12/12/20   Murlean Iba, MD  JANUVIA 100 MG tablet  12/18/20   [provider]  JARDIANCE 10 MG TABS tablet  04/22/21   [provider]  losartan (COZAAR) 100 MG tablet TAKE 1 TABLET BY MOUTH ONCE DAILY. Patient taking differently: Take  100 mg by mouth daily. 10/10/19   Corum, Rex Kras, MD  metFORMIN (GLUCOPHAGE) 500 MG tablet Take 1,000 mg by mouth 2 (two) times daily with a meal.  05/31/16   [provider]  metoprolol succinate (TOPROL-XL) 25 MG 24 hr tablet Take 1 tablet (25 mg total) by mouth daily. 09/06/19   Burtis Junes, NP  predniSONE (DELTASONE) 10 MG tablet Take 2 tabs twice daily for one week, 1 tab twice daily for one week, 1 tab daily for one week, then STOP! 01/30/21   Valentina Shaggy, MD  predniSONE (DELTASONE) 10 MG tablet Begin prednisone 10 mg tablets. Take 2 tablets twice a day for 3 days, then take 2 tablets once a day for 1 day, then take 1 tablet on the 5th day, then stop 06/10/21   Ambs, Kathrine Cords, FNP  sertraline (ZOLOFT) 50 MG tablet Take 1 tablet (50 mg total) by mouth daily. 08/29/19   Corum, Rex Kras, MD  torsemide (DEMADEX) 20 MG tablet Take 1 tablet (20 mg total) by mouth daily. 12/13/20   Murlean Iba, MD    Physical Exam: Vitals:   08/18/21 1430 08/18/21 1530 08/18/21 1630 08/18/21 1642  BP: (!) 146/76 139/70 (!) 142/69   Pulse: 87 81 77   Resp: (!) 25 20 18    Temp:      TempSrc:      SpO2: 95% 92% 91% 91%  Weight:      Height:        Physical Exam Vitals and nursing note reviewed.   Constitutional:      General: He is not in acute distress.    Appearance: He is well-developed and normal weight. He is not ill-appearing.  HENT:     Head: Normocephalic and atraumatic.  Eyes:     Extraocular Movements: Extraocular movements intact.     Pupils: Pupils are equal, round, and reactive to light.  Neck:     Thyroid: No thyromegaly.  Cardiovascular:     Rate and Rhythm: Normal rate and regular rhythm.     Pulses: Normal pulses.  Pulmonary:     Effort: Tachypnea present.     Breath sounds: Decreased air movement present. Examination of the right-upper field reveals wheezing. Examination of the left-upper field reveals wheezing. Examination of the right-middle field reveals wheezing. Examination of the left-middle field reveals wheezing. Examination of the right-lower field reveals decreased breath sounds and wheezing. Examination of the left-lower field reveals decreased breath sounds and wheezing. Decreased breath sounds and wheezing present.     Comments: Sounds tight with prolonged expiratory phase. Musculoskeletal:     Cervical back: Neck supple.  Neurological:     Mental Status: He is alert.     Labs on Admission: I have personally reviewed following labs and imaging studies  CBC: Recent Labs  Lab 08/18/21 1329  WBC 6.9  NEUTROABS 4.6  HGB 17.7*  HCT 51.7  MCV 96.6  PLT 865   Basic Metabolic Panel: Recent Labs  Lab 08/18/21 1329  NA 136  K 3.9  CL 100  CO2 28  GLUCOSE 157*  BUN 12  CREATININE 0.90  CALCIUM 9.7   GFR: Estimated Creatinine Clearance: 78.1 mL/min (by C-G formula based on SCr of 0.9 mg/dL). Liver Function Tests: Recent Labs  Lab 08/18/21 1329  AST 21  ALT 27  ALKPHOS 102  BILITOT 0.7  PROT 8.3*  ALBUMIN 4.5   No results for input(s): LIPASE, AMYLASE in the last 168 hours. No  results for input(s): AMMONIA in the last 168 hours. Coagulation Profile: No results for input(s): INR, PROTIME in the last 168 hours. Cardiac  Enzymes: No results for input(s): CKTOTAL, CKMB, CKMBINDEX, TROPONINI in the last 168 hours. BNP (last 3 results) No results for input(s): PROBNP in the last 8760 hours. HbA1C: No results for input(s): HGBA1C in the last 72 hours. CBG: No results for input(s): GLUCAP in the last 168 hours. Lipid Profile: No results for input(s): CHOL, HDL, LDLCALC, TRIG, CHOLHDL, LDLDIRECT in the last 72 hours. Thyroid Function Tests: No results for input(s): TSH, T4TOTAL, FREET4, T3FREE, THYROIDAB in the last 72 hours. Anemia Panel: No results for input(s): VITAMINB12, FOLATE, FERRITIN, TIBC, IRON, RETICCTPCT in the last 72 hours. Urine analysis:    Component Value Date/Time   COLORURINE YELLOW 04/02/2016 1037   APPEARANCEUR Clear 07/18/2021 1010   LABSPEC 1.006 04/02/2016 1037   PHURINE 6.0 04/02/2016 1037   GLUCOSEU Negative 07/18/2021 1010   HGBUR NEGATIVE 04/02/2016 1037   BILIRUBINUR Negative 07/18/2021 1010   KETONESUR NEGATIVE 04/02/2016 1037   PROTEINUR 2+ (A) 07/18/2021 1010   PROTEINUR NEGATIVE 04/02/2016 1037   NITRITE Negative 07/18/2021 1010   NITRITE NEGATIVE 04/02/2016 1037   LEUKOCYTESUR Negative 07/18/2021 1010    Radiological Exams on Admission: I have personally reviewed images CT CHEST ABDOMEN PELVIS W CONTRAST  Result Date: 08/18/2021 CLINICAL DATA:  Blunt trauma, shortness of breath EXAM: CT CHEST, ABDOMEN, AND PELVIS WITH CONTRAST TECHNIQUE: Multidetector CT imaging of the chest, abdomen and pelvis was performed following the standard protocol during bolus administration of intravenous contrast. RADIATION DOSE REDUCTION: This exam was performed according to the departmental dose-optimization program which includes automated exposure control, adjustment of the mA and/or kV according to patient size and/or use of iterative reconstruction technique. CONTRAST:  179mL OMNIPAQUE IOHEXOL 300 MG/ML  SOLN COMPARISON:  Previous studies including CT chest done on 06/12/2021 FINDINGS:  CT CHEST FINDINGS Cardiovascular: Coronary artery calcifications are seen. There is homogeneous enhancement in thoracic aorta. There are scattered atherosclerotic plaques and calcifications in the thoracic aorta. There are no filling defects in the central pulmonary artery branches. Mediastinum/Nodes: There is no evidence of mediastinal hematoma. No new significant lymphadenopathy seen. Lungs/Pleura: In image 44 of series 4, there is a linear density measuring proximally 2 x 1 cm in the medial right upper lobe with no significant change, possibly residual changes from previous radiation treatment. 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 There are linear densities in the posterior aspects of both lower lung fields suggesting scarring or subsegmental atelectasis. There is no new focal pulmonary consolidation. There is no pleural effusion or pneumothorax. Musculoskeletal: Degenerative changes are noted in the lower cervical spine with encroachment of neural foramina. Degenerative changes are noted in the lumbar spine, particularly severe at L3-L4 level CT ABDOMEN PELVIS FINDINGS Hepatobiliary: There is fatty infiltration in the liver. Surgical clips are seen in gallbladder fossa. Pancreas: No significant focal abnormality is seen. There are subcentimeter foci of fat density in the body and tail of pancreas, possibly partial volume averaging artifacts or small lipomas. Spleen: Unremarkable. Adrenals/Urinary Tract: Adrenals are unremarkable. There is no hydronephrosis. There are multiple smooth marginated fluid density lesions in the both kidneys largest measuring 7.4 cm in maximum diameter in the upper pole of right kidney. There are no renal or ureteral stones. Urinary bladder is distended. Stomach/Bowel: Stomach is unremarkable. Small bowel loops are not dilated. Cecum is higher and medial than usual. Appendix is not dilated. There is no significant  wall thickening in colon. There is no pericolic stranding.  Multiple diverticula are seen in colon without signs of focal acute diverticulitis. Vascular/Lymphatic: There is endovascular stent repair in abdominal aorta and common iliac arteries. Stent appears to be patent. Stent is noted in the proximal course of right renal artery. Major branches of abdominal aorta appear patent. Reproductive: Prostate is enlarged projecting into the base of the bladder. Other: There is no ascites or pneumoperitoneum. Right inguinal hernia containing fat is seen. Small umbilical hernia containing fat is seen. Musculoskeletal: Degenerative changes are noted in the lumbar spine, particularly severe at L3-L4 level with Schmorl's nodes and sclerosis. IMPRESSION: No acute findings are seen in the CT scan of chest, abdomen and pelvis. There are no displaced fractures. There is no laceration in the solid organs. There is no evidence of mediastinal or retroperitoneal hematoma. There is no ascites or pneumoperitoneum. Coronary artery calcifications are seen. Fatty liver. Bilateral renal cysts. Degenerative changes are noted in the lumbar spine at L3-L4 level with endplate sclerosis and Schmorl's nodes. This may be due to disc degeneration. Please correlate with clinical symptoms to rule out discitis. Prostate is enlarged projecting into the base of the bladder. This may be due to prostatic hypertrophy or neoplastic process. Other findings as described in the body of the report. Electronically Signed   By: Elmer Picker M.D.   On: 08/18/2021 15:33   DG Chest Port 1 View  Result Date: 08/18/2021 CLINICAL DATA:  Patient c/o shortness of breath that started x3 days ago. 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 Patient was c/o rib pain right before shortness of breath started. Hx of COPD, asthma, and HTN. EXAM: PORTABLE CHEST 1 VIEW COMPARISON:  06/10/2021 and older studies. FINDINGS: Cardiac silhouette borderline enlarged. No mediastinal or hilar masses. Lungs  are hyperexpanded with thickened interstitial markings. Linear opacities are noted at the left lung base consistent with scarring or atelectasis. No lung consolidation. No evidence of edema. No pleural effusion or pneumothorax. Skeletal structures are diffusely demineralized, grossly intact. IMPRESSION: 1. No acute cardiopulmonary disease. Electronically Signed   By: Lajean Manes M.D.   On: 08/18/2021 14:05     EKG: I have personally reviewed EKG: no EKG on chart  Assessment/Plan Principal Problem:   Asthma exacerbation in COPD ([place]) Active Problems:   Stage 2 moderate COPD by GOLD classification (HCC)   Chronic diastolic CHF (congestive heart failure) ([place])   Uncontrolled type 2 diabetes mellitus with hyperglycemia (HCC)   Tobacco use   OBSTRUCTIVE SLEEP APNEA   Essential hypertension, benign    Assessment and Plan: Stage 2 moderate COPD by GOLD classification ([place])- (present on admission) Patient presents with increased SOB and increased oxygen requirement. Acute injury or pulmonary infection ruled out. Working diagnosis is acute asthmatic exacerbation GOLD II COPD \ Plan SABA nebs q 6  Continue home regimen of pulmonary meds  Oxygen support to keep sats >90%  Uncontrolled type 2 diabetes mellitus with hyperglycemia ([place])- (present on admission) Lat A1C 12/10/20  8.1%  Plan Continue home medication  SS for patient on steroids  Chronic diastolic CHF (congestive heart failure) ([place])- (present on admission) History of DD grade I by ECHO 11/15/19. Does not appear decompensated.  Plan Add BNP to labs  Continue all home cardiac meds, including Jardiance  Essential hypertension, benign- (present on admission) At admission BP adequately controlled.  Plan Continue home medications  OBSTRUCTIVE SLEEP APNEA- (present on admission) Will continue home  treatment  Tobacco use- (present on admission) Patient continues to smoke in setting of GOLD II COPD  Plan Smoking cessation  education  Nicotine patch while inpatient   Code status - had as full conversation with patient, (who initially said he would not want to be on life support) wife and daughter. The chance of successful resuscitation explained and the sequelae of success reviewed including probability of survival after resuscitation. With this information in hand patient wishes to be full code. Provided daughter with reference to [facility] for family's consideration.     DVT prophylaxis: Lovenox Code Status: Full code Family Communication: spoke with Payton Emerald - wife. Reviewed Dx and Tx plan. Discussed code status - after detailed conversation Mr. Hodsdon stated he would not life-support - limited code/DNI- she didn't want to consider this issue but seemed OK with his decision.  Disposition Plan: home when stable  Consults called: none  Admission status: Observation, Med-Surg   Adella Hare, MD [facility] 08/18/2021, 6:00 PM

## 2021-08-18 NOTE — Assessment & Plan Note (Addendum)
Patient presents with increased SOB and increased oxygen requirement. Acute injury or pulmonary infection ruled out. Working diagnosis is acute asthmatic exacerbation GOLD II COPD \ Plan SABA nebs q 6  Continue home regimen of pulmonary meds  Oxygen support to keep sats >90%

## 2021-08-18 NOTE — Assessment & Plan Note (Signed)
Lat A1C 12/10/20  8.1%  Plan Continue home medication  SS for patient on steroids

## 2021-08-18 NOTE — ED Provider Notes (Signed)
[facility] Provider Note   CSN: [id number] Arrival date & time: 08/18/21  1253     History  Chief Complaint  Patient presents with   Shortness of Breath    Kevin Frank is a 81 y.o. male.  Patient has a history of COPD.  Supposedly he fell hit his left chest against a piece of metal 4 days ago and since that time he has been short of breath and wheezing more than usual.  He uses oxygen at night.  Patient's O2 sat in the emergency department on room air with 85%  The history is provided by the patient and medical records. No language interpreter was used.  Shortness of Breath Severity:  Moderate Onset quality:  Sudden Timing:  Constant Progression:  Worsening Chronicity:  New Context: activity   Relieved by:  Nothing Worsened by:  Nothing Ineffective treatments:  None tried Associated symptoms: wheezing   Associated symptoms: no abdominal pain, no chest pain, no cough, no headaches and no rash       Home Medications Prior to Admission medications   Medication Sig Start Date End Date Taking? Authorizing Provider  albuterol (PROVENTIL) (2.5 MG/3ML) 0.083% nebulizer solution Take 3 mLs (2.5 mg total) by nebulization every 6 (six) hours as needed for wheezing or shortness of breath. Patient not taking: Reported on 06/18/2021 03/08/21   Althea Charon, FNP  albuterol (VENTOLIN HFA) 108 [phone] Base) MCG/ACT inhaler Use 4 puffs every 4-6 hours as needed for cough or wheeze 03/08/21   Althea Charon, FNP  allopurinol (ZYLOPRIM) 300 MG tablet Take 1 tablet (300 mg total) by mouth daily. 09/07/19   Corum, Rex Kras, MD  amLODipine (NORVASC) 10 MG tablet Take 1 tablet (10 mg total) by mouth daily. 09/06/19   Burtis Junes, NP  atorvastatin (LIPITOR) 20 MG tablet Take 1 tablet (20 mg total) by mouth daily. 08/24/19   Maryruth Hancock, MD  azithromycin (ZITHROMAX) 250 MG tablet Take 2 tablets on the first day, then take 1 tablet once a day for the next 4 days, then stop  06/10/21   Ambs, Kathrine Cords, FNP  Budeson-Glycopyrrol-Formoterol (BREZTRI AEROSPHERE) 160-9-4.8 MCG/ACT AERO Inhale 2 puffs into the lungs in the morning and at bedtime. 03/08/21   Althea Charon, FNP  clopidogrel (PLAVIX) 75 MG tablet Take 1 tablet (75 mg total) by mouth daily. 09/07/19   Corum, Rex Kras, MD  clotrimazole-betamethasone (LOTRISONE) cream  12/18/20   [provider]  EPINEPHrine 0.3 mg/0.3 mL IJ SOAJ injection  10/19/20   [provider]  hydrALAZINE (APRESOLINE) 50 MG tablet Take 1 tablet (50 mg total) by mouth in the morning and at bedtime. 12/12/20   Murlean Iba, MD  JANUVIA 100 MG tablet  12/18/20   [provider]  JARDIANCE 10 MG TABS tablet  04/22/21   [provider]  losartan (COZAAR) 100 MG tablet TAKE 1 TABLET BY MOUTH ONCE DAILY. Patient taking differently: Take 100 mg by mouth daily. 10/10/19   Corum, Rex Kras, MD  metFORMIN (GLUCOPHAGE) 500 MG tablet Take 1,000 mg by mouth 2 (two) times daily with a meal.  05/31/16   [provider]  metoprolol succinate (TOPROL-XL) 25 MG 24 hr tablet Take 1 tablet (25 mg total) by mouth daily. 09/06/19   Burtis Junes, NP  predniSONE (DELTASONE) 10 MG tablet Take 2 tabs twice daily for one week, 1 tab twice daily for one week, 1 tab daily for one week, then STOP! 01/30/21  Valentina Shaggy, MD  predniSONE (DELTASONE) 10 MG tablet Begin prednisone 10 mg tablets. Take 2 tablets twice a day for 3 days, then take 2 tablets once a day for 1 day, then take 1 tablet on the 5th day, then stop 06/10/21   Ambs, Kathrine Cords, FNP  sertraline (ZOLOFT) 50 MG tablet Take 1 tablet (50 mg total) by mouth daily. 08/29/19   Corum, Rex Kras, MD  torsemide (DEMADEX) 20 MG tablet Take 1 tablet (20 mg total) by mouth daily. 12/13/20   Murlean Iba, MD      Allergies    No known allergies    Review of Systems   Review of Systems  Constitutional:  Negative for appetite change and fatigue.  HENT:  Negative for  congestion, ear discharge and sinus pressure.   Eyes:  Negative for discharge.  Respiratory:  Positive for shortness of breath and wheezing. Negative for cough.   Cardiovascular:  Negative for chest pain.  Gastrointestinal:  Negative for abdominal pain and diarrhea.  Genitourinary:  Negative for frequency and hematuria.  Musculoskeletal:  Negative for back pain.  Skin:  Negative for rash.  Neurological:  Negative for seizures and headaches.  Psychiatric/Behavioral:  Negative for hallucinations.    Physical Exam Updated Vital Signs BP (!) 146/76    Pulse 87    Temp 97.7 F (36.5 C) (Oral)    Resp (!) 25    Ht 5' 10.5" (1.791 m)    Wt 99.8 kg    SpO2 95%    BMI 31.12 kg/m  Physical Exam Vitals and nursing note reviewed.  Constitutional:      Appearance: He is well-developed.  HENT:     Head: Normocephalic.     Nose: Nose normal.  Eyes:     General: No scleral icterus.    Conjunctiva/sclera: Conjunctivae normal.  Neck:     Thyroid: No thyromegaly.  Cardiovascular:     Rate and Rhythm: Normal rate and regular rhythm.     Heart sounds: No murmur heard.   No friction rub. No gallop.  Pulmonary:     Breath sounds: No stridor. Wheezing present. No rales.  Chest:     Chest wall: No tenderness.  Abdominal:     General: There is no distension.     Tenderness: There is no abdominal tenderness. There is no rebound.  Musculoskeletal:        General: Normal range of motion.     Cervical back: Neck supple.  Lymphadenopathy:     Cervical: No cervical adenopathy.  Skin:    Findings: No erythema or rash.  Neurological:     Mental Status: He is alert and oriented to person, place, and time.     Motor: No abnormal muscle tone.     Coordination: Coordination normal.  Psychiatric:        Behavior: Behavior normal.    ED Results / Procedures / Treatments   Labs (all labs ordered are listed, but only abnormal results are displayed) Labs Reviewed  CBC WITH DIFFERENTIAL/PLATELET -  Abnormal; Notable for the following components:      Result Value   Hemoglobin 17.7 (*)    All other components within normal limits  COMPREHENSIVE METABOLIC PANEL - Abnormal; Notable for the following components:   Glucose, Bld 157 (*)    Total Protein 8.3 (*)    All other components within normal limits    EKG None  Radiology DG Chest Port 1 View  Result Date: 08/18/2021  CLINICAL DATA:  Patient c/o shortness of breath that started x3 days ago. 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 Patient was c/o rib pain right before shortness of breath started. Hx of COPD, asthma, and HTN. EXAM: PORTABLE CHEST 1 VIEW COMPARISON:  06/10/2021 and older studies. FINDINGS: Cardiac silhouette borderline enlarged. No mediastinal or hilar masses. Lungs are hyperexpanded with thickened interstitial markings. Linear opacities are noted at the left lung base consistent with scarring or atelectasis. No lung consolidation. No evidence of edema. No pleural effusion or pneumothorax. Skeletal structures are diffusely demineralized, grossly intact. IMPRESSION: 1. No acute cardiopulmonary disease. Electronically Signed   By: Lajean Manes M.D.   On: 08/18/2021 14:05    Procedures Procedures    Medications Ordered in ED Medications  magnesium sulfate IVPB 2 g 50 mL (2 g Intravenous New Bag/Given 08/18/21 1502)  sodium chloride 0.9 % bolus 500 mL (0 mLs Intravenous Stopped 08/18/21 1507)  ipratropium-albuterol (DUONEB) 0.5-2.5 (3) MG/3ML nebulizer solution 3 mL (3 mLs Nebulization Given 08/18/21 1459)  albuterol (PROVENTIL) (2.5 MG/3ML) 0.083% nebulizer solution 2.5 mg (2.5 mg Nebulization Given 08/18/21 1459)  methylPREDNISolone sodium succinate (SOLU-MEDROL) 125 mg/2 mL injection 125 mg (125 mg Intravenous Given 08/18/21 1459)  iohexol (OMNIPAQUE) 300 MG/ML solution 100 mL (100 mLs Intravenous Contrast Given 08/18/21 1446)    ED Course/ Medical Decision Making/ A&P                            Medical Decision Making Amount and/or Complexity of Data Reviewed Labs: ordered. Radiology: ordered.  Risk Prescription drug management. Decision regarding hospitalization.   Patient with exacerbation of COPD and contusion of chest wall.  CT scan pending.  Patient is hypoxic and getting neb treatment    Patient CT scan was negative.  He was admitted for COPD exacerbation by Dr. Linda Hedges  This patient presents to the ED for concern of shortness of breath and fall, this involves an extensive number of treatment options, and is a complaint that carries with it a high risk of complications and morbidity.  The differential diagnosis includes COPD exacerbation and fractured ribs   Co morbidities that complicate the patient evaluation  COPD   Additional history obtained:  Additional history obtained from wife External records from outside source obtained and reviewed including hospital records   Lab Tests:  I Ordered, and personally interpreted labs.  The pertinent results include: CBC which is unremarkable, chemistries show mild elevation glucose   Imaging Studies ordered:  I ordered imaging studies including chest x-ray I independently visualized and interpreted imaging which showed no acute disease I agree with the radiologist interpretation   Cardiac Monitoring:  The patient was maintained on a cardiac monitor.  I personally viewed and interpreted the cardiac monitored which showed an underlying rhythm of: Sinus tach   Medicines ordered and prescription drug management:  I ordered medication including steroids, albuterol and Atrovent Reevaluation of the patient after these medicines showed that the patient improved I have reviewed the patients home medicines and have made adjustments as needed   Test Considered:  None   Critical Interventions:  Steroids and neb treatment   Consultations Obtained:  I requested consultation with the hospitalist,   and discussed lab and imaging findings as well as pertinent plan - they recommend: Admission for COPD exacerbation   Problem List / ED Course:  Hypoxia with COPD   Reevaluation:  After  the interventions noted above, I reevaluated the patient and found that they have :improved   Social Determinants of Health:  None   Dispostion:  After consideration of the diagnostic results and the patients response to treatment, I feel that the patent would benefit from hospitalization and treatment of COPD.         Final Clinical Impression(s) / ED Diagnoses Final diagnoses:  SOB (shortness of breath)    Rx / DC Orders ED Discharge Orders     None         Milton Ferguson, MD 08/19/21 [phone]

## 2021-08-18 NOTE — Assessment & Plan Note (Addendum)
History of DD grade I by ECHO 11/15/19. Does not appear decompensated.  Plan Add BNP to labs  Continue all home cardiac meds, including Jardiance

## 2021-08-18 NOTE — Assessment & Plan Note (Signed)
Will continue home treatment

## 2021-08-18 NOTE — Assessment & Plan Note (Signed)
At admission BP adequately controlled.  Plan Continue home medications

## 2021-08-18 NOTE — ED Provider Notes (Signed)
Patient was initially seen by Dr. Roderic Palau.  Please see his note.  Plan was to follow-up on CT scan.  No evidence of acute abnormality noted on CT scan.  Pt still has an o2 requirement, coughing.    Case discussed with Dr Linda Hedges regarding admission   Dorie Rank, MD 08/18/21 [phone]

## 2021-08-19 DIAGNOSIS — J441 Chronic obstructive pulmonary disease with (acute) exacerbation: Secondary | ICD-10-CM | POA: Diagnosis not present

## 2021-08-19 DIAGNOSIS — J45901 Unspecified asthma with (acute) exacerbation: Secondary | ICD-10-CM | POA: Diagnosis not present

## 2021-08-19 LAB — GLUCOSE, CAPILLARY
Glucose-Capillary: 216 mg/dL — ABNORMAL HIGH (ref 70–99)
Glucose-Capillary: 301 mg/dL — ABNORMAL HIGH (ref 70–99)

## 2021-08-19 LAB — HEMOGLOBIN A1C
Hgb A1c MFr Bld: 7.2 % — ABNORMAL HIGH (ref 4.8–5.6)
Mean Plasma Glucose: 159.94 mg/dL

## 2021-08-19 MED ORDER — ALBUTEROL SULFATE (2.5 MG/3ML) 0.083% IN NEBU: 2.5000 mg | INHALATION_SOLUTION | Freq: Two times a day (BID) | RESPIRATORY_TRACT | Status: DC

## 2021-08-19 MED ORDER — METFORMIN HCL 500 MG PO TABS: 1000.0000 mg | ORAL_TABLET | Freq: Two times a day (BID) | ORAL | 0 refills | Status: DC

## 2021-08-19 MED ORDER — PREDNISONE 20 MG PO TABS: 40.0000 mg | ORAL_TABLET | Freq: Every day | ORAL | 0 refills | Status: AC

## 2021-08-19 NOTE — Progress Notes (Signed)
Pt has discharge orders, discharge teaching given and no further questions at this time. Pt wheeled down to main lobby via w/c.

## 2021-08-19 NOTE — Evaluation (Signed)
Physical Therapy Evaluation Patient Details Name: Kevin Frank MRN: [id number] DOB: 23-Feb-1941 Today's Date: 08/19/2021  History of Present Illness  81 y/o man with dx GOLD II COPD/Ashtma using nocturnal supplemental oxygen, DM, HTN, HFpEF. He was in his usual state of health. He fell against a steel beam while welding striking his right side. Subsequent to that fall he has had pain in the right chest and has increased SOB/DOE so that he started using his supplement oxygen during the day. Because of persistent symptoms he presents to [facility] for evaluation.   Clinical Impression  Patient functioning at baseline for functional mobility and gait, ambulated in hallway on room air without loss of balance, SpO2 at 91-92% - RN aware.  Plan:  Patient discharged from physical therapy to care of nursing for ambulation daily as tolerated for length of stay.         Recommendations for follow up therapy are one component of a multi-disciplinary discharge planning process, led by the attending physician.  Recommendations may be updated based on patient status, additional functional criteria and insurance authorization.  Follow Up Recommendations No PT follow up    Assistance Recommended at Discharge PRN  Patient can return home with the following  Other (comment) (near baseline)    Equipment Recommendations None recommended by PT  Recommendations for Other Services       Functional Status Assessment Patient has not had a recent decline in their functional status     Precautions / Restrictions Precautions Precautions: None Restrictions Weight Bearing Restrictions: No      Mobility  Bed Mobility Overal bed mobility: Independent                  Transfers Overall transfer level: Independent                      Ambulation/Gait Ambulation/Gait assistance: Modified independent (Device/Increase time) Gait Distance (Feet): 200 Feet Assistive device: None Gait  Pattern/deviations: WFL(Within Functional Limits) Gait velocity: slightly decreased     General Gait Details: grossly WFL with good return for ambulation in room and hallway without loss of balance, on room air with SpO2 at 92%  Stairs            Wheelchair Mobility    Modified Rankin (Stroke Patients Only)       Balance Overall balance assessment: No apparent balance deficits (not formally assessed)                                           Pertinent Vitals/Pain Pain Assessment Pain Assessment: No/denies pain    Home Living Family/patient expects to be discharged to:: Private residence Living Arrangements: Spouse/significant other Available Help at Discharge: Family;Available 24 hours/day Type of Home: House Home Access: Stairs to enter Entrance Stairs-Rails: None Entrance Stairs-Number of Steps: 2   Home Layout: Two level;Laundry or work area in basement;Able to live on main level with bedroom/bathroom Home Equipment: Conservation officer, nature (2 wheels)      Prior Function Prior Level of Function : Independent/Modified Independent             Mobility Comments: Hydrographic surveyor, drives ADLs Comments: Independent     Hand Dominance   Dominant Hand: Left    Extremity/Trunk Assessment   Upper Extremity Assessment Upper Extremity Assessment: Overall WFL for tasks assessed    Lower Extremity Assessment Lower  Extremity Assessment: Overall WFL for tasks assessed    Cervical / Trunk Assessment Cervical / Trunk Assessment: Normal  Communication   Communication: HOH  Cognition Arousal/Alertness: Awake/alert Behavior During Therapy: WFL for tasks assessed/performed Overall Cognitive Status: Within Functional Limits for tasks assessed                                          General Comments      Exercises     Assessment/Plan    PT Assessment Patient does not need any further PT services  PT Problem List          PT Treatment Interventions      PT Goals (Current goals can be found in the Care Plan section)  Acute Rehab PT Goals Patient Stated Goal: return home with family to assist PT Goal Formulation: With patient Time For Goal Achievement: 08/19/21 Potential to Achieve Goals: Good    Frequency       Co-evaluation               AM-PAC PT "6 Clicks" Mobility  Outcome Measure Help needed turning from your back to your side while in a flat bed without using bedrails?: None Help needed moving from lying on your back to sitting on the side of a flat bed without using bedrails?: None Help needed moving to and from a bed to a chair (including a wheelchair)?: None Help needed standing up from a chair using your arms (e.g., wheelchair or bedside chair)?: None Help needed to walk in hospital room?: None Help needed climbing 3-5 steps with a railing? : None 6 Click Score: 24    End of Session   Activity Tolerance: Patient tolerated treatment well Patient left: in bed;with call bell/phone within reach Nurse Communication: Mobility status PT Visit Diagnosis: Unsteadiness on feet (R26.81);Other abnormalities of gait and mobility (R26.89);Muscle weakness (generalized) (M62.81)    Time: 4196-2229 PT Time Calculation (min) (ACUTE ONLY): 15 min   Charges:   PT Evaluation $PT Eval Low Complexity: 1 Low PT Treatments $Therapeutic Activity: 8-22 mins        10:39 AM, 08/19/21 Lonell Grandchild, MPT Physical Therapist with [facility] [phone] office [phone] mobile phone

## 2021-08-19 NOTE — Progress Notes (Signed)
OT Cancellation Note  Patient Details Name: FIORE DETJEN MRN: [id number] DOB: Apr 13, 1941   Cancelled Treatment:    Reason Eval/Treat Not Completed: OT screened, no needs identified, will sign off. Patient is functioning at baseline and no follow up OT needs identified. Thank you for the referral.     Ailene Ravel, OTR/L,CBIS  [phone]  08/19/2021, 9:42 AM

## 2021-08-19 NOTE — TOC Transition Note (Signed)
Transition of Care [facility]) - CM/SW Discharge Note   Patient Details  Name: Kevin Frank MRN: [id number] Date of Birth: 30-Jan-1941  Transition of Care [facility] CM/SW Contact:  Boneta Lucks, RN Phone Number: 08/19/2021, 10:20 AM   Clinical Narrative:   Patient discharging home today. Patient uses oxygen at night provided by [employer]. Patient needing 1L  ambulating. MD ordering continuous. Ashly with [employer] updated with new order.    Final next level of care: Home/Self Care Barriers to Discharge: Barriers Resolved  Patient Goals and CMS Choice Patient states their goals for this hospitalization and ongoing recovery are:: to go home. CMS Medicare.gov Compare Post Acute Care list provided to:: Patient Represenative (must comment) Choice offered to / list presented to : Spouse  Discharge Placement           Patient to be transferred to facility by: Wife Name of family member notified: Wife Patient and family notified of of transfer: 08/19/21  Discharge Plan and Services              DME Arranged: Oxygen DME Agency: [employer] Date DME Agency Contacted: 08/19/21 Time DME Agency Contacted: 14 Representative spoke with at DME Agency: Leatrice Jewels

## 2021-08-19 NOTE — Discharge Summary (Signed)
Physician Discharge Summary   Patient: Kevin Frank MRN: [id number] DOB: 1940-10-29  Admit date:     08/18/2021  Discharge date: 08/19/21  Discharge Physician: Rodena Goldmann   PCP: Sharilyn Sites, MD   Recommendations at discharge:   Follow-up with PCP in 1 week Continue on prednisone as prescribed for 5 more days to complete course of treatment Breathing treatments at home as needed Continue on 1 L nasal cannula with ambulation and has oxygen at bedtime Continue other home medications as prior  Discharge Diagnoses: Principal Problem:   Asthma exacerbation in COPD ([place]) Active Problems:   Tobacco use   OBSTRUCTIVE SLEEP APNEA   Essential hypertension, benign   Stage 2 moderate COPD by GOLD classification ([place])   Chronic diastolic CHF (congestive heart failure) ([place])   Uncontrolled type 2 diabetes mellitus with hyperglycemia ([place])  Resolved Problems:   * No resolved hospital problems. [facility] Course: Per HPI: 81 y/o man with dx GOLD II COPD/Ashtma using nocturnal supplemental oxygen, DM, HTN, HFpEF. He was in his usual state of health. He fell against a steel beam while welding striking his right side. Subsequent to that fall he has had pain in the right chest and has increased SOB/DOE so that he started using his supplement oxygen during the day. Because of persistent symptoms he presents to [facility] for evaluation.  Patient was admitted with mild acute hypoxemic respiratory failure in the setting of COPD exacerbation.  He appears to be in stable condition for discharge today with minimal wheezing noted after initiation of steroids and breathing treatments.  He is eager to go home as well.  He has been ambulated and does not experience any significant shortness of breath or desaturation of oxygen, but does appear to require 1 L nasal cannula.  He does have home oxygen set up and will have some alterations to his requirements moving forward.  BNP was not noted to be elevated  and he is in stable condition for discharge.  Assessment and Plan:   Principal discharge diagnosis: Mild acute hypoxemic respiratory failure in the setting of COPD exacerbation.       Consultants: None Procedures performed: None Disposition: Home Diet recommendation:  Discharge Diet Orders (From admission, onward)     Start     Ordered   08/19/21 0000  Diet - low sodium heart healthy        08/19/21 1016           Cardiac and Carb modified diet  DISCHARGE MEDICATION: Allergies as of 08/19/2021       Reactions   No Known Allergies         Medication List     STOP taking these medications    azithromycin 250 MG tablet Commonly known as: Zithromax       TAKE these medications    albuterol 108 (90 Base) MCG/ACT inhaler Commonly known as: VENTOLIN HFA Use 4 puffs every 4-6 hours as needed for cough or wheeze   albuterol (2.5 MG/3ML) 0.083% nebulizer solution Commonly known as: PROVENTIL Take 3 mLs (2.5 mg total) by nebulization every 6 (six) hours as needed for wheezing or shortness of breath.   allopurinol 300 MG tablet Commonly known as: ZYLOPRIM Take 1 tablet (300 mg total) by mouth daily.   amLODipine 10 MG tablet Commonly known as: NORVASC Take 1 tablet (10 mg total) by mouth daily.   atorvastatin 20 MG tablet Commonly known as: LIPITOR Take 1 tablet (20 mg total)  by mouth daily.   Breztri Aerosphere 160-9-4.8 MCG/ACT Aero Generic drug: Budeson-Glycopyrrol-Formoterol Inhale 2 puffs into the lungs in the morning and at bedtime.   clopidogrel 75 MG tablet Commonly known as: PLAVIX Take 1 tablet (75 mg total) by mouth daily.   hydrALAZINE 50 MG tablet Commonly known as: APRESOLINE Take 1 tablet (50 mg total) by mouth in the morning and at bedtime.   indomethacin 50 MG capsule Commonly known as: INDOCIN Take by mouth.   Jardiance 10 MG Tabs tablet Generic drug: empagliflozin Take 10 mg by mouth daily.   losartan 100 MG  tablet Commonly known as: COZAAR TAKE 1 TABLET BY MOUTH ONCE DAILY.   metFORMIN 500 MG tablet Commonly known as: GLUCOPHAGE Take 2 tablets (1,000 mg total) by mouth 2 (two) times daily with a meal.   metoprolol succinate 25 MG 24 hr tablet Commonly known as: TOPROL-XL Take 1 tablet (25 mg total) by mouth daily.   predniSONE 20 MG tablet Commonly known as: DELTASONE Take 2 tablets (40 mg total) by mouth daily with breakfast for 5 days. What changed:  medication strength how much to take how to take this when to take this additional instructions Another medication with the same name was removed. Continue taking this medication, and follow the directions you see here.   sertraline 50 MG tablet Commonly known as: ZOLOFT Take 1 tablet (50 mg total) by mouth daily.   torsemide 20 MG tablet Commonly known as: DEMADEX Take 1 tablet (20 mg total) by mouth daily.               Durable Medical Equipment  (From admission, onward)           Start     Ordered   08/19/21 1016  DME Oxygen  Once       Question Answer Comment  Length of Need 6 Months   Mode or (Route) Nasal cannula   Liters per Minute 1   Frequency Continuous (stationary and portable oxygen unit needed)   Oxygen conserving device Yes   Oxygen delivery system Gas      08/19/21 1016            Follow-up Information     Sharilyn Sites, MD. Schedule an appointment as soon as possible for a visit in 1 week(s).   Specialty: Family Medicine Contact information: [address] [place] [address] [phone]                 Discharge Exam: Danley Danker Weights   08/18/21 1307  Weight: 99.8 kg    Condition at discharge: good  The results of significant diagnostics from this hospitalization (including imaging, microbiology, ancillary and laboratory) are listed below for reference.   Imaging Studies: CT CHEST ABDOMEN PELVIS W CONTRAST  Result Date: 08/18/2021 CLINICAL DATA:  Blunt  trauma, shortness of breath EXAM: CT CHEST, ABDOMEN, AND PELVIS WITH CONTRAST TECHNIQUE: Multidetector CT imaging of the chest, abdomen and pelvis was performed following the standard protocol during bolus administration of intravenous contrast. RADIATION DOSE REDUCTION: This exam was performed according to the departmental dose-optimization program which includes automated exposure control, adjustment of the mA and/or kV according to patient size and/or use of iterative reconstruction technique. CONTRAST:  150mL OMNIPAQUE IOHEXOL 300 MG/ML  SOLN COMPARISON:  Previous studies including CT chest done on 06/12/2021 FINDINGS: CT CHEST FINDINGS Cardiovascular: Coronary artery calcifications are seen. There is homogeneous enhancement in thoracic aorta. There are scattered atherosclerotic plaques and calcifications in the thoracic  aorta. There are no filling defects in the central pulmonary artery branches. Mediastinum/Nodes: There is no evidence of mediastinal hematoma. No new significant lymphadenopathy seen. Lungs/Pleura: In image 44 of series 4, there is a linear density measuring proximally 2 x 1 cm in the medial right upper lobe with no significant change, possibly residual changes from previous radiation treatment. 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 There are linear densities in the posterior aspects of both lower lung fields suggesting scarring or subsegmental atelectasis. There is no new focal pulmonary consolidation. There is no pleural effusion or pneumothorax. Musculoskeletal: Degenerative changes are noted in the lower cervical spine with encroachment of neural foramina. Degenerative changes are noted in the lumbar spine, particularly severe at L3-L4 level CT ABDOMEN PELVIS FINDINGS Hepatobiliary: There is fatty infiltration in the liver. Surgical clips are seen in gallbladder fossa. Pancreas: No significant focal abnormality is seen. There are subcentimeter foci of fat density in the body and  tail of pancreas, possibly partial volume averaging artifacts or small lipomas. Spleen: Unremarkable. Adrenals/Urinary Tract: Adrenals are unremarkable. There is no hydronephrosis. There are multiple smooth marginated fluid density lesions in the both kidneys largest measuring 7.4 cm in maximum diameter in the upper pole of right kidney. There are no renal or ureteral stones. Urinary bladder is distended. Stomach/Bowel: Stomach is unremarkable. Small bowel loops are not dilated. Cecum is higher and medial than usual. Appendix is not dilated. There is no significant wall thickening in colon. There is no pericolic stranding. Multiple diverticula are seen in colon without signs of focal acute diverticulitis. Vascular/Lymphatic: There is endovascular stent repair in abdominal aorta and common iliac arteries. Stent appears to be patent. Stent is noted in the proximal course of right renal artery. Major branches of abdominal aorta appear patent. Reproductive: Prostate is enlarged projecting into the base of the bladder. Other: There is no ascites or pneumoperitoneum. Right inguinal hernia containing fat is seen. Small umbilical hernia containing fat is seen. Musculoskeletal: Degenerative changes are noted in the lumbar spine, particularly severe at L3-L4 level with Schmorl's nodes and sclerosis. IMPRESSION: No acute findings are seen in the CT scan of chest, abdomen and pelvis. There are no displaced fractures. There is no laceration in the solid organs. There is no evidence of mediastinal or retroperitoneal hematoma. There is no ascites or pneumoperitoneum. Coronary artery calcifications are seen. Fatty liver. Bilateral renal cysts. Degenerative changes are noted in the lumbar spine at L3-L4 level with endplate sclerosis and Schmorl's nodes. This may be due to disc degeneration. Please correlate with clinical symptoms to rule out discitis. Prostate is enlarged projecting into the base of the bladder. This may be due to  prostatic hypertrophy or neoplastic process. Other findings as described in the body of the report. Electronically Signed   By: Elmer Picker M.D.   On: 08/18/2021 15:33   DG Chest Port 1 View  Result Date: 08/18/2021 CLINICAL DATA:  Patient c/o shortness of breath that started x3 days ago. 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 Patient was c/o rib pain right before shortness of breath started. Hx of COPD, asthma, and HTN. EXAM: PORTABLE CHEST 1 VIEW COMPARISON:  06/10/2021 and older studies. FINDINGS: Cardiac silhouette borderline enlarged. No mediastinal or hilar masses. Lungs are hyperexpanded with thickened interstitial markings. Linear opacities are noted at the left lung base consistent with scarring or atelectasis. No lung consolidation. No evidence of edema. No pleural effusion or pneumothorax. Skeletal structures are diffusely  demineralized, grossly intact. IMPRESSION: 1. No acute cardiopulmonary disease. Electronically Signed   By: Lajean Manes M.D.   On: 08/18/2021 14:05    Microbiology: Results for orders placed or performed during the hospital encounter of 08/18/21  Resp Panel by RT-PCR (Flu A&B, Covid) Nasopharyngeal Swab     Status: None   Collection Time: 08/18/21  3:40 PM   Specimen: Nasopharyngeal Swab; Nasopharyngeal(NP) swabs in vial transport medium  Result Value Ref Range Status   SARS Coronavirus 2 by RT PCR NEGATIVE NEGATIVE Final    Comment: (NOTE) SARS-CoV-2 target nucleic acids are NOT DETECTED.  The SARS-CoV-2 RNA is generally detectable in upper respiratory specimens during the acute phase of infection. The lowest concentration of SARS-CoV-2 viral copies this assay can detect is 138 copies/mL. A negative result does not preclude SARS-Cov-2 infection and should not be used as the sole basis for treatment or other patient management decisions. A negative result may occur with  improper specimen collection/handling, submission  of specimen other than nasopharyngeal swab, presence of viral mutation(s) within the areas targeted by this assay, and inadequate number of viral copies(<138 copies/mL). A negative result must be combined with clinical observations, patient history, and epidemiological information. The expected result is Negative.  Fact Sheet for Patients:  EntrepreneurPulse.com.au  Fact Sheet for Healthcare Providers:  IncredibleEmployment.be  This test is no t yet approved or cleared by the [place] FDA and  has been authorized for detection and/or diagnosis of SARS-CoV-2 by FDA under an Emergency Use Authorization (EUA). This EUA will remain  in effect (meaning this test can be used) for the duration of the COVID-19 declaration under Section 564(b)(1) of the Act, 21 U.S.C.section 360bbb-3(b)(1), unless the authorization is terminated  or revoked sooner.       Influenza A by PCR NEGATIVE NEGATIVE Final   Influenza B by PCR NEGATIVE NEGATIVE Final    Comment: (NOTE) The Xpert Xpress SARS-CoV-2/FLU/RSV plus assay is intended as an aid in the diagnosis of influenza from Nasopharyngeal swab specimens and should not be used as a sole basis for treatment. Nasal washings and aspirates are unacceptable for Xpert Xpress SARS-CoV-2/FLU/RSV testing.  Fact Sheet for Patients: EntrepreneurPulse.com.au  Fact Sheet for Healthcare Providers: IncredibleEmployment.be  This test is not yet approved or cleared by the [place] FDA and has been authorized for detection and/or diagnosis of SARS-CoV-2 by FDA under an Emergency Use Authorization (EUA). This EUA will remain in effect (meaning this test can be used) for the duration of the COVID-19 declaration under Section 564(b)(1) of the Act, 21 U.S.C. section 360bbb-3(b)(1), unless the authorization is terminated or revoked.  Performed at [facility], [address],  [place], [place] [address]     Labs: CBC: Recent Labs  Lab 08/18/21 1329  WBC 6.9  NEUTROABS 4.6  HGB 17.7*  HCT 51.7  MCV 96.6  PLT 301   Basic Metabolic Panel: Recent Labs  Lab 08/18/21 1329  NA 136  K 3.9  CL 100  CO2 28  GLUCOSE 157*  BUN 12  CREATININE 0.90  CALCIUM 9.7   Liver Function Tests: Recent Labs  Lab 08/18/21 1329  AST 21  ALT 27  ALKPHOS 102  BILITOT 0.7  PROT 8.3*  ALBUMIN 4.5   CBG: Recent Labs  Lab 08/18/21 2054 08/19/21 0739  GLUCAP 330* 216*    Discharge time spent: greater than 30 minutes.  Signed: Rodena Goldmann, DO [facility] 08/19/2021

## 2021-08-19 NOTE — Progress Notes (Signed)
SATURATION QUALIFICATIONS: (This note is used to comply with regulatory documentation for home oxygen)  Patient Saturations on Room Air at Rest = 95%  Patient Saturations on Room Air while Ambulating = 87%  Patient Saturations on 1 Liters of oxygen while Ambulating = 93%  Please briefly explain why patient needs home oxygen: Pt desats to 87% at times while ambulating, quickly regains adequate oxygen sats when taking deep breaths.

## 2021-08-22 DIAGNOSIS — Z6832 Body mass index (BMI) 32.0-32.9, adult: Secondary | ICD-10-CM | POA: Diagnosis not present

## 2021-08-22 DIAGNOSIS — J441 Chronic obstructive pulmonary disease with (acute) exacerbation: Secondary | ICD-10-CM | POA: Diagnosis not present

## 2021-08-22 DIAGNOSIS — E6609 Other obesity due to excess calories: Secondary | ICD-10-CM | POA: Diagnosis not present

## 2021-08-23 ENCOUNTER — Ambulatory Visit: Payer: Self-pay

## 2021-08-28 ENCOUNTER — Other Ambulatory Visit: Payer: Self-pay

## 2021-08-28 ENCOUNTER — Ambulatory Visit (INDEPENDENT_AMBULATORY_CARE_PROVIDER_SITE_OTHER): Payer: Medicare HMO

## 2021-08-28 ENCOUNTER — Ambulatory Visit: Payer: Medicare HMO | Admitting: Allergy & Immunology

## 2021-08-28 ENCOUNTER — Encounter: Payer: Self-pay | Admitting: Allergy & Immunology

## 2021-08-28 VITALS — BP 142/86 | HR 67 | Temp 97.9°F | Resp 16 | Ht 69.0 in | Wt 220.0 lb

## 2021-08-28 DIAGNOSIS — J449 Chronic obstructive pulmonary disease, unspecified: Secondary | ICD-10-CM

## 2021-08-28 DIAGNOSIS — J302 Other seasonal allergic rhinitis: Secondary | ICD-10-CM

## 2021-08-28 DIAGNOSIS — J4540 Moderate persistent asthma, uncomplicated: Secondary | ICD-10-CM

## 2021-08-28 DIAGNOSIS — J3089 Other allergic rhinitis: Secondary | ICD-10-CM

## 2021-08-28 NOTE — Progress Notes (Signed)
? ?FOLLOW UP ? ?Date of Service/Encounter:  08/28/21 ? ? ?Assessment:  ? 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  ?Seasonal and perennial allergic rhinitis (cat, dog, cockroach, trees, weeds, and ragweed) ?  ?Current smoker ?  ?Complex medical history, including lung cancer (detected early, treated with radiation and resection alone) ?  ?Restrictive pattern of spirometry ? ?Plan/Recommendations:  ? ?1. Asthma/COPD overlap ?- Lung testing looked decent today (better than last time).  ?- We are going to change you to Trelegy one puff once daily. ?- Continue with your Trelegy one puff once daily in addition to the oxygen at night. ?- Continue Xolair injections every 2 weeks. ?- May use albuterol 2 puffs every 4 hours as needed for cough, wheeze, tightness in chest, shortness of breath OR may use albuterol via nebulizer 1 ampoule every 4 hours as needed for cough, wheeze, tightness in chest, or shortness of breath. ?- When you come in for your next Xolair injection finger COVID-19 vaccine card so we can figure out if it is time to receive your booster ?Asthma control goals:  ?Full participation in all desired activities (may need albuterol before activity) ?Albuterol use two time or less a week on average (not counting use with activity) ?Cough interfering with sleep two time or less a month ?Oral steroids no more than once a year ?No hospitalizations ? ?2. Seasonal and perennial allergic rhinitis ?- May use an antihistamine such as Claritin, Allegra, Xyzal, or Zyrtec once a day as needed for runny nose ? ?3. Follow up as scheduled.   ? ? ?Subjective:  ? ?Kevin Frank is a 81 y.o. male presenting today for follow up of  ?Chief Complaint  ?Patient presents with  ? Asthma  ? ? ?Kevin Frank has a history of the following: ?Patient Active Problem List  ? Diagnosis Date Noted  ? Asthma exacerbation in COPD ([place]) 08/18/2021  ? Cough 07/18/2021  ? Asthma with COPD with exacerbation ([place])  07/18/2021  ? Lobar pneumonia ([place]) 12/11/2020  ? Acute respiratory failure ([place]) 12/10/2020  ? Wrist pain, acute, right 09/07/2019  ? Right forearm pain 09/07/2019  ? Uncontrolled type 2 diabetes mellitus with hyperglycemia ([place]) 06/27/2019  ? Mixed hyperlipidemia 06/27/2019  ? Class 1 obesity due to excess calories with serious comorbidity and body mass index (BMI) of 32.0 to 32.9 in adult 06/27/2019  ? Diabetes mellitus without complication ([place]) 31/51/7616  ? Positive colorectal cancer screening using Cologuard test 06/14/2019  ? 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 Seasonal and perennial allergic rhinitis 09/22/2017  ? Asthma-COPD overlap syndrome ([place]) 09/22/2017  ? Obstructive sleep apnea on CPAP 04/23/2017  ? Chronic knee pain 04/23/2017  ? AKI (acute kidney injury) ([place]) 04/23/2017  ? Chronic diastolic CHF (congestive heart failure) ([place]) 04/23/2017  ? Cellulitis 04/23/2017  ? Cellulitis of left lower leg   ? Current smoker 03/05/2016  ? Incidental lung nodule, > 40mm and < 64mm 03/05/2016  ? Stage 2 moderate COPD by GOLD classification ([place]) 03/05/2016  ? AAA (abdominal aortic aneurysm) without rupture 06/14/2015  ? Tobacco use 06/09/2007  ? OBSTRUCTIVE SLEEP APNEA 06/09/2007  ? HYPERCHOLESTEROLEMIA 05/06/2007  ? GOUT 05/06/2007  ? DEPRESSION 05/06/2007  ? Essential hypertension, benign 05/06/2007  ? ABDOMINAL AORTIC ANEURYSM 05/06/2007  ? BENIGN PROSTATIC HYPERTROPHY, HX OF 05/06/2007  ? ? ?History obtained from: chart review and patient and his daughter . ? ?Kevin Frank is a 81 y.o. male presenting  for a follow up visit.  He was last seen in October 2022.  At that time, he did not look great.  We gave him a Depo-Medrol shot and started him on prednisone and Levaquin.  We continued with Breztri 2 puffs twice daily as well as his oxygen at night.  He was also on Xolair every 2 weeks.  We called in the week after that he had markedly improved. ? ?In the interim, he was admitted to the hospital  with mild acute hypoxemic respiratory failure. He was discharged on Breztri 2 puffs twice daily as well as a prednisone taper.  He was also discharged on oxygen 1 L/min. ? ?Since discharge from the hospital, he has continued to improve. ? 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  He has not been great about using it consistently.  He was certainly much better with compliance when he was on the Trelegy.  He is open to changing back to the Trelegy.  He feels very good right now.  He last used albuterol a couple of days ago. He has an oxygen concentrator at home and has tubing to get up to every room of the house.  He has 1 in his truck, but he does not use it.  ? ?Allergic Rhinitis Symptom History: He remains on the Zyrtec once a day.  He does not use a nose spray. ? ?Otherwise, there have been no changes to his past medical history, surgical history, family history, or social history. ? ? ? ?Review of Systems  ?Constitutional: Negative.  Negative for chills, fever, malaise/fatigue and weight loss.  ?HENT:  Positive for congestion. Negative for ear discharge, ear pain and sinus pain.   ?Eyes:  Negative for pain, discharge and redness.  ?Respiratory:  Positive for cough. Negative for sputum production, shortness of breath and wheezing.   ?Cardiovascular: Negative.  Negative for chest pain and palpitations.  ?Gastrointestinal:  Negative for abdominal pain, constipation, diarrhea, heartburn, nausea and vomiting.  ?Skin: Negative.  Negative for itching and rash.  ?Neurological:  Negative for dizziness and headaches.  ?Endo/Heme/Allergies:  Negative for environmental allergies. Does not bruise/bleed easily.   ? ? ? ?Objective:  ? ?Blood pressure (!) 142/86, pulse 67, temperature 97.9 ?F (36.6 ?C), resp. rate 16, height 5\' 9"  (1.753 m), weight 220 lb 0.3 oz (99.8 kg), SpO2 92 %. ?Body mass index is 32.49 kg/m?. ? ? ? ?Physical Exam ?Vitals reviewed.  ?Constitutional:   ?    Appearance: He is well-developed.  ?   Comments: Back to his boisterous self.  Very sassy.  ?HENT:  ?   Head: Normocephalic and atraumatic.  ?   Right Ear: Tympanic membrane, ear canal and external ear normal.  ?   Left Ear: Tympanic membrane, ear canal and external ear normal.  ?   Nose: No nasal deformity, septal deviation, mucosal edema or rhinorrhea.  ?   Right Turbinates: Enlarged, swollen and pale.  ?   Left Turbinates: Enlarged, swollen and pale.  ?   Right Sinus: No maxillary sinus tenderness or frontal sinus tenderness.  ?   Left Sinus: No maxillary sinus tenderness or frontal sinus tenderness.  ?   Mouth/Throat:  ?   Mouth: Mucous membranes are not pale and dry.  ?   Pharynx: Uvula midline.  ?Eyes:  ?   General: Lids are normal. No allergic shiner.    ?   Right eye: No discharge.     ?  Left eye: No discharge.  ?   Conjunctiva/sclera: Conjunctivae normal.  ?   Right eye: Right conjunctiva is not injected. No chemosis. ?   Left eye: Left conjunctiva is not injected. No chemosis. ?   Pupils: Pupils are equal, round, and reactive to light.  ?Cardiovascular:  ?   Rate and Rhythm: Normal rate and regular rhythm.  ?   Heart sounds: Normal heart sounds.  ?Pulmonary:  ?   Effort: Pulmonary effort is normal. No tachypnea, accessory muscle usage or respiratory distress.  ?   Breath sounds: No wheezing, rhonchi or rales.  ?   Comments: His lungs actually sound fairly clear all things considered. ?Chest:  ?   Chest wall: No tenderness.  ?Lymphadenopathy:  ?   Cervical: No cervical adenopathy.  ?Skin: ?   Coloration: Skin is not pale.  ?   Findings: No abrasion, erythema, petechiae or rash. Rash is not papular, urticarial or vesicular.  ?Neurological:  ?   Mental Status: He is alert.  ?Psychiatric:     ?   Behavior: Behavior is cooperative.  ?  ? ?Diagnostic studies:  ? ?Spirometry: results abnormal (FEV1: 1.29/66%, FVC: 2.51/98%, FEV1/FVC: 51%).  ?  ?Spirometry consistent with moderate obstructive disease. ? ?Allergy  Studies: none ? ? ? ?  ?Salvatore Marvel, MD  ?[facility] ? ? ? ? ? ? ?

## 2021-08-28 NOTE — Patient Instructions (Signed)
1. Asthma/COPD overlap ?- Lung testing looked decent today (better than last time).  ?- We are going to change you to Trelegy one puff once daily. ?- Continue with your Trelegy one puff once daily in addition to the oxygen at night. ? ?- Continue Xolair injections every 2 weeks. ?- May use albuterol 2 puffs every 4 hours as needed for cough, wheeze, tightness in chest, shortness of breath OR may use albuterol via nebulizer 1 ampoule every 4 hours as needed for cough, wheeze, tightness in chest, or shortness of breath. ?- When you come in for your next Xolair injection finger COVID-19 vaccine card so we can figure out if it is time to receive your booster ?Asthma control goals:  ?Full participation in all desired activities (may need albuterol before activity) ?Albuterol use two time or less a week on average (not counting use with activity) ?Cough interfering with sleep two time or less a month ?Oral steroids no more than once a year ?No hospitalizations ? ?2. Seasonal and perennial allergic rhinitis ?- May use an antihistamine such as Claritin, Allegra, Xyzal, or Zyrtec once a day as needed for runny nose ? ?3. Follow up as scheduled.   ? ? ?Please inform us of any Emergency Department visits, hospitalizations, or changes in symptoms. Call us before going to the ED for breathing or allergy symptoms since we might be able to fit you in for a sick visit. Feel free to contact us anytime with any questions, problems, or concerns. ? ?It was a pleasure to see you and your family again today!  ? ?Websites that have reliable patient information: ?1. American Academy of Asthma, Allergy, and Immunology: www.aaaai.org ?2. Food Allergy Research and Education (FARE): foodallergy.org ?3. Mothers of Asthmatics: http://www.asthmacommunitynetwork.org ?4. [employer] of Allergy, Asthma, and Immunology: MonthlyElectricBill.co.uk ? ? ?COVID-19 Vaccine Information can be found at:  ShippingScam.co.uk For questions related to vaccine distribution or appointments, please email [email or web address] or call [phone].  ? ?We realize that you might be concerned about having an allergic reaction to the COVID19 vaccines. To help with that concern, WE ARE OFFERING THE COVID19 VACCINES IN OUR OFFICE! Ask the front desk for dates!  ? ? ? ??Like? [place] on Facebook and Instagram for our latest updates!  ?  ? ? ?A healthy democracy works best when [employer] participate! Make sure you are registered to vote! If you have moved or changed any of your contact information, you will need to get this updated before voting! ? ?In some cases, you MAY be able to register to vote online: [email or web address] ? ? ? ? ? ? ? ? ? ?

## 2021-08-29 ENCOUNTER — Ambulatory Visit: Payer: Medicare HMO | Admitting: Urology

## 2021-08-29 NOTE — Progress Notes (Deleted)
? ?Assessment: ?1. Incomplete bladder emptying   ?2. Elevated PSA   ? ? ? ? ?Plan: ?The patient's outside records were reviewed including recent laboratory results.  I discussed the recent PSA results.  I advised him that his PSA is normal for his age.  I also discussed the recommendations for prostate cancer screening and that typically screening is not recommended after the age of 71. ?He does not have any significant urinary symptoms; however, his bladder scan did indicate incomplete emptying. ?Recommend timed and double voiding to ensure adequate bladder emptying. ?Return to office in 6 weeks with bladder scan. ? ?Chief Complaint:  ?No chief complaint on file. ? ? ?History of Present Illness: ? ?Kevin Frank is a 81 y.o. year old male who is seen for further evaluation of elevated PSA. The pt has no h/o prostate CA and reports no urinary c/o. He did have TURP in 2012.  No symptoms of frequency, urgency, incomplete emptying, weakness of stream, straining. No hematuria. No h/o stones. Occasional intermittency. No nocturia.  ?IPPS= 1. QOL- 1 ?PVR= 259 ml ?PSA from 07/09/2021 was 4.1. ?No history of elevated PSA.  No prior prostate biopsy. ?No history of UTIs. ? ?Portions of the above documentation were copied from a prior visit for review purposes only. ? ? ?Past Medical History:  ?Past Medical History:  ?Diagnosis Date  ? AAA (abdominal aortic aneurysm) 01/2008  ? 3.2cm  ? Allergy   ? Arthritis   ? Asthma   ? BPH (benign prostatic hyperplasia)   ? Chronic knee pain   ? bilateral  ? COPD (chronic obstructive pulmonary disease) ([place])   ? Diabetes mellitus   ? type II  ? Gout   ? History of kidney stones   ? Hypercholesterolemia   ? Hyperlipidemia   ? Hypertension   ? Lung cancer ([place]) 2019  ? 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 Normal nuclear stress test 2011  ? Obesity   ? Obstructive sleep apnea on CPAP   ? Presence of indwelling urinary catheter   ? Shortness of breath   ? due to  cigarette abuse  ? Tobacco abuse   ? ? ?Past Surgical History:  ?Past Surgical History:  ?Procedure Laterality Date  ? ABDOMINAL AORTIC ENDOVASCULAR FENESTRATED STENT GRAFT N/A 04/09/2016  ? Procedure: ABDOMINAL AORTIC ENDOVASCULAR FENESTRATED STENT GRAFT with left brachial artery access using ultrasound guidance;  Surgeon: Serafina Mitchell, MD;  Location: [facility];  Service: Vascular;  Laterality: N/A;  ? [place]  ? [place]  ? COLONOSCOPY  2006  ? 2006: multiple diminutive polyps in rectum, one pedunculated polyp at 10 cm s/p snare removal. Left-sided diverticulum, pedunculated polyps at 35 cm and splenic flexure s/p snare removal. No path available.   ? COLONOSCOPY WITH PROPOFOL N/A 11/24/2019  ? Procedure: COLONOSCOPY WITH PROPOFOL;  Surgeon: Daneil Dolin, MD;  [facility];  Service: Endoscopy;  Laterality: N/A;  9:45am  ? PERIPHERAL VASCULAR CATHETERIZATION N/A 04/10/2016  ? Procedure: Renal Angiography;  Surgeon: Angelia Mould, MD;  Location: [facility];  Service: Cardiovascular;  Laterality: N/A;  ? POLYPECTOMY  11/24/2019  ? Procedure: POLYPECTOMY;  Surgeon: Daneil Dolin, MD;  [facility];  Service: Endoscopy;;  ? TRANSURETHRAL RESECTION OF PROSTATE  01/07/2011  ? Procedure: TRANSURETHRAL RESECTION OF THE PROSTATE (TURP);  Surgeon: Marissa Nestle;  Location: [facility];  Service: Urology;  Laterality: N/A;  ? ? ?  Allergies:  ?Allergies  ?Allergen Reactions  ? No Known Allergies   ? ? ?Family History:  ?Family History  ?Problem Relation Age of Onset  ? Liver disease Father   ? Lymphoma Mother   ? Diabetes Mother   ? Hypertension Mother   ? Cancer Mother   ? Mental retardation Other   ?     sibling -- from birth   ? Asthma Daughter   ? Allergic rhinitis Neg Hx   ? Angioedema Neg Hx   ? Atopy Neg Hx   ? Eczema Neg Hx   ? Immunodeficiency Neg Hx   ? Colon cancer Neg Hx   ? ? ?Social History:  ?Social History  ? ?Tobacco Use  ? Smoking status: Every Day  ?  Packs/day:  3.00  ?  Years: 50.00  ?  Pack years: 150.00  ?  Types: Cigarettes  ? Smokeless tobacco: Never  ? Tobacco comments:  ?  2ppd 10/30/2016 ee  ?Vaping Use  ? Vaping Use: Never used  ?Substance Use Topics  ? Alcohol use: Yes  ?  Alcohol/week: 0.0 standard drinks  ?  Comment: very little  ? Drug use: No  ? ? ?ROS: ?Constitutional:  Negative for fever, chills, weight loss ?CV: Negative for chest pain, previous MI, hypertension ?Respiratory:  Negative for shortness of breath, wheezing, sleep apnea, frequent cough ?GI:  Negative for nausea, vomiting, bloody stool, GERD ? ?Physical exam: ?There were no vitals taken for this visit. ?*** ? ?Results: ?U/A:   ?

## 2021-09-13 ENCOUNTER — Ambulatory Visit (INDEPENDENT_AMBULATORY_CARE_PROVIDER_SITE_OTHER): Payer: Medicare HMO

## 2021-09-13 ENCOUNTER — Other Ambulatory Visit: Payer: Self-pay

## 2021-09-13 DIAGNOSIS — J4540 Moderate persistent asthma, uncomplicated: Secondary | ICD-10-CM | POA: Diagnosis not present

## 2021-09-30 ENCOUNTER — Ambulatory Visit (INDEPENDENT_AMBULATORY_CARE_PROVIDER_SITE_OTHER): Payer: Medicare HMO

## 2021-09-30 DIAGNOSIS — J4540 Moderate persistent asthma, uncomplicated: Secondary | ICD-10-CM

## 2021-10-07 ENCOUNTER — Encounter: Payer: Self-pay | Admitting: Surgery

## 2021-10-07 ENCOUNTER — Ambulatory Visit (HOSPITAL_COMMUNITY)
Admission: RE | Admit: 2021-10-07 | Discharge: 2021-10-07 | Disposition: A | Discharge status: Home / Self Care | Payer: Medicare HMO | Source: Ambulatory Visit | Attending: Surgery | Admitting: Surgery

## 2021-10-07 ENCOUNTER — Ambulatory Visit (INDEPENDENT_AMBULATORY_CARE_PROVIDER_SITE_OTHER)
Admission: RE | Admit: 2021-10-07 | Discharge: 2021-10-07 | Disposition: A | Discharge status: Home / Self Care | Payer: Medicare HMO | Source: Ambulatory Visit | Attending: Surgery | Admitting: Surgery

## 2021-10-07 ENCOUNTER — Ambulatory Visit: Payer: Medicare HMO | Admitting: Surgery

## 2021-10-07 VITALS — BP 127/68 | HR 83 | Temp 98.4°F | Resp 20 | Ht 69.0 in | Wt 225.0 lb

## 2021-10-07 DIAGNOSIS — I714 Abdominal aortic aneurysm, without rupture, unspecified: Secondary | ICD-10-CM | POA: Insufficient documentation

## 2021-10-07 DIAGNOSIS — I7143 Infrarenal abdominal aortic aneurysm, without rupture: Secondary | ICD-10-CM | POA: Diagnosis not present

## 2021-10-07 NOTE — Progress Notes (Signed)
? ?[facility] ? ?Patient name: Kevin Frank MRN: [id number] DOB: [birth year] Sex: male ? ? ?REASON FOR VISIT:  ? ? ?Follow-up ? ?[name]:  ? ?Kevin Frank is a 81 y.o. 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  The day after his procedure, his creatinine bumped and ultrasound did not show good perfusion to the unstented left kidney.  Therefore he went to the Cath Lab and have the left renal artery stented.  His creatinine trended down following that.  He had a CT scan performed on 08/18/2021 for blunt trauma which did not show any complications from his care.  His carotid duplex showed less than 40% stenosis (preop) ? ? ?He is back today for follow up.  He has no complaints. ?  ?He is medically managed for type 2 diabetes as well as hypertension.  He takes a statin for hypercholesterolemia.  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 abuse ? ? ?PAST MEDICAL HISTORY:  ? ?Past Medical History:  ?Diagnosis Date  ? AAA (abdominal aortic aneurysm) ([place]) 01/2008  ? 3.2cm  ? Allergy   ? Arthritis   ? Asthma   ? BPH (benign prostatic hyperplasia)   ? Chronic knee pain   ? bilateral  ? COPD (chronic obstructive pulmonary disease) ([place])   ? Diabetes mellitus   ? type II  ? Gout   ? History of kidney stones   ? Hypercholesterolemia   ? Hyperlipidemia   ? Hypertension   ? Lung cancer ([place]) 2019  ? 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 Normal nuclear stress test 2011  ? Obesity   ? Obstructive sleep apnea on CPAP   ? Presence of indwelling urinary catheter   ? Shortness of breath   ? due to cigarette abuse  ? Tobacco abuse   ? ? ? ?FAMILY HISTORY:  ? ?Family History  ?Problem Relation Age of Onset  ? Liver disease Father   ? Lymphoma Mother   ? Diabetes Mother   ? Hypertension Mother   ? Cancer Mother   ? Mental retardation Other   ?     sibling -- from birth   ? Asthma  Daughter   ? Allergic rhinitis Neg Hx   ? Angioedema Neg Hx   ? Atopy Neg Hx   ? Eczema Neg Hx   ? Immunodeficiency Neg Hx   ? Colon cancer Neg Hx   ? ? ?SOCIAL HISTORY:  ? ?Social History  ? ?Tobacco Use  ? Smoking status: Every Day  ?  Packs/day: 2.00  ?  Years: 50.00  ?  Pack years: 100.00  ?  Types: Cigarettes  ? Smokeless tobacco: Never  ? Tobacco comments:  ?  2ppd 10/30/2016 ee  ?Substance Use Topics  ? Alcohol use: Yes  ?  Alcohol/week: 0.0 standard drinks  ?  Comment: very little  ? ? ? ?ALLERGIES:  ? ?Allergies  ?Allergen Reactions  ? No Known Allergies   ? ? ? ?CURRENT MEDICATIONS:  ? ?Current Outpatient Medications  ?Medication Sig Dispense Refill  ? albuterol (PROVENTIL) (2.5 MG/3ML) 0.083% nebulizer solution Take 3 mLs (2.5 mg total) by nebulization every 6 (six) hours as needed for wheezing or shortness of breath. 150 mL 2  ? albuterol (VENTOLIN HFA) 108 (90 Base) MCG/ACT inhaler Use 4 puffs every 4-6 hours as needed for cough or wheeze 18 g  2  ? amLODipine (NORVASC) 10 MG tablet Take 1 tablet (10 mg total) by mouth daily. 14 tablet 0  ? atorvastatin (LIPITOR) 20 MG tablet Take 1 tablet (20 mg total) by mouth daily. 90 tablet 1  ? Budeson-Glycopyrrol-Formoterol (BREZTRI AEROSPHERE) 160-9-4.8 MCG/ACT AERO Inhale 2 puffs into the lungs in the morning and at bedtime. 10.7 g 5  ? clopidogrel (PLAVIX) 75 MG tablet Take 1 tablet (75 mg total) by mouth daily. 30 tablet 1  ? hydrALAZINE (APRESOLINE) 50 MG tablet Take 1 tablet (50 mg total) by mouth in the morning and at bedtime.    ? losartan (COZAAR) 100 MG tablet TAKE 1 TABLET BY MOUTH ONCE DAILY. (Patient taking differently: Take 100 mg by mouth daily.) 30 tablet 0  ? metoprolol succinate (TOPROL-XL) 25 MG 24 hr tablet Take 1 tablet (25 mg total) by mouth daily. 14 tablet 0  ? sertraline (ZOLOFT) 50 MG tablet Take 1 tablet (50 mg total) by mouth daily. 30 tablet 1  ? torsemide (DEMADEX) 20 MG tablet Take 1 tablet (20 mg total) by mouth daily.    ?  allopurinol (ZYLOPRIM) 300 MG tablet Take 1 tablet (300 mg total) by mouth daily. (Patient not taking: Reported on 08/30/2021) 30 tablet 1  ? indomethacin (INDOCIN) 50 MG capsule Take by mouth. (Patient not taking: Reported on 08/30/2021)    ? JARDIANCE 10 MG TABS tablet Take 10 mg by mouth daily. (Patient not taking: Reported on 08/30/2021)    ? metFORMIN (GLUCOPHAGE) 500 MG tablet Take 2 tablets (1,000 mg total) by mouth 2 (two) times daily with a meal. 120 tablet 0  ? ?Current Facility-Administered Medications  ?Medication Dose Route Frequency Provider Last Rate Last Admin  ? levalbuterol (XOPENEX) nebulizer solution 1.25 mg  1.25 mg Nebulization Once Gean Quint, MD      ? omalizumab Arvid Right) prefilled syringe 375 mg  375 mg Subcutaneous Q14 Days Valentina Shaggy, MD   375 mg at 09/30/21 1046  ? ? ?REVIEW OF SYSTEMS:  ? ?[X]  denotes positive finding, [ ]  denotes negative finding ?Cardiac  Comments:  ?Chest pain or chest pressure:    ?Shortness of breath upon exertion:    ?Short of breath when lying flat:    ?Irregular heart rhythm:    ?    ?Vascular    ?Pain in calf, thigh, or hip brought on by ambulation:    ?Pain in feet at night that wakes you up from your sleep:     ?Blood clot in your veins:    ?Leg swelling:     ?    ?Pulmonary    ?Oxygen at home:    ?Productive cough:     ?Wheezing:     ?    ?Neurologic    ?Sudden weakness in arms or legs:     ?Sudden numbness in arms or legs:     ?Sudden onset of difficulty speaking or slurred speech:    ?Temporary loss of vision in one eye:     ?Problems with dizziness:     ?    ?Gastrointestinal    ?Blood in stool:     ?Vomited blood:     ?    ?Genitourinary    ?Burning when urinating:     ?Blood in urine:    ?    ?Psychiatric    ?Major depression:     ?    ?Hematologic    ?Bleeding problems:    ?Problems with blood clotting too easily:    ?    ?  Skin    ?Rashes or ulcers:    ?    ?Constitutional    ?Fever or chills:    ? ? ?PHYSICAL EXAM:  ? ?Vitals:  ? 10/07/21  0944  ?BP: 127/68  ?Pulse: 83  ?Resp: 20  ?Temp: 98.4 ?F (36.9 ?C)  ?SpO2: (!) 88%  ?Weight: 225 lb (102.1 kg)  ?Height: 5\' 9"  (1.753 m)  ? ? ?GENERAL: The patient is a well-nourished male, in no acute distress. The vital signs are documented above. ?CARDIAC: There is a regular rate and rhythm.  ?VASCULAR: Palpable pedal pulses ?PULMONARY: Non-labored respirations ?ABDOMEN: Soft and non-tender  ?MUSCULOSKELETAL: There are no major deformities or cyanosis. ?NEUROLOGIC: No focal weakness or paresthesias are detected. ?SKIN: There are no ulcers or rashes noted. ?PSYCHIATRIC: The patient has a normal affect. ? ?STUDIES:  ? ?I have reviewed the following: ?Renal: ?Renal:  ?   ?Right: Normal size right kidney. RRV flow present.  ?Left:  Normal size of left kidney. LRV flow present.  ?   ?       - Bilateral renal artery stents were unable to be visualized,  ?       however, the renal arteries appear patent bilaterally.  ?   ?Aorta: ?Abdominal Aorta: The largest aortic measurement is 3.9 cm. Very limited  ?visualization. The largest aortic diameter remains essentially unchanged  ?compared to prior exam. Previous diameter measurement was 3.9 cm.  ?MEDICAL ISSUES:  ? ?Status post fenestrated endovascular aneurysm repair.  Ultrasound today show widely patent renal arteries.  Maximum aortic diameter is 3.9 cm without endoleak.  He will continue with surveillance.  His neck studies will be in 1 year. ? ? ? ?Annamarie Major, IV, MD, FACS ?[facility] ?Tel [phone] ?Pager [phone]  ?

## 2021-10-16 ENCOUNTER — Encounter: Payer: Self-pay | Admitting: Family

## 2021-10-16 ENCOUNTER — Ambulatory Visit: Payer: Medicare HMO | Admitting: Podiatry

## 2021-10-16 ENCOUNTER — Telehealth: Payer: Self-pay

## 2021-10-16 ENCOUNTER — Ambulatory Visit: Payer: Medicare HMO | Admitting: Family

## 2021-10-16 ENCOUNTER — Ambulatory Visit: Payer: Medicare HMO

## 2021-10-16 VITALS — BP 130/86 | HR 83 | Temp 98.0°F | Resp 16 | Ht 69.0 in

## 2021-10-16 DIAGNOSIS — J4540 Moderate persistent asthma, uncomplicated: Secondary | ICD-10-CM

## 2021-10-16 DIAGNOSIS — J3089 Other allergic rhinitis: Secondary | ICD-10-CM

## 2021-10-16 DIAGNOSIS — J449 Chronic obstructive pulmonary disease, unspecified: Secondary | ICD-10-CM

## 2021-10-16 DIAGNOSIS — F17200 Nicotine dependence, unspecified, uncomplicated: Secondary | ICD-10-CM

## 2021-10-16 DIAGNOSIS — J302 Other seasonal allergic rhinitis: Secondary | ICD-10-CM

## 2021-10-16 MED ORDER — EPINEPHRINE 0.3 MG/0.3ML IJ SOAJ: 0.3000 mg | INTRAMUSCULAR | 1 refills | Status: DC | PRN

## 2021-10-16 NOTE — Patient Instructions (Addendum)
1. Asthma/COPD overlap ?Start prednisone 10 mg taking 1 tablet twice a day for 4 days, then on the fifth day take 1 tablet and stop.  Verbally instructed [name] and his wife to check his blood sugar while he was on the prednisone.  If he continues to not feel well recommend giving her office a call or contacting your primary care physician. ?-Recommend decreasing smoking ?- Continue with your Trelegy one puff once daily in addition to the oxygen at night. ? ?- Continue Xolair injections every 2 weeks.  Refill of EpiPen sent.  Demonstration given ?- May use albuterol 2 puffs every 4 hours as needed for cough, wheeze, tightness in chest, shortness of breath OR may use albuterol via nebulizer 1 ampoule every 4 hours as needed for cough, wheeze, tightness in chest, or shortness of breath. ?- When you come in for your next Xolair injection finger COVID-19 vaccine card so we can figure out if it is time to receive your booster ?Asthma control goals:  ?Full participation in all desired activities (may need albuterol before activity) ?Albuterol use two time or less a week on average (not counting use with activity) ?Cough interfering with sleep two time or less a month ?Oral steroids no more than once a year ?No hospitalizations ? ?2. Seasonal and perennial allergic rhinitis ?- May use an antihistamine such as Claritin, Allegra, Xyzal, or Zyrtec once a day as needed for runny nose ? ?3.keep already scheduled appointment with Dr. Ernst Bowler on November 29, 2021 at 10:15 AM ? ? ? ? ? ? ? ?

## 2021-10-16 NOTE — Telephone Encounter (Signed)
ATC patient to ask if patient could bring CPAP SD Card. Patients wife answered and said patient is bringing in whole CPAP to appt. Nothing further needed.  ?

## 2021-10-16 NOTE — Progress Notes (Signed)
? ?[address], [place] [address] ?Dept: [phone] ? ?FOLLOW UP NOTE ? ?Patient ID: Kevin Frank, male    DOB: Dec 25, 1940  Age: 81 y.o. MRN: [id number] ?Date of Office Visit: 10/16/2021 ? ?Assessment  ?Chief Complaint: Asthma and Other (Was not feeling well this morning ) ? ?HPI ?Kevin Frank is an 81 year old male who presents today for an acute visit  of "dragging for 3 to 4-days."  He was last seen on August 28, 2021 by Dr. Ernst Frank for asthma-COPD overlap syndrome improved on Xolair every 14 days, seasonal and perennial allergic rhinitis, current smoker, and complex medical history-including lung cancer (detected early, treated with radiation, and resection alone).  Since his last office visit he denies any new diagnosis or surgeries.  His wife is here with him today and helps provide history. ? ?Asthma/COPD overlap syndrome is reported as moderately controlled with Trelegy 1 puff once a day, Xolair injections every 2 weeks, and albuterol as needed.  His wife does feel like his breathing is worse.  He reports a productive cough that is light yellow in color and at times clear in color.  He feels that this discolored sputum is normal for him.  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  He denies wheezing, tightness in chest, and nocturnal awakenings due to breathing problems.  He does wear his CPAP with oxygen at night.  Since his last office visit he has not required any systemic steroids or made any trips to the emergency room or urgent care due to breathing problems.  His wife reports that yesterday his temperature was 98.3.  He denies any fever or chills.  He has had 2 COVID-19 vaccines and they do not plan to get any others.  He continues to smoke 2 to 3 packs a day. ? ?Seasonal and perennial allergic rhinitis is reported as moderately controlled with no medications at this time.  He reports clear  rhinorrhea every now and then and postnasal drip at times.  He denies nasal congestion.  He has not had any sinus infections since we last saw him. ? ?His wife has concerns about the scab under his left eye.  Offered to refer him to dermatology, but they report that he sees a dermatologist ? ? ?Drug Allergies:  ?Allergies  ?Allergen Reactions  ? No Known Allergies   ? ? ?Review of Systems: ?Review of Systems  ?Constitutional:  Positive for malaise/fatigue. Negative for chills and fever.  ?     His wife reports that he has been "dragging for 3 to 4 days"  ?HENT:    ?     Reports clear rhinorrhea every now and then and postnasal drip at times.  Denies nasal congestion  ?Eyes:   ?     Denies itchy watery eyes  ?Respiratory:  Positive for cough and shortness of breath. Negative for wheezing.   ?     Reports productive cough that can be clear to light yellow.  Also reports shortness of breath with exertion.  His wife reports that he wants something to hold onto they go to [place].  Denies wheezing, tightness in chest, and nocturnal awakenings  ?Cardiovascular:  Negative for chest pain and palpitations.  ?Gastrointestinal:   ?     Denies heartburn or reflux symptoms  ?Genitourinary:   ?     Denies hesitancy in starting urination  ?Skin:  Negative for itching and rash.  ?Neurological:  Negative for headaches.  ?Endo/Heme/Allergies:  Positive for environmental allergies.  ? ? ?Physical Exam: ?BP 130/86   Pulse 83   Temp 98 ?F (36.7 ?C)   Resp 16   Ht 5\' 9"  (1.753 m)   SpO2 93%   BMI 33.23 kg/m?   ? ?Physical Exam ?Exam conducted with a chaperone present.  ?Constitutional:   ?   Appearance: Normal appearance.  ?HENT:  ?   Head: Normocephalic and atraumatic.  ?   Comments:  Pharynx normal, eyes normal, ears: Unable to see right tympanic membrane due to cerumen.  Left ear normal, nose: Bilateral lower turbinates moderately edematous and slightly erythematous with no drainage noted ?   Right Ear: Tympanic membrane, ear canal  and external ear normal.  ?   Left Ear: Tympanic membrane, ear canal and external ear normal.  ?   Mouth/Throat:  ?   Mouth: Mucous membranes are moist.  ?   Pharynx: Oropharynx is clear.  ?Eyes:  ?   Conjunctiva/sclera: Conjunctivae normal.  ?Cardiovascular:  ?   Rate and Rhythm: Regular rhythm.  ?Pulmonary:  ?   Comments: Wheezing heard throughout all lung fields ?Musculoskeletal:  ?   Cervical back: Neck supple.  ?Skin: ?   General: Skin is warm.  ?   Comments: Scab noted under left eye  ?Neurological:  ?   Mental Status: He is alert and oriented to person, place, and time.  ?Psychiatric:     ?   Mood and Affect: Mood normal.     ?   Behavior: Behavior normal.     ?   Thought Content: Thought content normal.     ?   Judgment: Judgment normal.  ? ? ?Diagnostics: ?FVC 2.21 L (57%), FEV1 1.37 L (48%).  Predicted FVC 3.87 L, predicted FEV1 2.85 L.  Spirometry indicates possible severe restriction. ? 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 Current smoker   ?3. Seasonal and perennial allergic rhinitis   ? ? ?Meds ordered this encounter  ?Medications  ? EPINEPHrine 0.3 mg/0.3 mL IJ SOAJ injection  ?  Sig: Inject 0.3 mg into the muscle as needed for anaphylaxis.  ?  Dispense:  1 each  ?  Refill:  1  ? ? ?Patient Instructions  ?1. Asthma/COPD overlap ?Start prednisone 10 mg taking 1 tablet twice a day for 4 days, then on the fifth day take 1 tablet and stop.  Verbally instructed Kevin Frank and his wife to check his blood sugar while he was on the prednisone.  If he continues to not feel well recommend giving her office a call or contacting your primary care physician. ?-Recommend decreasing smoking ?- Continue with your Trelegy one puff once daily in addition to the oxygen at night. ? ?- Continue Xolair injections every 2 weeks.  Refill of EpiPen sent.  Demonstration given ?- May use albuterol 2 puffs every 4 hours as needed for cough, wheeze, tightness in chest, shortness of breath OR may use  albuterol via nebulizer 1 ampoule every 4 hours as needed for cough, wheeze, tightness in chest, or shortness of breath. ?- When you come in for your next Xolair injection finger COVID-19 vaccine card so we can figure out if it is time to receive your booster ?Asthma control goals:  ?Full participation in all desired activities (may need albuterol before activity) ?Albuterol use two time or less a week on average (not counting use with activity) ?  Cough interfering with sleep two time or less a month ?Oral steroids no more than once a year ?No hospitalizations ? ?2. Seasonal and perennial allergic rhinitis ?- May use an antihistamine such as Claritin, Allegra, Xyzal, or Zyrtec once a day as needed for runny nose ? ?3.keep already scheduled appointment with Dr. Ernst Frank on November 29, 2021 at 10:15 AM ? ? ? ? ? ? ? ? ?Return in about 6 weeks (around 11/29/2021), or if symptoms worsen or fail to improve. ?  ? ?Thank you for the opportunity to care for this patient.  Please do not hesitate to contact me with questions. ? ?Althea Charon, FNP ?[facility] ? ? ? ? ?

## 2021-10-17 ENCOUNTER — Ambulatory Visit: Payer: Medicare HMO | Admitting: Pulmonary Disease

## 2021-10-17 ENCOUNTER — Encounter: Payer: Self-pay | Admitting: Pulmonary Disease

## 2021-10-17 VITALS — BP 120/70 | HR 78 | Temp 97.9°F | Ht 70.0 in | Wt 229.8 lb

## 2021-10-17 DIAGNOSIS — G4734 Idiopathic sleep related nonobstructive alveolar hypoventilation: Secondary | ICD-10-CM

## 2021-10-17 DIAGNOSIS — J449 Chronic obstructive pulmonary disease, unspecified: Secondary | ICD-10-CM | POA: Diagnosis not present

## 2021-10-17 DIAGNOSIS — G4733 Obstructive sleep apnea (adult) (pediatric): Secondary | ICD-10-CM

## 2021-10-17 DIAGNOSIS — Z9989 Dependence on other enabling machines and devices: Secondary | ICD-10-CM | POA: Diagnosis not present

## 2021-10-17 DIAGNOSIS — Z720 Tobacco use: Secondary | ICD-10-CM

## 2021-10-17 MED ORDER — TRELEGY ELLIPTA 100-62.5-25 MCG/ACT IN AEPB: 1.0000 | INHALATION_SPRAY | Freq: Every day | RESPIRATORY_TRACT | 0 refills | Status: DC

## 2021-10-17 MED ORDER — ALBUTEROL SULFATE HFA 108 (90 BASE) MCG/ACT IN AERS: INHALATION_SPRAY | RESPIRATORY_TRACT | 2 refills | Status: DC

## 2021-10-17 NOTE — Patient Instructions (Signed)
Call and let us know what your home oxygen system is set at ? ?Will have [employer] arrange for a new auto CPAP machine ? ?Trelegy one puff daily and rinse your mouth after each use ? ?Stop using dulera ? ?Albuterol every 6 hours as needed for cough, wheeze, chest congestion, or shortness of breath ? ?Follow up in 4 months ?

## 2021-10-17 NOTE — Progress Notes (Signed)
? ?[facility] ? ?Chief Complaint  ?Patient presents with  ? Consult  ?  Patient wears CPAP already, sleeps with it everynight. No concerns  ? ? ?Past Surgical History:  ?He  has a past surgical history that includes Cholecystectomy (1997); Transurethral resection of prostate (01/07/2011); Cardiac catheterization (N/A, 04/10/2016); Abdominal aortic endovascular fenestrated stent graft (N/A, 04/09/2016); Colonoscopy (2006); Colonoscopy with propofol (N/A, 11/24/2019); and polypectomy (11/24/2019). ? ?Past Medical History:  ?AAA, BPH, Osteoarthritis, DM type 2, Gout, Nephrolithiasis, HLD, HTN, Lung cancer 2019 ? ?Constitutional:  ?BP 120/70 (BP Location: Left Arm, Patient Position: Sitting, Cuff Size: Normal)   Pulse 78   Temp 97.9 ?F (36.6 ?C) (Oral)   Ht 5\' 10"  (1.778 m)   Wt 229 lb 12.8 oz (104.2 kg)   SpO2 92%   BMI 32.97 kg/m?  ? ?Brief Summary:  ?Kevin Frank is a 81 y.o. male smoker with obstructive sleep apnea, COPD with asthma, and history of lung cancer. ?  ? ? ? ?Subjective:  ? ?He was followed by Dr. Melvyn Novas.  He sees Dr. Ernst Bowler. ? ?He has sleep study in 2008.  Has severe sleep apnea.  Has same machine since 2008.  He doesn't remember when he last got new supplies.  Uses [employer].  Uses full face mask.  Has oxygen at night with CPAP but isn't sure how much this is set at. ? ?He smokes 2 ppd.  He doesn't want to quit.  He has cough with clear sputum.  Has intermittent wheezing.  Not having fever, chest pain, or hemoptysis.  Gets around okay at slow pace. ? ?Physical Exam:  ? ?Appearance - well kempt  ? ?ENMT - no sinus tenderness, no oral exudate, no LAN, Mallampati 3 airway, no stridor, poor dentition ? ?Respiratory - decreased breath sounds bilaterally, no wheezing or rales ? ?CV - s1s2 regular rate and rhythm, no murmurs ? ?Ext - no clubbing, no edema ? ?Skin - no rashes ? ?Psych - normal mood and affect ?  ?Pulmonary testing:  ?PFT 04/07/18 >> FEV1 2.25 (75%),  FEV1% 52, TLC 8.47 (120%), RV 4.27 (164%), DLCO 47%, +BD ? ?Chest Imaging:  ?CT chest 08/18/21 >> 2 x 1 cm linear density RUL w/o change, centrilobular emphysema ? ?Sleep Tests:  ?PSG 03/24/07 >> AHI 53, SpO2 low 74% ? ?Cardiac Tests:  ?Echo 11/15/19 >> EF 60 to 65%, grade 1 DD, mild LVH, aortic root 41 mm ? ?Social History:  ?He  reports that he has been smoking cigarettes. He has a 100.00 pack-year smoking history. He has never used smokeless tobacco. He reports current alcohol use. He reports that he does not use drugs. ? ?Family History:  ?His family history includes Asthma in his daughter; Cancer in his mother; Diabetes in his mother; Hypertension in his mother; Liver disease in his father; Lymphoma in his mother; Mental retardation in an other family member. ?  ? ? ?Assessment/Plan:  ? ?Obstructive sleep apnea. ?- he reports compliance with therapy and benefit from CPAP ?- he uses [employer] for his DME ?- his device is ancient and he needs a replacement ?- will arrange for new auto CPAP 5 to 15 cm H2O ? ?COPD with asthma. ?- he is followed by Dr. Salvatore Marvel with asthma/allergy for xolair ?- reviewed his inhaler regimen ?- continue trelegy 100 one puff daily ?- advised him to stop dulera ?- prn albuterol ?- he is to finish script for prednisone from allergist ? ?Nocturnal hypoxemia. ?-  he will call and let us know how much supplemental oxygen he is using at night ? ?Tobacco abuse. ?- he is not interested in quitting at this time ? 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 ?- diagnosed September 2019 ?- s/p SBRT ?- followed by Dr. Delton Coombes with oncology ? ?Time Spent Involved in Patient Care on Day of Examination:  ?52 minutes ? ?Follow up:  ? ?Patient Instructions  ?Call and let us know what your home oxygen system is set at ? ?Will have [employer] arrange for a new auto CPAP machine ? ?Trelegy one puff daily and rinse your mouth after each use ? ?Stop using dulera ? ?Albuterol every 6 hours as needed for  cough, wheeze, chest congestion, or shortness of breath ? ?Follow up in 4 months ? ?Medication List:  ? ?Allergies as of 10/17/2021   ? ?   Reactions  ? No Known Allergies   ? ?  ? ?  ?Medication List  ?  ? ?  ? Accurate as of October 17, 2021 11:51 AM. If you have any questions, ask your nurse or doctor.  ?  ?  ? ?  ? ?STOP taking these medications   ? ?Breztri Aerosphere 160-9-4.8 MCG/ACT Aero ?Generic drug: Budeson-Glycopyrrol-Formoterol ?Stopped by: Chesley Mires, MD ?  ? ?  ? ?TAKE these medications   ? ?albuterol 108 (90 Base) MCG/ACT inhaler ?Commonly known as: VENTOLIN HFA ?Use 4 puffs every 4-6 hours as needed for cough or wheeze ?  ?albuterol (2.5 MG/3ML) 0.083% nebulizer solution ?Commonly known as: PROVENTIL ?Take 3 mLs (2.5 mg total) by nebulization every 6 (six) hours as needed for wheezing or shortness of breath. ?  ?allopurinol 300 MG tablet ?Commonly known as: ZYLOPRIM ?Take 1 tablet (300 mg total) by mouth daily. ?  ?amLODipine 10 MG tablet ?Commonly known as: NORVASC ?Take 1 tablet (10 mg total) by mouth daily. ?  ?atorvastatin 20 MG tablet ?Commonly known as: LIPITOR ?Take 1 tablet (20 mg total) by mouth daily. ?  ?clopidogrel 75 MG tablet ?Commonly known as: PLAVIX ?Take 1 tablet (75 mg total) by mouth daily. ?  ?EPINEPHrine 0.3 mg/0.3 mL Soaj injection ?Commonly known as: EPI-PEN ?Inject 0.3 mg into the muscle as needed for anaphylaxis. ?  ?hydrALAZINE 50 MG tablet ?Commonly known as: APRESOLINE ?Take 1 tablet (50 mg total) by mouth in the morning and at bedtime. ?  ?indomethacin 50 MG capsule ?Commonly known as: INDOCIN ?Take by mouth. ?  ?Jardiance 10 MG Tabs tablet ?Generic drug: empagliflozin ?Take 10 mg by mouth daily. ?  ?losartan 100 MG tablet ?Commonly known as: COZAAR ?TAKE 1 TABLET BY MOUTH ONCE DAILY. ?  ?metFORMIN 500 MG tablet ?Commonly known as: GLUCOPHAGE ?Take 2 tablets (1,000 mg total) by mouth 2 (two) times daily with a meal. ?  ?metoprolol succinate 25 MG 24 hr tablet ?Commonly  known as: TOPROL-XL ?Take 1 tablet (25 mg total) by mouth daily. ?  ?sertraline 50 MG tablet ?Commonly known as: ZOLOFT ?Take 1 tablet (50 mg total) by mouth daily. ?  ?torsemide 20 MG tablet ?Commonly known as: DEMADEX ?Take 1 tablet (20 mg total) by mouth daily. ?  ?Trelegy Ellipta 100-62.5-25 MCG/ACT Aepb ?Generic drug: Fluticasone-Umeclidin-Vilant ?Inhale 1 puff into the lungs daily in the afternoon. ?  ? ?  ? ? ?Signature:  ?Chesley Mires, MD ?[place] ?Pager - [phone] ?10/17/2021, 11:51 AM ?  ? ? ? ? ? ? ? ? ?

## 2021-10-20 DIAGNOSIS — I10 Essential (primary) hypertension: Secondary | ICD-10-CM | POA: Diagnosis not present

## 2021-10-20 DIAGNOSIS — J449 Chronic obstructive pulmonary disease, unspecified: Secondary | ICD-10-CM | POA: Diagnosis not present

## 2021-10-20 DIAGNOSIS — E118 Type 2 diabetes mellitus with unspecified complications: Secondary | ICD-10-CM | POA: Diagnosis not present

## 2021-10-21 ENCOUNTER — Telehealth: Payer: Self-pay | Admitting: Pulmonary Disease

## 2021-10-21 NOTE — Telephone Encounter (Signed)
PCCs can we send this to [place]? I changed the [facility] note and can place a new order if needed. Thank you! ?

## 2021-10-23 NOTE — Telephone Encounter (Signed)
Referral message has been sent to [facility].  ?

## 2021-10-26 ENCOUNTER — Other Ambulatory Visit: Payer: Self-pay | Admitting: *Deleted

## 2021-10-26 MED ORDER — OMALIZUMAB 150 MG/ML ~~LOC~~ SOSY: 375.0000 mg | PREFILLED_SYRINGE | SUBCUTANEOUS | 11 refills | Status: DC

## 2021-10-30 ENCOUNTER — Ambulatory Visit (INDEPENDENT_AMBULATORY_CARE_PROVIDER_SITE_OTHER): Payer: Medicare HMO

## 2021-10-30 DIAGNOSIS — J4540 Moderate persistent asthma, uncomplicated: Secondary | ICD-10-CM | POA: Diagnosis not present

## 2021-11-04 ENCOUNTER — Telehealth: Payer: Self-pay | Admitting: Cardiovascular Disease

## 2021-11-04 ENCOUNTER — Ambulatory Visit: Payer: Medicare HMO | Admitting: Cardiovascular Disease

## 2021-11-04 NOTE — Telephone Encounter (Signed)
Pt added to schedule for today at 2:40 ?

## 2021-11-04 NOTE — Telephone Encounter (Signed)
Patient's wife is calling stating the patient is sleeping all day, nauseous, and not eating good. Patient is scheduled for a 6 month f/u with Dr. Burt Knack today. Is wanting to know if this could be signs of a heart attack due to the daughter seeing online it could be.  ?

## 2021-11-13 ENCOUNTER — Ambulatory Visit (INDEPENDENT_AMBULATORY_CARE_PROVIDER_SITE_OTHER): Payer: Medicare HMO

## 2021-11-13 DIAGNOSIS — J4540 Moderate persistent asthma, uncomplicated: Secondary | ICD-10-CM

## 2021-11-29 ENCOUNTER — Ambulatory Visit: Payer: Medicare HMO

## 2021-11-29 ENCOUNTER — Ambulatory Visit (INDEPENDENT_AMBULATORY_CARE_PROVIDER_SITE_OTHER): Payer: Medicare HMO | Admitting: Allergy & Immunology

## 2021-11-29 ENCOUNTER — Encounter: Payer: Self-pay | Admitting: Allergy & Immunology

## 2021-11-29 VITALS — BP 166/72 | HR 78 | Temp 98.2°F | Resp 20 | Ht 70.75 in | Wt 225.5 lb

## 2021-11-29 DIAGNOSIS — J4540 Moderate persistent asthma, uncomplicated: Secondary | ICD-10-CM

## 2021-11-29 DIAGNOSIS — J441 Chronic obstructive pulmonary disease with (acute) exacerbation: Secondary | ICD-10-CM

## 2021-11-29 DIAGNOSIS — J45901 Unspecified asthma with (acute) exacerbation: Secondary | ICD-10-CM

## 2021-11-29 MED ORDER — TRELEGY ELLIPTA 100-62.5-25 MCG/ACT IN AEPB: 1.0000 | INHALATION_SPRAY | Freq: Every day | RESPIRATORY_TRACT | 5 refills | Status: DC

## 2021-11-29 MED ORDER — ALBUTEROL SULFATE HFA 108 (90 BASE) MCG/ACT IN AERS: INHALATION_SPRAY | RESPIRATORY_TRACT | 2 refills | Status: DC

## 2021-11-29 NOTE — Progress Notes (Signed)
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 Asthma/COPD overlap - Lung testing not done today. - Sample of Trelegy provided.  - We will send in refills.  - Continue with your Trelegy one puff once daily in addition to the oxygen at night  - Continue Xolair injections every 2 weeks. - May use albuterol 2 puffs every 4 hours as needed for cough, wheeze, tightness in chest, shortness of breath OR may use albuterol via nebulizer 1 ampoule every 4 hours as needed for cough, wheeze, tightness in chest, or shortness of breath. - When you come in for your next Xolair injection finger COVID-19 vaccine card so we can figure out if it is time to receive your booster Asthma control goals:  Full participation in all desired activities (may need albuterol before activity) Albuterol use two time or less a week on average (not counting use with activity) Cough interfering with sleep two time or less a month Oral steroids no more than once a year No hospitalizations  2. Seasonal and perennial allergic rhinitis - May use an antihistamine such as Claritin, Allegra, Xyzal, or Zyrtec once a day as needed for runny nose  3. Follow up in 4 months or earlier if needed.    Subjective:   Kevin Frank is a 81 y.o. male presenting today for follow up of  Chief Complaint  Patient presents with   COPD    Coughing a lot with mucus (clear),  Has been using dulera exp. In 2021.    Allergic Rhinitis     Allergies have been doing well.    Medication Refill    Trelegy(mail order)- humana Ventolin inhaler.      Kevin Frank  has a history of the following: Patient Active Problem List   Diagnosis Date Noted   Asthma exacerbation in COPD (Kevin Frank) 08/18/2021   Cough 07/18/2021   Asthma with COPD with exacerbation ([place]) 07/18/2021   Lobar pneumonia ([place]) 12/11/2020   Acute respiratory failure ([place]) 12/10/2020   Wrist pain, acute, right 09/07/2019   Right forearm pain 09/07/2019   Uncontrolled type 2 diabetes mellitus with hyperglycemia ([place]) 06/27/2019   Mixed hyperlipidemia 06/27/2019   Class 1 obesity due to excess calories with serious comorbidity and body mass index (BMI) of 32.0 to 32.9 in adult 06/27/2019   Diabetes mellitus without complication ([place]) 47/42/5956   Positive colorectal cancer screening using Cologuard test 06/14/2019   Squamous cell carcinoma of right lung ([place]) 03/29/2018   Seasonal and perennial allergic rhinitis 09/22/2017   Asthma-COPD overlap syndrome ([place]) 09/22/2017   Obstructive sleep apnea on CPAP 04/23/2017   Chronic knee pain 04/23/2017   AKI (acute kidney injury) ([place]) 04/23/2017   Chronic diastolic CHF (congestive heart failure) ([place]) 04/23/2017   Cellulitis 04/23/2017   Cellulitis of left lower leg    Current smoker 03/05/2016   Incidental lung nodule, > 64mm and < 34mm 03/05/2016   Stage 2 moderate COPD by GOLD classification ([place]) 03/05/2016   AAA (abdominal aortic aneurysm) without rupture ([place]) 06/14/2015   Tobacco use 06/09/2007   OBSTRUCTIVE SLEEP APNEA 06/09/2007   HYPERCHOLESTEROLEMIA 05/06/2007  GOUT 05/06/2007   DEPRESSION 05/06/2007   Essential hypertension, benign 05/06/2007   ABDOMINAL AORTIC ANEURYSM 05/06/2007   BENIGN PROSTATIC HYPERTROPHY, HX OF 05/06/2007    History obtained from: chart review and patient and his daughter .  Kevin Frank is a 81 y.o. male presenting for a follow up visit.  He was last seen in April 2023.  At that time, he was started on a low-dose prednisone burst.  Viral URI.  He is continued on his Trelegy 1 puff once daily and his Xolair  every 2 weeks.  Since last visit, he has done very well.  Asthma/Respiratory Symptom History: He has been on Trelegy, so he started using [facility] which she had leftover from years ago.  It probably has any been up-to-date.  He also was using his Dulera in place of his albuterol, so he needs more albuterol as well.  He has been coughing more over the last week.  However, he has not had a fever and has remained active.  He is up-to-date on his Xolair injections every 2 weeks.  Allergic Rhinitis Symptom History: He remains on his antihistamine as needed.  He has not been on antibiotics at all since last visit.  He is his boisterous is ever.  He is planning to have his barbecue on July 15 and has not been assaultive event.  His wife [name] was just diagnosed with ulcers.  Her PPI was increased to twice a day.  Otherwise, there have been no changes to his past medical history, surgical history, family history, or social history.    Review of Systems  Constitutional: Negative.  Negative for chills, fever, malaise/fatigue and weight loss.  HENT: Negative.  Negative for congestion, ear discharge, ear pain and sinus pain.   Eyes:  Negative for pain, discharge and redness.  Respiratory:  Positive for cough and sputum production. Negative for shortness of breath and wheezing.   Cardiovascular: Negative.  Negative for chest pain and palpitations.  Gastrointestinal:  Negative for abdominal pain, constipation, diarrhea, heartburn, nausea and vomiting.  Skin: Negative.  Negative for itching and rash.  Neurological:  Negative for dizziness and headaches.  Endo/Heme/Allergies:  Negative for environmental allergies. Does not bruise/bleed easily.       Objective:   Blood pressure (!) 166/72, pulse 78, temperature 98.2 F (36.8 C), resp. rate 20, height 5' 10.75" (1.797 m), weight 225 lb 8 oz (102.3 kg), SpO2 93 %. Body mass index is 31.67 kg/m.    Physical Exam Vitals reviewed.  Constitutional:       Appearance: He is well-developed.     Comments: Back to his boisterous self.  Very sassy.  HENT:     Head: Normocephalic and atraumatic.     Right Ear: Tympanic membrane, ear canal and external ear normal.     Left Ear: Tympanic membrane, ear canal and external ear normal.     Nose: No nasal deformity, septal deviation, mucosal edema or rhinorrhea.     Right Turbinates: Enlarged, swollen and pale.     Left Turbinates: Enlarged, swollen and pale.     Right Sinus: No maxillary sinus tenderness or frontal sinus tenderness.     Left Sinus: No maxillary sinus tenderness or frontal sinus tenderness.     Comments: No nasal polyps.    Mouth/Throat:     Mouth: Mucous membranes are not pale and dry.     Pharynx: Uvula midline.  Eyes:     General: Lids are normal. No allergic shiner.  Right eye: No discharge.        Left eye: No discharge.     Conjunctiva/sclera: Conjunctivae normal.     Right eye: Right conjunctiva is not injected. No chemosis.    Left eye: Left conjunctiva is not injected. No chemosis.    Pupils: Pupils are equal, round, and reactive to light.  Cardiovascular:     Rate and Rhythm: Normal rate and regular rhythm.     Heart sounds: Normal heart sounds.  Pulmonary:     Effort: Pulmonary effort is normal. No tachypnea, accessory muscle usage or respiratory distress.     Breath sounds: No wheezing, rhonchi or rales.     Comments: He does have rhonchi throughout, which is his baseline.  No overt wheezing.  Moving air well in all lung fields. Chest:     Chest wall: No tenderness.  Lymphadenopathy:     Cervical: No cervical adenopathy.  Skin:    General: Skin is warm.     Capillary Refill: Capillary refill takes less than 2 seconds.     Coloration: Skin is not pale.     Findings: No abrasion, erythema, petechiae or rash. Rash is not papular, urticarial or vesicular.  Neurological:     Mental Status: He is alert.  Psychiatric:        Behavior: Behavior is cooperative.       Diagnostic studies: none (Xolair injections given today)        Salvatore Marvel, MD  [facility] [facility]

## 2021-11-29 NOTE — Patient Instructions (Signed)
1. Asthma/COPD overlap - Lung testing not done today. - Sample of Trelegy provided.  - We will send in refills.  - Continue with your Trelegy one puff once daily in addition to the oxygen at night   - Continue Xolair injections every 2 weeks. - May use albuterol 2 puffs every 4 hours as needed for cough, wheeze, tightness in chest, shortness of breath OR may use albuterol via nebulizer 1 ampoule every 4 hours as needed for cough, wheeze, tightness in chest, or shortness of breath. - When you come in for your next Xolair injection finger COVID-19 vaccine card so we can figure out if it is time to receive your booster Asthma control goals:  Full participation in all desired activities (may need albuterol before activity) Albuterol use two time or less a week on average (not counting use with activity) Cough interfering with sleep two time or less a month Oral steroids no more than once a year No hospitalizations  2. Seasonal and perennial allergic rhinitis - May use an antihistamine such as Claritin, Allegra, Xyzal, or Zyrtec once a day as needed for runny nose  3. Follow up in 4 months or earlier if needed.    Please inform us of any Emergency Department visits, hospitalizations, or changes in symptoms. Call us before going to the ED for breathing or allergy symptoms since we might be able to fit you in for a sick visit. Feel free to contact us anytime with any questions, problems, or concerns.  It was a pleasure to see you and your family again today! SEE YOU IN [place]!!  Websites that have reliable patient information: 1. American Academy of Asthma, Allergy, and Immunology: www.aaaai.org 2. Food Allergy Research and Education (FARE): foodallergy.org 3. Mothers of Asthmatics: http://www.asthmacommunitynetwork.org 4. American College of Allergy, Asthma, and Immunology: www.acaai.org   COVID-19 Vaccine Information can be found at:  [email or web address].[email or web address].uk For questions related to vaccine distribution or appointments, please email [email or web address] or call 2200178067.   We realize that you might be concerned about having an allergic reaction to the COVID19 vaccines. To help with that concern, WE ARE OFFERING THE COVID19 VACCINES IN OUR OFFICE! Ask the front desk for dates!     "Like" [place] on Facebook and Instagram for our latest updates!      A healthy democracy works best when [employer] participate! Make sure you are registered to vote! If you have moved or changed any of your contact information, you will need to get this updated before voting!  In some cases, you MAY be able to register to vote online: [email or web address]

## 2021-12-09 ENCOUNTER — Ambulatory Visit: Payer: Medicare HMO | Admitting: Physician Assistant

## 2021-12-09 ENCOUNTER — Encounter: Payer: Self-pay | Admitting: Physician Assistant

## 2021-12-09 VITALS — BP 140/82 | HR 73 | Ht 70.0 in | Wt 224.4 lb

## 2021-12-09 DIAGNOSIS — E7800 Pure hypercholesterolemia, unspecified: Secondary | ICD-10-CM | POA: Diagnosis not present

## 2021-12-09 DIAGNOSIS — J449 Chronic obstructive pulmonary disease, unspecified: Secondary | ICD-10-CM | POA: Diagnosis not present

## 2021-12-09 DIAGNOSIS — F17200 Nicotine dependence, unspecified, uncomplicated: Secondary | ICD-10-CM

## 2021-12-09 DIAGNOSIS — I2510 Atherosclerotic heart disease of native coronary artery without angina pectoris: Secondary | ICD-10-CM | POA: Insufficient documentation

## 2021-12-09 DIAGNOSIS — I2584 Coronary atherosclerosis due to calcified coronary lesion: Secondary | ICD-10-CM

## 2021-12-09 DIAGNOSIS — I10 Essential (primary) hypertension: Secondary | ICD-10-CM

## 2021-12-09 DIAGNOSIS — I7143 Infrarenal abdominal aortic aneurysm, without rupture: Secondary | ICD-10-CM

## 2021-12-09 DIAGNOSIS — I5032 Chronic diastolic (congestive) heart failure: Secondary | ICD-10-CM | POA: Diagnosis not present

## 2021-12-09 MED ORDER — HYDRALAZINE HCL 50 MG PO TABS: 50.0000 mg | ORAL_TABLET | Freq: Three times a day (TID) | ORAL | 3 refills | Status: DC

## 2021-12-09 NOTE — Progress Notes (Signed)
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 Trula Slade) Required L renal artery angioplasty to restore flow post op in setting of AKI Osteoarthritis  Prior CV Studies: Echocardiogram 11/15/19 Mild LVH, Gr 1 DD, EF 60-65, no RWMA, normal RVSF, trivial MR, AV sclerosis, no AS, mild dilation of ascending aorta (41 mm)   GATED SPECT MYO PERF W/LEXISCAN STRESS 1D 10/26/2019 Narrative  Nuclear stress EF: 60%. The left ventricular ejection fraction is normal (55-65%).  This is a low risk study. There is no evidence of ischemia and no evidence of previous myocardial infarction.  The study is normal.   History of Present Illness:   Kevin Frank is a 81 y.o. male with the above problem list.  He was last seen in Oct 2022 by Kevin Barrios, PA-C.  He returns for f/u.  He is here with his wife.  She notes that he is fatigued most of the time and falls asleep easily.  He is getting a new CPAP machine soon.  He has not had syncope.  He has chronic shortness of breath related to COPD.  This is unchanged.  He has not had chest pain, orthopnea, leg edema.  He continues to smoke 2 packs of cigarettes a day.        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   albuterol (VENTOLIN HFA) 108 (90 Base) MCG/ACT inhaler Use 4 puffs every 4-6 hours as needed for cough or wheeze   allopurinol (ZYLOPRIM) 300 MG tablet Take 1 tablet (300 mg total) by mouth daily.   amLODipine (NORVASC) 10 MG tablet Take 1 tablet (10 mg total) by mouth daily.   atorvastatin (LIPITOR) 20 MG tablet Take 1 tablet (20 mg  total) by mouth daily.   clopidogrel (PLAVIX) 75 MG tablet Take 1 tablet (75 mg total) by mouth daily.   EPINEPHrine 0.3 mg/0.3 mL IJ SOAJ injection Inject 0.3 mg into the muscle as needed for anaphylaxis.   Fluticasone-Umeclidin-Vilant (TRELEGY ELLIPTA) 100-62.5-25 MCG/ACT AEPB Inhale 1 puff into the lungs daily.   hydrALAZINE (APRESOLINE) 50 MG tablet Take 1 tablet (50 mg total) by mouth in the morning and at bedtime.   JARDIANCE 10 MG TABS tablet Take 10 mg by mouth daily.   losartan (COZAAR) 100 MG tablet TAKE 1 TABLET BY MOUTH ONCE DAILY.   metFORMIN (GLUCOPHAGE) 500 MG tablet Take 2 tablets (1,000 mg total) by mouth 2 (two) times daily with a meal.   metoprolol succinate (TOPROL-XL) 25 MG 24 hr tablet Take 1 tablet (25 mg total) by  mouth daily.   omalizumab Arvid Right) 150 MG/ML prefilled syringe Inject 375 mg into the skin every 14 (fourteen) days. Dispense 4 150mg  syringes and 2 75mg  syringes for 28 days supply   sertraline (ZOLOFT) 50 MG tablet Take 1 tablet (50 mg total) by mouth daily.   torsemide (DEMADEX) 20 MG tablet Take 1 tablet (20 mg total) by mouth daily.   Current Facility-Administered Medications for the 12/09/21 encounter (Office Visit) with Richardson Dopp T, PA-C  Medication   omalizumab Arvid Right) prefilled syringe 375 mg    Allergies:   No known allergies   Social History   Tobacco Use   Smoking status: Every Day    Packs/day: 2.00    Years: 50.00    Total pack years: 100.00    Types: Cigarettes   Smokeless tobacco: Never   Tobacco comments:    Smokes 2 packs a day [facility] 10/17/2021   Vaping Use   Vaping Use: Never used  Substance Use Topics   Alcohol use: Yes    Alcohol/week: 0.0 standard drinks of alcohol    Comment: very little   Drug use: No    Family Hx: The patient's family history includes Asthma in his daughter; Cancer in his mother; Diabetes in his mother; Hypertension in his mother; Liver disease in his father; Lymphoma in his mother; Mental retardation in an other family member. There is no history of Allergic rhinitis, Angioedema, Atopy, Eczema, Immunodeficiency, or Colon cancer.  Review of Systems  Constitutional: Positive for malaise/fatigue.  Psychiatric/Behavioral:  Positive for memory loss.      EKGs/Labs/Other Test Reviewed:    EKG:  EKG is  ordered today.  The ekg ordered today demonstrates NSR, HR 73, left axis deviation, incomplete right bundle branch block, no ST-T wave changes, QTc 436, similar to prior tracing  Recent Labs: 08/18/2021: ALT 27; B Natriuretic Peptide 56.0; BUN 12; Creatinine, Ser 0.90; Hemoglobin 17.7; Platelets 163; Potassium 3.9; Sodium 136   Recent Lipid Panel No results for input(s): "CHOL", "TRIG", "HDL", "VLDL", "LDLCALC", "LDLDIRECT" in the last  8760 hours.   Risk Assessment/Calculations/Metrics:              Physical Exam:    VS:  BP 140/82   Pulse 73   Ht 5\' 10"  (1.778 m)   Wt 224 lb 6.4 oz (101.8 kg)   SpO2 93%   BMI 32.20 kg/m     Wt Readings from Last 3 Encounters:  12/09/21 224 lb 6.4 oz (101.8 kg)  11/29/21 225 lb 8 oz (102.3 kg)  10/17/21 229 lb 12.8 oz (104.2 kg)    Constitutional:      Appearance: Healthy appearance. Not in distress.  Neck:     Vascular: No carotid bruit. JVD normal.  Pulmonary:     Effort: Pulmonary effort is normal.     Breath sounds: No wheezing. No rales.  Cardiovascular:     Normal rate. Regular rhythm. Normal S1. Normal S2.      Murmurs: There is no murmur.  Edema:    Peripheral edema absent.  Abdominal:     Palpations: Abdomen is soft.  Skin:    General: Skin is warm and dry.  Neurological:     General: No focal deficit present.     Mental Status: Alert and oriented to person, place and time.         ASSESSMENT & PLAN:   Coronary artery calcification Calcification noted on prior CT scan.  Myoview in 2021 was low risk.  He is not having anginal symptoms.  EKG does not demonstrate any new changes.  Continue clopidogrel 75 mg daily, atorvastatin 20 mg daily.  Follow-up in 1 year.  Chronic heart failure with preserved ejection fraction (HCC) EF 60-65 on echocardiogram in 2021.  NYHA IIb.  Volume status stable.  Creatinine in February was normal.  Continue torsemide 20 mg daily.  AAA (abdominal aortic aneurysm) without rupture (HCC) Status post endovascular repair.  He is followed by Dr. Trula Slade with vascular surgery.  Essential hypertension Blood pressure somewhat above target.  I have asked him to increase his hydralazine to 50 mg 3 times a day (instead of twice a day).  Continue amlodipine 10 mg daily, losartan 100 mg daily, Toprol-XL 25 mg daily.  HYPERCHOLESTEROLEMIA LDL in January 2023 was optimal.  Continue atorvastatin 20 mg daily.  Stage 2 moderate COPD by GOLD  classification [facility]) Follow-up with pulmonology as planned.  Current smoker He is not willing to quit.            Dispo:  Return in about 1 year (around 12/10/2022) for Routine Follow Up, w/ Richardson Dopp, PA-C.   Medication Adjustments/Labs and Tests Ordered: Current medicines are reviewed at length with the patient today.  Concerns regarding medicines are outlined above.  Tests Ordered: Orders Placed This Encounter  Procedures   EKG 12-Lead   ECHOCARDIOGRAM COMPLETE   Medication Changes: No orders of the defined types were placed in this encounter.  Signed, Richardson Dopp, PA-C  12/09/2021 [facility] [place], [place], [place]  [address] Phone: [phone]; Fax: [phone]

## 2021-12-09 NOTE — Assessment & Plan Note (Signed)
He is not willing to quit.

## 2021-12-09 NOTE — Assessment & Plan Note (Signed)
Calcification noted on prior CT scan.  Myoview in 2021 was low risk.  He is not having anginal symptoms.  EKG does not demonstrate any new changes.  Continue clopidogrel 75 mg daily, atorvastatin 20 mg daily.  Follow-up in 1 year.

## 2021-12-09 NOTE — Assessment & Plan Note (Signed)
Follow-up with pulmonology as planned.

## 2021-12-09 NOTE — Assessment & Plan Note (Signed)
Status post endovascular repair.  He is followed by Dr. Trula Slade with vascular surgery.

## 2021-12-09 NOTE — Assessment & Plan Note (Signed)
Blood pressure somewhat above target.  I have asked him to increase his hydralazine to 50 mg 3 times a day (instead of twice a day).  Continue amlodipine 10 mg daily, losartan 100 mg daily, Toprol-XL 25 mg daily.

## 2021-12-09 NOTE — Patient Instructions (Addendum)
Medication Instructions:  Increase hydralazine 50 mg to three times a day *If you need a refill on your cardiac medications before your next appointment, please call your pharmacy*   Lab Work: None If you have labs (blood work) drawn today and your tests are completely normal, you will receive your results only by: [place] (if you have MyChart) OR A paper copy in the mail If you have any lab test that is abnormal or we need to change your treatment, we will call you to review the results.   Testing/Procedures: Your physician has requested that you have an echocardiogram in [place] Echocardiography is a painless test that uses sound waves to create images of your heart. It provides your doctor with information about the size and shape of your heart and how well your heart's chambers and valves are working. This procedure takes approximately one hour. There are no restrictions for this procedure.    Follow-Up: At [facility], you and your health needs are our priority.  As part of our continuing mission to provide you with exceptional heart care, we have created designated Provider Care Teams.  These Care Teams include your primary Cardiologist (physician) and Advanced Practice Providers (APPs -  Physician Assistants and Nurse Practitioners) who all work together to provide you with the care you need, when you need it.  We recommend signing up for the patient portal called "MyChart".  Sign up information is provided on this After Visit Summary.  MyChart is used to connect with patients for Virtual Visits (Telemedicine).  Patients are able to view lab/test results, encounter notes, upcoming appointments, etc.  Non-urgent messages can be sent to your provider as well.   To learn more about what you can do with MyChart, go to [email or web address].    Your next appointment:   1 year(s)  The format for your next appointment:   In Person  Provider:   Richardson Dopp, PA-C      {   Important Information About Sugar

## 2021-12-09 NOTE — Assessment & Plan Note (Signed)
EF 60-65 on echocardiogram in 2021.  NYHA IIb.  Volume status stable.  Creatinine in February was normal.  Continue torsemide 20 mg daily.

## 2021-12-09 NOTE — Assessment & Plan Note (Signed)
LDL in January 2023 was optimal.  Continue atorvastatin 20 mg daily.

## 2021-12-13 ENCOUNTER — Ambulatory Visit: Payer: Medicare HMO

## 2021-12-17 ENCOUNTER — Inpatient Hospital Stay (HOSPITAL_COMMUNITY): Payer: Medicare HMO | Attending: Hematology

## 2021-12-17 ENCOUNTER — Ambulatory Visit (HOSPITAL_COMMUNITY)
Admission: RE | Admit: 2021-12-17 | Discharge: 2021-12-17 | Disposition: A | Discharge status: Home / Self Care | Payer: Medicare HMO | Source: Ambulatory Visit | Attending: Hematology | Admitting: Hematology

## 2021-12-17 ENCOUNTER — Encounter (HOSPITAL_COMMUNITY): Payer: Self-pay | Admitting: Radiology

## 2021-12-17 DIAGNOSIS — C3411 Malignant neoplasm of upper lobe, right bronchus or lung: Secondary | ICD-10-CM | POA: Insufficient documentation

## 2021-12-17 DIAGNOSIS — I700 Atherosclerosis of aorta: Secondary | ICD-10-CM | POA: Insufficient documentation

## 2021-12-17 DIAGNOSIS — Z79899 Other long term (current) drug therapy: Secondary | ICD-10-CM | POA: Insufficient documentation

## 2021-12-17 DIAGNOSIS — I2510 Atherosclerotic heart disease of native coronary artery without angina pectoris: Secondary | ICD-10-CM | POA: Diagnosis not present

## 2021-12-17 DIAGNOSIS — J432 Centrilobular emphysema: Secondary | ICD-10-CM | POA: Diagnosis not present

## 2021-12-17 DIAGNOSIS — C3491 Malignant neoplasm of unspecified part of right bronchus or lung: Secondary | ICD-10-CM

## 2021-12-17 DIAGNOSIS — C3490 Malignant neoplasm of unspecified part of unspecified bronchus or lung: Secondary | ICD-10-CM

## 2021-12-17 DIAGNOSIS — J439 Emphysema, unspecified: Secondary | ICD-10-CM | POA: Diagnosis not present

## 2021-12-17 LAB — COMPREHENSIVE METABOLIC PANEL
ALT: 19 U/L (ref 0–44)
AST: 17 U/L (ref 15–41)
Albumin: 3.8 g/dL (ref 3.5–5.0)
Alkaline Phosphatase: 95 U/L (ref 38–126)
Anion gap: 10 (ref 5–15)
BUN: 17 mg/dL (ref 8–23)
CO2: 23 mmol/L (ref 22–32)
Calcium: 8.8 mg/dL — ABNORMAL LOW (ref 8.9–10.3)
Chloride: 104 mmol/L (ref 98–111)
Creatinine, Ser: 1 mg/dL (ref 0.61–1.24)
GFR, Estimated: >60 mL/min (ref >60)
Glucose, Bld: 169 mg/dL — ABNORMAL HIGH (ref 70–99)
Potassium: 3.7 mmol/L (ref 3.5–5.1)
Sodium: 137 mmol/L (ref 135–145)
Total Bilirubin: 0.6 mg/dL (ref 0.3–1.2)
Total Protein: 6.9 g/dL (ref 6.5–8.1)

## 2021-12-17 LAB — CBC WITH DIFFERENTIAL/PLATELET
Abs Immature Granulocytes: 0.03 10*3/uL (ref 0.00–0.07)
Basophils Absolute: 0.1 10*3/uL (ref 0.0–0.1)
Basophils Relative: 1 %
Eosinophils Absolute: 0.3 10*3/uL (ref 0.0–0.5)
Eosinophils Relative: 2 %
HCT: 48.6 % (ref 39.0–52.0)
Hemoglobin: 16.6 g/dL (ref 13.0–17.0)
Immature Granulocytes: 0 %
Lymphocytes Relative: 20 %
Lymphs Abs: 2 10*3/uL (ref 0.7–4.0)
MCH: 31.9 pg (ref 26.0–34.0)
MCHC: 34.2 g/dL (ref 30.0–36.0)
MCV: 93.5 fL (ref 80.0–100.0)
Monocytes Absolute: 0.9 10*3/uL (ref 0.1–1.0)
Monocytes Relative: 9 %
Neutro Abs: 7.2 10*3/uL (ref 1.7–7.7)
Neutrophils Relative %: 68 %
Platelets: 171 10*3/uL (ref 150–400)
RBC: 5.2 MIL/uL (ref 4.22–5.81)
RDW: 14.3 % (ref 11.5–15.5)
WBC: 10.5 10*3/uL (ref 4.0–10.5)
nRBC: 0 % (ref 0.0–0.2)

## 2021-12-17 MED ORDER — IOHEXOL 300 MG/ML  SOLN
100.0000 mL | Freq: Once | INTRAMUSCULAR | Status: AC | PRN
  Administered 2021-12-17: 75 mL via INTRAVENOUS

## 2021-12-18 ENCOUNTER — Ambulatory Visit (INDEPENDENT_AMBULATORY_CARE_PROVIDER_SITE_OTHER): Payer: Medicare HMO

## 2021-12-18 DIAGNOSIS — J4540 Moderate persistent asthma, uncomplicated: Secondary | ICD-10-CM | POA: Diagnosis not present

## 2021-12-19 ENCOUNTER — Inpatient Hospital Stay (HOSPITAL_COMMUNITY): Admission: RE | Admit: 2021-12-19 | Payer: Medicare HMO | Source: Ambulatory Visit

## 2021-12-20 ENCOUNTER — Telehealth: Payer: Self-pay

## 2021-12-20 MED ORDER — TRELEGY ELLIPTA 100-62.5-25 MCG/ACT IN AEPB: 1.0000 | INHALATION_SPRAY | Freq: Every day | RESPIRATORY_TRACT | 5 refills | Status: DC

## 2021-12-20 NOTE — Telephone Encounter (Signed)
Patient's wife called requesting a refill on Trelegy 200.  [place]

## 2021-12-20 NOTE — Telephone Encounter (Signed)
Trelegy 200  has been sent in their preferred pharmacy. The mailbox was full so I could not leave a message.

## 2021-12-25 ENCOUNTER — Inpatient Hospital Stay (HOSPITAL_COMMUNITY): Payer: Medicare HMO | Attending: Hematology | Admitting: Hematology

## 2021-12-25 VITALS — BP 149/74 | HR 57 | Temp 98.3°F | Resp 18 | Ht 70.0 in | Wt 227.1 lb

## 2021-12-25 DIAGNOSIS — C3491 Malignant neoplasm of unspecified part of right bronchus or lung: Secondary | ICD-10-CM | POA: Diagnosis not present

## 2021-12-25 DIAGNOSIS — C3490 Malignant neoplasm of unspecified part of unspecified bronchus or lung: Secondary | ICD-10-CM

## 2021-12-25 DIAGNOSIS — R059 Cough, unspecified: Secondary | ICD-10-CM | POA: Insufficient documentation

## 2021-12-25 DIAGNOSIS — R5383 Other fatigue: Secondary | ICD-10-CM | POA: Insufficient documentation

## 2021-12-25 DIAGNOSIS — C3411 Malignant neoplasm of upper lobe, right bronchus or lung: Secondary | ICD-10-CM | POA: Insufficient documentation

## 2021-12-25 DIAGNOSIS — F17210 Nicotine dependence, cigarettes, uncomplicated: Secondary | ICD-10-CM | POA: Insufficient documentation

## 2021-12-25 DIAGNOSIS — J449 Chronic obstructive pulmonary disease, unspecified: Secondary | ICD-10-CM | POA: Diagnosis not present

## 2021-12-25 NOTE — Patient Instructions (Addendum)
[place] at [facility] Discharge Instructions  You were seen and examined today by Dr. Delton Coombes.  Dr. Delton Coombes discussed your most recent lab work and CT scan which revealed that everything looks good.  Follow-up as scheduled in 6 months.    Thank you for choosing [place] at [facility] to provide your oncology and hematology care.  To afford each patient quality time with our provider, please arrive at least 15 minutes before your scheduled appointment time.   If you have a lab appointment with the [facility] please come in thru the Main Entrance and check in at the main information desk.  You need to re-schedule your appointment should you arrive 10 or more minutes late.  We strive to give you quality time with our providers, and arriving late affects you and other patients whose appointments are after yours.  Also, if you no show three or more times for appointments you may be dismissed from the clinic at the providers discretion.     Again, thank you for choosing [facility].  Our hope is that these requests will decrease the amount of time that you wait before being seen by our physicians.       _____________________________________________________________  Should you have questions after your visit to [facility], please contact our office at [phone] and follow the prompts.  Our office hours are 8:00 a.m. and 4:30 p.m. Monday - Friday.  Please note that voicemails left after 4:00 p.m. may not be returned until the following business day.  We are closed weekends and major holidays.  You do have access to a nurse 24-7, just call the main number to the clinic [phone] and do not press any options, hold on the line and a nurse will answer the phone.    For prescription refill requests, have your pharmacy contact our office and allow 72 hours.    Due to Covid, you will need to wear a mask upon  entering the hospital. If you do not have a mask, a mask will be given to you at the Main Entrance upon arrival. For doctor visits, patients may have 1 support person age 71 or older with them. For treatment visits, patients can not have anyone with them due to social distancing guidelines and our immunocompromised population.

## 2021-12-25 NOTE — Progress Notes (Signed)
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  PRIOR THERAPY: SBRT 54 Gy in 3 fractions to right lung from 05/05/2018 to 05/12/2018  NGS Results: not done  CURRENT THERAPY: surveillance  BRIEF ONCOLOGIC HISTORY:  Oncology History  Squamous cell carcinoma of right lung ([place] [facility])  03/29/2018 Initial Diagnosis   Squamous cell carcinoma of right lung ([facility])   03/29/2018 Cancer Staging   Staging form: Lung, AJCC 8th Edition - Clinical stage from 03/29/2018: Stage IA3 (cT1c, cN0, cM0) - Signed by Derek Jack, MD on 03/29/2018     CANCER STAGING: Cancer Staging  Squamous cell carcinoma of right lung ([facility]) Staging form: Lung, AJCC 8th Edition - Clinical stage from 03/29/2018: Stage IA3 (cT1c, cN0, cM0) - Signed by Derek Jack, MD on 03/29/2018   INTERVAL HISTORY:  Kevin Frank, a 81 y.o. male, returns for routine follow-up of his squamous cell carcinoma of the right lung. Kevin Frank was last seen on 06/18/2021.   Today he reports feeling good, and he is accompanied by his wife. He denies hemoptysis. He reports a cough productive of clear sputum. His weight is stable. He currently smokes 2-3 ppd. He expresses no interest in quitting, and he states he is "too old to quit". He takes metformin, 2 tablets in the morning and 1 tablet at night. His wife reports he is sleeping mostly during the day rather than the night.   REVIEW OF SYSTEMS:  Review of Systems  Constitutional:  Positive for fatigue. Negative for appetite change.  Respiratory:  Positive for cough and shortness of breath. Negative for hemoptysis.   All other systems reviewed and are negative.   PAST MEDICAL/SURGICAL HISTORY:  Past Medical History:  Diagnosis Date   AAA (abdominal aortic  aneurysm) ([facility]) 01/2008   3.2cm   Allergy    Arthritis    Asthma    BPH (benign prostatic hyperplasia)    Chronic knee pain    bilateral   COPD (chronic obstructive pulmonary disease) ([facility])    Diabetes mellitus    type II   Gout    History of kidney stones    Hypercholesterolemia    Hyperlipidemia    Hypertension    Lung cancer ([facility]) 2019   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    Normal nuclear stress test 2011   Obesity    Obstructive sleep apnea on CPAP    Presence of indwelling urinary catheter    Shortness of breath    due to cigarette abuse   Tobacco abuse    Past Surgical History:  Procedure Laterality Date   ABDOMINAL AORTIC ENDOVASCULAR FENESTRATED STENT GRAFT N/A 04/09/2016   Procedure: ABDOMINAL AORTIC ENDOVASCULAR FENESTRATED STENT GRAFT with left brachial artery access using ultrasound guidance;  Surgeon: Serafina Mitchell, MD;  Location: [facility];  Service: Vascular;  Laterality: N/A;   [place]   [facility]   COLONOSCOPY  2006   2006: multiple diminutive polyps in rectum, one pedunculated polyp at 10 cm s/p snare removal. Left-sided diverticulum, pedunculated polyps at 35 cm and splenic flexure s/p snare removal. No path available.    COLONOSCOPY WITH PROPOFOL N/A 11/24/2019   Procedure: COLONOSCOPY WITH PROPOFOL;  Surgeon: Daneil Dolin, MD;  [facility];  Service: Endoscopy;  Laterality: N/A;  9:45am   PERIPHERAL VASCULAR CATHETERIZATION N/A 04/10/2016   Procedure: Renal Angiography;  Surgeon: Angelia Mould, MD;  Location: [facility];  Service: Cardiovascular;  Laterality: N/A;   POLYPECTOMY  11/24/2019   Procedure: POLYPECTOMY;  Surgeon: Daneil Dolin, MD;  [facility]: [facility];  Service: Endoscopy;;   TRANSURETHRAL RESECTION OF PROSTATE  01/07/2011   Procedure: TRANSURETHRAL RESECTION OF THE PROSTATE (TURP);  Surgeon: Marissa Nestle;  Location: [facility];  Service: Urology;  Laterality: N/A;    SOCIAL  HISTORY:  Social History   Socioeconomic History   Marital status: Married    Spouse name: [name]   Number of children: 2   Years of education: Not on file   Highest education level: Not on file  Occupational History   Not on file  Tobacco Use   Smoking status: Every Day    Packs/day: 2.00    Years: 50.00    Total pack years: 100.00    Types: Cigarettes   Smokeless tobacco: Never   Tobacco comments:    Smokes 2 packs a day [facility] 10/17/2021   Vaping Use   Vaping Use: Never used  Substance and Sexual Activity   Alcohol use: Yes    Alcohol/week: 0.0 standard drinks of alcohol    Comment: very little   Drug use: No   Sexual activity: Not on file  Other Topics Concern   Not on file  Social History Narrative   Lives at home with wife   Social Determinants of Health   Financial Resource Strain: Not on file  Food Insecurity: Not on file  Transportation Needs: No Transportation Needs (04/27/2018)   PRAPARE - Hydrologist (Medical): No    Lack of Transportation (Non-Medical): No  Physical Activity: Not on file  Stress: Not on file  Social Connections: Not on file  Intimate Partner Violence: Not on file    FAMILY HISTORY:  Family History  Problem Relation Age of Onset   Liver disease Father    Lymphoma Mother    Diabetes Mother    Hypertension Mother    Cancer Mother    Mental retardation Other        sibling -- from birth    Asthma Daughter    Allergic rhinitis Neg Hx    Angioedema Neg Hx    Atopy Neg Hx    Eczema Neg Hx    Immunodeficiency Neg Hx    Colon cancer Neg Hx     CURRENT MEDICATIONS:  Current Outpatient Medications  Medication Sig Dispense Refill   albuterol (PROVENTIL) (2.5 MG/3ML) 0.083% nebulizer solution Take 3 mLs (2.5 mg total) by nebulization every 6 (six) hours as needed for wheezing or shortness of breath. 150 mL 2   albuterol (VENTOLIN HFA) 108 (90 Base) MCG/ACT inhaler Use 4 puffs every 4-6 hours as needed for  cough or wheeze 18 g 2   allopurinol (ZYLOPRIM) 300 MG tablet Take 1 tablet (300 mg total) by mouth daily. 30 tablet 1   amLODipine (NORVASC) 10 MG tablet Take 1 tablet (10 mg total) by mouth daily. 14 tablet 0   atorvastatin (LIPITOR) 20 MG tablet Take 1 tablet (20 mg total) by mouth daily. 90 tablet 1   clopidogrel (PLAVIX) 75 MG tablet Take 1 tablet (75 mg total) by mouth daily. 30 tablet 1   EPINEPHrine 0.3 mg/0.3 mL IJ SOAJ injection Inject 0.3 mg into the muscle as needed  for anaphylaxis. 1 each 1   Fluticasone-Umeclidin-Vilant (TRELEGY ELLIPTA) 100-62.5-25 MCG/ACT AEPB Inhale 1 puff into the lungs daily. 60 each 5   hydrALAZINE (APRESOLINE) 50 MG tablet Take 1 tablet (50 mg total) by mouth 3 (three) times daily. 270 tablet 3   JARDIANCE 10 MG TABS tablet Take 10 mg by mouth daily.     losartan (COZAAR) 100 MG tablet TAKE 1 TABLET BY MOUTH ONCE DAILY. 30 tablet 0   metFORMIN (GLUCOPHAGE) 500 MG tablet Take 2 tablets (1,000 mg total) by mouth 2 (two) times daily with a meal. 120 tablet 0   metoprolol succinate (TOPROL-XL) 25 MG 24 hr tablet Take 1 tablet (25 mg total) by mouth daily. 14 tablet 0   omalizumab (XOLAIR) 150 MG/ML prefilled syringe Inject 375 mg into the skin every 14 (fourteen) days. Dispense 4 150mg  syringes and 2 75mg  syringes for 28 days supply 5 mL 11   sertraline (ZOLOFT) 50 MG tablet Take 1 tablet (50 mg total) by mouth daily. 30 tablet 1   torsemide (DEMADEX) 20 MG tablet Take 1 tablet (20 mg total) by mouth daily.     Current Facility-Administered Medications  Medication Dose Route Frequency Provider Last Rate Last Admin   omalizumab Arvid Right) prefilled syringe 375 mg  375 mg Subcutaneous Q14 Days Valentina Shaggy, MD   375 mg at 12/18/21 1412    ALLERGIES:  Allergies  Allergen Reactions   No Known Allergies     PHYSICAL EXAM:  Performance status (ECOG): 1 - Symptomatic but completely ambulatory  There were no vitals filed for this visit. Wt Readings from  Last 3 Encounters:  12/09/21 224 lb 6.4 oz (101.8 kg)  11/29/21 225 lb 8 oz (102.3 kg)  10/17/21 229 lb 12.8 oz (104.2 kg)   Physical Exam Vitals reviewed.  Constitutional:      Appearance: Normal appearance. He is obese.  Cardiovascular:     Rate and Rhythm: Normal rate and regular rhythm.     Pulses: Normal pulses.     Heart sounds: Normal heart sounds.  Pulmonary:     Effort: Pulmonary effort is normal.     Breath sounds: Normal breath sounds.  Neurological:     General: No focal deficit present.     Mental Status: He is alert and oriented to person, place, and time.  Psychiatric:        Mood and Affect: Mood normal.        Behavior: Behavior normal.      LABORATORY DATA:  I have reviewed the labs as listed.     Latest Ref Rng & Units 12/17/2021    1:13 PM 08/18/2021    1:29 PM 05/06/2021    9:49 AM  CBC  WBC 4.0 - 10.5 K/uL 10.5  6.9  13.9   Hemoglobin 13.0 - 17.0 g/dL 16.6  17.7  15.8   Hematocrit 39.0 - 52.0 % 48.6  51.7  46.3   Platelets 150 - 400 K/uL 171  163  171       Latest Ref Rng & Units 12/17/2021    1:13 PM 08/18/2021    1:29 PM 06/12/2021    1:00 PM  CMP  Glucose 70 - 99 mg/dL 169  157    BUN 8 - 23 mg/dL 17  12    Creatinine 0.61 - 1.24 mg/dL 1.00  0.90  1.10   Sodium 135 - 145 mmol/L 137  136    Potassium 3.5 - 5.1 mmol/L 3.7  3.9  Chloride 98 - 111 mmol/L 104  100    CO2 22 - 32 mmol/L 23  28    Calcium 8.9 - 10.3 mg/dL 8.8  9.7    Total Protein 6.5 - 8.1 g/dL 6.9  8.3    Total Bilirubin 0.3 - 1.2 mg/dL 0.6  0.7    Alkaline Phos 38 - 126 U/L 95  102    AST 15 - 41 U/L 17  21    ALT 0 - 44 U/L 19  27      DIAGNOSTIC IMAGING:  I have independently reviewed the scans and discussed with the patient. CT Chest W Contrast  Result Date: 12/18/2021 CLINICAL DATA:  Non-small cell lung cancer, monitor. Diagnosis in 2019 status post SB RT. * Tracking Code: BO * EXAM: CT CHEST WITH CONTRAST TECHNIQUE: Multidetector CT imaging of the chest was  performed during intravenous contrast administration. RADIATION DOSE REDUCTION: This exam was performed according to the departmental dose-optimization program which includes automated exposure control, adjustment of the mA and/or kV according to patient size and/or use of iterative reconstruction technique. CONTRAST:  12mL OMNIPAQUE IOHEXOL 300 MG/ML  SOLN COMPARISON:  Prior CTs 08/18/2021 and 06/12/2021. FINDINGS: Cardiovascular: Extensive atherosclerosis of the aorta, great vessels and coronary arteries. No acute vascular findings are identified. Probable calcifications of the aortic valve. The heart size is normal. There is no pericardial effusion. Mediastinum/Nodes: There are no enlarged mediastinal, hilar or axillary lymph nodes.Small mediastinal lymph nodes appear unchanged. The thyroid gland, trachea and esophagus demonstrate no significant findings. Lungs/Pleura: No pleural effusion or pneumothorax. Moderate centrilobular and paraseptal emphysema with diffuse central airway thickening and mild chronic dependent atelectasis or scarring in both lower lobes. Stable radiation changes and architectural distortion medially in the right upper lobe without evidence of recurrent mass lesion. No new or enlarging pulmonary nodules. Upper abdomen: No acute findings. Previous cholecystectomy and aortic endograft stenting, incompletely visualized. The visualized kidneys and adrenal glands appear unchanged. Musculoskeletal/Chest wall: There is no chest wall mass or suspicious osseous finding. Mild thoracic spondylosis. IMPRESSION: 1. Stable radiation changes posteromedially in the right upper lobe. 2. No evidence of local recurrence or metastatic disease. 3. Coronary and aortic atherosclerosis (ICD10-I70.0). Emphysema (ICD10-J43.9). Electronically Signed   By: Richardean Sale M.D.   On: 12/18/2021 13:43     ASSESSMENT:  1.  Stage I (T1CN0 M0) squamous cell carcinoma right upper lobe of the lung: -CT-guided biopsy on  03/19/2018 consistent with squamous cell carcinoma. -He was felt not to be a surgical candidate. -SBRT to the right lung lesion from 05/05/2018 through 05/12/2018, 54 Gray in 3 fractions of 18 [place] Cables. -He is continuing to smoke 2 packs of cigarettes per day. -CT chest on 04/20/2020 shows unchanged posttreatment appearance of irregular nodule of the posterior right upper lobe measuring 2.1 x 0.8 cm.  Stable small pulmonary nodules.  Unchanged prominent pretracheal lymph node measuring 2.4 x 0.9 cm.   PLAN:  1.  Stage I (T1CN0 M0) squamous cell carcinoma right upper lobe of the lung: -He is continuing to smoke about 2 to 3 packs a day. - He has cough from COPD which is stable.  Clear expectoration and no hemoptysis. - Reviewed CT chest which showed stable radiation changes in the right upper lobe posterior medially.  No evidence of local recurrence or metastatic disease. - Labs from 12/17/2021 shows normal LFTs and CBC. - We talked about smoking cessation.  He is not interested at this time. - Recommend follow-up in 6 months.  We will plan to do a CT scan of the chest without contrast along with labs.   Orders placed this encounter:  No orders of the defined types were placed in this encounter.    Derek Jack, MD [place] [phone]   I, Thana Ates, am acting as a scribe for Dr. Derek Jack.  I, Derek Jack MD, have reviewed the above documentation for accuracy and completeness, and I agree with the above.

## 2021-12-30 ENCOUNTER — Ambulatory Visit: Payer: Medicare HMO

## 2021-12-31 ENCOUNTER — Telehealth: Payer: Self-pay | Admitting: Pulmonary Disease

## 2021-12-31 DIAGNOSIS — J9611 Chronic respiratory failure with hypoxia: Secondary | ICD-10-CM

## 2021-12-31 DIAGNOSIS — J449 Chronic obstructive pulmonary disease, unspecified: Secondary | ICD-10-CM

## 2021-12-31 NOTE — Telephone Encounter (Signed)
ATC patient to find out how many liters he uses and if its only at night. VM  not set up.

## 2022-01-01 ENCOUNTER — Ambulatory Visit (INDEPENDENT_AMBULATORY_CARE_PROVIDER_SITE_OTHER): Payer: Medicare HMO

## 2022-01-01 DIAGNOSIS — J4540 Moderate persistent asthma, uncomplicated: Secondary | ICD-10-CM

## 2022-01-01 DIAGNOSIS — L570 Actinic keratosis: Secondary | ICD-10-CM | POA: Diagnosis not present

## 2022-01-01 NOTE — Telephone Encounter (Signed)
I have signed order.

## 2022-01-01 NOTE — Telephone Encounter (Signed)
Nothing further needed 

## 2022-01-01 NOTE — Telephone Encounter (Signed)
Kevin Frank states patient uses 5LO2 while sleeping.  She states they are having to change DMEs form [employer] to [employer] due to insurance issues.   Dr. Halford Chessman are you okay with [place] placing an order for patient to change DMEs for 5LO2 at night time.

## 2022-01-03 ENCOUNTER — Ambulatory Visit (INDEPENDENT_AMBULATORY_CARE_PROVIDER_SITE_OTHER): Payer: Medicare HMO | Admitting: Orthopedic Surgery

## 2022-01-03 DIAGNOSIS — M25512 Pain in left shoulder: Secondary | ICD-10-CM | POA: Diagnosis not present

## 2022-01-03 DIAGNOSIS — G8929 Other chronic pain: Secondary | ICD-10-CM | POA: Diagnosis not present

## 2022-01-03 NOTE — Progress Notes (Signed)
Chief Complaint  Patient presents with   Shoulder Pain    LT  Wants injection   Injections    LT shoulder Depo [facility] [id number]    Kevin Frank comes in requesting an injection in his left shoulder.  The shoulder looks amenable to injection  Procedure note the subacromial injection shoulder left   Verbal consent was obtained to inject the  Left   Shoulder  Timeout was completed to confirm the injection site is a subacromial space of the  left  shoulder  Medication used Depo-Medrol 40 mg and lidocaine 1% 3 cc  Anesthesia was provided by ethyl chloride  The injection was performed in the left  posterior subacromial space. After pinning the skin with alcohol and anesthetized the skin with ethyl chloride the subacromial space was injected using a 20-gauge needle. There were no complications  Sterile dressing was applied.

## 2022-01-03 NOTE — Patient Instructions (Signed)

## 2022-01-15 ENCOUNTER — Ambulatory Visit: Payer: Medicare HMO

## 2022-01-17 ENCOUNTER — Ambulatory Visit: Payer: Medicare HMO

## 2022-01-17 DIAGNOSIS — J4540 Moderate persistent asthma, uncomplicated: Secondary | ICD-10-CM

## 2022-01-20 DIAGNOSIS — E538 Deficiency of other specified B group vitamins: Secondary | ICD-10-CM | POA: Diagnosis not present

## 2022-01-20 DIAGNOSIS — Z6831 Body mass index (BMI) 31.0-31.9, adult: Secondary | ICD-10-CM | POA: Diagnosis not present

## 2022-01-20 DIAGNOSIS — J449 Chronic obstructive pulmonary disease, unspecified: Secondary | ICD-10-CM | POA: Diagnosis not present

## 2022-01-20 DIAGNOSIS — E118 Type 2 diabetes mellitus with unspecified complications: Secondary | ICD-10-CM | POA: Diagnosis not present

## 2022-01-20 DIAGNOSIS — Z0001 Encounter for general adult medical examination with abnormal findings: Secondary | ICD-10-CM | POA: Diagnosis not present

## 2022-01-20 DIAGNOSIS — Z1331 Encounter for screening for depression: Secondary | ICD-10-CM | POA: Diagnosis not present

## 2022-01-20 DIAGNOSIS — M1991 Primary osteoarthritis, unspecified site: Secondary | ICD-10-CM | POA: Diagnosis not present

## 2022-01-20 DIAGNOSIS — I10 Essential (primary) hypertension: Secondary | ICD-10-CM | POA: Diagnosis not present

## 2022-01-20 DIAGNOSIS — I2510 Atherosclerotic heart disease of native coronary artery without angina pectoris: Secondary | ICD-10-CM | POA: Diagnosis not present

## 2022-01-31 ENCOUNTER — Ambulatory Visit: Payer: Medicare HMO | Admitting: Pulmonary Disease

## 2022-01-31 ENCOUNTER — Ambulatory Visit (INDEPENDENT_AMBULATORY_CARE_PROVIDER_SITE_OTHER): Payer: Medicare HMO

## 2022-01-31 ENCOUNTER — Encounter: Payer: Self-pay | Admitting: Pulmonary Disease

## 2022-01-31 VITALS — BP 138/88 | HR 80 | Temp 98.5°F | Ht 70.0 in | Wt 218.8 lb

## 2022-01-31 DIAGNOSIS — G4734 Idiopathic sleep related nonobstructive alveolar hypoventilation: Secondary | ICD-10-CM

## 2022-01-31 DIAGNOSIS — J4540 Moderate persistent asthma, uncomplicated: Secondary | ICD-10-CM | POA: Diagnosis not present

## 2022-01-31 DIAGNOSIS — G4733 Obstructive sleep apnea (adult) (pediatric): Secondary | ICD-10-CM

## 2022-01-31 DIAGNOSIS — J9611 Chronic respiratory failure with hypoxia: Secondary | ICD-10-CM

## 2022-01-31 DIAGNOSIS — Z720 Tobacco use: Secondary | ICD-10-CM

## 2022-01-31 DIAGNOSIS — J449 Chronic obstructive pulmonary disease, unspecified: Secondary | ICD-10-CM | POA: Diagnosis not present

## 2022-01-31 DIAGNOSIS — Z9989 Dependence on other enabling machines and devices: Secondary | ICD-10-CM | POA: Diagnosis not present

## 2022-01-31 NOTE — Patient Instructions (Signed)
Check your oxygen concentrator setting at home and call to let us know how much this is set at  Follow up in 1 year

## 2022-01-31 NOTE — Progress Notes (Addendum)
Kevin Kevin, Kevin Kevin, Kevin Kevin  Chief Complaint  Patient presents with   Follow-up    CPAP working well.     Past Surgical Kevin:  He  has a past surgical Kevin that includes Cholecystectomy (1997); Transurethral resection of prostate (01/07/2011); Cardiac catheterization (N/A, 04/10/2016); Abdominal aortic endovascular fenestrated stent graft (N/A, 04/09/2016); Colonoscopy (2006); Colonoscopy with propofol (N/A, 11/24/2019); Kevin Kevin (11/24/2019).  Past Medical Kevin:  AAA, BPH, Osteoarthritis, DM type 2, Gout, Nephrolithiasis, HLD, HTN, Lung cancer 2019  Constitutional:  BP 138/88 (BP Location: Left Arm, Patient Position: Sitting)   Pulse 80   Temp 98.5 F (36.9 C) (Temporal)   Ht 5\' 10"  (1.778 m)   Wt 218 lb 12.8 oz (99.2 kg)   SpO2 94% Comment: ra  BMI 31.39 kg/m   Brief Summary:  Kevin Kevin is a 81 y.o. male smoker with obstructive sleep apnea, COPD with asthma, Kevin Kevin of lung cancer.      Subjective:   He was followed by Dr. Melvyn Novas.  He sees Dr. Ernst Bowler.  He is here with his wife.  Using CPAP nightly.  No issues with mask fit.  Sleeping well.  Still smoking same amount.  Not having cough, wheeze, or sputum.  He maintained SpO2 > 89% on room air while walking 3 laps in office today.  Physical Exam:   Appearance - well kempt   ENMT - no sinus tenderness, no oral exudate, no LAN, Mallampati 3 airway, no stridor, poor hearing  Respiratory - decreased breath sounds bilaterally, no wheezing or rales  CV - s1s2 regular rate Kevin Kevin, no murmurs  Ext - no clubbing, no edema  Skin - no rashes  Psych - normal mood Kevin affect    Kevin Kevin:  PFT 04/07/18 >> FEV1 2.25 (75%), FEV1% 52, TLC 8.47 (120%), RV 4.27 (164%), DLCO 47%, +BD  Chest Imaging:  CT chest 08/18/21 >> 2 x 1 cm linear density RUL w/o change, centrilobular emphysema  Sleep Tests:  PSG 03/24/07 >> AHI 53, SpO2 low 74% Auto CPAP 12/31/21  to 01/29/22 >> used on 30 of 30 nights with average 9 hrs 55 min.  Average AHI 1.3 with median CPAP 7 Kevin 95 th percentile CPAP 10 cm H2O  Cardiac Tests:  Echo 11/15/19 >> EF 60 to 65%, grade 1 DD, mild LVH, aortic root 41 mm  Social Kevin:  He  reports that he has been smoking cigarettes. He has a 100.00 pack-year smoking Kevin. He has never used smokeless tobacco. He reports current alcohol use. He reports that he does not use drugs.  Family Kevin:  His family Kevin includes Asthma in his daughter; Cancer in his mother; Diabetes in his mother; Hypertension in his mother; Liver disease in his father; Lymphoma in his mother; Mental retardation in an other family member.     Assessment/Plan:   Obstructive sleep apnea. - he reports compliance with therapy Kevin benefit from CPAP - he uses [employer] for his DME - current CPAP ordered on 10/17/21 - continue auto CPAP 5 to 15 cm H2O  COPD with asthma. - he is followed by Dr. Salvatore Marvel with asthma/allergy for Arvid Right - reviewed his inhaler regimen - continue trelegy 100 one puff daily - prn albuterol  Nocturnal hypoxemia. - asked him to check his nocturnal oxygen settings Kevin let us know - depending on how much he is set at will determine if this can be adjusted down Kevin then repeat ONO with CPAP Kevin  lower O2 setting  Tobacco abuse. - he is not interested in quitting at this Kevin  Stage 1 Squamous cell carcinoma of Rt upper lung. - diagnosed September 2019 - s/p SBRT - followed by Dr. Delton Coombes with oncology  Kevin Kevin on Day of Examination:  35 minutes  Follow up:   Patient Instructions  Check your oxygen concentrator setting at home Kevin call to let us know how much this is set at  Follow up in 1 year  Medication List:   Allergies as of 01/31/2022       Reactions   No Known Allergies         Medication List        Accurate as of January 31, 2022 11:43 AM. If you have any questions,  ask your nurse or doctor.          albuterol (2.5 MG/3ML) 0.083% nebulizer solution Commonly known as: PROVENTIL Take 3 mLs (2.5 mg total) by nebulization every 6 (six) hours as needed for wheezing or shortness of breath.   albuterol 108 (90 Base) MCG/ACT inhaler Commonly known as: VENTOLIN HFA Use 4 puffs every 4-6 hours as needed for cough or wheeze   allopurinol 300 MG tablet Commonly known as: ZYLOPRIM Take 1 tablet (300 mg total) by mouth daily.   amLODipine 10 MG tablet Commonly known as: NORVASC Take 1 tablet (10 mg total) by mouth daily.   atorvastatin 20 MG tablet Commonly known as: LIPITOR Take 1 tablet (20 mg total) by mouth daily.   clopidogrel 75 MG tablet Commonly known as: PLAVIX Take 1 tablet (75 mg total) by mouth daily.   EPINEPHrine 0.3 mg/0.3 mL Soaj injection Commonly known as: EPI-PEN Inject 0.3 mg into the muscle as needed for anaphylaxis.   hydrALAZINE 50 MG tablet Commonly known as: APRESOLINE Take 1 tablet (50 mg total) by mouth 3 (three) times daily.   Jardiance 10 MG Tabs tablet Generic drug: empagliflozin Take 10 mg by mouth daily.   losartan 100 MG tablet Commonly known as: COZAAR TAKE 1 TABLET BY MOUTH ONCE DAILY.   metFORMIN 500 MG tablet Commonly known as: GLUCOPHAGE Take 2 tablets (1,000 mg total) by mouth 2 (two) times daily with a meal.   metoprolol succinate 25 MG 24 hr tablet Commonly known as: TOPROL-XL Take 1 tablet (25 mg total) by mouth daily.   omalizumab 150 MG/ML prefilled syringe Commonly known as: Xolair Inject 375 mg into the skin every 14 (fourteen) days. Dispense 4 150mg  syringes Kevin 2 75mg  syringes for 28 days supply   sertraline 50 MG tablet Commonly known as: ZOLOFT Take 1 tablet (50 mg total) by mouth daily.   torsemide 20 MG tablet Commonly known as: DEMADEX Take 1 tablet (20 mg total) by mouth daily.   Trelegy Ellipta 100-62.5-25 MCG/ACT Aepb Generic drug: Fluticasone-Umeclidin-Vilant Inhale 1  puff into the lungs daily.        Signature:  Chesley Mires, MD [place] Pager - [phone] 01/31/2022, 11:43 AM

## 2022-02-14 ENCOUNTER — Ambulatory Visit: Payer: Medicare HMO

## 2022-02-19 ENCOUNTER — Ambulatory Visit (INDEPENDENT_AMBULATORY_CARE_PROVIDER_SITE_OTHER): Payer: Medicare HMO

## 2022-02-19 DIAGNOSIS — J4540 Moderate persistent asthma, uncomplicated: Secondary | ICD-10-CM

## 2022-02-20 ENCOUNTER — Ambulatory Visit (INDEPENDENT_AMBULATORY_CARE_PROVIDER_SITE_OTHER): Payer: Medicare HMO | Admitting: Orthopedic Surgery

## 2022-02-20 DIAGNOSIS — G8929 Other chronic pain: Secondary | ICD-10-CM | POA: Diagnosis not present

## 2022-02-20 DIAGNOSIS — M25562 Pain in left knee: Secondary | ICD-10-CM | POA: Diagnosis not present

## 2022-02-20 MED ORDER — METHYLPREDNISOLONE ACETATE 40 MG/ML IJ SUSP
40.0000 mg | Freq: Once | INTRAMUSCULAR | Status: AC
  Administered 2022-02-20: 40 mg via INTRA_ARTICULAR

## 2022-02-20 NOTE — Patient Instructions (Signed)

## 2022-02-20 NOTE — Progress Notes (Signed)
Request injection left knee   Encounter Diagnosis  Name Primary?   Chronic pain of left knee Yes     Procedure note left knee injection   verbal consent was obtained to inject left knee joint  Timeout was completed to confirm the site of injection  The medications used were depomedrol 40 mg and 1% lidocaine 3 cc Anesthesia was provided by ethyl chloride and the skin was prepped with alcohol.  After cleaning the skin with alcohol a 20-gauge needle was used to inject the left knee joint. There were no complications. A sterile bandage was applied.

## 2022-03-03 ENCOUNTER — Ambulatory Visit (INDEPENDENT_AMBULATORY_CARE_PROVIDER_SITE_OTHER): Payer: Medicare HMO | Admitting: *Deleted

## 2022-03-03 DIAGNOSIS — J4540 Moderate persistent asthma, uncomplicated: Secondary | ICD-10-CM | POA: Diagnosis not present

## 2022-03-04 ENCOUNTER — Telehealth: Payer: Self-pay | Admitting: Orthopedic Surgery

## 2022-03-04 NOTE — Telephone Encounter (Signed)
Patient question upon walking in to office requesting a left knee injection. Relayed Dr Aline Brochure is in surgery. Also, per Abigail Butts, as per chart notes, patient just had an injection in this knee 02/20/22.  May we schedule for 1 month from this date? Any other advice as patient was limping  and having hard time walking on left leg.

## 2022-03-04 NOTE — Telephone Encounter (Signed)
9/30 is ok

## 2022-03-04 NOTE — Telephone Encounter (Signed)
Called patient to offer.

## 2022-03-05 ENCOUNTER — Encounter: Payer: Self-pay | Admitting: *Deleted

## 2022-03-06 DIAGNOSIS — M1712 Unilateral primary osteoarthritis, left knee: Secondary | ICD-10-CM | POA: Diagnosis not present

## 2022-03-06 DIAGNOSIS — Z6831 Body mass index (BMI) 31.0-31.9, adult: Secondary | ICD-10-CM | POA: Diagnosis not present

## 2022-03-19 ENCOUNTER — Ambulatory Visit (INDEPENDENT_AMBULATORY_CARE_PROVIDER_SITE_OTHER): Payer: Medicare HMO

## 2022-03-19 DIAGNOSIS — J4540 Moderate persistent asthma, uncomplicated: Secondary | ICD-10-CM

## 2022-03-21 ENCOUNTER — Encounter: Payer: Self-pay | Admitting: Orthopedic Surgery

## 2022-03-21 ENCOUNTER — Ambulatory Visit: Payer: Medicare HMO | Admitting: Orthopedic Surgery

## 2022-03-21 DIAGNOSIS — G8929 Other chronic pain: Secondary | ICD-10-CM

## 2022-03-21 DIAGNOSIS — M25562 Pain in left knee: Secondary | ICD-10-CM | POA: Diagnosis not present

## 2022-03-21 DIAGNOSIS — M25512 Pain in left shoulder: Secondary | ICD-10-CM | POA: Diagnosis not present

## 2022-03-21 MED ORDER — METHYLPREDNISOLONE ACETATE 40 MG/ML IJ SUSP: 40.0000 mg | Freq: Once | INTRAMUSCULAR | Status: DC

## 2022-03-21 NOTE — Progress Notes (Signed)
Encounter Diagnoses  Name Primary?   Chronic pain of left knee Yes   Chronic left shoulder pain     81 year old male history of left knee pain and left shoulder pain had scheduled a requested injection but says he got to injections in his left hip with intramuscular steroids and has made significant improvement and now declines any injection we will follow-up as needed

## 2022-03-21 NOTE — Progress Notes (Signed)
Patient states he feels better / declines injection.

## 2022-03-31 ENCOUNTER — Ambulatory Visit: Payer: Medicare HMO

## 2022-04-04 ENCOUNTER — Ambulatory Visit (INDEPENDENT_AMBULATORY_CARE_PROVIDER_SITE_OTHER): Payer: Medicare HMO

## 2022-04-04 DIAGNOSIS — J4540 Moderate persistent asthma, uncomplicated: Secondary | ICD-10-CM

## 2022-04-18 ENCOUNTER — Ambulatory Visit (INDEPENDENT_AMBULATORY_CARE_PROVIDER_SITE_OTHER): Payer: Medicare HMO

## 2022-04-18 DIAGNOSIS — J4540 Moderate persistent asthma, uncomplicated: Secondary | ICD-10-CM | POA: Diagnosis not present

## 2022-05-05 ENCOUNTER — Ambulatory Visit (INDEPENDENT_AMBULATORY_CARE_PROVIDER_SITE_OTHER): Payer: Medicare HMO

## 2022-05-05 DIAGNOSIS — J4540 Moderate persistent asthma, uncomplicated: Secondary | ICD-10-CM | POA: Diagnosis not present

## 2022-05-19 ENCOUNTER — Ambulatory Visit (INDEPENDENT_AMBULATORY_CARE_PROVIDER_SITE_OTHER): Payer: Medicare HMO

## 2022-05-19 DIAGNOSIS — J4540 Moderate persistent asthma, uncomplicated: Secondary | ICD-10-CM

## 2022-05-23 ENCOUNTER — Telehealth: Payer: Self-pay | Admitting: Orthopedic Surgery

## 2022-05-23 NOTE — Telephone Encounter (Signed)
Patient stopped by the office, [place] stopped by and invited all the staff to a stew 06/28/22 at 4pm - [phone]. He wanted me to make sure you got the message :)

## 2022-06-02 ENCOUNTER — Ambulatory Visit (INDEPENDENT_AMBULATORY_CARE_PROVIDER_SITE_OTHER): Payer: Medicare HMO

## 2022-06-02 DIAGNOSIS — J4540 Moderate persistent asthma, uncomplicated: Secondary | ICD-10-CM

## 2022-06-18 ENCOUNTER — Ambulatory Visit (INDEPENDENT_AMBULATORY_CARE_PROVIDER_SITE_OTHER): Payer: Medicare HMO

## 2022-06-18 DIAGNOSIS — J4540 Moderate persistent asthma, uncomplicated: Secondary | ICD-10-CM | POA: Diagnosis not present

## 2022-06-25 ENCOUNTER — Other Ambulatory Visit: Payer: Self-pay

## 2022-06-25 ENCOUNTER — Encounter: Payer: Self-pay | Admitting: Allergy & Immunology

## 2022-06-25 ENCOUNTER — Ambulatory Visit (INDEPENDENT_AMBULATORY_CARE_PROVIDER_SITE_OTHER): Payer: Medicare HMO | Admitting: Allergy & Immunology

## 2022-06-25 VITALS — BP 138/80 | HR 90 | Temp 98.0°F | Resp 20 | Ht 69.0 in | Wt 227.0 lb

## 2022-06-25 DIAGNOSIS — J3089 Other allergic rhinitis: Secondary | ICD-10-CM

## 2022-06-25 DIAGNOSIS — F17200 Nicotine dependence, unspecified, uncomplicated: Secondary | ICD-10-CM | POA: Diagnosis not present

## 2022-06-25 DIAGNOSIS — J4540 Moderate persistent asthma, uncomplicated: Secondary | ICD-10-CM | POA: Diagnosis not present

## 2022-06-25 DIAGNOSIS — J302 Other seasonal allergic rhinitis: Secondary | ICD-10-CM

## 2022-06-25 MED ORDER — TRELEGY ELLIPTA 100-62.5-25 MCG/ACT IN AEPB: 1.0000 | INHALATION_SPRAY | Freq: Every day | RESPIRATORY_TRACT | 5 refills | Status: DC

## 2022-06-25 MED ORDER — ALBUTEROL SULFATE (2.5 MG/3ML) 0.083% IN NEBU: 2.5000 mg | INHALATION_SOLUTION | Freq: Four times a day (QID) | RESPIRATORY_TRACT | 2 refills | Status: DC | PRN

## 2022-06-25 MED ORDER — PREDNISONE 10 MG PO TABS: ORAL_TABLET | ORAL | 0 refills | Status: DC

## 2022-06-25 NOTE — Progress Notes (Signed)
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 Asthma/COPD overlap syndrome with acute exacerbation - Lung testing not done today. - We are sending in a prednisone taper for you. - If you are not feeling better in 1-2 days, give [place] a call and we can send in an antibiotic.  - We will send in refills of your Trelegy.  - Continue with your Trelegy one puff once daily in addition to the oxygen at night and Xolair  - Continue Xolair injections every 2 weeks. - May use albuterol 2 puffs every 4 hours as needed for cough, wheeze, tightness in chest, shortness of breath OR may use albuterol via nebulizer 1 ampoule every 4 hours as needed for cough, wheeze, tightness in chest, or shortness of breath. - When you come in for your next Xolair injection finger COVID-19 vaccine card so we can figure out if it is time to receive your booster Asthma control goals:  Full participation in all desired activities (may need albuterol before activity) Albuterol use two time or less a week on average (not counting use with activity) Cough interfering with sleep two time or less a month Oral steroids no more than once a year No hospitalizations  2. Seasonal and perennial allergic rhinitis - May use an antihistamine such as Claritin, Allegra, Xyzal, or Zyrtec once a day as needed for runny nose  3. Return in about 4 months (around 10/24/2022).   Subjective:   Kevin Frank is a 82 y.o. male presenting today for follow up of  Chief Complaint  Patient presents with   Nasal Congestion    CHEST AND SIDE PAIN AND SOME COUGHING.   Cough     Kevin Frank has a history of the following: Patient Active Problem List   Diagnosis Date Noted   Coronary artery calcification 12/09/2021   Asthma exacerbation in COPD ([place]) 08/18/2021   Cough 07/18/2021   Asthma with COPD with exacerbation ([place]) 07/18/2021   Lobar pneumonia ([place]) 12/11/2020   Acute respiratory failure ([place]) 12/10/2020   Wrist pain, acute, right 09/07/2019   Right forearm pain 09/07/2019   Uncontrolled type 2 diabetes mellitus with hyperglycemia ([place]) 06/27/2019   Mixed hyperlipidemia 06/27/2019   Class 1 obesity due to excess calories with serious comorbidity and body mass index (BMI) of 32.0 to 32.9 in adult 06/27/2019   Diabetes mellitus without complication ([place]) 53/29/9242   Positive colorectal cancer screening using Cologuard test 06/14/2019   Squamous cell carcinoma of right lung ([place]) 03/29/2018   Seasonal and perennial allergic rhinitis 09/22/2017   Asthma-COPD overlap syndrome 09/22/2017   Obstructive sleep apnea on CPAP 04/23/2017   Chronic knee pain 04/23/2017   AKI (acute kidney injury) ([place]) 04/23/2017   Chronic heart failure with preserved ejection fraction ([place]) 04/23/2017   Cellulitis 04/23/2017   Cellulitis of left lower leg    Current smoker 03/05/2016   Incidental lung nodule, > 67mm and < 16mm 03/05/2016   Stage 2 moderate COPD by GOLD classification ([place]) 03/05/2016   AAA (abdominal aortic aneurysm) without rupture ([place]) 06/14/2015   Tobacco use 06/09/2007   OBSTRUCTIVE SLEEP APNEA 06/09/2007  HYPERCHOLESTEROLEMIA 05/06/2007   GOUT 05/06/2007   DEPRESSION 05/06/2007   Essential hypertension 05/06/2007   ABDOMINAL AORTIC ANEURYSM 05/06/2007   BENIGN PROSTATIC HYPERTROPHY, HX OF 05/06/2007    History obtained from: chart review and patient.  Thi is a 82 y.o. male presenting for a sick visit.  He was last seen in June 2023.  At that time, we did not do lung testing.  We refilled his Trelegy 1 puff once daily.  We also continue  with Xolair every 2 weeks.  He was using oxygen at night prescribed by his primary care provider.  For his allergic rhinitis, we continued with an over-the-counter antihistamine as needed.  Since last visit, he has largely done well.   Asthma/Respiratory Symptom History: He has been having some chest congestion for 2-3 days. He has not had a fever. He is having a lot of coughing. This is more clear than anything else.   He has been using his Trelegy fairly routinely. He has albuterol which he added on over the last week or so. He has not been on prednisone since the last time that we put him on that. He denies chest pain and fever with the current episode. He denies any sick contacts at all.   Allergic Rhinitis Symptom History: He has been doing the antihistamine daily. He has never used an inhaler on a routine basis.  He has not needed antibiotics at all for his symptoms.   Otherwise, there have been no changes to his past medical history, surgical history, family history, or social history.    Review of Systems  Constitutional: Negative.  Negative for chills, fever, malaise/fatigue and weight loss.  HENT: Negative.  Negative for congestion, ear discharge, ear pain and sinus pain.   Eyes:  Negative for pain, discharge and redness.  Respiratory:  Positive for cough, sputum production and shortness of breath. Negative for wheezing.   Cardiovascular: Negative.  Negative for chest pain and palpitations.  Gastrointestinal:  Negative for abdominal pain, constipation, diarrhea, heartburn, nausea and vomiting.  Skin: Negative.  Negative for itching and rash.  Neurological:  Negative for dizziness and headaches.  Endo/Heme/Allergies:  Negative for environmental allergies. Does not bruise/bleed easily.       Objective:   Blood pressure 138/80, pulse 90, temperature 98 F (36.7 C), resp. rate 20, height 5\' 9"  (1.753 m), weight 227 lb (103 kg), SpO2 98 %. Body mass index is 33.52  kg/m.    Physical Exam Vitals reviewed.  Constitutional:      Appearance: He is well-developed.     Comments: Back to his boisterous self.  Very sassy.  HENT:     Head: Normocephalic and atraumatic.     Right Ear: Tympanic membrane, ear canal and external ear normal.     Left Ear: Tympanic membrane, ear canal and external ear normal.     Nose: No nasal deformity, septal deviation, mucosal edema or rhinorrhea.     Right Turbinates: Enlarged, swollen and pale.     Left Turbinates: Enlarged, swollen and pale.     Right Sinus: No maxillary sinus tenderness or frontal sinus tenderness.     Left Sinus: No maxillary sinus tenderness or frontal sinus tenderness.     Comments: No nasal polyps.    Mouth/Throat:     Mouth: Mucous membranes are not pale and dry.     Pharynx: Uvula midline.  Eyes:     General: Lids are normal. No allergic shiner.  Right eye: No discharge.        Left eye: No discharge.     Conjunctiva/sclera: Conjunctivae normal.     Right eye: Right conjunctiva is not injected. No chemosis.    Left eye: Left conjunctiva is not injected. No chemosis.    Pupils: Pupils are equal, round, and reactive to light.  Cardiovascular:     Rate and Rhythm: Normal rate and regular rhythm.     Heart sounds: Normal heart sounds.  Pulmonary:     Effort: Pulmonary effort is normal. No tachypnea, accessory muscle usage or respiratory distress.     Breath sounds: No wheezing, rhonchi or rales.     Comments: He has some baseline expiratory wheezing. No crackles noted.  Chest:     Chest wall: No tenderness.  Lymphadenopathy:     Cervical: No cervical adenopathy.  Skin:    General: Skin is warm.     Capillary Refill: Capillary refill takes less than 2 seconds.     Coloration: Skin is not pale.     Findings: No abrasion, erythema, petechiae or rash. Rash is not papular, urticarial or vesicular.  Neurological:     Mental Status: He is alert.  Psychiatric:        Behavior: Behavior  is cooperative.      Diagnostic studies: none      Salvatore Marvel, MD  [facility]

## 2022-06-25 NOTE — Patient Instructions (Addendum)
1. Asthma/COPD overlap syndrome with acute exacerbation - Lung testing not done today. - We are sending in a prednisone taper for you. - If you are not feeling better in 1-2 days, give [place] a call and we can send in an antibiotic.  - We will send in refills of your Trelegy.  - Continue with your Trelegy one puff once daily in addition to the oxygen at night and Xolair   - Continue Xolair injections every 2 weeks. - May use albuterol 2 puffs every 4 hours as needed for cough, wheeze, tightness in chest, shortness of breath OR may use albuterol via nebulizer 1 ampoule every 4 hours as needed for cough, wheeze, tightness in chest, or shortness of breath. - When you come in for your next Xolair injection finger COVID-19 vaccine card so we can figure out if it is time to receive your booster Asthma control goals:  Full participation in all desired activities (may need albuterol before activity) Albuterol use two time or less a week on average (not counting use with activity) Cough interfering with sleep two time or less a month Oral steroids no more than once a year No hospitalizations  2. Seasonal and perennial allergic rhinitis - May use an antihistamine such as Claritin, Allegra, Xyzal, or Zyrtec once a day as needed for runny nose  3. Return in about 4 months (around 10/24/2022).    Please inform us of any Emergency Department visits, hospitalizations, or changes in symptoms. Call us before going to the ED for breathing or allergy symptoms since we might be able to fit you in for a sick visit. Feel free to contact us anytime with any questions, problems, or concerns.  It was a pleasure to see you again today! TELL THE WIFE WE SAID HELLO!!!   Websites that have reliable patient information: 1. American Academy of Asthma, Allergy, and Immunology: www.aaaai.org 2. Food Allergy Research and Education (FARE): foodallergy.org 3. Mothers of Asthmatics: http://www.asthmacommunitynetwork.org 4.  American College of Allergy, Asthma, and Immunology: www.acaai.org   COVID-19 Vaccine Information can be found at: ShippingScam.co.uk For questions related to vaccine distribution or appointments, please email vaccine@[email or web address] .com or call [phone].   We realize that you might be concerned about having an allergic reaction to the COVID19 vaccines. To help with that concern, WE ARE OFFERING THE COVID19 VACCINES IN OUR OFFICE! Ask the front desk for dates!     "Like" [place] on Facebook and Instagram for our latest updates!      A healthy democracy works best when [employer] participate! Make sure you are registered to vote! If you have moved or changed any of your contact information, you will need to get this updated before voting!  In some cases, you MAY be able to register to vote online: [email or web address]

## 2022-06-26 ENCOUNTER — Ambulatory Visit (HOSPITAL_COMMUNITY)
Admission: RE | Admit: 2022-06-26 | Discharge: 2022-06-26 | Disposition: A | Discharge status: Home / Self Care | Payer: Medicare HMO | Source: Ambulatory Visit | Attending: Hematology | Admitting: Hematology

## 2022-06-26 ENCOUNTER — Inpatient Hospital Stay: Payer: Medicare HMO | Attending: Hematology

## 2022-06-26 DIAGNOSIS — C3490 Malignant neoplasm of unspecified part of unspecified bronchus or lung: Secondary | ICD-10-CM

## 2022-06-26 DIAGNOSIS — C3411 Malignant neoplasm of upper lobe, right bronchus or lung: Secondary | ICD-10-CM | POA: Insufficient documentation

## 2022-06-26 DIAGNOSIS — J439 Emphysema, unspecified: Secondary | ICD-10-CM | POA: Diagnosis not present

## 2022-06-26 DIAGNOSIS — Z79899 Other long term (current) drug therapy: Secondary | ICD-10-CM | POA: Diagnosis not present

## 2022-06-26 DIAGNOSIS — F17210 Nicotine dependence, cigarettes, uncomplicated: Secondary | ICD-10-CM | POA: Diagnosis not present

## 2022-06-26 DIAGNOSIS — C3491 Malignant neoplasm of unspecified part of right bronchus or lung: Secondary | ICD-10-CM

## 2022-06-26 LAB — CBC WITH DIFFERENTIAL/PLATELET
Abs Immature Granulocytes: 0.02 10*3/uL (ref 0.00–0.07)
Basophils Absolute: 0.1 10*3/uL (ref 0.0–0.1)
Basophils Relative: 1 %
Eosinophils Absolute: 0.1 10*3/uL (ref 0.0–0.5)
Eosinophils Relative: 1 %
HCT: 43 % (ref 39.0–52.0)
Hemoglobin: 14.6 g/dL (ref 13.0–17.0)
Immature Granulocytes: 0 %
Lymphocytes Relative: 27 %
Lymphs Abs: 1.6 10*3/uL (ref 0.7–4.0)
MCH: 32.2 pg (ref 26.0–34.0)
MCHC: 34 g/dL (ref 30.0–36.0)
MCV: 94.7 fL (ref 80.0–100.0)
Monocytes Absolute: 0.5 10*3/uL (ref 0.1–1.0)
Monocytes Relative: 7 %
Neutro Abs: 4 10*3/uL (ref 1.7–7.7)
Neutrophils Relative %: 64 %
Platelets: 142 10*3/uL — ABNORMAL LOW (ref 150–400)
RBC: 4.54 MIL/uL (ref 4.22–5.81)
RDW: 13.7 % (ref 11.5–15.5)
WBC Morphology: REACTIVE
WBC: 6.2 10*3/uL (ref 4.0–10.5)
nRBC: 0 % (ref 0.0–0.2)

## 2022-06-26 LAB — COMPREHENSIVE METABOLIC PANEL
ALT: 17 U/L (ref 0–44)
AST: 19 U/L (ref 15–41)
Albumin: 3.4 g/dL — ABNORMAL LOW (ref 3.5–5.0)
Alkaline Phosphatase: 81 U/L (ref 38–126)
Anion gap: 9 (ref 5–15)
BUN: 12 mg/dL (ref 8–23)
CO2: 25 mmol/L (ref 22–32)
Calcium: 8.6 mg/dL — ABNORMAL LOW (ref 8.9–10.3)
Chloride: 104 mmol/L (ref 98–111)
Creatinine, Ser: 0.95 mg/dL (ref 0.61–1.24)
GFR, Estimated: >60 mL/min (ref >60)
Glucose, Bld: 183 mg/dL — ABNORMAL HIGH (ref 70–99)
Potassium: 3.5 mmol/L (ref 3.5–5.1)
Sodium: 138 mmol/L (ref 135–145)
Total Bilirubin: 0.8 mg/dL (ref 0.3–1.2)
Total Protein: 6.8 g/dL (ref 6.5–8.1)

## 2022-07-02 ENCOUNTER — Ambulatory Visit (INDEPENDENT_AMBULATORY_CARE_PROVIDER_SITE_OTHER): Payer: Medicare HMO

## 2022-07-02 DIAGNOSIS — J4540 Moderate persistent asthma, uncomplicated: Secondary | ICD-10-CM | POA: Diagnosis not present

## 2022-07-02 DIAGNOSIS — L570 Actinic keratosis: Secondary | ICD-10-CM | POA: Diagnosis not present

## 2022-07-02 DIAGNOSIS — R058 Other specified cough: Secondary | ICD-10-CM

## 2022-07-02 DIAGNOSIS — C44311 Basal cell carcinoma of skin of nose: Secondary | ICD-10-CM | POA: Diagnosis not present

## 2022-07-02 MED ORDER — AMOXICILLIN-POT CLAVULANATE 875-125 MG PO TABS: 1.0000 | ORAL_TABLET | Freq: Two times a day (BID) | ORAL | 0 refills | Status: AC

## 2022-07-02 NOTE — Progress Notes (Signed)
Patient's daughter called reporting that his cough did not improve with the prednisone from last week. It has gotten much worse. He is not having a fever at all. I ordered a CXR and sent in Augmentin. Please call to update the patient.   Salvatore Marvel, MD [facility]

## 2022-07-02 NOTE — Addendum Note (Signed)
Addended by: Valentina Shaggy on: 07/02/2022 01:47 PM   Modules accepted: Orders

## 2022-07-03 ENCOUNTER — Inpatient Hospital Stay: Payer: Medicare HMO | Admitting: Hematology

## 2022-07-03 VITALS — BP 157/80 | HR 84 | Temp 98.0°F | Resp 20 | Ht 70.0 in | Wt 222.1 lb

## 2022-07-03 DIAGNOSIS — C3491 Malignant neoplasm of unspecified part of right bronchus or lung: Secondary | ICD-10-CM

## 2022-07-03 DIAGNOSIS — F17210 Nicotine dependence, cigarettes, uncomplicated: Secondary | ICD-10-CM | POA: Diagnosis not present

## 2022-07-03 DIAGNOSIS — Z79899 Other long term (current) drug therapy: Secondary | ICD-10-CM | POA: Diagnosis not present

## 2022-07-03 DIAGNOSIS — C3411 Malignant neoplasm of upper lobe, right bronchus or lung: Secondary | ICD-10-CM | POA: Diagnosis not present

## 2022-07-03 NOTE — Progress Notes (Signed)
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  PRIOR THERAPY: SBRT 54 Gy in 3 fractions to right lung from 05/05/2018 to 05/12/2018  NGS Results: not done  CURRENT THERAPY: surveillance  BRIEF ONCOLOGIC HISTORY:  Oncology History  Squamous cell carcinoma of right lung ([facility])  03/29/2018 Initial Diagnosis   Squamous cell carcinoma of right lung ([facility])   03/29/2018 Cancer Staging   Staging form: Lung, AJCC 8th Edition - Clinical stage from 03/29/2018: Stage IA3 (cT1c, cN0, cM0) - Signed by Derek Jack, MD on 03/29/2018     CANCER STAGING:  Cancer Staging  Squamous cell carcinoma of right lung [facility]) Staging form: Lung, AJCC 8th Edition - Clinical stage from 03/29/2018: Stage IA3 (cT1c, cN0, cM0) - Signed by Derek Jack, MD on 03/29/2018   INTERVAL HISTORY:  [name], a 82 y.o. male, seen for follow-up of lung cancer.  Continues to smoke 2 to 3 packs of cigarettes per day.  Recently was diagnosed with bronchitis and is still having cough which produces clear expectoration.  He was treated with Augmentin for 7 days with prednisone and continues to use Trelegy.  REVIEW OF SYSTEMS:  Review of Systems  Constitutional:  Negative for appetite change.  Respiratory:  Positive for cough and shortness of breath. Negative for hemoptysis.   Psychiatric/Behavioral:  Positive for sleep disturbance.   All other systems reviewed and are negative.   PAST MEDICAL/SURGICAL HISTORY:  Past Medical History:  Diagnosis Date   AAA (abdominal aortic aneurysm) ([place]) 01/2008   3.2cm   Allergy    Arthritis    Asthma    BPH (benign prostatic hyperplasia)    Chronic knee pain    bilateral   COPD (chronic obstructive pulmonary disease) ([facility])     Diabetes mellitus    type II   Gout    History of kidney stones    Hypercholesterolemia    Hyperlipidemia    Hypertension    Lung cancer ([place]) 2019   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    Normal nuclear stress test 2011   Obesity    Obstructive sleep apnea on CPAP    Presence of indwelling urinary catheter    Shortness of breath    due to cigarette abuse   Tobacco abuse    Past Surgical History:  Procedure Laterality Date   ABDOMINAL AORTIC ENDOVASCULAR FENESTRATED STENT GRAFT N/A 04/09/2016   Procedure: ABDOMINAL AORTIC ENDOVASCULAR FENESTRATED STENT GRAFT with left brachial artery access using ultrasound guidance;  Surgeon: Serafina Mitchell, MD;  Location: [facility];  Service: Vascular;  Laterality: N/A;   [place]   [facility]   COLONOSCOPY  2006   2006: multiple diminutive polyps in rectum, one pedunculated polyp at 10 cm s/p snare removal. Left-sided diverticulum, pedunculated polyps at 35 cm and splenic flexure s/p snare removal. No path available.    COLONOSCOPY WITH PROPOFOL N/A 11/24/2019   Procedure: COLONOSCOPY WITH PROPOFOL;  Surgeon: Daneil Dolin, MD;  [facility];  Service: Endoscopy;  Laterality: N/A;  9:45am   PERIPHERAL VASCULAR CATHETERIZATION N/A 04/10/2016   Procedure: Renal Angiography;  Surgeon: Angelia Mould, MD;  Location: [facility];  Service: Cardiovascular;  Laterality: N/A;   POLYPECTOMY  11/24/2019   Procedure: POLYPECTOMY;  Surgeon: Daneil Dolin, MD;  [facility];  Service: Endoscopy;;   TRANSURETHRAL RESECTION OF PROSTATE  01/07/2011   Procedure: TRANSURETHRAL RESECTION OF THE PROSTATE (TURP);  Surgeon: Marissa Nestle;  Location: [facility];  Service: Urology;  Laterality: N/A;    SOCIAL HISTORY:  Social History   Socioeconomic History   Marital status: Married    Spouse name: [name]   Number of children: 2   Years of education: Not on file   Highest education level: Not on file   Occupational History   Not on file  Tobacco Use   Smoking status: Every Day    Packs/day: 2.00    Years: 50.00    Total pack years: 100.00    Types: Cigarettes   Smokeless tobacco: Never   Tobacco comments:    Smokes 2 packs a day [facility] 10/17/2021   Vaping Use   Vaping Use: Never used  Substance and Sexual Activity   Alcohol use: Yes    Alcohol/week: 0.0 standard drinks of alcohol    Comment: very little   Drug use: No   Sexual activity: Not on file  Other Topics Concern   Not on file  Social History Narrative   Lives at home with wife   Social Determinants of Health   Financial Resource Strain: Low Risk  (03/05/2022)   Overall Financial Resource Strain (CARDIA)    Difficulty of Paying Living Expenses: Not hard at all  Food Insecurity: Not on file  Transportation Needs: No Transportation Needs (03/05/2022)   PRAPARE - Hydrologist (Medical): No    Lack of Transportation (Non-Medical): No  Physical Activity: Not on file  Stress: Not on file  Social Connections: Not on file  Intimate Partner Violence: Not on file    FAMILY HISTORY:  Family History  Problem Relation Age of Onset   Liver disease Father    Lymphoma Mother    Diabetes Mother    Hypertension Mother    Cancer Mother    Mental retardation Other        sibling -- from birth    Asthma Daughter    Allergic rhinitis Neg Hx    Angioedema Neg Hx    Atopy Neg Hx    Eczema Neg Hx    Immunodeficiency Neg Hx    Colon cancer Neg Hx     CURRENT MEDICATIONS:  Current Outpatient Medications  Medication Sig Dispense Refill   albuterol (PROVENTIL) (2.5 MG/3ML) 0.083% nebulizer solution Take 3 mLs (2.5 mg total) by nebulization every 6 (six) hours as needed for wheezing or shortness of breath. 150 mL 2   albuterol (VENTOLIN HFA) 108 (90 Base) MCG/ACT inhaler Use 4 puffs every 4-6 hours as needed for cough or wheeze 18 g 2   allopurinol (ZYLOPRIM) 300 MG tablet Take 1 tablet (300 mg  total) by mouth daily. 30 tablet 1   amLODipine (NORVASC) 10 MG tablet Take 1 tablet (10 mg total) by mouth daily. 14 tablet 0   amoxicillin-clavulanate (AUGMENTIN) 875-125 MG tablet Take 1 tablet by mouth 2 (two) times daily for 7 days. 14 tablet 0   atorvastatin (LIPITOR) 20 MG tablet Take 1 tablet (20 mg total) by mouth daily. 90 tablet 1   clopidogrel (PLAVIX) 75 MG tablet Take 1 tablet (75 mg total) by mouth daily. 30 tablet 1   EPINEPHrine 0.3 mg/0.3 mL IJ SOAJ injection Inject 0.3 mg into the muscle as  needed for anaphylaxis. 1 each 1   Fluticasone-Umeclidin-Vilant (TRELEGY ELLIPTA) 100-62.5-25 MCG/ACT AEPB Inhale 1 puff into the lungs daily. 60 each 5   hydrALAZINE (APRESOLINE) 50 MG tablet Take 1 tablet (50 mg total) by mouth 3 (three) times daily. 270 tablet 3   JARDIANCE 10 MG TABS tablet Take 10 mg by mouth daily.     losartan (COZAAR) 100 MG tablet TAKE 1 TABLET BY MOUTH ONCE DAILY. 30 tablet 0   metoprolol succinate (TOPROL-XL) 25 MG 24 hr tablet Take 1 tablet (25 mg total) by mouth daily. 14 tablet 0   omalizumab (XOLAIR) 150 MG/ML prefilled syringe Inject 375 mg into the skin every 14 (fourteen) days. Dispense 4 150mg  syringes and 2 75mg  syringes for 28 days supply 5 mL 11   predniSONE (DELTASONE) 10 MG tablet Take two tablets (20mg ) twice daily for three days, then one tablet (10mg ) twice daily for three days, then STOP. 18 tablet 0   sertraline (ZOLOFT) 50 MG tablet Take 1 tablet (50 mg total) by mouth daily. 30 tablet 1   torsemide (DEMADEX) 20 MG tablet Take 1 tablet (20 mg total) by mouth daily.     metFORMIN (GLUCOPHAGE) 500 MG tablet Take 2 tablets (1,000 mg total) by mouth 2 (two) times daily with a meal. 120 tablet 0   Current Facility-Administered Medications  Medication Dose Route Frequency Provider Last Rate Last Admin   methylPREDNISolone acetate (DEPO-MEDROL) injection 40 mg  40 mg Intra-articular Once Carole Civil, MD       omalizumab Arvid Right) prefilled  syringe 375 mg  375 mg Subcutaneous Q14 Days Valentina Shaggy, MD   375 mg at 07/02/22 7353    ALLERGIES:  Allergies  Allergen Reactions   No Known Allergies     PHYSICAL EXAM:  Performance status (ECOG): 1 - Symptomatic but completely ambulatory  Vitals:   07/03/22 1546  BP: (!) 157/80  Pulse: 84  Resp: 20  Temp: 98 F (36.7 C)  SpO2: 95%   Wt Readings from Last 3 Encounters:  07/03/22 222 lb 1.6 oz (100.7 kg)  06/25/22 227 lb (103 kg)  01/31/22 218 lb 12.8 oz (99.2 kg)   Physical Exam Vitals reviewed.  Constitutional:      Appearance: Normal appearance. He is obese.  Cardiovascular:     Rate and Rhythm: Normal rate and regular rhythm.     Pulses: Normal pulses.     Heart sounds: Normal heart sounds.  Pulmonary:     Effort: Pulmonary effort is normal.     Breath sounds: Normal breath sounds.  Neurological:     General: No focal deficit present.     Mental Status: He is alert and oriented to person, place, and time.  Psychiatric:        Mood and Affect: Mood normal.        Behavior: Behavior normal.      LABORATORY DATA:  I have reviewed the labs as listed.     Latest Ref Rng & Units 06/26/2022    1:46 PM 12/17/2021    1:13 PM 08/18/2021    1:29 PM  CBC  WBC 4.0 - 10.5 K/uL 6.2  10.5  6.9   Hemoglobin 13.0 - 17.0 g/dL 14.6  16.6  17.7   Hematocrit 39.0 - 52.0 % 43.0  48.6  51.7   Platelets 150 - 400 K/uL 142  171  163       Latest Ref Rng & Units 06/26/2022    1:46 PM  12/17/2021    1:13 PM 08/18/2021    1:29 PM  CMP  Glucose 70 - 99 mg/dL 183  169  157   BUN 8 - 23 mg/dL 12  17  12    Creatinine 0.61 - 1.24 mg/dL 0.95  1.00  0.90   Sodium 135 - 145 mmol/L 138  137  136   Potassium 3.5 - 5.1 mmol/L 3.5  3.7  3.9   Chloride 98 - 111 mmol/L 104  104  100   CO2 22 - 32 mmol/L 25  23  28    Calcium 8.9 - 10.3 mg/dL 8.6  8.8  9.7   Total Protein 6.5 - 8.1 g/dL 6.8  6.9  8.3   Total Bilirubin 0.3 - 1.2 mg/dL 0.8  0.6  0.7   Alkaline Phos 38 - 126 U/L 81   95  102   AST 15 - 41 U/L 19  17  21    ALT 0 - 44 U/L 17  19  27      DIAGNOSTIC IMAGING:  I have independently reviewed the scans and discussed with the patient. CT Chest Wo Contrast  Result Date: 06/27/2022 CLINICAL DATA:  Non-small-cell lung cancer. Right upper lobe cancer 2019 status post radiation therapy. * Tracking Code: BO * EXAM: CT CHEST WITHOUT CONTRAST TECHNIQUE: Multidetector CT imaging of the chest was performed following the standard protocol without IV contrast. RADIATION DOSE REDUCTION: This exam was performed according to the departmental dose-optimization program which includes automated exposure control, adjustment of the mA and/or kV according to patient size and/or use of iterative reconstruction technique. COMPARISON:  12/17/2021 FINDINGS: Cardiovascular: Heart is nonenlarged. No pericardial effusion. Normal caliber thoracic aorta with some scattered vascular calcifications. Bovine type aortic arch. Aortic valve calcifications. Mediastinum/Nodes: On this non IV contrast exam there is no specific abnormal lymph node enlargement present in the axillary region, hilum. There are several small less than 1 cm in size in short axis mediastinal nodes, not pathologic by size criteria and similar to the previous examination. Normal caliber thoracic esophagus. Small thyroid gland. Lungs/Pleura: Emphysematous lung changes are identified diffusely greatest in the upper lung zones. No pleural effusion or pneumothorax. There are bandlike areas of opacity seen in both lung bases, likely scar or atelectasis. No discrete lung mass. There is focal slightly nodular bandlike opacity along the posteromedial right upper lobe. This is unchanged from previous. This could be the sequela of previous treatment. Upper Abdomen: In the upper abdomen the adrenal glands are slightly thickened bilaterally, unchanged from previous and nonspecific. There is an aortic endograft in place at the edge of the imaging field,  incompletely evaluated on this noncontrast chest CT. Previous cholecystectomy. Colonic diverticulosis. Prominent duodenal diverticula. Bilateral renal cystic foci are also seen incompletely evaluated but are seen is similar to the prior exam. Please correlate with previous workup and CT scan of the chest, abdomen and pelvis from 08/18/2021. Musculoskeletal: Curvature and degenerative changes along the spine. Osteopenia. IMPRESSION: No significant interval change. Stable bandlike posttreatment changes along the posteromedial right upper lobe. No new mass lesion, fluid collection or lymph node enlargement. Aortic Atherosclerosis (ICD10-I70.0) and Emphysema (ICD10-J43.9). Electronically Signed   By: Jill Side M.D.   On: 06/27/2022 15:55     ASSESSMENT:  1.  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 -He was felt not to be a surgical candidate. -SBRT to the right lung lesion from 05/05/2018  through 05/12/2018, 54 [place] in 3 fractions of [place]. -He is continuing to smoke 2 packs of cigarettes per day. -CT chest on 04/20/2020 shows unchanged posttreatment appearance of irregular nodule of the posterior right upper lobe measuring 2.1 x 0.8 cm.  Stable small pulmonary nodules.  Unchanged prominent pretracheal lymph node measuring 2.4 x 0.9 cm.   PLAN:  1.  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 - Recent bronchitis treated with Augmentin, prednisone.  Continue Trelegy. - Reviewed CT chest without contrast from 06/26/2022 which showed stable bandlike posttreatment changes along the posterior medial right upper lobe with no new masses seen.  No evidence of recurrence. - Reviewed labs from 06/26/2022 which showed normal LFTs creatinine and CBC. - Recommend follow-up in 6 months with repeat CT scan and labs.    Orders placed this encounter:  No orders of the defined types were placed in this encounter.    Derek Jack, MD [place] [phone]

## 2022-07-03 NOTE — Patient Instructions (Signed)
[facility]  Discharge Instructions  You were seen and examined today by Dr. Delton Coombes.  Dr. Delton Coombes discussed your most recent lab work and CT scan which revealed that everything looks good.  Follow-up as scheduled in 6 months with labs and scan.    Thank you for choosing [facility] to provide your oncology and hematology care.   To afford each patient quality time with our provider, please arrive at least 15 minutes before your scheduled appointment time. You may need to reschedule your appointment if you arrive late (10 or more minutes). Arriving late affects you and other patients whose appointments are after yours.  Also, if you miss three or more appointments without notifying the office, you may be dismissed from the clinic at the provider's discretion.    Again, thank you for choosing [facility].  Our hope is that these requests will decrease the amount of time that you wait before being seen by our physicians.   If you have a lab appointment with the [facility] please come in thru the Main Entrance and check in at the main information desk.           _____________________________________________________________  Should you have questions after your visit to [facility], please contact our office at [phone] and follow the prompts.  Our office hours are 8:00 a.m. to 4:30 p.m. Monday - Thursday and 8:00 a.m. to 2:30 p.m. Friday.  Please note that voicemails left after 4:00 p.m. may not be returned until the following business day.  We are closed weekends and all major holidays.  You do have access to a nurse 24-7, just call the main number to the clinic [phone] and do not press any options, hold on the line and a nurse will answer the phone.    For prescription refill requests, have your pharmacy contact our office and allow 72 hours.    Masks are optional in the cancer centers. If  you would like for your care team to wear a mask while they are taking care of you, please let them know. You may have one support person who is at least 82 years old accompany you for your appointments.

## 2022-07-14 ENCOUNTER — Ambulatory Visit (INDEPENDENT_AMBULATORY_CARE_PROVIDER_SITE_OTHER): Payer: Medicare HMO | Admitting: Orthopedic Surgery

## 2022-07-14 ENCOUNTER — Ambulatory Visit (INDEPENDENT_AMBULATORY_CARE_PROVIDER_SITE_OTHER): Payer: Medicare HMO

## 2022-07-14 ENCOUNTER — Encounter: Payer: Self-pay | Admitting: Orthopedic Surgery

## 2022-07-14 DIAGNOSIS — J4540 Moderate persistent asthma, uncomplicated: Secondary | ICD-10-CM | POA: Diagnosis not present

## 2022-07-14 DIAGNOSIS — M1A00X0 Idiopathic chronic gout, unspecified site, without tophus (tophi): Secondary | ICD-10-CM | POA: Insufficient documentation

## 2022-07-14 DIAGNOSIS — M2550 Pain in unspecified joint: Secondary | ICD-10-CM | POA: Insufficient documentation

## 2022-07-14 DIAGNOSIS — G8929 Other chronic pain: Secondary | ICD-10-CM | POA: Diagnosis not present

## 2022-07-14 DIAGNOSIS — M25562 Pain in left knee: Secondary | ICD-10-CM

## 2022-07-14 MED ORDER — METHYLPREDNISOLONE ACETATE 40 MG/ML IJ SUSP
40.0000 mg | Freq: Once | INTRAMUSCULAR | Status: AC
  Administered 2022-07-14: 40 mg via INTRA_ARTICULAR

## 2022-07-14 NOTE — Patient Instructions (Signed)

## 2022-07-14 NOTE — Progress Notes (Signed)
Chief Complaint  Patient presents with   Injections    Left knee / walked in wanting injection     Procedure note left knee injection   verbal consent was obtained to inject left knee joint  Timeout was completed to confirm the site of injection  The medications used were depomedrol 40 mg and 1% lidocaine 3 cc Anesthesia was provided by ethyl chloride and the skin was prepped with alcohol.  After cleaning the skin with alcohol a 20-gauge needle was used to inject the left knee joint. There were no complications. A sterile bandage was applied.

## 2022-07-23 DIAGNOSIS — I10 Essential (primary) hypertension: Secondary | ICD-10-CM | POA: Diagnosis not present

## 2022-07-23 DIAGNOSIS — E118 Type 2 diabetes mellitus with unspecified complications: Secondary | ICD-10-CM | POA: Diagnosis not present

## 2022-07-23 DIAGNOSIS — Z0001 Encounter for general adult medical examination with abnormal findings: Secondary | ICD-10-CM | POA: Diagnosis not present

## 2022-07-23 DIAGNOSIS — I700 Atherosclerosis of aorta: Secondary | ICD-10-CM | POA: Diagnosis not present

## 2022-07-23 DIAGNOSIS — I2510 Atherosclerotic heart disease of native coronary artery without angina pectoris: Secondary | ICD-10-CM | POA: Diagnosis not present

## 2022-07-23 DIAGNOSIS — E1165 Type 2 diabetes mellitus with hyperglycemia: Secondary | ICD-10-CM | POA: Diagnosis not present

## 2022-07-23 DIAGNOSIS — Z6831 Body mass index (BMI) 31.0-31.9, adult: Secondary | ICD-10-CM | POA: Diagnosis not present

## 2022-07-23 DIAGNOSIS — Z23 Encounter for immunization: Secondary | ICD-10-CM | POA: Diagnosis not present

## 2022-07-23 DIAGNOSIS — M109 Gout, unspecified: Secondary | ICD-10-CM | POA: Diagnosis not present

## 2022-07-23 DIAGNOSIS — Z1331 Encounter for screening for depression: Secondary | ICD-10-CM | POA: Diagnosis not present

## 2022-07-28 ENCOUNTER — Ambulatory Visit (INDEPENDENT_AMBULATORY_CARE_PROVIDER_SITE_OTHER): Payer: Medicare HMO

## 2022-07-28 DIAGNOSIS — J4540 Moderate persistent asthma, uncomplicated: Secondary | ICD-10-CM

## 2022-08-07 DIAGNOSIS — H43813 Vitreous degeneration, bilateral: Secondary | ICD-10-CM | POA: Diagnosis not present

## 2022-08-07 DIAGNOSIS — M069 Rheumatoid arthritis, unspecified: Secondary | ICD-10-CM | POA: Diagnosis not present

## 2022-08-11 ENCOUNTER — Ambulatory Visit (INDEPENDENT_AMBULATORY_CARE_PROVIDER_SITE_OTHER): Payer: Medicare HMO

## 2022-08-11 DIAGNOSIS — J4540 Moderate persistent asthma, uncomplicated: Secondary | ICD-10-CM | POA: Diagnosis not present

## 2022-08-25 ENCOUNTER — Ambulatory Visit (INDEPENDENT_AMBULATORY_CARE_PROVIDER_SITE_OTHER): Payer: Medicare HMO

## 2022-08-25 DIAGNOSIS — J4540 Moderate persistent asthma, uncomplicated: Secondary | ICD-10-CM

## 2022-09-08 ENCOUNTER — Ambulatory Visit (INDEPENDENT_AMBULATORY_CARE_PROVIDER_SITE_OTHER): Payer: Medicare HMO

## 2022-09-08 DIAGNOSIS — J4540 Moderate persistent asthma, uncomplicated: Secondary | ICD-10-CM

## 2022-09-18 DIAGNOSIS — E1159 Type 2 diabetes mellitus with other circulatory complications: Secondary | ICD-10-CM | POA: Diagnosis not present

## 2022-09-18 DIAGNOSIS — I700 Atherosclerosis of aorta: Secondary | ICD-10-CM | POA: Diagnosis not present

## 2022-09-18 DIAGNOSIS — I2510 Atherosclerotic heart disease of native coronary artery without angina pectoris: Secondary | ICD-10-CM | POA: Diagnosis not present

## 2022-09-18 DIAGNOSIS — J449 Chronic obstructive pulmonary disease, unspecified: Secondary | ICD-10-CM | POA: Diagnosis not present

## 2022-09-18 DIAGNOSIS — Z6832 Body mass index (BMI) 32.0-32.9, adult: Secondary | ICD-10-CM | POA: Diagnosis not present

## 2022-09-18 DIAGNOSIS — I10 Essential (primary) hypertension: Secondary | ICD-10-CM | POA: Diagnosis not present

## 2022-09-18 DIAGNOSIS — M1991 Primary osteoarthritis, unspecified site: Secondary | ICD-10-CM | POA: Diagnosis not present

## 2022-09-18 DIAGNOSIS — E6609 Other obesity due to excess calories: Secondary | ICD-10-CM | POA: Diagnosis not present

## 2022-09-22 ENCOUNTER — Ambulatory Visit (INDEPENDENT_AMBULATORY_CARE_PROVIDER_SITE_OTHER): Payer: Medicare HMO

## 2022-09-22 DIAGNOSIS — J4540 Moderate persistent asthma, uncomplicated: Secondary | ICD-10-CM

## 2022-10-06 ENCOUNTER — Telehealth: Payer: Self-pay | Admitting: Cardiovascular Disease

## 2022-10-06 ENCOUNTER — Ambulatory Visit: Payer: Medicare HMO

## 2022-10-06 NOTE — Telephone Encounter (Signed)
Patient's wife would like to transfer from Dr. Excell Seltzer to Dr. Wyline Mood in [place] due to [place] being taxing for them to drive. Please confirm transfer

## 2022-10-08 ENCOUNTER — Encounter: Payer: Self-pay | Admitting: Allergy & Immunology

## 2022-10-08 ENCOUNTER — Ambulatory Visit: Payer: Medicare HMO | Admitting: Allergy & Immunology

## 2022-10-08 ENCOUNTER — Other Ambulatory Visit: Payer: Self-pay

## 2022-10-08 ENCOUNTER — Ambulatory Visit (HOSPITAL_COMMUNITY)
Admission: RE | Admit: 2022-10-08 | Discharge: 2022-10-08 | Disposition: A | Discharge status: Home / Self Care | Payer: Medicare HMO | Source: Ambulatory Visit | Attending: Allergy & Immunology | Admitting: Allergy & Immunology

## 2022-10-08 VITALS — BP 138/82 | HR 90 | Temp 98.1°F | Resp 20 | Ht 70.0 in | Wt 231.0 lb

## 2022-10-08 DIAGNOSIS — J4541 Moderate persistent asthma with (acute) exacerbation: Secondary | ICD-10-CM

## 2022-10-08 DIAGNOSIS — J449 Chronic obstructive pulmonary disease, unspecified: Secondary | ICD-10-CM | POA: Diagnosis not present

## 2022-10-08 DIAGNOSIS — J811 Chronic pulmonary edema: Secondary | ICD-10-CM | POA: Diagnosis not present

## 2022-10-08 DIAGNOSIS — J439 Emphysema, unspecified: Secondary | ICD-10-CM | POA: Diagnosis not present

## 2022-10-08 DIAGNOSIS — R058 Other specified cough: Secondary | ICD-10-CM

## 2022-10-08 DIAGNOSIS — J3089 Other allergic rhinitis: Secondary | ICD-10-CM | POA: Diagnosis not present

## 2022-10-08 DIAGNOSIS — J4540 Moderate persistent asthma, uncomplicated: Secondary | ICD-10-CM

## 2022-10-08 DIAGNOSIS — J302 Other seasonal allergic rhinitis: Secondary | ICD-10-CM | POA: Diagnosis not present

## 2022-10-08 DIAGNOSIS — R059 Cough, unspecified: Secondary | ICD-10-CM | POA: Diagnosis not present

## 2022-10-08 MED ORDER — PREDNISONE 10 MG PO TABS: ORAL_TABLET | ORAL | 0 refills | Status: DC

## 2022-10-08 MED ORDER — AMOXICILLIN-POT CLAVULANATE 875-125 MG PO TABS: 1.0000 | ORAL_TABLET | Freq: Two times a day (BID) | ORAL | 0 refills | Status: AC

## 2022-10-08 NOTE — Patient Instructions (Addendum)
1. Asthma/COPD overlap syndrome with acute exacerbation - Lung testing not done today since you were not feeling well today. - We are going to get a chest X-ray to make sure he does not have a pneumonia.  - We are going to try to change to Tezspire instead of Xolair.  - Consent signed for the Tezspire. - We are sending in a prednisone taper for you. - We are also going to start Augmentin to use twice daily for ten days.  - Continue with your Trelegy one puff once daily in addition to the oxygen at night and Xolair    - May use albuterol 2 puffs every 4 hours as needed for cough, wheeze, tightness in chest, shortness of breath OR may use albuterol via nebulizer 1 ampoule every 4 hours as needed for cough, wheeze, tightness in chest, or shortness of breath. Asthma control goals:  Full participation in all desired activities (may need albuterol before activity) Albuterol use two time or less a week on average (not counting use with activity) Cough interfering with sleep two time or less a month Oral steroids no more than once a year No hospitalizations  2. Seasonal and perennial allergic rhinitis - May use an antihistamine such as Claritin, Allegra, Xyzal, or Zyrtec once a day as needed for runny nose  3. Return in about 4 months (around 02/07/2023).    Please inform us of any Emergency Department visits, hospitalizations, or changes in symptoms. Call us before going to the ED for breathing or allergy symptoms since we might be able to fit you in for a sick visit. Feel free to contact us anytime with any questions, problems, or concerns.  It was a pleasure to see you again today! TELL THE WIFE WE SAID HELLO!!!   Websites that have reliable patient information: 1. American Academy of Asthma, Allergy, and Immunology: www.aaaai.org 2. Food Allergy Research and Education (FARE): foodallergy.org 3. Mothers of Asthmatics: http://www.asthmacommunitynetwork.org 4. American College of Allergy,  Asthma, and Immunology: www.acaai.org   COVID-19 Vaccine Information can be found at: [email or web address].[email or web address] For questions related to vaccine distribution or appointments, please email [email or web address] or call [phone].   We realize that you might be concerned about having an allergic reaction to the COVID19 vaccines. To help with that concern, WE ARE OFFERING THE COVID19 VACCINES IN OUR OFFICE! Ask the front desk for dates!     "Like" [place] on Facebook and Instagram for our latest updates!      A healthy democracy works best when [employer] participate! Make sure you are registered to vote! If you have moved or changed any of your contact information, you will need to get this updated before voting!  In some cases, you MAY be able to register to vote online: [email or web address]

## 2022-10-08 NOTE — Progress Notes (Unsigned)
FOLLOW UP  Date of Service/Encounter:  10/08/22   Assessment:   Asthma-COPD overlap syndrome with acute exacerbation  Productive cough and fatigue - CXR pending   seasonal and perennial allergic rhinitis (cat, dog, cockroach, trees, weeds, and ragweed)   Current smoker   Complex medical history, including lung cancer (detected early, treated with radiation and resection alone)   Restrictive pattern of spirometry  Plan/Recommendations:   1. Asthma/COPD overlap syndrome with acute exacerbation - Lung testing not done today since you were not feeling well today. - We are going to get a chest X-ray to make sure he does not have a pneumonia.  - We are going to try to change to Tezspire instead of Xolair.  - Consent signed for the Tezspire. - We are sending in a prednisone taper for you. - We are also going to start Augmentin to use twice daily for ten days.  - Continue with your Trelegy one puff once daily in addition to the oxygen at night and Xolair - May use albuterol 2 puffs every 4 hours as needed for cough, wheeze, tightness in chest, shortness of breath OR may use albuterol via nebulizer 1 ampoule every 4 hours as needed for cough, wheeze, tightness in chest, or shortness of breath. Asthma control goals:  Full participation in all desired activities (may need albuterol before activity) Albuterol use two time or less a week on average (not counting use with activity) Cough interfering with sleep two time or less a month Oral steroids no more than once a year No hospitalizations  2. Seasonal and perennial allergic rhinitis - May use an antihistamine such as Claritin, Allegra, Xyzal, or Zyrtec once a day as needed for runny nose  3. Return in about 4 months (around 02/07/2023).   Subjective:   ITHAN GOWARD is a 82 y.o. male presenting today for follow up of  Chief Complaint  Patient presents with   asthma    Pt wife states he has been coughing a lot and tired.     SAID SCHNACK has a history of the following: Patient Active Problem List   Diagnosis Date Noted   Chronic gouty arthritis 07/14/2022   Polyarthralgia 07/14/2022   Coronary artery calcification 12/09/2021   Asthma exacerbation in COPD 08/18/2021   Cough 07/18/2021   Asthma with COPD with exacerbation 07/18/2021   Lobar pneumonia 12/11/2020   Acute respiratory failure 12/10/2020   Wrist pain, acute, right 09/07/2019   Right forearm pain 09/07/2019   Uncontrolled type 2 diabetes mellitus with hyperglycemia 06/27/2019   Mixed hyperlipidemia 06/27/2019   Class 1 obesity due to excess calories with serious comorbidity and body mass index (BMI) of 32.0 to 32.9 in adult 06/27/2019   Diabetes mellitus without complication 06/22/2019   Positive colorectal cancer screening using Cologuard test 06/14/2019   Squamous cell carcinoma of right lung 03/29/2018   Seasonal and perennial allergic rhinitis 09/22/2017   Asthma-COPD overlap syndrome 09/22/2017   Obstructive sleep apnea on CPAP 04/23/2017   Chronic knee pain 04/23/2017   AKI (acute kidney injury) 04/23/2017   Chronic heart failure with preserved ejection fraction 04/23/2017   Cellulitis 04/23/2017   Cellulitis of left lower leg    Current smoker 03/05/2016   Incidental lung nodule, > 75mm and < 1mm 03/05/2016   Stage 2 moderate COPD by GOLD classification 03/05/2016   AAA (abdominal aortic aneurysm) without rupture 06/14/2015   Tobacco use 06/09/2007   OBSTRUCTIVE SLEEP APNEA 06/09/2007   HYPERCHOLESTEROLEMIA 05/06/2007  GOUT 05/06/2007   DEPRESSION 05/06/2007   Essential hypertension 05/06/2007   ABDOMINAL AORTIC ANEURYSM 05/06/2007   BENIGN PROSTATIC HYPERTROPHY, HX OF 05/06/2007    History obtained from: chart review and patient and his wife .  Dante is a 82 y.o. male presenting for a sick visit.  He was last seen in January 2024.  At that time, we did not do lung testing.  We sent him in a prednisone taper.  We  continued him on Trelegy 200 mcg 1 puff once daily as well as oxygen and Xolair.  For his allergic rhinitis, we continue with an over-the-counter antihistamine as needed.  In the interim, he has never really been much better.  His wife made the appointment today because he has been sick "forever". He only sleeps and eats and smokes per his wife. Wife reports that he goes to bed really early. He has the CPAP and oxygen machine. He is followed by Dr. Craige Cotta. He does not have an appointment with him. Oxygen has always been at 5 per his wife. His oxygen stays around 91% and sometimes drops lower. He is coughing a lot. He does have Trelegy and he did not do it this morning,  He has not seen pulmonology recently.  He is followed by Dr. Craige Cotta.  This is who prescribes his oxygen.  His wife is wondering if he needs more oxygen.  He remains on his Trelegy.  He is up-to-date on his Xolair.  His wife is wondering if he needs an antibiotic or steroid.  He has not had a fever.  He did not do COVID testing, but we did do COVID testing today in the clinic and it was negative.  His wife has a multitude of complaints today including worsening memory as well as increasing fatigue.  They did go try to talk to his PCP yesterday, but apparently he could not be fit in.  Otherwise, there have been no changes to his past medical history, surgical history, family history, or social history.    Review of Systems  Constitutional:  Positive for malaise/fatigue. Negative for chills, fever and weight loss.  HENT: Negative.  Negative for congestion, ear discharge, ear pain and sinus pain.   Eyes:  Negative for pain, discharge and redness.  Respiratory:  Positive for cough, sputum production and shortness of breath. Negative for wheezing.   Cardiovascular: Negative.  Negative for chest pain and palpitations.  Gastrointestinal:  Negative for abdominal pain, constipation, diarrhea, heartburn, nausea and vomiting.  Skin: Negative.   Negative for itching and rash.  Neurological:  Negative for dizziness and headaches.  Endo/Heme/Allergies:  Negative for environmental allergies. Does not bruise/bleed easily.       Objective:   Blood pressure 138/82, pulse 90, temperature 98.1 F (36.7 C), resp. rate 20, height  (1.778 m), weight 231 lb (104.8 kg), SpO2 (!) 88 %. Body mass index is 33.15 kg/m.    Physical Exam Vitals reviewed.  Constitutional:      Appearance: He is well-developed.     Comments: Smiling.  Not as vivacious as he normally is.  HENT:     Head: Normocephalic and atraumatic.     Right Ear: Tympanic membrane, ear canal and external ear normal.     Left Ear: Tympanic membrane, ear canal and external ear normal.     Nose: No nasal deformity, septal deviation, mucosal edema or rhinorrhea.     Right Turbinates: Enlarged, swollen and pale.     Left Turbinates:  Enlarged, swollen and pale.     Right Sinus: No maxillary sinus tenderness or frontal sinus tenderness.     Left Sinus: No maxillary sinus tenderness or frontal sinus tenderness.     Comments: Dried rhinorrhea.    Mouth/Throat:     Mouth: Mucous membranes are not pale and dry.     Pharynx: Uvula midline.  Eyes:     General: Lids are normal. No allergic shiner.       Right eye: No discharge.        Left eye: No discharge.     Conjunctiva/sclera: Conjunctivae normal.     Right eye: Right conjunctiva is not injected. No chemosis.    Left eye: Left conjunctiva is not injected. No chemosis.    Pupils: Pupils are equal, round, and reactive to light.  Cardiovascular:     Rate and Rhythm: Normal rate and regular rhythm.     Heart sounds: Normal heart sounds.  Pulmonary:     Effort: Pulmonary effort is normal. No tachypnea, accessory muscle usage or respiratory distress.     Breath sounds: Examination of the right-upper field reveals rhonchi. Examination of the left-upper field reveals rhonchi. Examination of the right-middle field reveals  rhonchi. Examination of the left-middle field reveals rhonchi. Examination of the right-lower field reveals rhonchi. Examination of the left-lower field reveals rhonchi. Rhonchi present. No wheezing or rales.     Comments: Coarse rhonchi throughout Chest:     Chest wall: No tenderness.  Lymphadenopathy:     Cervical: No cervical adenopathy.  Skin:    General: Skin is warm.     Capillary Refill: Capillary refill takes less than 2 seconds.     Coloration: Skin is not pale.     Findings: No abrasion, erythema, petechiae or rash. Rash is not papular, urticarial or vesicular.  Neurological:     Mental Status: He is alert.  Psychiatric:        Behavior: Behavior is cooperative.      Diagnostic studies: none (order chest x-ray)  I did review the chest x-ray and it actually looks fairly decent.  His left hemidiaphragm is slightly obscured.  However, the heart border is excellent.  There is no spine sign present.  There might be some increased opacification in the left lower lobe, although this might also be where he had part of his lung removed for his lung cancer.      Malachi Bonds, MD  [facility]

## 2022-10-09 ENCOUNTER — Encounter: Payer: Self-pay | Admitting: Allergy & Immunology

## 2022-10-09 ENCOUNTER — Telehealth: Payer: Self-pay | Admitting: Allergy & Immunology

## 2022-10-09 DIAGNOSIS — R918 Other nonspecific abnormal finding of lung field: Secondary | ICD-10-CM

## 2022-10-09 NOTE — Telephone Encounter (Signed)
I did review the chest x-ray and it actually looks fairly decent.  His left hemidiaphragm is slightly obscured.  However, the heart border is excellent.  There is no spine sign present.  There might be some increased opacification in the left lower lobe, although this might also be where he had part of his lung removed for his lung cancer.  I do not think we need to do anything differently.  We did start him on an antibiotic yesterday and prednisone.  Did his wife call to make an appointment with Dr. Craige Cotta?   Malachi Bonds, MD [facility]

## 2022-10-09 NOTE — Telephone Encounter (Signed)
Wife called in and was wanting to know what the results were from Galan's chest x-ray were.  I told her from what I can see, they haven't even been read by the radiologist yet.  She would like to know if Dr. Dellis Anes can pull them up himself and look at them.

## 2022-10-10 ENCOUNTER — Other Ambulatory Visit: Payer: Self-pay

## 2022-10-10 DIAGNOSIS — H6121 Impacted cerumen, right ear: Secondary | ICD-10-CM | POA: Diagnosis not present

## 2022-10-10 DIAGNOSIS — R030 Elevated blood-pressure reading, without diagnosis of hypertension: Secondary | ICD-10-CM | POA: Diagnosis not present

## 2022-10-10 MED ORDER — TRELEGY ELLIPTA 100-62.5-25 MCG/ACT IN AEPB: 1.0000 | INHALATION_SPRAY | Freq: Every day | RESPIRATORY_TRACT | 5 refills | Status: DC

## 2022-10-10 NOTE — Telephone Encounter (Signed)
Thanks for chatting with her.   Malachi Bonds, MD [facility]

## 2022-10-13 ENCOUNTER — Ambulatory Visit: Payer: Medicare HMO

## 2022-10-14 ENCOUNTER — Other Ambulatory Visit: Payer: Self-pay

## 2022-10-16 NOTE — Addendum Note (Signed)
Addended by: Alfonse Spruce on: 10/16/2022 07:58 AM   Modules accepted: Orders

## 2022-10-16 NOTE — Telephone Encounter (Signed)
CT chest with contrast ordered. It looks like he had sone ordered for July 2024 by his oncologist, but I would rather look into this now. Routing to Dr. Ellin Saba to keep him in the loop.   Malachi Bonds, MD [facility]

## 2022-10-20 ENCOUNTER — Ambulatory Visit: Payer: Medicare HMO | Attending: Nurse Practitioner | Admitting: Nurse Practitioner

## 2022-10-20 ENCOUNTER — Encounter: Payer: Self-pay | Admitting: Nurse Practitioner

## 2022-10-20 VITALS — BP 148/86 | HR 84 | Ht 70.0 in | Wt 226.2 lb

## 2022-10-20 DIAGNOSIS — R5383 Other fatigue: Secondary | ICD-10-CM

## 2022-10-20 DIAGNOSIS — I2510 Atherosclerotic heart disease of native coronary artery without angina pectoris: Secondary | ICD-10-CM | POA: Diagnosis not present

## 2022-10-20 DIAGNOSIS — Z8679 Personal history of other diseases of the circulatory system: Secondary | ICD-10-CM

## 2022-10-20 DIAGNOSIS — Z720 Tobacco use: Secondary | ICD-10-CM

## 2022-10-20 DIAGNOSIS — I10 Essential (primary) hypertension: Secondary | ICD-10-CM | POA: Diagnosis not present

## 2022-10-20 DIAGNOSIS — Z9889 Other specified postprocedural states: Secondary | ICD-10-CM

## 2022-10-20 DIAGNOSIS — J449 Chronic obstructive pulmonary disease, unspecified: Secondary | ICD-10-CM | POA: Diagnosis not present

## 2022-10-20 DIAGNOSIS — I77819 Aortic ectasia, unspecified site: Secondary | ICD-10-CM

## 2022-10-20 DIAGNOSIS — E785 Hyperlipidemia, unspecified: Secondary | ICD-10-CM

## 2022-10-20 DIAGNOSIS — R413 Other amnesia: Secondary | ICD-10-CM

## 2022-10-20 DIAGNOSIS — R0609 Other forms of dyspnea: Secondary | ICD-10-CM | POA: Diagnosis not present

## 2022-10-20 DIAGNOSIS — C3491 Malignant neoplasm of unspecified part of right bronchus or lung: Secondary | ICD-10-CM

## 2022-10-20 DIAGNOSIS — I5032 Chronic diastolic (congestive) heart failure: Secondary | ICD-10-CM

## 2022-10-20 NOTE — Progress Notes (Unsigned)
Office Visit    Patient Name: Kevin Frank Date of Encounter: 10/20/2022  PCP:  Assunta Found, MD   [facility]  Cardiologist:  Dina Rich, MD *** Advanced Practice Provider:  Beatrice Lecher, PA-C Electrophysiologist:  None  {Press F2 to show EP APP, CHF, sleep or structural heart MD               :[id number]}  { Click here to update then REFRESH NOTE - MD (PCP) or APP (Team Member)  Change PCP Type for MD, Specialty for APP is either Cardiology or Clinical Cardiac Electrophysiology  :[id number]}  Chief Complaint    [name] is a 82 y.o. male with a hx of CAD, aortic atherosclerosis, HFpEF, ascending aortic dilatation, hypertension, hyperlipidemia, type 2 diabetes, history of tobacco abuse, asthma/COPD, history of lung cancer, abdominal aortic aneurysm, and osteoarthritis, who presents today for scheduled follow-up.  Past Medical History    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    Normal nuclear stress test 2011   Obesity    Obstructive sleep apnea on CPAP    Presence of indwelling urinary catheter    Shortness of breath    due to cigarette abuse   Tobacco abuse    Past Surgical History:  Procedure Laterality Date   ABDOMINAL AORTIC ENDOVASCULAR FENESTRATED STENT GRAFT N/A 04/09/2016   Procedure: ABDOMINAL AORTIC ENDOVASCULAR FENESTRATED STENT GRAFT with left brachial artery access using ultrasound guidance;  Surgeon: Nada Libman, MD;  Location: [facility] OR;  Service: Vascular;  Laterality: N/A;    CHOLECYSTECTOMY  1997   [facility]   COLONOSCOPY  2006   2006: multiple diminutive polyps in rectum, one pedunculated polyp at 10 cm s/p snare removal. Left-sided diverticulum, pedunculated polyps at 35 cm and splenic flexure s/p snare removal. No path available.    COLONOSCOPY WITH PROPOFOL N/A 11/24/2019   Procedure: COLONOSCOPY WITH PROPOFOL;  Surgeon: Corbin Ade, MD;  Location: [facility] ENDO SUITE;  Service: Endoscopy;  Laterality: N/A;  9:45am   PERIPHERAL VASCULAR CATHETERIZATION N/A 04/10/2016   Procedure: Renal Angiography;  Surgeon: Chuck Hint, MD;  Location: [facility] INVASIVE CV LAB;  Service: Cardiovascular;  Laterality: N/A;   POLYPECTOMY  11/24/2019   Procedure: POLYPECTOMY;  Surgeon: Corbin Ade, MD;  Location: [facility] ENDO SUITE;  Service: Endoscopy;;   TRANSURETHRAL RESECTION OF PROSTATE  01/07/2011   Procedure: TRANSURETHRAL RESECTION OF THE PROSTATE (TURP);  Surgeon: Ky Barban;  Location: [facility] ORS;  Service: Urology;  Laterality: N/A;    Allergies  Allergies  Allergen Reactions   No Known Allergies     History of Present Illness    Kevin Frank is a 82 y.o. male with a PMH as mentioned above.    Last seen by Tereso Newcomer, PA-C on December 09, 2021.  He noted fatigue majority of the time, fell asleep easily.  He was working on getting  a new CPAP machine at that time.  He noted chronic shortness of breath related to his COPD, stable.  Denied any chest pain or syncope.  Noted to be smoking 2 packs of cigarettes per day.  Today he presents for scheduled follow-up.  He states  Previous patient of Dr. Excell Seltzer.  He is requesting care closer to home, and will be seeing Dr. Wyline Mood for upcoming visit.  Today he says he is doing well.  Denies any health issues as of yet.  Continues to smoke between 2 to 3 packs/day.  Not interested in quitting.  Follows Dr. Dellis Anes with pulmonology and Dr. Myra Gianotti with vascular surgery. Denies any chest pain, worsening shortness of breath,  palpitations, syncope, presyncope, dizziness, orthopnea, PND, swelling or significant weight changes, acute bleeding, or claudication.  Wife present in the room states he has DOE, low motivation, and is concerned about possible memory concerns/ wondering about dementia.   EKGs/Labs/Other Studies Reviewed:   The following studies were reviewed today: ***  EKG:  EKG is *** ordered today.  The ekg ordered today demonstrates ***  Recent Labs: 06/26/2022: ALT 17; BUN 12; Creatinine, Ser 0.95; Hemoglobin 14.6; Platelets 142; Potassium 3.5; Sodium 138  Recent Lipid Panel    Component Value Date/Time   CHOL 117 03/23/2019 0000   TRIG 89 03/23/2019 0000   HDL 39 03/23/2019 0000   CHOLHDL 3 12/16/2010 0854   VLDL 10.6 12/16/2010 0854   LDLCALC 61 03/23/2019 0000    Risk Assessment/Calculations:  {Does this patient have ATRIAL FIBRILLATION?:[phone]}  Home Medications   Current Meds  Medication Sig   albuterol (PROVENTIL) (2.5 MG/3ML) 0.083% nebulizer solution Take 3 mLs (2.5 mg total) by nebulization every 6 (six) hours as needed for wheezing or shortness of breath.   albuterol (VENTOLIN HFA) 108 (90 Base) MCG/ACT inhaler Use 4 puffs every 4-6 hours as needed for cough or wheeze   allopurinol (ZYLOPRIM) 300 MG tablet Take 1 tablet (300 mg total) by mouth daily.   amLODipine (NORVASC) 10 MG tablet Take 1 tablet (10 mg total) by mouth daily.   amoxicillin-clavulanate (AUGMENTIN) 875-125 MG tablet Take 1 tablet by mouth 2 (two) times daily for 14 days.   atorvastatin (LIPITOR) 20 MG tablet Take 1 tablet (20 mg total) by mouth daily.   clopidogrel (PLAVIX) 75 MG tablet Take 1 tablet (75 mg total) by mouth daily.   EPINEPHrine 0.3 mg/0.3 mL IJ SOAJ injection Inject 0.3 mg into the muscle as needed for anaphylaxis.   Fluticasone-Umeclidin-Vilant (TRELEGY ELLIPTA) 100-62.5-25 MCG/ACT AEPB Inhale 1 puff into the lungs daily.   hydrALAZINE (APRESOLINE) 50 MG tablet Take 1 tablet (50 mg total) by  mouth 3 (three) times daily.   JARDIANCE 10 MG TABS tablet Take 10 mg by mouth daily.   losartan (COZAAR) 100 MG tablet TAKE 1 TABLET BY MOUTH ONCE DAILY.   losartan (COZAAR) 50 MG tablet    metFORMIN (GLUCOPHAGE) 500 MG tablet Take 2 tablets (1,000 mg total) by mouth 2 (two) times daily with a meal.   metoprolol succinate (TOPROL-XL) 25 MG 24 hr tablet Take 1 tablet (25 mg total) by mouth daily.   omalizumab Geoffry Paradise) 150 MG/ML prefilled syringe Inject 375 mg into the skin every 14 (fourteen) days. Dispense 4 150mg  syringes and 2 75mg  syringes for 28 days supply   predniSONE (DELTASONE) 10 MG tablet Take 3 tabs (30mg ) twice daily for 3 days, then 2 tabs (20mg ) twice daily for 3 days, then 1 tab (10mg ) twice daily for  3 days, then STOP.   sertraline (ZOLOFT) 50 MG tablet Take 1 tablet (50 mg total) by mouth daily.   torsemide (DEMADEX) 20 MG tablet Take 1 tablet (20 mg total) by mouth daily.   Current Facility-Administered Medications for the 10/20/22 encounter (Office Visit) with Sharlene Dory, NP  Medication   methylPREDNISolone acetate (DEPO-MEDROL) injection 40 mg   omalizumab Geoffry Paradise) prefilled syringe 375 mg     Review of Systems   ***   All other systems reviewed and are otherwise negative except as noted above.  Physical Exam    VS:  BP (!) 148/86 (BP Location: Left Arm, Patient Position: Sitting, Cuff Size: Large)   Pulse 84   Ht 5\' 10"  (1.778 m)   Wt 226 lb 3.2 oz (102.6 kg)   SpO2 92%   BMI 32.46 kg/m  , BMI Body mass index is 32.46 kg/m.  Wt Readings from Last 3 Encounters:  10/20/22 226 lb 3.2 oz (102.6 kg)  10/08/22 231 lb (104.8 kg)  07/03/22 222 lb 1.6 oz (100.7 kg)     GEN: Well nourished, well developed, in no acute distress. HEENT: normal. Neck: Supple, no JVD, carotid bruits, or masses. Cardiac: ***RRR, no murmurs, rubs, or gallops. No clubbing, cyanosis, edema.  ***Radials/PT 2+ and equal bilaterally.  Respiratory:  ***Respirations regular and unlabored,  clear to auscultation bilaterally. GI: Soft, nontender, nondistended. MS: No deformity or atrophy. Skin: Warm and dry, no rash. Neuro:  Strength and sensation are intact. Psych: Normal affect.  Assessment & Plan    ***  {Are you ordering a CV Procedure (e.g. stress test, cath, DCCV, TEE, etc)?   Press F2        :[id number]}      Disposition: Follow up {follow up:15908} with Dina Rich, MD or APP.  Signed, Sharlene Dory, NP 10/20/2022, 3:19 PM [facility]

## 2022-10-20 NOTE — Patient Instructions (Signed)

## 2022-10-20 NOTE — Telephone Encounter (Signed)
PA approved. Authorization number [id number]. Spoke to [place] at [phone] [facility] Department at [facility])  LVM for patient to call the office back. If patient calls back, please inform him of approval for CT chest w/o contrast at [facility]. NO appointment has been made.

## 2022-10-21 ENCOUNTER — Other Ambulatory Visit: Payer: Self-pay

## 2022-10-21 MED ORDER — TRELEGY ELLIPTA 100-62.5-25 MCG/ACT IN AEPB: 1.0000 | INHALATION_SPRAY | Freq: Every day | RESPIRATORY_TRACT | 5 refills | Status: DC

## 2022-10-22 ENCOUNTER — Telehealth: Payer: Self-pay | Admitting: *Deleted

## 2022-10-22 NOTE — Telephone Encounter (Signed)
Thank you :)

## 2022-10-22 NOTE — Telephone Encounter (Signed)
-----   Message from Alfonse Spruce, MD sent at 10/09/2022  2:46 PM EDT ----- Change to Tezspire?

## 2022-10-22 NOTE — Telephone Encounter (Signed)
Called patient and advised will mail PAP app and be in touch regarding status

## 2022-10-23 DIAGNOSIS — I714 Abdominal aortic aneurysm, without rupture, unspecified: Secondary | ICD-10-CM | POA: Diagnosis not present

## 2022-10-23 DIAGNOSIS — E1165 Type 2 diabetes mellitus with hyperglycemia: Secondary | ICD-10-CM | POA: Diagnosis not present

## 2022-10-23 DIAGNOSIS — I5032 Chronic diastolic (congestive) heart failure: Secondary | ICD-10-CM | POA: Diagnosis not present

## 2022-10-23 DIAGNOSIS — J449 Chronic obstructive pulmonary disease, unspecified: Secondary | ICD-10-CM | POA: Diagnosis not present

## 2022-10-23 DIAGNOSIS — I10 Essential (primary) hypertension: Secondary | ICD-10-CM | POA: Diagnosis not present

## 2022-10-23 DIAGNOSIS — R5383 Other fatigue: Secondary | ICD-10-CM | POA: Diagnosis not present

## 2022-10-23 DIAGNOSIS — I700 Atherosclerosis of aorta: Secondary | ICD-10-CM | POA: Diagnosis not present

## 2022-10-23 DIAGNOSIS — E1159 Type 2 diabetes mellitus with other circulatory complications: Secondary | ICD-10-CM | POA: Diagnosis not present

## 2022-10-23 DIAGNOSIS — R269 Unspecified abnormalities of gait and mobility: Secondary | ICD-10-CM | POA: Diagnosis not present

## 2022-10-24 ENCOUNTER — Ambulatory Visit: Payer: Medicare HMO | Admitting: Family Medicine

## 2022-10-24 NOTE — Telephone Encounter (Signed)
Called patient again to try and find out status of appointment for CT chest w/o contrast at [facility]. Phone call could not be completed at this time per message.

## 2022-10-27 ENCOUNTER — Other Ambulatory Visit: Payer: Self-pay

## 2022-10-27 MED ORDER — TRELEGY ELLIPTA 100-62.5-25 MCG/ACT IN AEPB: 1.0000 | INHALATION_SPRAY | Freq: Every day | RESPIRATORY_TRACT | 0 refills | Status: DC

## 2022-10-28 ENCOUNTER — Telehealth: Payer: Self-pay | Admitting: Allergy & Immunology

## 2022-10-28 NOTE — Telephone Encounter (Signed)
Patient wife called and stated she was concerned about the medication he will be put on. Requested a call back.

## 2022-10-28 NOTE — Telephone Encounter (Signed)
Called patient's wife, Okey Regal - DOB/ need DPR - advised of process of Tezspire getting approve, then delivered to office for him to come and receive his injection.  Spouse was advised she will receive a call from [facility], Automotive engineer, once she gets the injection approved through their insurance, Babette Relic will contact them regarding shipment/delivery of the biologic and scheduling his injection.  Spouse verbalized understanding, no further questions.

## 2022-10-29 ENCOUNTER — Telehealth: Payer: Self-pay | Admitting: *Deleted

## 2022-10-29 NOTE — Telephone Encounter (Signed)
Called and spoke w/ pt wife states that pts O2 has been running "low", I asked wife what the lowest sat was and she reports that it was "92%". She states that pt has been wanting to sleep increasingly and wake up up @ 11pm to drink coffee.   Denies fever, coughing or worsening SOB from baseline. I asked wife if she had contacted PCP about these concerns and she denied.   Dr.Sood I have attached pt DL for CPAP compliance. Please advise

## 2022-10-29 NOTE — Telephone Encounter (Signed)
Patient's wife states that patient has been feeling lethargic and low energy lately and she thinks it may be due to his oxygen.   Please call and advise patient [phone]

## 2022-10-30 NOTE — Telephone Encounter (Signed)
CPAP download looks okay.  Please schedule him for ROV today or tomorrow with an APP to assess further.

## 2022-10-30 NOTE — Telephone Encounter (Signed)
ATC pt/wife to offer appt no answer unable to LVM

## 2022-10-31 ENCOUNTER — Other Ambulatory Visit: Payer: Self-pay

## 2022-10-31 ENCOUNTER — Encounter: Payer: Self-pay | Admitting: Allergy & Immunology

## 2022-10-31 ENCOUNTER — Ambulatory Visit: Payer: Medicare HMO | Admitting: Allergy & Immunology

## 2022-10-31 VITALS — BP 156/72 | HR 81 | Temp 98.2°F | Resp 16 | Wt 225.5 lb

## 2022-10-31 DIAGNOSIS — R400 Somnolence: Secondary | ICD-10-CM | POA: Diagnosis not present

## 2022-10-31 DIAGNOSIS — J4541 Moderate persistent asthma with (acute) exacerbation: Secondary | ICD-10-CM

## 2022-10-31 DIAGNOSIS — R058 Other specified cough: Secondary | ICD-10-CM

## 2022-10-31 DIAGNOSIS — R530 Neoplastic (malignant) related fatigue: Secondary | ICD-10-CM | POA: Diagnosis not present

## 2022-10-31 DIAGNOSIS — R918 Other nonspecific abnormal finding of lung field: Secondary | ICD-10-CM | POA: Diagnosis not present

## 2022-10-31 DIAGNOSIS — R5383 Other fatigue: Secondary | ICD-10-CM

## 2022-10-31 MED ORDER — METHYLPREDNISOLONE ACETATE 40 MG/ML IJ SUSP
40.0000 mg | Freq: Once | INTRAMUSCULAR | Status: AC
  Administered 2022-10-31: 40 mg via INTRAMUSCULAR

## 2022-10-31 NOTE — Patient Instructions (Addendum)
1. Asthma/COPD overlap syndrome with acute exacerbation - Lung testing looks a bit worse today. - Chest CT ordered for Tuesday @ 11:30 am at [facility]. - DepoMedrol given today in clinic. - Start albuterol mixed with budesonide twice daily via nebulizer for now (in addition to the [facility]). - I talked to Tammy and the TEZSPIRE is not quite approved yet (this is the new injection that will replace the Xolair).  - Continue with your Trelegy one puff once daily in addition to the oxygen at night   - May use albuterol 2 puffs every 4 hours as needed for cough, wheeze, tightness in chest, shortness of breath OR may use albuterol via nebulizer 1 ampoule every 4 hours as needed for cough, wheeze, tightness in chest, or shortness of breath. Asthma control goals:  Full participation in all desired activities (may need albuterol before activity) Albuterol use two time or less a week on average (not counting use with activity) Cough interfering with sleep two time or less a month Oral steroids no more than once a year No hospitalizations  2. Seasonal and perennial allergic rhinitis - May use an antihistamine such as Claritin, Allegra, Xyzal, or Zyrtec once a day as needed for runny nose  3. Return in about 2 months (around 12/31/2022).    Please inform us of any Emergency Department visits, hospitalizations, or changes in symptoms. Call us before going to the ED for breathing or allergy symptoms since we might be able to fit you in for a sick visit. Feel free to contact us anytime with any questions, problems, or concerns.  It was a pleasure to see you again today!   Websites that have reliable patient information: 1. American Academy of Asthma, Allergy, and Immunology: www.aaaai.org 2. Food Allergy Research and Education (FARE): foodallergy.org 3. Mothers of Asthmatics: http://www.asthmacommunitynetwork.org 4. American College of Allergy, Asthma, and Immunology:  www.acaai.org   COVID-19 Vaccine Information can be found at: [email or web address].[email or web address] For questions related to vaccine distribution or appointments, please email [email or web address] or call [phone].   We realize that you might be concerned about having an allergic reaction to the COVID19 vaccines. To help with that concern, WE ARE OFFERING THE COVID19 VACCINES IN OUR OFFICE! Ask the front desk for dates!     "Like" [place] on Facebook and Instagram for our latest updates!      A healthy democracy works best when [employer] participate! Make sure you are registered to vote! If you have moved or changed any of your contact information, you will need to get this updated before voting!  In some cases, you MAY be able to register to vote online: [email or web address]

## 2022-10-31 NOTE — Progress Notes (Signed)
FOLLOW UP  Date of Service/Encounter:  10/31/22   Assessment:   Asthma-COPD overlap syndrome with acute exacerbation   Productive cough and fatigue - with a chest x-ray that demonstrates a new left-sided nodule (chest CT scheduled for next week)    Seasonal and perennial allergic rhinitis (cat, dog, cockroach, trees, weeds, and ragweed)  Fatigue - normal vitamin B12 levels   Current smoker   Complex medical history, including lung cancer (detected early, treated with radiation and resection alone)   Restrictive pattern of spirometry  Plan/Recommendations:   1. Asthma/COPD overlap syndrome with acute exacerbation - Lung testing looks a bit worse today. - Chest CT ordered for Tuesday @ 11:30 am at [facility]. - DepoMedrol given today in clinic. - Start albuterol mixed with budesonide twice daily via nebulizer for now (in addition to the [facility]). - I talked to Tammy and the TEZSPIRE is not quite approved yet (this is the new injection that will replace the Xolair).  - Continue with your Trelegy one puff once daily in addition to the oxygen at night - May use albuterol 2 puffs every 4 hours as needed for cough, wheeze, tightness in chest, shortness of breath OR may use albuterol via nebulizer 1 ampoule every 4 hours as needed for cough, wheeze, tightness in chest, or shortness of breath. Asthma control goals:  Full participation in all desired activities (may need albuterol before activity) Albuterol use two time or less a week on average (not counting use with activity) Cough interfering with sleep two time or less a month Oral steroids no more than once a year No hospitalizations  2. Seasonal and perennial allergic rhinitis - May use an antihistamine such as Claritin, Allegra, Xyzal, or Zyrtec once a day as needed for runny nose  3. Return in about 2 months (around 12/31/2022).     Subjective:   Kevin Frank is a 82 y.o. male presenting today for follow up  of  Chief Complaint  Patient presents with   Cough    Kevin Frank has a history of the following: Patient Active Problem List   Diagnosis Date Noted   Chronic gouty arthritis 07/14/2022   Polyarthralgia 07/14/2022   Coronary artery calcification 12/09/2021   Asthma exacerbation in COPD (HCC) 08/18/2021   Cough 07/18/2021   Asthma with COPD with exacerbation (HCC) 07/18/2021   Lobar pneumonia (HCC) 12/11/2020   Acute respiratory failure (HCC) 12/10/2020   Wrist pain, acute, right 09/07/2019   Right forearm pain 09/07/2019   Uncontrolled type 2 diabetes mellitus with hyperglycemia (HCC) 06/27/2019   Mixed hyperlipidemia 06/27/2019   Class 1 obesity due to excess calories with serious comorbidity and body mass index (BMI) of 32.0 to 32.9 in adult 06/27/2019   Diabetes mellitus without complication (HCC) 06/22/2019   Positive colorectal cancer screening using Cologuard test 06/14/2019   Squamous cell carcinoma of right lung (HCC) 03/29/2018   Seasonal and perennial allergic rhinitis 09/22/2017   Asthma-COPD overlap syndrome 09/22/2017   Obstructive sleep apnea on CPAP 04/23/2017   Chronic knee pain 04/23/2017   AKI (acute kidney injury) (HCC) 04/23/2017   Chronic heart failure with preserved ejection fraction (HCC) 04/23/2017   Cellulitis 04/23/2017   Cellulitis of left lower leg    Current smoker 03/05/2016   Incidental lung nodule, > 3mm and < 8mm 03/05/2016   Stage 2 moderate COPD by GOLD classification (HCC) 03/05/2016   AAA (abdominal aortic aneurysm) without rupture (HCC) 06/14/2015   Tobacco use 06/09/2007  OBSTRUCTIVE SLEEP APNEA 06/09/2007   HYPERCHOLESTEROLEMIA 05/06/2007   GOUT 05/06/2007   DEPRESSION 05/06/2007   Essential hypertension 05/06/2007   ABDOMINAL AORTIC ANEURYSM 05/06/2007   BENIGN PROSTATIC HYPERTROPHY, HX OF 05/06/2007    History obtained from: chart review and patient and his wife .  Kevin Frank is a 82 y.o. male presenting for a follow up  visit. He was last seen in April 2024. At that time, we did not do lung testing.  He was having a cough that was productive, so we did start Augmentin.  We did a chest x-ray that showed a new lesion on the right side.  We were concerned for lung cancer given his history so I ordered a chest CT.  This has not been done yet.  We made the decision to change him from Xolair to [place].  Since the last visit, he has not done well.  Mom did talk to Dr. Craige Cotta he did not seem too concerned with his pulse ox readings.  She did make an appointment to see Dr. Craige Cotta at the end of May.  He did have vitamin B12 levels sent which were normal.  This was done because of concern for fatigue.  Wife is now concerned with possible testosterone deficiency.  He has never had a testosterone checked.  Wife is wondering if I can send in his labs.  I told his wife that I do not really know how to interpret testosterone levels, but I was happy to order it and see what happens.  She is she was very concerned with this fatigue.  She tells me that he stays up all night and sleeps during the day.  He denies this.  He tells me that he drinks coffee late at night which causes him to stay awake all night.  Again, he denies this. They did get hearing aids.  He remains on the Trelegy 1 puff once daily.  He was out of this for a few days, but did get a refill on this.  He continues to have her, with some mucus production.  He does wheeze quite a bit.  He does have a nebulizer at home, which does help when he uses it.  Otherwise, there have been no changes to his past medical history, surgical history, family history, or social history.    Review of Systems  Constitutional:  Positive for malaise/fatigue. Negative for chills, fever and weight loss.  HENT: Negative.  Negative for congestion, ear discharge, ear pain and sinus pain.   Eyes:  Negative for pain, discharge and redness.  Respiratory:  Positive for cough, sputum production and  shortness of breath. Negative for wheezing.   Cardiovascular: Negative.  Negative for chest pain, palpitations and orthopnea.  Gastrointestinal:  Negative for abdominal pain, constipation, diarrhea, heartburn, nausea and vomiting.  Skin: Negative.  Negative for itching and rash.  Neurological:  Negative for dizziness and headaches.  Endo/Heme/Allergies:  Negative for environmental allergies. Does not bruise/bleed easily.       Objective:   Blood pressure (!) 156/72, pulse 81, temperature 98.2 F (36.8 C), resp. rate 16, weight 225 lb 8 oz (102.3 kg), SpO2 (!) 89 %. Body mass index is 32.36 kg/m.    Physical Exam Vitals reviewed.  Constitutional:      Appearance: He is well-developed.     Comments: Smiling.  Not as vivacious as he normally is.  HENT:     Head: Normocephalic and atraumatic.     Right Ear: Tympanic membrane, ear  canal and external ear normal.     Left Ear: Tympanic membrane, ear canal and external ear normal.     Nose: No nasal deformity, septal deviation, mucosal edema or rhinorrhea.     Right Turbinates: Enlarged, swollen and pale.     Left Turbinates: Enlarged, swollen and pale.     Right Sinus: No maxillary sinus tenderness or frontal sinus tenderness.     Left Sinus: No maxillary sinus tenderness or frontal sinus tenderness.     Comments: Dried rhinorrhea.    Mouth/Throat:     Mouth: Mucous membranes are not pale and dry.     Pharynx: Uvula midline.  Eyes:     General: Lids are normal. No allergic shiner.       Right eye: No discharge.        Left eye: No discharge.     Conjunctiva/sclera: Conjunctivae normal.     Right eye: Right conjunctiva is not injected. No chemosis.    Left eye: Left conjunctiva is not injected. No chemosis.    Pupils: Pupils are equal, round, and reactive to light.  Cardiovascular:     Rate and Rhythm: Normal rate and regular rhythm.     Heart sounds: Normal heart sounds.  Pulmonary:     Effort: Pulmonary effort is normal. No  tachypnea, accessory muscle usage or respiratory distress.     Breath sounds: Examination of the right-upper field reveals rhonchi. Examination of the left-upper field reveals rhonchi. Examination of the right-middle field reveals rhonchi. Examination of the left-middle field reveals rhonchi. Examination of the right-lower field reveals wheezing and rhonchi. Examination of the left-lower field reveals wheezing and rhonchi. Wheezing and rhonchi present. No rales.     Comments: Coarse rhonchi throughout.  There is some wheezing noted at the bases. Chest:     Chest wall: No tenderness.  Lymphadenopathy:     Cervical: No cervical adenopathy.  Skin:    General: Skin is warm.     Capillary Refill: Capillary refill takes less than 2 seconds.     Coloration: Skin is not pale.     Findings: No abrasion, erythema, petechiae or rash. Rash is not papular, urticarial or vesicular.  Neurological:     Mental Status: He is alert.  Psychiatric:        Behavior: Behavior is cooperative.      Diagnostic studies:    Spirometry: results abnormal (FEV1: 1.28/45%, FVC: 2.48/65%, FEV1/FVC: 52%).    Spirometry consistent with mixed obstructive and restrictive disease.  His FEV1 is slightly lower than that obtained at the end of last year.    Allergy Studies: none       Malachi Bonds, MD  [facility]

## 2022-11-01 LAB — TESTOSTERONE,FREE AND TOTAL

## 2022-11-03 MED ORDER — TRELEGY ELLIPTA 100-62.5-25 MCG/ACT IN AEPB: 1.0000 | INHALATION_SPRAY | Freq: Every day | RESPIRATORY_TRACT | 0 refills | Status: DC

## 2022-11-03 NOTE — Addendum Note (Signed)
Addended by: Elsworth Soho on: 11/03/2022 01:53 PM   Modules accepted: Orders

## 2022-11-04 ENCOUNTER — Ambulatory Visit (HOSPITAL_COMMUNITY)
Admission: RE | Admit: 2022-11-04 | Discharge: 2022-11-04 | Disposition: A | Discharge status: Home / Self Care | Payer: Medicare HMO | Source: Ambulatory Visit | Attending: Allergy & Immunology | Admitting: Allergy & Immunology

## 2022-11-04 DIAGNOSIS — C3490 Malignant neoplasm of unspecified part of unspecified bronchus or lung: Secondary | ICD-10-CM | POA: Diagnosis not present

## 2022-11-04 DIAGNOSIS — R918 Other nonspecific abnormal finding of lung field: Secondary | ICD-10-CM | POA: Insufficient documentation

## 2022-11-04 DIAGNOSIS — J181 Lobar pneumonia, unspecified organism: Secondary | ICD-10-CM | POA: Diagnosis not present

## 2022-11-04 LAB — POCT I-STAT CREATININE: Creatinine, Ser: 1.1 mg/dL (ref 0.61–1.24)

## 2022-11-04 MED ORDER — IOHEXOL 300 MG/ML  SOLN
75.0000 mL | Freq: Once | INTRAMUSCULAR | Status: AC | PRN
  Administered 2022-11-04: 75 mL via INTRAVENOUS

## 2022-11-05 ENCOUNTER — Telehealth: Payer: Self-pay | Admitting: Allergy & Immunology

## 2022-11-05 NOTE — Telephone Encounter (Signed)
Mr. Perelman wife Okey Regal called and wanted to know if the results have come back for the X-Ray he had yesterday, 11/04/22. She would like a call back.

## 2022-11-06 LAB — TESTOSTERONE,FREE AND TOTAL: Testosterone: 323 ng/dL (ref 264–916)

## 2022-11-06 NOTE — Telephone Encounter (Signed)
I called patient's wife and informed of creatinine and testosterone results. I informed wife that his CT results have not came back yet. I informed that we would give a call once we receive results.

## 2022-11-10 NOTE — Telephone Encounter (Signed)
Gwenyth Bender,  Kevin Frank and his wife stopped by today to check on his approval. Please give his wife a call @ [phone].   Thanks

## 2022-11-21 ENCOUNTER — Encounter: Payer: Self-pay | Admitting: Pulmonary Disease

## 2022-11-21 ENCOUNTER — Ambulatory Visit: Payer: Medicare HMO | Admitting: Pulmonary Disease

## 2022-11-21 VITALS — BP 190/75 | HR 78 | Ht 71.0 in | Wt 227.4 lb

## 2022-11-21 DIAGNOSIS — G4733 Obstructive sleep apnea (adult) (pediatric): Secondary | ICD-10-CM

## 2022-11-21 DIAGNOSIS — R413 Other amnesia: Secondary | ICD-10-CM | POA: Diagnosis not present

## 2022-11-21 DIAGNOSIS — J9611 Chronic respiratory failure with hypoxia: Secondary | ICD-10-CM

## 2022-11-21 DIAGNOSIS — Z720 Tobacco use: Secondary | ICD-10-CM

## 2022-11-21 DIAGNOSIS — G4734 Idiopathic sleep related nonobstructive alveolar hypoventilation: Secondary | ICD-10-CM | POA: Diagnosis not present

## 2022-11-21 DIAGNOSIS — J4489 Other specified chronic obstructive pulmonary disease: Secondary | ICD-10-CM | POA: Diagnosis not present

## 2022-11-21 NOTE — Telephone Encounter (Signed)
Pt and wife are coming into office 11/21/2022 @ 2:45pm

## 2022-11-21 NOTE — Telephone Encounter (Signed)
L/m for wife stillworking on PAP they are difficult and requiring documentation from [employer] on patient responsibility for medication and Ins tells me they can't send me anything

## 2022-11-21 NOTE — Patient Instructions (Signed)
Follow up in 4 months 

## 2022-11-21 NOTE — Progress Notes (Signed)
[facility]  Chief Complaint  Patient presents with   Follow-up    Past Surgical History:  He  has a past surgical history that includes Cholecystectomy (1997); Transurethral resection of prostate (01/07/2011); Cardiac catheterization (N/A, 04/10/2016); Abdominal aortic endovascular fenestrated stent graft (N/A, 04/09/2016); Colonoscopy (2006); Colonoscopy with propofol (N/A, 11/24/2019); and polypectomy (11/24/2019).  Past Medical History:  AAA, BPH, Osteoarthritis, DM type 2, Gout, Nephrolithiasis, HLD, HTN, Lung cancer 2019  Constitutional:  BP (!) 190/75   Pulse 78   Ht 5\' 11"  (1.803 m)   Wt 227 lb 6.4 oz (103.1 kg)   SpO2 90%   BMI 31.72 kg/m   Brief Summary:  Kevin Frank is a 82 y.o. male smoker with obstructive sleep apnea, COPD with asthma, and history of lung cancer.      Subjective:   He is here with his wife and sister-in-law.  He saw Dr. Dellis Anes in April.  Given prednisone taper and switched to tezspire >> hasn't been started yet.  Spirometry showed moderate COPD.  Uses CPAP nightly.  No issues with mask fit or pressure.  His sister-in-law is concerned his oxygen concentrator is too far away from his bedroom.  He continues to smoke cigarettes and has not plans to quit.  He has cough with clear sputum.  Inhalers help.  His family is worried that he is sleeping all the time and doesn't enjoy doing activities like he used to.  They are worried he is depressed or whether he is getting dementia.  He has more trouble with short term memory.    Physical Exam:   Appearance - well kempt   ENMT - no sinus tenderness, no oral exudate, no LAN, Mallampati 3 airway, no stridor  Respiratory - equal breath sounds bilaterally, no wheezing or rales  CV - s1s2 regular rate and rhythm, no murmurs  Ext - no clubbing, no edema  Skin - no rashes  Psych - normal mood and affect     Pulmonary testing:  PFT 04/07/18 >>  FEV1 2.25 (75%), FEV1% 52, TLC 8.47 (120%), RV 4.27 (164%), DLCO 47%, +BD Spirometry 11/03/22 >> FEV1 1.28 (45.39%), FEV1% 52  Chest Imaging:  CT chest 08/18/21 >> 2 x 1 cm linear density RUL w/o change, centrilobular emphysema CT chest 11/08/22 >> atherosclerosis, mod emphysema with bronchial thickening, RUL XRT changes, stable nodules  Sleep Tests:  PSG 03/24/07 >> AHI 53, SpO2 low 74% Auto CPAP 10/22/22 to 11/20/22 >> used on 30 of 30 nights with average 11 hrs.  Average AHI 0.5 with median CPAP 9 and 95 th percentile CPAP 12 cm H2O   Cardiac Tests:  Echo 11/15/19 >> EF 60 to 65%, grade 1 DD, mild LVH, aortic root 41 mm  Social History:  He  reports that he has been smoking cigarettes. He has a 100.00 pack-year smoking history. He has never been exposed to tobacco smoke. He has never used smokeless tobacco. He reports current alcohol use. He reports that he does not use drugs.  Family History:  His family history includes Asthma in his daughter; Cancer in his mother; Diabetes in his mother; Hypertension in his mother; Liver disease in his father; Lymphoma in his mother; Mental retardation in an other family member.     Assessment/Plan:   Obstructive sleep apnea. - he reports compliance with therapy and benefit from CPAP - he uses [employer] for his DME - current CPAP ordered on 10/17/21 - continue auto CPAP 5  to 15 cm H2O  COPD with asthma. - he is followed by Dr. Malachi Bonds with asthma/allergy >> recently changed from xolair to tezspire but still waiting for authorization - reviewed his inhaler regimen - continue trelegy 100 one puff daily - prn albuterol  Nocturnal hypoxemia. - continue oxygen at night with CPAP - advised it's okay to move the concentrator closer to his bedroom as long as the noise doesn't disrupt his sleep  Tobacco abuse. 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 Ellin Saba with oncology - most recent CT chest is stable  Memory deficit. - explained this could be related to depression or early stages of dementia - advised them to d/w his PCP and then determine if more neuro-psych assessment needed  Time Spent Involved in Patient Care on Day of Examination:  49 minutes  Follow up:   Patient Instructions  Follow up in 4 months  Medication List:   Allergies as of 11/21/2022       Reactions   No Known Allergies         Medication List        Accurate as of Nov 21, 2022  5:18 PM. If you have any questions, ask your nurse or doctor.          STOP taking these medications    predniSONE 10 MG tablet Commonly known as: DELTASONE Stopped by: Coralyn Helling, MD       TAKE these medications    albuterol 108 (90 Base) MCG/ACT inhaler Commonly known as: VENTOLIN HFA Use 4 puffs every 4-6 hours as needed for cough or wheeze   albuterol (2.5 MG/3ML) 0.083% nebulizer solution Commonly known as: PROVENTIL Take 3 mLs (2.5 mg total) by nebulization every 6 (six) hours as needed for wheezing or shortness of breath.   allopurinol 300 MG tablet Commonly known as: ZYLOPRIM Take 1 tablet (300 mg total) by mouth daily.   amLODipine 10 MG tablet Commonly known as: NORVASC Take 1 tablet (10 mg total) by mouth daily.   atorvastatin 20 MG tablet Commonly known as: LIPITOR Take 1 tablet (20 mg total) by mouth daily.   clopidogrel 75 MG tablet Commonly known as: PLAVIX Take 1 tablet (75 mg total) by mouth daily.   EPINEPHrine 0.3 mg/0.3 mL Soaj injection Commonly known as: EPI-PEN Inject 0.3 mg into the muscle as needed for anaphylaxis.   hydrALAZINE 50 MG tablet Commonly known as: APRESOLINE Take 1 tablet (50 mg total) by mouth 3 (three) times daily.   Jardiance 10 MG Tabs tablet Generic drug: empagliflozin Take 10 mg by mouth daily.   losartan 50 MG tablet Commonly known as: COZAAR   losartan 100 MG  tablet Commonly known as: COZAAR TAKE 1 TABLET BY MOUTH ONCE DAILY.   metFORMIN 500 MG tablet Commonly known as: GLUCOPHAGE Take 2 tablets (1,000 mg total) by mouth 2 (two) times daily with a meal.   metoprolol succinate 25 MG 24 hr tablet Commonly known as: TOPROL-XL Take 1 tablet (25 mg total) by mouth daily.   Neuriva Plus Caps Take 1 tablet by mouth daily.   omalizumab 150 MG/ML prefilled syringe Commonly known as: Xolair Inject 375 mg into the skin every 14 (fourteen) days. Dispense 4 150mg  syringes and 2 75mg  syringes for 28 days supply   sertraline 50 MG tablet Commonly known as: ZOLOFT Take 1 tablet (  50 mg total) by mouth daily.   torsemide 20 MG tablet Commonly known as: DEMADEX Take 1 tablet (20 mg total) by mouth daily.   Trelegy Ellipta 100-62.5-25 MCG/ACT Aepb Generic drug: Fluticasone-Umeclidin-Vilant Inhale 1 puff into the lungs daily.        Signature:  Coralyn Helling, MD [facility] Pulmonary/Critical Care Pager - [phone] 11/21/2022, 5:18 PM

## 2022-11-24 ENCOUNTER — Other Ambulatory Visit (HOSPITAL_COMMUNITY): Payer: Medicare HMO

## 2022-11-24 ENCOUNTER — Encounter (HOSPITAL_COMMUNITY): Payer: Medicare HMO

## 2022-11-24 ENCOUNTER — Ambulatory Visit: Payer: Medicare HMO

## 2022-11-25 NOTE — Telephone Encounter (Signed)
Patient wife called and I returned her call and l/m to reach out to me

## 2022-11-26 ENCOUNTER — Ambulatory Visit (INDEPENDENT_AMBULATORY_CARE_PROVIDER_SITE_OTHER): Payer: Medicare HMO

## 2022-11-26 DIAGNOSIS — R5383 Other fatigue: Secondary | ICD-10-CM | POA: Diagnosis not present

## 2022-11-26 DIAGNOSIS — I700 Atherosclerosis of aorta: Secondary | ICD-10-CM | POA: Diagnosis not present

## 2022-11-26 DIAGNOSIS — G4733 Obstructive sleep apnea (adult) (pediatric): Secondary | ICD-10-CM | POA: Diagnosis not present

## 2022-11-26 DIAGNOSIS — J4550 Severe persistent asthma, uncomplicated: Secondary | ICD-10-CM

## 2022-11-26 DIAGNOSIS — I5032 Chronic diastolic (congestive) heart failure: Secondary | ICD-10-CM | POA: Diagnosis not present

## 2022-11-26 DIAGNOSIS — E1165 Type 2 diabetes mellitus with hyperglycemia: Secondary | ICD-10-CM | POA: Diagnosis not present

## 2022-11-26 DIAGNOSIS — R400 Somnolence: Secondary | ICD-10-CM | POA: Diagnosis not present

## 2022-11-26 DIAGNOSIS — E1159 Type 2 diabetes mellitus with other circulatory complications: Secondary | ICD-10-CM | POA: Diagnosis not present

## 2022-11-26 DIAGNOSIS — J449 Chronic obstructive pulmonary disease, unspecified: Secondary | ICD-10-CM | POA: Diagnosis not present

## 2022-11-26 DIAGNOSIS — I714 Abdominal aortic aneurysm, without rupture, unspecified: Secondary | ICD-10-CM | POA: Diagnosis not present

## 2022-11-26 MED ORDER — TEZEPELUMAB-EKKO 210 MG/1.91ML ~~LOC~~ SOSY
210.0000 mg | PREFILLED_SYRINGE | SUBCUTANEOUS | Status: DC
  Administered 2022-11-26 – 2024-03-25 (×14): 210 mg via SUBCUTANEOUS

## 2022-11-26 NOTE — Progress Notes (Signed)
Immunotherapy   Patient Details  Name: Kevin Frank MRN: [id number] Date of Birth: 02/04/1941  11/26/2022  [name] started injections for  Tezspire Following schedule: Every twenty eight days. Frequency:Every four weeks Epi-Pen:Not needed Consent signed previously and patient instructions given. Patient and his wife did not stay for thirty minutes as they has other errands to run today.   [name] 11/26/2022, 3:52 PM

## 2022-12-03 ENCOUNTER — Other Ambulatory Visit: Payer: Self-pay | Admitting: Allergy & Immunology

## 2022-12-04 ENCOUNTER — Other Ambulatory Visit: Payer: Self-pay | Admitting: *Deleted

## 2022-12-04 DIAGNOSIS — I714 Abdominal aortic aneurysm, without rupture, unspecified: Secondary | ICD-10-CM

## 2022-12-04 DIAGNOSIS — N179 Acute kidney failure, unspecified: Secondary | ICD-10-CM

## 2022-12-08 ENCOUNTER — Ambulatory Visit: Payer: Medicare HMO | Admitting: Physician Assistant

## 2022-12-10 DIAGNOSIS — G4730 Sleep apnea, unspecified: Secondary | ICD-10-CM | POA: Diagnosis not present

## 2022-12-15 ENCOUNTER — Ambulatory Visit (HOSPITAL_COMMUNITY)
Admission: RE | Admit: 2022-12-15 | Discharge: 2022-12-15 | Disposition: A | Discharge status: Home / Self Care | Payer: Medicare HMO | Source: Ambulatory Visit | Attending: Surgery | Admitting: Surgery

## 2022-12-15 ENCOUNTER — Ambulatory Visit (INDEPENDENT_AMBULATORY_CARE_PROVIDER_SITE_OTHER)
Admission: RE | Admit: 2022-12-15 | Discharge: 2022-12-15 | Disposition: A | Discharge status: Home / Self Care | Payer: Medicare HMO | Source: Ambulatory Visit | Attending: Surgery | Admitting: Surgery

## 2022-12-15 ENCOUNTER — Ambulatory Visit: Payer: Medicare HMO | Admitting: Physician Assistant

## 2022-12-15 VITALS — BP 137/77 | HR 74 | Temp 98.0°F | Resp 18 | Ht 71.0 in

## 2022-12-15 DIAGNOSIS — I7143 Infrarenal abdominal aortic aneurysm, without rupture: Secondary | ICD-10-CM

## 2022-12-15 DIAGNOSIS — N179 Acute kidney failure, unspecified: Secondary | ICD-10-CM | POA: Insufficient documentation

## 2022-12-15 DIAGNOSIS — I714 Abdominal aortic aneurysm, without rupture, unspecified: Secondary | ICD-10-CM | POA: Diagnosis not present

## 2022-12-15 NOTE — Progress Notes (Unsigned)
Office Note   History of Present Illness   Kevin Frank is a 82 y.o. (04/03/1941) male who presents for follow up of FEVAR.  He has a history of EVAR with single right renal fenestration on 04/09/2016.  The day after his procedure he had a bump in his creatinine and ultrasound demonstrated poor perfusion to the unstented left kidney.  He subsequently went to the Cath Lab and had a stent placed in his left renal artery.  His creatinine trended down following that.  He returns today for follow-up.  His blood pressure is well-controlled.  He denies any new issues with his kidney function.  He denies any abdominal, back, or chest pain.  Current Outpatient Medications  Medication Sig Dispense Refill   albuterol (PROVENTIL) (2.5 MG/3ML) 0.083% nebulizer solution Take 3 mLs (2.5 mg total) by nebulization every 6 (six) hours as needed for wheezing or shortness of breath. 150 mL 2   albuterol (VENTOLIN HFA) 108 (90 Base) MCG/ACT inhaler Use 4 puffs every 4-6 hours as needed for cough or wheeze 18 g 2   allopurinol (ZYLOPRIM) 300 MG tablet Take 1 tablet (300 mg total) by mouth daily. 30 tablet 1   amLODipine (NORVASC) 10 MG tablet Take 1 tablet (10 mg total) by mouth daily. 14 tablet 0   atorvastatin (LIPITOR) 20 MG tablet Take 1 tablet (20 mg total) by mouth daily. 90 tablet 1   clopidogrel (PLAVIX) 75 MG tablet Take 1 tablet (75 mg total) by mouth daily. 30 tablet 1   Cobalamin Combinations (NEURIVA PLUS) CAPS Take 1 tablet by mouth daily.     EPINEPHrine 0.3 mg/0.3 mL IJ SOAJ injection Inject 0.3 mg into the muscle as needed for anaphylaxis. 1 each 1   Fluticasone-Umeclidin-Vilant (TRELEGY ELLIPTA) 100-62.5-25 MCG/ACT AEPB inhale 1 puff into the lungs daily in the afternoon. 60 each 0   hydrALAZINE (APRESOLINE) 50 MG tablet Take 1 tablet (50 mg total) by mouth 3 (three) times daily. 270 tablet 3   JARDIANCE 10 MG TABS tablet Take 10 mg by mouth daily.     losartan (COZAAR) 100 MG tablet TAKE  1 TABLET BY MOUTH ONCE DAILY. 30 tablet 0   losartan (COZAAR) 50 MG tablet      metFORMIN (GLUCOPHAGE) 500 MG tablet Take 2 tablets (1,000 mg total) by mouth 2 (two) times daily with a meal. 120 tablet 0   metoprolol succinate (TOPROL-XL) 25 MG 24 hr tablet Take 1 tablet (25 mg total) by mouth daily. 14 tablet 0   omalizumab (XOLAIR) 150 MG/ML prefilled syringe Inject 375 mg into the skin every 14 (fourteen) days. Dispense 4 150mg  syringes and 2 75mg  syringes for 28 days supply (Patient not taking: Reported on 11/21/2022) 5 mL 11   sertraline (ZOLOFT) 50 MG tablet Take 1 tablet (50 mg total) by mouth daily. 30 tablet 1   torsemide (DEMADEX) 20 MG tablet Take 1 tablet (20 mg total) by mouth daily.     Current Facility-Administered Medications  Medication Dose Route Frequency Provider Last Rate Last Admin   methylPREDNISolone acetate (DEPO-MEDROL) injection 40 mg  40 mg Intra-articular Once Vickki Hearing, MD       omalizumab Geoffry Paradise) prefilled syringe 375 mg  375 mg Subcutaneous Q14 Days Alfonse Spruce, MD   375 mg at 09/22/22 0931   tezepelumab-ekko (TEZSPIRE) 210 MG/1. syringe 210 mg  210 mg Subcutaneous Q28 days Alfonse Spruce, MD   210 mg at 11/26/22 1110    REVIEW  OF SYSTEMS (negative unless checked):   Cardiac:  []  Chest pain or chest pressure? []  Shortness of breath upon activity? []  Shortness of breath when lying flat? []  Irregular heart rhythm?  Vascular:  []  Pain in calf, thigh, or hip brought on by walking? []  Pain in feet at night that wakes you up from your sleep? []  Blood clot in your veins? []  Leg swelling?  Pulmonary:  []  Oxygen at home? []  Productive cough? []  Wheezing?  Neurologic:  []  Sudden weakness in arms or legs? []  Sudden numbness in arms or legs? []  Sudden onset of difficult speaking or slurred speech? []  Temporary loss of vision in one eye? []  Problems with dizziness?  Gastrointestinal:  []  Blood in stool? []  Vomited  blood?  Genitourinary:  []  Burning when urinating? []  Blood in urine?  Psychiatric:  []  Major depression  Hematologic:  []  Bleeding problems? []  Problems with blood clotting?  Dermatologic:  []  Rashes or ulcers?  Constitutional:  []  Fever or chills?  Ear/Nose/Throat:  []  Change in hearing? []  Nose bleeds? []  Sore throat?  Musculoskeletal:  []  Back pain? []  Joint pain? []  Muscle pain?   Physical Examination   Vitals:   12/15/22 1055  BP: 137/77  Pulse: 74  Resp: 18  Temp: 98 F (36.7 C)  TempSrc: Temporal  SpO2: 90%  Height: 5\' 11"  (1.803 m)   Body mass index is 31.72 kg/m.  General:  WDWN in NAD; vital signs documented above Gait: Not observed HENT: WNL, normocephalic Pulmonary: normal non-labored breathing , without rales, rhonchi,  wheezing Cardiac: regular Abdomen: soft, NT, no masses Skin: without rashes Vascular Exam/Pulses: Bilateral lower extremities warm well-perfused Extremities: without ischemic changes, without gangrene , without cellulitis; without open wounds;  Musculoskeletal: no muscle wasting or atrophy  Neurologic: A&O X 3;  No focal weakness or paresthesias are detected Psychiatric:  The pt has Normal affect.   Non-Invasive Vascular Imaging   EVAR Duplex (12/15/2022) Current size: 4 cm Previous size: 3.9 cm (10/07/2021)  Renal Artery Duplex Patent left renal artery stent without hemodynamically significant stenosis  Medical Decision Making   Kevin Frank is a 82 y.o. (1941-05-30) male who presents for surveillance of AAA s/p EVAR with renal stenting  Based on this patient's duplex, his EVAR is patent with a maximum diameter of 4 cm.  His renal artery stents are patent without hemodynamically significant stenosis He denies any issues with his kidney function.  He denies any abdominal, back, or chest pain. He tries to stay as active as he can.  He still takes his Plavix and statin He can follow-up with our office in 1 year  with repeat EVAR duplex   Kevin Dubonnet PA-C [facility] Office: [phone]  Clinic MD: Myra Gianotti

## 2022-12-17 ENCOUNTER — Ambulatory Visit (INDEPENDENT_AMBULATORY_CARE_PROVIDER_SITE_OTHER): Payer: Medicare HMO

## 2022-12-17 DIAGNOSIS — J4550 Severe persistent asthma, uncomplicated: Secondary | ICD-10-CM | POA: Diagnosis not present

## 2022-12-17 NOTE — Telephone Encounter (Signed)
Patient came in today to get an update on his approval for Tezspire. It still hasn't been approved per Tammy. Dr Dellis Anes gave [place] and I a verbal to moved forward with doing Xolair for now until the Tezspire can be approved.

## 2022-12-22 ENCOUNTER — Other Ambulatory Visit: Payer: Self-pay

## 2022-12-22 DIAGNOSIS — I7143 Infrarenal abdominal aortic aneurysm, without rupture: Secondary | ICD-10-CM

## 2022-12-23 ENCOUNTER — Inpatient Hospital Stay: Payer: Medicare HMO

## 2022-12-24 ENCOUNTER — Other Ambulatory Visit: Payer: Medicare HMO

## 2022-12-24 ENCOUNTER — Other Ambulatory Visit (HOSPITAL_COMMUNITY): Payer: Medicare HMO

## 2022-12-29 ENCOUNTER — Inpatient Hospital Stay: Payer: Medicare HMO | Attending: Hematology

## 2022-12-29 ENCOUNTER — Ambulatory Visit (INDEPENDENT_AMBULATORY_CARE_PROVIDER_SITE_OTHER): Payer: Medicare HMO

## 2022-12-29 DIAGNOSIS — Z79899 Other long term (current) drug therapy: Secondary | ICD-10-CM | POA: Insufficient documentation

## 2022-12-29 DIAGNOSIS — C3411 Malignant neoplasm of upper lobe, right bronchus or lung: Secondary | ICD-10-CM | POA: Diagnosis not present

## 2022-12-29 DIAGNOSIS — Z809 Family history of malignant neoplasm, unspecified: Secondary | ICD-10-CM | POA: Diagnosis not present

## 2022-12-29 DIAGNOSIS — F17210 Nicotine dependence, cigarettes, uncomplicated: Secondary | ICD-10-CM | POA: Diagnosis not present

## 2022-12-29 DIAGNOSIS — J4550 Severe persistent asthma, uncomplicated: Secondary | ICD-10-CM

## 2022-12-29 DIAGNOSIS — C3491 Malignant neoplasm of unspecified part of right bronchus or lung: Secondary | ICD-10-CM

## 2022-12-29 LAB — CBC WITH DIFFERENTIAL/PLATELET
Abs Immature Granulocytes: 0.04 10*3/uL (ref 0.00–0.07)
Basophils Absolute: 0.1 10*3/uL (ref 0.0–0.1)
Basophils Relative: 1 %
Eosinophils Absolute: 0.2 10*3/uL (ref 0.0–0.5)
Eosinophils Relative: 1 %
HCT: 46.8 % (ref 39.0–52.0)
Hemoglobin: 16.1 g/dL (ref 13.0–17.0)
Immature Granulocytes: 0 %
Lymphocytes Relative: 21 %
Lymphs Abs: 2.6 10*3/uL (ref 0.7–4.0)
MCH: 32.5 pg (ref 26.0–34.0)
MCHC: 34.4 g/dL (ref 30.0–36.0)
MCV: 94.5 fL (ref 80.0–100.0)
Monocytes Absolute: 1.1 10*3/uL — ABNORMAL HIGH (ref 0.1–1.0)
Monocytes Relative: 9 %
Neutro Abs: 8.4 10*3/uL — ABNORMAL HIGH (ref 1.7–7.7)
Neutrophils Relative %: 68 %
Platelets: 176 10*3/uL (ref 150–400)
RBC: 4.95 MIL/uL (ref 4.22–5.81)
RDW: 13.9 % (ref 11.5–15.5)
WBC: 12.4 10*3/uL — ABNORMAL HIGH (ref 4.0–10.5)
nRBC: 0 % (ref 0.0–0.2)

## 2022-12-29 LAB — COMPREHENSIVE METABOLIC PANEL
ALT: 18 U/L (ref 0–44)
AST: 16 U/L (ref 15–41)
Albumin: 4 g/dL (ref 3.5–5.0)
Alkaline Phosphatase: 112 U/L (ref 38–126)
Anion gap: 10 (ref 5–15)
BUN: 21 mg/dL (ref 8–23)
CO2: 24 mmol/L (ref 22–32)
Calcium: 9.2 mg/dL (ref 8.9–10.3)
Chloride: 98 mmol/L (ref 98–111)
Creatinine, Ser: 1.12 mg/dL (ref 0.61–1.24)
GFR, Estimated: >60 mL/min (ref >60)
Glucose, Bld: 359 mg/dL — ABNORMAL HIGH (ref 70–99)
Potassium: 3.8 mmol/L (ref 3.5–5.1)
Sodium: 132 mmol/L — ABNORMAL LOW (ref 135–145)
Total Bilirubin: 0.6 mg/dL (ref 0.3–1.2)
Total Protein: 7.3 g/dL (ref 6.5–8.1)

## 2022-12-30 NOTE — Telephone Encounter (Signed)
PAP still pending will continue Xolair for now

## 2022-12-31 NOTE — Progress Notes (Signed)
He is seeing Dr. Ellin Saba today

## 2022-12-31 NOTE — Progress Notes (Signed)
[facility] [address] [place] [address]    Clinic Day:  01/01/23  Referring physician: Assunta Found, MD  Patient Care Team: Elfredia Nevins, MD as PCP - General (Internal Medicine) Wyline Mood Dorothe Pea, MD as PCP - Cardiology (Cardiology) Alfonse Spruce, MD as Consulting Physician (Allergy and Immunology) Corbin Ade, MD as Consulting Physician (Gastroenterology) Kennon Rounds as Physician Assistant (Cardiology)   ASSESSMENT & PLAN:   Assessment: 1.  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 -He was felt not to be a surgical candidate. -SBRT to the right lung lesion from 05/05/2018 through 05/12/2018, 54 Gray in 3 fractions of 18 Wallace Cullens. -He is continuing to smoke 2 packs of cigarettes per day. -CT chest on 04/20/2020 shows unchanged posttreatment appearance of irregular nodule of the posterior right upper lobe measuring 2.1 x 0.8 cm.  Stable small pulmonary nodules.  Unchanged prominent pretracheal lymph node measuring 2.4 x 0.9 cm.  Plan: 1.  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 - Recent bronchitis treated with Augmentin, prednisone.  Continue Trelegy. - Reviewed CT chest without contrast from 06/26/2022 which showed stable bandlike posttreatment changes along the posterior medial right upper lobe with no new masses seen.  No evidence of recurrence. - Reviewed labs from 06/26/2022 which showed normal LFTs creatinine and CBC. - Recommend follow-up in 6 months with repeat CT scan and labs.  No orders of the defined types were placed in this encounter.     Alben Deeds Frank,acting as a Neurosurgeon for Doreatha Massed, MD.,have documented all relevant documentation on the behalf of Doreatha Massed, MD,as directed by  Doreatha Massed,  MD while in the presence of Doreatha Massed, MD.   ***  Kevin Frank   7/10/202410:54 PM  CHIEF COMPLAINT:   Diagnosis: squamous cell carcinoma of the right lung  Cancer Staging  Squamous cell carcinoma of right lung [facility]) Staging form: Lung, AJCC 8th Edition - Clinical stage from 03/29/2018: Stage IA3 (cT1c, cN0, cM0) - Signed by Doreatha Massed, MD on 03/29/2018    Prior Therapy: SBRT 54 Gy in 3 fractions to right lung from 05/05/2018 to 05/12/2018   NGS Results: not done   Current Therapy:  surveillance    HISTORY OF PRESENT ILLNESS:   Oncology History  Squamous cell carcinoma of right lung (HCC)  03/29/2018 Initial Diagnosis   Squamous cell carcinoma of right lung (HCC)   03/29/2018 Cancer Staging   Staging form: Lung, AJCC 8th Edition - Clinical stage from 03/29/2018: Stage IA3 (cT1c, cN0, cM0) - Signed by Doreatha Massed, MD on 03/29/2018      INTERVAL HISTORY:   Kevin Frank is a 82 y.o. male presenting to clinic today for follow up of squamous cell carcinoma of the right lung. He was last seen by me on 07/03/22.  Today, he states that he is doing well overall. His appetite level is at ***%. His energy level is at ***%.  PAST MEDICAL HISTORY:   Past Medical History: Past Medical History:  Diagnosis Date   AAA (abdominal aortic aneurysm) (HCC) 01/2008   3.2cm   Allergy    Arthritis    Asthma    BPH (benign prostatic hyperplasia)    Chronic knee pain    bilateral   COPD (chronic obstructive  pulmonary disease) (HCC)    Diabetes mellitus    type II   Gout    History of kidney stones    Hypercholesterolemia    Hyperlipidemia    Hypertension    Lung cancer (HCC) 2019   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    Normal nuclear stress test 2011   Obesity    Obstructive sleep apnea on CPAP    Presence of indwelling urinary catheter    Shortness of breath    due to cigarette abuse   Tobacco abuse     Surgical History: Past  Surgical History:  Procedure Laterality Date   ABDOMINAL AORTIC ENDOVASCULAR FENESTRATED STENT GRAFT N/A 04/09/2016   Procedure: ABDOMINAL AORTIC ENDOVASCULAR FENESTRATED STENT GRAFT with left brachial artery access using ultrasound guidance;  Surgeon: Nada Libman, MD;  Location: [facility] OR;  Service: Vascular;  Laterality: N/A;   CHOLECYSTECTOMY  1997   [facility]   COLONOSCOPY  2006   2006: multiple diminutive polyps in rectum, one pedunculated polyp at 10 cm s/p snare removal. Left-sided diverticulum, pedunculated polyps at 35 cm and splenic flexure s/p snare removal. No path available.    COLONOSCOPY WITH PROPOFOL N/A 11/24/2019   Procedure: COLONOSCOPY WITH PROPOFOL;  Surgeon: Corbin Ade, MD;  Location: [facility] ENDO SUITE;  Service: Endoscopy;  Laterality: N/A;  9:45am   PERIPHERAL VASCULAR CATHETERIZATION N/A 04/10/2016   Procedure: Renal Angiography;  Surgeon: Chuck Hint, MD;  Location: [facility] INVASIVE CV LAB;  Service: Cardiovascular;  Laterality: N/A;   POLYPECTOMY  11/24/2019   Procedure: POLYPECTOMY;  Surgeon: Corbin Ade, MD;  Location: [facility] ENDO SUITE;  Service: Endoscopy;;   TRANSURETHRAL RESECTION OF PROSTATE  01/07/2011   Procedure: TRANSURETHRAL RESECTION OF THE PROSTATE (TURP);  Surgeon: Ky Barban;  Location: [facility] ORS;  Service: Urology;  Laterality: N/A;    Social History: Social History   Socioeconomic History   Marital status: Married    Spouse name: Okey Regal   Number of children: 2   Years of education: Not on file   Highest education level: Not on file  Occupational History   Not on file  Tobacco Use   Smoking status: Every Day    Packs/day: 2.00    Years: 50.00    Additional pack years: 0.00    Total pack years: 100.00    Types: Cigarettes    Passive exposure: Never   Smokeless tobacco: Never   Tobacco comments:    Smokes 2 packs a day [facility] 10/17/2021   Vaping Use   Vaping Use: Never used  Substance and Sexual Activity   Alcohol use: Yes     Alcohol/week: 0.0 standard drinks of alcohol    Comment: very little   Drug use: No   Sexual activity: Not on file  Other Topics Concern   Not on file  Social History Narrative   Lives at home with wife   Social Determinants of Health   Financial Resource Strain: Low Risk  (03/05/2022)   Overall Financial Resource Strain (CARDIA)    Difficulty of Paying Living Expenses: Not hard at all  Food Insecurity: Not on file  Transportation Needs: No Transportation Needs (03/05/2022)   PRAPARE - Administrator, Civil Service (Medical): No    Lack of Transportation (Non-Medical): No  Physical Activity: Not on file  Stress: Not on file  Social Connections: Not on file  Intimate Partner Violence: Not on file    Family History: Family  History  Problem Relation Age of Onset   Liver disease Father    Lymphoma Mother    Diabetes Mother    Hypertension Mother    Cancer Mother    Mental retardation Other        sibling -- from birth    Asthma Daughter    Allergic rhinitis Neg Hx    Angioedema Neg Hx    Atopy Neg Hx    Eczema Neg Hx    Immunodeficiency Neg Hx    Colon cancer Neg Hx     Current Medications:  Current Outpatient Medications:    albuterol (PROVENTIL) (2.5 MG/3ML) 0.083% nebulizer solution, Take 3 mLs (2.5 mg total) by nebulization every 6 (six) hours as needed for wheezing or shortness of breath., Disp: 150 mL, Rfl: 2   albuterol (VENTOLIN HFA) 108 (90 Base) MCG/ACT inhaler, Use 4 puffs every 4-6 hours as needed for cough or wheeze, Disp: 18 g, Rfl: 2   allopurinol (ZYLOPRIM) 300 MG tablet, Take 1 tablet (300 mg total) by mouth daily., Disp: 30 tablet, Rfl: 1   amLODipine (NORVASC) 10 MG tablet, Take 1 tablet (10 mg total) by mouth daily., Disp: 14 tablet, Rfl: 0   atorvastatin (LIPITOR) 20 MG tablet, Take 1 tablet (20 mg total) by mouth daily., Disp: 90 tablet, Rfl: 1   clopidogrel (PLAVIX) 75 MG tablet, Take 1 tablet (75 mg total) by mouth daily., Disp: 30  tablet, Rfl: 1   Cobalamin Combinations (NEURIVA PLUS) CAPS, Take 1 tablet by mouth daily., Disp: , Rfl:    EPINEPHrine 0.3 mg/0.3 mL IJ SOAJ injection, Inject 0.3 mg into the muscle as needed for anaphylaxis., Disp: 1 each, Rfl: 1   Fluticasone-Umeclidin-Vilant (TRELEGY ELLIPTA) 100-62.5-25 MCG/ACT AEPB, inhale 1 puff into the lungs daily in the afternoon., Disp: 60 each, Rfl: 0   hydrALAZINE (APRESOLINE) 50 MG tablet, Take 1 tablet (50 mg total) by mouth 3 (three) times daily., Disp: 270 tablet, Rfl: 3   JARDIANCE 10 MG TABS tablet, Take 10 mg by mouth daily., Disp: , Rfl:    losartan (COZAAR) 100 MG tablet, TAKE 1 TABLET BY MOUTH ONCE DAILY., Disp: 30 tablet, Rfl: 0   losartan (COZAAR) 50 MG tablet, , Disp: , Rfl:    metFORMIN (GLUCOPHAGE) 500 MG tablet, Take 2 tablets (1,000 mg total) by mouth 2 (two) times daily with a meal., Disp: 120 tablet, Rfl: 0   metoprolol succinate (TOPROL-XL) 25 MG 24 hr tablet, Take 1 tablet (25 mg total) by mouth daily., Disp: 14 tablet, Rfl: 0   omalizumab (XOLAIR) 150 MG/ML prefilled syringe, Inject 375 mg into the skin every 14 (fourteen) days. Dispense 4 150mg  syringes and 2 75mg  syringes for 28 days supply (Patient not taking: Reported on 11/21/2022), Disp: 5 mL, Rfl: 11   sertraline (ZOLOFT) 50 MG tablet, Take 1 tablet (50 mg total) by mouth daily., Disp: 30 tablet, Rfl: 1   torsemide (DEMADEX) 20 MG tablet, Take 1 tablet (20 mg total) by mouth daily., Disp: , Rfl:   Current Facility-Administered Medications:    methylPREDNISolone acetate (DEPO-MEDROL) injection 40 mg, 40 mg, Intra-articular, Once, Vickki Hearing, MD   omalizumab Geoffry Paradise) prefilled syringe 375 mg, 375 mg, Subcutaneous, Q14 Days, Alfonse Spruce, MD, 375 mg at 12/29/22 1147   tezepelumab-ekko (TEZSPIRE) 210 MG/1. syringe 210 mg, 210 mg, Subcutaneous, Q28 days, Alfonse Spruce, MD, 210 mg at 11/26/22 1110   Allergies: Allergies  Allergen Reactions   No Known Allergies  REVIEW OF SYSTEMS:   Review of Systems  Constitutional:  Negative for chills, fatigue and fever.  HENT:   Negative for lump/mass, mouth sores, nosebleeds, sore throat and trouble swallowing.   Eyes:  Negative for eye problems.  Respiratory:  Negative for cough and shortness of breath.   Cardiovascular:  Negative for chest pain, leg swelling and palpitations.  Gastrointestinal:  Negative for abdominal pain, constipation, diarrhea, nausea and vomiting.  Genitourinary:  Negative for bladder incontinence, difficulty urinating, dysuria, frequency, hematuria and nocturia.   Musculoskeletal:  Negative for arthralgias, back pain, flank pain, myalgias and neck pain.  Skin:  Negative for itching and rash.  Neurological:  Negative for dizziness, headaches and numbness.  Hematological:  Does not bruise/bleed easily.  Psychiatric/Behavioral:  Negative for depression, sleep disturbance and suicidal ideas. The patient is not nervous/anxious.   All other systems reviewed and are negative.    VITALS:   There were no vitals taken for this visit.  Wt Readings from Last 3 Encounters:  11/21/22 227 lb 6.4 oz (103.1 kg)  10/31/22 225 lb 8 oz (102.3 kg)  10/20/22 226 lb 3.2 oz (102.6 kg)    There is no height or weight on file to calculate BMI.  Performance status (ECOG): {CHL ONC [id number]  PHYSICAL EXAM:   Physical Exam Vitals and nursing note reviewed. Exam conducted with a chaperone present.  Constitutional:      Appearance: Normal appearance.  Cardiovascular:     Rate and Rhythm: Normal rate and regular rhythm.     Pulses: Normal pulses.     Heart sounds: Normal heart sounds.  Pulmonary:     Effort: Pulmonary effort is normal.     Breath sounds: Normal breath sounds.  Abdominal:     Palpations: Abdomen is soft. There is no hepatomegaly, splenomegaly or mass.     Tenderness: There is no abdominal tenderness.  Musculoskeletal:     Right lower leg: No edema.     Left lower leg:  No edema.  Lymphadenopathy:     Cervical: No cervical adenopathy.     Right cervical: No superficial, deep or posterior cervical adenopathy.    Left cervical: No superficial, deep or posterior cervical adenopathy.     Upper Body:     Right upper body: No supraclavicular or axillary adenopathy.     Left upper body: No supraclavicular or axillary adenopathy.  Neurological:     General: No focal deficit present.     Mental Status: He is alert and oriented to person, place, and time.  Psychiatric:        Mood and Affect: Mood normal.        Behavior: Behavior normal.     LABS:      Latest Ref Rng & Units 12/29/2022   10:59 AM 06/26/2022    1:46 PM 12/17/2021    1:13 PM  CBC  WBC 4.0 - 10.5 K/uL 12.4  6.2  10.5   Hemoglobin 13.0 - 17.0 g/dL 16.1  09.6  04.5   Hematocrit 39.0 - 52.0 % 46.8  43.0  48.6   Platelets 150 - 400 K/uL 176  142  171       Latest Ref Rng & Units 12/29/2022   10:59 AM 11/04/2022   12:13 PM 06/26/2022    1:46 PM  CMP  Glucose 70 - 99 mg/dL 409   811   BUN 8 - 23 mg/dL 21   12   Creatinine 9.14 - 1.24 mg/dL 7.82  1.10  0.95   Sodium 135 - 145 mmol/L 132   138   Potassium 3.5 - 5.1 mmol/L 3.8   3.5   Chloride 98 - 111 mmol/L 98   104   CO2 22 - 32 mmol/L 24   25   Calcium 8.9 - 10.3 mg/dL 9.2   8.6   Total Protein 6.5 - 8.1 g/dL 7.3   6.8   Total Bilirubin 0.3 - 1.2 mg/dL 0.6   0.8   Alkaline Phos 38 - 126 U/L 112   81   AST 15 - 41 U/L 16   19   ALT 0 - 44 U/L 18   17      No results found for: "CEA1", "CEA" / No results found for: "CEA1", "CEA" No results found for: "PSA1" No results found for: "ZOX096" No results found for: "CAN125"  No results found for: "TOTALPROTELP", "ALBUMINELP", "A1GS", "A2GS", "BETS", "BETA2SER", "GAMS", "MSPIKE", "SPEI" No results found for: "TIBC", "FERRITIN", "IRONPCTSAT" Lab Results  Component Value Date   LDH 106 04/26/2019   LDH 110 11/17/2018   LDH 111 07/27/2018     STUDIES:   VAS [place] EVAR DUPLEX  Result  Date: 12/15/2022 ABDOMINAL AORTA STUDY Patient Name:  Kevin Frank  Date of Exam:   12/15/2022 Medical Rec #: [id number]         Accession #:    [id number] Date of Birth: [birth year]         Patient Gender: M Patient Age:   14 years Exam Location:  [facility] Procedure:      VAS [place] EVAR DUPLEX Referring Phys: Coral Else --------------------------------------------------------------------------------  Indications: Follow up exam for EVAR. Risk Factors: Hypertension, hyperlipidemia. 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 Angioplasty and stenting of the left                         renal artery. Limitations: Air/bowel gas, obesity and Breathing artifact, coughing.  Performing Technologist: Elita Quick RVT  Examination Guidelines: A complete evaluation includes B-mode imaging, spectral Doppler, color Doppler, and power Doppler as needed of all accessible portions of each vessel. Bilateral testing is considered an integral part of a complete examination. Limited examinations for reoccurring indications may be performed as noted.  Endovascular Aortic Repair (EVAR): +----------+----------------+-------------------+--------+           Diameter AP (cm)Velocities (cm/sec)Comments +----------+----------------+-------------------+--------+ Aorta     4.00            165                         +----------+----------------+-------------------+--------+ Right Limb                                   NV       +----------+----------------+-------------------+--------+ Left Limb                                    NV       +----------+----------------+-------------------+--------+ +-------------+---------------+  Endoleak TypeNone visualized +-------------+---------------+  Summary: Abdominal Aorta: The largest aortic diameter remains  essentially unchanged compared to prior exam. Previous diameter measurement was 3.9 cm obtained on 10/07/2021. NOTE: This was a suboptimal, limited exam. Further imaging modality may be warranted  *See table(s) above for measurements and observations.  Electronically signed by Coral Else MD on 12/15/2022 at 11:18:05 AM.    Final    VAS US RENAL ARTERY DUPLEX  Result Date: 12/15/2022 ABDOMINAL VISCERAL Patient Name:  Kevin Frank  Date of Exam:   12/15/2022 Medical Rec #: [id number]         Accession #:    [id number] Date of Birth: [birth year]         Patient Gender: M Patient Age:   2 years Exam Location:  [facility] Procedure:      VAS US RENAL ARTERY DUPLEX Referring Phys: [id number] Nada Libman -------------------------------------------------------------------------------- Limitations: Air/bowel gas, obesity and Breathing artifact, excessive cough, emphysema Performing Technologist: Elita Quick RVT  Examination Guidelines: A complete evaluation includes B-mode imaging, spectral Doppler, color Doppler, and power Doppler as needed of all accessible portions of each vessel. Bilateral testing is considered an integral part of a complete examination. Limited examinations for reoccurring indications may be performed as noted.  Duplex Findings: +------------------+--------+--------+-------+ Right Renal ArteryPSV cm/sEDV cm/sComment +------------------+--------+--------+-------+ Origin                              NV    +------------------+--------+--------+-------+ Proximal            150      20           +------------------+--------+--------+-------+ Mid                 108      15           +------------------+--------+--------+-------+ Distal              100      26           +------------------+--------+--------+-------+ +-----------------+--------+--------+-------+ Left Renal ArteryPSV cm/sEDV cm/sComment +-----------------+--------+--------+-------+ Proximal            106      25           +-----------------+--------+--------+-------+ Mid                112      23           +-----------------+--------+--------+-------+ Distal              76      16           +-----------------+--------+--------+-------+ +------------+--------+--------+--+-----------+--------+--------+---+ Right KidneyPSV cm/sEDV cm/sRILeft KidneyPSV cm/sEDV cm/sRI  +------------+--------+--------+--+-----------+--------+--------+---+ Upper Pole                    Upper Pole                     +------------+--------+--------+--+-----------+--------+--------+---+ Mid                           Mid                            +------------+--------+--------+--+-----------+--------+--------+---+ Lower [employer]                     +------------+--------+--------+--+-----------+--------+--------+---+  Hilar                         Hilar                          +------------+--------+--------+--+-----------+--------+--------+---+ +------------------+-------+------------------+-------+ Right Kidney             Left Kidney               +------------------+-------+------------------+-------+ RAR                      RAR                       +------------------+-------+------------------+-------+ RAR (manual)             RAR (manual)              +------------------+-------+------------------+-------+ Cortex                   Cortex                    +------------------+-------+------------------+-------+ Cortex thickness  1.39 mmCorex thickness   1.60 mm +------------------+-------+------------------+-------+ Kidney length (cm)12.45  Kidney length (cm)11.00   +------------------+-------+------------------+-------+  Summary: Renal:  Right: Normal size right kidney. RRV flow present. Left:  Normal size of left kidney. LRV flow present.         NOTE:        The renal arteries appear to be patent,  limited        visualization, unable to visualize stents, however an        increase in velocity was noted in what appears to be the area        of the left stent in the 50 - 99% stenosis area. Further        imaging modality may be warranted.  *See table(s) above for measurements and observations.  Diagnosing physician: Coral Else MD  Electronically signed by Coral Else MD on 12/15/2022 at 11:17:34 AM.    Final

## 2023-01-01 ENCOUNTER — Inpatient Hospital Stay: Payer: Medicare HMO | Admitting: Hematology

## 2023-01-01 ENCOUNTER — Encounter: Payer: Self-pay | Admitting: Hematology

## 2023-01-01 VITALS — BP 157/76 | HR 60 | Temp 97.5°F | Resp 18 | Wt 227.8 lb

## 2023-01-01 DIAGNOSIS — C3491 Malignant neoplasm of unspecified part of right bronchus or lung: Secondary | ICD-10-CM

## 2023-01-01 DIAGNOSIS — Z79899 Other long term (current) drug therapy: Secondary | ICD-10-CM | POA: Diagnosis not present

## 2023-01-01 DIAGNOSIS — C3411 Malignant neoplasm of upper lobe, right bronchus or lung: Secondary | ICD-10-CM | POA: Diagnosis not present

## 2023-01-01 DIAGNOSIS — F17210 Nicotine dependence, cigarettes, uncomplicated: Secondary | ICD-10-CM | POA: Diagnosis not present

## 2023-01-01 DIAGNOSIS — Z809 Family history of malignant neoplasm, unspecified: Secondary | ICD-10-CM | POA: Diagnosis not present

## 2023-01-01 NOTE — Patient Instructions (Addendum)
[facility]  Discharge Instructions  You were seen and examined today by Dr. Ellin Saba.  Dr. Ellin Saba discussed your most recent lab work and CT scan which revealed that everything looks good and stable except your blood sugar is 359 this is high.  Start back taking Metformin 2 tablets daily. Dr. Ellin Saba will repeat CT of chest and labs before your next appointment.  Follow-up as scheduled in 6 months.    Thank you for choosing [facility] to provide your oncology and hematology care.   To afford each patient quality time with our provider, please arrive at least 15 minutes before your scheduled appointment time. You may need to reschedule your appointment if you arrive late (10 or more minutes). Arriving late affects you and other patients whose appointments are after yours.  Also, if you miss three or more appointments without notifying the office, you may be dismissed from the clinic at the provider's discretion.    Again, thank you for choosing [facility].  Our hope is that these requests will decrease the amount of time that you wait before being seen by our physicians.   If you have a lab appointment with the Cancer Center - please note that after April 8th, all labs will be drawn in the cancer center.  You do not have to check in or register with the main entrance as you have in the past but will complete your check-in at the cancer center.            _____________________________________________________________  Should you have questions after your visit to [facility], please contact our office at [phone] and follow the prompts.  Our office hours are 8:00 a.m. to 4:30 p.m. Monday - Thursday and 8:00 a.m. to 2:30 p.m. Friday.  Please note that voicemails left after 4:00 p.m. may not be returned until the following business day.  We are closed weekends and all major holidays.  You do have  access to a nurse 24-7, just call the main number to the clinic [phone] and do not press any options, hold on the line and a nurse will answer the phone.    For prescription refill requests, have your pharmacy contact our office and allow 72 hours.    Masks are no longer required in the cancer centers. If you would like for your care team to wear a mask while they are taking care of you, please let them know. You may have one support person who is at least 82 years old accompany you for your appointments.

## 2023-01-09 ENCOUNTER — Other Ambulatory Visit: Payer: Self-pay | Admitting: Allergy & Immunology

## 2023-01-14 ENCOUNTER — Ambulatory Visit (INDEPENDENT_AMBULATORY_CARE_PROVIDER_SITE_OTHER): Payer: Medicare HMO

## 2023-01-14 DIAGNOSIS — J4550 Severe persistent asthma, uncomplicated: Secondary | ICD-10-CM | POA: Diagnosis not present

## 2023-01-16 ENCOUNTER — Other Ambulatory Visit: Payer: Self-pay | Admitting: Allergy & Immunology

## 2023-01-16 NOTE — Telephone Encounter (Signed)
Patients wife called for an update. I spoke with Tammy and she states she is going to have to mail out new PAP for the Tezspire as this has been changed and will need to re-do the forms. Patient will continue to do Xolair for now.

## 2023-01-27 DIAGNOSIS — E6609 Other obesity due to excess calories: Secondary | ICD-10-CM | POA: Diagnosis not present

## 2023-01-27 DIAGNOSIS — R400 Somnolence: Secondary | ICD-10-CM | POA: Diagnosis not present

## 2023-01-27 DIAGNOSIS — R5383 Other fatigue: Secondary | ICD-10-CM | POA: Diagnosis not present

## 2023-01-27 DIAGNOSIS — Z6832 Body mass index (BMI) 32.0-32.9, adult: Secondary | ICD-10-CM | POA: Diagnosis not present

## 2023-01-27 DIAGNOSIS — I5032 Chronic diastolic (congestive) heart failure: Secondary | ICD-10-CM | POA: Diagnosis not present

## 2023-01-27 DIAGNOSIS — Z0001 Encounter for general adult medical examination with abnormal findings: Secondary | ICD-10-CM | POA: Diagnosis not present

## 2023-01-27 DIAGNOSIS — E559 Vitamin D deficiency, unspecified: Secondary | ICD-10-CM | POA: Diagnosis not present

## 2023-01-27 DIAGNOSIS — E1159 Type 2 diabetes mellitus with other circulatory complications: Secondary | ICD-10-CM | POA: Diagnosis not present

## 2023-01-27 DIAGNOSIS — E1165 Type 2 diabetes mellitus with hyperglycemia: Secondary | ICD-10-CM | POA: Diagnosis not present

## 2023-01-27 DIAGNOSIS — J449 Chronic obstructive pulmonary disease, unspecified: Secondary | ICD-10-CM | POA: Diagnosis not present

## 2023-01-27 DIAGNOSIS — G4733 Obstructive sleep apnea (adult) (pediatric): Secondary | ICD-10-CM | POA: Diagnosis not present

## 2023-01-28 ENCOUNTER — Ambulatory Visit (INDEPENDENT_AMBULATORY_CARE_PROVIDER_SITE_OTHER): Payer: Medicare HMO

## 2023-01-28 DIAGNOSIS — J4540 Moderate persistent asthma, uncomplicated: Secondary | ICD-10-CM

## 2023-01-28 NOTE — Telephone Encounter (Signed)
Patient and his wife asked about the forms for the patients Tezspire. Have you sent this out Tam Tam?  Thanks

## 2023-01-28 NOTE — Telephone Encounter (Signed)
Yes mailed out last week should be getting them any day

## 2023-01-30 NOTE — Telephone Encounter (Signed)
Wife has been updated 

## 2023-02-06 ENCOUNTER — Ambulatory Visit: Payer: Medicare HMO | Admitting: Allergy & Immunology

## 2023-02-09 ENCOUNTER — Ambulatory Visit: Payer: Medicare HMO

## 2023-02-11 ENCOUNTER — Ambulatory Visit (INDEPENDENT_AMBULATORY_CARE_PROVIDER_SITE_OTHER): Payer: Medicare HMO

## 2023-02-11 DIAGNOSIS — J4550 Severe persistent asthma, uncomplicated: Secondary | ICD-10-CM | POA: Diagnosis not present

## 2023-02-17 ENCOUNTER — Telehealth: Payer: Self-pay | Admitting: *Deleted

## 2023-02-17 NOTE — Telephone Encounter (Signed)
Called patient and advised approval for Tezspire from [employer] and needs to call number to order same and bring into office with next appt. Given directions on storage anddelivery for same

## 2023-02-18 NOTE — Telephone Encounter (Signed)
Great - thanks, Tam Tam!   Malachi Bonds, MD [facility]

## 2023-02-27 ENCOUNTER — Ambulatory Visit (INDEPENDENT_AMBULATORY_CARE_PROVIDER_SITE_OTHER): Payer: Medicare HMO

## 2023-02-27 DIAGNOSIS — J4550 Severe persistent asthma, uncomplicated: Secondary | ICD-10-CM

## 2023-03-23 ENCOUNTER — Ambulatory Visit: Payer: Medicare HMO | Admitting: Pulmonary Disease

## 2023-03-23 ENCOUNTER — Encounter: Payer: Self-pay | Admitting: Pulmonary Disease

## 2023-03-23 VITALS — BP 191/81 | HR 81 | Ht 71.0 in | Wt 229.0 lb

## 2023-03-23 DIAGNOSIS — G4733 Obstructive sleep apnea (adult) (pediatric): Secondary | ICD-10-CM

## 2023-03-23 DIAGNOSIS — J9611 Chronic respiratory failure with hypoxia: Secondary | ICD-10-CM

## 2023-03-23 DIAGNOSIS — J4489 Other specified chronic obstructive pulmonary disease: Secondary | ICD-10-CM | POA: Diagnosis not present

## 2023-03-23 NOTE — Progress Notes (Signed)
[facility]  Chief Complaint  Patient presents with   Follow-up    Past Surgical History:  He  has a past surgical history that includes Cholecystectomy (1997); Transurethral resection of prostate (01/07/2011); Cardiac catheterization (N/A, 04/10/2016); Abdominal aortic endovascular fenestrated stent graft (N/A, 04/09/2016); Colonoscopy (2006); Colonoscopy with propofol (N/A, 11/24/2019); and polypectomy (11/24/2019).  Past Medical History:  AAA, BPH, Osteoarthritis, DM type 2, Gout, Nephrolithiasis, HLD, HTN, Lung cancer 2019  Constitutional:  BP (!) 191/81   Pulse 81   Ht 5\' 11"  (1.803 m)   Wt 229 lb (103.9 kg)   SpO2 90%   BMI 31.94 kg/m   Brief Summary:  Kevin Frank is a 82 y.o. male smoker with obstructive sleep apnea, COPD with asthma, and history of lung cancer.      Subjective:   He just got shipment of tezspire.  Hasn't started this yet.  Still smoking 2 packs per day.  Has cough and chest congestion.  Bringing up clear sputum.  Uses CPAP and oxygen at night.  Using trelegy daily.  Uses albuterol at least daily.   Physical Exam:   Appearance - well kempt   ENMT - no sinus tenderness, no oral exudate, no LAN, Mallampati 3 airway, no stridor, poor hearing acuity  Respiratory - bilateral rhonchi that clears with coughing  CV - s1s2 regular rate and rhythm, no murmurs  Ext - no clubbing, no edema  Skin - no rashes  Psych - normal mood and affect     Pulmonary testing:  PFT 04/07/18 >> FEV1 2.25 (75%), FEV1% 52, TLC 8.47 (120%), RV 4.27 (164%), DLCO 47%, +BD Spirometry 11/03/22 >> FEV1 1.28 (45.39%), FEV1% 52  Chest Imaging:  CT chest 08/18/21 >> 2 x 1 cm linear density RUL w/o change, centrilobular emphysema CT chest 11/08/22 >> atherosclerosis, mod emphysema with bronchial thickening, RUL XRT changes, stable nodules  Sleep Tests:  PSG 03/24/07 >> AHI 53, SpO2 low 74% Auto CPAP 10/22/22 to 11/20/22 >> used on 30  of 30 nights with average 11 hrs.  Average AHI 0.5 with median CPAP 9 and 95 th percentile CPAP 12 cm H2O   Cardiac Tests:  Echo 11/15/19 >> EF 60 to 65%, grade 1 DD, mild LVH, aortic root 41 mm  Social History:  He  reports that he has been smoking cigarettes. He has a 100 pack-year smoking history. He has never been exposed to tobacco smoke. He has never used smokeless tobacco. He reports current alcohol use. He reports that he does not use drugs.  Family History:  His family history includes Asthma in his daughter; Cancer in his mother; Diabetes in his mother; Hypertension in his mother; Liver disease in his father; Lymphoma in his mother; Mental retardation in an other family member.     Assessment/Plan:   Obstructive sleep apnea. - he reports compliance with therapy and benefit from CPAP - he uses [employer] for his DME - current CPAP ordered on 10/17/21 - continue auto CPAP 5 to 15 cm H2O  COPD with asthma. - he is followed by Dr. Malachi Bonds with asthma/allergy >> he will be switching from xolair to tezspire - reviewed his inhaler regimen - continue trelegy 100 one puff daily - prn albuterol  Nocturnal hypoxemia. - continue oxygen at night with CPAP  Tobacco abuse. 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 Ellin Saba with oncology - most recent CT chest is stable  Time Spent Involved in Patient Care on Day of Examination:  27 minutes  Follow up:   Patient Instructions  Follow up in 6 months  Medication List:   Allergies as of 03/23/2023       Reactions   No Known Allergies         Medication List        Accurate as of March 23, 2023  9:56 AM. If you have any questions, ask your nurse or doctor.          albuterol 108 (90 Base) MCG/ACT inhaler Commonly known as: VENTOLIN HFA Use 4 puffs every 4-6 hours as needed for cough or  wheeze   albuterol (2.5 MG/3ML) 0.083% nebulizer solution Commonly known as: PROVENTIL Take 3 mLs (2.5 mg total) by nebulization every 6 (six) hours as needed for wheezing or shortness of breath.   allopurinol 300 MG tablet Commonly known as: ZYLOPRIM Take 1 tablet (300 mg total) by mouth daily.   amLODipine 10 MG tablet Commonly known as: NORVASC Take 1 tablet (10 mg total) by mouth daily.   atorvastatin 20 MG tablet Commonly known as: LIPITOR Take 1 tablet (20 mg total) by mouth daily.   clopidogrel 75 MG tablet Commonly known as: PLAVIX Take 1 tablet (75 mg total) by mouth daily.   EPINEPHrine 0.3 mg/0.3 mL Soaj injection Commonly known as: EPI-PEN Inject 0.3 mg into the muscle as needed for anaphylaxis.   hydrALAZINE 50 MG tablet Commonly known as: APRESOLINE Take 1 tablet (50 mg total) by mouth 3 (three) times daily.   Jardiance 10 MG Tabs tablet Generic drug: empagliflozin Take 10 mg by mouth daily.   losartan 50 MG tablet Commonly known as: COZAAR   losartan 100 MG tablet Commonly known as: COZAAR TAKE 1 TABLET BY MOUTH ONCE DAILY.   metoprolol succinate 25 MG 24 hr tablet Commonly known as: TOPROL-XL Take 1 tablet (25 mg total) by mouth daily.   Neuriva Plus Caps Take 1 tablet by mouth daily.   omalizumab 150 MG/ML prefilled syringe Commonly known as: Xolair Inject 375 mg into the skin every 14 (fourteen) days. Dispense 4 150mg  syringes and 2 75mg  syringes for 28 days supply   sertraline 50 MG tablet Commonly known as: ZOLOFT Take 1 tablet (50 mg total) by mouth daily.   torsemide 20 MG tablet Commonly known as: DEMADEX Take 1 tablet (20 mg total) by mouth daily.   Trelegy Ellipta 100-62.5-25 MCG/ACT Aepb Generic drug: Fluticasone-Umeclidin-Vilant INHALE 1 PUFF EVERY DAY        Signature:  Coralyn Helling, MD [facility] Pager - [phone] 03/23/2023, 9:56 AM

## 2023-03-23 NOTE — Patient Instructions (Signed)
Follow up in 6 months 

## 2023-03-30 ENCOUNTER — Ambulatory Visit: Payer: Medicare HMO

## 2023-03-30 DIAGNOSIS — J4550 Severe persistent asthma, uncomplicated: Secondary | ICD-10-CM

## 2023-03-31 ENCOUNTER — Telehealth: Payer: Self-pay | Admitting: Allergy & Immunology

## 2023-03-31 NOTE — Telephone Encounter (Signed)
Lm for pt to call us back when he can

## 2023-03-31 NOTE — Telephone Encounter (Signed)
Kevin Frank's daughter called my phone last night to discuss some reactions he was having, presumably to his shot. Can someone call and make sure that he is doing ok? I did not see the call since I am not on call this week.  Malachi Bonds, MD [facility]

## 2023-04-07 NOTE — Telephone Encounter (Signed)
Dosent appear like he ever did

## 2023-04-07 NOTE — Telephone Encounter (Signed)
Did he ever call back?

## 2023-04-15 ENCOUNTER — Ambulatory Visit: Payer: Medicare HMO | Admitting: Allergy & Immunology

## 2023-04-15 NOTE — Telephone Encounter (Signed)
I called today. He missed his appointment today.

## 2023-04-21 ENCOUNTER — Ambulatory Visit: Payer: Medicare HMO | Admitting: Cardiology

## 2023-04-21 NOTE — Progress Notes (Deleted)
Clinical Summary Kevin Frank is a 82 y.o.male  Chronic HFpEF -10/2019 echo: LVEF 60-65%, grade I dd, mild pulm HTN  2. HTN   3. HLDL   4. Coronary atherosclerosis - coronary calcifications noted on prior CT scan -10/2019 nuclear stress: no ischemia   5. DM2   6. COPD wti ashtha - followed by pulmonary  7. History of lung CA - followed by oncology   8. AAA s/p repair - followed by vascular  9. OSA on cpap 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    Normal nuclear stress test 2011   Obesity    Obstructive sleep apnea on CPAP    Presence of indwelling urinary catheter    Shortness of breath    due to cigarette abuse   Tobacco abuse      Allergies  Allergen Reactions   No Known Allergies      Current Outpatient Medications  Medication Sig Dispense Refill   albuterol (PROVENTIL) (2.5 MG/3ML) 0.083% nebulizer solution Take 3 mLs (2.5 mg total) by nebulization every 6 (six) hours as needed for wheezing or shortness of breath. 150 mL 2   albuterol (VENTOLIN HFA) 108 (90 Base) MCG/ACT inhaler Use 4 puffs every 4-6 hours as needed for cough or wheeze 18 g 2   allopurinol (ZYLOPRIM) 300 MG tablet Take 1 tablet (300 mg total) by mouth daily. 30 tablet 1   amLODipine (NORVASC) 10 MG tablet Take 1 tablet (10 mg total) by mouth daily. 14 tablet 0   atorvastatin (LIPITOR) 20 MG tablet Take 1 tablet (20 mg total) by mouth daily. 90 tablet 1   clopidogrel (PLAVIX) 75 MG tablet Take 1 tablet (75 mg total) by mouth daily. 30 tablet 1    Cobalamin Combinations (NEURIVA PLUS) CAPS Take 1 tablet by mouth daily.     EPINEPHrine 0.3 mg/0.3 mL IJ SOAJ injection Inject 0.3 mg into the muscle as needed for anaphylaxis. 1 each 1   hydrALAZINE (APRESOLINE) 50 MG tablet Take 1 tablet (50 mg total) by mouth 3 (three) times daily. 270 tablet 3   JARDIANCE 10 MG TABS tablet Take 10 mg by mouth daily.     losartan (COZAAR) 100 MG tablet TAKE 1 TABLET BY MOUTH ONCE DAILY. 30 tablet 0   losartan (COZAAR) 50 MG tablet      metoprolol succinate (TOPROL-XL) 25 MG 24 hr tablet Take 1 tablet (25 mg total) by mouth daily. 14 tablet 0   omalizumab (XOLAIR) 150 MG/ML prefilled syringe Inject 375 mg into the skin every 14 (fourteen) days. Dispense 4 150mg  syringes and 2 75mg  syringes for 28 days supply 5 mL 11   sertraline (ZOLOFT) 50 MG tablet Take 1 tablet (50 mg total) by mouth daily. 30 tablet 1   torsemide (DEMADEX) 20 MG tablet Take 1 tablet (20 mg total) by mouth daily.     TRELEGY ELLIPTA 100-62.5-25 MCG/ACT AEPB INHALE 1  PUFF EVERY DAY 180 each 3   Current Facility-Administered Medications  Medication Dose Route Frequency Provider Last Rate Last Admin   methylPREDNISolone acetate (DEPO-MEDROL) injection 40 mg  40 mg Intra-articular Once Vickki Hearing, MD       omalizumab Geoffry Paradise) prefilled syringe 375 mg  375 mg Subcutaneous Q14 Days Alfonse Spruce, MD   375 mg at 02/11/23 0853   tezepelumab-ekko (TEZSPIRE) 210 MG/1. syringe 210 mg  210 mg Subcutaneous Q28 days Alfonse Spruce, MD   210 mg at 03/30/23 5284     Past Surgical History:  Procedure Laterality Date   ABDOMINAL AORTIC ENDOVASCULAR FENESTRATED STENT GRAFT N/A 04/09/2016   Procedure: ABDOMINAL AORTIC ENDOVASCULAR FENESTRATED STENT GRAFT with left brachial artery access using ultrasound guidance;  Surgeon: Nada Libman, MD;  Location: [facility] OR;  Service: Vascular;  Laterality: N/A;   CHOLECYSTECTOMY  1997   [facility]   COLONOSCOPY  2006   2006: multiple diminutive  polyps in rectum, one pedunculated polyp at 10 cm s/p snare removal. Left-sided diverticulum, pedunculated polyps at 35 cm and splenic flexure s/p snare removal. No path available.    COLONOSCOPY WITH PROPOFOL N/A 11/24/2019   Procedure: COLONOSCOPY WITH PROPOFOL;  Surgeon: Corbin Ade, MD;  Location: [facility];  Service: Endoscopy;  Laterality: N/A;  9:45am   PERIPHERAL VASCULAR CATHETERIZATION N/A 04/10/2016   Procedure: Renal Angiography;  Surgeon: Chuck Hint, MD;  Location: [facility] INVASIVE CV LAB;  Service: Cardiovascular;  Laterality: N/A;   POLYPECTOMY  11/24/2019   Procedure: POLYPECTOMY;  Surgeon: Corbin Ade, MD;  Location: [facility];  Service: Endoscopy;;   TRANSURETHRAL RESECTION OF PROSTATE  01/07/2011   Procedure: TRANSURETHRAL RESECTION OF THE PROSTATE (TURP);  Surgeon: Ky Barban;  Location: [facility] ORS;  Service: Urology;  Laterality: N/A;     Allergies  Allergen Reactions   No Known Allergies       Family History  Problem Relation Age of Onset   Liver disease Father    Lymphoma Mother    Diabetes Mother    Hypertension Mother    Cancer Mother    Mental retardation Other        sibling -- from birth    Asthma Daughter    Allergic rhinitis Neg Hx    Angioedema Neg Hx    Atopy Neg Hx    Eczema Neg Hx    Immunodeficiency Neg Hx    Colon cancer Neg Hx      Social History Kevin Frank reports that he has been smoking cigarettes. He has a 100 pack-year smoking history. He has never been exposed to tobacco smoke. He has never used smokeless tobacco. Kevin Frank reports current alcohol use.   Review of Systems CONSTITUTIONAL: No weight loss, fever, chills, weakness or fatigue.  HEENT: Eyes: No visual loss, blurred vision, double vision or yellow sclerae.No hearing loss, sneezing, congestion, runny nose or sore throat.  SKIN: No rash or itching.  CARDIOVASCULAR:  RESPIRATORY: No shortness of breath, cough or sputum.  GASTROINTESTINAL: No  anorexia, nausea, vomiting or diarrhea. No abdominal pain or blood.  GENITOURINARY: No burning on urination, no polyuria NEUROLOGICAL: No headache, dizziness, syncope, paralysis, ataxia, numbness or tingling in the extremities. No change in bowel or bladder control.  MUSCULOSKELETAL: No muscle, back pain, joint pain or stiffness.  LYMPHATICS: No enlarged nodes. No history of splenectomy.  PSYCHIATRIC: No history of depression or anxiety.  ENDOCRINOLOGIC: No reports of sweating, cold or heat intolerance. No  polyuria or polydipsia.  [name]   Physical Examination There were no vitals filed for this visit. There were no vitals filed for this visit.  Gen: resting comfortably, no acute distress HEENT: no scleral icterus, pupils equal round and reactive, no palptable cervical adenopathy,  CV Resp: Clear to auscultation bilaterally GI: abdomen is soft, non-tender, non-distended, normal bowel sounds, no hepatosplenomegaly MSK: extremities are warm, no edema.  Skin: warm, no rash Neuro:  no focal deficits Psych: appropriate affect   Diagnostic Studies     Assessment and Plan        Antoine Poche, M.D., F.A.C.C.

## 2023-04-22 ENCOUNTER — Encounter: Payer: Self-pay | Admitting: Cardiology

## 2023-04-22 ENCOUNTER — Ambulatory Visit: Payer: Medicare HMO | Attending: Cardiology | Admitting: Cardiology

## 2023-04-22 VITALS — BP 180/85 | HR 72 | Ht 70.0 in | Wt 231.0 lb

## 2023-04-22 DIAGNOSIS — E782 Mixed hyperlipidemia: Secondary | ICD-10-CM

## 2023-04-22 DIAGNOSIS — I5032 Chronic diastolic (congestive) heart failure: Secondary | ICD-10-CM | POA: Diagnosis not present

## 2023-04-22 DIAGNOSIS — I10 Essential (primary) hypertension: Secondary | ICD-10-CM

## 2023-04-22 MED ORDER — ATORVASTATIN CALCIUM 40 MG PO TABS: 40.0000 mg | ORAL_TABLET | Freq: Every day | ORAL | 2 refills | Status: DC

## 2023-04-22 MED ORDER — HYDRALAZINE HCL 100 MG PO TABS: 100.0000 mg | ORAL_TABLET | Freq: Two times a day (BID) | ORAL | 2 refills | Status: DC

## 2023-04-22 NOTE — Progress Notes (Signed)
Clinical Summary Kevin Frank is a 82 y.o.male last seen by NP Kevin Frank, this is our first visit together. Seen for the following medical problems.   Chronic HFpEF -10/2019 echo: LVEF 60-65%, grade I dd, mild pulm HTN - no recent edema. He denies SOB/DOE - taking torsemide 20mg  daily. Not checking home weights    2. HTN - compliant with meds - troubles takind hydralazine tid     3. HLD - Jan 2024 TC 149 TG 136 HDL 50 LDL 75     4. Coronary atherosclerosis - coronary calcifications noted on prior CT scan -10/2019 nuclear stress: no ischemia   - no chest pains.    5. DM2     6. 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 History of lung CA - followed by oncology     8. AAA s/p repair - followed by vascular   9. OSA on cpap - compliant with cpap 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    Normal nuclear stress test 2011   Obesity    Obstructive sleep apnea on CPAP    Presence of indwelling urinary catheter    Shortness of breath    due to cigarette abuse   Tobacco abuse      Allergies  Allergen Reactions   No Known Allergies      Current Outpatient Medications  Medication Sig Dispense Refill   albuterol (PROVENTIL) (2.5 MG/3ML) 0.083% nebulizer solution Take 3 mLs (2.5 mg total) by nebulization every 6 (six) hours as needed for wheezing or shortness of breath. 150 mL 2   albuterol (VENTOLIN HFA) 108 (90 Base) MCG/ACT inhaler Use 4 puffs every 4-6 hours as needed for cough or wheeze 18 g 2   allopurinol (ZYLOPRIM) 300 MG  tablet Take 1 tablet (300 mg total) by mouth daily. 30 tablet 1   amLODipine (NORVASC) 10 MG tablet Take 1 tablet (10 mg total) by mouth daily. 14 tablet 0   atorvastatin (LIPITOR) 20 MG tablet Take 1 tablet (20 mg total) by mouth daily. 90 tablet 1   clopidogrel (PLAVIX) 75 MG tablet Take 1 tablet (75 mg total) by mouth daily. 30 tablet 1   Cobalamin Combinations (NEURIVA PLUS) CAPS Take 1 tablet by mouth daily.     EPINEPHrine 0.3 mg/0.3 mL IJ SOAJ injection Inject 0.3 mg into the muscle as needed for anaphylaxis. 1 each 1   hydrALAZINE (APRESOLINE) 50 MG tablet Take 1 tablet (50 mg total) by mouth 3 (three) times daily. 270 tablet 3   JARDIANCE 10 MG TABS tablet Take 10 mg by mouth daily.     losartan (COZAAR) 100 MG tablet TAKE 1 TABLET BY MOUTH ONCE DAILY. 30 tablet 0   losartan (COZAAR) 50 MG tablet      metFORMIN (GLUCOPHAGE) 500 MG tablet Take 1,000 mg by mouth 2 (two) times daily.     metoprolol succinate (TOPROL-XL) 25  MG 24 hr tablet Take 1 tablet (25 mg total) by mouth daily. 14 tablet 0   omalizumab (XOLAIR) 150 MG/ML prefilled syringe Inject 375 mg into the skin every 14 (fourteen) days. Dispense 4 150mg  syringes and 2 75mg  syringes for 28 days supply 5 mL 11   sertraline (ZOLOFT) 50 MG tablet Take 1 tablet (50 mg total) by mouth daily. 30 tablet 1   torsemide (DEMADEX) 20 MG tablet Take 1 tablet (20 mg total) by mouth daily.     TRELEGY ELLIPTA 100-62.5-25 MCG/ACT AEPB INHALE 1 PUFF EVERY DAY 180 each 3   Current Facility-Administered Medications  Medication Dose Route Frequency Provider Last Rate Last Admin   methylPREDNISolone acetate (DEPO-MEDROL) injection 40 mg  40 mg Intra-articular Once Vickki Hearing, MD       omalizumab Geoffry Paradise) prefilled syringe 375 mg  375 mg Subcutaneous Q14 Days Alfonse Spruce, MD   375 mg at 02/11/23 0853   tezepelumab-ekko (TEZSPIRE) 210 MG/1. syringe 210 mg  210 mg Subcutaneous Q28 days Alfonse Spruce, MD   210 mg at 03/30/23  6644     Past Surgical History:  Procedure Laterality Date   ABDOMINAL AORTIC ENDOVASCULAR FENESTRATED STENT GRAFT N/A 04/09/2016   Procedure: ABDOMINAL AORTIC ENDOVASCULAR FENESTRATED STENT GRAFT with left brachial artery access using ultrasound guidance;  Surgeon: Nada Libman, MD;  Location: [facility] OR;  Service: Vascular;  Laterality: N/A;   CHOLECYSTECTOMY  1997   [facility]   COLONOSCOPY  2006   2006: multiple diminutive polyps in rectum, one pedunculated polyp at 10 cm s/p snare removal. Left-sided diverticulum, pedunculated polyps at 35 cm and splenic flexure s/p snare removal. No path available.    COLONOSCOPY WITH PROPOFOL N/A 11/24/2019   Procedure: COLONOSCOPY WITH PROPOFOL;  Surgeon: Corbin Ade, MD;  Location[facility];  Service: Endoscopy;  Laterality: N/A;  9:45am   PERIPHERAL VASCULAR CATHETERIZATION N/A 04/10/2016   Procedure: Renal Angiography;  Surgeon: Chuck Hint, MD;  Location: [facility] INVASIVE CV LAB;  Service: Cardiovascular;  Laterality: N/A;   POLYPECTOMY  11/24/2019   Procedure: POLYPECTOMY;  Surgeon: Corbin Ade, MD;  Location: [facility];  Service: Endoscopy;;   TRANSURETHRAL RESECTION OF PROSTATE  01/07/2011   Procedure: TRANSURETHRAL RESECTION OF THE PROSTATE (TURP);  Surgeon: Ky Barban;  Location: [facility] ORS;  Service: Urology;  Laterality: N/A;     Allergies  Allergen Reactions   No Known Allergies       Family History  Problem Relation Age of Onset   Liver disease Father    Lymphoma Mother    Diabetes Mother    Hypertension Mother    Cancer Mother    Mental retardation Other        sibling -- from birth    Asthma Daughter    Allergic rhinitis Neg Hx    Angioedema Neg Hx    Atopy Neg Hx    Eczema Neg Hx    Immunodeficiency Neg Hx    Colon cancer Neg Hx      Social History Kevin Frank reports that he has been smoking cigarettes. He has a 100 pack-year smoking history. He has never been exposed to tobacco smoke. He has  never used smokeless tobacco. Kevin Frank reports current alcohol use.   Review of Systems CONSTITUTIONAL: No weight loss, fever, chills, weakness or fatigue.  HEENT: Eyes: No visual loss, blurred vision, double vision or yellow sclerae.No hearing loss, sneezing, congestion, runny nose or sore throat.  SKIN: No rash or itching.  CARDIOVASCULAR: per hpi RESPIRATORY: No shortness of breath, cough or sputum.  GASTROINTESTINAL: No anorexia, nausea, vomiting or diarrhea. No abdominal pain or blood.  GENITOURINARY: No burning on urination, no polyuria NEUROLOGICAL: No headache, dizziness, syncope, paralysis, ataxia, numbness or tingling in the extremities. No change in bowel or bladder control.  MUSCULOSKELETAL: No muscle, back pain, joint pain or stiffness.  LYMPHATICS: No enlarged nodes. No history of splenectomy.  PSYCHIATRIC: No history of depression or anxiety.  ENDOCRINOLOGIC: No reports of sweating, cold or heat intolerance. No polyuria or polydipsia.  [name]   Physical Examination Vitals:   04/22/23 1305 04/22/23 1327  BP: (!) 180/100 (!) 180/85  Pulse:    SpO2:     Filed Weights   04/22/23 1256  Weight: 231 lb (104.8 kg)    Gen: resting comfortably, no acute distress HEENT: no scleral icterus, pupils equal round and reactive, no palptable cervical adenopathy,  CV: RRR, no m/rg, no jvd Resp: Clear to auscultation bilaterally GI: abdomen is soft, non-tender, non-distended, normal bowel sounds, no hepatosplenomegaly MSK: extremities are warm, no edema.  Skin: warm, no rash Neuro:  no focal deficits Psych: appropriate affect   Diagnostic Studies  10/2019 echo 1. Normal LV systolic function; mild LVE; mild LVH; grade 1 diastolic  dysfunction; mildly dilated ascending aorta.   2. Left ventricular ejection fraction, by estimation, is 60 to 65%. The  left ventricle has normal function. The left ventricle has no regional  wall motion abnormalities. The left ventricular internal  cavity size was  mildly dilated. There is mild left  ventricular hypertrophy. Left ventricular diastolic parameters are  consistent with Grade I diastolic dysfunction (impaired relaxation).  Elevated left atrial pressure.   3. Right ventricular systolic function is normal. The right ventricular  size is normal. There is mildly elevated pulmonary artery systolic  pressure.   4. The mitral valve is normal in structure. Trivial mitral valve  regurgitation. No evidence of mitral stenosis.   5. The aortic valve is tricuspid. Aortic valve regurgitation is not  visualized. Mild aortic valve sclerosis is present, with no evidence of  aortic valve stenosis.   6. Aortic dilatation noted. There is mild dilatation of the ascending  aorta measuring 41 mm.   7. The inferior vena cava is normal in size with greater than 50%  respiratory variability, suggesting right atrial pressure of 3 mmHg.   10/2019 nuclear stress Nuclear stress EF: 60%. The left ventricular ejection fraction is normal (55-65%). This is a low risk study. There is no evidence of ischemia and no evidence of previous myocardial infarction. The study is normal.    Assessment and Plan  Chronic HFpEF -euvolemic today without symptoms, continue current meds  2. HTN - above goal - difficultly taking his hydral 50mg  tid, will change to 100mg  bid  3. HLD - above goal LDL, increase atorvastatin to 40mg  daily        Antoine Poche, M.D.

## 2023-04-22 NOTE — Patient Instructions (Addendum)
Medication Instructions:  Your physician has recommended you make the following change in your medication:  Increase Atorvastatin 40 daily Hydralazine 100 mg twice daily Continue taking all other medications as prescribed   Labwork: None  Testing/Procedures: None  Follow-Up: Your physician recommends that you schedule a follow-up appointment in: 2 week Nurse Visit, 6 months  Any Other Special Instructions Will Be Listed Below (If Applicable). Thank you for choosing [facility]!      If you need a refill on your cardiac medications before your next appointment, please call your pharmacy.

## 2023-04-27 ENCOUNTER — Ambulatory Visit: Payer: Medicare HMO

## 2023-04-27 DIAGNOSIS — J4550 Severe persistent asthma, uncomplicated: Secondary | ICD-10-CM

## 2023-05-01 ENCOUNTER — Ambulatory Visit: Payer: Medicare HMO | Admitting: Allergy & Immunology

## 2023-05-01 ENCOUNTER — Encounter: Payer: Self-pay | Admitting: Allergy & Immunology

## 2023-05-01 ENCOUNTER — Other Ambulatory Visit: Payer: Self-pay

## 2023-05-01 VITALS — BP 150/74 | HR 89 | Temp 98.8°F | Resp 24 | Ht 70.0 in | Wt 228.8 lb

## 2023-05-01 DIAGNOSIS — J4550 Severe persistent asthma, uncomplicated: Secondary | ICD-10-CM | POA: Diagnosis not present

## 2023-05-01 DIAGNOSIS — J302 Other seasonal allergic rhinitis: Secondary | ICD-10-CM | POA: Diagnosis not present

## 2023-05-01 DIAGNOSIS — R918 Other nonspecific abnormal finding of lung field: Secondary | ICD-10-CM

## 2023-05-01 DIAGNOSIS — J3089 Other allergic rhinitis: Secondary | ICD-10-CM | POA: Diagnosis not present

## 2023-05-01 DIAGNOSIS — R5383 Other fatigue: Secondary | ICD-10-CM | POA: Diagnosis not present

## 2023-05-01 MED ORDER — TRELEGY ELLIPTA 100-62.5-25 MCG/ACT IN AEPB: 1.0000 | INHALATION_SPRAY | Freq: Every day | RESPIRATORY_TRACT | 3 refills | Status: AC

## 2023-05-01 MED ORDER — ALBUTEROL SULFATE HFA 108 (90 BASE) MCG/ACT IN AERS: INHALATION_SPRAY | RESPIRATORY_TRACT | 2 refills | Status: DC

## 2023-05-01 NOTE — Patient Instructions (Addendum)
1. Asthma/COPD overlap syndrome - Lung testing looks much better than last time!  - We are going to not make any changes today.  - Continue with your Trelegy one puff once daily + Tezspire every 28 days  - May use albuterol 2 puffs every 4 hours as needed for cough, wheeze, tightness in chest, shortness of breath OR may use albuterol via nebulizer 1 ampoule every 4 hours as needed for cough, wheeze, tightness in chest, or shortness of breath. Asthma control goals:  Full participation in all desired activities (may need albuterol before activity) Albuterol use two time or less a week on average (not counting use with activity) Cough interfering with sleep two time or less a month Oral steroids no more than once a year No hospitalizations  2. Seasonal and perennial allergic rhinitis - May use an antihistamine such as Claritin, Allegra, Xyzal, or Zyrtec once a day as needed for runny nose  3. Return in about 6 months (around 10/29/2023).      Please inform us of any Emergency Department visits, hospitalizations, or changes in symptoms. Call us before going to the ED for breathing or allergy symptoms since we might be able to fit you in for a sick visit. Feel free to contact us anytime with any questions, problems, or concerns.  It was a pleasure to see you guys again today! We have our calendars marked for the next stew!   Websites that have reliable patient information: 1. American Academy of Asthma, Allergy, and Immunology: www.aaaai.org 2. Food Allergy Research and Education (FARE): foodallergy.org 3. Mothers of Asthmatics: http://www.asthmacommunitynetwork.org 4. American College of Allergy, Asthma, and Immunology: www.acaai.org      "Like" [place] on Facebook and Instagram for our latest updates!      A healthy democracy works best when [employer] participate! Make sure you are registered to vote! If you have moved or changed any of your contact information, you will need to get this  updated before voting! Scan the QR codes below to learn more!

## 2023-05-01 NOTE — Progress Notes (Signed)
FOLLOW UP  Date of Service/Encounter:  05/01/23   Assessment:   Asthma-COPD overlap syndrome with acute exacerbation   Seasonal and perennial allergic rhinitis (cat, dog, cockroach, trees, weeds, and ragweed)   Fatigue - normal vitamin B12 levels   Current smoker   Complex medical history, including lung cancer (detected early, treated with radiation and resection alone)   Restrictive pattern of spirometry  Hypertension - improved with increased dosing of furosemide  Plan/Recommendations:   1. Asthma/COPD overlap syndrome - Lung testing looks much better than last time!  - We are going to not make any changes today.  - Continue with your Trelegy one puff once daily + Tezspire every 28 days  - May use albuterol 2 puffs every 4 hours as needed for cough, wheeze, tightness in chest, shortness of breath OR may use albuterol via nebulizer 1 ampoule every 4 hours as needed for cough, wheeze, tightness in chest, or shortness of breath. Asthma control goals:  Full participation in all desired activities (may need albuterol before activity) Albuterol use two time or less a week on average (not counting use with activity) Cough interfering with sleep two time or less a month Oral steroids no more than once a year No hospitalizations  2. Seasonal and perennial allergic rhinitis - May use an antihistamine such as Claritin, Allegra, Xyzal, or Zyrtec once a day as needed for runny nose  3.  Muscle weakness as well as fatigue - I recommended that they talk to his PCP about these issues. - He is using his CPAP, although compliance is not entirely certain. - I am worried about his muscle weakness, which is why we wanted a neurology referral. - However, his wife is not interested in going to [facility] for an evaluation by neurology. - We we will attempt to find someone local, but the neurologist we used to use in [place] has retired.  4. Return in about 6 months (around 10/29/2023).    Subjective:   Kevin Frank is a 82 y.o. 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   Polyarthralgia 07/14/2022   Coronary artery calcification 12/09/2021   Asthma exacerbation in COPD (HCC) 08/18/2021   Cough 07/18/2021   Asthma with COPD with exacerbation (HCC) 07/18/2021   Lobar pneumonia (HCC) 12/11/2020   Acute respiratory failure (HCC) 12/10/2020   Wrist pain, acute, right 09/07/2019   Right forearm pain 09/07/2019   Uncontrolled type 2 diabetes mellitus with hyperglycemia (HCC) 06/27/2019   Mixed hyperlipidemia 06/27/2019   Class 1 obesity due to excess calories with serious comorbidity and body mass index (BMI) of 32.0 to 32.9 in adult 06/27/2019   Diabetes mellitus without complication (HCC) 06/22/2019   Positive colorectal cancer screening using Cologuard test 06/14/2019   Squamous cell carcinoma of right lung (HCC) 03/29/2018   Seasonal and perennial allergic rhinitis 09/22/2017   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   Obstructive sleep apnea on CPAP 04/23/2017   Chronic knee pain 04/23/2017   AKI (acute kidney injury) (HCC) 04/23/2017   Chronic heart failure with preserved ejection fraction (HCC) 04/23/2017   Cellulitis 04/23/2017   Cellulitis of left lower leg    Current smoker 03/05/2016   Incidental lung nodule, > 3mm  and < 8mm 03/05/2016   Stage 2 moderate COPD by GOLD classification (HCC) 03/05/2016   AAA (abdominal aortic aneurysm) without rupture (HCC) 06/14/2015   Tobacco use 06/09/2007   OBSTRUCTIVE SLEEP APNEA 06/09/2007   HYPERCHOLESTEROLEMIA 05/06/2007   GOUT 05/06/2007   DEPRESSION 05/06/2007   Essential hypertension 05/06/2007   Aneurysm of abdominal vessel (HCC) 05/06/2007   BENIGN PROSTATIC HYPERTROPHY, HX OF  05/06/2007    History obtained from: chart review and patient and his wife .  Discussed the use of AI scribe software for clinical note transcription with the patient and/or guardian, who gave verbal consent to proceed.  Kevin Frank is a 82 y.o. male presenting for a follow up visit.  He was last seen in May 2024.  At that time, his lung testing looked a bit worse.  He did have a chest CT that we arranged to take place shortly thereafter.  We gave him a Depo-Medrol injection and recommended that he start albuterol mixed with budesonide twice daily.  We did talk to Tammy to work on getting him change from Xolair to [employer].  We continue with his Trelegy 1 puff once daily and his oxygen at night.  For his rhinitis, we continue with an over-the-counter antihistamine as needed for runny nose.  Since last visit, he did miss an appointment.  However, he has now received 3 injections of his new asthma biologic.  Asthma/Respiratory Symptom History: Kevin Frank presents with improved breathing test results. He reports joint discomfort and a recent episode of red eye, which has since resolved. He is currently on Trelegy, taken once daily, and has not required any steroids or emergency room visits since the last consultation. The patient also reports persistent fatigue, which has not improved.  They are really unclear whether the new injectable has helped much with his breathing, but I did point out that his breathing numbers are better.  In addition, he has not needed prednisone or been to the emergency room for his breathing since we changed from Xolair.  He does remain on the Trelegy 1 puff once daily.  The patient has been using a CPAP machine and oxygen every night, but reports significant sleepiness and weakness. He is not currently active and spends most of his time sleeping and smoking.  His wife has brought up the fatigue on multiple other occasions, and we did get some limited lab work which were all normal.  I  did recommend that she talk to his PCP about it.  She tells me that she tried to make an appointment but there was not one available until December, so she just gave up.  The patient has been receiving Tezspire, with the first injection administered in September. He reports some discomfort and difficulty with mobility, particularly getting in and out of cars and sitting down. Despite these issues, his breathing test numbers have improved.  The symptoms were not occurring after his first injection, but instead occurred in the last 2 to 4 weeks after he was out late with the boys.  Chest CT (May 2024):  IMPRESSION: 1. 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 No evidence of local tumor recurrence. 2. No evidence of metastatic disease in the chest. 3. New mild bandlike consolidation with associated volume loss in the posterior lingula compatible with mild scarring or atelectasis. 4. 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 Recommend annual imaging  Allergic Rhinitis Symptom History: He does use a regular antihistamine to help with the postnasal drip.  He  has not been on antibiotics recently.  He denies any sinus infections.  The patient has been experiencing high blood pressure, with a recent reading of 180/100 at the doctor's office. He denies increased urination since starting blood pressure medication. He also reports needing a new rescue inhaler.  His wife is concerned about heart failure which was mentioned as a diagnosis when he went to see his cardiologist.  I did review the echocardiogram and the FEV1 is normal.  Recommended that they talk to his cardiologist about these problems.  Otherwise, there have been no changes to his past medical history, surgical history, family history, or social history.    Review of systems otherwise negative other than that mentioned in the HPI.    Objective:   Blood pressure (!) 150/74, pulse 89, temperature 98.8 F (37.1 C),  resp. rate (!) 24, height 5\' 10"  (1.778 m), weight 228 lb 12.8 oz (103.8 kg), SpO2 90%. Body mass index is 32.83 kg/m.    Physical Exam Vitals reviewed.  Constitutional:      Appearance: He is well-developed.     Comments: Less interactive.  Still very deaf.   HENT:     Head: Normocephalic and atraumatic.     Right Ear: Tympanic membrane, ear canal and external ear normal.     Left Ear: Tympanic membrane, ear canal and external ear normal.     Nose: No nasal deformity, septal deviation, mucosal edema or rhinorrhea.     Right Turbinates: Enlarged, swollen and pale.     Left Turbinates: Enlarged, swollen and pale.     Right Sinus: No maxillary sinus tenderness or frontal sinus tenderness.     Left Sinus: No maxillary sinus tenderness or frontal sinus tenderness.     Comments: Dried rhinorrhea.  No epistaxis.     Mouth/Throat:     Lips: Pink.     Mouth: Mucous membranes are not pale and dry.     Pharynx: Uvula midline.     Comments: Mild cobblestoning. Eyes:     General: Lids are normal. No allergic shiner.       Right eye: No discharge.        Left eye: No discharge.     Conjunctiva/sclera: Conjunctivae normal.     Right eye: Right conjunctiva is not injected. No chemosis.    Left eye: Left conjunctiva is not injected. No chemosis.    Pupils: Pupils are equal, round, and reactive to light.  Cardiovascular:     Rate and Rhythm: Normal rate and regular rhythm.     Heart sounds: Normal heart sounds.  Pulmonary:     Effort: Pulmonary effort is normal. No tachypnea, accessory muscle usage or respiratory distress.     Breath sounds: Examination of the right-upper field reveals rhonchi. Examination of the left-upper field reveals rhonchi. Examination of the right-middle field reveals rhonchi. Examination of the left-middle field reveals rhonchi. Examination of the right-lower field reveals wheezing and rhonchi. Examination of the left-lower field reveals wheezing and rhonchi. Wheezing  and rhonchi present. No rales.     Comments: Coarse rhonchi throughout.  There is some wheezing noted at the bases.  Chest:     Chest wall: No tenderness.  Lymphadenopathy:     Cervical: No cervical adenopathy.  Skin:    General: Skin is warm.     Capillary Refill: Capillary refill takes less than 2 seconds.     Coloration: Skin is not pale.     Findings: No abrasion, erythema, petechiae or  rash. Rash is not papular, urticarial or vesicular.  Neurological:     Mental Status: He is alert.  Psychiatric:        Behavior: Behavior is cooperative.      Diagnostic studies:    Spirometry: results abnormal (FEV1: 1.48/53%, FVC: 2.69/70%, FEV1/FVC: 55%).    Spirometry consistent with mixed obstructive and restrictive disease.   Allergy Studies: none        Malachi Bonds, MD  [facility]

## 2023-05-04 ENCOUNTER — Telehealth: Payer: Self-pay | Admitting: Allergy & Immunology

## 2023-05-04 NOTE — Telephone Encounter (Signed)
Patient's wife called stating the pharmacy called her and told her that the Trelegy that was prescribed is going to cost 457 dollars. Patient's wife is wanting to know a cheaper option for Trelegy.

## 2023-05-04 NOTE — Telephone Encounter (Signed)
Please advise on switching to Alpha, BEVESPI , STIOLTO , or UTIBRON as they seems to be covered through his insurance.  Thank you

## 2023-05-05 MED ORDER — FLUTICASONE-SALMETEROL 500-50 MCG/ACT IN AEPB: 1.0000 | INHALATION_SPRAY | Freq: Two times a day (BID) | RESPIRATORY_TRACT | 5 refills | Status: DC

## 2023-05-05 NOTE — Telephone Encounter (Signed)
I sent in Wixela one puff BID.  Malachi Bonds, MD [facility]

## 2023-05-05 NOTE — Addendum Note (Signed)
Addended by: Alfonse Spruce on: 05/05/2023 08:17 AM   Modules accepted: Orders

## 2023-05-05 NOTE — Telephone Encounter (Signed)
Left message explaining the new med and directions and to call if he had any questions

## 2023-05-06 ENCOUNTER — Ambulatory Visit: Payer: Medicare HMO | Attending: Internal Medicine

## 2023-05-19 ENCOUNTER — Ambulatory Visit: Payer: Medicare HMO | Attending: Cardiology | Admitting: *Deleted

## 2023-05-19 ENCOUNTER — Encounter: Payer: Self-pay | Admitting: *Deleted

## 2023-05-19 ENCOUNTER — Telehealth: Payer: Self-pay | Admitting: *Deleted

## 2023-05-19 VITALS — BP 142/72 | HR 74 | Ht 70.0 in | Wt 226.2 lb

## 2023-05-19 DIAGNOSIS — I10 Essential (primary) hypertension: Secondary | ICD-10-CM

## 2023-05-19 NOTE — Progress Notes (Signed)
BP much improved, still a little above goal. Can he see Lang Snow or February to reasses, could consider adding aldactone but would be cautious given advanced age and already on several meds. Would look to see what his bp does over time.   Dominga Ferry MD

## 2023-05-19 NOTE — Telephone Encounter (Addendum)
-----   Message from Deontez, Easterly sent at 05/19/2023  3:05 PM EST ----- BP much improved, still a little above goal. Can he see Lang Snow or February to reasses, could consider adding aldactone but would be cautious given advanced age and already on several meds. Would look to see what his bp does over time.    Dominga Ferry MD

## 2023-05-19 NOTE — Patient Instructions (Signed)
Your physician recommends that you continue on your current medications as directed. Please refer to the Current Medication list given to you today.  We will contact you if any changes are made

## 2023-05-19 NOTE — Progress Notes (Unsigned)
Presents for nurse visit for BP check per last office visit request. Medications reviewed. Reports taking all doses of medications without side effects. Denies chest pain, dizziness or sob. Vitals taken and sent to provider for review.

## 2023-05-20 NOTE — Telephone Encounter (Signed)
Patient informed and verbalized understanding of plan. 

## 2023-05-25 ENCOUNTER — Ambulatory Visit: Payer: Medicare HMO

## 2023-05-26 DIAGNOSIS — J441 Chronic obstructive pulmonary disease with (acute) exacerbation: Secondary | ICD-10-CM | POA: Diagnosis not present

## 2023-05-26 DIAGNOSIS — E669 Obesity, unspecified: Secondary | ICD-10-CM | POA: Diagnosis not present

## 2023-05-26 DIAGNOSIS — Z792 Long term (current) use of antibiotics: Secondary | ICD-10-CM | POA: Diagnosis not present

## 2023-05-26 DIAGNOSIS — I700 Atherosclerosis of aorta: Secondary | ICD-10-CM | POA: Diagnosis not present

## 2023-05-26 DIAGNOSIS — I2510 Atherosclerotic heart disease of native coronary artery without angina pectoris: Secondary | ICD-10-CM | POA: Diagnosis not present

## 2023-05-26 DIAGNOSIS — R1111 Vomiting without nausea: Secondary | ICD-10-CM | POA: Diagnosis not present

## 2023-05-26 DIAGNOSIS — R918 Other nonspecific abnormal finding of lung field: Secondary | ICD-10-CM | POA: Diagnosis not present

## 2023-05-26 DIAGNOSIS — Z7951 Long term (current) use of inhaled steroids: Secondary | ICD-10-CM | POA: Diagnosis not present

## 2023-05-26 DIAGNOSIS — I110 Hypertensive heart disease with heart failure: Secondary | ICD-10-CM | POA: Diagnosis not present

## 2023-05-26 DIAGNOSIS — J9611 Chronic respiratory failure with hypoxia: Secondary | ICD-10-CM | POA: Diagnosis not present

## 2023-05-26 DIAGNOSIS — A419 Sepsis, unspecified organism: Secondary | ICD-10-CM | POA: Diagnosis not present

## 2023-05-26 DIAGNOSIS — E119 Type 2 diabetes mellitus without complications: Secondary | ICD-10-CM | POA: Diagnosis not present

## 2023-05-26 DIAGNOSIS — J189 Pneumonia, unspecified organism: Secondary | ICD-10-CM | POA: Diagnosis not present

## 2023-05-26 DIAGNOSIS — D72829 Elevated white blood cell count, unspecified: Secondary | ICD-10-CM | POA: Diagnosis not present

## 2023-05-26 DIAGNOSIS — I672 Cerebral atherosclerosis: Secondary | ICD-10-CM | POA: Diagnosis not present

## 2023-05-26 DIAGNOSIS — Z79899 Other long term (current) drug therapy: Secondary | ICD-10-CM | POA: Diagnosis not present

## 2023-05-26 DIAGNOSIS — R110 Nausea: Secondary | ICD-10-CM | POA: Diagnosis not present

## 2023-05-26 DIAGNOSIS — R0902 Hypoxemia: Secondary | ICD-10-CM | POA: Diagnosis not present

## 2023-05-26 DIAGNOSIS — I10 Essential (primary) hypertension: Secondary | ICD-10-CM | POA: Diagnosis not present

## 2023-05-26 DIAGNOSIS — I5032 Chronic diastolic (congestive) heart failure: Secondary | ICD-10-CM | POA: Diagnosis not present

## 2023-05-26 DIAGNOSIS — I5030 Unspecified diastolic (congestive) heart failure: Secondary | ICD-10-CM | POA: Diagnosis not present

## 2023-05-26 DIAGNOSIS — R000 Tachycardia, unspecified: Secondary | ICD-10-CM | POA: Diagnosis not present

## 2023-05-26 DIAGNOSIS — I714 Abdominal aortic aneurysm, without rupture, unspecified: Secondary | ICD-10-CM | POA: Diagnosis not present

## 2023-05-26 DIAGNOSIS — R0602 Shortness of breath: Secondary | ICD-10-CM | POA: Diagnosis not present

## 2023-05-26 DIAGNOSIS — J449 Chronic obstructive pulmonary disease, unspecified: Secondary | ICD-10-CM | POA: Diagnosis not present

## 2023-05-26 DIAGNOSIS — R4182 Altered mental status, unspecified: Secondary | ICD-10-CM | POA: Diagnosis not present

## 2023-05-26 DIAGNOSIS — G4733 Obstructive sleep apnea (adult) (pediatric): Secondary | ICD-10-CM | POA: Diagnosis not present

## 2023-05-26 DIAGNOSIS — J440 Chronic obstructive pulmonary disease with acute lower respiratory infection: Secondary | ICD-10-CM | POA: Diagnosis not present

## 2023-05-26 DIAGNOSIS — J9601 Acute respiratory failure with hypoxia: Secondary | ICD-10-CM | POA: Diagnosis not present

## 2023-05-27 DIAGNOSIS — A419 Sepsis, unspecified organism: Secondary | ICD-10-CM | POA: Diagnosis not present

## 2023-05-27 DIAGNOSIS — I2510 Atherosclerotic heart disease of native coronary artery without angina pectoris: Secondary | ICD-10-CM | POA: Diagnosis not present

## 2023-05-27 DIAGNOSIS — Z7951 Long term (current) use of inhaled steroids: Secondary | ICD-10-CM | POA: Diagnosis not present

## 2023-05-27 DIAGNOSIS — Z792 Long term (current) use of antibiotics: Secondary | ICD-10-CM | POA: Diagnosis not present

## 2023-05-27 DIAGNOSIS — R0902 Hypoxemia: Secondary | ICD-10-CM | POA: Diagnosis not present

## 2023-05-27 DIAGNOSIS — E119 Type 2 diabetes mellitus without complications: Secondary | ICD-10-CM | POA: Diagnosis not present

## 2023-05-27 DIAGNOSIS — I5030 Unspecified diastolic (congestive) heart failure: Secondary | ICD-10-CM | POA: Diagnosis not present

## 2023-05-27 DIAGNOSIS — J449 Chronic obstructive pulmonary disease, unspecified: Secondary | ICD-10-CM | POA: Diagnosis not present

## 2023-05-27 DIAGNOSIS — Z79899 Other long term (current) drug therapy: Secondary | ICD-10-CM | POA: Diagnosis not present

## 2023-05-28 DIAGNOSIS — A419 Sepsis, unspecified organism: Secondary | ICD-10-CM | POA: Diagnosis not present

## 2023-05-28 DIAGNOSIS — I2510 Atherosclerotic heart disease of native coronary artery without angina pectoris: Secondary | ICD-10-CM | POA: Diagnosis not present

## 2023-05-28 DIAGNOSIS — D72829 Elevated white blood cell count, unspecified: Secondary | ICD-10-CM | POA: Diagnosis not present

## 2023-05-28 DIAGNOSIS — J189 Pneumonia, unspecified organism: Secondary | ICD-10-CM | POA: Diagnosis not present

## 2023-05-28 DIAGNOSIS — I5030 Unspecified diastolic (congestive) heart failure: Secondary | ICD-10-CM | POA: Diagnosis not present

## 2023-05-28 DIAGNOSIS — G4733 Obstructive sleep apnea (adult) (pediatric): Secondary | ICD-10-CM | POA: Diagnosis not present

## 2023-05-28 DIAGNOSIS — J9611 Chronic respiratory failure with hypoxia: Secondary | ICD-10-CM | POA: Diagnosis not present

## 2023-05-28 DIAGNOSIS — J449 Chronic obstructive pulmonary disease, unspecified: Secondary | ICD-10-CM | POA: Diagnosis not present

## 2023-05-28 DIAGNOSIS — E119 Type 2 diabetes mellitus without complications: Secondary | ICD-10-CM | POA: Diagnosis not present

## 2023-05-29 DIAGNOSIS — I714 Abdominal aortic aneurysm, without rupture, unspecified: Secondary | ICD-10-CM | POA: Diagnosis not present

## 2023-05-29 DIAGNOSIS — I5030 Unspecified diastolic (congestive) heart failure: Secondary | ICD-10-CM | POA: Diagnosis not present

## 2023-05-29 DIAGNOSIS — E119 Type 2 diabetes mellitus without complications: Secondary | ICD-10-CM | POA: Diagnosis not present

## 2023-05-29 DIAGNOSIS — J441 Chronic obstructive pulmonary disease with (acute) exacerbation: Secondary | ICD-10-CM | POA: Diagnosis not present

## 2023-05-29 DIAGNOSIS — I110 Hypertensive heart disease with heart failure: Secondary | ICD-10-CM | POA: Diagnosis not present

## 2023-06-02 ENCOUNTER — Telehealth: Payer: Self-pay | Admitting: Allergy & Immunology

## 2023-06-02 NOTE — Telephone Encounter (Signed)
Patient's daughter called stating he needs a new Nebulizer machine sent to [facility]. Patient's daughter also wants to know if the nebulizer machine will be covered by insurance.

## 2023-06-02 NOTE — Telephone Encounter (Signed)
Called patient - DOB/NO DPR - spoke to spouse, Okey Regal - advised they can pick up a new nebulizer kit @ the [facility] tomorrow, Wednesday, 06/03/23.  Spouse verbalized understanding to all, no questions.  Patient will need to complete a DPR as well.

## 2023-06-03 ENCOUNTER — Ambulatory Visit: Payer: Medicare HMO

## 2023-06-03 DIAGNOSIS — J4550 Severe persistent asthma, uncomplicated: Secondary | ICD-10-CM | POA: Diagnosis not present

## 2023-06-05 ENCOUNTER — Ambulatory Visit: Payer: Medicare HMO

## 2023-06-05 DIAGNOSIS — E1165 Type 2 diabetes mellitus with hyperglycemia: Secondary | ICD-10-CM | POA: Diagnosis not present

## 2023-06-05 DIAGNOSIS — B351 Tinea unguium: Secondary | ICD-10-CM | POA: Diagnosis not present

## 2023-06-05 DIAGNOSIS — J189 Pneumonia, unspecified organism: Secondary | ICD-10-CM | POA: Diagnosis not present

## 2023-06-05 DIAGNOSIS — R400 Somnolence: Secondary | ICD-10-CM | POA: Diagnosis not present

## 2023-06-05 DIAGNOSIS — G4733 Obstructive sleep apnea (adult) (pediatric): Secondary | ICD-10-CM | POA: Diagnosis not present

## 2023-06-05 DIAGNOSIS — I10 Essential (primary) hypertension: Secondary | ICD-10-CM | POA: Diagnosis not present

## 2023-06-05 DIAGNOSIS — B353 Tinea pedis: Secondary | ICD-10-CM | POA: Diagnosis not present

## 2023-06-05 DIAGNOSIS — E1159 Type 2 diabetes mellitus with other circulatory complications: Secondary | ICD-10-CM | POA: Diagnosis not present

## 2023-06-05 DIAGNOSIS — I714 Abdominal aortic aneurysm, without rupture, unspecified: Secondary | ICD-10-CM | POA: Diagnosis not present

## 2023-06-08 ENCOUNTER — Ambulatory Visit: Payer: Medicare HMO

## 2023-06-24 ENCOUNTER — Other Ambulatory Visit: Payer: Self-pay | Admitting: *Deleted

## 2023-06-24 MED ORDER — TEZSPIRE 210 MG/1.91ML ~~LOC~~ SOAJ: 210.0000 mg | SUBCUTANEOUS | 11 refills | Status: DC

## 2023-06-29 ENCOUNTER — Ambulatory Visit: Payer: Medicare HMO

## 2023-07-02 ENCOUNTER — Inpatient Hospital Stay: Payer: Medicare HMO | Attending: Hematology

## 2023-07-02 ENCOUNTER — Ambulatory Visit (HOSPITAL_COMMUNITY)
Admission: RE | Admit: 2023-07-02 | Discharge: 2023-07-02 | Disposition: A | Discharge status: Home / Self Care | Payer: Medicare HMO | Source: Ambulatory Visit | Attending: Hematology | Admitting: Hematology

## 2023-07-02 DIAGNOSIS — C3490 Malignant neoplasm of unspecified part of unspecified bronchus or lung: Secondary | ICD-10-CM | POA: Diagnosis not present

## 2023-07-02 DIAGNOSIS — C3491 Malignant neoplasm of unspecified part of right bronchus or lung: Secondary | ICD-10-CM | POA: Insufficient documentation

## 2023-07-02 DIAGNOSIS — F17210 Nicotine dependence, cigarettes, uncomplicated: Secondary | ICD-10-CM | POA: Insufficient documentation

## 2023-07-02 DIAGNOSIS — J432 Centrilobular emphysema: Secondary | ICD-10-CM | POA: Diagnosis not present

## 2023-07-02 DIAGNOSIS — Z807 Family history of other malignant neoplasms of lymphoid, hematopoietic and related tissues: Secondary | ICD-10-CM | POA: Diagnosis not present

## 2023-07-02 DIAGNOSIS — I700 Atherosclerosis of aorta: Secondary | ICD-10-CM | POA: Diagnosis not present

## 2023-07-02 DIAGNOSIS — Z79899 Other long term (current) drug therapy: Secondary | ICD-10-CM | POA: Diagnosis not present

## 2023-07-02 LAB — CBC WITH DIFFERENTIAL/PLATELET
Abs Immature Granulocytes: 0.03 10*3/uL (ref 0.00–0.07)
Basophils Absolute: 0 10*3/uL (ref 0.0–0.1)
Basophils Relative: 1 %
Eosinophils Absolute: 0.1 10*3/uL (ref 0.0–0.5)
Eosinophils Relative: 1 %
HCT: 50.1 % (ref 39.0–52.0)
Hemoglobin: 16.2 g/dL (ref 13.0–17.0)
Immature Granulocytes: 1 %
Lymphocytes Relative: 36 %
Lymphs Abs: 2.2 10*3/uL (ref 0.7–4.0)
MCH: 30.2 pg (ref 26.0–34.0)
MCHC: 32.3 g/dL (ref 30.0–36.0)
MCV: 93.5 fL (ref 80.0–100.0)
Monocytes Absolute: 0.8 10*3/uL (ref 0.1–1.0)
Monocytes Relative: 13 %
Neutro Abs: 3 10*3/uL (ref 1.7–7.7)
Neutrophils Relative %: 48 %
Platelets: 150 10*3/uL (ref 150–400)
RBC: 5.36 MIL/uL (ref 4.22–5.81)
RDW: 13.4 % (ref 11.5–15.5)
WBC: 6.1 10*3/uL (ref 4.0–10.5)
nRBC: 0 % (ref 0.0–0.2)

## 2023-07-02 LAB — COMPREHENSIVE METABOLIC PANEL
ALT: 23 U/L (ref 0–44)
AST: 22 U/L (ref 15–41)
Albumin: 3.7 g/dL (ref 3.5–5.0)
Alkaline Phosphatase: 89 U/L (ref 38–126)
Anion gap: 12 (ref 5–15)
BUN: 13 mg/dL (ref 8–23)
CO2: 24 mmol/L (ref 22–32)
Calcium: 9.1 mg/dL (ref 8.9–10.3)
Chloride: 99 mmol/L (ref 98–111)
Creatinine, Ser: 1.09 mg/dL (ref 0.61–1.24)
GFR, Estimated: >60 mL/min (ref >60)
Glucose, Bld: 207 mg/dL — ABNORMAL HIGH (ref 70–99)
Potassium: 4.1 mmol/L (ref 3.5–5.1)
Sodium: 135 mmol/L (ref 135–145)
Total Bilirubin: 0.6 mg/dL (ref 0.0–1.2)
Total Protein: 7.6 g/dL (ref 6.5–8.1)

## 2023-07-02 MED ORDER — IOHEXOL 300 MG/ML  SOLN
80.0000 mL | Freq: Once | INTRAMUSCULAR | Status: AC | PRN
  Administered 2023-07-02: 80 mL via INTRAVENOUS

## 2023-07-03 ENCOUNTER — Ambulatory Visit: Payer: Medicare HMO

## 2023-07-03 DIAGNOSIS — J4550 Severe persistent asthma, uncomplicated: Secondary | ICD-10-CM

## 2023-07-08 NOTE — Progress Notes (Signed)
[facility] [address], [place] [address]    Clinic Day:  07/09/23   Referring physician: Elfredia Nevins, MD  Patient Care Team: Elfredia Nevins, MD as PCP - General (Internal Medicine) Wyline Mood Dorothe Pea, MD as PCP - Cardiology (Cardiology) Alfonse Spruce, MD as Consulting Physician (Allergy and Immunology) Corbin Ade, MD as Consulting Physician (Gastroenterology) Kennon Rounds as Physician Assistant (Cardiology)   ASSESSMENT & PLAN:   Assessment: 1.  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 -He was felt not to be a surgical candidate. -SBRT to the right lung lesion from 05/05/2018 through 05/12/2018, 54 Gray in 3 fractions of 18 Wallace Cullens. -He is continuing to smoke 2 packs of cigarettes per day. -CT chest on 04/20/2020 shows unchanged posttreatment appearance of irregular nodule of the posterior right upper lobe measuring 2.1 x 0.8 cm.  Stable small pulmonary nodules.  Unchanged prominent pretracheal lymph node measuring 2.4 x 0.9 cm.  Plan: 1.  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 - He denies any respiratory infections in the last 6 months. - Reviewed labs from 07/02/2023: Normal LFTs and CBC. - Reviewed CT chest from 07/02/2023: Posttreatment scarring in the right upper lobe with no evidence of recurrence. - He has completed 5 years of close surveillance normal of 2024.  I will switch him to once a year follow-up with CT scan of the chest.  No orders of the defined types were placed in this encounter.     Alben Deeds Teague,acting as a Neurosurgeon for Doreatha Massed, MD.,have documented all relevant documentation on the behalf of Doreatha Massed, MD,as directed by  Doreatha Massed, MD while in the presence of Doreatha Massed, MD.  I,  Doreatha Massed MD, have reviewed the above documentation for accuracy and completeness, and I agree with the above.    Doreatha Massed, MD   1/16/20254:39 PM  CHIEF COMPLAINT:   Diagnosis: squamous cell carcinoma of the right lung   Cancer Staging  Squamous cell carcinoma of right lung [facility]) Staging form: Lung, AJCC 8th Edition - Clinical stage from 03/29/2018: Stage IA3 (cT1c, cN0, cM0) - Signed by Doreatha Massed, MD on 03/29/2018    Prior Therapy: SBRT 54 Gy in 3 fractions to right lung from 05/05/2018 to 05/12/2018   NGS Results: not done   Current Therapy:  surveillance    HISTORY OF PRESENT ILLNESS:   Oncology History  Squamous cell carcinoma of right lung (HCC)  03/29/2018 Initial Diagnosis   Squamous cell carcinoma of right lung (HCC)   03/29/2018 Cancer Staging   Staging form: Lung, AJCC 8th Edition - Clinical stage from 03/29/2018: Stage IA3 (cT1c, cN0, cM0) - Signed by Doreatha Massed, MD on 03/29/2018      INTERVAL HISTORY:   Kevin Frank is a 83 y.o. male presenting to clinic today for follow up of squamous cell carcinoma of the right lung. He was last seen by me on 01/01/23.  Since his last visit, he underwent CT chest  on 07/02/23 that found: post treatment scarring in the posteromedial right upper lobe; no evidence of recurrent or metastatic disease; coronary artery calcification; and enlarged pulmonic trunk, indicative of pulmonary arterial hypertension.  Of note, he was admitted to the [facility] from 05/26/23 to 05/29/23 for sepsis due to pneumonia. He was given  vancomycin, cefepime, and flagyl while hospitalized and discharged with 7 day course of oral Levaquin.   Today, he states that he is doing well overall. His appetite level is at 100%. His energy level is at 100%. He reports no health issues. He denies any respiratory issues or infections in the last 6 months.   PAST MEDICAL HISTORY:   Past Medical History: Past Medical History:   Diagnosis Date   AAA (abdominal aortic aneurysm) (HCC) 01/2008   3.2cm   Allergy    Arthritis    Asthma    BPH (benign prostatic hyperplasia)    Chronic knee pain    bilateral   COPD (chronic obstructive pulmonary disease) (HCC)    Diabetes mellitus    type II   Gout    History of kidney stones    Hypercholesterolemia    Hyperlipidemia    Hypertension    Lung cancer (HCC) 2019   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    Normal nuclear stress test 2011   Obesity    Obstructive sleep apnea on CPAP    Presence of indwelling urinary catheter    Shortness of breath    due to cigarette abuse   Tobacco abuse     Surgical History: Past Surgical History:  Procedure Laterality Date   ABDOMINAL AORTIC ENDOVASCULAR FENESTRATED STENT GRAFT N/A 04/09/2016   Procedure: ABDOMINAL AORTIC ENDOVASCULAR FENESTRATED STENT GRAFT with left brachial artery access using ultrasound guidance;  Surgeon: Nada Libman, MD;  Location: [facility] OR;  Service: Vascular;  Laterality: N/A;   CHOLECYSTECTOMY  1997   [facility]   COLONOSCOPY  2006   2006: multiple diminutive polyps in rectum, one pedunculated polyp at 10 cm s/p snare removal. Left-sided diverticulum, pedunculated polyps at 35 cm and splenic flexure s/p snare removal. No path available.    COLONOSCOPY WITH PROPOFOL N/A 11/24/2019   Procedure: COLONOSCOPY WITH PROPOFOL;  Surgeon: Corbin Ade, MD;  Location: [facility] ENDO SUITE;  Service: Endoscopy;  Laterality: N/A;  9:45am   PERIPHERAL VASCULAR CATHETERIZATION N/A 04/10/2016   Procedure: Renal Angiography;  Surgeon: Chuck Hint, MD;  Location: [facility] INVASIVE CV LAB;  Service: Cardiovascular;  Laterality: N/A;   POLYPECTOMY  11/24/2019   Procedure: POLYPECTOMY;  Surgeon: Corbin Ade, MD;  Location: [facility] ENDO SUITE;  Service: Endoscopy;;   TRANSURETHRAL RESECTION OF PROSTATE  01/07/2011   Procedure: TRANSURETHRAL RESECTION OF THE PROSTATE (TURP);  Surgeon: Ky Barban;   Location: [facility] ORS;  Service: Urology;  Laterality: N/A;    Social History: Social History   Socioeconomic History   Marital status: Married    Spouse name: Okey Regal   Number of children: 2   Years of education: Not on file   Highest education level: Not on file  Occupational History   Not on file  Tobacco Use   Smoking status: Every Day    Current packs/day: 2.00    Average packs/day: 2.0 packs/day for 50.0 years (100.0 ttl pk-yrs)    Types: Cigarettes    Passive exposure: Never   Smokeless tobacco: Never   Tobacco comments:    Smokes 2 packs a day [facility] 10/17/2021   Vaping Use   Vaping status: Never Used  Substance and Sexual Activity   Alcohol use: Yes    Alcohol/week: 0.0 standard drinks of alcohol    Comment: very little   Drug use: No   Sexual activity: Not on file  Other Topics Concern   Not on  file  Social History Narrative   Lives at home with wife   Social Drivers of Health   Financial Resource Strain: Low Risk  (03/05/2022)   Overall Financial Resource Strain (CARDIA)    Difficulty of Paying Living Expenses: Not hard at all  Food Insecurity: No Food Insecurity (05/29/2023)   Received from [facility]   Hunger Vital Sign    Worried About Running Out of Food in the Last Year: Never true    Ran Out of Food in the Last Year: Never true  Transportation Needs: No Transportation Needs (03/05/2022)   PRAPARE - Administrator, Civil Service (Medical): No    Lack of Transportation (Non-Medical): No  Physical Activity: Not on file  Stress: Not on file  Social Connections: Not on file  Intimate Partner Violence: Not on file    Family History: Family History  Problem Relation Age of Onset   Liver disease Father    Lymphoma Mother    Diabetes Mother    Hypertension Mother    Cancer Mother    Mental retardation Other        sibling -- from birth    Asthma Daughter    Allergic rhinitis Neg Hx    Angioedema Neg Hx    Atopy Neg Hx    Eczema Neg Hx     Immunodeficiency Neg Hx    Colon cancer Neg Hx     Current Medications:  Current Outpatient Medications:    albuterol (PROVENTIL) (2.5 MG/3ML) 0.083% nebulizer solution, Take 3 mLs (2.5 mg total) by nebulization every 6 (six) hours as needed for wheezing or shortness of breath., Disp: 150 mL, Rfl: 2   albuterol (VENTOLIN HFA) 108 (90 Base) MCG/ACT inhaler, Use 4 puffs every 4-6 hours as needed for cough or wheeze, Disp: 18 g, Rfl: 2   allopurinol (ZYLOPRIM) 300 MG tablet, Take 1 tablet (300 mg total) by mouth daily., Disp: 30 tablet, Rfl: 1   amLODipine (NORVASC) 10 MG tablet, Take 1 tablet (10 mg total) by mouth daily., Disp: 14 tablet, Rfl: 0   atorvastatin (LIPITOR) 40 MG tablet, Take 1 tablet (40 mg total) by mouth daily., Disp: 90 tablet, Rfl: 2   clopidogrel (PLAVIX) 75 MG tablet, Take 1 tablet (75 mg total) by mouth daily., Disp: 30 tablet, Rfl: 1   Cobalamin Combinations (NEURIVA PLUS) CAPS, Take 1 tablet by mouth daily., Disp: , Rfl:    EPINEPHrine 0.3 mg/0.3 mL IJ SOAJ injection, Inject 0.3 mg into the muscle as needed for anaphylaxis., Disp: 1 each, Rfl: 1   fluticasone-salmeterol (WIXELA INHUB) 500-50 MCG/ACT AEPB, Inhale 1 puff into the lungs in the morning and at bedtime., Disp: 30 each, Rfl: 5   Fluticasone-Umeclidin-Vilant (TRELEGY ELLIPTA) 100-62.5-25 MCG/ACT AEPB, Inhale 1 puff into the lungs daily., Disp: 180 each, Rfl: 3   hydrALAZINE (APRESOLINE) 100 MG tablet, Take 1 tablet (100 mg total) by mouth 2 (two) times daily., Disp: 180 tablet, Rfl: 2   JARDIANCE 10 MG TABS tablet, Take 10 mg by mouth daily., Disp: , Rfl:    losartan (COZAAR) 100 MG tablet, TAKE 1 TABLET BY MOUTH ONCE DAILY., Disp: 30 tablet, Rfl: 0   metFORMIN (GLUCOPHAGE) 500 MG tablet, Take 1,000 mg by mouth 2 (two) times daily., Disp: , Rfl:    metoprolol succinate (TOPROL-XL) 25 MG 24 hr tablet, Take 1 tablet (25 mg total) by mouth daily., Disp: 14 tablet, Rfl: 0   sertraline (ZOLOFT) 50 MG tablet, Take 1  tablet (50 mg total) by mouth daily., Disp: 30 tablet, Rfl: 1   Tezepelumab-ekko (TEZSPIRE) 210 MG/1. SOAJ, Inject 210 mg into the skin every 28 (twenty-eight) days., Disp: 1.91 mL, Rfl: 11   torsemide (DEMADEX) 20 MG tablet, Take 1 tablet (20 mg total) by mouth daily., Disp: , Rfl:   Current Facility-Administered Medications:    methylPREDNISolone acetate (DEPO-MEDROL) injection 40 mg, 40 mg, Intra-articular, Once, Vickki Hearing, MD   tezepelumab-ekko (TEZSPIRE) 210 MG/1. syringe 210 mg, 210 mg, Subcutaneous, Q28 days, Alfonse Spruce, MD, 210 mg at 07/03/23 7829   Allergies: Allergies  Allergen Reactions   No Known Allergies     REVIEW OF SYSTEMS:   Review of Systems  Constitutional:  Negative for chills, fatigue and fever.  HENT:   Negative for lump/mass, mouth sores, nosebleeds, sore throat and trouble swallowing.   Eyes:  Negative for eye problems.  Respiratory:  Positive for cough and shortness of breath.   Cardiovascular:  Negative for chest pain, leg swelling and palpitations.  Gastrointestinal:  Negative for abdominal pain, constipation, diarrhea, nausea and vomiting.  Genitourinary:  Negative for bladder incontinence, difficulty urinating, dysuria, frequency, hematuria and nocturia.   Musculoskeletal:  Negative for arthralgias, back pain, flank pain, myalgias and neck pain.  Skin:  Negative for itching and rash.  Neurological:  Negative for dizziness, headaches and numbness.  Hematological:  Does not bruise/bleed easily.  Psychiatric/Behavioral:  Negative for depression, sleep disturbance and suicidal ideas. The patient is not nervous/anxious.   All other systems reviewed and are negative.    VITALS:   Blood pressure (!) 176/67, pulse 60, temperature (!) 96.6 F (35.9 C), temperature source Tympanic, resp. rate 20, weight 219 lb 12.8 oz (99.7 kg), SpO2 95%.  Wt Readings from Last 3 Encounters:  07/09/23 219 lb 12.8 oz (99.7 kg)  05/19/23 226 lb 3.2  oz (102.6 kg)  05/01/23 228 lb 12.8 oz (103.8 kg)    Body mass index is 31.54 kg/m.  Performance status (ECOG): 1 - Symptomatic but completely ambulatory  PHYSICAL EXAM:   Physical Exam Vitals and nursing note reviewed. Exam conducted with a chaperone present.  Constitutional:      Appearance: Normal appearance.  Cardiovascular:     Rate and Rhythm: Normal rate and regular rhythm.     Pulses: Normal pulses.     Heart sounds: Normal heart sounds.  Pulmonary:     Effort: Pulmonary effort is normal.     Breath sounds: Normal breath sounds.  Abdominal:     Palpations: Abdomen is soft. There is no hepatomegaly, splenomegaly or mass.     Tenderness: There is no abdominal tenderness.  Musculoskeletal:     Right lower leg: No edema.     Left lower leg: No edema.  Lymphadenopathy:     Cervical: No cervical adenopathy.     Right cervical: No superficial, deep or posterior cervical adenopathy.    Left cervical: No superficial, deep or posterior cervical adenopathy.     Upper Body:     Right upper body: No supraclavicular or axillary adenopathy.     Left upper body: No supraclavicular or axillary adenopathy.  Neurological:     General: No focal deficit present.     Mental Status: He is alert and oriented to person, place, and time.  Psychiatric:        Mood and Affect: Mood normal.        Behavior: Behavior normal.     LABS:  Latest Ref Rng & Units 07/02/2023    9:03 AM 12/29/2022   10:59 AM 06/26/2022    1:46 PM  CBC  WBC 4.0 - 10.5 K/uL 6.1  12.4  6.2   Hemoglobin 13.0 - 17.0 g/dL 02.7  25.3  66.4   Hematocrit 39.0 - 52.0 % 50.1  46.8  43.0   Platelets 150 - 400 K/uL 150  176  142       Latest Ref Rng & Units 07/02/2023    9:03 AM 12/29/2022   10:59 AM 11/04/2022   12:13 PM  CMP  Glucose 70 - 99 mg/dL 403  474    BUN 8 - 23 mg/dL 13  21    Creatinine 2.59 - 1.24 mg/dL 5.63  8.75  6.43   Sodium 135 - 145 mmol/L 135  132    Potassium 3.5 - 5.1 mmol/L 4.1  3.8     Chloride 98 - 111 mmol/L 99  98    CO2 22 - 32 mmol/L 24  24    Calcium 8.9 - 10.3 mg/dL 9.1  9.2    Total Protein 6.5 - 8.1 g/dL 7.6  7.3    Total Bilirubin 0.0 - 1.2 mg/dL 0.6  0.6    Alkaline Phos 38 - 126 U/L 89  112    AST 15 - 41 U/L 22  16    ALT 0 - 44 U/L 23  18       No results found for: "CEA1", "CEA" / No results found for: "CEA1", "CEA" No results found for: "PSA1" No results found for: "PIR518" No results found for: "CAN125"  No results found for: "TOTALPROTELP", "ALBUMINELP", "A1GS", "A2GS", "BETS", "BETA2SER", "GAMS", "MSPIKE", "SPEI" No results found for: "TIBC", "FERRITIN", "IRONPCTSAT" Lab Results  Component Value Date   LDH 106 04/26/2019   LDH 110 11/17/2018   LDH 111 07/27/2018     STUDIES:   CT Chest W Contrast Result Date: 07/08/2023 CLINICAL DATA:  Non-small cell lung cancer.  * Tracking Code: BO * EXAM: CT CHEST WITH CONTRAST TECHNIQUE: Multidetector CT imaging of the chest was performed during intravenous contrast administration. RADIATION DOSE REDUCTION: This exam was performed according to the departmental dose-optimization program which includes automated exposure control, adjustment of the mA and/or kV according to patient size and/or use of iterative reconstruction technique. CONTRAST:  80mL OMNIPAQUE IOHEXOL 300 MG/ML  SOLN COMPARISON:  11/04/2022. FINDINGS: Cardiovascular: Atherosclerotic calcification of the aorta, aortic valve and coronary arteries. Enlarged pulmonic trunk and heart. No pericardial effusion. Mediastinum/Nodes: No pathologically enlarged mediastinal, hilar or axillary lymph nodes. Air in the esophagus can be seen with dysmotility. Lungs/Pleura: Centrilobular emphysema. Pulmonary retraction and architectural distortion in the posteromedial right upper lobe (3/41), unchanged from 11/04/2022. Mild bibasilar scarring. No suspicious pulmonary nodules. 4 mm lateral left lower lobe nodule (3/136), unchanged from 06/26/2022 and considered  benign. No pleural fluid. Debris in the airway. Upper Abdomen: Cholecystectomy. Low-attenuation lesions in the kidneys. No specific follow-up necessary. Endovascular stent graft in the aorta. Visualized portions of the liver, adrenal glands, kidneys, spleen, pancreas, stomach and bowel are otherwise grossly unremarkable. No upper abdominal adenopathy. Musculoskeletal: Degenerative changes in the spine. Osteopenia. Old bilateral rib fractures. Old T3 superior endplate compression fracture. No worrisome lytic or sclerotic lesions. IMPRESSION: 1. Post treatment scarring in the posteromedial right upper lobe. No evidence of recurrent or metastatic disease. 2. Aortic atherosclerosis (ICD10-I70.0). Coronary artery calcification. 3. Enlarged pulmonic trunk, indicative of pulmonary arterial hypertension. 4.  Emphysema (ICD10-J43.9). Electronically  Signed   By: Leanna Battles M.D.   On: 07/08/2023 15:22

## 2023-07-09 ENCOUNTER — Other Ambulatory Visit: Payer: Self-pay | Admitting: Hematology

## 2023-07-09 ENCOUNTER — Inpatient Hospital Stay (HOSPITAL_BASED_OUTPATIENT_CLINIC_OR_DEPARTMENT_OTHER): Payer: Medicare HMO | Admitting: Hematology

## 2023-07-09 VITALS — BP 176/67 | HR 60 | Temp 96.6°F | Resp 20 | Wt 219.8 lb

## 2023-07-09 DIAGNOSIS — Z807 Family history of other malignant neoplasms of lymphoid, hematopoietic and related tissues: Secondary | ICD-10-CM | POA: Diagnosis not present

## 2023-07-09 DIAGNOSIS — Z79899 Other long term (current) drug therapy: Secondary | ICD-10-CM | POA: Diagnosis not present

## 2023-07-09 DIAGNOSIS — C3491 Malignant neoplasm of unspecified part of right bronchus or lung: Secondary | ICD-10-CM

## 2023-07-09 DIAGNOSIS — F17210 Nicotine dependence, cigarettes, uncomplicated: Secondary | ICD-10-CM | POA: Diagnosis not present

## 2023-07-09 NOTE — Patient Instructions (Signed)
[name] at [facility] Discharge Instructions   You were seen and examined today by Dr. Ellin Saba.  He reviewed the results of your lab work which are normal/stable.   He reviewed the results of your CT scan which was good.   We will see you back in one year. We will repeat lab work and a CT scan prior to this visit.    Return as scheduled.    Thank you for choosing [facility] at [facility] to provide your oncology and hematology care.  To afford each patient quality time with our provider, please arrive at least 15 minutes before your scheduled appointment time.   If you have a lab appointment with the Cancer Center please come in thru the Main Entrance and check in at the main information desk.  You need to re-schedule your appointment should you arrive 10 or more minutes late.  We strive to give you quality time with our providers, and arriving late affects you and other patients whose appointments are after yours.  Also, if you no show three or more times for appointments you may be dismissed from the clinic at the providers discretion.     Again, thank you for choosing [facility].  Our hope is that these requests will decrease the amount of time that you wait before being seen by our physicians.       _____________________________________________________________  Should you have questions after your visit to [facility], please contact our office at [phone] and follow the prompts.  Our office hours are 8:00 a.m. and 4:30 p.m. Monday - Friday.  Please note that voicemails left after 4:00 p.m. may not be returned until the following business day.  We are closed weekends and major holidays.  You do have access to a nurse 24-7, just call the main number to the clinic [phone] and do not press any options, hold on the line and a nurse will answer the phone.    For prescription refill requests, have your  pharmacy contact our office and allow 72 hours.    Due to Covid, you will need to wear a mask upon entering the hospital. If you do not have a mask, a mask will be given to you at the Main Entrance upon arrival. For doctor visits, patients may have 1 support person age 10 or older with them. For treatment visits, patients can not have anyone with them due to social distancing guidelines and our immunocompromised population.

## 2023-07-10 ENCOUNTER — Ambulatory Visit: Payer: Medicare HMO | Attending: Nurse Practitioner | Admitting: Nurse Practitioner

## 2023-07-27 ENCOUNTER — Telehealth: Payer: Self-pay | Admitting: *Deleted

## 2023-07-27 NOTE — Telephone Encounter (Signed)
Patient wife called regarding Tezspire I advised still pending PAP approval and will try to use samples until we can get resolved

## 2023-07-28 ENCOUNTER — Other Ambulatory Visit: Payer: Self-pay

## 2023-07-28 ENCOUNTER — Other Ambulatory Visit: Payer: Self-pay | Admitting: *Deleted

## 2023-07-28 MED ORDER — TEZSPIRE 210 MG/1.91ML ~~LOC~~ SOAJ: 210.0000 mg | SUBCUTANEOUS | 11 refills | Status: DC

## 2023-07-29 ENCOUNTER — Other Ambulatory Visit (HOSPITAL_COMMUNITY): Payer: Self-pay

## 2023-08-03 ENCOUNTER — Ambulatory Visit: Payer: Medicare HMO

## 2023-08-03 ENCOUNTER — Telehealth: Payer: Self-pay

## 2023-08-03 NOTE — Telephone Encounter (Signed)
 Patient had a immunotherapy appointment for Tezspire  today. No samples of Tezspire  is available due to a bad batch being sent out. Patient will need to be called back and rescheduled for an appointment once we receive samples of Tezspire 

## 2023-08-04 ENCOUNTER — Inpatient Hospital Stay (HOSPITAL_COMMUNITY)
Admission: EM | Admit: 2023-08-04 | Discharge: 2023-08-11 | DRG: 322 | Disposition: A | Discharge status: Home Health | Payer: Medicare HMO | Attending: Cardiology | Admitting: Cardiology

## 2023-08-04 ENCOUNTER — Telehealth (HOSPITAL_COMMUNITY): Payer: Self-pay | Admitting: Pharmacy Technician

## 2023-08-04 ENCOUNTER — Encounter (HOSPITAL_COMMUNITY)
Admission: EM | Disposition: A | Discharge status: Home Health | Payer: Self-pay | Source: Home / Self Care | Attending: Cardiology

## 2023-08-04 ENCOUNTER — Other Ambulatory Visit (HOSPITAL_COMMUNITY): Payer: Self-pay

## 2023-08-04 ENCOUNTER — Other Ambulatory Visit: Payer: Self-pay

## 2023-08-04 DIAGNOSIS — Z8249 Family history of ischemic heart disease and other diseases of the circulatory system: Secondary | ICD-10-CM | POA: Diagnosis not present

## 2023-08-04 DIAGNOSIS — J4489 Other specified chronic obstructive pulmonary disease: Secondary | ICD-10-CM | POA: Diagnosis present

## 2023-08-04 DIAGNOSIS — I480 Paroxysmal atrial fibrillation: Secondary | ICD-10-CM | POA: Diagnosis not present

## 2023-08-04 DIAGNOSIS — Z9981 Dependence on supplemental oxygen: Secondary | ICD-10-CM

## 2023-08-04 DIAGNOSIS — N390 Urinary tract infection, site not specified: Secondary | ICD-10-CM

## 2023-08-04 DIAGNOSIS — Z807 Family history of other malignant neoplasms of lymphoid, hematopoietic and related tissues: Secondary | ICD-10-CM

## 2023-08-04 DIAGNOSIS — E669 Obesity, unspecified: Secondary | ICD-10-CM | POA: Diagnosis present

## 2023-08-04 DIAGNOSIS — I4892 Unspecified atrial flutter: Secondary | ICD-10-CM | POA: Diagnosis not present

## 2023-08-04 DIAGNOSIS — R338 Other retention of urine: Secondary | ICD-10-CM | POA: Diagnosis present

## 2023-08-04 DIAGNOSIS — Z7902 Long term (current) use of antithrombotics/antiplatelets: Secondary | ICD-10-CM

## 2023-08-04 DIAGNOSIS — I2510 Atherosclerotic heart disease of native coronary artery without angina pectoris: Secondary | ICD-10-CM | POA: Diagnosis not present

## 2023-08-04 DIAGNOSIS — I5031 Acute diastolic (congestive) heart failure: Secondary | ICD-10-CM | POA: Diagnosis not present

## 2023-08-04 DIAGNOSIS — N401 Enlarged prostate with lower urinary tract symptoms: Secondary | ICD-10-CM | POA: Diagnosis present

## 2023-08-04 DIAGNOSIS — R339 Retention of urine, unspecified: Secondary | ICD-10-CM | POA: Diagnosis not present

## 2023-08-04 DIAGNOSIS — N179 Acute kidney failure, unspecified: Secondary | ICD-10-CM | POA: Diagnosis present

## 2023-08-04 DIAGNOSIS — I2119 ST elevation (STEMI) myocardial infarction involving other coronary artery of inferior wall: Secondary | ICD-10-CM | POA: Diagnosis not present

## 2023-08-04 DIAGNOSIS — I213 ST elevation (STEMI) myocardial infarction of unspecified site: Secondary | ICD-10-CM | POA: Diagnosis not present

## 2023-08-04 DIAGNOSIS — Z9049 Acquired absence of other specified parts of digestive tract: Secondary | ICD-10-CM

## 2023-08-04 DIAGNOSIS — H9190 Unspecified hearing loss, unspecified ear: Secondary | ICD-10-CM | POA: Diagnosis present

## 2023-08-04 DIAGNOSIS — M542 Cervicalgia: Secondary | ICD-10-CM | POA: Diagnosis not present

## 2023-08-04 DIAGNOSIS — I2111 ST elevation (STEMI) myocardial infarction involving right coronary artery: Secondary | ICD-10-CM | POA: Diagnosis not present

## 2023-08-04 DIAGNOSIS — E876 Hypokalemia: Secondary | ICD-10-CM

## 2023-08-04 DIAGNOSIS — Z6830 Body mass index (BMI) 30.0-30.9, adult: Secondary | ICD-10-CM

## 2023-08-04 DIAGNOSIS — I110 Hypertensive heart disease with heart failure: Secondary | ICD-10-CM | POA: Diagnosis not present

## 2023-08-04 DIAGNOSIS — J4550 Severe persistent asthma, uncomplicated: Secondary | ICD-10-CM | POA: Diagnosis not present

## 2023-08-04 DIAGNOSIS — G9340 Encephalopathy, unspecified: Secondary | ICD-10-CM | POA: Diagnosis not present

## 2023-08-04 DIAGNOSIS — D72829 Elevated white blood cell count, unspecified: Secondary | ICD-10-CM | POA: Diagnosis not present

## 2023-08-04 DIAGNOSIS — E872 Acidosis, unspecified: Secondary | ICD-10-CM | POA: Diagnosis not present

## 2023-08-04 DIAGNOSIS — Z833 Family history of diabetes mellitus: Secondary | ICD-10-CM

## 2023-08-04 DIAGNOSIS — R410 Disorientation, unspecified: Secondary | ICD-10-CM | POA: Diagnosis not present

## 2023-08-04 DIAGNOSIS — Z85118 Personal history of other malignant neoplasm of bronchus and lung: Secondary | ICD-10-CM

## 2023-08-04 DIAGNOSIS — J9611 Chronic respiratory failure with hypoxia: Secondary | ICD-10-CM | POA: Diagnosis not present

## 2023-08-04 DIAGNOSIS — E119 Type 2 diabetes mellitus without complications: Secondary | ICD-10-CM | POA: Diagnosis not present

## 2023-08-04 DIAGNOSIS — R0902 Hypoxemia: Secondary | ICD-10-CM | POA: Diagnosis not present

## 2023-08-04 DIAGNOSIS — I4891 Unspecified atrial fibrillation: Secondary | ICD-10-CM | POA: Diagnosis not present

## 2023-08-04 DIAGNOSIS — Z923 Personal history of irradiation: Secondary | ICD-10-CM

## 2023-08-04 DIAGNOSIS — I700 Atherosclerosis of aorta: Secondary | ICD-10-CM | POA: Diagnosis not present

## 2023-08-04 DIAGNOSIS — Z7951 Long term (current) use of inhaled steroids: Secondary | ICD-10-CM

## 2023-08-04 DIAGNOSIS — Z955 Presence of coronary angioplasty implant and graft: Secondary | ICD-10-CM

## 2023-08-04 DIAGNOSIS — I5032 Chronic diastolic (congestive) heart failure: Secondary | ICD-10-CM | POA: Diagnosis not present

## 2023-08-04 DIAGNOSIS — E7800 Pure hypercholesterolemia, unspecified: Secondary | ICD-10-CM | POA: Diagnosis present

## 2023-08-04 DIAGNOSIS — Z825 Family history of asthma and other chronic lower respiratory diseases: Secondary | ICD-10-CM

## 2023-08-04 DIAGNOSIS — R404 Transient alteration of awareness: Secondary | ICD-10-CM | POA: Diagnosis not present

## 2023-08-04 DIAGNOSIS — Z7982 Long term (current) use of aspirin: Secondary | ICD-10-CM

## 2023-08-04 DIAGNOSIS — F17210 Nicotine dependence, cigarettes, uncomplicated: Secondary | ICD-10-CM | POA: Diagnosis not present

## 2023-08-04 DIAGNOSIS — Z7984 Long term (current) use of oral hypoglycemic drugs: Secondary | ICD-10-CM | POA: Diagnosis not present

## 2023-08-04 DIAGNOSIS — Z79899 Other long term (current) drug therapy: Secondary | ICD-10-CM | POA: Diagnosis not present

## 2023-08-04 DIAGNOSIS — G4733 Obstructive sleep apnea (adult) (pediatric): Secondary | ICD-10-CM | POA: Diagnosis present

## 2023-08-04 DIAGNOSIS — R0789 Other chest pain: Secondary | ICD-10-CM | POA: Diagnosis present

## 2023-08-04 DIAGNOSIS — N3000 Acute cystitis without hematuria: Secondary | ICD-10-CM | POA: Diagnosis not present

## 2023-08-04 DIAGNOSIS — Z8679 Personal history of other diseases of the circulatory system: Secondary | ICD-10-CM

## 2023-08-04 DIAGNOSIS — L270 Generalized skin eruption due to drugs and medicaments taken internally: Secondary | ICD-10-CM | POA: Diagnosis not present

## 2023-08-04 DIAGNOSIS — Z9079 Acquired absence of other genital organ(s): Secondary | ICD-10-CM

## 2023-08-04 DIAGNOSIS — R9431 Abnormal electrocardiogram [ECG] [EKG]: Secondary | ICD-10-CM | POA: Diagnosis not present

## 2023-08-04 DIAGNOSIS — Z810 Family history of intellectual disabilities: Secondary | ICD-10-CM

## 2023-08-04 DIAGNOSIS — I2129 ST elevation (STEMI) myocardial infarction involving other sites: Secondary | ICD-10-CM | POA: Diagnosis not present

## 2023-08-04 DIAGNOSIS — T462X5A Adverse effect of other antidysrhythmic drugs, initial encounter: Secondary | ICD-10-CM | POA: Diagnosis not present

## 2023-08-04 DIAGNOSIS — M109 Gout, unspecified: Secondary | ICD-10-CM | POA: Diagnosis present

## 2023-08-04 HISTORY — PX: CORONARY/GRAFT ACUTE MI REVASCULARIZATION: CATH118305

## 2023-08-04 LAB — BASIC METABOLIC PANEL
Anion gap: 14 (ref 5–15)
BUN: 11 mg/dL (ref 8–23)
CO2: 30 mmol/L (ref 22–32)
Calcium: 9.1 mg/dL (ref 8.9–10.3)
Chloride: 95 mmol/L — ABNORMAL LOW (ref 98–111)
Creatinine, Ser: 1.25 mg/dL — ABNORMAL HIGH (ref 0.61–1.24)
GFR, Estimated: 57 mL/min — ABNORMAL LOW (ref >60)
Glucose, Bld: 252 mg/dL — ABNORMAL HIGH (ref 70–99)
Potassium: 3.3 mmol/L — ABNORMAL LOW (ref 3.5–5.1)
Sodium: 139 mmol/L (ref 135–145)

## 2023-08-04 LAB — POCT ACTIVATED CLOTTING TIME
Activated Clotting Time: 250 s
Activated Clotting Time: 314 s
Activated Clotting Time: 320 s
Activated Clotting Time: 377 s

## 2023-08-04 LAB — POCT I-STAT 7, (LYTES, BLD GAS, ICA,H+H)
Acid-Base Excess: 4 mmol/L — ABNORMAL HIGH (ref 0.0–2.0)
Acid-base deficit: 6 mmol/L — ABNORMAL HIGH (ref 0.0–2.0)
Bicarbonate: 17.5 mmol/L — ABNORMAL LOW (ref 20.0–28.0)
Bicarbonate: 29.2 mmol/L — ABNORMAL HIGH (ref 20.0–28.0)
Calcium, Ion: 1.08 mmol/L — ABNORMAL LOW (ref 1.15–1.40)
Calcium, Ion: 1.18 mmol/L (ref 1.15–1.40)
HCT: 40 % (ref 39.0–52.0)
HCT: 40 % (ref 39.0–52.0)
Hemoglobin: 13.6 g/dL (ref 13.0–17.0)
Hemoglobin: 13.6 g/dL (ref 13.0–17.0)
O2 Saturation: 87 %
O2 Saturation: 92 %
Potassium: 2.8 mmol/L — ABNORMAL LOW (ref 3.5–5.1)
Potassium: 3.3 mmol/L — ABNORMAL LOW (ref 3.5–5.1)
Sodium: 136 mmol/L (ref 135–145)
Sodium: 140 mmol/L (ref 135–145)
TCO2: 18 mmol/L — ABNORMAL LOW (ref 22–32)
TCO2: 31 mmol/L (ref 22–32)
pCO2 arterial: 28.9 mm[Hg] — ABNORMAL LOW (ref 32–48)
pCO2 arterial: 43.5 mm[Hg] (ref 32–48)
pH, Arterial: 7.391 (ref 7.35–7.45)
pH, Arterial: 7.435 (ref 7.35–7.45)
pO2, Arterial: 51 mm[Hg] — ABNORMAL LOW (ref 83–108)
pO2, Arterial: 63 mm[Hg] — ABNORMAL LOW (ref 83–108)

## 2023-08-04 LAB — COMPREHENSIVE METABOLIC PANEL
ALT: 26 U/L (ref 0–44)
AST: 44 U/L — ABNORMAL HIGH (ref 15–41)
Albumin: 2.9 g/dL — ABNORMAL LOW (ref 3.5–5.0)
Alkaline Phosphatase: 67 U/L (ref 38–126)
Anion gap: 14 (ref 5–15)
BUN: 12 mg/dL (ref 8–23)
CO2: 27 mmol/L (ref 22–32)
Calcium: 8.2 mg/dL — ABNORMAL LOW (ref 8.9–10.3)
Chloride: 97 mmol/L — ABNORMAL LOW (ref 98–111)
Creatinine, Ser: 1.29 mg/dL — ABNORMAL HIGH (ref 0.61–1.24)
GFR, Estimated: 55 mL/min — ABNORMAL LOW (ref >60)
Glucose, Bld: 268 mg/dL — ABNORMAL HIGH (ref 70–99)
Potassium: 2.8 mmol/L — ABNORMAL LOW (ref 3.5–5.1)
Sodium: 138 mmol/L (ref 135–145)
Total Bilirubin: 0.8 mg/dL (ref 0.0–1.2)
Total Protein: 5.6 g/dL — ABNORMAL LOW (ref 6.5–8.1)

## 2023-08-04 LAB — CBC WITH DIFFERENTIAL/PLATELET
Abs Immature Granulocytes: 0.1 10*3/uL — ABNORMAL HIGH (ref 0.00–0.07)
Basophils Absolute: 0.1 10*3/uL (ref 0.0–0.1)
Basophils Relative: 0 %
Eosinophils Absolute: 0.1 10*3/uL (ref 0.0–0.5)
Eosinophils Relative: 0 %
HCT: 38.4 % — ABNORMAL LOW (ref 39.0–52.0)
Hemoglobin: 13.4 g/dL (ref 13.0–17.0)
Immature Granulocytes: 1 %
Lymphocytes Relative: 14 %
Lymphs Abs: 2.1 10*3/uL (ref 0.7–4.0)
MCH: 31.5 pg (ref 26.0–34.0)
MCHC: 34.9 g/dL (ref 30.0–36.0)
MCV: 90.4 fL (ref 80.0–100.0)
Monocytes Absolute: 1 10*3/uL (ref 0.1–1.0)
Monocytes Relative: 7 %
Neutro Abs: 12.1 10*3/uL — ABNORMAL HIGH (ref 1.7–7.7)
Neutrophils Relative %: 78 %
Platelets: 193 10*3/uL (ref 150–400)
RBC: 4.25 MIL/uL (ref 4.22–5.81)
RDW: 13.7 % (ref 11.5–15.5)
WBC: 15.5 10*3/uL — ABNORMAL HIGH (ref 4.0–10.5)
nRBC: 0 % (ref 0.0–0.2)

## 2023-08-04 LAB — HEMOGLOBIN A1C
Hgb A1c MFr Bld: 8.4 % — ABNORMAL HIGH (ref 4.8–5.6)
Mean Plasma Glucose: 194.38 mg/dL

## 2023-08-04 LAB — TROPONIN I (HIGH SENSITIVITY)
Troponin I (High Sensitivity): 1030 ng/L (ref <18)
Troponin I (High Sensitivity): 17294 ng/L (ref <18)

## 2023-08-04 LAB — POCT I-STAT, CHEM 8
BUN: 13 mg/dL (ref 8–23)
Calcium, Ion: 1.19 mmol/L (ref 1.15–1.40)
Chloride: 102 mmol/L (ref 98–111)
Creatinine, Ser: 1.3 mg/dL — ABNORMAL HIGH (ref 0.61–1.24)
Glucose, Bld: 270 mg/dL — ABNORMAL HIGH (ref 70–99)
HCT: 41 % (ref 39.0–52.0)
Hemoglobin: 13.9 g/dL (ref 13.0–17.0)
Potassium: 3.3 mmol/L — ABNORMAL LOW (ref 3.5–5.1)
Sodium: 136 mmol/L (ref 135–145)
TCO2: 17 mmol/L — ABNORMAL LOW (ref 22–32)

## 2023-08-04 LAB — APTT: aPTT: >200 s (ref 24–36)

## 2023-08-04 LAB — LIPID PANEL
Cholesterol: 82 mg/dL (ref 0–200)
HDL: 30 mg/dL — ABNORMAL LOW (ref >40)
LDL Cholesterol: 34 mg/dL (ref 0–99)
Total CHOL/HDL Ratio: 2.7 {ratio}
Triglycerides: 92 mg/dL (ref <150)
VLDL: 18 mg/dL (ref 0–40)

## 2023-08-04 LAB — LACTIC ACID, PLASMA
Lactic Acid, Venous: 2 mmol/L (ref 0.5–1.9)
Lactic Acid, Venous: 1.8 mmol/L (ref 0.5–1.9)

## 2023-08-04 LAB — GLUCOSE, CAPILLARY: Glucose-Capillary: 216 mg/dL — ABNORMAL HIGH (ref 70–99)

## 2023-08-04 LAB — PROTIME-INR
INR: 1.3 — ABNORMAL HIGH (ref 0.8–1.2)
Prothrombin Time: 16.5 s — ABNORMAL HIGH (ref 11.4–15.2)

## 2023-08-04 LAB — MAGNESIUM
Magnesium: 1.6 mg/dL — ABNORMAL LOW (ref 1.7–2.4)
Magnesium: 1.6 mg/dL — ABNORMAL LOW (ref 1.7–2.4)

## 2023-08-04 LAB — CG4 I-STAT (LACTIC ACID): Lactic Acid, Venous: 2.9 mmol/L (ref 0.5–1.9)

## 2023-08-04 SURGERY — CORONARY/GRAFT ACUTE MI REVASCULARIZATION
Anesthesia: LOCAL

## 2023-08-04 MED ORDER — ATORVASTATIN CALCIUM 80 MG PO TABS
80.0000 mg | ORAL_TABLET | Freq: Every day | ORAL | Status: DC
  Administered 2023-08-04 – 2023-08-11 (×8): 80 mg via ORAL
  Filled 2023-08-04 (×8): qty 1

## 2023-08-04 MED ORDER — LIDOCAINE HCL (PF) 1 % IJ SOLN
INTRAMUSCULAR | Status: AC
  Filled 2023-08-04: qty 30

## 2023-08-04 MED ORDER — SODIUM CHLORIDE 0.9% FLUSH
3.0000 mL | Freq: Two times a day (BID) | INTRAVENOUS | Status: DC
  Administered 2023-08-04 – 2023-08-11 (×14): 3 mL via INTRAVENOUS

## 2023-08-04 MED ORDER — TICAGRELOR 90 MG PO TABS
180.0000 mg | ORAL_TABLET | Freq: Once | ORAL | Status: AC
Start: 2023-08-04 — End: 2023-08-04
  Administered 2023-08-04: 180 mg via ORAL
  Filled 2023-08-04: qty 2

## 2023-08-04 MED ORDER — FUROSEMIDE 10 MG/ML IJ SOLN
INTRAMUSCULAR | Status: AC
  Filled 2023-08-04: qty 4

## 2023-08-04 MED ORDER — CANGRELOR BOLUS VIA INFUSION
INTRAVENOUS | Status: DC | PRN
  Administered 2023-08-04: 2994 ug via INTRAVENOUS

## 2023-08-04 MED ORDER — VERAPAMIL HCL 2.5 MG/ML IV SOLN
INTRAVENOUS | Status: AC
  Filled 2023-08-04: qty 2

## 2023-08-04 MED ORDER — TICAGRELOR 90 MG PO TABS
90.0000 mg | ORAL_TABLET | Freq: Two times a day (BID) | ORAL | Status: DC
  Administered 2023-08-04 – 2023-08-05 (×2): 90 mg via ORAL
  Filled 2023-08-04 (×2): qty 1

## 2023-08-04 MED ORDER — NOREPINEPHRINE 4 MG/250ML-% IV SOLN
INTRAVENOUS | Status: AC
  Filled 2023-08-04: qty 250

## 2023-08-04 MED ORDER — MIDAZOLAM HCL 2 MG/2ML IJ SOLN
INTRAMUSCULAR | Status: DC | PRN
  Administered 2023-08-04 (×2): 1 mg via INTRAVENOUS

## 2023-08-04 MED ORDER — SODIUM CHLORIDE 0.9 % IV SOLN
4.0000 ug/kg/min | INTRAVENOUS | Status: DC
  Filled 2023-08-04: qty 50

## 2023-08-04 MED ORDER — ACETAMINOPHEN 325 MG PO TABS
650.0000 mg | ORAL_TABLET | ORAL | Status: DC | PRN
  Administered 2023-08-07: 650 mg via ORAL
  Filled 2023-08-04: qty 2

## 2023-08-04 MED ORDER — TICAGRELOR 90 MG PO TABS
ORAL_TABLET | ORAL | Status: AC
  Filled 2023-08-04: qty 2

## 2023-08-04 MED ORDER — TORSEMIDE 20 MG PO TABS
20.0000 mg | ORAL_TABLET | Freq: Every day | ORAL | Status: DC
  Administered 2023-08-05 – 2023-08-11 (×7): 20 mg via ORAL
  Filled 2023-08-04 (×7): qty 1

## 2023-08-04 MED ORDER — SODIUM CHLORIDE 0.9 % IV SOLN
INTRAVENOUS | Status: AC | PRN
  Administered 2023-08-04: 4 ug/kg/min via INTRAVENOUS

## 2023-08-04 MED ORDER — ONDANSETRON HCL 4 MG/2ML IJ SOLN
INTRAMUSCULAR | Status: DC | PRN
  Administered 2023-08-04: 4 mg via INTRAVENOUS

## 2023-08-04 MED ORDER — POTASSIUM CHLORIDE CRYS ER 20 MEQ PO TBCR
40.0000 meq | EXTENDED_RELEASE_TABLET | ORAL | Status: AC
  Administered 2023-08-04 (×2): 40 meq via ORAL
  Filled 2023-08-04 (×2): qty 2

## 2023-08-04 MED ORDER — SODIUM CHLORIDE 0.9% FLUSH: 3.0000 mL | INTRAVENOUS | Status: DC | PRN

## 2023-08-04 MED ORDER — SODIUM BICARBONATE 8.4 % IV SOLN
INTRAVENOUS | Status: DC | PRN
  Administered 2023-08-04 (×3): 50 meq via INTRAVENOUS

## 2023-08-04 MED ORDER — MIDAZOLAM HCL 2 MG/2ML IJ SOLN
INTRAMUSCULAR | Status: AC
  Filled 2023-08-04: qty 2

## 2023-08-04 MED ORDER — FENTANYL CITRATE (PF) 100 MCG/2ML IJ SOLN
INTRAMUSCULAR | Status: DC | PRN
  Administered 2023-08-04 (×2): 25 ug via INTRAVENOUS

## 2023-08-04 MED ORDER — LOSARTAN POTASSIUM 50 MG PO TABS: 100.0000 mg | ORAL_TABLET | Freq: Every day | ORAL | Status: DC

## 2023-08-04 MED ORDER — ONDANSETRON HCL 4 MG/2ML IJ SOLN
INTRAMUSCULAR | Status: AC
  Filled 2023-08-04: qty 2

## 2023-08-04 MED ORDER — IOHEXOL 350 MG/ML SOLN
INTRAVENOUS | Status: DC | PRN
  Administered 2023-08-04: 200 mL via INTRA_ARTERIAL

## 2023-08-04 MED ORDER — VASOPRESSIN 20 UNITS/100 ML INFUSION FOR SHOCK
INTRAVENOUS | Status: AC
  Filled 2023-08-04: qty 100

## 2023-08-04 MED ORDER — SPIRONOLACTONE 12.5 MG HALF TABLET: 12.5000 mg | ORAL_TABLET | Freq: Every day | ORAL | Status: DC

## 2023-08-04 MED ORDER — MAGNESIUM SULFATE 4 GM/100ML IV SOLN
4.0000 g | Freq: Once | INTRAVENOUS | Status: AC
  Administered 2023-08-04: 4 g via INTRAVENOUS
  Filled 2023-08-04: qty 100

## 2023-08-04 MED ORDER — LOSARTAN POTASSIUM 50 MG PO TABS: 50.0000 mg | ORAL_TABLET | Freq: Every day | ORAL | Status: DC

## 2023-08-04 MED ORDER — POTASSIUM CHLORIDE CRYS ER 20 MEQ PO TBCR
40.0000 meq | EXTENDED_RELEASE_TABLET | Freq: Once | ORAL | Status: AC
  Administered 2023-08-04: 40 meq via ORAL
  Filled 2023-08-04: qty 2

## 2023-08-04 MED ORDER — HEPARIN SODIUM (PORCINE) 1000 UNIT/ML IJ SOLN
INTRAMUSCULAR | Status: DC | PRN
  Administered 2023-08-04 (×2): 5000 [IU] via INTRA_ARTERIAL
  Administered 2023-08-04 (×2): 2000 [IU] via INTRA_ARTERIAL

## 2023-08-04 MED ORDER — POTASSIUM CHLORIDE 10 MEQ/100ML IV SOLN
10.0000 meq | INTRAVENOUS | Status: DC
  Administered 2023-08-04 (×2): 10 meq via INTRAVENOUS
  Filled 2023-08-04 (×4): qty 100

## 2023-08-04 MED ORDER — NOREPINEPHRINE BITARTRATE 1 MG/ML IV SOLN
INTRAVENOUS | Status: DC | PRN
  Administered 2023-08-04: 10 ug/min via INTRAVENOUS

## 2023-08-04 MED ORDER — CANGRELOR TETRASODIUM 50 MG IV SOLR
INTRAVENOUS | Status: AC
  Filled 2023-08-04: qty 50

## 2023-08-04 MED ORDER — ONDANSETRON HCL 4 MG/2ML IJ SOLN
4.0000 mg | Freq: Four times a day (QID) | INTRAMUSCULAR | Status: DC | PRN
  Administered 2023-08-05: 4 mg via INTRAVENOUS
  Filled 2023-08-04 (×2): qty 2

## 2023-08-04 MED ORDER — LIDOCAINE HCL (PF) 1 % IJ SOLN
INTRAMUSCULAR | Status: DC | PRN
  Administered 2023-08-04: 2 mL via INTRADERMAL

## 2023-08-04 MED ORDER — HEPARIN SODIUM (PORCINE) 1000 UNIT/ML IJ SOLN
INTRAMUSCULAR | Status: AC
  Filled 2023-08-04: qty 20

## 2023-08-04 MED ORDER — FUROSEMIDE 10 MG/ML IJ SOLN
INTRAMUSCULAR | Status: DC | PRN
  Administered 2023-08-04: 10 mg via INTRAVENOUS

## 2023-08-04 MED ORDER — FENTANYL CITRATE (PF) 100 MCG/2ML IJ SOLN
INTRAMUSCULAR | Status: AC
  Filled 2023-08-04: qty 2

## 2023-08-04 MED ORDER — SODIUM CHLORIDE 0.9 % IV SOLN: 250.0000 mL | INTRAVENOUS | Status: AC | PRN

## 2023-08-04 MED ORDER — ATROPINE SULFATE 1 MG/10ML IJ SOSY
PREFILLED_SYRINGE | INTRAMUSCULAR | Status: AC
  Filled 2023-08-04: qty 10

## 2023-08-04 MED ORDER — ASPIRIN 81 MG PO CHEW
81.0000 mg | CHEWABLE_TABLET | Freq: Every day | ORAL | Status: DC
  Administered 2023-08-05 – 2023-08-11 (×7): 81 mg via ORAL
  Filled 2023-08-04 (×7): qty 1

## 2023-08-04 MED ORDER — SODIUM BICARBONATE 8.4 % IV SOLN
INTRAVENOUS | Status: AC
  Filled 2023-08-04: qty 150

## 2023-08-04 MED ORDER — SPIRONOLACTONE 25 MG PO TABS: 25.0000 mg | ORAL_TABLET | Freq: Every day | ORAL | Status: DC

## 2023-08-04 MED ORDER — HYDRALAZINE HCL 20 MG/ML IJ SOLN: 10.0000 mg | INTRAMUSCULAR | Status: DC | PRN

## 2023-08-04 MED ORDER — HEPARIN (PORCINE) IN NACL 1000-0.9 UT/500ML-% IV SOLN
INTRAVENOUS | Status: DC | PRN
  Administered 2023-08-04 (×3): 500 mL

## 2023-08-04 MED ORDER — ENOXAPARIN SODIUM 40 MG/0.4ML IJ SOSY
40.0000 mg | PREFILLED_SYRINGE | Freq: Every day | INTRAMUSCULAR | Status: DC
  Administered 2023-08-05: 40 mg via SUBCUTANEOUS
  Filled 2023-08-04: qty 0.4

## 2023-08-04 MED ORDER — VASOPRESSIN 20 UNITS/100 ML INFUSION FOR SHOCK
0.0000 [IU]/min | INTRAVENOUS | Status: DC
  Administered 2023-08-04: 0.03 [IU]/min via INTRAVENOUS

## 2023-08-04 MED ORDER — VERAPAMIL HCL 2.5 MG/ML IV SOLN
INTRAVENOUS | Status: DC | PRN
  Administered 2023-08-04: 10 mL via INTRA_ARTERIAL

## 2023-08-04 MED ORDER — EMPAGLIFLOZIN 10 MG PO TABS: 10.0000 mg | ORAL_TABLET | Freq: Every day | ORAL | Status: DC

## 2023-08-04 MED ORDER — LABETALOL HCL 5 MG/ML IV SOLN: 10.0000 mg | INTRAVENOUS | Status: DC | PRN

## 2023-08-04 MED ORDER — DOPAMINE-DEXTROSE 3.2-5 MG/ML-% IV SOLN
0.0000 ug/kg/min | INTRAVENOUS | Status: DC
  Administered 2023-08-04: 3 ug/kg/min via INTRAVENOUS
  Filled 2023-08-04: qty 250

## 2023-08-04 SURGICAL SUPPLY — 36 items
BALL SAPPHIRE NC24 3.5X8 (BALLOONS) ×1 IMPLANT
BALLN EMERGE MR 2.0X12 (BALLOONS) ×1 IMPLANT
BALLN EMERGE MR 2.5X12 (BALLOONS) ×1 IMPLANT
BALLN EMERGE MR 3.0X12 (BALLOONS) ×1 IMPLANT
BALLN ~~LOC~~ EMERGE MR 3.25X8 (BALLOONS) ×1 IMPLANT
BALLN ~~LOC~~ EMERGE MR 3.5X12 (BALLOONS) ×1 IMPLANT
BALLOON EMERGE MR 2.0X12 (BALLOONS) IMPLANT
BALLOON EMERGE MR 2.5X12 (BALLOONS) IMPLANT
BALLOON EMERGE MR 3.0X12 (BALLOONS) IMPLANT
BALLOON SAPPHIRE NC24 3.5X8 (BALLOONS) IMPLANT
BALLOON ~~LOC~~ EMERGE MR 3.25X8 (BALLOONS) IMPLANT
BALLOON ~~LOC~~ EMERGE MR 3.5X12 (BALLOONS) IMPLANT
CATH GUIDELINER COAST (CATHETERS) IMPLANT
CATH INFINITI 5FR ANG PIGTAIL (CATHETERS) IMPLANT
CATH INFINITI 6F FL3.5 (CATHETERS) IMPLANT
CATH LAUNCHER 6FR AL.75 (CATHETERS) IMPLANT
CATH LAUNCHER 6FR JR4 (CATHETERS) IMPLANT
CATH VISTA GUIDE 6FR AL1 (CATHETERS) ×1
CATH VISTA GUIDE 6FR AL1 MULPK (CATHETERS) IMPLANT
DEVICE RAD COMP TR BAND LRG (VASCULAR PRODUCTS) IMPLANT
GLIDESHEATH SLEND SS 6F .021 (SHEATH) IMPLANT
GUIDEWIRE ANGLED .035X150CM (WIRE) IMPLANT
GUIDEWIRE VAS SION BLUE 190 (WIRE) IMPLANT
KIT ENCORE 26 ADVANTAGE (KITS) IMPLANT
KIT HEMO VALVE WATCHDOG (MISCELLANEOUS) IMPLANT
PACK CARDIAC CATHETERIZATION (CUSTOM PROCEDURE TRAY) ×1 IMPLANT
SET ATX-X65L (MISCELLANEOUS) IMPLANT
SHEATH 6FR 85 DEST SLENDER (SHEATH) IMPLANT
SHEATH PROBE COVER 6X72 (BAG) IMPLANT
STENT SYNERGY XD 3.0X12 (Permanent Stent) IMPLANT
STENT SYS SYNERGY XD 3.0X12 (Permanent Stent) ×3 IMPLANT
SYNERGY XD 3.0X12 (Permanent Stent) ×3 IMPLANT
WIRE EMERALD 3MM-J .035X260CM (WIRE) IMPLANT
WIRE HI TORQ VERSACORE-J 145CM (WIRE) IMPLANT
WIRE HI TORQ WHISPER MS 190CM (WIRE) IMPLANT
WIRE RUNTHROUGH IZANAI 014 180 (WIRE) IMPLANT

## 2023-08-04 NOTE — Progress Notes (Addendum)
Advanced Heart Failure Progress Update  Called to bedside by RN d/t concern for confusion. Patient oriented to self, place and time. Disoriented to situation. Patient able to follow commands and name items at bedside. Moving all extremities bilaterally with equal strength. Hard of hearing.   Having intermittent nausea and retching. Zofran given.   On Dopamine 3 for bradycardia and hypotension. HR 70-80s. BP 140/60s.  Once PICC line vs. Central line placed would like to get Co-ox to evaluate CO/CI. Low output could be contributing to symptoms.   Kevin Lee, NP 08/04/23 6:30 PM  Patient seen with NP, agree with the above note.   Patient now wakes up and answers questions appropriately.  No focal neurological symptoms.  I do not think this is a stroke, suspect some waxing/waning with delirium and very hard of hearing. May have some baseline mild dementia.   I stopped dopamine.  HR occasionally dips into 40s (sinus bradycardia) but not truly hypotensive.  No chest pain or dyspnea.  I suspect this will improve with time post-inferior STEMI with successful PCI.  Can use dopamine if needed but I do not think he needs at this time.  He needs CPAP while sleeping.    Kevin Frank 08/04/2023 6:55 PM

## 2023-08-04 NOTE — Telephone Encounter (Signed)
Patient Product/process development scientist completed.    The patient is insured through [employer]. Patient has Medicare and is not eligible for a copay card, but may be able to apply for patient assistance or Medicare RX Payment Plan (Patient Must reach out to their plan, if eligible for payment plan), if available.    Ran test claim for Brilinta 90 mg and the current 30 day co-pay is $297.00 due to a $250.00 deductible.  Will be $47.00 once deductible is met.   This test claim was processed through [facility]- copay amounts may vary at other pharmacies due to pharmacy/plan contracts, or as the patient moves through the different stages of their insurance plan.     Roland Earl, CPHT Pharmacy Technician III Certified Patient Advocate [facility] Patient Advocate Team Direct Number: [phone]  Fax: [phone]

## 2023-08-04 NOTE — H&P (Addendum)
 Advanced Heart Failure Team History and Physical Note   PCP:  Elfredia Nevins, MD  PCP-Cardiology: Dina Rich, MD     Reason for Admission: STEMI  HPI:    Kevin Frank is a 83 yo male with past medical history of chronic diastolic heart failure, hypertension, hyperlipidemia, CAD, type 2 DM, AAA s/p repair, lung cancer, COPD, and OSA on 5L and home CPAP.  Last echo 5/21 showed EF 60-65%, G1DD  Has followed with Cardiology since 2022. Most recently seen by Dr. Wyline Mood 10/24. At that time no chest pain or HF symptoms.   Recently admitted to [facility] for sepsis and hypoxia secondary to PNA 05/26/23. He was treated with broad spectrum antibiotics and discharge on Levaquin 7 day course.  Presented to the ED by EMS with STEMI and was emergently taken to cath lab. Vitals signs were HR 47, SBP in the 60s, SpO2 83%. EKG + for STEMI with ST elevations in inferior leads and ST depression in anterior leads. Labs notable for K 2.8, BUN/Cr 12/1.29, AST 44, hs-trop 1030, LA 2.9.  Home Medications Prior to Admission medications   Medication Sig Start Date End Date Taking? Authorizing Provider  albuterol (PROVENTIL) (2.5 MG/3ML) 0.083% nebulizer solution Take 3 mLs (2.5 mg total) by nebulization every 6 (six) hours as needed for wheezing or shortness of breath. 06/25/22   Alfonse Spruce, MD  albuterol (VENTOLIN HFA) 108 [phone] Base) MCG/ACT inhaler Use 4 puffs every 4-6 hours as needed for cough or wheeze 05/01/23   Alfonse Spruce, MD  allopurinol (ZYLOPRIM) 300 MG tablet Take 1 tablet (300 mg total) by mouth daily. 09/07/19   Corum, Minerva Fester, MD  amLODipine (NORVASC) 10 MG tablet Take 1 tablet (10 mg total) by mouth daily. 09/06/19   Rosalio Macadamia, NP  atorvastatin (LIPITOR) 40 MG tablet Take 1 tablet (40 mg total) by mouth daily. 04/22/23   Antoine Poche, MD  clopidogrel (PLAVIX) 75 MG tablet Take 1 tablet (75 mg total) by mouth daily. 09/07/19   Corum, Minerva Fester, MD  Cobalamin Combinations  (NEURIVA PLUS) CAPS Take 1 tablet by mouth daily.    [provider]  EPINEPHrine 0.3 mg/0.3 mL IJ SOAJ injection Inject 0.3 mg into the muscle as needed for anaphylaxis. 01/09/23   Alfonse Spruce, MD  fluticasone-salmeterol [facility] INHUB) 500-50 MCG/ACT AEPB Inhale 1 puff into the lungs in the morning and at bedtime. 05/05/23   Alfonse Spruce, MD  hydrALAZINE (APRESOLINE) 100 MG tablet Take 1 tablet (100 mg total) by mouth 2 (two) times daily. 04/22/23   Antoine Poche, MD  JARDIANCE 10 MG TABS tablet Take 10 mg by mouth daily. 04/22/21   [provider]  losartan (COZAAR) 100 MG tablet TAKE 1 TABLET BY MOUTH ONCE DAILY. 10/10/19   Corum, Minerva Fester, MD  metFORMIN (GLUCOPHAGE) 500 MG tablet Take 1,000 mg by mouth 2 (two) times daily. 01/29/23   [provider]  metoprolol succinate (TOPROL-XL) 25 MG 24 hr tablet Take 1 tablet (25 mg total) by mouth daily. 09/06/19   Rosalio Macadamia, NP  sertraline (ZOLOFT) 50 MG tablet Take 1 tablet (50 mg total) by mouth daily. 08/29/19   Corum, Minerva Fester, MD  Tezepelumab-ekko (TEZSPIRE) 210 MG/1. SOAJ Inject 210 mg into the skin every 28 (twenty-eight) days. 07/28/23   Alfonse Spruce, MD  torsemide (DEMADEX) 20 MG tablet Take 1 tablet (20 mg total) by mouth daily. 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    Normal nuclear stress test 2011   Obesity    Obstructive sleep apnea on CPAP    Presence of indwelling urinary catheter     Shortness of breath    due to cigarette abuse   Tobacco abuse    Past Surgical History: Past Surgical History:  Procedure Laterality Date   ABDOMINAL AORTIC ENDOVASCULAR FENESTRATED STENT GRAFT N/A 04/09/2016   Procedure: ABDOMINAL AORTIC ENDOVASCULAR FENESTRATED STENT GRAFT with left brachial artery access using ultrasound guidance;  Surgeon: Nada Libman, MD;  Location: [facility] OR;  Service: Vascular;  Laterality: N/A;   CHOLECYSTECTOMY  1997   [facility]   COLONOSCOPY  2006   2006: multiple diminutive polyps in rectum, one pedunculated polyp at 10 cm s/p snare removal. Left-sided diverticulum, pedunculated polyps at 35 cm and splenic flexure s/p snare removal. No path available.    COLONOSCOPY WITH PROPOFOL N/A 11/24/2019   Procedure: COLONOSCOPY WITH PROPOFOL;  Surgeon: Corbin Ade, MD;  Location: [facility] ENDO SUITE;  Service: Endoscopy;  Laterality: N/A;  9:45am   PERIPHERAL VASCULAR CATHETERIZATION N/A 04/10/2016   Procedure: Renal Angiography;  Surgeon: Chuck Hint, MD;  Location: [facility] INVASIVE CV LAB;  Service: Cardiovascular;  Laterality: N/A;   POLYPECTOMY  11/24/2019   Procedure: POLYPECTOMY;  Surgeon: Corbin Ade, MD;  Location: [facility] ENDO SUITE;  Service: Endoscopy;;   TRANSURETHRAL RESECTION OF PROSTATE  01/07/2011   Procedure: TRANSURETHRAL RESECTION OF THE PROSTATE (TURP);  Surgeon: Ky Barban;  Location: [facility] ORS;  Service: Urology;  Laterality: N/A;   Family History:  Family History  Problem Relation Age of Onset   Liver disease Father    Lymphoma Mother    Diabetes Mother    Hypertension Mother    Cancer Mother    Mental retardation Other        sibling -- from birth    Asthma Daughter    Allergic rhinitis Neg Hx    Angioedema Neg Hx    Atopy Neg Hx    Eczema Neg Hx    Immunodeficiency Neg Hx    Colon cancer Neg Hx    Social History: Social History   Socioeconomic History   Marital status: Married    Spouse name: Okey Regal   Number of children: 2   Years  of education: Not on file   Highest education level: Not on file  Occupational History   Not on file  Tobacco Use   Smoking status: Every Day    Current packs/day: 2.00    Average packs/day: 2.0 packs/day for 50.0 years (100.0 ttl pk-yrs)    Types: Cigarettes    Passive exposure: Never   Smokeless tobacco: Never   Tobacco comments:    Smokes 2 packs a day [facility] 10/17/2021   Vaping Use   Vaping status: Never Used  Substance and Sexual Activity   Alcohol use:  Yes    Alcohol/week: 0.0 standard drinks of alcohol    Comment: very little   Drug use: No   Sexual activity: Not on file  Other Topics Concern   Not on file  Social History Narrative   Lives at home with wife   Social Drivers of Health   Financial Resource Strain: Low Risk  (03/05/2022)   Overall Financial Resource Strain (CARDIA)    Difficulty of Paying Living Expenses: Not hard at all  Food Insecurity: No Food Insecurity (05/29/2023)   [facility]   Hunger Vital Sign    Worried About Running Out of Food in the Last Year: Never true    Ran Out of Food in the Last Year: Never true  Transportation Needs: No Transportation Needs (03/05/2022)   PRAPARE - Administrator, Civil Service (Medical): No    Lack of Transportation (Non-Medical): No  Physical Activity: Not on file  Stress: Not on file  Social Connections: Not on file    Allergies:  Allergies  Allergen Reactions   No Known Allergies     Objective:    Vital Signs:   Weight:  [99.8 kg] 99.8 kg (02/11 1038)   Filed Weights   08/04/23 1038  Weight: 99.8 kg   Physical Exam     General:  Well appearing. No respiratory difficulty HEENT: Normal Neck: Supple. no JVD. Carotids 2+ bilat; no bruits. No lymphadenopathy or thyromegaly appreciated. Cor: PMI nondisplaced. Regular rate & rhythm. No rubs, gallops or murmurs. Lungs: Clear Abdomen: Soft, nontender, nondistended. No hepatosplenomegaly. No bruits or masses. Good bowel  sounds. Extremities: No cyanosis, clubbing, rash, edema Neuro: Alert & oriented x 3, cranial nerves grossly intact. moves all 4 extremities w/o difficulty. Affect pleasant.  Telemetry   SR in 90s (personally reviewed)  EKG   Post cath: NSR 91 bpm with ST elevation in inferior leads and depression in anterior leads (personally reviewed)  Labs   Basic Metabolic Panel: Recent Labs  Lab 08/04/23 1050 08/04/23 1051 08/04/23 1108 08/04/23 1110  NA 136 136 138 140  K 3.3* 3.3* 2.8* 2.8*  CL  --  102 97*  --   CO2  --   --  27  --   GLUCOSE  --  270* 268*  --   BUN  --  13 12  --   CREATININE  --  1.30* 1.29*  --   CALCIUM  --   --  8.2*  --    Liver Function Tests: Recent Labs  Lab 08/04/23 1108  AST 44*  ALT 26  ALKPHOS 67  BILITOT 0.8  PROT 5.6*  ALBUMIN 2.9*   No results for input(s): "LIPASE", "AMYLASE" in the last 168 hours. No results for input(s): "AMMONIA" in the last 168 hours.  CBC: Recent Labs  Lab 08/04/23 1050 08/04/23 1051 08/04/23 1108 08/04/23 1110  WBC  --   --  15.5*  --   NEUTROABS  --   --  12.1*  --   HGB 13.6 13.9 13.4 13.6  HCT 40.0 41.0 38.4* 40.0  MCV  --   --  90.4  --   PLT  --   --  193  --    Cardiac Enzymes: No results for input(s): "CKTOTAL", "CKMB", "CKMBINDEX", "TROPONINI" in the last 168 hours.  BNP: BNP (last 3 results) No results for input(s): "BNP" in the last 8760 hours.  ProBNP (last 3 results) No results for input(s): "PROBNP" in the  last 8760 hours.  CBG: No results for input(s): "GLUCAP" in the last 168 hours.  Coagulation Studies: Recent Labs    08/04/23 1108  LABPROT 16.5*  INR 1.3*   Imaging: CARDIAC CATHETERIZATION Result Date: 08/04/2023   Prox RCA to Mid RCA lesion is 99% stenosed.   A stent was successfully placed.   Post intervention, there is a 0% residual stenosis. 1.  99% mid right coronary artery lesion requiring complex PCI with multiple wires and GuideLiner with 3 overlapping drug-eluting  stents.  The case was complicated by transient need for norepinephrine and vasopressin.  The patient received bicarbonate and Lasix 10 mg IV during the procedure.  All pressors were weaned at the conclusion of the procedure. 2.  Mild left-sided disease. 3.  Significant upper extremity tortuosity requiring destination sheath. 4.  LVEDP of 25 mmHg at the conclusion of the procedure and after Lasix had and administered. 5.  Due to inability to take p.o. during the procedure, the patient was treated with a cangrelor drip.  Transition to [facility] will be managed by pharmacy. Recommendation: Dual antiplatelet therapy for 1 year and aggressive treatment of cardiovascular risk factors.    Patient Profile   Kevin Frank is a 83 yo male with past medical history of chronic diastolic heart failure, hypertension, hyperlipidemia, CAD, type 2 DM, AAA s/p repair, and OSA on home CPAP.  Assessment/Plan   STEMI CAD - Presented by EMS with EKG + STEMI with ST elevation in inferior leads. With hypotension SBP 60s, bradycardia 40s, and lactic acidosis. Taken emergently to cath lab.  - LHC with 99% occlusion of RCA s/p PCI with overlapping DES x3 to RCA. Mild left sided disease. - Cangrelor will be transitioned to ticagrelor.  - DAPT: ticegrelor + asa for 1 year - continue atorva 80 mg daily  2. HFpEF - Previously HFpEF. Last echo 5/21 showed EF 60-65%, G1DD - With STEMI, concerned for newly reduced EF - Repeat echo pending - LVEDP 25 at end of cath, given Lasix 10 mg with good response - continue losartan 100 mg daily - start Spiro 25 mg daily - resume torsemide 20 mg daily tomorrow - resume jardiance 10 mg daily tomorrow - resume toprol-XL 25 mg daily today if repeat LA <2.  3. AKI  - in the setting of STEMI - Cr 1.3 on admit. CTM BMP - diuresis as above  4. Chronic Hypoxic Respiratory Failure COPD/Severe Persistent Asthma/OSA Lung Cancer - treated with radiation in 2019. Now s/p 5 year  surveillance with normal CT.  - follows with Oncology. Annual CTs - still smoking - nebs per primary - On 5L [place] at home and CPAP at night  5. HTN - hold hydralazine and norvasc while repeat lactic acid pending - GDMT as above - continue to monitor BP  6. Hypokalemia - K 2.8 - Aggressive repletion for goal >4 - start spiro  7. Current Smoker: 2 ppd  8. AAA: s/p repair in June 2024   [place] Lee, NP 08/04/2023, 1:28 PM  Advanced Heart Failure Team Pager [phone] (M-F; 7a - 5p)  Please contact [facility] for night-coverage after hours (4p -7a ) and weekends on [email or web address]  Patient seen with NP, I formulated the plan and agree with the above note.   Patient presented today with inferior STEMI.  He has a history of lung cancer s/p radiation, HFpEF, asthma/COPD overlap, AAA repair, and OSA.  Patient was initially bradycardic with HR in 40s (appears sinus), SBP 60s.  Inferior  ST elevation.  Required norepinephrine and vasopressin transiently. Initial lactate 2.9.  He had a 99% stenosed RCA with DES x 3 to open with good result.  LVEDP 25, received Lasix 10 mg IV with about 1000 cc urine output. Currently feels much better, no chest pain, no dyspnea.   He has had some post-procedural sinus bradycardia to 40s, transient with no hypotension.   General: NAD Neck: JVP 8 cm, no thyromegaly or thyroid nodule.  Lungs: Clear to auscultation bilaterally with normal respiratory effort. CV: Nondisplaced PMI.  Heart regular S1/S2, no S3/S4, no murmur.  No peripheral edema.  No carotid bruit.  Normal pedal pulses.  Abdomen: Soft, nontender, no hepatosplenomegaly, no distention.  Skin: Intact without lesions or rashes.  Neurologic: Alert and oriented x 3.  Psych: Normal affect. Extremities: No clubbing or cyanosis.  HEENT: Normal.    1. CAD: Inferior STEMI, initially complicated by bradycardia/hypotension now resolved.  99% stenosis proximal RCA, minimal disease in other vessels.  DES x 3 to  proximal RCA, good result.  No chest pain currently.  - Transitioning from cangrelor to ticagrelor + ASA 81.  - Atorvastatin 80.  2. H/o HFpEF: Prior EF normal by echo in 5/21.  On torsemide at home. Suspect EF will be at least mildly reduced. Excellent response to Lasix 10 mg IV in cath lab, 1000 cc UOP.  Volume status currently looks ok.  - Echo today.  - Replace K. Can add spironolactone 12.5 daily tomorrow if stable.  - Hold amlodipine, hydralazine, losartan for now with bradycardic episodes.   - Likely resume his home torsemide tomorrow.  - Hold off on metoprolol with bradycardia.  3. COPD with asthma: On home oxygen, active smoking   I strongly encouraged him to quit smoking.  4. H/o lung cancer: S/p radiation.  5. OSA: Continue CPAP.  6. Bradycardia: Transient sinus bradycardia on telemetry, HR dropping to 40s briefly at times (currently while asleep).  No hypotension. Think this is a result of inferior MI, hopefully this will improve with time post-PCI.  - Keep atropine at bedside.  - Hold beta blockade.  - Can use dopamine if any prolonged bradycardia.   Marca Ancona 08/04/2023 3:02 PM

## 2023-08-04 NOTE — TOC Initial Note (Signed)
Transition of Care [facility]) - Initial/Assessment Note    Patient Details  Name: Kevin Frank MRN: [id number] Date of Birth: 02-13-1941  [facility] CM/SW Contact:    Nicanor Bake Phone Number: [phone] 08/04/2023, 3:49 PM  Clinical Narrative:   3:08 PM- HF CSW attempted to meet with pt at bedside. Pt was being seen by [facility] staff. CSW will follow up with pt at a more appropriate time.   TOC will continue following.                       Patient Goals and CMS Choice            Expected Discharge Plan and Services                                              Prior Living Arrangements/Services                       Activities of Daily Living      Permission Sought/Granted                  Emotional Assessment              Admission diagnosis:  STEMI (ST elevation myocardial infarction) (HCC) [I21.3] Patient Active Problem List   Diagnosis Date Noted   STEMI (ST elevation myocardial infarction) (HCC) 08/04/2023   Chronic gouty arthritis 07/14/2022   Polyarthralgia 07/14/2022   Coronary artery calcification 12/09/2021   Asthma exacerbation in COPD (HCC) 08/18/2021   Cough 07/18/2021   Asthma with COPD with exacerbation (HCC) 07/18/2021   Lobar pneumonia (HCC) 12/11/2020   Acute respiratory failure (HCC) 12/10/2020   Wrist pain, acute, right 09/07/2019   Right forearm pain 09/07/2019   Uncontrolled type 2 diabetes mellitus with hyperglycemia (HCC) 06/27/2019   Mixed hyperlipidemia 06/27/2019   Class 1 obesity due to excess calories with serious comorbidity and body mass index (BMI) of 32.0 to 32.9 in adult 06/27/2019   Diabetes mellitus without complication (HCC) 06/22/2019   Positive colorectal cancer screening using Cologuard test 06/14/2019   Squamous cell carcinoma of right lung (HCC) 03/29/2018   Seasonal and perennial allergic rhinitis 09/22/2017   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    Obstructive sleep apnea on CPAP 04/23/2017   Chronic knee pain 04/23/2017   AKI (acute kidney injury) (HCC) 04/23/2017   Chronic heart failure with preserved ejection fraction (HCC) 04/23/2017   Cellulitis 04/23/2017   Cellulitis of left lower leg    Current smoker 03/05/2016   Incidental lung nodule, > 3mm and < 8mm 03/05/2016   Stage 2 moderate COPD by GOLD classification (HCC) 03/05/2016   AAA (abdominal aortic aneurysm) without rupture (HCC) 06/14/2015   Tobacco use 06/09/2007   OBSTRUCTIVE SLEEP APNEA 06/09/2007   HYPERCHOLESTEROLEMIA 05/06/2007   GOUT 05/06/2007   DEPRESSION 05/06/2007   Essential hypertension 05/06/2007   Aneurysm of abdominal vessel (HCC) 05/06/2007   BENIGN PROSTATIC HYPERTROPHY, HX OF 05/06/2007   PCP:  Elfredia Nevins, MD Pharmacy:   [facility] - [place], [place] - [address] [place] [address] Phone: [phone] Fax: [phone]  [facility] - [place], [place] - [address] [place] [address] Phone: [phone] Fax: [phone]  [facility] - [place], [place] - [address] [place] [address] Phone: [phone] Fax: [phone]  [facility] [address] [place] [address] Phone: [phone] Fax: [phone]     Social Drivers of Health (SDOH) Social History: SDOH Screenings   Food Insecurity: No Food Insecurity (05/29/2023)   Received from [facility]  Housing: Low Risk  (03/05/2022)  Transportation Needs: No Transportation Needs (03/05/2022)  Utilities: Not At Risk (03/05/2022)  Depression (PHQ2-9): Low Risk  (09/07/2019)  Financial Resource Strain: Low Risk  (03/05/2022)  Tobacco Use: High Risk (05/28/2023)   Received from [facility]   SDOH Interventions:     Readmission Risk Interventions     No data to display

## 2023-08-04 NOTE — Progress Notes (Signed)
   08/04/23 2130  BiPAP/CPAP/SIPAP  $ Non-Invasive Home Ventilator  Initial  $ Face Mask Large  Yes  BiPAP/CPAP/SIPAP Pt Type Adult  BiPAP/CPAP/SIPAP Resmed  Mask Type Full face mask  Mask Size Large  Respiratory Rate 18 breaths/min  EPAP  (6,18)  Flow Rate 1.5 lpm  Patient Home Equipment No  Auto Titrate Yes

## 2023-08-05 ENCOUNTER — Telehealth (HOSPITAL_COMMUNITY): Payer: Self-pay | Admitting: Pharmacy Technician

## 2023-08-05 ENCOUNTER — Inpatient Hospital Stay (HOSPITAL_COMMUNITY): Payer: Medicare HMO

## 2023-08-05 ENCOUNTER — Encounter (HOSPITAL_COMMUNITY): Payer: Self-pay | Admitting: Internal Medicine

## 2023-08-05 ENCOUNTER — Other Ambulatory Visit (HOSPITAL_COMMUNITY): Payer: Self-pay

## 2023-08-05 DIAGNOSIS — I213 ST elevation (STEMI) myocardial infarction of unspecified site: Secondary | ICD-10-CM | POA: Diagnosis not present

## 2023-08-05 DIAGNOSIS — I2510 Atherosclerotic heart disease of native coronary artery without angina pectoris: Secondary | ICD-10-CM

## 2023-08-05 DIAGNOSIS — I5031 Acute diastolic (congestive) heart failure: Secondary | ICD-10-CM

## 2023-08-05 LAB — CBC
HCT: 41.1 % (ref 39.0–52.0)
Hemoglobin: 13.9 g/dL (ref 13.0–17.0)
MCH: 31.2 pg (ref 26.0–34.0)
MCHC: 33.8 g/dL (ref 30.0–36.0)
MCV: 92.2 fL (ref 80.0–100.0)
Platelets: 188 10*3/uL (ref 150–400)
RBC: 4.46 MIL/uL (ref 4.22–5.81)
RDW: 13.8 % (ref 11.5–15.5)
WBC: 14.2 10*3/uL — ABNORMAL HIGH (ref 4.0–10.5)
nRBC: 0 % (ref 0.0–0.2)

## 2023-08-05 LAB — BASIC METABOLIC PANEL
Anion gap: 13 (ref 5–15)
BUN: 15 mg/dL (ref 8–23)
CO2: 25 mmol/L (ref 22–32)
Calcium: 9.1 mg/dL (ref 8.9–10.3)
Chloride: 102 mmol/L (ref 98–111)
Creatinine, Ser: 1.3 mg/dL — ABNORMAL HIGH (ref 0.61–1.24)
GFR, Estimated: 55 mL/min — ABNORMAL LOW (ref >60)
Glucose, Bld: 178 mg/dL — ABNORMAL HIGH (ref 70–99)
Potassium: 5.2 mmol/L — ABNORMAL HIGH (ref 3.5–5.1)
Sodium: 140 mmol/L (ref 135–145)

## 2023-08-05 LAB — ECHOCARDIOGRAM COMPLETE
Area-P 1/2: 2.74 cm2
S' Lateral: 3.8 cm
Weight: 3520.31 [oz_av]

## 2023-08-05 MED ORDER — EMPAGLIFLOZIN 10 MG PO TABS
10.0000 mg | ORAL_TABLET | Freq: Every day | ORAL | Status: DC
  Administered 2023-08-06 – 2023-08-07 (×2): 10 mg via ORAL
  Filled 2023-08-05 (×2): qty 1

## 2023-08-05 MED ORDER — DAPAGLIFLOZIN PROPANEDIOL 10 MG PO TABS
10.0000 mg | ORAL_TABLET | Freq: Every day | ORAL | Status: DC
  Administered 2023-08-05: 10 mg via ORAL
  Filled 2023-08-05: qty 1

## 2023-08-05 MED ORDER — FUROSEMIDE 10 MG/ML IJ SOLN
40.0000 mg | Freq: Once | INTRAMUSCULAR | Status: AC
  Administered 2023-08-05: 40 mg via INTRAVENOUS
  Filled 2023-08-05: qty 4

## 2023-08-05 MED ORDER — EMPAGLIFLOZIN 10 MG PO TABS: 10.0000 mg | ORAL_TABLET | Freq: Every day | ORAL | Status: DC

## 2023-08-05 MED ORDER — CLOPIDOGREL BISULFATE 75 MG PO TABS
75.0000 mg | ORAL_TABLET | Freq: Every day | ORAL | Status: DC
  Administered 2023-08-06 – 2023-08-11 (×6): 75 mg via ORAL
  Filled 2023-08-05 (×6): qty 1

## 2023-08-05 MED ORDER — PERFLUTREN LIPID MICROSPHERE
1.0000 mL | INTRAVENOUS | Status: AC | PRN
Start: 2023-08-05 — End: 2023-08-05
  Administered 2023-08-05: 6 mL via INTRAVENOUS

## 2023-08-05 MED ORDER — NICOTINE 21 MG/24HR TD PT24
21.0000 mg | MEDICATED_PATCH | Freq: Every day | TRANSDERMAL | Status: DC
  Administered 2023-08-07 – 2023-08-11 (×5): 21 mg via TRANSDERMAL
  Filled 2023-08-05 (×7): qty 1

## 2023-08-05 MED ORDER — LOSARTAN POTASSIUM 25 MG PO TABS: 25.0000 mg | ORAL_TABLET | Freq: Every day | ORAL | Status: DC

## 2023-08-05 MED ORDER — CLOPIDOGREL BISULFATE 300 MG PO TABS
600.0000 mg | ORAL_TABLET | Freq: Once | ORAL | Status: AC
  Administered 2023-08-05: 600 mg via ORAL
  Filled 2023-08-05: qty 2

## 2023-08-05 MED ORDER — CHLORHEXIDINE GLUCONATE CLOTH 2 % EX PADS
6.0000 | MEDICATED_PAD | Freq: Every day | CUTANEOUS | Status: DC
  Administered 2023-08-05 – 2023-08-11 (×6): 6 via TOPICAL

## 2023-08-05 NOTE — Progress Notes (Signed)
Echocardiogram 2D Echocardiogram has been performed.  Warren Lacy Lacretia Tindall RDCS 08/05/2023, 8:18 AM

## 2023-08-05 NOTE — TOC Initial Note (Addendum)
Transition of Care [facility]) - Initial/Assessment Note    Patient Details  Name: Kevin Frank MRN: [id number] Date of Birth: Mar 16, 1941  Transition [facility] Care [facility]) CM/SW Contact:    Nicanor Bake Phone Number: [phone] 08/05/2023, 2:54 PM  Clinical Narrative:    HF CSW met with pt, pts wife, and son at bedside. Pt and wife stated it is just them two living in the home. Pt stated that he has no history of HH services. Pt and wife stated that he uses CPAP and oxygen. Pt has a scale at home. Pt stated that he has a PCP. CSW explained that a hospital follow up appointment will be scheduled closer to dc.   TOC will continue following.                Expected Discharge Plan: Home/Self Care Barriers to Discharge: Continued Medical Work up   Patient Goals and CMS Choice            Expected Discharge Plan and Services       Living arrangements for the past 2 months: Single Family Home                                      Prior Living Arrangements/Services Living arrangements for the past 2 months: Single Family Home Lives with:: Spouse Patient language and need for interpreter reviewed:: Yes Do you feel safe going back to the place where you live?: Yes      Need for Family Participation in Patient Care: No (Comment)     Criminal Activity/Legal Involvement Pertinent to Current Situation/Hospitalization: No - Comment as needed  Activities of Daily Living      Permission Sought/Granted                  Emotional Assessment Appearance:: Appears older than stated age Attitude/Demeanor/Rapport: Inconsistent Affect (typically observed): Calm Orientation: : Oriented to Self, Oriented to Place, Oriented to  Time, Oriented to Situation Alcohol / Substance Use: Not Applicable Psych Involvement: No (comment)  Admission diagnosis:  STEMI (ST elevation myocardial infarction) (HCC) [I21.3] Patient Active Problem List   Diagnosis Date Noted   STEMI (ST  elevation myocardial infarction) (HCC) 08/04/2023   Chronic gouty arthritis 07/14/2022   Polyarthralgia 07/14/2022   Coronary artery calcification 12/09/2021   Asthma exacerbation in COPD (HCC) 08/18/2021   Cough 07/18/2021   Asthma with COPD with exacerbation (HCC) 07/18/2021   Lobar pneumonia (HCC) 12/11/2020   Acute respiratory failure (HCC) 12/10/2020   Wrist pain, acute, right 09/07/2019   Right forearm pain 09/07/2019   Uncontrolled type 2 diabetes mellitus with hyperglycemia (HCC) 06/27/2019   Mixed hyperlipidemia 06/27/2019   Class 1 obesity due to excess calories with serious comorbidity and body mass index (BMI) of 32.0 to 32.9 in adult 06/27/2019   Diabetes mellitus without complication (HCC) 06/22/2019   Positive colorectal cancer screening using Cologuard test 06/14/2019   Squamous cell carcinoma of right lung (HCC) 03/29/2018   Seasonal and perennial allergic rhinitis 09/22/2017   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   Obstructive sleep apnea on CPAP 04/23/2017   Chronic knee pain 04/23/2017   AKI (acute kidney injury) (HCC) 04/23/2017   Chronic heart failure with preserved ejection fraction (HCC) 04/23/2017   Cellulitis 04/23/2017   Cellulitis of left lower leg    Current smoker 03/05/2016   Incidental lung nodule, > 3mm and < 8mm  03/05/2016   Stage 2 moderate COPD by GOLD classification (HCC) 03/05/2016   AAA (abdominal aortic aneurysm) without rupture (HCC) 06/14/2015   Tobacco use 06/09/2007   OBSTRUCTIVE SLEEP APNEA 06/09/2007   HYPERCHOLESTEROLEMIA 05/06/2007   GOUT 05/06/2007   DEPRESSION 05/06/2007   Essential hypertension 05/06/2007   Aneurysm of abdominal vessel (HCC) 05/06/2007   BENIGN PROSTATIC HYPERTROPHY, HX OF 05/06/2007   PCP:  Elfredia Nevins, MD Pharmacy:   [facility] - [place], [place] - [address] [place] [address] Phone: [phone] Fax: [phone]  [facility] - [place], [place] - [address] [place] [address] Phone: [phone] Fax: [phone]  [name] - [place], [place] - [address] [place] [address] Phone: [phone] Fax: [phone]  [facility] [address] [address] Phone: [phone] Fax: [phone]     Social Drivers of Health (SDOH) Social History: SDOH Screenings   Food Insecurity: No Food Insecurity (08/05/2023)  Housing: Low Risk  (08/05/2023)  Transportation Needs: No Transportation Needs (08/05/2023)  Utilities: Not At Risk (08/05/2023)  Depression (PHQ2-9): Low Risk  (09/07/2019)  Financial Resource Strain: Low Risk  (03/05/2022)  Social Connections: Socially Integrated (08/05/2023)  Tobacco Use: High Risk (05/28/2023)   Received from [facility]   SDOH Interventions: Food Insecurity Interventions: Intervention Not Indicated Housing Interventions: Intervention Not Indicated Transportation Interventions: Intervention Not Indicated Utilities Interventions: Intervention Not Indicated Social Connections Interventions: Intervention Not Indicated   Readmission Risk Interventions     No data to display

## 2023-08-05 NOTE — Progress Notes (Addendum)
Advanced Heart Failure Rounding Note  Cardiologist: Dina Rich, MD   Chief Complaint: Inferior STEMI  Subjective:    Bradycardia improved. SR 80s, intermittently dips to 50s.  No chest pain or shortness of breath.    Objective:   Weight Range: 99.8 kg Body mass index is 31.57 kg/m.   Vital Signs:   Temp:  [97.7 F (36.5 C)-97.9 F (36.6 C)] 97.8 F (36.6 C) (02/12 0355) Pulse Rate:  [49-99] 52 (02/12 1100) Resp:  [12-29] 19 (02/12 1100) BP: (78-149)/(51-101) 104/57 (02/12 1100) SpO2:  [89 %-99 %] 95 % (02/12 1100) Last BM Date :  (PTA)  Weight change: Filed Weights   08/04/23 1038  Weight: 99.8 kg    Intake/Output:   Intake/Output Summary (Last 24 hours) at 08/05/2023 1155 Last data filed at 08/05/2023 1324 Gross per 24 hour  Intake 551.94 ml  Output 4125 ml  Net -3573.06 ml      Physical Exam    General:  Well appearing elderly male HEENT: Hard of hearing Neck: JVP 8-10 Cor: Regular rate & rhythm. No rubs, gallops or murmurs. Lungs: Clear Abdomen: Soft, nontender, nondistended.  Extremities: No cyanosis, clubbing, rash, edema Neuro: Alert & orientedx3. Affect pleasant   Labs    CBC Recent Labs    08/04/23 1108 08/04/23 1110 08/05/23 0305  WBC 15.5*  --  14.2*  NEUTROABS 12.1*  --   --   HGB 13.4 13.6 13.9  HCT 38.4* 40.0 41.1  MCV 90.4  --  92.2  PLT 193  --  188   Basic Metabolic Panel Recent Labs    40/10/27 1409 08/04/23 1655 08/05/23 0305  NA 139  --  140  K 3.3*  --  5.2*  CL 95*  --  102  CO2 30  --  25  GLUCOSE 252*  --  178*  BUN 11  --  15  CREATININE 1.25*  --  1.30*  CALCIUM 9.1  --  9.1  MG 1.6* 1.6*  --    Liver Function Tests Recent Labs    08/04/23 1108  AST 44*  ALT 26  ALKPHOS 67  BILITOT 0.8  PROT 5.6*  ALBUMIN 2.9*   No results for input(s): "LIPASE", "AMYLASE" in the last 72 hours. Cardiac Enzymes No results for input(s): "CKTOTAL", "CKMB", "CKMBINDEX", "TROPONINI" in the last 72  hours.  BNP: BNP (last 3 results) No results for input(s): "BNP" in the last 8760 hours.  ProBNP (last 3 results) No results for input(s): "PROBNP" in the last 8760 hours.   D-Dimer No results for input(s): "DDIMER" in the last 72 hours. Hemoglobin A1C Recent Labs    08/04/23 1108  HGBA1C 8.4*   Fasting Lipid Panel Recent Labs    08/04/23 1108  CHOL 82  HDL 30*  LDLCALC 34  TRIG 92  CHOLHDL 2.7   Thyroid Function Tests No results for input(s): "TSH", "T4TOTAL", "T3FREE", "THYROIDAB" in the last 72 hours.  Invalid input(s): "FREET3"  Other results:   Imaging    ECHOCARDIOGRAM COMPLETE Result Date: 08/05/2023    ECHOCARDIOGRAM REPORT   Patient Name:   Kevin Frank Date of Exam: 08/05/2023 Medical Rec #:  [id number]        Height:       70.0 in Accession #:    [id number]       Weight:       220.0 lb Date of Birth:  [birth year]        BSA:  2.174 m Patient Age:    82 years         BP:           112/52 mmHg Patient Gender: M                HR:           52 bpm. Exam Location:  Inpatient Procedure: 2D Echo, Color Doppler and Cardiac Doppler (Both Spectral and Color            Flow Doppler were utilized during procedure). Indications:    Acute MI i21.9  History:        Patient has prior history of Echocardiogram examinations, most                 recent 11/15/2019. CAD, COPD; Risk Factors:Hypertension,                 Diabetes, Dyslipidemia and Sleep Apnea.  Sonographer:    Irving Burton Senior RDCS Referring Phys: [id number] Orbie Pyo  Sonographer Comments: Technically difficult due to COPD IMPRESSIONS  1. Left ventricular ejection fraction, by estimation, is 60 to 65%. The left ventricle has normal function. The left ventricle has no regional wall motion abnormalities. There is mild concentric left ventricular hypertrophy. Left ventricular diastolic parameters are consistent with Grade II diastolic dysfunction (pseudonormalization). Elevated left ventricular end-diastolic  pressure.  2. Right ventricular systolic function is normal. The right ventricular size is normal. There is normal pulmonary artery systolic pressure. The estimated right ventricular systolic pressure is 30.8 mmHg.  3. The mitral valve is normal in structure. Mild mitral valve regurgitation.  4. The aortic valve is tricuspid. Aortic valve regurgitation is not visualized. Aortic valve sclerosis/calcification is present, without any evidence of aortic stenosis.  5. The inferior vena cava is dilated in size with <50% respiratory variability, suggesting right atrial pressure of 15 mmHg. Comparison(s): Changes from prior study are noted. 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 FINDINGS  Left Ventricle: Left ventricular ejection fraction, by estimation, is 60 to 65%. The left ventricle has normal function. The left ventricle has no regional wall motion abnormalities. Definity contrast agent was given IV to delineate the left ventricular  endocardial borders. Strain imaging was not performed. The left ventricular internal cavity size was normal in size. There is mild concentric left ventricular hypertrophy. Left ventricular diastolic parameters are consistent with Grade II diastolic dysfunction (pseudonormalization). Elevated left ventricular end-diastolic pressure. Right Ventricle: The right ventricular size is normal. No increase in right ventricular wall thickness. Right ventricular systolic function is normal. There is normal pulmonary artery systolic pressure. The tricuspid regurgitant velocity is 1.99 m/s, and  with an assumed right atrial pressure of 15 mmHg, the estimated right ventricular systolic pressure is 30.8 mmHg. Left Atrium: Left atrial size was normal in size. Right Atrium: Right atrial size was normal in size. Pericardium: There is no evidence of pericardial effusion. Presence of epicardial fat layer. Mitral Valve: The mitral valve is normal in structure. Mild mitral valve regurgitation.  Tricuspid Valve: The tricuspid valve is normal in structure. Tricuspid valve regurgitation is trivial. Aortic Valve: The aortic valve is tricuspid. Aortic valve regurgitation is not visualized. Aortic valve sclerosis/calcification is present, without any evidence of aortic stenosis. Pulmonic Valve: The pulmonic valve was normal in structure. Pulmonic valve regurgitation is trivial. Aorta: The ascending aorta was not well visualized and the aortic root is normal in size and structure. Venous: The inferior vena cava is dilated in size  with less than 50% respiratory variability, suggesting right atrial pressure of 15 mmHg. IAS/Shunts: No atrial level shunt detected by color flow Doppler. Additional Comments: 3D imaging was not performed.  LEFT VENTRICLE PLAX 2D LVIDd:         5.00 cm   Diastology LVIDs:         3.80 cm   LV e' medial:    5.66 cm/s LV PW:         1.20 cm   LV E/e' medial:  17.8 LV IVS:        1.00 cm   LV e' lateral:   8.16 cm/s LVOT diam:     2.30 cm   LV E/e' lateral: 12.4 LV SV:         82 LV SV Index:   38 LVOT Area:     4.15 cm  RIGHT VENTRICLE RV S prime:     12.60 cm/s TAPSE (M-mode): 1.9 cm LEFT ATRIUM             Index        RIGHT ATRIUM           Index LA diam:        3.70 cm 1.70 cm/m   RA Area:     19.90 cm LA Vol (A2C):   55.8 ml 25.67 ml/m  RA Volume:   51.10 ml  23.51 ml/m LA Vol (A4C):   69.9 ml 32.16 ml/m LA Biplane Vol: 64.7 ml 29.77 ml/m  AORTIC VALVE LVOT Vmax:   102.00 cm/s LVOT Vmean:  70.700 cm/s LVOT VTI:    0.198 m  AORTA Ao Root diam: 3.40 cm MITRAL VALVE                TRICUSPID VALVE MV Area (PHT): 2.74 cm     TR Peak grad:   15.8 mmHg MV Decel Time: 277 msec     TR Vmax:        199.00 cm/s MV E velocity: 101.00 cm/s MV A velocity: 59.20 cm/s   SHUNTS MV E/A ratio:  1.71         Systemic VTI:  0.20 m                             Systemic Diam: 2.30 cm Zoila Shutter MD Electronically signed by Zoila Shutter MD Signature Date/Time: 08/05/2023/10:59:12 AM    Final    [place]  EKG SITE RITE Result Date: 08/04/2023 If Site Rite image not attached, placement could not be confirmed due to current cardiac rhythm.    Medications:     Scheduled Medications:  aspirin  81 mg Oral Daily   atorvastatin  80 mg Oral Daily   Chlorhexidine Gluconate Cloth  6 each Topical Daily   enoxaparin (LOVENOX) injection  40 mg Subcutaneous Daily   sodium chloride flush  3 mL Intravenous Q12H   ticagrelor  90 mg Oral BID   torsemide  20 mg Oral Daily    Infusions:  sodium chloride      PRN Medications: sodium chloride, acetaminophen, ondansetron (ZOFRAN) IV, sodium chloride flush    Patient Profile   83 y.o. 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 Admitted with inferior STEMI s/p DES X 3 c/b bradycardia.  Assessment/Plan  1. CAD: Inferior STEMI, initially complicated by bradycardia/hypotension now resolved.  99% stenosis proximal RCA, minimal disease in other  vessels.  DES x 3 to proximal RCA, good result.  No recurrent chest pain. - Unable to afford brilinta. Load with plavix tonight. Continue 81 mg aspirin. - Atorvastatin 80.  2. H/o HFpEF: Prior EF normal by echo in 5/21.   - Echo EF 60-65, RV okay, dilated IVC - Slightly volume up with dilated IVC on echo. Got 20 mg Torsemide this am. - Start Farxiga 10 mg daily - Start losartan 25 mg daily (K is 5.2 but aggressively supplemented yesterday - Hold off on metoprolol with bradycardia.  3. COPD with asthma: On home oxygen, active smoking   I strongly encouraged him to quit smoking.  4. H/o lung cancer: S/p radiation.  5. OSA: Continue CPAP.  6. Bradycardia: Transient sinus bradycardia on telemetry, HR dropping to 40s briefly at times.  No hypotension. Think this is a result of inferior MI, seems to be improving on review of telemetry - Hold beta blockade.   Transfer to telemetry floor. Cardiology Team C to assume care.   Length of Stay: 1  FINCH, LINDSAY N, PA-C   08/05/2023, 11:55 AM  Advanced Heart Failure Team Pager [phone] (M-F; 7a - 5p)  Please contact [facility] for night-coverage after hours (5p -7a ) and weekends on [email or web address]   Patient seen with PA, I formulated the plan and agree with the above note.   No chest pain or dyspnea today.   Echo reviewed, on my read EF 55% with basal inferior hypokinesis, RV probably normal, mild MR, dilated IVC.   Intermittent HR in 50s, generally in 80s.   He had not urinated since last night, creatinine 1.3 with K 5.2.   General: NAD Neck: JVP 8-9 cm, no thyromegaly or thyroid nodule.  Lungs: Clear to auscultation bilaterally with normal respiratory effort. CV: Nondisplaced PMI.  Heart regular S1/S2, no S3/S4, no murmur.  Trace ankle edema.   Abdomen: Soft, nontender, no hepatosplenomegaly, no distention.  Skin: Intact without lesions or rashes.  Neurologic: Alert and oriented x 3.  Psych: Normal affect. Extremities: No clubbing or cyanosis.  HEENT: Normal.   No further hemodynamically significant bradycardia, HR down to around 50 but not lower since yesterday evening.  Suspect this was a result of inferior MI, now resolving.   - No beta blocker for now.   HFpEF with mild volume overload and dilated IVC on echo.  - Lasix 40 mg IV x 1.  - Will start Farxiga 10 mg daily.  - Will not start ARB yet with K elevated and not making much urine.   No further chest pain.  Continue ASA and statin.  Will transition to Plavix off ticagrelor (load Plavix) as he will not be able to get ticagrelor at home (not covered).   Can go to telemetry on Team C.   Marca Ancona 08/05/2023 12:31 PM

## 2023-08-05 NOTE — Telephone Encounter (Signed)
He is in the hospital following an MI right now. Just FYI.  Malachi Bonds, MD [facility]

## 2023-08-05 NOTE — Telephone Encounter (Signed)
Patient Product/process development scientist completed.    The patient is insured through [employer]. Patient has Medicare and is not eligible for a copay card, but may be able to apply for patient assistance or Medicare RX Payment Plan (Patient Must reach out to their plan, if eligible for payment plan), if available.    Ran test claim for Jardiance 10 mg and the current 30 day co-pay is $297.00.  Due to a $250.00 deductible.  Will be $47.00 once deductible is met.  Ran test claim for Farxiga 10 mg and the current 30 day co-pay is $391.32.  Due to a $250.00 deductible.  Will be $141.32 once deductible is met.  Ran test claim for Eliquis 5 mg and the current 30 day co-pay is $297.00.  Due to a $250.00 deductible.  Will be $47.00 once deductible is met.  This test claim was processed through [facility]- copay amounts may vary at other pharmacies due to pharmacy/plan contracts, or as the patient moves through the different stages of their insurance plan.     Roland Earl, CPHT Pharmacy Technician III Certified Patient Advocate [facility] Patient Advocate Team Direct Number: [phone]  Fax: [phone]

## 2023-08-06 ENCOUNTER — Other Ambulatory Visit (HOSPITAL_COMMUNITY): Payer: Self-pay

## 2023-08-06 DIAGNOSIS — I4891 Unspecified atrial fibrillation: Secondary | ICD-10-CM

## 2023-08-06 DIAGNOSIS — I2111 ST elevation (STEMI) myocardial infarction involving right coronary artery: Secondary | ICD-10-CM | POA: Diagnosis not present

## 2023-08-06 LAB — MAGNESIUM: Magnesium: 2 mg/dL (ref 1.7–2.4)

## 2023-08-06 LAB — BASIC METABOLIC PANEL
Anion gap: 12 (ref 5–15)
BUN: 23 mg/dL (ref 8–23)
CO2: 23 mmol/L (ref 22–32)
Calcium: 8.6 mg/dL — ABNORMAL LOW (ref 8.9–10.3)
Chloride: 103 mmol/L (ref 98–111)
Creatinine, Ser: 1.27 mg/dL — ABNORMAL HIGH (ref 0.61–1.24)
GFR, Estimated: 56 mL/min — ABNORMAL LOW (ref >60)
Glucose, Bld: 149 mg/dL — ABNORMAL HIGH (ref 70–99)
Potassium: 4.4 mmol/L (ref 3.5–5.1)
Sodium: 138 mmol/L (ref 135–145)

## 2023-08-06 MED ORDER — ALLOPURINOL 300 MG PO TABS
300.0000 mg | ORAL_TABLET | Freq: Every day | ORAL | Status: DC
  Administered 2023-08-06 – 2023-08-11 (×6): 300 mg via ORAL
  Filled 2023-08-06 (×6): qty 1

## 2023-08-06 MED ORDER — UMECLIDINIUM BROMIDE 62.5 MCG/ACT IN AEPB
1.0000 | INHALATION_SPRAY | Freq: Every day | RESPIRATORY_TRACT | Status: DC
  Administered 2023-08-08 – 2023-08-11 (×4): 1 via RESPIRATORY_TRACT
  Filled 2023-08-06: qty 7

## 2023-08-06 MED ORDER — AMIODARONE LOAD VIA INFUSION
150.0000 mg | Freq: Once | INTRAVENOUS | Status: AC
  Administered 2023-08-06: 150 mg via INTRAVENOUS
  Filled 2023-08-06: qty 83.34

## 2023-08-06 MED ORDER — AMIODARONE HCL IN DEXTROSE 360-4.14 MG/200ML-% IV SOLN
30.0000 mg/h | INTRAVENOUS | Status: DC
  Administered 2023-08-06 – 2023-08-08 (×4): 30 mg/h via INTRAVENOUS
  Filled 2023-08-06 (×5): qty 200

## 2023-08-06 MED ORDER — SERTRALINE HCL 50 MG PO TABS
50.0000 mg | ORAL_TABLET | Freq: Every day | ORAL | Status: DC
  Administered 2023-08-06 – 2023-08-11 (×6): 50 mg via ORAL
  Filled 2023-08-06 (×6): qty 1

## 2023-08-06 MED ORDER — AMIODARONE HCL IN DEXTROSE 360-4.14 MG/200ML-% IV SOLN
60.0000 mg/h | INTRAVENOUS | Status: DC
  Administered 2023-08-06 (×2): 60 mg/h via INTRAVENOUS
  Filled 2023-08-06: qty 200

## 2023-08-06 MED ORDER — APIXABAN 5 MG PO TABS
5.0000 mg | ORAL_TABLET | Freq: Two times a day (BID) | ORAL | Status: DC
  Administered 2023-08-06 – 2023-08-11 (×11): 5 mg via ORAL
  Filled 2023-08-06 (×11): qty 1

## 2023-08-06 MED ORDER — FLUTICASONE FUROATE-VILANTEROL 200-25 MCG/ACT IN AEPB
1.0000 | INHALATION_SPRAY | Freq: Every day | RESPIRATORY_TRACT | Status: DC
  Administered 2023-08-08 – 2023-08-11 (×4): 1 via RESPIRATORY_TRACT
  Filled 2023-08-06: qty 28

## 2023-08-06 MED ORDER — LOSARTAN POTASSIUM 25 MG PO TABS
25.0000 mg | ORAL_TABLET | Freq: Every day | ORAL | Status: DC
  Administered 2023-08-06 – 2023-08-11 (×6): 25 mg via ORAL
  Filled 2023-08-06 (×6): qty 1

## 2023-08-06 NOTE — TOC Progression Note (Signed)
Transition of Care [facility]) - Progression Note    Patient Details  Name: Kevin Frank MRN: [id number] Date of Birth: Oct 17, 1940  Transition of Care [facility]) CM/SW Contact  Elliot Cousin, RN Phone Number: [phone] 08/06/2023, 4:54 PM  Clinical Narrative:     TOC CM spoke to pt and wife at bedside. Offered choice for HH, (medicare.gov list placed on chart and given to family) Agreeable to [facility] for [facility]. Contacted [employer] rep, Mitch. Wife states he is having issues with oxygen and needs supplies for CPAP. Son was able to give rep his number to call to follow up on having agency come and service DME. Will continue to follow for dc needs.   Contacted [facility] rep, Clifton Custard with new referral.   Expected Discharge Plan: Home w Home Health Services Barriers to Discharge: Continued Medical Work up  Expected Discharge Plan and Services   Discharge Planning Services: CM Consult Post Acute Care Choice: Home Health Living arrangements for the past 2 months: Single Family Home                 DME Arranged: Other see comment DME Agency: [facility] Date DME Agency Contacted: 08/06/23 Time DME Agency Contacted: [phone] Representative spoke with at DME Agency: Mitch HH Arranged: RN, PT HH Agency: [facility] Date [facility] Agency Contacted: 08/06/23 Time HH Agency Contacted: 1652 Representative spoke with at [facility] Agency: Clifton Custard   Social Determinants of Health (SDOH) Interventions SDOH Screenings   Food Insecurity: No Food Insecurity (08/05/2023)  Housing: Low Risk  (08/05/2023)  Transportation Needs: No Transportation Needs (08/05/2023)  Utilities: Not At Risk (08/05/2023)  Depression (PHQ2-9): Low Risk  (09/07/2019)  Financial Resource Strain: Low Risk  (03/05/2022)  Social Connections: Socially Integrated (08/05/2023)  Tobacco Use: High Risk (05/28/2023)   Received from [facility]    Readmission Risk Interventions     No data to display

## 2023-08-06 NOTE — Evaluation (Signed)
Occupational Therapy Evaluation Patient Details Name: Kevin Frank MRN: [id number] DOB: 18-Jan-1941 Today's Date: 08/06/2023   History of Present Illness   Pt is 83 yo presenting to [facility] via EMS with inferior STEMI on 08/04/23. PMH: D HF, HTN, hyperlipidemia, CAD, Dm II, AAA s/p repair, lung cancer, COPD and OSA on 5L at home CPAP.     Clinical Impressions At baseline, pt is Independent with ADLs, IADLs, and functional mobility without an AD, and drives. Pt now presents with decreased activity tolerance, decreased cognition, decreased balance, and decreased safety and independence with functional tasks. Pt currently demonstrates ability to complete ADLs with Mod I to Min assist and functional step-pivot transfers with HHA +1 with CGA. HR in the upper 70s to low 110s. O2 sat >/94% on 5L continuous O2 through HFNC. No episodes of orthostatic hypotension with sitting or standing this session. Pt participated well in session and is motivated to return to PLOF. Pt will benefit from acute skilled OT services to address deficits outlined below and to increase safety and independence with functional tasks. Post acute discharge, pt will benefit from continued skilled OT services in the home to maximize rehab potential.      If plan is discharge home, recommend the following:   A little help with walking and/or transfers;A little help with bathing/dressing/bathroom;Assistance with cooking/housework;Direct supervision/assist for medications management;Direct supervision/assist for financial management;Assist for transportation;Help with stairs or ramp for entrance     Functional Status Assessment   Patient has had a recent decline in their functional status and demonstrates the ability to make significant improvements in function in a reasonable and predictable amount of time.     Equipment Recommendations   Other (comment);BSC/3in1 (other TBD based on pt progress)     Recommendations for  Other Services         Precautions/Restrictions   Precautions Precautions: Fall Restrictions Weight Bearing Restrictions Per Provider Order: Yes RUE Weight Bearing Per Provider Order: Non weight bearing     Mobility Bed Mobility Overal bed mobility: Needs Assistance Bed Mobility: Supine to Sit, Sit to Supine     Supine to sit: Supervision, HOB elevated Sit to supine: Contact guard assist, HOB elevated   General bed mobility comments: Cues for hand placement/technique and safety    Transfers Overall transfer level: Needs assistance Equipment used: 1 person hand held assist Transfers: Sit to/from Stand, Bed to chair/wheelchair/BSC Sit to Stand: Contact guard assist     Step pivot transfers: Contact guard assist     General transfer comment: With cues for safety and hand placement/technique. Pt also able to take 3 side steps at EOB toward [facility] with CGA with cues for safety.      Balance Overall balance assessment: Needs assistance Sitting-balance support: No upper extremity supported, Feet supported Sitting balance-Leahy Scale: Good Sitting balance - Comments: sitting EOB and on BSC   Standing balance support: Single extremity supported, During functional activity Standing balance-Leahy Scale: Fair Standing balance comment: reliant on support of therapist to maintain balance in dynamic standing/stepping                           ADL either performed or assessed with clinical judgement   ADL Overall ADL's : Needs assistance/impaired Eating/Feeding: Modified independent;Sitting   Grooming: Set up;Sitting   Upper Body Bathing: Contact guard assist;Sitting;Cueing for safety;Cueing for compensatory techniques   Lower Body Bathing: Minimal assistance;Sit to/from stand;Sitting/lateral leans;Cueing for safety;Cueing for compensatory techniques  Upper Body Dressing : Contact guard assist;Sitting Upper Body Dressing Details (indicate cue type and reason):  assist for managing lines, otherwise Set up/Supervision Lower Body Dressing: Contact guard assist;Sitting/lateral leans;Sit to/from stand;Cueing for safety   Toilet Transfer: Contact guard assist;BSC/3in1 (step-pivot; HHA +1)   Toileting- Clothing Manipulation and Hygiene: Contact guard assist;Minimal assistance;Sit to/from stand;Cueing for compensatory techniques;Cueing for safety         General ADL Comments: Pt presents with decreased activity tolerance, fatiguing quickly during tasks. Pt also with decreased safety awareness and decreased insight into deficits affecting safety during functional tasks.     Vision Baseline Vision/History: 1 Wears glasses (readers) Ability to See in Adequate Light: 0 Adequate (with glasses) Patient Visual Report: No change from baseline       Perception         Praxis         Pertinent Vitals/Pain Pain Assessment Pain Assessment: No/denies pain     Extremity/Trunk Assessment Upper Extremity Assessment Upper Extremity Assessment: Defer to OT evaluation   Lower Extremity Assessment Lower Extremity Assessment: Overall WFL for tasks assessed   Cervical / Trunk Assessment Cervical / Trunk Assessment: Normal   Communication Communication Communication: No apparent difficulties;Other (comment) (Pt is HOH and did not have hearing aids present during evaluation. Suspect little to no communication issues with use of hearing aids.) Factors Affecting Communication: Hearing impaired (Pt has and wears hearing aids, but they were not present during this session. Pt's daughter reports his wife plans to bring them to the hospital later this day.)   Cognition Arousal: Alert Behavior During Therapy: [facility] for tasks assessed/performed, Impulsive (Largely WFL with occasional impulsiveness with movement; this may be related to pt being [facility] and not hearing instructions clearly) Cognition: Cognition impaired   Orientation impairments: Time, Situation (Pt  uncertain of year and reports he is at the hospital due to breathing issues, not remembering STEMI.) Awareness: Intellectual awareness impaired, Online awareness impaired Memory impairment (select all impairments): Short-term memory, Working memory, Engineer, structural memory Attention impairment (select first level of impairment): Alternating attention Executive functioning impairment (select all impairments): Problem solving, Reasoning OT - Cognition Comments: Pleasant throughout session and following 1 and 2-step instructions consitently with cues. pt presents with decreased safety awareness and decreased insight into deficits.                 Following commands: Intact       Cueing  General Comments   Cueing Techniques: Visual cues;Tactile cues (due to pt being [facility] and not having hearing aids present)   HR in the upper 70s to low 110s. Pt with brief VTach recorded during transfer back to supine in bed; however, uncertain if this is acurate due to poor pleth line at that time. O2 sat >/94% on 5L continuous O2 through HFNC. No episodes of orthostatic hypotension with sitting or standing this session. Pt's daughter present throughout session. PT entering room at end of session.    Exercises     Shoulder Instructions      Home Living Family/patient expects to be discharged to:: Private residence Living Arrangements: Spouse/significant other Available Help at Discharge: Family;Available 24 hours/day;Available PRN/intermittently (Wife available 24/7 and can assist with meal prep/home management but is not able to provide physical assist to pt; daughter and son live nearby and can assist PRN; son is retired and daughter works full time) Type of Home: House Home Access: Stairs to enter [employer] of Steps: 2 then 1 up into house Entrance  Stairs-Rails: None Home Layout: One level     Bathroom Shower/Tub: Tub/shower unit;Walk-in shower (Pt typically uses the tub/shower  combo)   Bathroom Toilet: Standard     Home Equipment: None          Prior Functioning/Environment Prior Level of Function : Independent/Modified Independent;Driving             Mobility Comments: Independent without an AD; no falls in past 6 months ADLs Comments: Independent with ADLs and IADLs and drives. Pt goes to get breakfast with friends most mornings and then spends a good portion of the afternoon in bed until dinner time.    OT Problem List: Decreased activity tolerance;Impaired balance (sitting and/or standing);Decreased safety awareness;Decreased knowledge of use of DME or AE;Decreased cognition   OT Treatment/Interventions: Self-care/ADL training;Energy conservation;DME and/or AE instruction;Therapeutic activities;Patient/family education;Balance training;Cognitive remediation/compensation      OT Goals(Current goals can be found in the care plan section)   Acute Rehab OT Goals Patient Stated Goal: to return home and be independent OT Goal Formulation: With patient/family Time For Goal Achievement: 08/20/23 Potential to Achieve Goals: Good ADL Goals Pt Will Perform Grooming: with modified independence;standing Pt Will Perform Lower Body Bathing: with modified independence;sit to/from stand Pt Will Perform Lower Body Dressing: with modified independence;sit to/from stand Pt Will Transfer to Toilet: with modified independence;regular height toilet;ambulating (with least restrictive AD) Pt Will Perform Toileting - Clothing Manipulation and hygiene: sitting/lateral leans;sit to/from stand;with modified independence   OT Frequency:  Min 1X/week    Co-evaluation              AM-PAC OT "6 Clicks" Daily Activity     Outcome Measure Help from another person eating meals?: None Help from another person taking care of personal grooming?: A Little Help from another person toileting, which includes using toliet, bedpan, or urinal?: A Little Help from another  person bathing (including washing, rinsing, drying)?: A Little Help from another person to put on and taking off regular upper body clothing?: A Little Help from another person to put on and taking off regular lower body clothing?: A Little 6 Click Score: 19   End of Session Equipment Utilized During Treatment: Oxygen Nurse Communication: Mobility status;Other (comment) (Pt with noted bleeding at IV site on Left hand with RN reporting she is aware and waiting for IV team. Brief VTach recorded during bed transfer, but OT uncertain if acurate with RN stating she will assess further.)  Activity Tolerance: Patient tolerated treatment well Patient left: in bed;with call bell/phone within reach;with family/visitor present (with PT entering room)  OT Visit Diagnosis: Unsteadiness on feet (R26.81);Other symptoms and signs involving cognitive function;Other (comment) (decreased activity tolerance)                Time: 5409-8119 OT Time Calculation (min): 42 min Charges:  OT General Charges $OT Visit: 1 Visit OT Evaluation $OT Eval Low Complexity: 1 Low OT Treatments $Self Care/Home Management : 8-22 mins  Kevin Frank "Kevin Eva., OTR/L, MA Acute Rehab [phone]  Kevin Frank 08/06/2023, 12:36 PM

## 2023-08-06 NOTE — Progress Notes (Signed)
Arrived to room for stat IV team consult to patient walking with PT out of the room.  PT states that it won't be a long walk, will wait for patient to return to room

## 2023-08-06 NOTE — Progress Notes (Signed)
Rounding Note    Patient Name: Kevin Frank Date of Encounter: 08/06/2023  [facility] Cardiologist: Dina Rich, MD  Subjective   Pt went into atrial fib around 6:30 pm yesterday. No complaints today.   Inpatient Medications    Scheduled Meds:  aspirin  81 mg Oral Daily   atorvastatin  80 mg Oral Daily   Chlorhexidine Gluconate Cloth  6 each Topical Daily   clopidogrel  75 mg Oral Daily   empagliflozin  10 mg Oral Daily   enoxaparin (LOVENOX) injection  40 mg Subcutaneous Daily   nicotine  21 mg Transdermal Daily   sodium chloride flush  3 mL Intravenous Q12H   torsemide  20 mg Oral Daily   Continuous Infusions:  PRN Meds: acetaminophen, ondansetron (ZOFRAN) IV, sodium chloride flush   Vital Signs    Vitals:   08/06/23 0530 08/06/23 0600 08/06/23 0630 08/06/23 0700  BP: (!) 146/91 118/71 (!) 112/98 133/86  Pulse: (!) 126 (!) 116 (!) 103 (!) 118  Resp: 14 (!) 28 17 14   Temp:      TempSrc:      SpO2: 94% 94% 94% 96%  Weight:        Intake/Output Summary (Last 24 hours) at 08/06/2023 0719 Last data filed at 08/06/2023 1308 Gross per 24 hour  Intake 6 ml  Output 2085 ml  Net -2079 ml      08/04/2023   10:38 AM 07/09/2023   11:25 AM 05/19/2023    2:48 PM  Last 3 Weights  Weight (lbs) 220 lb 0.3 oz 219 lb 12.8 oz 226 lb 3.2 oz  Weight (kg) 99.8 kg 99.7 kg 102.604 kg      Telemetry    Atrial fib with rates 105-130 bpm - Personally Reviewed  ECG    No AM KEG - Personally Reviewed  Physical Exam   GEN: No acute distress.   Neck: No JVD Cardiac: Irreg irreg. No murmurs, rubs, or gallops.  Respiratory: Clear to auscultation bilaterally. GI: Soft, nontender, non-distended  MS: No edema; No deformity. Neuro:  Nonfocal  Psych: Normal affect   Labs    High Sensitivity Troponin:   Recent Labs  Lab 08/04/23 1108 08/04/23 1404  TROPONINIHS 1,030* 17,294*     Chemistry Recent Labs  Lab 08/04/23 1108 08/04/23 1110  08/04/23 1409 08/04/23 1655 08/05/23 0305 08/06/23 0214  NA 138   < > 139  --  140 138  K 2.8*   < > 3.3*  --  5.2* 4.4  CL 97*  --  95*  --  102 103  CO2 27  --  30  --  25 23  GLUCOSE 268*  --  252*  --  178* 149*  BUN 12  --  11  --  15 23  CREATININE 1.29*  --  1.25*  --  1.30* 1.27*  CALCIUM 8.2*  --  9.1  --  9.1 8.6*  MG  --   --  1.6* 1.6*  --   --   PROT 5.6*  --   --   --   --   --   ALBUMIN 2.9*  --   --   --   --   --   AST 44*  --   --   --   --   --   ALT 26  --   --   --   --   --   ALKPHOS 67  --   --   --   --   --  BILITOT 0.8  --   --   --   --   --   GFRNONAA 55*  --  57*  --  55* 56*  ANIONGAP 14  --  14  --  13 12   < > = values in this interval not displayed.    Lipids  Recent Labs  Lab 08/04/23 1108  CHOL 82  TRIG 92  HDL 30*  LDLCALC 34  CHOLHDL 2.7    Hematology Recent Labs  Lab 08/04/23 1108 08/04/23 1110 08/05/23 0305  WBC 15.5*  --  14.2*  RBC 4.25  --  4.46  HGB 13.4 13.6 13.9  HCT 38.4* 40.0 41.1  MCV 90.4  --  92.2  MCH 31.5  --  31.2  MCHC 34.9  --  33.8  RDW 13.7  --  13.8  PLT 193  --  188   Thyroid No results for input(s): "TSH", "FREET4" in the last 168 hours.  BNPNo results for input(s): "BNP", "PROBNP" in the last 168 hours.  DDimer No results for input(s): "DDIMER" in the last 168 hours.   Radiology    ECHOCARDIOGRAM COMPLETE Result Date: 08/05/2023    ECHOCARDIOGRAM REPORT   Patient Name:   Kevin Frank Date of Exam: 08/05/2023 Medical Rec #:  [id number]        Height:       70.0 in Accession #:    [id number]       Weight:       220.0 lb Date of Birth:  06-24-1940        BSA:          2.174 m Patient Age:    82 years         BP:           112/52 mmHg Patient Gender: M                HR:           52 bpm. Exam Location:  Inpatient Procedure: 2D Echo, Color Doppler and Cardiac Doppler (Both Spectral and Color            Flow Doppler were utilized during procedure). Indications:    Acute MI i21.9  History:         Patient has prior history of Echocardiogram examinations, most                 recent 11/15/2019. CAD, COPD; Risk Factors:Hypertension,                 Diabetes, Dyslipidemia and Sleep Apnea.  Sonographer:    Irving Burton Senior RDCS Referring Phys: [id number] Orbie Pyo  Sonographer Comments: Technically difficult due to COPD IMPRESSIONS  1. Left ventricular ejection fraction, by estimation, is 60 to 65%. The left ventricle has normal function. The left ventricle has no regional wall motion abnormalities. There is mild concentric left ventricular hypertrophy. Left ventricular diastolic parameters are consistent with Grade II diastolic dysfunction (pseudonormalization). Elevated left ventricular end-diastolic pressure.  2. Right ventricular systolic function is normal. The right ventricular size is normal. There is normal pulmonary artery systolic pressure. The estimated right ventricular systolic pressure is 30.8 mmHg.  3. The mitral valve is normal in structure. Mild mitral valve regurgitation.  4. The aortic valve is tricuspid. Aortic valve regurgitation is not visualized. Aortic valve sclerosis/calcification is present, without any evidence of aortic stenosis.  5. The inferior vena cava is dilated in size with <50% respiratory variability, suggesting  right atrial pressure of 15 mmHg. Comparison(s): Changes from prior study are noted. 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 FINDINGS  Left Ventricle: Left ventricular ejection fraction, by estimation, is 60 to 65%. The left ventricle has normal function. The left ventricle has no regional wall motion abnormalities. Definity contrast agent was given IV to delineate the left ventricular  endocardial borders. Strain imaging was not performed. The left ventricular internal cavity size was normal in size. There is mild concentric left ventricular hypertrophy. Left ventricular diastolic parameters are consistent with Grade II diastolic dysfunction  (pseudonormalization). Elevated left ventricular end-diastolic pressure. Right Ventricle: The right ventricular size is normal. No increase in right ventricular wall thickness. Right ventricular systolic function is normal. There is normal pulmonary artery systolic pressure. The tricuspid regurgitant velocity is 1.99 m/s, and  with an assumed right atrial pressure of 15 mmHg, the estimated right ventricular systolic pressure is 30.8 mmHg. Left Atrium: Left atrial size was normal in size. Right Atrium: Right atrial size was normal in size. Pericardium: There is no evidence of pericardial effusion. Presence of epicardial fat layer. Mitral Valve: The mitral valve is normal in structure. Mild mitral valve regurgitation. Tricuspid Valve: The tricuspid valve is normal in structure. Tricuspid valve regurgitation is trivial. Aortic Valve: The aortic valve is tricuspid. Aortic valve regurgitation is not visualized. Aortic valve sclerosis/calcification is present, without any evidence of aortic stenosis. Pulmonic Valve: The pulmonic valve was normal in structure. Pulmonic valve regurgitation is trivial. Aorta: The ascending aorta was not well visualized and the aortic root is normal in size and structure. Venous: The inferior vena cava is dilated in size with less than 50% respiratory variability, suggesting right atrial pressure of 15 mmHg. IAS/Shunts: No atrial level shunt detected by color flow Doppler. Additional Comments: 3D imaging was not performed.  LEFT VENTRICLE PLAX 2D LVIDd:         5.00 cm   Diastology LVIDs:         3.80 cm   LV e' medial:    5.66 cm/s LV PW:         1.20 cm   LV E/e' medial:  17.8 LV IVS:        1.00 cm   LV e' lateral:   8.16 cm/s LVOT diam:     2.30 cm   LV E/e' lateral: 12.4 LV SV:         82 LV SV Index:   38 LVOT Area:     4.15 cm  RIGHT VENTRICLE RV S prime:     12.60 cm/s TAPSE (M-mode): 1.9 cm LEFT ATRIUM             Index        RIGHT ATRIUM           Index LA diam:        3.70 cm  1.70 cm/m   RA Area:     19.90 cm LA Vol (A2C):   55.8 ml 25.67 ml/m  RA Volume:   51.10 ml  23.51 ml/m LA Vol (A4C):   69.9 ml 32.16 ml/m LA Biplane Vol: 64.7 ml 29.77 ml/m  AORTIC VALVE LVOT Vmax:   102.00 cm/s LVOT Vmean:  70.700 cm/s LVOT VTI:    0.198 m  AORTA Ao Root diam: 3.40 cm MITRAL VALVE                TRICUSPID VALVE MV Area (PHT): 2.74 cm     TR Peak grad:  15.8 mmHg MV Decel Time: 277 msec     TR Vmax:        199.00 cm/s MV E velocity: 101.00 cm/s MV A velocity: 59.20 cm/s   SHUNTS MV E/A ratio:  1.71         Systemic VTI:  0.20 m                             Systemic Diam: 2.30 cm Zoila Shutter MD Electronically signed by Zoila Shutter MD Signature Date/Time: 08/05/2023/10:59:12 AM    Final    [place] EKG SITE RITE Result Date: 08/04/2023 If Site Rite image not attached, placement could not be confirmed due to current cardiac rhythm.  CARDIAC CATHETERIZATION Result Date: 08/04/2023   Prox RCA to Mid RCA lesion is 99% stenosed.   A stent was successfully placed.   Post intervention, there is a 0% residual stenosis. 1.  99% mid right coronary artery lesion requiring complex PCI with multiple wires and GuideLiner with 3 overlapping drug-eluting stents.  The case was complicated by transient need for norepinephrine and vasopressin.  The patient received bicarbonate and Lasix 10 mg IV during the procedure.  All pressors were weaned at the conclusion of the procedure. 2.  Mild left-sided disease. 3.  Significant upper extremity tortuosity requiring destination sheath. 4.  LVEDP of 25 mmHg at the conclusion of the procedure and after Lasix had and administered. 5.  Due to inability to take p.o. during the procedure, the patient was treated with a cangrelor drip.  Transition to [facility] will be managed by pharmacy. Recommendation: Dual antiplatelet therapy for 1 year and aggressive treatment of cardiovascular risk factors.    Cardiac Studies    Patient Profile     83 y.o. male with history of  COPD on home O2, lung cancer, sleep apnea, HFpEF who was admitted with an inferior STEMI on 2/112/5. RCA with severe proximal to mid vessel stenosis treated with 3 overlapping drug eluting stents.   Assessment & Plan    CAD/Inferior STEMI: No chest pain. Continue DAPT with ASA and Plavix for one year (unable to afford Brilinta). Continue statin.   HFpEF: LVEF=60-65% by echo. No volume overload on exam. Continue Torsemide and Farxiga. Will start Losartan 25 mg today.   COPD: He is on home oxygen. Tobacco cessation encouraged  Sleep apnea: continue CPAP  New onset atrial fibrillation: Will start Eliquis 5 mg po BID. Will load with IV amiodarone today.   For questions or updates, please contact [facility] Please consult [email or web address] for contact info under        Signed, Verne Carrow, MD  08/06/2023, 7:19 AM

## 2023-08-06 NOTE — Discharge Instructions (Addendum)
 Information on my medicine - ELIQUIS (apixaban)  This medication education was reviewed with me or my healthcare representative as part of my discharge preparation.  The pharmacist that spoke with me during my hospital stay was:  Carron Brazen, RPH  Why was Eliquis prescribed for you? Eliquis was prescribed for you to reduce the risk of forming blood clots that can cause a stroke if you have a medical condition called atrial fibrillation (a type of irregular heartbeat) OR to reduce the risk of a blood clots forming after orthopedic surgery.  What do You need to know about Eliquis ? Take your Eliquis TWICE DAILY - one tablet in the morning and one tablet in the evening with or without food.  It would be best to take the doses about the same time each day.  If you have difficulty swallowing the tablet whole please discuss with your pharmacist how to take the medication safely.  Take Eliquis exactly as prescribed by your doctor and DO NOT stop taking Eliquis without talking to the doctor who prescribed the medication.  Stopping may increase your risk of developing a new clot or stroke.  Refill your prescription before you run out.  After discharge, you should have regular check-up appointments with your healthcare provider that is prescribing your Eliquis.  In the future your dose may need to be changed if your kidney function or weight changes by a significant amount or as you get older.  What do you do if you miss a dose? If you miss a dose, take it as soon as you remember on the same day and resume taking twice daily.  Do not take more than one dose of ELIQUIS at the same time.  Important Safety Information A possible side effect of Eliquis is bleeding. You should call your healthcare provider right away if you experience any of the following: Bleeding from an injury or your nose that does not stop. Unusual colored urine (red or dark brown) or unusual colored stools (red or  black). Unusual bruising for unknown reasons. A serious fall or if you hit your head (even if there is no bleeding).  Some medicines may interact with Eliquis and might increase your risk of bleeding or clotting while on Eliquis. To help avoid this, consult your healthcare provider or pharmacist prior to using any new prescription or non-prescription medications, including herbals, vitamins, non-steroidal anti-inflammatory drugs (NSAIDs) and supplements.  This website has more information on Eliquis (apixaban): http://[email or web address].com/eliquis/home  Information about your medication: Plavix (anti-platelet agent)  Generic Name (Brand): clopidogrel (Plavix), once daily medication  PURPOSE: You are taking this medication along with aspirin to lower your chance of having a heart attack, stroke, or blood clots in your heart stent. These can be fatal. Plavix and aspirin help prevent platelets from sticking together and forming a clot that can block an artery or your stent.   Common SIDE EFFECTS you may experience include: bruising or bleeding more easily, shortness of breath  Do not stop taking PLAVIX without talking to the doctor who prescribes it for you. People who are treated with a stent and stop taking Plavix too soon, have a higher risk of getting a blood clot in the stent, having a heart attack, or dying. If you stop Plavix because of bleeding, or for other reasons, your risk of a heart attack or stroke may increase.   Avoid taking NSAID agents or anti-inflammatory medications such as ibuprofen, naproxen given increased bleed risk with plavix - can  use acetaminophen (Tylenol) if needed for pain.  Avoid taking over the counter stomach medications omeprazole (Prilosec) or esomeprazole (Nexium) since these do interact and make plavix less effective - ask your pharmacist or doctor for alterative agents if needed for heartburn or GERD.   Tell all of your doctors and dentists that you are taking  Plavix. They should talk to the doctor who prescribed Plavix for you before you have any surgery or invasive procedure.   Contact your health care provider if you experience: severe or uncontrollable bleeding, pink/red/brown urine, vomiting blood or vomit that looks like "coffee grounds", red or black stools (looks like tar), coughing up blood or blood clots ----------------------------------------------------------------------------------------------------------------------   De-escalation of Triple Therapy Post-PCI  Underwent cardiac catheterization with placement of drug-eluting stent on 08/04/2023. Plan for triple therapy with aspirin 81 mg and clopidogrel (Plavix) in addition to oral anticoagulation using apixaban (Eliquis). Plan to discontinue aspirin on 09/03/2023 unless cardiology informs you to discontinue sooner.

## 2023-08-06 NOTE — Evaluation (Signed)
Physical Therapy Evaluation Patient Details Name: Kevin Frank MRN: [id number] DOB: 12/30/1940 Today's Date: 08/06/2023  History of Present Illness  Pt is 83 yo presenting to [facility] via EMS with inferior STEMI on 08/04/23. PMH: D HF, HTN, hyperlipidemia, CAD, Dm II, AAA s/p repair, lung cancer, COPD and OSA on 5L at home CPAP.  Clinical Impression  Pt is presenting slightly below baseline level of functioning. Currently pt is supervision to [facility] for all functional activities. No overt LOB. Light use of RW for balance during dynamic activities for safety. Pt has good family support at home. Due to pt current functional status, home set up and available assistance at home recommending skilled physical therapy services 3x/week in order to address strength, balance and functional mobility to decrease risk for falls, injury and re-hospitalization.           If plan is discharge home, recommend the following: Help with stairs or ramp for entrance;Assistance with cooking/housework;Assist for transportation     Equipment Recommendations None recommended by PT     Functional Status Assessment Patient has had a recent decline in their functional status and demonstrates the ability to make significant improvements in function in a reasonable and predictable amount of time.     Precautions / Restrictions Precautions Precautions: Fall Restrictions Weight Bearing Restrictions Per Provider Order: Yes RUE Weight Bearing Per Provider Order: Non weight bearing      Mobility  Bed Mobility Overal bed mobility: Needs Assistance Bed Mobility: Supine to Sit, Sit to Supine     Supine to sit: Supervision, HOB elevated Sit to supine: HOB elevated, Supervision   General bed mobility comments: Cues for technique and safety    Transfers Overall transfer level: Needs assistance Equipment used: Rolling walker (2 wheels) Transfers: Sit to/from Stand Sit to Stand: Contact guard assist            General transfer comment: With cues for safety and hand placement/technique.    Ambulation/Gait Ambulation/Gait assistance: Contact guard assist Gait Distance (Feet): 150 Feet Assistive device: Rolling walker (2 wheels) Gait Pattern/deviations: Step-through pattern Gait velocity: WNL Gait velocity interpretation: >2.62 ft/sec, indicative of community ambulatory   General Gait Details: Pt requires verbal cues for proximity to AD with intermittent forward flexion. Resting UE on RW without significant UE support just lightly for balance.     Balance Overall balance assessment: Needs assistance Sitting-balance support: No upper extremity supported, Feet supported Sitting balance-Leahy Scale: Good Sitting balance - Comments: sitting EOB   Standing balance support: Single extremity supported, During functional activity, Bilateral upper extremity supported Standing balance-Leahy Scale: Fair Standing balance comment: Light UE support just for balance, no overt LOB             Pertinent Vitals/Pain Pain Assessment Pain Assessment: No/denies pain    Home Living Family/patient expects to be discharged to:: Private residence Living Arrangements: Spouse/significant other Available Help at Discharge: Family;Available 24 hours/day;Available PRN/intermittently (Wife available 24/7 and can assist with meal prep/home management but is not able to provide physical assist to pt; daughter and son live nearby and can assist PRN; son is retired and daughter works full time) Type of Home: House Home Access: Stairs to enter Entrance Stairs-Rails: None Secretary/administrator of Steps: 2 then 1 up into house   Home Layout: One level Home Equipment: None      Prior Function Prior Level of Function : Independent/Modified Independent;Driving             Mobility Comments: Independent  without an AD; no falls in past 6 months ADLs Comments: Independent with ADLs and IADLs and drives. Pt goes  to get breakfast with friends most mornings and then spends a good portion of the afternoon in bed until dinner time.     Extremity/Trunk Assessment   Upper Extremity Assessment Upper Extremity Assessment: Defer to OT evaluation    Lower Extremity Assessment Lower Extremity Assessment: Overall WFL for tasks assessed    Cervical / Trunk Assessment Cervical / Trunk Assessment: Normal  Communication   Communication Communication: No apparent difficulties;Other (comment) (Pt is HOH and did not have hearing aids present during evaluation. Suspect little to no communication issues with use of hearing aids.) Factors Affecting Communication: Hearing impaired (Pt has and wears hearing aids, but they were not present during this session. Pt's daughter reports his wife plans to bring them to the hospital later this day.)    Cognition Arousal: Alert Behavior During Therapy: [facility] for tasks assessed/performed, Impulsive (Largely WFL with occasional impulsiveness with movement; this may be related to pt being [facility] and not hearing instructions clearly)   PT - Cognitive impairments: Problem solving, Safety/Judgement     Following commands: Intact       Cueing Cueing Techniques: Visual cues, Tactile cues (due to pt being [facility] and not having hearing aids present)     General Comments General comments (skin integrity, edema, etc.): Pt HR ~95 bpm at rest. With ambulation ~125 bpm max. O2 sats on 5L ~88%- 92% with ambulation        Assessment/Plan    PT Assessment Patient needs continued PT services  PT Problem Frank Decreased balance;Decreased activity tolerance;Decreased safety awareness       PT Treatment Interventions DME instruction;Therapeutic activities;Gait training;Therapeutic exercise;Patient/family education;Stair training;Balance training;Functional mobility training;Neuromuscular re-education    PT Goals (Current goals can be found in the Care Plan section)  Acute Rehab PT  Goals Patient Stated Goal: to return home with family PT Goal Formulation: With patient Time For Goal Achievement: 08/20/23 Potential to Achieve Goals: Good    Frequency Min 1X/week        AM-PAC PT "6 Clicks" Mobility  Outcome Measure Help needed turning from your back to your side while in a flat bed without using bedrails?: A Little Help needed moving from lying on your back to sitting on the side of a flat bed without using bedrails?: A Little Help needed moving to and from a bed to a chair (including a wheelchair)?: A Little Help needed standing up from a chair using your arms (e.g., wheelchair or bedside chair)?: A Little Help needed to walk in hospital room?: A Little Help needed climbing 3-5 steps with a railing? : A Little 6 Click Score: 18    End of Session Equipment Utilized During Treatment: Gait belt Activity Tolerance: Patient tolerated treatment well Patient left: in bed;with call bell/phone within reach;with bed alarm set;with nursing/sitter in room Nurse Communication: Mobility status PT Visit Diagnosis: Other abnormalities of gait and mobility (R26.89);Unsteadiness on feet (R26.81)    Time: 2130-8657 PT Time Calculation (min) (ACUTE ONLY): 23 min   Charges:   PT Evaluation $PT Eval Low Complexity: 1 Low PT Treatments $Therapeutic Activity: 8-22 mins PT General Charges $$ ACUTE PT VISIT: 1 Visit       Harrel Carina, DPT, CLT  Acute Rehabilitation Services Office: [phone] (Secure chat preferred)   Claudia Desanctis 08/06/2023, 12:06 PM

## 2023-08-06 NOTE — Addendum Note (Signed)
Addended by: Devoria Glassing on: 08/06/2023 09:21 AM   Modules accepted: Orders

## 2023-08-07 ENCOUNTER — Inpatient Hospital Stay (HOSPITAL_COMMUNITY): Payer: Medicare HMO

## 2023-08-07 DIAGNOSIS — I2111 ST elevation (STEMI) myocardial infarction involving right coronary artery: Secondary | ICD-10-CM | POA: Diagnosis not present

## 2023-08-07 DIAGNOSIS — I4891 Unspecified atrial fibrillation: Secondary | ICD-10-CM | POA: Diagnosis not present

## 2023-08-07 DIAGNOSIS — D72829 Elevated white blood cell count, unspecified: Secondary | ICD-10-CM | POA: Diagnosis not present

## 2023-08-07 DIAGNOSIS — I700 Atherosclerosis of aorta: Secondary | ICD-10-CM | POA: Diagnosis not present

## 2023-08-07 LAB — CBC WITH DIFFERENTIAL/PLATELET
Abs Immature Granulocytes: 0.13 10*3/uL — ABNORMAL HIGH (ref 0.00–0.07)
Basophils Absolute: 0.1 10*3/uL (ref 0.0–0.1)
Basophils Relative: 0 %
Eosinophils Absolute: 0 10*3/uL (ref 0.0–0.5)
Eosinophils Relative: 0 %
HCT: 43.3 % (ref 39.0–52.0)
Hemoglobin: 14.8 g/dL (ref 13.0–17.0)
Immature Granulocytes: 1 %
Lymphocytes Relative: 6 %
Lymphs Abs: 1.4 10*3/uL (ref 0.7–4.0)
MCH: 31.4 pg (ref 26.0–34.0)
MCHC: 34.2 g/dL (ref 30.0–36.0)
MCV: 91.7 fL (ref 80.0–100.0)
Monocytes Absolute: 1.5 10*3/uL — ABNORMAL HIGH (ref 0.1–1.0)
Monocytes Relative: 7 %
Neutro Abs: 20.1 10*3/uL — ABNORMAL HIGH (ref 1.7–7.7)
Neutrophils Relative %: 86 %
Platelets: 191 10*3/uL (ref 150–400)
RBC: 4.72 MIL/uL (ref 4.22–5.81)
RDW: 13.5 % (ref 11.5–15.5)
WBC: 23.2 10*3/uL — ABNORMAL HIGH (ref 4.0–10.5)
nRBC: 0 % (ref 0.0–0.2)

## 2023-08-07 LAB — URINALYSIS, ROUTINE W REFLEX MICROSCOPIC
Bilirubin Urine: NEGATIVE
Glucose, UA: >=500 mg/dL — AB
Ketones, ur: NEGATIVE mg/dL
Nitrite: POSITIVE — AB
Protein, ur: 30 mg/dL — AB
Specific Gravity, Urine: 1.021 (ref 1.005–1.030)
WBC, UA: >50 WBC/hpf (ref 0–5)
pH: 7 (ref 5.0–8.0)

## 2023-08-07 LAB — CBC
HCT: 41.6 % (ref 39.0–52.0)
Hemoglobin: 13.6 g/dL (ref 13.0–17.0)
MCH: 30.4 pg (ref 26.0–34.0)
MCHC: 32.7 g/dL (ref 30.0–36.0)
MCV: 93.1 fL (ref 80.0–100.0)
Platelets: 169 10*3/uL (ref 150–400)
RBC: 4.47 MIL/uL (ref 4.22–5.81)
RDW: 13.6 % (ref 11.5–15.5)
WBC: 13.6 10*3/uL — ABNORMAL HIGH (ref 4.0–10.5)
nRBC: 0 % (ref 0.0–0.2)

## 2023-08-07 LAB — BASIC METABOLIC PANEL
Anion gap: 11 (ref 5–15)
BUN: 21 mg/dL (ref 8–23)
CO2: 27 mmol/L (ref 22–32)
Calcium: 8.7 mg/dL — ABNORMAL LOW (ref 8.9–10.3)
Chloride: 98 mmol/L (ref 98–111)
Creatinine, Ser: 1.2 mg/dL (ref 0.61–1.24)
GFR, Estimated: >60 mL/min (ref >60)
Glucose, Bld: 184 mg/dL — ABNORMAL HIGH (ref 70–99)
Potassium: 3.8 mmol/L (ref 3.5–5.1)
Sodium: 136 mmol/L (ref 135–145)

## 2023-08-07 LAB — GLUCOSE, CAPILLARY: Glucose-Capillary: 158 mg/dL — ABNORMAL HIGH (ref 70–99)

## 2023-08-07 LAB — HEPATIC FUNCTION PANEL
ALT: 33 U/L (ref 0–44)
AST: 30 U/L (ref 15–41)
Albumin: 3.5 g/dL (ref 3.5–5.0)
Alkaline Phosphatase: 67 U/L (ref 38–126)
Bilirubin, Direct: 0.2 mg/dL (ref 0.0–0.2)
Indirect Bilirubin: 0.7 mg/dL (ref 0.3–0.9)
Total Bilirubin: 0.9 mg/dL (ref 0.0–1.2)
Total Protein: 7.3 g/dL (ref 6.5–8.1)

## 2023-08-07 LAB — LIPOPROTEIN A (LPA): Lipoprotein (a): 45.1 nmol/L — ABNORMAL HIGH (ref <75.0)

## 2023-08-07 LAB — TSH: TSH: 0.378 u[IU]/mL (ref 0.350–4.500)

## 2023-08-07 MED ORDER — GUAIFENESIN-DM 100-10 MG/5ML PO SYRP
15.0000 mL | ORAL_SOLUTION | ORAL | Status: DC | PRN
  Administered 2023-08-10: 15 mL via ORAL
  Filled 2023-08-07: qty 15

## 2023-08-07 MED ORDER — ALPRAZOLAM 0.5 MG PO TABS
0.5000 mg | ORAL_TABLET | Freq: Every evening | ORAL | Status: DC | PRN
  Administered 2023-08-08 – 2023-08-10 (×2): 0.5 mg via ORAL
  Filled 2023-08-07 (×2): qty 1

## 2023-08-07 MED ORDER — LOPERAMIDE HCL 2 MG PO CAPS: 2.0000 mg | ORAL_CAPSULE | ORAL | Status: DC | PRN

## 2023-08-07 MED ORDER — HYDROCORTISONE 1 % EX CREA
1.0000 | TOPICAL_CREAM | Freq: Three times a day (TID) | CUTANEOUS | Status: DC | PRN
  Administered 2023-08-09: 1 via TOPICAL
  Filled 2023-08-07: qty 28

## 2023-08-07 MED ORDER — ALUM & MAG HYDROXIDE-SIMETH 200-200-20 MG/5ML PO SUSP: 30.0000 mL | ORAL | Status: DC | PRN

## 2023-08-07 MED ORDER — METOPROLOL SUCCINATE ER 25 MG PO TB24
25.0000 mg | ORAL_TABLET | Freq: Every day | ORAL | Status: DC
  Administered 2023-08-07 – 2023-08-08 (×2): 25 mg via ORAL
  Filled 2023-08-07 (×2): qty 1

## 2023-08-07 MED ORDER — SODIUM CHLORIDE 0.9 % IV SOLN
1.0000 g | INTRAVENOUS | Status: DC
  Administered 2023-08-07 – 2023-08-09 (×3): 1 g via INTRAVENOUS
  Filled 2023-08-07 (×3): qty 10

## 2023-08-07 MED ORDER — MAGNESIUM HYDROXIDE 400 MG/5ML PO SUSP
30.0000 mL | Freq: Every day | ORAL | Status: DC | PRN
  Administered 2023-08-07: 30 mL via ORAL
  Filled 2023-08-07: qty 30

## 2023-08-07 MED ORDER — MELATONIN 3 MG PO TABS
3.0000 mg | ORAL_TABLET | Freq: Every day | ORAL | Status: DC
  Administered 2023-08-07 – 2023-08-10 (×3): 3 mg via ORAL
  Filled 2023-08-07 (×4): qty 1

## 2023-08-07 NOTE — Progress Notes (Signed)
Received page about patient noted to have more lethargy today, new generalized weakness with symmetric facial edema specifically on his lips.  Kind of a vague constellation of symptoms.  Does not seem to be in HFpEF exacerbation, euvolemic.  Chest x-ray here with no signs of pneumonia.  He has mild leukocytosis that seems to be downtrending.   Plan: Check UA Asked pharmacy to review medications that might be prone to allergy.  Was newly started on amiodarone yesterday 2/13.  Question whether this could be contributing.  He has preserved EF and BP room for other medications.  Could consider transitioning to BB/CCB in the morning if symptoms worsen. Will order CBC with differential

## 2023-08-07 NOTE — Inpatient Diabetes Management (Signed)
Inpatient Diabetes Program Recommendations  AACE/ADA: New Consensus Statement on Inpatient Glycemic Control (2015)  Target Ranges:  Prepandial:   less than 140 mg/dL      Peak postprandial:   less than 180 mg/dL (1-2 hours)      Critically ill patients:  140 - 180 mg/dL   Lab Results  Component Value Date   GLUCAP 158 (H) 08/07/2023   HGBA1C 8.4 (H) 08/04/2023    Review of Glycemic Control  Diabetes history: DM2 Outpatient Diabetes medications: Metformin 1000 mg BID Current orders for Inpatient glycemic control: Jardiance 10 mg daily  Inpatient Diabetes Program Recommendations:   Noted that CBGs have not been checked routinely.   Recommend adding Novolog 0-6 units correction scale TID while in the hospital and to check blood sugars TID.   Smith Mince RN BSN CDE Diabetes Coordinator Pager: [phone]  8am-5pm

## 2023-08-07 NOTE — Progress Notes (Signed)
Mobility Specialist Progress Note:   08/07/23 1600  Oxygen Therapy  O2 Device HFNC  O2 Flow Rate (L/min) 5 L/min  Mobility  Activity Ambulated with assistance in hallway  Level of Assistance +2 (takes two people) Audiological scientist)  Press photographer wheel walker  Distance Ambulated (ft) 90 ft  RUE Weight Bearing Per Provider Order NWB  Activity Response Tolerated well  Mobility Referral Yes  Mobility visit 1 Mobility  Mobility Specialist Start Time (ACUTE ONLY) 1526  Mobility Specialist Stop Time (ACUTE ONLY) 1552  Mobility Specialist Time Calculation (min) (ACUTE ONLY) 26 min    During Mobility:  94% SpO2 Post Mobility: 91% SpO2  Pt received in bed and agreeable. Able to come EOB and stand w/ minA. Had x1 LOB beginning ambulation, corrected w/ minA. Daughter assisted in pushing IV Pole, so MS could have both hands on pt. Presented w/ R lateral lean this date. No complaints throughout. Pt had successful void on BSC at EOS. MS assisted in peri care. Pt left in bed with call bell and all needs met. Bed alarm on and family present.  D'Vante Earlene Plater Mobility Specialist Please contact via Special educational needs teacher or Rehab office at [phone]

## 2023-08-07 NOTE — Progress Notes (Addendum)
Progress Note  Patient Name: Kevin Frank Date of Encounter: 08/07/2023  Primary Cardiologist: Dina Rich, MD  Subjective   Feeling well, no complaints. No fevers, chills, SOB. Remains in AFL 90s-120s.  Inpatient Medications    Scheduled Meds:  allopurinol  300 mg Oral Daily   apixaban  5 mg Oral BID   aspirin  81 mg Oral Daily   atorvastatin  80 mg Oral Daily   Chlorhexidine Gluconate Cloth  6 each Topical Daily   clopidogrel  75 mg Oral Daily   empagliflozin  10 mg Oral Daily   fluticasone furoate-vilanterol  1 puff Inhalation Daily   And   umeclidinium bromide  1 puff Inhalation Daily   losartan  25 mg Oral Daily   nicotine  21 mg Transdermal Daily   sertraline  50 mg Oral Daily   sodium chloride flush  3 mL Intravenous Q12H   torsemide  20 mg Oral Daily   Continuous Infusions:  amiodarone 30 mg/hr (08/06/23 1945)   PRN Meds: acetaminophen, ondansetron (ZOFRAN) IV, sodium chloride flush   Vital Signs    Vitals:   08/06/23 2121 08/06/23 2311 08/07/23 0405 08/07/23 0827  BP: (!) 136/104 (!) 149/90 (!) 142/82 (!) 147/84  Pulse: 94 (!) 101 99 94  Resp:  20 18 (!) 22  Temp:  98.1 F (36.7 C) 98.2 F (36.8 C) 98.6 F (37 C)  TempSrc:  Oral Oral Oral  SpO2:  95% 93%   Weight:      Height:        Intake/Output Summary (Last 24 hours) at 08/07/2023 0840 Last data filed at 08/07/2023 0700 Gross per 24 hour  Intake 649.31 ml  Output 2825 ml  Net -2175.69 ml      08/06/2023    6:57 PM 08/04/2023   10:38 AM 07/09/2023   11:25 AM  Last 3 Weights  Weight (lbs) 213 lb 9.6 oz 220 lb 0.3 oz 219 lb 12.8 oz  Weight (kg) 96.888 kg 99.8 kg 99.7 kg     Telemetry    Atrial flutter 90s-120s - Personally Reviewed  Physical Exam   GEN: No acute distress.  HEENT: Normocephalic, atraumatic, sclera non-icteric. Neck: No JVD or bruits. Cardiac: irregular, borderline tachycardia, no murmurs, rubs, or gallops.  Respiratory: Diffusely diminished to auscultation  bilaterally. Breathing is unlabored. GI: Soft, nontender, non-distended, BS +x 4. MS: no deformity. Extremities: No clubbing or cyanosis. No edema. Distal pedal pulses are 2+ and equal bilaterally. Right radial cath site without hematoma or ecchymosis; good pulse. Neuro:  AAOx3. Follows commands. Psych:  Responds to questions appropriately with a normal affect.  Labs    High Sensitivity Troponin:   Recent Labs  Lab 08/04/23 1108 08/04/23 1404  TROPONINIHS 1,030* 17,294*      Cardiac EnzymesNo results for input(s): "TROPONINI" in the last 168 hours. No results for input(s): "TROPIPOC" in the last 168 hours.   Chemistry Recent Labs  Lab 08/04/23 1108 08/04/23 1110 08/05/23 0305 08/06/23 0214 08/07/23 0448  NA 138   < > 140 138 136  K 2.8*   < > 5.2* 4.4 3.8  CL 97*   < > 102 103 98  CO2 27   < > 25 23 27   GLUCOSE 268*   < > 178* 149* 184*  BUN 12   < > 15 23 21   CREATININE 1.29*   < > 1.30* 1.27* 1.20  CALCIUM 8.2*   < > 9.1 8.6* 8.7*  PROT 5.6*  --   --   --   --  ALBUMIN 2.9*  --   --   --   --   AST 44*  --   --   --   --   ALT 26  --   --   --   --   ALKPHOS 67  --   --   --   --   BILITOT 0.8  --   --   --   --   GFRNONAA 55*   < > 55* 56* >60  ANIONGAP 14   < > 13 12 11    < > = values in this interval not displayed.     Hematology Recent Labs  Lab 08/04/23 1108 08/04/23 1110 08/05/23 0305 08/07/23 0448  WBC 15.5*  --  14.2* 13.6*  RBC 4.25  --  4.46 4.47  HGB 13.4 13.6 13.9 13.6  HCT 38.4* 40.0 41.1 41.6  MCV 90.4  --  92.2 93.1  MCH 31.5  --  31.2 30.4  MCHC 34.9  --  33.8 32.7  RDW 13.7  --  13.8 13.6  PLT 193  --  188 169    BNPNo results for input(s): "BNP", "PROBNP" in the last 168 hours.   DDimer No results for input(s): "DDIMER" in the last 168 hours.   Radiology    No results found.  Cardiac Studies   2d echo 08/05/23  1. Left ventricular ejection fraction, by estimation, is 60 to 65%. The  left ventricle has normal function. The  left ventricle has no regional  wall motion abnormalities. There is mild concentric left ventricular  hypertrophy. Left ventricular diastolic  parameters are consistent with Grade II diastolic dysfunction  (pseudonormalization). Elevated left ventricular end-diastolic pressure.   2. Right ventricular systolic function is normal. The right ventricular  size is normal. There is normal pulmonary artery systolic pressure. The  estimated right ventricular systolic pressure is 30.8 mmHg.   3. The mitral valve is normal in structure. Mild mitral valve  regurgitation.   4. The aortic valve is tricuspid. Aortic valve regurgitation is not  visualized. Aortic valve sclerosis/calcification is present, without any  evidence of aortic stenosis.   5. The inferior vena cava is dilated in size with <50% respiratory  variability, suggesting right atrial pressure of 15 mmHg.    Cath 08/04/23    Prox RCA to Mid RCA lesion is 99% stenosed.   A stent was successfully placed.   Post intervention, there is a 0% residual stenosis.   1.  99% mid right coronary artery lesion requiring complex PCI with multiple wires and GuideLiner with 3 overlapping drug-eluting stents.  The case was complicated by transient need for norepinephrine and vasopressin.  The patient received bicarbonate and Lasix 10 mg IV during the procedure.  All pressors were weaned at the conclusion of the procedure. 2.  Mild left-sided disease. 3.  Significant upper extremity tortuosity requiring destination sheath. 4.  LVEDP of 25 mmHg at the conclusion of the procedure and after Lasix had and administered. 5.  Due to inability to take p.o. during the procedure, the patient was treated with a cangrelor drip.  Transition to [facility] will be managed by pharmacy.   Recommendation: Dual antiplatelet therapy for 1 year and aggressive treatment of cardiovascular risk factors.  Patient Profile     83 y.o. male with COPD with chronic respiratory  failure on home oxygen, lung cancer, sleep apnea, on chronic 5L O2 with home CPAP, chronic HFpEF, coronary atherosclerosis on CT with prior negative nuc 2021,  DM, AAA s/p repair followed by vascular surgery who was admitted with an inferior STEMI on 2/112/5. RCA with severe proximal to mid vessel stenosis treated with 3 overlapping drug eluting stents. Went into new onset atrial flutter on 08/06/23.  Assessment & Plan    1. CAD/Inferior STEMI initially complicated by bradycardia/hypotension now resolved - s/p DESx3 to RCA - started on DAPT with ASA + Plavix unable to afford Brilinta, will need clarification on DAPT plan given new onset AF - continue atorvastatin (home med, LDL 34)  2. Chronic HFpEF - 2D echo EF 60-65%, G2DD, elevated LVEDP but not felt to be volume overloaded - pharmD concerned that SGLT2i may be cost prohibitive - home med regimen included amlodipine 10mg  daily, hydralazine 100mg  BID, losartan 100mg  daily, Toprol 25mg  daily, torsemide 20mg  daily - currently on Jardiance 10mg  daily, losartan 25mg  daily, torsemide 20mg  daily - will review med plan with MD given SBP 140s  3. COPD, asthma with chronic respiratory failure on home O2 - intended to be on home O2 but son reports patient does not use this regularly, only was using QHS - tobacco cessation encouraged - will need reassessment of O2 needs prior to DC for updated eval  4. Leukocytosis - no fever or localizing symptoms, downtrending - CXR, UA ordered  5. Sleep apnea - continue CPAP QHS   6. New onset atrial flutter - started on Eliquis 5mg  BID and IV amiodarone 2/13 - add baseline LFTs, TSH - will review med plan as above with MD - note hypotension/brady in setting of inferior MI initially, now tachycardic (on Toprol 25mg  PTA not on this here)  7. Diabetes mellitus - holding metformin post cath, anticipate resume at DC  8. AAA s/p repair - continue OP f/u vascular surgery   For questions or updates, please  contact [facility] Please consult [email or web address] for contact info under Cardiology/STEMI.  Signed, Laurann Montana, PA-C 08/07/2023, 8:40 AM     I have personally seen and examined this patient. I agree with the assessment and plan as outlined above.  Pt is doing well today. No complaints.  Atrial flutter on telemetry with rates 80-115 bpm Telemetry reviewed by me Labs reviewed by me My exam: NAD ZO:XWRUE irreg Lungs: clear bilaterally Ext: No LE edema Plan:  CAD/Inferior STEMI: Continue ASA and Plavix for one month then stop ASA. Continue statin HFpEF: No volume overload on exam. LVEF normal. Continue Jardiance, Losartan and resume beta blocker.  Atrial flutter: Rate is better controlled. Will plan to continue loading with IV amiodarone today and restart beta blocker. Hopeful he will convert to sinus today.   Verne Carrow, MD, FACc 08/07/2023 10:20 AM

## 2023-08-07 NOTE — Progress Notes (Signed)
Received phone call from Kevin Frank, Kevin Frank re: increasing leukocytosis and UA positive for nitrites with few bacteria present and WBC >50. He would like to treat for suspected UTI with Rocephin 1gm IV q24h. UA should reflex to culture with WBC>50 and epithelial cells <5  Per Kevin Alanis, PA "No signs of sepsis clinically yet. Afebrile and normotensive. Deferring blood or urine cultures for now. Evaluate tomorrow morning. Consider TRH consult in a.m. if worsens. Also stopped empagliflozin with suspected UTI"  Christoper Fabian, PharmD, BCPS Please see amion for complete clinical pharmacist phone list 08/07/2023 9:17 PM

## 2023-08-07 NOTE — Plan of Care (Signed)

## 2023-08-07 NOTE — Progress Notes (Signed)
   08/07/23 0000  BiPAP/CPAP/SIPAP  Reason BIPAP/CPAP not in use Other(comment) (refused tonight)   Pt refused tonight

## 2023-08-07 NOTE — Progress Notes (Signed)
Called to room by patient/family requesting assistance to move from chair to bed.  Patient was markedly fatigued and SOB with minimal exertion.  Also noted atrial fibrillation with sustained rate over 120, BP elevated over baseline at 154/80 and increased generalized and facial edema suggesting fluid volume overload.  Patient has been urinating frequently throughout the day, however.  Secure message to Yvonna Alanis, PA-C to make aware; requested rounding for interventional suggestions.  Will continue to monitor closely.

## 2023-08-07 NOTE — Progress Notes (Signed)
CARDIAC REHAB PHASE I    Pt resting in bed, feeling well today. Pt has active cough and on 5L [place]. Pt son reports pt does not use O2 at home regularly, only while sleeping. Ambulated with PT yesterday, tolerating well with O2. Notified PA of potential need  for additional O2 needs at home related to portable O2 options. Will ask RN/ mobility team to assist with mobility later today.  Post MI/stent education including restrictions, risk factors, exercise guidelines, antiplatelet therapy importance, MI booklet, NTG use, heart healthy diabetic diet, smoking cessation and CRP2 reviewed. All questions and concerns addressed. Will refer to [facility] for CRP2. Mobility needs are complex, will defer to nursing staff/PT/OT/ mobility team. CRP1 education completed. CRP1 will sign off today.   [id number] Woodroe Chen, RN BSN 08/07/2023 9:32 AM

## 2023-08-08 DIAGNOSIS — N3000 Acute cystitis without hematuria: Secondary | ICD-10-CM | POA: Diagnosis not present

## 2023-08-08 DIAGNOSIS — I2111 ST elevation (STEMI) myocardial infarction involving right coronary artery: Secondary | ICD-10-CM | POA: Diagnosis not present

## 2023-08-08 LAB — CBC WITH DIFFERENTIAL/PLATELET
Abs Immature Granulocytes: 0.14 10*3/uL — ABNORMAL HIGH (ref 0.00–0.07)
Basophils Absolute: 0.1 10*3/uL (ref 0.0–0.1)
Basophils Relative: 0 %
Eosinophils Absolute: 0 10*3/uL (ref 0.0–0.5)
Eosinophils Relative: 0 %
HCT: 41.6 % (ref 39.0–52.0)
Hemoglobin: 14.3 g/dL (ref 13.0–17.0)
Immature Granulocytes: 1 %
Lymphocytes Relative: 5 %
Lymphs Abs: 1.2 10*3/uL (ref 0.7–4.0)
MCH: 31.6 pg (ref 26.0–34.0)
MCHC: 34.4 g/dL (ref 30.0–36.0)
MCV: 91.8 fL (ref 80.0–100.0)
Monocytes Absolute: 1.7 10*3/uL — ABNORMAL HIGH (ref 0.1–1.0)
Monocytes Relative: 7 %
Neutro Abs: 21.8 10*3/uL — ABNORMAL HIGH (ref 1.7–7.7)
Neutrophils Relative %: 87 %
Platelets: 187 10*3/uL (ref 150–400)
RBC: 4.53 MIL/uL (ref 4.22–5.81)
RDW: 13.5 % (ref 11.5–15.5)
WBC: 24.9 10*3/uL — ABNORMAL HIGH (ref 4.0–10.5)
nRBC: 0 % (ref 0.0–0.2)

## 2023-08-08 LAB — BLOOD GAS, ARTERIAL
Acid-Base Excess: 9.1 mmol/L — ABNORMAL HIGH (ref 0.0–2.0)
Bicarbonate: 34.2 mmol/L — ABNORMAL HIGH (ref 20.0–28.0)
O2 Saturation: 98.6 %
Patient temperature: 36.8
pCO2 arterial: 47 mm[Hg] (ref 32–48)
pH, Arterial: 7.47 — ABNORMAL HIGH (ref 7.35–7.45)
pO2, Arterial: 142 mm[Hg] — ABNORMAL HIGH (ref 83–108)

## 2023-08-08 LAB — BASIC METABOLIC PANEL
Anion gap: 13 (ref 5–15)
BUN: 22 mg/dL (ref 8–23)
CO2: 24 mmol/L (ref 22–32)
Calcium: 8.5 mg/dL — ABNORMAL LOW (ref 8.9–10.3)
Chloride: 99 mmol/L (ref 98–111)
Creatinine, Ser: 1.29 mg/dL — ABNORMAL HIGH (ref 0.61–1.24)
GFR, Estimated: 55 mL/min — ABNORMAL LOW (ref >60)
Glucose, Bld: 176 mg/dL — ABNORMAL HIGH (ref 70–99)
Potassium: 3.8 mmol/L (ref 3.5–5.1)
Sodium: 136 mmol/L (ref 135–145)

## 2023-08-08 LAB — GLUCOSE, CAPILLARY
Glucose-Capillary: 166 mg/dL — ABNORMAL HIGH (ref 70–99)
Glucose-Capillary: 201 mg/dL — ABNORMAL HIGH (ref 70–99)
Glucose-Capillary: 156 mg/dL — ABNORMAL HIGH (ref 70–99)

## 2023-08-08 LAB — HEPATIC FUNCTION PANEL
ALT: 24 U/L (ref 0–44)
AST: 18 U/L (ref 15–41)
Albumin: 3 g/dL — ABNORMAL LOW (ref 3.5–5.0)
Alkaline Phosphatase: 59 U/L (ref 38–126)
Bilirubin, Direct: 0.2 mg/dL (ref 0.0–0.2)
Indirect Bilirubin: 0.5 mg/dL (ref 0.3–0.9)
Total Bilirubin: 0.7 mg/dL (ref 0.0–1.2)
Total Protein: 6.5 g/dL (ref 6.5–8.1)

## 2023-08-08 LAB — AMMONIA: Ammonia: 28 umol/L (ref 9–35)

## 2023-08-08 MED ORDER — METOPROLOL SUCCINATE ER 25 MG PO TB24
25.0000 mg | ORAL_TABLET | Freq: Two times a day (BID) | ORAL | Status: DC
  Administered 2023-08-08 – 2023-08-09 (×2): 25 mg via ORAL
  Filled 2023-08-08 (×2): qty 1

## 2023-08-08 MED ORDER — INSULIN ASPART 100 UNIT/ML IJ SOLN
0.0000 [IU] | Freq: Three times a day (TID) | INTRAMUSCULAR | Status: DC
  Administered 2023-08-08 – 2023-08-11 (×8): 2 [IU] via SUBCUTANEOUS

## 2023-08-08 MED ORDER — BISACODYL 5 MG PO TBEC
10.0000 mg | DELAYED_RELEASE_TABLET | Freq: Once | ORAL | Status: AC
  Administered 2023-08-08: 10 mg via ORAL
  Filled 2023-08-08: qty 2

## 2023-08-08 MED ORDER — DIPHENHYDRAMINE HCL 25 MG PO CAPS
25.0000 mg | ORAL_CAPSULE | Freq: Once | ORAL | Status: AC
  Administered 2023-08-08: 25 mg via ORAL
  Filled 2023-08-08: qty 1

## 2023-08-08 NOTE — Progress Notes (Signed)
 Patient ambulated 539ft in hallway on O2 at 6L/min with continuous SPO2 monitor.  Steady gait with front-wheel walker; no report of dizziness/lightheadedness, SOB or fatigue.  Maintained SPO2 88-90%.  Brisk recovery of O2 saturation to 91-94% on O2 at 4L/min at rest at bedside.  Tolerated mobility well.  Noted for continuity of care.

## 2023-08-08 NOTE — Consult Note (Signed)
 Initial Consultation Note   Patient: Kevin Frank ZOX:[id number] DOB: 10-Feb-1941 PCP: Elfredia Nevins, MD DOA: 08/04/2023 DOS: the patient was seen and examined on 08/08/2023 Primary service: Antoine Poche, MD  Referring physician: Dr. Wyline Mood Reason for consult: UTI/encephalopathy  Assessment/Plan: Assessment and Plan: No notes have been filed under this hospital service. Service: Hospitalist  1.CAD/inferior STEMI - complicated by transient bradycardia, hypotension - DES x 3 to RCA - medical therapy with ASA/plavix/atorvastatin 80 /toprol 25/losartan 25 - note with new aflutter on triple therapy ASA/plavix/eliquis, cardiology would plan for 1 month then just eliquis and plavix.  Management per cardiology.   2.Chronic HFpEF - 2D echo EF 60-65%, G2DD  - he is on torsemide 20mg  daily. SGLT2i stopped due to UTI this admission Management per cardiology.   3. Elevated WBC/Probable UTI: UA consistent with UTI, although patient has no urinary complaints but he was encephalopathic and white cells are rising and with him being a male, this is highly suspicious for true UTI.  Patient has been started on Rocephin which I will continue, urine culture is ordered but still needs to be collected for some reason.   4. Aflutter/afib -new diagnosis this admission, management per primary team/cardiology.   5. AMS/delirium - likely secondary to UTI also he did get some xanax at 0300 this AM, reportedly early morning, he was lethargic but when I saw him around 11:30 AM today, he was fully alert and oriented and getting ready to eat his lunch.  Daughter was at the bedside and confirmed that he is much better.  Patient was fully coherent.   6. Lip/oral swelling Resolved and this was transient.  TRH will continue to follow the patient.  HPI: Kevin Frank is a 83 y.o. 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 RCA with severe proximal to mid vessel stenosis treated with 3 overlapping drug eluting stents. Went into new onset atrial flutter on 08/06/23.  Hospitalist service was consulted 08/08/2023 due to rising creatinine, encephalopathy and possible UTI management.  Patient seen and examined at the bedside, daughter at the bedside.  Patient was fully coherent, starting to eat breakfast.  Very hard of hearing.  Patient had no complaints at all.  Review of Systems: As mentioned in the history of present illness. All other systems reviewed and are negative. 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    Normal nuclear stress test 2011   Obesity    Obstructive sleep apnea on CPAP    Presence of indwelling urinary catheter    Shortness of breath    due to cigarette abuse   Tobacco abuse    Past Surgical History:  Procedure Laterality Date  ABDOMINAL AORTIC ENDOVASCULAR FENESTRATED STENT GRAFT N/A 04/09/2016   Procedure: ABDOMINAL AORTIC ENDOVASCULAR FENESTRATED STENT GRAFT with left brachial artery access using ultrasound guidance;  Surgeon: Nada Libman, MD;  Location[facility] OR;  Service: Vascular;  Laterality: N/A;   CHOLECYSTECTOMY  1997   [facility]   COLONOSCOPY  2006   2006: multiple diminutive polyps in rectum, one pedunculated polyp at 10 cm s/p snare removal. Left-sided diverticulum, pedunculated polyps at  35 cm and splenic flexure s/p snare removal. No path available.    COLONOSCOPY WITH PROPOFOL N/A 11/24/2019   Procedure: COLONOSCOPY WITH PROPOFOL;  Surgeon: Corbin Ade, MD;  Location: [facility] ENDO SUITE;  Service: Endoscopy;  Laterality: N/A;  9:45am   CORONARY/GRAFT ACUTE MI REVASCULARIZATION N/A 08/04/2023   Procedure: Coronary/Graft Acute MI Revascularization;  Surgeon: Orbie Pyo, MD;  Location: [facility] INVASIVE CV LAB;  Service: Cardiovascular;  Laterality: N/A;   PERIPHERAL VASCULAR CATHETERIZATION N/A 04/10/2016   Procedure: Renal Angiography;  Surgeon: Chuck Hint, MD;  Location[facility] INVASIVE CV LAB;  Service: Cardiovascular;  Laterality: N/A;   POLYPECTOMY  11/24/2019   Procedure: POLYPECTOMY;  Surgeon: Corbin Ade, MD;  Location: [facility] ENDO SUITE;  Service: Endoscopy;;   TRANSURETHRAL RESECTION OF PROSTATE  01/07/2011   Procedure: TRANSURETHRAL RESECTION OF THE PROSTATE (TURP);  Surgeon: Ky Barban;  Location: [facility] ORS;  Service: Urology;  Laterality: N/A;   Social History:  reports that he has been smoking cigarettes. He has a 100 pack-year smoking history. He has never been exposed to tobacco smoke. He has never used smokeless tobacco. He reports current alcohol use. He reports that he does not use drugs.  No Active Allergies  Family History  Problem Relation Age of Onset   Liver disease Father    Lymphoma Mother    Diabetes Mother    Hypertension Mother    Cancer Mother    Mental retardation Other        sibling -- from birth    Asthma Daughter    Allergic rhinitis Neg Hx    Angioedema Neg Hx    Atopy Neg Hx    Eczema Neg Hx    Immunodeficiency Neg Hx    Colon cancer Neg Hx     Prior to Admission medications   Medication Sig Start Date End Date Taking? Authorizing Provider  albuterol (PROVENTIL) (2.5 MG/3ML) 0.083% nebulizer solution Take 3 mLs (2.5 mg total) by nebulization every 6 (six) hours as needed for wheezing or shortness of breath. 06/25/22  Yes  Alfonse Spruce, MD  albuterol (VENTOLIN HFA) 108 (90 Base) MCG/ACT inhaler Use 4 puffs every 4-6 hours as needed for cough or wheeze 05/01/23  Yes Alfonse Spruce, MD  allopurinol (ZYLOPRIM) 300 MG tablet Take 1 tablet (300 mg total) by mouth daily. 09/07/19  Yes Corum, Minerva Fester, MD  amLODipine (NORVASC) 10 MG tablet Take 1 tablet (10 mg total) by mouth daily. 09/06/19  Yes Rosalio Macadamia, NP  atorvastatin (LIPITOR) 40 MG tablet Take 1 tablet (40 mg total) by mouth daily. 04/22/23  Yes BranchDorothe Pea, MD  clopidogrel (PLAVIX) 75 MG tablet Take 1 tablet (75 mg total) by mouth daily. 09/07/19  Yes Corum, Minerva Fester, MD  Fluticasone-Umeclidin-Vilant (TRELEGY ELLIPTA) 100-62.5-25 MCG/ACT AEPB Inhale 1 puff into the lungs daily.   Yes [provider]  hydrALAZINE (APRESOLINE) 100 MG tablet Take 1 tablet (100 mg total) by mouth 2 (two) times daily. 04/22/23  Yes BranchDorothe Pea, MD  losartan (COZAAR) 100 MG tablet TAKE 1 TABLET BY MOUTH ONCE DAILY. 10/10/19  Yes Corum, Minerva Fester, MD  metFORMIN (GLUCOPHAGE) 500 MG tablet Take 1,000 mg by mouth 2 (two) times daily. 01/29/23  Yes [provider]  metoprolol succinate (TOPROL-XL) 25 MG 24 hr tablet Take 1 tablet (25 mg total) by mouth daily. 09/06/19  Yes Rosalio Macadamia, NP  sertraline (ZOLOFT) 50 MG tablet Take 1 tablet (50 mg total) by mouth daily. 08/29/19  Yes Corum, Minerva Fester, MD  torsemide (DEMADEX) 20 MG tablet Take 1 tablet (20 mg total) by mouth daily. 12/13/20  Yes Johnson, Clanford L, MD  Cobalamin Combinations (NEURIVA PLUS) CAPS Take 1 tablet by mouth daily.    [provider]  EPINEPHrine 0.3 mg/0.3 mL IJ SOAJ injection Inject 0.3 mg into the muscle as needed for anaphylaxis. 01/09/23   Alfonse Spruce, MD    Physical Exam: Vitals:   08/07/23 2304 08/08/23 0413 08/08/23 0733 08/08/23 0838  BP: 117/69 110/65  124/73  Pulse: 97 100  95  Resp: 18 20  16   Temp: 98.6 F (37 C) 98.1 F (36.7 C)  98.3 F (36.8  C)  TempSrc: Oral Oral  Oral  SpO2: 96% 95% 96% 97%  Weight:      Height:        Data Reviewed:   There are no new results to review at this time.    Family Communication: Daughter at bedside Primary team communication: yes Thank you very much for involving [place] in the care of your patient.  Author: Hughie Closs, MD 08/08/2023 11:29 AM  For on call review [email or web address].

## 2023-08-08 NOTE — Progress Notes (Addendum)
 Progress Note  Patient Name: Kevin Frank Date of Encounter: 08/08/2023  Primary Cardiologist: Dina Rich, MD  Subjective   Events of last evening reviewed in chart and also spoke with wife.   Yesterday morning the patient was awake, alert, and in good spirits, asking to go home, WBC downtrending, CXR without acute disease. In the evening he developed worsening fatigue per nurse notes along with facial edema. Wife reports he seemed more confused and had facial swelling. On Call APP repeated CBC and UA with rising WBC and evidence of UTI, recommended initiation of Rocephin. Facial swelling spontaneously resolved overnight. Wife reports he had a restless night and was given melatonin without relief, then Xanax a few hours ago which wife reports really knocked him out. He is very sleepy but easily arousable, oriented to self, wife, place, month, but falls back asleep quickly.  Wife also raises concern over right big toenail - recently fell off when she tried to clip this. D/w Dr. Wyline Mood. We will consult medicine.  Inpatient Medications    Scheduled Meds:  allopurinol  300 mg Oral Daily   apixaban  5 mg Oral BID   aspirin  81 mg Oral Daily   atorvastatin  80 mg Oral Daily   Chlorhexidine Gluconate Cloth  6 each Topical Daily   clopidogrel  75 mg Oral Daily   fluticasone furoate-vilanterol  1 puff Inhalation Daily   And   umeclidinium bromide  1 puff Inhalation Daily   losartan  25 mg Oral Daily   melatonin  3 mg Oral QHS   metoprolol succinate  25 mg Oral Daily   nicotine  21 mg Transdermal Daily   sertraline  50 mg Oral Daily   sodium chloride flush  3 mL Intravenous Q12H   torsemide  20 mg Oral Daily   Continuous Infusions:  amiodarone 30 mg/hr (08/08/23 0932)   cefTRIAXone (ROCEPHIN)  IV 1 g (08/07/23 2330)   PRN Meds: acetaminophen, ALPRAZolam, alum & mag hydroxide-simeth, guaiFENesin-dextromethorphan, hydrocortisone cream, loperamide, magnesium hydroxide,  ondansetron (ZOFRAN) IV, sodium chloride flush   Vital Signs    Vitals:   08/07/23 1947 08/07/23 2304 08/08/23 0413 08/08/23 0838  BP: 130/86 117/69 110/65 124/73  Pulse: (!) 102 97 100 95  Resp: 18 18 20 16   Temp: 98.3 F (36.8 C) 98.6 F (37 C) 98.1 F (36.7 C) 98.3 F (36.8 C)  TempSrc: Oral Oral Oral Oral  SpO2: 95% 96% 95% 97%  Weight:      Height:        Intake/Output Summary (Last 24 hours) at 08/08/2023 0937 Last data filed at 08/08/2023 0653 Gross per 24 hour  Intake 240 ml  Output 1800 ml  Net -1560 ml      08/06/2023    6:57 PM 08/04/2023   10:38 AM 07/09/2023   11:25 AM  Last 3 Weights  Weight (lbs) 213 lb 9.6 oz 220 lb 0.3 oz 219 lb 12.8 oz  Weight (kg) 96.888 kg 99.8 kg 99.7 kg     Telemetry    AF 90s-120s - Personally Reviewed  Physical Exam   GEN: No acute distress.  HEENT: Normocephalic, atraumatic, sclera non-icteric. Neck: No JVD or bruits. Cardiac: irregularly irregular, rates borderline controlled, no murmurs, rubs, or gallops.  Respiratory: Clear to auscultation bilaterally. Breathing is unlabored. GI: Soft, nontender, non-distended, BS +x 4. MS: no deformity. Extremities: No clubbing or cyanosis. No significant edema. R great toe has callous formation over toenail bed; will ask IM to eval  Neuro:  A+O to self, place, wife, falls back asleep easily, otherwise no focal deficits Psych:  Lethargic but responds with appropriate affect when awakened  Labs    High Sensitivity Troponin:   Recent Labs  Lab 08/04/23 1108 08/04/23 1404  TROPONINIHS 1,030* 17,294*      Cardiac EnzymesNo results for input(s): "TROPONINI" in the last 168 hours. No results for input(s): "TROPIPOC" in the last 168 hours.   Chemistry Recent Labs  Lab 08/04/23 1108 08/04/23 1110 08/06/23 0214 08/07/23 0448 08/07/23 1133 08/08/23 0425  NA 138   < > 138 136  --  136  K 2.8*   < > 4.4 3.8  --  3.8  CL 97*   < > 103 98  --  99  CO2 27   < > 23 27  --  24   GLUCOSE 268*   < > 149* 184*  --  176*  BUN 12   < > 23 21  --  22  CREATININE 1.29*   < > 1.27* 1.20  --  1.29*  CALCIUM 8.2*   < > 8.6* 8.7*  --  8.5*  PROT 5.6*  --   --   --  7.3  --   ALBUMIN 2.9*  --   --   --  3.5  --   AST 44*  --   --   --  30  --   ALT 26  --   --   --  33  --   ALKPHOS 67  --   --   --  67  --   BILITOT 0.8  --   --   --  0.9  --   GFRNONAA 55*   < > 56* >60  --  55*  ANIONGAP 14   < > 12 11  --  13   < > = values in this interval not displayed.     Hematology Recent Labs  Lab 08/07/23 0448 08/07/23 2015 08/08/23 0425  WBC 13.6* 23.2* 24.9*  RBC 4.47 4.72 4.53  HGB 13.6 14.8 14.3  HCT 41.6 43.3 41.6  MCV 93.1 91.7 91.8  MCH 30.4 31.4 31.6  MCHC 32.7 34.2 34.4  RDW 13.6 13.5 13.5  PLT 169 191 187    BNPNo results for input(s): "BNP", "PROBNP" in the last 168 hours.   DDimer No results for input(s): "DDIMER" in the last 168 hours.   Radiology    DG Chest 2 View Result Date: 08/07/2023 CLINICAL DATA:  Leukocytosis. EXAM: CHEST - 2 VIEW COMPARISON:  05/26/2023 and chest CT 07/02/2023 FINDINGS: Heart size is upper limits of normal. Coarse lung markings likely represent chronic changes. Patchy densities at the medial right upper lung are similar to the prior chest CT. Atherosclerotic calcifications at the aortic arch. No large pleural effusions. IMPRESSION: Chronic lung changes without acute findings. Electronically Signed   By: Richarda Overlie M.D.   On: 08/07/2023 12:06    Cardiac Studies   2d echo 08/05/23  1. Left ventricular ejection fraction, by estimation, is 60 to 65%. The  left ventricle has normal function. The left ventricle has no regional  wall motion abnormalities. There is mild concentric left ventricular  hypertrophy. Left ventricular diastolic  parameters are consistent with Grade II diastolic dysfunction  (pseudonormalization). Elevated left ventricular end-diastolic pressure.   2. Right ventricular systolic function is normal.  The right ventricular  size is normal. There is normal pulmonary artery systolic pressure. The  estimated  right ventricular systolic pressure is 30.8 mmHg.   3. The mitral valve is normal in structure. Mild mitral valve  regurgitation.   4. The aortic valve is tricuspid. Aortic valve regurgitation is not  visualized. Aortic valve sclerosis/calcification is present, without any  evidence of aortic stenosis.   5. The inferior vena cava is dilated in size with <50% respiratory  variability, suggesting right atrial pressure of 15 mmHg.     Cath 08/04/23    Prox RCA to Mid RCA lesion is 99% stenosed.   A stent was successfully placed.   Post intervention, there is a 0% residual stenosis.   1.  99% mid right coronary artery lesion requiring complex PCI with multiple wires and GuideLiner with 3 overlapping drug-eluting stents.  The case was complicated by transient need for norepinephrine and vasopressin.  The patient received bicarbonate and Lasix 10 mg IV during the procedure.  All pressors were weaned at the conclusion of the procedure. 2.  Mild left-sided disease. 3.  Significant upper extremity tortuosity requiring destination sheath. 4.  LVEDP of 25 mmHg at the conclusion of the procedure and after Lasix had and administered. 5.  Due to inability to take p.o. during the procedure, the patient was treated with a cangrelor drip.  Transition to [facility] will be managed by pharmacy.   Recommendation: Dual antiplatelet therapy for 1 year and aggressive treatment of cardiovascular risk factors.  Patient Profile     83 y.o. 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 RCA with severe proximal to mid vessel stenosis treated with 3 overlapping drug  eluting stents. Went into new onset atrial flutter on 08/06/23.  Assessment & Plan    1. CAD/Inferior STEMI initially complicated by bradycardia/hypotension now resolved - s/p DESx3 to RCA - started on DAPT with ASA + Plavix (unable to afford Brilinta and now on Eliquis as well) -> plan stop ASA after 1 month given triple therapy - continue atorvastatin (home med, LDL 34)   2. Chronic HFpEF - 2D echo EF 60-65%, G2DD, elevated LVEDP but not felt to be volume overloaded - pharmD concerned that SGLT2i may be cost prohibitive, also discontinued 2/14 due to UTI - home med regimen included amlodipine 10mg  daily, hydralazine 100mg  BID, losartan 100mg  daily, Toprol 25mg  daily, torsemide 20mg  daily - currently on losartan 25mg  daily, torsemide 20mg  daily, continue for now   3. COPD, asthma with chronic respiratory failure on home O2 - intended to be on home O2 but son reports patient does not use this regularly, only was using QHS - tobacco cessation encouraged - will need reassessment of O2 needs prior to DC for updated eval   4. Leukocytosis, encephalopathy, facial swelling, right great toenail issue - worsening leukocytosis, confusion, fatigue noted yesterday evening with UA ++ positive for UTI, started on Rocephin - also received Xanax this AM and sleepy but arousable - etiology of reported facial swelling not clear, wife reports completely resolved this AM - will review plans with MD - will ask IM to evaluate given ongoing leukocytosis - will also get LFTs, ammonia, ABG, UCx, blood cx though already on abx now - no acute localizing neuro symptoms, await IM eval whether neuro imaging helpful  5. Sleep apnea - continue CPAP QHS  6. New onset atrial flutter this admission - started on Eliquis 5mg  BID and IV amiodarone 2/13 - add baseline LFTs, TSH - note hypotension/brady in setting of inferior MI initially - Toprol added back 2/14 with HR generally 90s presently, follow in setting of  acute issues above   7. Diabetes mellitus - holding metformin post cath, anticipate resume at DC - CBG checks, SSI   8. AAA s/p repair - continue OP f/u vascular surgery  9. Renal insufficiency - baseline Cr recently appears 1.1 though previous values up to 1.3, 1.4 in the past - follow for now on regimen above    For questions or updates, please contact [facility] Please consult [email or web address] for contact info under Cardiology/STEMI.  Signed, Laurann Montana, PA-C 08/08/2023, 9:37 AM    Attending note  Patient seen and discussed with PA Dunn, I agree with her documentation  1.CAD/inferior STEMI - complicated by transient bradycardia, hypotension - DES x 3 to RCA - medical therapy with ASA/plavix/atorvastatin 80 /toprol 25/losartan 25 - note with new aflutter on triple therapy ASA/plavix/eliquis, would plan for 1 month then just eliquis and plavix.    2.Chronic HFpEF - 2D echo EF 60-65%, G2DD  - he is on torsemide 20mg  daily. SGLT2i stopped due to UTI this admission - appears euvolemic  3. Elevated WBC/Probable UTI - we have consulted internal medicine, currently on ceftriazone  4. Aflutter/afib -new diagnosis this admission, he is on amio gtt -lip swelling unclear is amio related, there is a reported assoc with angioedema. D/c amio, rate control. Will increase toprol to 25mg  bid.   5. AMS - likely secondary to UTI, we have consulted medicine to help evaluation - did get some xanax at 0300 this AM, though lethargy and AMS was prior  6. Lip/oral swelling - started yesterday, improved but family reports some recurrence - of note amio started 2/13. There is a link between amio and angioedema - d/c amio, just rate control at this time.   Dina Rich MD

## 2023-08-08 NOTE — Plan of Care (Signed)

## 2023-08-09 DIAGNOSIS — I2111 ST elevation (STEMI) myocardial infarction involving right coronary artery: Secondary | ICD-10-CM | POA: Diagnosis not present

## 2023-08-09 DIAGNOSIS — I2129 ST elevation (STEMI) myocardial infarction involving other sites: Secondary | ICD-10-CM

## 2023-08-09 LAB — BASIC METABOLIC PANEL
Anion gap: 16 — ABNORMAL HIGH (ref 5–15)
BUN: 32 mg/dL — ABNORMAL HIGH (ref 8–23)
CO2: 25 mmol/L (ref 22–32)
Calcium: 8.7 mg/dL — ABNORMAL LOW (ref 8.9–10.3)
Chloride: 95 mmol/L — ABNORMAL LOW (ref 98–111)
Creatinine, Ser: 1.49 mg/dL — ABNORMAL HIGH (ref 0.61–1.24)
GFR, Estimated: 47 mL/min — ABNORMAL LOW (ref >60)
Glucose, Bld: 172 mg/dL — ABNORMAL HIGH (ref 70–99)
Potassium: 3.6 mmol/L (ref 3.5–5.1)
Sodium: 136 mmol/L (ref 135–145)

## 2023-08-09 LAB — GLUCOSE, CAPILLARY
Glucose-Capillary: 203 mg/dL — ABNORMAL HIGH (ref 70–99)
Glucose-Capillary: 173 mg/dL — ABNORMAL HIGH (ref 70–99)
Glucose-Capillary: 162 mg/dL — ABNORMAL HIGH (ref 70–99)
Glucose-Capillary: 219 mg/dL — ABNORMAL HIGH (ref 70–99)

## 2023-08-09 LAB — CBC WITH DIFFERENTIAL/PLATELET
Abs Immature Granulocytes: 0.07 10*3/uL (ref 0.00–0.07)
Basophils Absolute: 0.1 10*3/uL (ref 0.0–0.1)
Basophils Relative: 0 %
Eosinophils Absolute: 0 10*3/uL (ref 0.0–0.5)
Eosinophils Relative: 0 %
HCT: 43.6 % (ref 39.0–52.0)
Hemoglobin: 14.8 g/dL (ref 13.0–17.0)
Immature Granulocytes: 0 %
Lymphocytes Relative: 11 %
Lymphs Abs: 2 10*3/uL (ref 0.7–4.0)
MCH: 31.4 pg (ref 26.0–34.0)
MCHC: 33.9 g/dL (ref 30.0–36.0)
MCV: 92.4 fL (ref 80.0–100.0)
Monocytes Absolute: 1 10*3/uL (ref 0.1–1.0)
Monocytes Relative: 5 %
Neutro Abs: 15.8 10*3/uL — ABNORMAL HIGH (ref 1.7–7.7)
Neutrophils Relative %: 84 %
Platelets: 196 10*3/uL (ref 150–400)
RBC: 4.72 MIL/uL (ref 4.22–5.81)
RDW: 13.6 % (ref 11.5–15.5)
WBC: 18.9 10*3/uL — ABNORMAL HIGH (ref 4.0–10.5)
nRBC: 0 % (ref 0.0–0.2)

## 2023-08-09 MED ORDER — SODIUM CHLORIDE 0.9 % IV SOLN: INTRAVENOUS | Status: AC

## 2023-08-09 MED ORDER — FAMOTIDINE IN NACL 20-0.9 MG/50ML-% IV SOLN
20.0000 mg | Freq: Once | INTRAVENOUS | Status: AC
  Administered 2023-08-09: 20 mg via INTRAVENOUS
  Filled 2023-08-09: qty 50

## 2023-08-09 MED ORDER — TAMSULOSIN HCL 0.4 MG PO CAPS
0.4000 mg | ORAL_CAPSULE | Freq: Every day | ORAL | Status: DC
  Administered 2023-08-09 – 2023-08-10 (×3): 0.4 mg via ORAL
  Filled 2023-08-09 (×3): qty 1

## 2023-08-09 MED ORDER — FAMOTIDINE 20 MG PO TABS
20.0000 mg | ORAL_TABLET | Freq: Two times a day (BID) | ORAL | Status: DC
  Administered 2023-08-09 – 2023-08-11 (×4): 20 mg via ORAL
  Filled 2023-08-09 (×3): qty 1

## 2023-08-09 MED ORDER — LORATADINE 10 MG PO TABS
10.0000 mg | ORAL_TABLET | Freq: Every day | ORAL | Status: DC
  Administered 2023-08-09 – 2023-08-11 (×3): 10 mg via ORAL
  Filled 2023-08-09 (×3): qty 1

## 2023-08-09 MED ORDER — METOPROLOL SUCCINATE ER 50 MG PO TB24
50.0000 mg | ORAL_TABLET | Freq: Two times a day (BID) | ORAL | Status: DC
  Administered 2023-08-09 – 2023-08-11 (×4): 50 mg via ORAL
  Filled 2023-08-09 (×4): qty 1

## 2023-08-09 NOTE — Progress Notes (Signed)
 Called by nurse for elevated MEWS score.  This is due to heart rate being elevated and white blood count being elevated as well.  His white count today is 18.9 but yesterday it was 24.9 so this is actually an improvement.  His heart rate is elevated today, will adjust meds as his blood pressure will allow and look for causes.  The nurse also mentioned that he had a rash, on his back as well as arms and legs.  He is also having issues with urinary retention.  Spoke with the pharmacist who will review the meds, see what is the most likely to have caused the rash, and come up with a plan to decrease or hold meds in order to reduce symptoms.  He got a one-time dose of Benadryl earlier, but to avoid oversedation, will not continue this prior to seeing the patient.  He may need steroids as well.  Follow-up on rounds.  Theodore Demark, PA-C 08/09/2023 8:48 AM

## 2023-08-09 NOTE — Progress Notes (Signed)
 Rounding Note    Patient Name: Kevin Frank Date of Encounter: 08/09/2023  [facility] Cardiologist: Dina Rich, MD   Subjective   No chest pain or SOB. Developed diffused rash early this AM  Inpatient Medications    Scheduled Meds:  allopurinol  300 mg Oral Daily   apixaban  5 mg Oral BID   aspirin  81 mg Oral Daily   atorvastatin  80 mg Oral Daily   Chlorhexidine Gluconate Cloth  6 each Topical Daily   clopidogrel  75 mg Oral Daily   famotidine  20 mg Oral BID   fluticasone furoate-vilanterol  1 puff Inhalation Daily   And   umeclidinium bromide  1 puff Inhalation Daily   insulin aspart  0-9 Units Subcutaneous TID WC   loratadine  10 mg Oral Daily   losartan  25 mg Oral Daily   melatonin  3 mg Oral QHS   metoprolol succinate  25 mg Oral BID   nicotine  21 mg Transdermal Daily   sertraline  50 mg Oral Daily   sodium chloride flush  3 mL Intravenous Q12H   torsemide  20 mg Oral Daily   Continuous Infusions:  cefTRIAXone (ROCEPHIN)  IV 1 g (08/08/23 2309)   famotidine (PEPCID) IV     PRN Meds: acetaminophen, ALPRAZolam, alum & mag hydroxide-simeth, guaiFENesin-dextromethorphan, hydrocortisone cream, loperamide, magnesium hydroxide, ondansetron (ZOFRAN) IV, sodium chloride flush   Vital Signs    Vitals:   08/09/23 0421 08/09/23 0729 08/09/23 0740 08/09/23 0911  BP: 116/74 122/87  113/61  Pulse: 65 (!) 115 (!) 116 (!) 51  Resp: 16 16    Temp: 98.4 F (36.9 C) 97.9 F (36.6 C)  98.5 F (36.9 C)  TempSrc: Oral Oral  Oral  SpO2:  94% 96% 94%  Weight:      Height:        Intake/Output Summary (Last 24 hours) at 08/09/2023 1135 Last data filed at 08/09/2023 0730 Gross per 24 hour  Intake 700 ml  Output 1720 ml  Net -1020 ml      08/06/2023    6:57 PM 08/04/2023   10:38 AM 07/09/2023   11:25 AM  Last 3 Weights  Weight (lbs) 213 lb 9.6 oz 220 lb 0.3 oz 219 lb 12.8 oz  Weight (kg) 96.888 kg 99.8 kg 99.7 kg      Telemetry     Aflutter/afib rates 110s - Personally Reviewed  ECG    N/a - Personally Reviewed  Physical Exam   GEN: No acute distress.   Neck: No JVD Cardiac: irregular, tachycardic Respiratory: Clear to auscultation bilaterally. GI: Soft, nontender, non-distended  MS: No edema; No deformity. Neuro:  Nonfocal  Psych: Normal affect   Labs    High Sensitivity Troponin:   Recent Labs  Lab 08/04/23 1108 08/04/23 1404  TROPONINIHS 1,030* 17,294*     Chemistry Recent Labs  Lab 08/04/23 1108 08/04/23 1110 08/04/23 1409 08/04/23 1655 08/05/23 0305 08/06/23 0214 08/07/23 0448 08/07/23 1133 08/08/23 0425 08/08/23 1030 08/09/23 0327  NA 138   < > 139  --    < > 138 136  --  136  --  136  K 2.8*   < > 3.3*  --    < > 4.4 3.8  --  3.8  --  3.6  CL 97*  --  95*  --    < > 103 98  --  99  --  95*  CO2 27  --  30  --    < > 23 27  --  24  --  25  GLUCOSE 268*  --  252*  --    < > 149* 184*  --  176*  --  172*  BUN 12  --  11  --    < > 23 21  --  22  --  32*  CREATININE 1.29*  --  1.25*  --    < > 1.27* 1.20  --  1.29*  --  1.49*  CALCIUM 8.2*  --  9.1  --    < > 8.6* 8.7*  --  8.5*  --  8.7*  MG  --   --  1.6* 1.6*  --  2.0  --   --   --   --   --   PROT 5.6*  --   --   --   --   --   --  7.3  --  6.5  --   ALBUMIN 2.9*  --   --   --   --   --   --  3.5  --  3.0*  --   AST 44*  --   --   --   --   --   --  30  --  18  --   ALT 26  --   --   --   --   --   --  33  --  24  --   ALKPHOS 67  --   --   --   --   --   --  67  --  59  --   BILITOT 0.8  --   --   --   --   --   --  0.9  --  0.7  --   GFRNONAA 55*  --  57*  --    < > 56* >60  --  55*  --  47*  ANIONGAP 14  --  14  --    < > 12 11  --  13  --  16*   < > = values in this interval not displayed.    Lipids  Recent Labs  Lab 08/04/23 1108  CHOL 82  TRIG 92  HDL 30*  LDLCALC 34  CHOLHDL 2.7    Hematology Recent Labs  Lab 08/07/23 2015 08/08/23 0425 08/09/23 0327  WBC 23.2* 24.9* 18.9*  RBC 4.72 4.53 4.72  HGB 14.8  14.3 14.8  HCT 43.3 41.6 43.6  MCV 91.7 91.8 92.4  MCH 31.4 31.6 31.4  MCHC 34.2 34.4 33.9  RDW 13.5 13.5 13.6  PLT 191 187 196   Thyroid  Recent Labs  Lab 08/07/23 1133  TSH 0.378    BNPNo results for input(s): "BNP", "PROBNP" in the last 168 hours.  DDimer No results for input(s): "DDIMER" in the last 168 hours.   Radiology    No results found.  Cardiac Studies    Patient Profile     83 y.o. 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 RCA with severe proximal to mid vessel stenosis treated with 3 overlapping drug eluting stents. Went into new onset atrial flutter on 08/06/23.   Assessment &  Plan  1.CAD/inferior STEMI - complicated by transient bradycardia, hypotension - 07/2023 echo: LVEF 60-65%, grad II dd - DES x 3 to RCA - medical therapy with ASA/plavix/atorvastatin 80 /toprol 25/losartan 25 - note with new aflutter on triple therapy ASA/plavix/eliquis, would plan for 1 month then just eliquis and plavix.      2.Chronic HFpEF - 2D echo EF 60-65%, G2DD  - he is on torsemide 20mg  daily. SGLT2i stopped due to UTI this admission - appears euvolemic   3. Elevated WBC/Probable UTI -  UTI, followed by internal medicine consult -he is on ceftriazone   4. Aflutter/afib -new diagnosis this admission - some lip and facial swelling thought related to amio, there is reported association with angioedema. Also rash today noted. Discussed with pharmacy who reviewed meds and med history, felt symptoms most likely related to amiodarone. It has been discontinued.   - continue rate control with toprol 25mg  bid. Rates low 100s, increase dosing to 50mg  bid.  - he is on eliquis for stroke prevention     5. AMS - likely secondary  to UTI - transient, has improved   6. Lip/oral swelling/rash - as reported above, suspect secondary to amiodarone - amio discontinued 08/07/22.  - pharmD has recommended loratadine 10 mg once daily and can also do famotidine 20 mg IV x1 dose and then transition to 20 mg PO BID thereafter  7.Urinary retention - have asked IM to also help manage - Bladder scan performed with 496 ml reflected, pt voided in commode an unmeasured amount.  Post void residual 370 ml.  MD notified of welts/rash and urinary retention.  Order obtained for benadryl to do an I/O cath, bladder scan prior to I/O cath 659 ml, I/O cath done with 659 ml yellow urine obtained.      For questions or updates, please contact [facility] Please consult [email or web address] for contact info under        Signed, Dina Rich, MD  08/09/2023, 11:35 AM

## 2023-08-09 NOTE — Progress Notes (Addendum)
 PROGRESS NOTE    Kevin Frank  ZOX:[id number] DOB: 05/11/1941 DOA: 08/04/2023 PCP: Elfredia Nevins, MD   Brief Narrative:  HPI: Kevin Frank is a 83 y.o. 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 RCA with severe proximal to mid vessel stenosis treated with 3 overlapping drug eluting stents. Went into new onset atrial flutter on 08/06/23.  Hospitalist service was consulted 08/08/2023 due to rising creatinine, encephalopathy and possible UTI management.  Patient seen and examined at the bedside, daughter at the bedside.  Patient was fully coherent, starting to eat breakfast.  Very hard of hearing.  Patient had no complaints at all.   Assessment & Plan:   Principal Problem:   STEMI (ST elevation myocardial infarction) (HCC)  1.CAD/inferior STEMI - complicated by transient bradycardia, hypotension - DES x 3 to RCA - medical therapy with ASA/plavix/atorvastatin 80 /toprol 25/losartan 25 - note with new aflutter on triple therapy ASA/plavix/eliquis, cardiology would plan for 1 month then just eliquis and plavix.  Management per cardiology.   2.Chronic HFpEF - 2D echo EF 60-65%, G2DD  - he is on torsemide 20mg  daily. SGLT2i stopped due to UTI this admission Management per cardiology.   3. Elevated WBC/Probable UTI: UA consistent with UTI, Rocephin, follow culture.  White cells are improving.  He is afebrile.   4. Aflutter/afib -new diagnosis this admission, management per primary team/cardiology.   5. AMS/delirium - likely secondary to UTI and Xanax that he had received early morning of 08/08/2023.  Patient has been alert and oriented during my encounter yesterday and today.   6. Lip/oral swelling Resolved and this was  transient.  7.  Urinary retention/history of BPH: Reports of retention requiring in and out catheter.  Appears to have history of BPH but he was not on any Flomax PTA.  Will start on Flomax and check bladder scan every 8 hours.  8.  Drug rash: Patient has developed maculopapular rash all over the body.  Please see picture below as one of the example.  Possibility of drug rash.  Recently started on amiodarone which cardiology is going to stop.  After consulting with pharmacy, will start on cetirizine and famotidine and watch for next 1 to 2 days.  Benadryl as needed for itching.  9.  AKI: Mild elevation in creatinine, rising.  Start on gentle hydration with 75 cc/h for 12 hours.  Okay with cardiology.    DVT prophylaxis: Eliquis   Code Status: Full Code  Family Communication: Daughter present at bedside.  Plan of care discussed with patient in length and he/she verbalized understanding and agreed with it.  Status is: Inpatient Remains inpatient appropriate because: Discharge per primary attendings discretion.   Estimated body mass index is 30.65 kg/m as calculated from the following:   Height as of this encounter: 5\' 10"  (1.778 m).   Weight as of this encounter: 96.9 kg.    Nutritional Assessment: Body mass index is 30.65 kg/m.[name] Seen by dietician.  I agree with the assessment and plan as outlined below: Nutrition Status:        . Skin Assessment: I have examined the patient's skin and I agree with the wound assessment as performed by the wound care RN as outlined below:    Consultants:  TRH  Procedures:  As above  Antimicrobials:  Anti-infectives (From admission, onward)    Start     Dose/Rate Route Frequency Ordered Stop   08/07/23 2130  cefTRIAXone (ROCEPHIN) 1 g in sodium chloride 0.9 % 100 mL IVPB        1 g 200 mL/hr over 30 Minutes Intravenous Every 24 hours 08/07/23 2123           Subjective: Patient seen and examined.  Patient himself was fully alert  and oriented and had no complaint, no itching either.  Daughter at the bedside was concerned about rash.  Addressed many questions at the bedside.  Objective: Vitals:   08/09/23 0421 08/09/23 0729 08/09/23 0740 08/09/23 0911  BP: 116/74 122/87  113/61  Pulse: 65 (!) 115 (!) 116 (!) 51  Resp: 16 16    Temp: 98.4 F (36.9 C) 97.9 F (36.6 C)  98.5 F (36.9 C)  TempSrc: Oral Oral  Oral  SpO2:  94% 96% 94%  Weight:      Height:        Intake/Output Summary (Last 24 hours) at 08/09/2023 1148 Last data filed at 08/09/2023 0730 Gross per 24 hour  Intake 700 ml  Output 1720 ml  Net -1020 ml   Filed Weights   08/04/23 1038 08/06/23 1857  Weight: 99.8 kg 96.9 kg    Examination:  General exam: Appears calm and comfortable  Respiratory system: Clear to auscultation. Respiratory effort normal. Cardiovascular system: S1 & S2 heard, RRR. No JVD, murmurs, rubs, gallops or clicks. No pedal edema. Gastrointestinal system: Abdomen is nondistended, soft and nontender. No organomegaly or masses felt. Normal bowel sounds heard. Central nervous system: Alert and oriented. No focal neurological deficits. Extremities: Symmetric 5 x 5 power. Skin: Rash as in picture above.   Data Reviewed: I have personally reviewed following labs and imaging studies  CBC: Recent Labs  Lab 08/04/23 1108 08/04/23 1110 08/05/23 0305 08/07/23 0448 08/07/23 2015 08/08/23 0425 08/09/23 0327  WBC 15.5*  --  14.2* 13.6* 23.2* 24.9* 18.9*  NEUTROABS 12.1*  --   --   --  20.1* 21.8* 15.8*  HGB 13.4   < > 13.9 13.6 14.8 14.3 14.8  HCT 38.4*   < > 41.1 41.6 43.3 41.6 43.6  MCV 90.4  --  92.2 93.1 91.7 91.8 92.4  PLT 193  --  188 169 191 187 196   < > = values in this interval not displayed.   Basic Metabolic Panel: Recent Labs  Lab 08/04/23 1409 08/04/23 1655 08/05/23 0305 08/06/23 0214 08/07/23 0448 08/08/23 0425 08/09/23 0327  NA 139  --  140 138 136 136 136  K 3.3*  --  5.2* 4.4 3.8 3.8 3.6  CL  95*  --  102 103 98 99 95*  CO2 30  --  25 23 27 24 25   GLUCOSE 252*  --  178* 149* 184* 176* 172*  BUN 11  --  15 23 21 22  32*  CREATININE 1.25*  --  1.30* 1.27* 1.20 1.29* 1.49*  CALCIUM 9.1  --  9.1 8.6* 8.7* 8.5* 8.7*  MG 1.6* 1.6*  --  2.0  --   --   --    GFR: Estimated Creatinine Clearance: 44.7 mL/min (A) (by C-G formula based on SCr of 1.49 mg/dL (H)). Liver Function Tests: Recent Labs  Lab 08/04/23 1108 08/07/23 1133 08/08/23 1030  AST 44* 30 18  ALT 26 33 24  ALKPHOS 67 67 59  BILITOT 0.8 0.9 0.7  PROT 5.6* 7.3 6.5  ALBUMIN 2.9* 3.5 3.0*   No results for input(s): "LIPASE", "AMYLASE" in the last 168 hours. Recent Labs  Lab 08/08/23 1030  AMMONIA 28   Coagulation Profile: Recent Labs  Lab 08/04/23 1108  INR 1.3*   Cardiac Enzymes: No results for input(s): "CKTOTAL", "CKMB", "CKMBINDEX", "TROPONINI" in the last 168 hours. BNP (last 3 results) No results for input(s): "PROBNP" in the last 8760 hours. HbA1C: No results for input(s): "HGBA1C" in the last 72 hours. CBG: Recent Labs  Lab 08/08/23 1100 08/08/23 1643 08/08/23 2109 08/09/23 0727 08/09/23 1121  GLUCAP 166* 201* 156* 203* 173*   Lipid Profile: No results for input(s): "CHOL", "HDL", "LDLCALC", "TRIG", "CHOLHDL", "LDLDIRECT" in the last 72 hours. Thyroid Function Tests: Recent Labs    08/07/23 1133  TSH 0.378   Anemia Panel: No results for input(s): "VITAMINB12", "FOLATE", "FERRITIN", "TIBC", "IRON", "RETICCTPCT" in the last 72 hours. Sepsis Labs: Recent Labs  Lab 08/04/23 1051 08/04/23 1409 08/04/23 1655  LATICACIDVEN 2.9* 2.0* 1.8    Recent Results (from the past 240 hours)  Culture, blood (Routine X 2) w Reflex to ID Panel     Status: None (Preliminary result)   Collection Time: 08/08/23 10:32 AM   Specimen: BLOOD LEFT ARM  Result Value Ref Range Status   Specimen Description BLOOD LEFT ARM  Final   Special Requests   Final    BOTTLES DRAWN AEROBIC AND ANAEROBIC Blood  Culture results may not be optimal due to an inadequate volume of blood received in culture bottles   Culture   Final    NO GROWTH < 24 HOURS Performed at [facility], [address] [place], [place] [address]    Report Status PENDING  Incomplete  Culture, blood (Routine X 2) w Reflex to ID Panel     Status: None (Preliminary result)   Collection Time: 08/08/23 10:33 AM   Specimen: BLOOD RIGHT ARM  Result Value Ref Range Status   Specimen Description BLOOD RIGHT ARM  Final   Special Requests   Final    BOTTLES DRAWN AEROBIC ONLY Blood Culture results may not be optimal due to an inadequate volume of blood received in culture bottles   Culture   Final    NO GROWTH < 24 HOURS Performed at [facility], [address], [place], [place] [address]    Report Status PENDING  Incomplete     Radiology Studies: No results found.  Scheduled Meds:  allopurinol  300 mg Oral Daily   apixaban  5 mg Oral BID   aspirin  81 mg Oral Daily   atorvastatin  80 mg Oral Daily   Chlorhexidine Gluconate Cloth  6 each Topical Daily   clopidogrel  75 mg Oral Daily   famotidine  20 mg Oral BID   fluticasone furoate-vilanterol  1 puff Inhalation Daily   And   umeclidinium bromide  1 puff Inhalation Daily   insulin aspart  0-9 Units Subcutaneous TID WC   loratadine  10 mg Oral Daily   losartan  25 mg Oral Daily   melatonin  3 mg Oral QHS   metoprolol succinate  25 mg Oral BID   nicotine  21 mg Transdermal Daily   sertraline  50 mg Oral Daily   sodium chloride flush  3 mL Intravenous Q12H   tamsulosin  0.4 mg Oral QPC supper   torsemide  20 mg Oral Daily   Continuous  Infusions:  cefTRIAXone (ROCEPHIN)  IV 1 g (08/08/23 2309)   famotidine (PEPCID) IV       LOS: 5 days   Hughie Closs, MD [facility]  08/09/2023, 11:48 AM   *Please note that this is a verbal dictation therefore any spelling or grammatical errors are due to the "[employer]agon [employer] One" system  interpretation.  Please page via Amion and do not message via secure chat for urgent patient care matters. Secure chat can be used for non urgent patient care matters.  How to contact the [facility] Attending or Consulting provider 7A - 7P or covering provider during after hours 7P -7A, for this patient?  Check the care team in [facility] and look for a) attending/consulting TRH provider listed and b) the [facility] team listed. Page or secure chat 7A-7P. Log into [email or web address] and use [facility]'s universal password to access. If you do not have the password, please contact the hospital operator. Locate the [facility] provider you are looking for under [facility] and page to a number that you can be directly reached. If you still have difficulty reaching the provider, please page the [facility] (Director on Call) for the Hospitalists listed on amion for assistance.

## 2023-08-09 NOTE — Progress Notes (Signed)
 Pt has developed a rash with welts in some areas to back, arm and legs, no oral or resp symptoms.  Pt also has not voided since daughter arrived a little after noon per daughter report.  Bladder scan performed with 496 ml reflected, pt voided in commode an unmeasured amount.  Post void residual 370 ml.  Order obtained to do an I/O cath, bladder scan prior to I/O cath 659 ml, I/O cath done with 659 ml yellow urine obtained.

## 2023-08-10 ENCOUNTER — Encounter (HOSPITAL_COMMUNITY): Payer: Self-pay | Admitting: Internal Medicine

## 2023-08-10 ENCOUNTER — Other Ambulatory Visit: Payer: Self-pay

## 2023-08-10 DIAGNOSIS — F17210 Nicotine dependence, cigarettes, uncomplicated: Secondary | ICD-10-CM

## 2023-08-10 DIAGNOSIS — I2510 Atherosclerotic heart disease of native coronary artery without angina pectoris: Secondary | ICD-10-CM | POA: Diagnosis not present

## 2023-08-10 DIAGNOSIS — I5032 Chronic diastolic (congestive) heart failure: Secondary | ICD-10-CM | POA: Diagnosis not present

## 2023-08-10 DIAGNOSIS — I2111 ST elevation (STEMI) myocardial infarction involving right coronary artery: Secondary | ICD-10-CM | POA: Diagnosis not present

## 2023-08-10 DIAGNOSIS — Z955 Presence of coronary angioplasty implant and graft: Secondary | ICD-10-CM | POA: Diagnosis not present

## 2023-08-10 DIAGNOSIS — R404 Transient alteration of awareness: Secondary | ICD-10-CM

## 2023-08-10 DIAGNOSIS — I4892 Unspecified atrial flutter: Secondary | ICD-10-CM

## 2023-08-10 DIAGNOSIS — I480 Paroxysmal atrial fibrillation: Secondary | ICD-10-CM

## 2023-08-10 DIAGNOSIS — N390 Urinary tract infection, site not specified: Secondary | ICD-10-CM

## 2023-08-10 DIAGNOSIS — I2129 ST elevation (STEMI) myocardial infarction involving other sites: Secondary | ICD-10-CM | POA: Diagnosis not present

## 2023-08-10 DIAGNOSIS — E876 Hypokalemia: Secondary | ICD-10-CM

## 2023-08-10 LAB — BASIC METABOLIC PANEL
Anion gap: 10 (ref 5–15)
BUN: 37 mg/dL — ABNORMAL HIGH (ref 8–23)
CO2: 27 mmol/L (ref 22–32)
Calcium: 8.3 mg/dL — ABNORMAL LOW (ref 8.9–10.3)
Chloride: 98 mmol/L (ref 98–111)
Creatinine, Ser: 1.46 mg/dL — ABNORMAL HIGH (ref 0.61–1.24)
GFR, Estimated: 48 mL/min — ABNORMAL LOW (ref >60)
Glucose, Bld: 164 mg/dL — ABNORMAL HIGH (ref 70–99)
Potassium: 3.2 mmol/L — ABNORMAL LOW (ref 3.5–5.1)
Sodium: 135 mmol/L (ref 135–145)

## 2023-08-10 LAB — CBC WITH DIFFERENTIAL/PLATELET
Abs Immature Granulocytes: 0.05 10*3/uL (ref 0.00–0.07)
Basophils Absolute: 0 10*3/uL (ref 0.0–0.1)
Basophils Relative: 0 %
Eosinophils Absolute: 0.2 10*3/uL (ref 0.0–0.5)
Eosinophils Relative: 2 %
HCT: 38.5 % — ABNORMAL LOW (ref 39.0–52.0)
Hemoglobin: 12.8 g/dL — ABNORMAL LOW (ref 13.0–17.0)
Immature Granulocytes: 1 %
Lymphocytes Relative: 21 %
Lymphs Abs: 2.1 10*3/uL (ref 0.7–4.0)
MCH: 30.8 pg (ref 26.0–34.0)
MCHC: 33.2 g/dL (ref 30.0–36.0)
MCV: 92.5 fL (ref 80.0–100.0)
Monocytes Absolute: 0.7 10*3/uL (ref 0.1–1.0)
Monocytes Relative: 7 %
Neutro Abs: 7.1 10*3/uL (ref 1.7–7.7)
Neutrophils Relative %: 69 %
Platelets: 180 10*3/uL (ref 150–400)
RBC: 4.16 MIL/uL — ABNORMAL LOW (ref 4.22–5.81)
RDW: 13.4 % (ref 11.5–15.5)
WBC: 10 10*3/uL (ref 4.0–10.5)
nRBC: 0 % (ref 0.0–0.2)

## 2023-08-10 LAB — URINE CULTURE: Culture: >=100000 — AB

## 2023-08-10 LAB — GLUCOSE, CAPILLARY
Glucose-Capillary: 156 mg/dL — ABNORMAL HIGH (ref 70–99)
Glucose-Capillary: 162 mg/dL — ABNORMAL HIGH (ref 70–99)
Glucose-Capillary: 182 mg/dL — ABNORMAL HIGH (ref 70–99)
Glucose-Capillary: 136 mg/dL — ABNORMAL HIGH (ref 70–99)

## 2023-08-10 MED ORDER — SODIUM CHLORIDE 0.9 % IV SOLN: INTRAVENOUS | Status: AC

## 2023-08-10 MED ORDER — POTASSIUM CHLORIDE CRYS ER 20 MEQ PO TBCR
40.0000 meq | EXTENDED_RELEASE_TABLET | ORAL | Status: AC
  Administered 2023-08-10 (×2): 40 meq via ORAL
  Filled 2023-08-10: qty 2

## 2023-08-10 MED ORDER — CIPROFLOXACIN HCL 500 MG PO TABS
250.0000 mg | ORAL_TABLET | Freq: Two times a day (BID) | ORAL | Status: DC
  Administered 2023-08-10 – 2023-08-11 (×2): 250 mg via ORAL
  Filled 2023-08-10 (×2): qty 1

## 2023-08-10 NOTE — Progress Notes (Signed)
 SATURATION QUALIFICATIONS: (This note is used to comply with regulatory documentation for home oxygen)  Patient Saturations on Room Air at Rest = 83%  Patient Saturations on Room Air while Ambulating = NT as pt needing 4LO2 at rest to keep sats >88%  Patient Saturations on 6 Liters of oxygen while Ambulating = 88%  Please briefly explain why patient needs home oxygen:Pt needing O2 at rest and with activity to keep sats >88%. Katiana Ruland M,PT Acute Rehab Services [phone]

## 2023-08-10 NOTE — Plan of Care (Signed)

## 2023-08-10 NOTE — Progress Notes (Signed)
 Physical Therapy Treatment Patient Details Name: Kevin Frank MRN: [id number] DOB: 1941/04/06 Today's Date: 08/10/2023   History of Present Illness Pt is 83 yo presenting to [facility] via EMS with inferior STEMI on 08/04/23. PMH: D HF, HTN, hyperlipidemia, CAD, Dm II, AAA s/p repair, lung cancer, COPD and OSA on 5L at home CPAP.    PT Comments  Pt admitted with above diagnosis. Pt was able to ambulate with RW with overall steady gait and occasional cues for safety. Pt continues to need 4LO2 at rest and 6LO2 with activity to maintain O2 saturation > 88%.  Educated pt several times to take standing rest break and to pursed lip breathe.  Daughter in law present and attentive to pt.  Pt currently with functional limitations due to the deficits listed below (see PT Problem List). Pt will benefit from acute skilled PT to increase their independence and safety with mobility to allow discharge.       If plan is discharge home, recommend the following: Help with stairs or ramp for entrance;Assistance with cooking/housework;Assist for transportation   Can travel by private vehicle        Equipment Recommendations  Rollator (4 wheels);Other (comment) (home O2)    Recommendations for Other Services       Precautions / Restrictions Precautions Precautions: Fall Restrictions Weight Bearing Restrictions Per Provider Order: No     Mobility  Bed Mobility Overal bed mobility: Needs Assistance Bed Mobility: Supine to Sit, Sit to Supine     Supine to sit: Supervision, HOB elevated Sit to supine: HOB elevated, Supervision   General bed mobility comments: Cues for technique and safety    Transfers Overall transfer level: Needs assistance Equipment used: Rolling walker (2 wheels) Transfers: Sit to/from Stand Sit to Stand: Contact guard assist   Step pivot transfers: Contact guard assist       General transfer comment: With cues for safety and hand placement/technique.     Ambulation/Gait Ambulation/Gait assistance: Contact guard assist Gait Distance (Feet): 300 Feet Assistive device: Rolling walker (2 wheels) Gait Pattern/deviations: Step-through pattern, Decreased stride length, Drifts right/left Gait velocity: WNL Gait velocity interpretation: 1.31 - 2.62 ft/sec, indicative of limited community ambulator   General Gait Details: Pt requires verbal cues for proximity to AD with intermittent forward flexion. Resting UE on RW without significant UE support just lightly for balance. Continues to need O2 at rest and with ambulation.   Stairs             Wheelchair Mobility     Tilt Bed    Modified Rankin (Stroke Patients Only)       Balance Overall balance assessment: Needs assistance Sitting-balance support: No upper extremity supported, Feet supported Sitting balance-Leahy Scale: Good Sitting balance - Comments: sitting EOB   Standing balance support: Single extremity supported, During functional activity, Bilateral upper extremity supported Standing balance-Leahy Scale: Poor Standing balance comment: Light UE support just for balance, no overt LOB                            Communication Communication Communication: No apparent difficulties;Other (comment) (Pt is HOH) Factors Affecting Communication: Hearing impaired (Pt has and wears hearing aids)  Cognition Arousal: Alert Behavior During Therapy: WFL for tasks assessed/performed, Impulsive (Largely WFL with occasional impulsiveness with movement; this may be related to pt being [facility] and not hearing instructions clearly)   PT - Cognitive impairments: Problem solving, Safety/Judgement  Following commands: Intact      Cueing Cueing Techniques: Visual cues, Tactile cues (due to pt being [facility])  Exercises General Exercises - Lower Extremity Ankle Circles/Pumps: AROM, Both, 10 reps, Supine Long Arc Quad: AROM, Both, 10 reps, Seated Hip  Flexion/Marching: AROM, Both, 10 reps, Seated    General Comments        Pertinent Vitals/Pain      Home Living Family/patient expects to be discharged to:: Private residence Living Arrangements: Spouse/significant other                      Prior Function            PT Goals (current goals can now be found in the care plan section) Acute Rehab PT Goals Patient Stated Goal: to return home with family Progress towards PT goals: Progressing toward goals    Frequency    Min 1X/week      PT Plan      Co-evaluation              AM-PAC PT "6 Clicks" Mobility   Outcome Measure  Help needed turning from your back to your side while in a flat bed without using bedrails?: A Little Help needed moving from lying on your back to sitting on the side of a flat bed without using bedrails?: A Little Help needed moving to and from a bed to a chair (including a wheelchair)?: A Little Help needed standing up from a chair using your arms (e.g., wheelchair or bedside chair)?: A Little Help needed to walk in hospital room?: A Little Help needed climbing 3-5 steps with a railing? : A Little 6 Click Score: 18    End of Session Equipment Utilized During Treatment: Gait belt;Oxygen Activity Tolerance: Patient tolerated treatment well Patient left: in bed;with call bell/phone within reach;with bed alarm set;with family/visitor present Nurse Communication: Mobility status PT Visit Diagnosis: Other abnormalities of gait and mobility (R26.89);Unsteadiness on feet (R26.81)     Time: 2952-8413 PT Time Calculation (min) (ACUTE ONLY): 20 min  Charges:    $Gait Training: 8-22 mins PT General Charges $$ ACUTE PT VISIT: 1 Visit                     Piedad Standiford M,PT Acute Rehab Services [phone]    Bevelyn Buckles 08/10/2023, 3:49 PM

## 2023-08-10 NOTE — Progress Notes (Signed)
 Rounding Note    Patient Name: Kevin Frank Date of Encounter: 08/11/2023  [facility] Cardiologist: Dina Rich, MD   Cc: STEMI  Subjective   No chest pain, shortness of breath, orthopnea, PND, swelling.   Inpatient Medications    Scheduled Meds:  allopurinol  300 mg Oral Daily   apixaban  5 mg Oral BID   aspirin  81 mg Oral Daily   atorvastatin  80 mg Oral Daily   Chlorhexidine Gluconate Cloth  6 each Topical Daily   ciprofloxacin  250 mg Oral BID   clopidogrel  75 mg Oral Daily   famotidine  20 mg Oral BID   fluticasone furoate-vilanterol  1 puff Inhalation Daily   And   umeclidinium bromide  1 puff Inhalation Daily   insulin aspart  0-9 Units Subcutaneous TID WC   loratadine  10 mg Oral Daily   losartan  25 mg Oral Daily   melatonin  3 mg Oral QHS   metoprolol succinate  50 mg Oral BID   nicotine  21 mg Transdermal Daily   sertraline  50 mg Oral Daily   sodium chloride flush  3 mL Intravenous Q12H   tamsulosin  0.4 mg Oral QPC supper   torsemide  20 mg Oral Daily   Continuous Infusions:   PRN Meds: acetaminophen, ALPRAZolam, alum & mag hydroxide-simeth, guaiFENesin-dextromethorphan, hydrocortisone cream, loperamide, magnesium hydroxide, ondansetron (ZOFRAN) IV, sodium chloride flush   Vital Signs    Vitals:   08/11/23 0315 08/11/23 0400 08/11/23 0749 08/11/23 0848  BP: 125/76  124/79   Pulse: 91  96   Resp: 17  18   Temp: (!) 97.5 F (36.4 C)  (!) 97.4 F (36.3 C)   TempSrc: Oral  Oral   SpO2: 91% 94% 96% 96%  Weight:      Height:        Intake/Output Summary (Last 24 hours) at 08/11/2023 1020 Last data filed at 08/11/2023 1000 Gross per 24 hour  Intake 889.69 ml  Output 2000 ml  Net -1110.31 ml      08/06/2023    6:57 PM 08/04/2023   10:38 AM 07/09/2023   11:25 AM  Last 3 Weights  Weight (lbs) 213 lb 9.6 oz 220 lb 0.3 oz 219 lb 12.8 oz  Weight (kg) 96.888 kg 99.8 kg 99.7 kg      Telemetry    Afib cVR. - Personally  Reviewed  ECG    N/a - Personally Reviewed  Physical Exam   GEN: No acute distress.   Neck: No JVD Cardiac: Irregularly irregular, variable S1-S2, no murmurs rubs or gallops appreciated. Respiratory: Clear to auscultation bilaterally. GI: Soft, nontender, non-distended  MS: No edema; No deformity.No rash Neuro:  Nonfocal  Psych: Normal affect   Labs    High Sensitivity Troponin:   Recent Labs  Lab 08/04/23 1108 08/04/23 1404  TROPONINIHS 1,030* 17,294*     Chemistry Recent Labs  Lab 08/04/23 1108 08/04/23 1110 08/04/23 1409 08/04/23 1655 08/05/23 0305 08/06/23 0214 08/07/23 0448 08/07/23 1133 08/08/23 0425 08/08/23 1030 08/09/23 0327 08/10/23 0418 08/11/23 0536  NA 138   < > 139  --    < > 138   < >  --    < >  --  136 135 139  K 2.8*   < > 3.3*  --    < > 4.4   < >  --    < >  --  3.6 3.2* 4.1  CL 97*  --  95*  --    < > 103   < >  --    < >  --  95* 98 102  CO2 27  --  30  --    < > 23   < >  --    < >  --  25 27 24   GLUCOSE 268*  --  252*  --    < > 149*   < >  --    < >  --  172* 164* 152*  BUN 12  --  11  --    < > 23   < >  --    < >  --  32* 37* 29*  CREATININE 1.29*  --  1.25*  --    < > 1.27*   < >  --    < >  --  1.49* 1.46* 1.22  CALCIUM 8.2*  --  9.1  --    < > 8.6*   < >  --    < >  --  8.7* 8.3* 8.9  MG  --   --  1.6* 1.6*  --  2.0  --   --   --   --   --   --   --   PROT 5.6*  --   --   --   --   --   --  7.3  --  6.5  --   --   --   ALBUMIN 2.9*  --   --   --   --   --   --  3.5  --  3.0*  --   --   --   AST 44*  --   --   --   --   --   --  30  --  18  --   --   --   ALT 26  --   --   --   --   --   --  33  --  24  --   --   --   ALKPHOS 67  --   --   --   --   --   --  67  --  59  --   --   --   BILITOT 0.8  --   --   --   --   --   --  0.9  --  0.7  --   --   --   GFRNONAA 55*  --  57*  --    < > 56*   < >  --    < >  --  47* 48* 59*  ANIONGAP 14  --  14  --    < > 12   < >  --    < >  --  16* 10 13   < > = values in this interval not  displayed.    Lipids  Recent Labs  Lab 08/04/23 1108  CHOL 82  TRIG 92  HDL 30*  LDLCALC 34  CHOLHDL 2.7    Hematology Recent Labs  Lab 08/09/23 0327 08/10/23 0418 08/11/23 0536  WBC 18.9* 10.0 10.7*  RBC 4.72 4.16* 4.19*  HGB 14.8 12.8* 13.1  HCT 43.6 38.5* 39.6  MCV 92.4 92.5 94.5  MCH 31.4 30.8 31.3  MCHC 33.9 33.2 33.1  RDW 13.6 13.4 13.7  PLT 196 180 184   Thyroid  Recent Labs  Lab 08/07/23 1133  TSH 0.378    BNPNo results for input(s): "BNP", "PROBNP" in the last 168 hours.  DDimer No results for input(s): "DDIMER" in the last 168 hours.   Radiology    No results found.  Cardiac Studies    Patient Profile     83 y.o. 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 RCA with severe proximal to mid vessel stenosis treated with 3 overlapping drug eluting stents. Went into new onset atrial flutter on 08/06/23.   Assessment & Plan  1.CAD/inferior STEMI - complicated by transient bradycardia, hypotension - 07/2023 echo: LVEF 60-65%, grad II dd - DES x 3 to RCA Continue aspirin 81 mg p.o. daily. Continue Lipitor 80 mg p.o. daily. Continue Plavix 75 mg p.o. daily. Continue losartan 25 mg p.o. daily. Continue Toprol-XL 50 mg p.o. twice daily Continue torsemide 20 mg p.o. daily. Patient will be on triple therapy for 1 month after that aspirin will be discontinued and patient will be on Eliquis and Plavix.  Patient's son is aware of these recommendations. Reemphasized the importance of complete cessation of smoking, currently smokes 2 to 3 packs/day   2.Chronic HFpEF Stable, euvolemic 2D echo EF 60-65%, G2DD  Medications as discussed above -can further be uptitrated at the time of follow-up. Not on SGLT2  inhibitors due to current urinary tract infection.   3.  Urinary tract infection: Currently on antibiotics, managed by internal medicine.  Appreciate their recommendations.    4. Atrial flutter/afib Newly discovered during his hospitalization. Started on amiodarone for rhythm control strategy but started experiencing some lip and facial swelling as well as rash.  Amiodarone was discontinued. Will focus on rate control strategy for now. Currently on anticoagulation for thromboembolic prophylaxis given elevated CHA2DS2-VASc score Patient and son are aware of the risk of bleeding.  Risks, benefits, and alternatives discussed Would benefit from follow-up at A-fib clinic at the time of discharge -if he remains in atrial flutter/fib a month out could consider direct-current cardioversion.  5.  Altered mental status: Likely secondary to urinary tract infection, Xanax, etc. resolved.   6. Lip/oral swelling/rash - as reported above, suspect secondary to amiodarone - amio discontinued 08/07/22.  - pharmD has recommended loratadine 10 mg once daily and can also do famotidine 20 mg IV x1 dose and then transition to 20 mg PO BID thereafter  7.Urinary retention: Management per primary team.  Internal medicine recommending discharge on Flomax.  Follow-up with PCP.  8. Hypokalemia: Currently on Kdur.  9. Smoking:  Tobacco cessation counseling: Currently smoking 2-3 packs/day   He is informed of the dangers of tobacco abuse including stroke, cancer, and MI, as well as benefits of tobacco cessation. He is not willing to quit at this time. 5 mins were spent counseling patient cessation techniques. We discussed various methods to help quit smoking, including deciding on a date to quit, joining a support group, pharmacological agents- nicotine gum/patch/lozenges.   Will await internal medicine recommendation to discharge.   For questions or updates, please contact [facility] Please consult  [email or web address] for contact info under      Signed, Tessa Lerner, DO, [facility]  [facility]  [address] [place], [place] [address] Pager: [phone])  [phone] Office: [phone] 10:20 AM

## 2023-08-10 NOTE — Progress Notes (Signed)
 PROGRESS NOTE    Kevin Frank  RUE:[id number] DOB: 03/07/1941 DOA: 08/04/2023 PCP: Elfredia Nevins, MD   Brief Narrative:  HPI: Kevin Frank is a 83 y.o. 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 RCA with severe proximal to mid vessel stenosis treated with 3 overlapping drug eluting stents. Went into new onset atrial flutter on 08/06/23.  Hospitalist service was consulted 08/08/2023 due to rising creatinine, encephalopathy and possible UTI management.  Patient seen and examined at the bedside, daughter at the bedside.  Patient was fully coherent, starting to eat breakfast.  Very hard of hearing.  Patient had no complaints at all.   Assessment & Plan:   Principal Problem:   STEMI (ST elevation myocardial infarction) (HCC)  1.CAD/inferior STEMI - complicated by transient bradycardia, hypotension - DES x 3 to RCA - medical therapy with ASA/plavix/atorvastatin 80 /toprol 25/losartan 25 - note with new aflutter on triple therapy ASA/plavix/eliquis, cardiology would plan for 1 month then just eliquis and plavix.  Management per cardiology.   2.Chronic HFpEF - 2D echo EF 60-65%, G2DD  - he is on torsemide 20mg  daily. SGLT2i stopped due to UTI this admission Management per cardiology.   3. Elevated WBC/Probable UTI: UA consistent with UTI, urine culture>=100,000 COLONIES/mL MORGANELLA MORGANII continue Rocephin and await sensitivities, leukocytosis completely resolved.   4. Aflutter/afib -new diagnosis this admission, management per primary team/cardiology.   5. AMS/delirium - likely secondary to UTI and Xanax that he had received early morning of 08/08/2023.  Patient has been alert and oriented during my encounter yesterday and  today.   6. Lip/oral swelling Resolved and this was transient.  7.  Urinary retention/history of BPH: Reports of retention requiring in and out catheter early morning of 08/09/2023, started on Flomax, per nurse, another straight cath needed to be done early morning today with 600 output.  Patient was advised to be more mobile.  Nurse has been advised to reach out to me if there is retention again, check bladder scan frequently.  If he retains again, may need to put a Foley catheter at this time.  8.  Drug rash: Patient developed maculopapular rash all over the body.  Please see picture below as one of the example.  Possibility of drug rash.  Recently started on amiodarone which was stopped eventually After consulting with pharmacy, patient started on cetirizine and famotidine and watch for next 1 to 2 days.  Benadryl as needed for itching.  Rash has completely resolved today.  9.  AKI: Mild elevation in creatinine, started on gentle hydration but creatinine minimally improved to 1.46 today.  Will start another liter of gentle normal saline today.  10.  Hypokalemia: Replenished.    DVT prophylaxis: Eliquis   Code Status: Full Code  Family Communication: Daughter present at bedside.  Plan of care discussed with patient in length and he/she verbalized understanding and agreed with it.  Status is: Inpatient Remains inpatient appropriate because: Discharge per primary attendings discretion.   Estimated body mass index is 30.65 kg/m as calculated from the following:   Height as of this encounter: 5\' 10"  (1.778 m).   Weight as of this encounter: 96.9 kg.    Nutritional Assessment: Body mass index is 30.65 kg/m.[name] Seen by dietician.  I agree with the assessment and plan as outlined below: Nutrition Status:        . Skin Assessment: I have examined the patient's skin and I agree with the wound assessment as performed by the wound care RN as outlined below:    Consultants:   TRH  Procedures:  As above  Antimicrobials:  Anti-infectives (From admission, onward)    Start     Dose/Rate Route Frequency Ordered Stop   08/07/23 2130  cefTRIAXone (ROCEPHIN) 1 g in sodium chloride 0.9 % 100 mL IVPB        1 g 200 mL/hr over 30 Minutes Intravenous Every 24 hours 08/07/23 2123           Subjective: Patient seen and examined.  Son at the bedside.  Patient fully alert and oriented.  He has no complaint at all.  Objective: Vitals:   08/09/23 2014 08/09/23 2346 08/10/23 0348 08/10/23 0724  BP: (!) 105/58 (!) 98/54 94/63 111/67  Pulse: (!) 107 93 87 94  Resp: 18 16 19 18   Temp: 98.3 F (36.8 C) 98.1 F (36.7 C) 97.9 F (36.6 C) 97.6 F (36.4 C)  TempSrc: Oral Oral Oral Oral  SpO2: 94% 98% 95% 98%  Weight:      Height:        Intake/Output Summary (Last 24 hours) at 08/10/2023 0755 Last data filed at 08/10/2023 0358 Gross per 24 hour  Intake 1451.53 ml  Output 875 ml  Net 576.53 ml   Filed Weights   08/04/23 1038 08/06/23 1857  Weight: 99.8 kg 96.9 kg    Examination:  General exam: Appears calm and comfortable  Respiratory system: Clear to auscultation. Respiratory effort normal. Cardiovascular system: S1 & S2 heard, RRR. No JVD, murmurs, rubs, gallops or clicks. No pedal edema. Gastrointestinal system: Abdomen is nondistended, soft and nontender. No organomegaly or masses felt. Normal bowel sounds heard. Central nervous system: Alert and oriented. No focal neurological deficits. Extremities: Symmetric 5 x 5 power. Skin: No rashes, lesions or ulcers.  Psychiatry: Judgement and insight appear normal. Mood & affect appropriate.    Data Reviewed: I have personally reviewed following labs and imaging studies  CBC: Recent Labs  Lab 08/04/23 1108 08/04/23 1110 08/07/23 0448 08/07/23 2015 08/08/23 0425 08/09/23 0327 08/10/23 0418  WBC 15.5*   < > 13.6* 23.2* 24.9* 18.9* 10.0  NEUTROABS 12.1*  --   --  20.1* 21.8* 15.8* 7.1  HGB 13.4   <  > 13.6 14.8 14.3 14.8 12.8*  HCT 38.4*   < > 41.6 43.3 41.6 43.6 38.5*  MCV 90.4   < > 93.1 91.7 91.8 92.4 92.5  PLT 193   < > 169 191 187 196 180   < > = values in this interval not displayed.   Basic Metabolic Panel: Recent Labs  Lab 08/04/23 1409 08/04/23 1655 08/05/23 0305 08/06/23 0214 08/07/23 0448 08/08/23 0425 08/09/23 0327 08/10/23 0418  NA 139  --    < > 138 136 136 136 135  K 3.3*  --    < > 4.4 3.8 3.8 3.6 3.2*  CL 95*  --    < > 103 98 99 95* 98  CO2 30  --    < > 23 27 24 25 27   GLUCOSE 252*  --    < > 149* 184* 176* 172* 164*  BUN 11  --    < > 23 21 22  32* 37*  CREATININE 1.25*  --    < >  1.27* 1.20 1.29* 1.49* 1.46*  CALCIUM 9.1  --    < > 8.6* 8.7* 8.5* 8.7* 8.3*  MG 1.6* 1.6*  --  2.0  --   --   --   --    < > = values in this interval not displayed.   GFR: Estimated Creatinine Clearance: 45.6 mL/min (A) (by C-G formula based on SCr of 1.46 mg/dL (H)). Liver Function Tests: Recent Labs  Lab 08/04/23 1108 08/07/23 1133 08/08/23 1030  AST 44* 30 18  ALT 26 33 24  ALKPHOS 67 67 59  BILITOT 0.8 0.9 0.7  PROT 5.6* 7.3 6.5  ALBUMIN 2.9* 3.5 3.0*   No results for input(s): "LIPASE", "AMYLASE" in the last 168 hours. Recent Labs  Lab 08/08/23 1030  AMMONIA 28   Coagulation Profile: Recent Labs  Lab 08/04/23 1108  INR 1.3*   Cardiac Enzymes: No results for input(s): "CKTOTAL", "CKMB", "CKMBINDEX", "TROPONINI" in the last 168 hours. BNP (last 3 results) No results for input(s): "PROBNP" in the last 8760 hours. HbA1C: No results for input(s): "HGBA1C" in the last 72 hours. CBG: Recent Labs  Lab 08/09/23 0727 08/09/23 1121 08/09/23 1607 08/09/23 2149 08/10/23 0723  GLUCAP 203* 173* 162* 219* 156*   Lipid Profile: No results for input(s): "CHOL", "HDL", "LDLCALC", "TRIG", "CHOLHDL", "LDLDIRECT" in the last 72 hours. Thyroid Function Tests: Recent Labs    08/07/23 1133  TSH 0.378   Anemia Panel: No results for input(s): "VITAMINB12",  "FOLATE", "FERRITIN", "TIBC", "IRON", "RETICCTPCT" in the last 72 hours. Sepsis Labs: Recent Labs  Lab 08/04/23 1051 08/04/23 1409 08/04/23 1655  LATICACIDVEN 2.9* 2.0* 1.8    Recent Results (from the past 240 hours)  Urine Culture (for pregnant, neutropenic or urologic patients or patients with an indwelling urinary catheter)     Status: Abnormal (Preliminary result)   Collection Time: 08/07/23  7:22 PM   Specimen: Urine, Clean Catch  Result Value Ref Range Status   Specimen Description URINE, CLEAN CATCH  Final   Special Requests NONE  Final   Culture (A)  Final    >=100,000 COLONIES/mL MORGANELLA MORGANII SUSCEPTIBILITIES TO FOLLOW Performed at [facility], [address], [place], [place] [address]    Report Status PENDING  Incomplete  Culture, blood (Routine X 2) w Reflex to ID Panel     Status: None (Preliminary result)   Collection Time: 08/08/23 10:32 AM   Specimen: BLOOD LEFT ARM  Result Value Ref Range Status   Specimen Description BLOOD LEFT ARM  Final   Special Requests   Final    BOTTLES DRAWN AEROBIC AND ANAEROBIC Blood Culture results may not be optimal due to an inadequate volume of blood received in culture bottles   Culture   Final    NO GROWTH < 24 HOURS Performed at [facility], [address] [place], [place] [address]    Report Status PENDING  Incomplete  Culture, blood (Routine X 2) w Reflex to ID Panel     Status: None (Preliminary result)   Collection Time: 08/08/23 10:33 AM   Specimen: BLOOD RIGHT ARM  Result Value Ref Range Status   Specimen Description BLOOD RIGHT ARM  Final   Special Requests   Final    BOTTLES DRAWN AEROBIC ONLY Blood Culture results may not be optimal due to an inadequate volume of blood received in culture bottles   Culture   Final    NO GROWTH < 24 HOURS Performed at [facility] Lab, [address] [place], [place] [address]    Report Status PENDING  Incomplete     Radiology Studies: No results  found.  Scheduled Meds:  allopurinol  300 mg Oral Daily   apixaban  5 mg Oral BID   aspirin  81 mg Oral Daily   atorvastatin  80 mg Oral Daily   Chlorhexidine Gluconate Cloth  6 each Topical Daily   clopidogrel  75 mg Oral Daily   famotidine  20 mg Oral BID   fluticasone furoate-vilanterol  1 puff Inhalation Daily   And   umeclidinium bromide  1 puff Inhalation Daily   insulin aspart  0-9 Units Subcutaneous TID WC   loratadine  10 mg Oral Daily   losartan  25 mg Oral Daily   melatonin  3 mg Oral QHS   metoprolol succinate  50 mg Oral BID   nicotine  21 mg Transdermal Daily   potassium chloride  40 mEq Oral Q4H   sertraline  50 mg Oral Daily   sodium chloride flush  3 mL Intravenous Q12H   tamsulosin  0.4 mg Oral QPC supper   torsemide  20 mg Oral Daily   Continuous Infusions:  cefTRIAXone (ROCEPHIN)  IV Stopped (08/09/23 2148)     LOS: 6 days   Hughie Closs, MD [facility]  08/10/2023, 7:55 AM   *Please note that this is a verbal dictation therefore any spelling or grammatical errors are due to the "Dragon Medical One" system interpretation.  Please page via Amion and do not message via secure chat for urgent patient care matters. Secure chat can be used for non urgent patient care matters.  How to contact the [facility] Attending or Consulting provider 7A - 7P or covering provider during after hours 7P -7A, for this patient?  Check the care team in [facility] and look for a) attending/consulting TRH provider listed and b) the [facility] team listed. Page or secure chat 7A-7P. Log into [email or web address] and use [place]'s universal password to access. If you do not have the password, please contact the hospital operator. Locate the [facility] provider you are looking for under [facility] and page to a number that you can be directly reached. If you still have difficulty reaching the provider, please page the [facility] (Director on Call) for the Hospitalists listed on amion for assistance.

## 2023-08-10 NOTE — Progress Notes (Signed)
 Mobility Specialist Progress Note;   08/10/23 1010  Mobility  Activity Ambulated with assistance in hallway  Level of Assistance Minimal assist, patient does 75% or more  Assistive Device Front wheel walker  Distance Ambulated (ft) 350 ft  Activity Response Tolerated well  Mobility Referral Yes  Mobility visit 1 Mobility  Mobility Specialist Start Time (ACUTE ONLY) 1010  Mobility Specialist Stop Time (ACUTE ONLY) 1027  Mobility Specialist Time Calculation (min) (ACUTE ONLY) 17 min   RN requesting pt to walk w/ mobility, pt agreeable. On 4LO2 upon arrival. Required MinA during ambulation for support and safety. Ambulated on 4LO2, VSS throughout. No c/o during session. Verbal cues required for safety as pt is HOH. Pt returned back to bed with all needs met, alarm on. Son in room.   Caesar Bookman Mobility Specialist Please contact via SecureChat or [employer] [phone]

## 2023-08-10 NOTE — Progress Notes (Signed)
   08/10/23 2242  BiPAP/CPAP/SIPAP  $ Non-Invasive Ventilator  Non-Invasive Vent Subsequent  BiPAP/CPAP/SIPAP Pt Type Adult  BiPAP/CPAP/SIPAP Resmed  Mask Type Full face mask  Mask Size Large  Respiratory Rate 18 breaths/min  Flow Rate 4 lpm  Patient Home Equipment No  Auto Titrate Yes  CPAP/SIPAP surface wiped down Yes  BiPAP/CPAP /SiPAP Vitals  Pulse Rate 74  Bilateral Breath Sounds Diminished

## 2023-08-11 ENCOUNTER — Other Ambulatory Visit (HOSPITAL_COMMUNITY): Payer: Self-pay

## 2023-08-11 DIAGNOSIS — I5032 Chronic diastolic (congestive) heart failure: Secondary | ICD-10-CM | POA: Diagnosis not present

## 2023-08-11 DIAGNOSIS — I2111 ST elevation (STEMI) myocardial infarction involving right coronary artery: Secondary | ICD-10-CM | POA: Diagnosis not present

## 2023-08-11 DIAGNOSIS — R339 Retention of urine, unspecified: Secondary | ICD-10-CM

## 2023-08-11 DIAGNOSIS — I480 Paroxysmal atrial fibrillation: Secondary | ICD-10-CM | POA: Diagnosis not present

## 2023-08-11 DIAGNOSIS — Z955 Presence of coronary angioplasty implant and graft: Secondary | ICD-10-CM | POA: Diagnosis not present

## 2023-08-11 LAB — CBC WITH DIFFERENTIAL/PLATELET
Abs Immature Granulocytes: 0.06 10*3/uL (ref 0.00–0.07)
Basophils Absolute: 0.1 10*3/uL (ref 0.0–0.1)
Basophils Relative: 1 %
Eosinophils Absolute: 0.1 10*3/uL (ref 0.0–0.5)
Eosinophils Relative: 1 %
HCT: 39.6 % (ref 39.0–52.0)
Hemoglobin: 13.1 g/dL (ref 13.0–17.0)
Immature Granulocytes: 1 %
Lymphocytes Relative: 16 %
Lymphs Abs: 1.8 10*3/uL (ref 0.7–4.0)
MCH: 31.3 pg (ref 26.0–34.0)
MCHC: 33.1 g/dL (ref 30.0–36.0)
MCV: 94.5 fL (ref 80.0–100.0)
Monocytes Absolute: 0.9 10*3/uL (ref 0.1–1.0)
Monocytes Relative: 8 %
Neutro Abs: 7.8 10*3/uL — ABNORMAL HIGH (ref 1.7–7.7)
Neutrophils Relative %: 73 %
Platelets: 184 10*3/uL (ref 150–400)
RBC: 4.19 MIL/uL — ABNORMAL LOW (ref 4.22–5.81)
RDW: 13.7 % (ref 11.5–15.5)
WBC: 10.7 10*3/uL — ABNORMAL HIGH (ref 4.0–10.5)
nRBC: 0 % (ref 0.0–0.2)

## 2023-08-11 LAB — BASIC METABOLIC PANEL
Anion gap: 13 (ref 5–15)
BUN: 29 mg/dL — ABNORMAL HIGH (ref 8–23)
CO2: 24 mmol/L (ref 22–32)
Calcium: 8.9 mg/dL (ref 8.9–10.3)
Chloride: 102 mmol/L (ref 98–111)
Creatinine, Ser: 1.22 mg/dL (ref 0.61–1.24)
GFR, Estimated: 59 mL/min — ABNORMAL LOW (ref >60)
Glucose, Bld: 152 mg/dL — ABNORMAL HIGH (ref 70–99)
Potassium: 4.1 mmol/L (ref 3.5–5.1)
Sodium: 139 mmol/L (ref 135–145)

## 2023-08-11 LAB — GLUCOSE, CAPILLARY: Glucose-Capillary: 156 mg/dL — ABNORMAL HIGH (ref 70–99)

## 2023-08-11 MED ORDER — LOSARTAN POTASSIUM 25 MG PO TABS
25.0000 mg | ORAL_TABLET | Freq: Every day | ORAL | 3 refills | Status: DC
Start: 2023-08-12 — End: 2023-09-28
  Filled 2023-08-11: qty 90, 90d supply, fill #0

## 2023-08-11 MED ORDER — ASPIRIN 81 MG PO TBEC
81.0000 mg | DELAYED_RELEASE_TABLET | Freq: Every day | ORAL | 0 refills | Status: AC
  Filled 2023-08-11: qty 30, 30d supply, fill #0

## 2023-08-11 MED ORDER — LORATADINE 10 MG PO TABS
10.0000 mg | ORAL_TABLET | Freq: Every day | ORAL | 0 refills | Status: DC | PRN
  Filled 2023-08-11: qty 30, 30d supply, fill #0

## 2023-08-11 MED ORDER — NITROGLYCERIN 0.4 MG SL SUBL
0.4000 mg | SUBLINGUAL_TABLET | SUBLINGUAL | 3 refills | Status: AC | PRN
  Filled 2023-08-11: qty 25, 5d supply, fill #0

## 2023-08-11 MED ORDER — CIPROFLOXACIN HCL 250 MG PO TABS
250.0000 mg | ORAL_TABLET | Freq: Two times a day (BID) | ORAL | 0 refills | Status: DC
  Filled 2023-08-11: qty 3, 2d supply, fill #0

## 2023-08-11 MED ORDER — METOPROLOL SUCCINATE ER 50 MG PO TB24
50.0000 mg | ORAL_TABLET | Freq: Two times a day (BID) | ORAL | 3 refills | Status: DC
  Filled 2023-08-11: qty 90, 45d supply, fill #0

## 2023-08-11 MED ORDER — ATORVASTATIN CALCIUM 80 MG PO TABS
80.0000 mg | ORAL_TABLET | Freq: Every day | ORAL | 3 refills | Status: DC
  Filled 2023-08-11: qty 90, 90d supply, fill #0

## 2023-08-11 MED ORDER — NICOTINE 21 MG/24HR TD PT24
21.0000 mg | MEDICATED_PATCH | Freq: Every day | TRANSDERMAL | 0 refills | Status: DC
Start: 2023-08-12 — End: 2023-10-24
  Filled 2023-08-11: qty 28, 28d supply, fill #0

## 2023-08-11 MED ORDER — FAMOTIDINE 20 MG PO TABS
20.0000 mg | ORAL_TABLET | Freq: Two times a day (BID) | ORAL | 0 refills | Status: DC | PRN
  Filled 2023-08-11: qty 60, 30d supply, fill #0

## 2023-08-11 MED ORDER — FAMOTIDINE 20 MG PO TABS
20.0000 mg | ORAL_TABLET | Freq: Two times a day (BID) | ORAL | 2 refills | Status: DC
  Filled 2023-08-11: qty 60, 30d supply, fill #0

## 2023-08-11 MED ORDER — CLOPIDOGREL BISULFATE 75 MG PO TABS
75.0000 mg | ORAL_TABLET | Freq: Every day | ORAL | 3 refills | Status: DC
  Filled 2023-08-11: qty 90, 90d supply, fill #0

## 2023-08-11 MED ORDER — APIXABAN 5 MG PO TABS
5.0000 mg | ORAL_TABLET | Freq: Two times a day (BID) | ORAL | 3 refills | Status: DC
  Filled 2023-08-11: qty 60, 30d supply, fill #0

## 2023-08-11 MED ORDER — LORATADINE 10 MG PO TABS
10.0000 mg | ORAL_TABLET | Freq: Every day | ORAL | 3 refills | Status: DC
  Filled 2023-08-11: qty 90, 90d supply, fill #0

## 2023-08-11 MED ORDER — TAMSULOSIN HCL 0.4 MG PO CAPS
0.4000 mg | ORAL_CAPSULE | Freq: Every day | ORAL | 3 refills | Status: DC
  Filled 2023-08-11: qty 90, 90d supply, fill #0

## 2023-08-11 NOTE — TOC Transition Note (Signed)
 Transition of Care [facility]) - Discharge Note   Patient Details  Name: Kevin Frank MRN: [id number] Date of Birth: Nov 27, 1940  Transition of Care [facility]) CM/SW Contact:  Gala Lewandowsky, RN Phone Number: 08/11/2023, 11:20 AM   Clinical Narrative: Case Manager spoke with PA to write home health orders and F2F. [facility] notified of the discharge to home today. No further needs identified at this time.     Final next level of care: Home w Home Health Services Barriers to Discharge: No Barriers Identified  Patient Goals and CMS Choice Patient states their goals for this hospitalization and ongoing recovery are:: wants to be able to go home CMS Medicare.gov Compare Post Acute Care list provided to:: Patient Represenative (must comment) Choice offered to / list presented to : Spouse   Discharge Plan and Services Additional resources added to the After Visit Summary for     Discharge Planning Services: CM Consult Post Acute Care Choice: Home Health          DME Arranged: Other see comment DME Agency: [employer] Date DME Agency Contacted: 08/06/23 Time DME Agency Contacted: [phone] Representative spoke with at DME Agency: [facility] Arranged: RN, PT [facility] Agency: [facility] Date [facility] Agency Contacted: 08/06/23 Time HH Agency Contacted: 1652 Representative spoke with at [facility] Agency: Clifton Custard  Social Drivers of Health (SDOH) Interventions SDOH Screenings   Food Insecurity: No Food Insecurity (08/05/2023)  Housing: Low Risk  (08/05/2023)  Transportation Needs: No Transportation Needs (08/05/2023)  Utilities: Not At Risk (08/05/2023)  Depression (PHQ2-9): Low Risk  (09/07/2019)  Financial Resource Strain: Low Risk  (03/05/2022)  Social Connections: Moderately Integrated (08/10/2023)  Tobacco Use: High Risk (08/10/2023)   Readmission Risk Interventions     No data to display

## 2023-08-11 NOTE — Care Management Important Message (Signed)
 Important Message  Patient Details  Name: Kevin Frank MRN: [id number] Date of Birth: 08-19-1940   Important Message Given:  Yes - Medicare IM     Renie Ora 08/11/2023, 1:41 PM

## 2023-08-11 NOTE — Progress Notes (Addendum)
 PROGRESS NOTE    Kevin Frank  ZOX:[id number] DOB: 02-May-1941 DOA: 08/04/2023 PCP: Elfredia Nevins, MD   Brief Narrative:  HPI: Kevin Frank is a 83 y.o. 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 RCA with severe proximal to mid vessel stenosis treated with 3 overlapping drug eluting stents. Went into new onset atrial flutter on 08/06/23.  Hospitalist service was consulted 08/08/2023 due to rising creatinine, encephalopathy and possible UTI management.  Patient seen and examined at the bedside, daughter at the bedside.  Patient was fully coherent, starting to eat breakfast.  Very hard of hearing.  Patient had no complaints at all.   Assessment & Plan:   Principal Problem:   STEMI (ST elevation myocardial infarction) (HCC) Active Problems:   S/P right coronary artery (RCA) stent placement   Urinary tract infection without hematuria   Atrial flutter (HCC)   Paroxysmal atrial fibrillation (HCC)   Transient alteration of awareness   Hypokalemia   Cigarette smoker   Atherosclerosis of native coronary artery of native heart without angina pectoris  1.CAD/inferior STEMI - complicated by transient bradycardia, hypotension - DES x 3 to RCA - medical therapy with ASA/plavix/atorvastatin 80 /toprol 25/losartan 25 - note with new aflutter on triple therapy ASA/plavix/eliquis, cardiology would plan for 1 month then just eliquis and plavix.  Management per cardiology.   2.Chronic HFpEF - 2D echo EF 60-65%, G2DD  - he is on torsemide 20mg  daily. SGLT2i stopped due to UTI this admission Management per cardiology.   3. Elevated WBC/Probable UTI: UA consistent with UTI, urine culture>=100,000 COLONIES/mL MORGANELLA MORGANII sensitivities  back, patient transitioned to ciprofloxacin for 5 more doses yesterday.  This should be prescribed at discharge.   4. Aflutter/afib -new diagnosis this admission, management per primary team/cardiology.   5. AMS/delirium/UTI - likely secondary to UTI and Xanax that he had received early morning of 08/08/2023.  Patient has been alert and oriented during my encounter since the morning of 08/09/2023.     6. Lip/oral swelling Resolved and this was transient.  7.  Urinary retention/history of BPH: Reports of retention requiring in and out catheter early morning of 08/09/2023, started on Flomax, per nurse, another straight cath needed to be done early morning today with 600 output.  Patient was advised to be more mobile.  There has been no issues of retention reported to me ever since.  Will recommend discharging on once daily Flomax.  8.  Drug rash: Patient developed maculopapular rash all over the body.  Please see picture below as one of the example.  Possibility of drug rash.  Recently started on amiodarone which was stopped eventually After consulting with pharmacy, patient started on cetirizine and famotidine and watch for next 1 to 2 days.  Benadryl as needed for itching.  Rash has completely resolved since 08/10/2023.  9.  AKI: Mild elevation in creatinine which jumped to 1.49, with IV hydration, normalized and back to 1.22 today.  10.  Hypokalemia: Replenished.  Normal today.    DVT prophylaxis: Eliquis   Code Status: Full Code  Family Communication: Family member present at bedside.  Plan of care discussed with patient in length and he/she verbalized understanding and agreed with it.  Status is: Inpatient Remains inpatient appropriate because: Discharge  per primary attendings discretion.   Estimated body mass index is 30.65 kg/m as calculated from the following:   Height as of this encounter: 5\' 10"  (1.778 m).   Weight as of this encounter: 96.9 kg.    Nutritional Assessment: Body  mass index is 30.65 kg/m.[name] Seen by dietician.  I agree with the assessment and plan as outlined below: Nutrition Status:        . Skin Assessment: I have examined the patient's skin and I agree with the wound assessment as performed by the wound care RN as outlined below:    Consultants:  TRH  Procedures:  As above  Antimicrobials:  Anti-infectives (From admission, onward)    Start     Dose/Rate Route Frequency Ordered Stop   08/10/23 2000  ciprofloxacin (CIPRO) tablet 250 mg        250 mg Oral 2 times daily 08/10/23 1456 08/13/23 0759   08/07/23 2130  cefTRIAXone (ROCEPHIN) 1 g in sodium chloride 0.9 % 100 mL IVPB  Status:  Discontinued        1 g 200 mL/hr over 30 Minutes Intravenous Every 24 hours 08/07/23 2123 08/10/23 1456         Subjective: Seen and examined.  No complaints at all.  Medical issues that we were consulted for have been resolved.  Patient also appears to be good to discharge.  Hospital service will sign off.  Discharge per primary service/cardiology.  Patient and family member desire to get discharged today.  Objective: Vitals:   08/10/23 2242 08/11/23 0000 08/11/23 0315 08/11/23 0400  BP:   125/76   Pulse: 74  91   Resp:   17   Temp:   (!) 97.5 F (36.4 C)   TempSrc:   Oral   SpO2: 100% 95% 91% 94%  Weight:      Height:        Intake/Output Summary (Last 24 hours) at 08/11/2023 0750 Last data filed at 08/11/2023 0530 Gross per 24 hour  Intake 887.69 ml  Output 1810 ml  Net -922.31 ml   Filed Weights   08/04/23 1038 08/06/23 1857  Weight: 99.8 kg 96.9 kg    Examination:  General exam: Appears calm and comfortable  Respiratory system: Clear to auscultation. Respiratory effort normal. Cardiovascular system: S1 & S2 heard, RRR. No JVD, murmurs, rubs, gallops or clicks. No pedal edema. Gastrointestinal system: Abdomen is nondistended, soft and nontender. No organomegaly or masses felt. Normal bowel sounds heard. Central nervous  system: Alert and oriented. No focal neurological deficits. Extremities: Symmetric 5 x 5 power. Skin: No rashes, lesions or ulcers.   Data Reviewed: I have personally reviewed following labs and imaging studies  CBC: Recent Labs  Lab 08/07/23 2015 08/08/23 0425 08/09/23 0327 08/10/23 0418 08/11/23 0536  WBC 23.2* 24.9* 18.9* 10.0 10.7*  NEUTROABS 20.1* 21.8* 15.8* 7.1 7.8*  HGB 14.8 14.3 14.8 12.8* 13.1  HCT 43.3 41.6 43.6 38.5* 39.6  MCV 91.7 91.8 92.4 92.5 94.5  PLT 191 187 196 180 184   Basic Metabolic Panel: Recent Labs  Lab 08/04/23 1409 08/04/23 1655 08/05/23 0305 08/06/23 0214 08/07/23 0448 08/08/23 0425 08/09/23 0327 08/10/23 0418 08/11/23 0536  NA 139  --    < > 138 136 136 136 135 139  K 3.3*  --    < > 4.4 3.8 3.8 3.6 3.2* 4.1  CL 95*  --    < > 103 98 99 95* 98 102  CO2 30  --    < >  23 27 24 25 27 24   GLUCOSE 252*  --    < > 149* 184* 176* 172* 164* 152*  BUN 11  --    < > 23 21 22  32* 37* 29*  CREATININE 1.25*  --    < > 1.27* 1.20 1.29* 1.49* 1.46* 1.22  CALCIUM 9.1  --    < > 8.6* 8.7* 8.5* 8.7* 8.3* 8.9  MG 1.6* 1.6*  --  2.0  --   --   --   --   --    < > = values in this interval not displayed.   GFR: Estimated Creatinine Clearance: 54.5 mL/min (by C-G formula based on SCr of 1.22 mg/dL). Liver Function Tests: Recent Labs  Lab 08/04/23 1108 08/07/23 1133 08/08/23 1030  AST 44* 30 18  ALT 26 33 24  ALKPHOS 67 67 59  BILITOT 0.8 0.9 0.7  PROT 5.6* 7.3 6.5  ALBUMIN 2.9* 3.5 3.0*   No results for input(s): "LIPASE", "AMYLASE" in the last 168 hours. Recent Labs  Lab 08/08/23 1030  AMMONIA 28   Coagulation Profile: Recent Labs  Lab 08/04/23 1108  INR 1.3*   Cardiac Enzymes: No results for input(s): "CKTOTAL", "CKMB", "CKMBINDEX", "TROPONINI" in the last 168 hours. BNP (last 3 results) No results for input(s): "PROBNP" in the last 8760 hours. HbA1C: No results for input(s): "HGBA1C" in the last 72 hours. CBG: Recent Labs  Lab  08/09/23 2149 08/10/23 0723 08/10/23 1122 08/10/23 1632 08/10/23 2113  GLUCAP 219* 156* 162* 182* 136*   Lipid Profile: No results for input(s): "CHOL", "HDL", "LDLCALC", "TRIG", "CHOLHDL", "LDLDIRECT" in the last 72 hours. Thyroid Function Tests: No results for input(s): "TSH", "T4TOTAL", "FREET4", "T3FREE", "THYROIDAB" in the last 72 hours.  Anemia Panel: No results for input(s): "VITAMINB12", "FOLATE", "FERRITIN", "TIBC", "IRON", "RETICCTPCT" in the last 72 hours. Sepsis Labs: Recent Labs  Lab 08/04/23 1051 08/04/23 1409 08/04/23 1655  LATICACIDVEN 2.9* 2.0* 1.8    Recent Results (from the past 240 hours)  Urine Culture (for pregnant, neutropenic or urologic patients or patients with an indwelling urinary catheter)     Status: Abnormal   Collection Time: 08/07/23  7:22 PM   Specimen: Urine, Clean Catch  Result Value Ref Range Status   Specimen Description URINE, CLEAN CATCH  Final   Special Requests   Final    NONE Performed at [facility], [address], [place], [place] [address]    Culture >=100,000 COLONIES/mL [facility] MORGANII (A)  Final   Report Status 08/10/2023 FINAL  Final   Organism ID, Bacteria MORGANELLA MORGANII (A)  Final      Susceptibility   Morganella morganii - MIC*    AMPICILLIN >=32 RESISTANT Resistant     CIPROFLOXACIN <=0.25 SENSITIVE Sensitive     GENTAMICIN <=1 SENSITIVE Sensitive     IMIPENEM 1 SENSITIVE Sensitive     NITROFURANTOIN 128 RESISTANT Resistant     TRIMETH/SULFA <=20 SENSITIVE Sensitive     AMPICILLIN/SULBACTAM >=32 RESISTANT Resistant     PIP/TAZO 16 SENSITIVE Sensitive ug/mL    * >=100,000 COLONIES/mL MORGANELLA MORGANII  Culture, blood (Routine X 2) w Reflex to ID Panel     Status: None (Preliminary result)   Collection Time: 08/08/23 10:32 AM   Specimen: BLOOD LEFT ARM  Result Value Ref Range Status   Specimen Description BLOOD LEFT ARM  Final   Special Requests   Final    BOTTLES DRAWN AEROBIC AND ANAEROBIC  Blood Culture results may  not be optimal due to an inadequate volume of blood received in culture bottles   Culture   Final    NO GROWTH 2 DAYS Performed at [facility], [address] [place], [place] [address]    Report Status PENDING  Incomplete  Culture, blood (Routine X 2) w Reflex to ID Panel     Status: None (Preliminary result)   Collection Time: 08/08/23 10:33 AM   Specimen: BLOOD RIGHT ARM  Result Value Ref Range Status   Specimen Description BLOOD RIGHT ARM  Final   Special Requests   Final    BOTTLES DRAWN AEROBIC ONLY Blood Culture results may not be optimal due to an inadequate volume of blood received in culture bottles   Culture   Final    NO GROWTH 2 DAYS Performed at [facility], [address] [place], [place] [address]    Report Status PENDING  Incomplete     Radiology Studies: No results found.  Scheduled Meds:  allopurinol  300 mg Oral Daily   apixaban  5 mg Oral BID   aspirin  81 mg Oral Daily   atorvastatin  80 mg Oral Daily   Chlorhexidine Gluconate Cloth  6 each Topical Daily   ciprofloxacin  250 mg Oral BID   clopidogrel  75 mg Oral Daily   famotidine  20 mg Oral BID   fluticasone furoate-vilanterol  1 puff Inhalation Daily   And   umeclidinium bromide  1 puff Inhalation Daily   insulin aspart  0-9 Units Subcutaneous TID WC   loratadine  10 mg Oral Daily   losartan  25 mg Oral Daily   melatonin  3 mg Oral QHS   metoprolol succinate  50 mg Oral BID   nicotine  21 mg Transdermal Daily   sertraline  50 mg Oral Daily   sodium chloride flush  3 mL Intravenous Q12H   tamsulosin  0.4 mg Oral QPC supper   torsemide  20 mg Oral Daily   Continuous Infusions:     LOS: 7 days   Hughie Closs, MD [facility]  08/11/2023, 7:50 AM   *Please note that this is a verbal dictation therefore any spelling or grammatical errors are due to the "Dragon Medical One" system interpretation.  Please page via Amion and do not message via  secure chat for urgent patient care matters. Secure chat can be used for non urgent patient care matters.  How to contact the [facility] Attending or Consulting provider 7A - 7P or covering provider during after hours 7P -7A, for this patient?  Check the care team in [facility] and look for a) attending/consulting TRH provider listed and b) the [facility] team listed. Page or secure chat 7A-7P. Log into [email or web address] and use [facility]'s universal password to access. If you do not have the password, please contact the hospital operator. Locate the [facility] provider you are looking for under [facility] and page to a number that you can be directly reached. If you still have difficulty reaching the provider, please page the [facility] (Director on Call) for the Hospitalists listed on amion for assistance.

## 2023-08-11 NOTE — Progress Notes (Signed)
 Occupational Therapy Treatment Patient Details Name: Kevin Frank MRN: [id number] DOB: [birth year] Today's Date: 08/11/2023   History of present illness Pt is 83 yo [facility] EMS with inferior STEMI on 08/04/23. PMH: D HF, HTN, hyperlipidemia, CAD, Dm II, AAA s/p repair, lung cancer, COPD and OSA on 5L at home CPAP.   OT comments  Pt pleasant and cooperative throughout session. Focused on increased activity tolerance during ADL. Pt on 2L O2 throughout session, SpO2 dropped to 88% during mobility to toilet. Pt GCA with RW for mobility during session. Pt then able to complete sink level grooming with GCA and cues for task completion (using soap). Unsure of SpO2 at that time because it became saturated with water and fell off, once seated EOB and O2 sensor reapplied, SpO2 >93% on 2L O2. Wife present throughout and has no questions about ADL prior to DC. Her biggest concern is medicine management - referred her to RN staff and that new meds should be in DC orders. Educated on using a pill box and checking behind her husband to ensure accuracy of medication management. OT will continue to follow acutely HHOT still essential to maximize safety and independence in ADL and functional transfers.       If plan is discharge home, recommend the following:  A little help with walking and/or transfers;A little help with bathing/dressing/bathroom;Assistance with cooking/housework;Direct supervision/assist for medications management;Direct supervision/assist for financial management;Assist for transportation;Help with stairs or ramp for entrance   Equipment Recommendations  Other (comment);BSC/3in1 (other TBD based on pt progress)    Recommendations for Other Services      Precautions / Restrictions Precautions Precautions: Fall Restrictions Weight Bearing Restrictions Per Provider Order: No       Mobility Bed Mobility Overal bed mobility: Needs Assistance Bed Mobility: Supine to Sit      Supine to sit: Supervision, HOB elevated     General bed mobility comments: no assist needed    Transfers Overall transfer level: Needs assistance Equipment used: Rolling walker (2 wheels) Transfers: Sit to/from Stand Sit to Stand: Contact guard assist           General transfer comment: With cues for safety and hand placement/technique.     Balance Overall balance assessment: Needs assistance Sitting-balance support: No upper extremity supported, Feet supported Sitting balance-Leahy Scale: Good Sitting balance - Comments: sitting EOB   Standing balance support: Single extremity supported, During functional activity, Bilateral upper extremity supported Standing balance-Leahy Scale: Poor Standing balance comment: Light UE support just for balance, no overt LOB                           ADL either performed or assessed with clinical judgement   ADL Overall ADL's : Needs assistance/impaired     Grooming: Contact guard assist;Standing;Wash/dry hands Grooming Details (indicate cue type and reason): cues for soap Upper Body Bathing: Contact guard assist;Sitting;Cueing for safety;Cueing for compensatory techniques               Toilet Transfer: Contact guard assist;Ambulation;Comfort height toilet;Grab bars;Rolling walker (2 wheels) Toilet Transfer Details (indicate cue type and reason): into bathroom Toileting- Clothing Manipulation and Hygiene: Minimal assistance;Sit to/from stand;Cueing for compensatory techniques;Cueing for safety Toileting - Clothing Manipulation Details (indicate cue type and reason): rear peri care with wash cloth     Functional mobility during ADLs: Contact guard assist;Rolling walker (2 wheels) General ADL Comments: Pt presents with decreased activity tolerance, insight into  deficits affecting safety during functional tasks.    Extremity/Trunk Assessment              Water quality scientist Communication: Other (comment) (Pt is HOH) Factors Affecting Communication: Hearing impaired (Pt has and wears hearing aids)   Cognition Arousal: Alert Behavior During Therapy: WFL for tasks assessed/performed, Impulsive (Largely WFL with occasional impulsiveness with movement) Cognition: Cognition impaired   Orientation impairments: Time, Situation Awareness: Intellectual awareness impaired, Online awareness impaired Memory impairment (select all impairments): Short-term memory, Working [employer] fast streamer, Engineer, structural memory Attention impairment (select first level of impairment): Alternating attention Executive functioning impairment (select all impairments): Problem solving, Reasoning OT - Cognition Comments: Pleasant throughout session and following 1 and 2-step instructions consitently with cues. pt presents with decreased safety awareness and decreased insight into deficits.                 Following commands: Intact        Cueing   Cueing Techniques: Visual cues, Tactile cues (due to pt being [facility])  Exercises      Shoulder Instructions       General Comments wife present throughout session. Her biggest concern is medicine management at DC. Told her that RN staff would go over and it should be in her DC instructions.    Pertinent Vitals/ Pain       Pain Assessment Pain Assessment: No/denies pain  Home Living                                          Prior Functioning/Environment              Frequency  Min 1X/week        Progress Toward Goals  OT Goals(current goals can now be found in the care plan section)  Progress towards OT goals: Progressing toward goals  Acute Rehab OT Goals Patient Stated Goal: to get out of here and smoke a cigarette OT Goal Formulation: With patient/family Time For Goal Achievement: 08/20/23 Potential to Achieve Goals: Good ADL Goals Pt Will Perform Grooming: with modified  independence;standing Pt Will Perform Lower Body Bathing: with modified independence;sit to/from stand Pt Will Perform Lower Body Dressing: with modified independence;sit to/from stand Pt Will Transfer to Toilet: with modified independence;regular height toilet;ambulating (with least restrictive AD) Pt Will Perform Toileting - Clothing Manipulation and hygiene: sitting/lateral leans;sit to/from stand;with modified independence  Plan      Co-evaluation                 AM-PAC OT "6 Clicks" Daily Activity     Outcome Measure   Help from another person eating meals?: None Help from another person taking care of personal grooming?: A Little Help from another person toileting, which includes using toliet, bedpan, or urinal?: A Little Help from another person bathing (including washing, rinsing, drying)?: A Little Help from another person to put on and taking off regular upper body clothing?: A Little Help from another person to put on and taking off regular lower body clothing?: A Little 6 Click Score: 19    End of Session Equipment Utilized During Treatment: Oxygen (2L)  OT Visit Diagnosis: Unsteadiness on feet (R26.81);Other symptoms and signs involving cognitive function;Other (comment) (decreased activity tolerance)   Activity Tolerance Patient tolerated treatment well  Patient Left in bed;with call bell/phone within reach;with family/visitor present;with bed alarm set (EOB)   Nurse Communication Mobility status;Precautions        Time: 0981-1914 OT Time Calculation (min): 29 min  Charges: OT General Charges $OT Visit: 1 Visit OT Treatments $Self Care/Home Management : 23-37 mins  Nyoka Cowden OTR/L Acute Rehabilitation Services Office: [phone]   Evern Bio [facility] 08/11/2023, 11:15 AM

## 2023-08-11 NOTE — Discharge Summary (Addendum)
 Discharge Summary    Patient ID: Kevin Frank MRN: [id number]; DOB: 1941/05/07  Admit date: 08/04/2023 Discharge date: 08/11/2023  PCP:  Elfredia Nevins, MD   [facility] Providers Cardiologist:  Dina Rich, MD  Cardiology APP:  Beatrice Lecher, PA-C       Discharge Diagnoses    Principal Problem:   STEMI (ST elevation myocardial infarction) [facility]) Active Problems:   S/P right coronary artery (RCA) stent placement   Urinary tract infection without hematuria   Atrial flutter (HCC)   Paroxysmal atrial fibrillation (HCC)   Transient alteration of awareness   Hypokalemia   Cigarette smoker   Atherosclerosis of native coronary artery of native heart without angina pectoris    Diagnostic Studies/Procedures    LHC 08/04/23:   Prox RCA to Mid RCA lesion is 99% stenosed.   A stent was successfully placed.   Post intervention, there is a 0% residual stenosis.   1.  99% mid right coronary artery lesion requiring complex PCI with multiple wires and GuideLiner with 3 overlapping drug-eluting stents.  The case was complicated by transient need for norepinephrine and vasopressin.  The patient received bicarbonate and Lasix 10 mg IV during the procedure.  All pressors were weaned at the conclusion of the procedure. 2.  Mild left-sided disease. 3.  Significant upper extremity tortuosity requiring destination sheath. 4.  LVEDP of 25 mmHg at the conclusion of the procedure and after Lasix had and administered. 5.  Due to inability to take p.o. during the procedure, the patient was treated with a cangrelor drip.  Transition to [facility] will be managed by pharmacy.   Recommendation: Dual antiplatelet therapy for 1 year and aggressive treatment of cardiovascular risk factors. _____________   Echo 08/05/23:  1. Left ventricular ejection fraction, by estimation, is 60 to 65%. The  left ventricle has normal function. The left ventricle has no regional  wall motion  abnormalities. There is mild concentric left ventricular  hypertrophy. Left ventricular diastolic  parameters are consistent with Grade II diastolic dysfunction  (pseudonormalization). Elevated left ventricular end-diastolic pressure.   2. Right ventricular systolic function is normal. The right ventricular  size is normal. There is normal pulmonary artery systolic pressure. The  estimated right ventricular systolic pressure is 30.8 mmHg.   3. The mitral valve is normal in structure. Mild mitral valve  regurgitation.   4. The aortic valve is tricuspid. Aortic valve regurgitation is not  visualized. Aortic valve sclerosis/calcification is present, without any  evidence of aortic stenosis.   5. The inferior vena cava is dilated in size with <50% respiratory  variability, suggesting right atrial pressure of 15 mmHg.     History of Present Illness     Kevin Frank is a 83 y.o. male with past medical history significant for chronic diastolic heart failure, hypertension, hyperlipidemia, CAD, DM2, AAA s/p repair, lung cancer, COPD, OSA on 5L with home CPAP, and ongoing tobacco abuse.  Echocardiogram 5/21 showed an EF 6035% and grade 1 DD.  He was last seen by cardiology on 03/2023 with no chest pain or heart failure symptoms.  He was recently admitted to [facility] for sepsis and hypoxia secondary to pneumonia on 05/25/2023 treated with broad-spectrum antibiotics and discharged on Levaquin for 7 days.  He presented to [facility] ED by EMS for acute inferior STEMI.  He was emergently taken to the Cath Lab for revascularization of his occluded RCA.  Hospital Course     Consultants: hospital medicine  Acute  inferior STEMI CAD Patient suffered an acute anterior STEMI on 08/04/2023.  Emergent heart catheterization revealed an RCA with severe proximal to mid vessel stenosis with ultimate placement of 3 overlapping DES: 3.0 x 12 mm x 3.   Lesion was difficult to cross due to tortuosity but eventually crossed with  a whisper wire.  He placed a 3.0 x 12 mm DES but subsequent imaging showed an edge dissection distally.  A second DES 3.0 x 12 mm stent was placed with subsequent edge dissection distal to the second stent necessitating placement of a third 3.0 x 12 mm DES in the distal vessel.  Case was complicated by transient bradycardia without significant hypotension.  He was treated with DAPT.  Continue aspirin 81 mg p.o. daily. Continue Plavix 75 mg p.o. daily. Continue Lipitor 80 mg p.o. daily. Continue Toprol-XL 50 mg p.o. daily. Continue losartan 25 mg p.o. daily. Continue torsemide 20 mg p.o. daily   Atrial flutter/atrial fibrillation - New diagnosis this hospitalization on 08/05/23 - Started on amiodarone for rhythm control strategy but complicated by lip and facial swelling as well as whole-body rash felt related to drug reaction.  Amiodarone was discontinued.  Plan was to focus on rate control strategy for now.  He was started on anticoagulation with Eliquis 5 mg twice daily -appropriately dosed, Brilinta transitioned to plavix for triple therapy. - If he remains in atrial fibrillation at follow-up, can consider outpatient DCCV after 4 weeks of uninterrupted anticoagulation.   Chronic diastolic heart failure Echocardiogram this admission demonstrated an LVEF seizure 65%, grade 2 DD, mild MR, and right atrial pressure of 15 mmHg LVEDP was 25 mmHg at the time of his heart catheterization - SGLT2 stopped due to UTI - Continue 20 mg torsemide daily   UTI - Leukocytosis associated with UA consistent with UTI - Urine culture with greater than 100,000 colonies of Morganella Morganii - Antibiotic was transitioned to ciprofloxacin for which he will continue another 4 doses at discharge   AMS/delirium - Suspected secondary to UTI and Xanax use - Patient has been alert and oriented since the morning of 2/60/2025   Urinary retention History of BPH - Required In-and-Out catheter on 08/09/2023 -  Started on Flomax - Urinary retention resolved with increased mobility - Will discharge on Flomax once daily with follow-up with urology   Maculopapular rash - Felt to be a drug rash, developed this all over his body after starting amiodarone - Started on cetirizine and famotidine with as needed Benadryl - Rash completely resolved on 08/10/2023 - Pharm.D. has recommended loratadine 10 mg daily and famotidine 20 mg p.o. twice daily - Will need to follow-up with PCP +/- dermatology   Current tobacco abuse COPD Reiterated the need for cessation. On nicotine patch.   Hyperlipidemia with LDL goal less than 70 - May consider lower LDL goal given ongoing tobacco abuse - Discharged on 80 mg Lipitor   OSA on CPAP - Continue   Patient seen and examined by Dr. Odis Hollingshead in follow-up stable for discharge.  Follow-up has been arranged.       Did the patient have an acute coronary syndrome (MI, NSTEMI, STEMI, etc) this admission?:  Yes                               AHA/ACC ACS Clinical Performance & Quality Measures: Aspirin prescribed? - Yes ADP Receptor Inhibitor (Plavix/Clopidogrel, Brilinta/Ticagrelor or Effient/Prasugrel) prescribed (includes medically managed patients)? - Yes  Beta Blocker prescribed? - Yes High Intensity Statin (Lipitor 40-80mg  or Crestor 20-40mg ) prescribed? - Yes EF assessed during THIS hospitalization? - Yes For EF <40%, was ACEI/ARB prescribed? - Yes For EF <40%, Aldosterone Antagonist (Spironolactone or Eplerenone) prescribed? - Not Applicable (EF >/= 40%) Cardiac Rehab Phase II ordered (including medically managed patients)? - Yes       The patient will be scheduled for a TOC follow up appointment in 7-14 days.  A message has been sent to the [facility] and Scheduling Pool at the office where the patient should be seen for follow up.  _____________  Discharge Vitals Blood pressure 124/79, pulse 96, temperature (!) 97.4 F (36.3 C), temperature source Oral,  resp. rate 18, height 5\' 10"  (1.778 m), weight 96.9 kg, SpO2 96%.  Filed Weights   08/04/23 1038 08/06/23 1857  Weight: 99.8 kg 96.9 kg    Labs & Radiologic Studies    CBC Recent Labs    08/10/23 0418 08/11/23 0536  WBC 10.0 10.7*  NEUTROABS 7.1 7.8*  HGB 12.8* 13.1  HCT 38.5* 39.6  MCV 92.5 94.5  PLT 180 184   Basic Metabolic Panel Recent Labs    13/24/40 0418 08/11/23 0536  NA 135 139  K 3.2* 4.1  CL 98 102  CO2 27 24  GLUCOSE 164* 152*  BUN 37* 29*  CREATININE 1.46* 1.22  CALCIUM 8.3* 8.9   Liver Function Tests No results for input(s): "AST", "ALT", "ALKPHOS", "BILITOT", "PROT", "ALBUMIN" in the last 72 hours.  No results for input(s): "LIPASE", "AMYLASE" in the last 72 hours. High Sensitivity Troponin:   Recent Labs  Lab 08/04/23 1108 08/04/23 1404  TROPONINIHS 1,030* 17,294*    BNP Invalid input(s): "POCBNP" D-Dimer No results for input(s): "DDIMER" in the last 72 hours. Hemoglobin A1C No results for input(s): "HGBA1C" in the last 72 hours. Fasting Lipid Panel No results for input(s): "CHOL", "HDL", "LDLCALC", "TRIG", "CHOLHDL", "LDLDIRECT" in the last 72 hours. Thyroid Function Tests No results for input(s): "TSH", "T4TOTAL", "T3FREE", "THYROIDAB" in the last 72 hours.  Invalid input(s): "FREET3" _____________  DG Chest 2 View Result Date: 08/07/2023 CLINICAL DATA:  Leukocytosis. EXAM: CHEST - 2 VIEW COMPARISON:  05/26/2023 and chest CT 07/02/2023 FINDINGS: Heart size is upper limits of normal. Coarse lung markings likely represent chronic changes. Patchy densities at the medial right upper lung are similar to the prior chest CT. Atherosclerotic calcifications at the aortic arch. No large pleural effusions. IMPRESSION: Chronic lung changes without acute findings. Electronically Signed   By: Richarda Overlie M.D.   On: 08/07/2023 12:06   ECHOCARDIOGRAM COMPLETE Result Date: 08/05/2023    ECHOCARDIOGRAM REPORT   Patient Name:   Kevin Frank Date of  Exam: 08/05/2023 Medical Rec #:  [id number]        Height:       70.0 in Accession #:    [id number]       Weight:       220.0 lb Date of Birth:  March 06, 1941        BSA:          2.174 m Patient Age:    82 years         BP:           112/52 mmHg Patient Gender: M                HR:           52 bpm. Exam Location:  Inpatient Procedure: 2D  Echo, Color Doppler and Cardiac Doppler (Both Spectral and Color            Flow Doppler were utilized during procedure). Indications:    Acute MI i21.9  History:        Patient has prior history of Echocardiogram examinations, most                 recent 11/15/2019. CAD, COPD; Risk Factors:Hypertension,                 Diabetes, Dyslipidemia and Sleep Apnea.  Sonographer:    Irving Burton Senior RDCS Referring Phys: [id number] Orbie Pyo  Sonographer Comments: Technically difficult due to COPD IMPRESSIONS  1. Left ventricular ejection fraction, by estimation, is 60 to 65%. The left ventricle has normal function. The left ventricle has no regional wall motion abnormalities. There is mild concentric left ventricular hypertrophy. Left ventricular diastolic parameters are consistent with Grade II diastolic dysfunction (pseudonormalization). Elevated left ventricular end-diastolic pressure.  2. Right ventricular systolic function is normal. The right ventricular size is normal. There is normal pulmonary artery systolic pressure. The estimated right ventricular systolic pressure is 30.8 mmHg.  3. The mitral valve is normal in structure. Mild mitral valve regurgitation.  4. The aortic valve is tricuspid. Aortic valve regurgitation is not visualized. Aortic valve sclerosis/calcification is present, without any evidence of aortic stenosis.  5. The inferior vena cava is dilated in size with <50% respiratory variability, suggesting right atrial pressure of 15 mmHg. Comparison(s): Changes from prior study are noted. 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 FINDINGS  Left Ventricle: Left  ventricular ejection fraction, by estimation, is 60 to 65%. The left ventricle has normal function. The left ventricle has no regional wall motion abnormalities. Definity contrast agent was given IV to delineate the left ventricular  endocardial borders. Strain imaging was not performed. The left ventricular internal cavity size was normal in size. There is mild concentric left ventricular hypertrophy. Left ventricular diastolic parameters are consistent with Grade II diastolic dysfunction (pseudonormalization). Elevated left ventricular end-diastolic pressure. Right Ventricle: The right ventricular size is normal. No increase in right ventricular wall thickness. Right ventricular systolic function is normal. There is normal pulmonary artery systolic pressure. The tricuspid regurgitant velocity is 1.99 m/s, and  with an assumed right atrial pressure of 15 mmHg, the estimated right ventricular systolic pressure is 30.8 mmHg. Left Atrium: Left atrial size was normal in size. Right Atrium: Right atrial size was normal in size. Pericardium: There is no evidence of pericardial effusion. Presence of epicardial fat layer. Mitral Valve: The mitral valve is normal in structure. Mild mitral valve regurgitation. Tricuspid Valve: The tricuspid valve is normal in structure. Tricuspid valve regurgitation is trivial. Aortic Valve: The aortic valve is tricuspid. Aortic valve regurgitation is not visualized. Aortic valve sclerosis/calcification is present, without any evidence of aortic stenosis. Pulmonic Valve: The pulmonic valve was normal in structure. Pulmonic valve regurgitation is trivial. Aorta: The ascending aorta was not well visualized and the aortic root is normal in size and structure. Venous: The inferior vena cava is dilated in size with less than 50% respiratory variability, suggesting right atrial pressure of 15 mmHg. IAS/Shunts: No atrial level shunt detected by color flow Doppler. Additional Comments: 3D imaging  was not performed.  LEFT VENTRICLE PLAX 2D LVIDd:         5.00 cm   Diastology LVIDs:         3.80 cm   LV e' medial:  5.66 cm/s LV PW:         1.20 cm   LV E/e' medial:  17.8 LV IVS:        1.00 cm   LV e' lateral:   8.16 cm/s LVOT diam:     2.30 cm   LV E/e' lateral: 12.4 LV SV:         82 LV SV Index:   38 LVOT Area:     4.15 cm  RIGHT VENTRICLE RV S prime:     12.60 cm/s TAPSE (M-mode): 1.9 cm LEFT ATRIUM             Index        RIGHT ATRIUM           Index LA diam:        3.70 cm 1.70 cm/m   RA Area:     19.90 cm LA Vol (A2C):   55.8 ml 25.67 ml/m  RA Volume:   51.10 ml  23.51 ml/m LA Vol (A4C):   69.9 ml 32.16 ml/m LA Biplane Vol: 64.7 ml 29.77 ml/m  AORTIC VALVE LVOT Vmax:   102.00 cm/s LVOT Vmean:  70.700 cm/s LVOT VTI:    0.198 m  AORTA Ao Root diam: 3.40 cm MITRAL VALVE                TRICUSPID VALVE MV Area (PHT): 2.74 cm     TR Peak grad:   15.8 mmHg MV Decel Time: 277 msec     TR Vmax:        199.00 cm/s MV E velocity: 101.00 cm/s MV A velocity: 59.20 cm/s   SHUNTS MV E/A ratio:  1.71         Systemic VTI:  0.20 m                             Systemic Diam: 2.30 cm Zoila Shutter MD Electronically signed by Zoila Shutter MD Signature Date/Time: 08/05/2023/10:59:12 AM    Final    [place] EKG SITE RITE Result Date: 08/04/2023 If Site Rite image not attached, placement could not be confirmed due to current cardiac rhythm.  CARDIAC CATHETERIZATION Result Date: 08/04/2023   Prox RCA to Mid RCA lesion is 99% stenosed.   A stent was successfully placed.   Post intervention, there is a 0% residual stenosis. 1.  99% mid right coronary artery lesion requiring complex PCI with multiple wires and GuideLiner with 3 overlapping drug-eluting stents.  The case was complicated by transient need for norepinephrine and vasopressin.  The patient received bicarbonate and Lasix 10 mg IV during the procedure.  All pressors were weaned at the conclusion of the procedure. 2.  Mild left-sided disease. 3.  Significant  upper extremity tortuosity requiring destination sheath. 4.  LVEDP of 25 mmHg at the conclusion of the procedure and after Lasix had and administered. 5.  Due to inability to take p.o. during the procedure, the patient was treated with a cangrelor drip.  Transition to [facility] will be managed by pharmacy. Recommendation: Dual antiplatelet therapy for 1 year and aggressive treatment of cardiovascular risk factors.   Disposition   Pt is being discharged home today in good condition.  Follow-up Plans & Appointments     Follow-up Information     Elfredia Nevins, MD Follow up on 08/08/2023.   Specialty: Internal Medicine Why: Office will call the patient Contact information: [address] [place] [address] [phone]  [facility] Follow up.   Specialty: Home Health Services Why: They will call you to set up services usually 24-48 hours post discharge Contact information: [address] [place] [address] [phone]         Tereso Newcomer T, PA-C Follow up on 08/19/2023.   Specialties: Cardiology, Physician Assistant Why: 2:30pm Contact information: [address] [place] [address] [phone]                Discharge Instructions     Amb Referral to Cardiac Rehabilitation   Complete by: As directed    Diagnosis:  STEMI Coronary Stents     After initial evaluation and assessments completed: Virtual Based Care may be provided alone or in conjunction with Phase 2 Cardiac Rehab based on patient barriers.: Yes   Intensive Cardiac Rehabilitation (ICR) [facility] location only OR Traditional Cardiac Rehabilitation (TCR) *If criteria for ICR are not met will enroll in TCR [facility] only): Yes   Diet - low sodium heart healthy   Complete by: As directed    Discharge instructions   Complete by: As directed    No driving for 2 days. No lifting over 5 lbs for 1 week. No sexual activity for 1 week. Keep procedure site  clean & dry. If you notice increased pain, swelling, bleeding or pus, call/return!  You may shower, but no soaking baths/hot tubs/pools for 1 week.   Increase activity slowly   Complete by: As directed         Discharge Medications   Allergies as of 08/11/2023       Reactions   Amiodarone Hives, Rash        Medication List     STOP taking these medications    amLODipine 10 MG tablet Commonly known as: NORVASC   hydrALAZINE 100 MG tablet Commonly known as: APRESOLINE       TAKE these medications    albuterol (2.5 MG/3ML) 0.083% nebulizer solution Commonly known as: PROVENTIL Take 3 mLs (2.5 mg total) by nebulization every 6 (six) hours as needed for wheezing or shortness of breath.   albuterol 108 (90 Base) MCG/ACT inhaler Commonly known as: VENTOLIN HFA Use 4 puffs every 4-6 hours as needed for cough or wheeze   allopurinol 300 MG tablet Commonly known as: ZYLOPRIM Take 1 tablet (300 mg total) by mouth daily.   apixaban 5 MG Tabs tablet Commonly known as: ELIQUIS Take 1 tablet (5 mg total) by mouth 2 (two) times daily.   aspirin EC 81 MG tablet Take 1 tablet (81 mg total) by mouth daily. Swallow whole. Stop after 30 days.   atorvastatin 80 MG tablet Commonly known as: LIPITOR Take 1 tablet (80 mg total) by mouth daily. Start taking on: August 12, 2023 What changed:  medication strength how much to take   ciprofloxacin 250 MG tablet Commonly known as: CIPRO Take 1 tablet (250 mg total) by mouth 2 (two) times daily.   clopidogrel 75 MG tablet Commonly known as: PLAVIX Take 1 tablet (75 mg total) by mouth daily. Start taking on: August 12, 2023   EPINEPHrine 0.3 mg/0.3 mL Soaj injection Commonly known as: EPI-PEN Inject 0.3 mg into the muscle as needed for anaphylaxis.   famotidine 20 MG tablet Commonly known as: PEPCID Take 1 tablet (20 mg total) by mouth 2 (two) times daily.   loratadine 10 MG tablet Commonly known as: CLARITIN Take 1  tablet (10 mg total) by mouth daily.  Start taking on: August 12, 2023   losartan 25 MG tablet Commonly known as: COZAAR Take 1 tablet (25 mg total) by mouth daily. Start taking on: August 12, 2023 What changed:  medication strength how much to take   metFORMIN 500 MG tablet Commonly known as: GLUCOPHAGE Take 1,000 mg by mouth 2 (two) times daily.   metoprolol succinate 50 MG 24 hr tablet Commonly known as: TOPROL-XL Take 1 tablet (50 mg total) by mouth 2 (two) times daily. Take with or immediately following a meal. What changed:  medication strength how much to take when to take this additional instructions   Neuriva Plus Caps Take 1 tablet by mouth daily.   nicotine 21 mg/24hr patch Commonly known as: NICODERM CQ - dosed in mg/24 hours Place 1 patch (21 mg total) onto the skin daily. Start taking on: August 12, 2023   nitroGLYCERIN 0.4 MG SL tablet Commonly known as: NITROSTAT Place 1 tablet (0.4 mg total) under the tongue every 5 (five) minutes as needed for chest pain.   sertraline 50 MG tablet Commonly known as: ZOLOFT Take 1 tablet (50 mg total) by mouth daily.   tamsulosin 0.4 MG Caps capsule Commonly known as: FLOMAX Take 1 capsule (0.4 mg total) by mouth daily after supper.   torsemide 20 MG tablet Commonly known as: DEMADEX Take 1 tablet (20 mg total) by mouth daily.   Trelegy Ellipta 100-62.5-25 MCG/ACT Aepb Generic drug: Fluticasone-Umeclidin-Vilant Inhale 1 puff into the lungs daily.           Outstanding Labs/Studies    Duration of Discharge Encounter: APP Time: 25 minutes   Signed,  Micah Flesher, PA  ADDENDUM:   Patient seen and examined at bedside. I personally taken a history, examined the patient, reviewed relevant notes,  laboratory data / imaging studies.  I performed a substantive portion of this encounter and formulated the important aspects of the plan.  I agree with the APP's note, impression, and recommendations;  however, I have edited the note to reflect changes or salient points.   Patient seen and examined at bedside.  Wife is present. No events overnight. Patient wishes to be discharged home. Rash has resolved and no reoccurrence.   No longer experiencing UTI symptoms.  PHYSICAL EXAM: Today's Vitals   08/11/23 0400 08/11/23 0749 08/11/23 0848 08/11/23 1000  BP:  124/79    Pulse:  96    Resp:  18    Temp:  (!) 97.4 F (36.3 C)    TempSrc:  Oral    SpO2: 94% 96% 96%   Weight:      Height:      PainSc: 0-No pain   0-No pain   Body mass index is 30.65 kg/m.   Net IO Since Admission: -10,836.53 mL [08/11/23 1142]  Filed Weights   08/04/23 1038 08/06/23 1857  Weight: 99.8 kg 96.9 kg    General: Age-appropriate male, hemodynamically stable, no acute distress Lungs: Clear to auscultation bilaterally.  No wheezes rales or rhonchi's. Heart: Irregularly irregular, variable S1-S2, no murmur rubs or gallops appreciated Abdomen: Obese, soft, nontender, nondistended, positive bowel sounds in all 4 quadrants Extremities: No edema, no rash, range of motion preserved  EKG: (personally reviewed by me) No new EKG  Telemetry: (personally reviewed by me) Controlled atrial fibrillation   Impression:  Acute inferior STEMI status post PCI x 3 to the RCA -stable Chronic HFpEF-stable Newly discovered atrial fibrillation-stable on medical therapy Urinary tract infection-improving on antibiotics Urinary retention-improving on Flomax  Rash-resolved Cigarette smoking  Recommendations:  Acute inferior STEMI status post PCI x 3 to the RCA -stable Currently on dual antiplatelet therapy as well as anticoagulation for newly discovered atrial fibrillation. As discussed above continue triple therapy for 1 month thereafter discontinue aspirin. Reemphasized importance of complete smoking cessation, smokes 2 to 3 packs/day Reemphasized the importance of secondary prevention with focus on improving her  modifiable cardiovascular risk factors such as glycemic control, lipid management, blood pressure control, weight loss.  Chronic HFpEF: Stage C, NYHA class II. Currently on losartan 25 mg p.o. daily, can transition to [facility] follow-up. Currently on Toprol-XL 50 mg twice daily, at follow-up.  Given inferior STEMI bradycardia during this hospitalization.  Currently on torsemide 20 mg p.o. daily  Urinary tract infection: Currently being managed by internal medicine, recommending Cipro.  Please see internal medicine progress note for final recommendations.  Urinary retention: Discharged on Flomax.  Follow-up with primary care.  Plan of care discussed with internal medicine provider Dr. Jacqulyn Bath, patient, and wife.  Medication sent to transition of care pharmacy, and outpatient follow-up scheduled.  Further recommendations to follow as the case evolves.   This note was created using a voice recognition software as a result there may be grammatical errors inadvertently enclosed that do not reflect the nature of this encounter. Every attempt is made to correct such errors.  Total time spent: 32 minutes.    Tessa Lerner, DO, [facility]  [address] [place], [place] [address] Pager: [phone] Office: [phone] 08/11/2023 11:42 AM

## 2023-08-11 NOTE — Plan of Care (Signed)

## 2023-08-13 LAB — CULTURE, BLOOD (ROUTINE X 2)
Culture: NO GROWTH
Culture: NO GROWTH

## 2023-08-13 NOTE — Telephone Encounter (Signed)
 I called the patient to see how he was doing. His mailbox was full so I was not able to leave a message.

## 2023-08-14 DIAGNOSIS — G4733 Obstructive sleep apnea (adult) (pediatric): Secondary | ICD-10-CM | POA: Diagnosis not present

## 2023-08-14 DIAGNOSIS — I480 Paroxysmal atrial fibrillation: Secondary | ICD-10-CM | POA: Diagnosis not present

## 2023-08-14 DIAGNOSIS — I4892 Unspecified atrial flutter: Secondary | ICD-10-CM | POA: Diagnosis not present

## 2023-08-14 DIAGNOSIS — I2510 Atherosclerotic heart disease of native coronary artery without angina pectoris: Secondary | ICD-10-CM | POA: Diagnosis not present

## 2023-08-14 DIAGNOSIS — I5022 Chronic systolic (congestive) heart failure: Secondary | ICD-10-CM | POA: Diagnosis not present

## 2023-08-14 DIAGNOSIS — E119 Type 2 diabetes mellitus without complications: Secondary | ICD-10-CM | POA: Diagnosis not present

## 2023-08-14 DIAGNOSIS — I110 Hypertensive heart disease with heart failure: Secondary | ICD-10-CM | POA: Diagnosis not present

## 2023-08-14 DIAGNOSIS — J4489 Other specified chronic obstructive pulmonary disease: Secondary | ICD-10-CM | POA: Diagnosis not present

## 2023-08-14 DIAGNOSIS — I213 ST elevation (STEMI) myocardial infarction of unspecified site: Secondary | ICD-10-CM | POA: Diagnosis not present

## 2023-08-18 DIAGNOSIS — J4489 Other specified chronic obstructive pulmonary disease: Secondary | ICD-10-CM | POA: Diagnosis not present

## 2023-08-18 DIAGNOSIS — I2510 Atherosclerotic heart disease of native coronary artery without angina pectoris: Secondary | ICD-10-CM | POA: Diagnosis not present

## 2023-08-18 DIAGNOSIS — G4733 Obstructive sleep apnea (adult) (pediatric): Secondary | ICD-10-CM | POA: Diagnosis not present

## 2023-08-18 DIAGNOSIS — I5022 Chronic systolic (congestive) heart failure: Secondary | ICD-10-CM | POA: Diagnosis not present

## 2023-08-18 DIAGNOSIS — I4892 Unspecified atrial flutter: Secondary | ICD-10-CM | POA: Diagnosis not present

## 2023-08-18 DIAGNOSIS — I110 Hypertensive heart disease with heart failure: Secondary | ICD-10-CM | POA: Diagnosis not present

## 2023-08-18 DIAGNOSIS — E119 Type 2 diabetes mellitus without complications: Secondary | ICD-10-CM | POA: Diagnosis not present

## 2023-08-18 DIAGNOSIS — I213 ST elevation (STEMI) myocardial infarction of unspecified site: Secondary | ICD-10-CM | POA: Diagnosis not present

## 2023-08-18 DIAGNOSIS — I480 Paroxysmal atrial fibrillation: Secondary | ICD-10-CM | POA: Diagnosis not present

## 2023-08-19 ENCOUNTER — Encounter: Payer: Self-pay | Admitting: Physician Assistant

## 2023-08-19 ENCOUNTER — Ambulatory Visit: Payer: Medicare HMO | Attending: Physician Assistant | Admitting: Physician Assistant

## 2023-08-19 ENCOUNTER — Ambulatory Visit: Payer: Medicare HMO | Admitting: Physician Assistant

## 2023-08-19 VITALS — BP 129/80 | HR 86 | Ht 70.0 in | Wt 220.2 lb

## 2023-08-19 DIAGNOSIS — I2119 ST elevation (STEMI) myocardial infarction involving other coronary artery of inferior wall: Secondary | ICD-10-CM | POA: Diagnosis not present

## 2023-08-19 DIAGNOSIS — I4819 Other persistent atrial fibrillation: Secondary | ICD-10-CM

## 2023-08-19 DIAGNOSIS — I5032 Chronic diastolic (congestive) heart failure: Secondary | ICD-10-CM | POA: Diagnosis not present

## 2023-08-19 DIAGNOSIS — I10 Essential (primary) hypertension: Secondary | ICD-10-CM

## 2023-08-19 DIAGNOSIS — I714 Abdominal aortic aneurysm, without rupture, unspecified: Secondary | ICD-10-CM | POA: Diagnosis not present

## 2023-08-19 DIAGNOSIS — E782 Mixed hyperlipidemia: Secondary | ICD-10-CM

## 2023-08-19 DIAGNOSIS — F17200 Nicotine dependence, unspecified, uncomplicated: Secondary | ICD-10-CM

## 2023-08-19 NOTE — Assessment & Plan Note (Signed)
 He remains in atrial fibrillation.  He seems to be asymptomatic with this.  Plan was to continue anticoagulation uninterrupted for 4 weeks then proceed with cardioversion.  He has only been on Eliquis for 1 week.  As noted above he did have an allergy to amiodarone.  His rash has resolved.  His rate is well-controlled on current therapy. - Continue Eliquis 5 mg twice daily - Continue metoprolol succinate 50 mg daily - Arrange follow-up with Dr. Wyline Mood or Sharlene Dory, NP in 4 weeks - He will need to be scheduled for elective cardioversion at next office visit if he remains in atrial fibrillation

## 2023-08-19 NOTE — Assessment & Plan Note (Signed)
 Lipitor increased to 80 mg due to recent MI.   - Continue Lipitor 80 mg daily - Fasting lipids and LFTs will need to be scheduled at next visit

## 2023-08-19 NOTE — Assessment & Plan Note (Signed)
 Echocardiogram 08/05/2023 with ejection fraction 60-65% and moderate diastolic dysfunction.  Volume status appears stable. NYHA Class IIb symptoms. - Continue torsemide 20 mg daily - Consider MRA +/- SGLT2 inhibitor if volume becomes difficult to control

## 2023-08-19 NOTE — Progress Notes (Signed)
 Cardiology Office Note:    Date:  08/19/2023  ID:  FREDI GEILER, DOB 10-10-1940, MRN [id number] PCP: Elfredia Nevins, MD  [facility] Providers Cardiologist:  Dina Rich, MD Cardiology APP:  Beatrice Lecher, PA-C       Patient Profile:      Coronary artery disease  Inferior STEMI 07/2023 s/p overlapping DES x 3 (3 x 12 mm, 3 x 12 mm, 3 x 12 mm) to the mid RCA LHC 08/04/2023: Proximal-mid RCA 99; LAD mild diffuse disease Atrial fibrillation/flutter Dx 07/2023 (HFpEF) heart failure with preserved ejection fraction  TTE 08/05/2023: EF 60-65, no RWMA, mild LVH, GRII DD, normal RVSF, normal PASP, RVSP 30.8, mild MR, AV sclerosis, RAP 15 Hypertension  Hyperlipidemia  Diabetes mellitus  Abdominal aortic aneurysm s/p repair (FEVAR) Dr. Myra Gianotti S/p L renal artery PTA post op due to AKI Lung CA Poor candidate for surgery; s/p XRT  Chronic Obstructive Pulmonary Disease  OSA on CPAP Tobacco use  Ascending aorta dilation            Discussed the use of AI scribe software for clinical note transcription with the patient, who gave verbal consent to proceed. History of Present Illness Kevin Frank is a 83 y.o. male who returns for posthospitalization follow-up.  He was last seen by Dr. Wyline Mood in the eating office in October 2024.  He had been admitted to [facility] in December 2024 with pneumonia and sepsis.  He was admitted 2/11-2/18 with an inferior STEMI.  Emergent cardiac catheterization demonstrated severe proximal RCA stenosis which was treated with 3 overlapping DES.  He was noted to be in atrial fibrillation/flutter.  He was placed on amiodarone.  This was discontinued secondary to allergy (lip and facial swelling, body rash).  He was placed on Eliquis for anticoagulation along with Plavix and aspirin for triple therapy.  Plan was to pursue cardioversion after 4 weeks of uninterrupted anticoagulation.  EF was normal by echocardiogram.  Patient developed a urinary tract  infection and is SGLT2 inhibitor was stopped.  He was placed on Flomax for urinary retention.  He is here with his wife. He has not had any further chest discomfort, pressure, or heaviness since the procedure. No dizziness or syncope since being discharged from the hospital one week ago. He is able to lie flat at night without difficulty and has not noticed any leg swelling. He has not observed any signs of bleeding such as black or bloody stools. His rash has resolved. He is not using nicotine patches and continues to smoke. His weight has been stable, with a slight loss of three to four pounds since discharge. He has been weighing himself daily, with a noted increase when fully dressed, but otherwise stable weight. He continues to smoke.  Review of Systems  Gastrointestinal:  Negative for hematochezia and melena.  Genitourinary:  Negative for hematuria.  -See HPI     Studies Reviewed:   EKG Interpretation Date/Time:  Wednesday August 19 2023 14:57:46 EST Ventricular Rate:  86 PR Interval:    QRS Duration:  102 QT Interval:  370 QTC Calculation: 442 R Axis:   -30  Text Interpretation: Atrial fibrillation Left axis deviation Incomplete right bundle branch block Inferior infarct T wave inversion Inferior leads No significant change since last tracing Confirmed by Tereso Newcomer [phone]) on 08/19/2023 3:20:44 PM    Results LABS - Chart Review K: 4.1 (08/11/2023) Cr: 1.22 (08/11/2023) ALT: 24 (08/08/2023) Hb: 13.1 (08/11/2023)   Risk Assessment/Calculations:  CHA2DS2-VASc Score = 6   This indicates a 9.7% annual risk of stroke. The patient's score is based upon: CHF History: 1 HTN History: 1 Diabetes History: 1 Stroke History: 0 Vascular Disease History: 1 Age Score: 2 Gender Score: 0            Physical Exam:   VS:  BP 129/80   Pulse 86   Ht 5\' 10"  (1.778 m)   Wt 220 lb 3.2 oz (99.9 kg)   SpO2 92%   BMI 31.60 kg/m    Wt Readings from Last 3 Encounters:  08/19/23 220  lb 3.2 oz (99.9 kg)  08/06/23 213 lb 9.6 oz (96.9 kg)  07/09/23 219 lb 12.8 oz (99.7 kg)    Constitutional:      Appearance: Healthy appearance. Not in distress.  Pulmonary:     Breath sounds: Normal breath sounds. No wheezing. No rales.  Cardiovascular:     Normal rate. Irregularly irregular rhythm.     Murmurs: There is no murmur.     Comments: R wrist w/o hematoma Edema:    Peripheral edema absent.  Abdominal:     Palpations: Abdomen is soft.  Skin:    General: Skin is warm and dry.        Assessment and Plan:   Assessment & Plan Acute ST elevation myocardial infarction (STEMI) involving other coronary artery of inferior wall (HCC) S/p inferior STEMI treated with 3 overlapping drug-eluting stents (DES) in the RCA.  He is currently asymptomatic.recurrent chest pain to suggest angina.  We discussed the importance of antiplatelet therapy.  I have encouraged cardiac rehabilitation. - Continue Eliquis 5 mg twice daily - Continue Plavix 75 mg daily - Continue Lipitor 80 mg daily - Continue nitroglycerin as needed - Continue aspirin 81 mg daily - Stop aspirin 30 days post-MI (September 01, 2023) - He is ready to start cardiac rehabilitation.   - We discussed the importance of quitting smoking Chronic heart failure with preserved ejection fraction (HFpEF) (HCC) Echocardiogram 08/05/2023 with ejection fraction 60-65% and moderate diastolic dysfunction.  Volume status appears stable. NYHA Class IIb symptoms. - Continue torsemide 20 mg daily - Consider MRA +/- SGLT2 inhibitor if volume becomes difficult to control Persistent atrial fibrillation (HCC) He remains in atrial fibrillation.  He seems to be asymptomatic with this.  Plan was to continue anticoagulation uninterrupted for 4 weeks then proceed with cardioversion.  He has only been on Eliquis for 1 week.  As noted above he did have an allergy to amiodarone.  His rash has resolved.  His rate is well-controlled on current therapy. -  Continue Eliquis 5 mg twice daily - Continue metoprolol succinate 50 mg daily - Arrange follow-up with Dr. Wyline Mood or Sharlene Dory, NP in 4 weeks - He will need to be scheduled for elective cardioversion at next office visit if he remains in atrial fibrillation  Mixed hyperlipidemia Lipitor increased to 80 mg due to recent MI.   - Continue Lipitor 80 mg daily - Fasting lipids and LFTs will need to be scheduled at next visit Essential hypertension, benign Blood pressure is well-controlled with current medications. - Continue losartan 25 mg daily - Continue metoprolol succinate 50 mg daily Abdominal aortic aneurysm (AAA) without rupture, unspecified part (HCC) Continue follow-up with vascular surgery as planned Current smoker Patient continues to smoke. Discussed the importance of quitting to reduce the risk of future MIs.  - Encourage smoking cessation       Cardiac Rehabilitation Eligibility Assessment  The  patient is ready to start cardiac rehabilitation from a cardiac standpoint.     Dispo:  Return in about 4 weeks (around 09/16/2023) for Routine Follow Up w/ Dr. Wyline Mood or Sharlene Dory, NP.  Signed, Tereso Newcomer, PA-C

## 2023-08-19 NOTE — Patient Instructions (Addendum)
 Medication Instructions:  Your physician recommends that you continue on your current medications as directed. Please refer to the Current Medication list given to you today.  *If you need a refill on your cardiac medications before your next appointment, please call your pharmacy*   Lab Work: None ordered  If you have labs (blood work) drawn today and your tests are completely normal, you will receive your results only by: MyChart Message (if you have MyChart) OR A paper copy in the mail If you have any lab test that is abnormal or we need to change your treatment, we will call you to review the results.   Testing/Procedures: None ordered   Follow-Up: At [facility], you and your health needs are our priority.  As part of our continuing mission to provide you with exceptional heart care, we have created designated Provider Care Teams.  These Care Teams include your primary Cardiologist (physician) and Advanced Practice Providers (APPs -  Physician Assistants and Nurse Practitioners) who all work together to provide you with the care you need, when you need it.  We recommend signing up for the patient portal called "MyChart".  Sign up information is provided on this After Visit Summary.  MyChart is used to connect with patients for Virtual Visits (Telemedicine).  Patients are able to view lab/test results, encounter notes, upcoming appointments, etc.  Non-urgent messages can be sent to your provider as well.   To learn more about what you can do with MyChart, go to [email or web address].    Your next appointment:   09/28/23 ARRIVE AT 3:45   Provider:   Sharlene Dory, NP      Other Instructions AFTER MARCH 11, YOU CAN STOP THE ASPIRIN

## 2023-08-19 NOTE — Assessment & Plan Note (Signed)
Continue follow-up with vascular surgery as planned. ?

## 2023-08-19 NOTE — Assessment & Plan Note (Signed)
 Patient continues to smoke. Discussed the importance of quitting to reduce the risk of future MIs.  - Encourage smoking cessation

## 2023-08-20 ENCOUNTER — Other Ambulatory Visit (HOSPITAL_COMMUNITY): Payer: Self-pay

## 2023-08-20 DIAGNOSIS — Z955 Presence of coronary angioplasty implant and graft: Secondary | ICD-10-CM

## 2023-08-20 DIAGNOSIS — I2111 ST elevation (STEMI) myocardial infarction involving right coronary artery: Secondary | ICD-10-CM

## 2023-08-24 DIAGNOSIS — E1165 Type 2 diabetes mellitus with hyperglycemia: Secondary | ICD-10-CM | POA: Diagnosis not present

## 2023-08-24 DIAGNOSIS — E6609 Other obesity due to excess calories: Secondary | ICD-10-CM | POA: Diagnosis not present

## 2023-08-24 DIAGNOSIS — I219 Acute myocardial infarction, unspecified: Secondary | ICD-10-CM | POA: Diagnosis not present

## 2023-08-24 DIAGNOSIS — G4733 Obstructive sleep apnea (adult) (pediatric): Secondary | ICD-10-CM | POA: Diagnosis not present

## 2023-08-24 DIAGNOSIS — I714 Abdominal aortic aneurysm, without rupture, unspecified: Secondary | ICD-10-CM | POA: Diagnosis not present

## 2023-08-24 DIAGNOSIS — E1159 Type 2 diabetes mellitus with other circulatory complications: Secondary | ICD-10-CM | POA: Diagnosis not present

## 2023-08-24 DIAGNOSIS — I10 Essential (primary) hypertension: Secondary | ICD-10-CM | POA: Diagnosis not present

## 2023-08-24 DIAGNOSIS — Z6831 Body mass index (BMI) 31.0-31.9, adult: Secondary | ICD-10-CM | POA: Diagnosis not present

## 2023-08-24 DIAGNOSIS — J449 Chronic obstructive pulmonary disease, unspecified: Secondary | ICD-10-CM | POA: Diagnosis not present

## 2023-08-28 ENCOUNTER — Ambulatory Visit

## 2023-08-28 DIAGNOSIS — J4550 Severe persistent asthma, uncomplicated: Secondary | ICD-10-CM | POA: Diagnosis not present

## 2023-08-31 DIAGNOSIS — I5022 Chronic systolic (congestive) heart failure: Secondary | ICD-10-CM | POA: Diagnosis not present

## 2023-08-31 DIAGNOSIS — I2510 Atherosclerotic heart disease of native coronary artery without angina pectoris: Secondary | ICD-10-CM | POA: Diagnosis not present

## 2023-08-31 DIAGNOSIS — I110 Hypertensive heart disease with heart failure: Secondary | ICD-10-CM | POA: Diagnosis not present

## 2023-08-31 DIAGNOSIS — I213 ST elevation (STEMI) myocardial infarction of unspecified site: Secondary | ICD-10-CM | POA: Diagnosis not present

## 2023-09-04 DIAGNOSIS — J4489 Other specified chronic obstructive pulmonary disease: Secondary | ICD-10-CM | POA: Diagnosis not present

## 2023-09-04 DIAGNOSIS — E119 Type 2 diabetes mellitus without complications: Secondary | ICD-10-CM | POA: Diagnosis not present

## 2023-09-04 DIAGNOSIS — I480 Paroxysmal atrial fibrillation: Secondary | ICD-10-CM | POA: Diagnosis not present

## 2023-09-04 DIAGNOSIS — I5022 Chronic systolic (congestive) heart failure: Secondary | ICD-10-CM | POA: Diagnosis not present

## 2023-09-04 DIAGNOSIS — G4733 Obstructive sleep apnea (adult) (pediatric): Secondary | ICD-10-CM | POA: Diagnosis not present

## 2023-09-04 DIAGNOSIS — I110 Hypertensive heart disease with heart failure: Secondary | ICD-10-CM | POA: Diagnosis not present

## 2023-09-04 DIAGNOSIS — I213 ST elevation (STEMI) myocardial infarction of unspecified site: Secondary | ICD-10-CM | POA: Diagnosis not present

## 2023-09-04 DIAGNOSIS — I4892 Unspecified atrial flutter: Secondary | ICD-10-CM | POA: Diagnosis not present

## 2023-09-04 DIAGNOSIS — I2510 Atherosclerotic heart disease of native coronary artery without angina pectoris: Secondary | ICD-10-CM | POA: Diagnosis not present

## 2023-09-05 ENCOUNTER — Other Ambulatory Visit (HOSPITAL_COMMUNITY): Payer: Self-pay

## 2023-09-13 DIAGNOSIS — I213 ST elevation (STEMI) myocardial infarction of unspecified site: Secondary | ICD-10-CM | POA: Diagnosis not present

## 2023-09-13 DIAGNOSIS — E119 Type 2 diabetes mellitus without complications: Secondary | ICD-10-CM | POA: Diagnosis not present

## 2023-09-13 DIAGNOSIS — I2510 Atherosclerotic heart disease of native coronary artery without angina pectoris: Secondary | ICD-10-CM | POA: Diagnosis not present

## 2023-09-13 DIAGNOSIS — I5022 Chronic systolic (congestive) heart failure: Secondary | ICD-10-CM | POA: Diagnosis not present

## 2023-09-13 DIAGNOSIS — I4892 Unspecified atrial flutter: Secondary | ICD-10-CM | POA: Diagnosis not present

## 2023-09-13 DIAGNOSIS — I110 Hypertensive heart disease with heart failure: Secondary | ICD-10-CM | POA: Diagnosis not present

## 2023-09-13 DIAGNOSIS — I480 Paroxysmal atrial fibrillation: Secondary | ICD-10-CM | POA: Diagnosis not present

## 2023-09-13 DIAGNOSIS — G4733 Obstructive sleep apnea (adult) (pediatric): Secondary | ICD-10-CM | POA: Diagnosis not present

## 2023-09-13 DIAGNOSIS — J4489 Other specified chronic obstructive pulmonary disease: Secondary | ICD-10-CM | POA: Diagnosis not present

## 2023-09-28 ENCOUNTER — Ambulatory Visit: Payer: Medicare HMO | Attending: Nurse Practitioner | Admitting: Nurse Practitioner

## 2023-09-28 ENCOUNTER — Encounter: Payer: Self-pay | Admitting: Nurse Practitioner

## 2023-09-28 ENCOUNTER — Ambulatory Visit

## 2023-09-28 VITALS — BP 118/74 | HR 54 | Ht 70.0 in | Wt 224.0 lb

## 2023-09-28 DIAGNOSIS — Z9889 Other specified postprocedural states: Secondary | ICD-10-CM

## 2023-09-28 DIAGNOSIS — I480 Paroxysmal atrial fibrillation: Secondary | ICD-10-CM | POA: Diagnosis not present

## 2023-09-28 DIAGNOSIS — E785 Hyperlipidemia, unspecified: Secondary | ICD-10-CM

## 2023-09-28 DIAGNOSIS — C3491 Malignant neoplasm of unspecified part of right bronchus or lung: Secondary | ICD-10-CM

## 2023-09-28 DIAGNOSIS — J449 Chronic obstructive pulmonary disease, unspecified: Secondary | ICD-10-CM

## 2023-09-28 DIAGNOSIS — I2510 Atherosclerotic heart disease of native coronary artery without angina pectoris: Secondary | ICD-10-CM | POA: Diagnosis not present

## 2023-09-28 DIAGNOSIS — I10 Essential (primary) hypertension: Secondary | ICD-10-CM | POA: Diagnosis not present

## 2023-09-28 DIAGNOSIS — R0609 Other forms of dyspnea: Secondary | ICD-10-CM | POA: Diagnosis not present

## 2023-09-28 DIAGNOSIS — Z8679 Personal history of other diseases of the circulatory system: Secondary | ICD-10-CM

## 2023-09-28 DIAGNOSIS — I5032 Chronic diastolic (congestive) heart failure: Secondary | ICD-10-CM

## 2023-09-28 DIAGNOSIS — Z720 Tobacco use: Secondary | ICD-10-CM

## 2023-09-28 MED ORDER — LOSARTAN POTASSIUM 25 MG PO TABS: 25.0000 mg | ORAL_TABLET | Freq: Every day | ORAL | 1 refills | Status: DC

## 2023-09-28 MED ORDER — CLOPIDOGREL BISULFATE 75 MG PO TABS: 75.0000 mg | ORAL_TABLET | Freq: Every day | ORAL | 1 refills | Status: DC

## 2023-09-28 MED ORDER — APIXABAN 5 MG PO TABS: 5.0000 mg | ORAL_TABLET | Freq: Two times a day (BID) | ORAL | 1 refills | Status: DC

## 2023-09-28 MED ORDER — TORSEMIDE 20 MG PO TABS: 20.0000 mg | ORAL_TABLET | Freq: Every day | ORAL | 1 refills | Status: DC

## 2023-09-28 MED ORDER — ATORVASTATIN CALCIUM 80 MG PO TABS: 80.0000 mg | ORAL_TABLET | Freq: Every day | ORAL | 1 refills | Status: DC

## 2023-09-28 MED ORDER — METOPROLOL SUCCINATE ER 50 MG PO TB24: 50.0000 mg | ORAL_TABLET | Freq: Two times a day (BID) | ORAL | 1 refills | Status: DC

## 2023-09-28 NOTE — Progress Notes (Unsigned)
 Office Visit    Patient Name: BRANSON KRANZ Date of Encounter: 09/28/2023 PCP:  Elfredia Nevins, MD [facility]  Cardiologist:  Dina Rich, MD  Advanced Practice Provider:  Beatrice Lecher, PA-C Electrophysiologist:  None   Chief Complaint and HPI    MAKALE PINDELL is a 83 y.o. male with a hx of CAD, aortic atherosclerosis, HFpEF, ascending aortic dilatation, hypertension, hyperlipidemia, type 2 diabetes, history of tobacco abuse, asthma/COPD, history of lung cancer, abdominal aortic aneurysm, and osteoarthritis, who presents today for scheduled follow-up.    Previous patient of Dr. Excell Seltzer.     Last seen by Dr. Dina Rich on April 22, 2023. Was overall doing well, however BP was elevated. Medication changed to Hydralazine 100 mg BID. Atorvastatin was increased to 40 mg daily due to LDL above goal.    Hospital admission at [facility] 05/2023 due to sepsis secondary to PNA. Tx with ABX.   Hospitalized in February 2025 due to STEMI, emergently taken to cath lab. Underwent heart cath on 08/04/2023 and received successful complex 3 overlapping DES to mid RCA. Case complicated by transient bradycardia without significant hypotension, tx with DAPT.  Echo revealed normal LVEF, mild LVH, grade 2 DD, normal PASP, mild MR. Newly dx with A-flutter/A-fib this admission, started on Amiodarone, however could not tolerate (had rash and lip/facial swelling), with plan for focus on rate control. Started on Eliquis 5 mg BID, transitioned from Brilinta to Plavix for triple therapy. Was recommended could pursue DCCV after 4 weeks if uninterrupted OAC therapy and remained in A-fib. Hospital course also noted by UTI, SGLT2i stopped, did have some AMS/delirium d/t this.   Last seen by Tereso Newcomer, PA-C on August 19, 2023. Was overall doing well at the time, rash resolved. Was continuing to smoke. Stable weight. Was found to be ready to start cardiac rehab. Instructed to stop  Aspirin 30 days post MI. Remained in A-fib and was recommended to consider DCCV at next office visit if he remained in A-fib.   Today he presents for scheduled follow-up with his wife. Pt says he is doing well and denies any acute cardiac complaints or issues. Wife confirms he is using oxygen at night (5 liters). His activity is limited per his wife's report, says he is getting his days and nights confused. Notes stable DOE. Wife says he has refused cardiac rehab. Weight is stable. Denies any chest pain, palpitations, syncope, presyncope, dizziness, orthopnea, PND, swelling or significant weight changes, acute bleeding, or claudication. Continues to smoke.    EKGs/Labs/Other Studies Reviewed:   The following studies were reviewed today:   EKG:   EKG Interpretation Date/Time:  Monday September 28 2023 15:59:58 EDT Ventricular Rate:  54 PR Interval:  124 QRS Duration:  110 QT Interval:  452 QTC Calculation: 428 R Axis:   -32  Text Interpretation: Sinus bradycardia Left axis deviation Incomplete right bundle branch block Possible Inferior infarct (cited on or before 06-Aug-2023) When compared with ECG of 19-Aug-2023 14:57, Sinus rhythm has replaced Atrial fibrillation Vent. rate has decreased BY  32 BPM Confirmed by Sharlene Dory [phone]) on 09/28/2023 4:09:26 PM   Echo 07/2023: 1. Left ventricular ejection fraction, by estimation, is 60 to 65%. The  left ventricle has normal function. The left ventricle has no regional  wall motion abnormalities. There is mild concentric left ventricular  hypertrophy. Left ventricular diastolic  parameters are consistent with Grade II diastolic dysfunction  (pseudonormalization). Elevated left ventricular end-diastolic pressure.  2. Right ventricular systolic function is normal. The right ventricular  size is normal. There is normal pulmonary artery systolic pressure. The  estimated right ventricular systolic pressure is 30.8 mmHg.   3. The mitral valve is  normal in structure. Mild mitral valve  regurgitation.   4. The aortic valve is tricuspid. Aortic valve regurgitation is not  visualized. Aortic valve sclerosis/calcification is present, without any  evidence of aortic stenosis.   5. The inferior vena cava is dilated in size with <50% respiratory  variability, suggesting right atrial pressure of 15 mmHg.   Comparison(s): Changes from prior study are noted. 11/15/2019: LVEF 60-65%,  dilated aorta to 41 mm.   Cardiac cath 07/2023:   Prox RCA to Mid RCA lesion is 99% stenosed.   A stent was successfully placed.   Post intervention, there is a 0% residual stenosis.   1.  99% mid right coronary artery lesion requiring complex PCI with multiple wires and GuideLiner with 3 overlapping drug-eluting stents.  The case was complicated by transient need for norepinephrine and vasopressin.  The patient received bicarbonate and Lasix 10 mg IV during the procedure.  All pressors were weaned at the conclusion of the procedure. 2.  Mild left-sided disease. 3.  Significant upper extremity tortuosity requiring destination sheath. 4.  LVEDP of 25 mmHg at the conclusion of the procedure and after Lasix had and administered. 5.  Due to inability to take p.o. during the procedure, the patient was treated with a cangrelor drip.  Transition to [facility] will be managed by pharmacy.   Recommendation: Dual antiplatelet therapy for 1 year and aggressive treatment of cardiovascular risk factors.  Echo 10/2019:  1. Normal LV systolic function; mild LVE; mild LVH; grade 1 diastolic dysfunction; mildly dilated ascending aorta.   2. Left ventricular ejection fraction, by estimation, is 60 to 65%. The left ventricle has normal function. The left ventricle has no regional wall motion abnormalities. The left ventricular internal cavity size was mildly dilated. There is mild left ventricular hypertrophy. Left ventricular diastolic parameters are consistent with Grade I diastolic  dysfunction (impaired relaxation). Elevated left atrial pressure.   3. Right ventricular systolic function is normal. The right ventricular size is normal. There is mildly elevated pulmonary artery systolic pressure.   4. The mitral valve is normal in structure. Trivial mitral valve  regurgitation. No evidence of mitral stenosis.   5. The aortic valve is tricuspid. Aortic valve regurgitation is not  visualized. Mild aortic valve sclerosis is present, with no evidence of aortic valve stenosis.   6. Aortic dilatation noted. There is mild dilatation of the ascending aorta measuring 41 mm.   7. The inferior vena cava is normal in size with greater than 50%  respiratory variability, suggesting right atrial pressure of 3 mmHg.  Myoview 10/2019: Nuclear stress EF: 60%. The left ventricular ejection fraction is normal (55-65%). This is a low risk study. There is no evidence of ischemia and no evidence of previous myocardial infarction. The study is normal. Recent Labs: 08/06/2023: Magnesium 2.0 08/07/2023: TSH 0.378 08/08/2023: ALT 24 08/11/2023: BUN 29; Creatinine, Ser 1.22; Hemoglobin 13.1; Platelets 184; Potassium 4.1; Sodium 139  Recent Lipid Panel    Component Value Date/Time   CHOL 82 08/04/2023 1108   TRIG 92 08/04/2023 1108   HDL 30 (L) 08/04/2023 1108   CHOLHDL 2.7 08/04/2023 1108   VLDL 18 08/04/2023 1108   LDLCALC 34 08/04/2023 1108   Review of Systems    All other systems reviewed  and are otherwise negative except as noted above.  Physical Exam    VS:  BP 118/74   Pulse (!) 54   Ht 5\' 10"  (1.778 m)   Wt 224 lb (101.6 kg)   SpO2 96%   BMI 32.14 kg/m  , BMI Body mass index is 32.14 kg/m.  Wt Readings from Last 3 Encounters:  09/28/23 224 lb (101.6 kg)  08/19/23 220 lb 3.2 oz (99.9 kg)  08/06/23 213 lb 9.6 oz (96.9 kg)     GEN: Obese, 83 y.o. male in no acute distress. HEENT: normal. Neck: Supple, no JVD, carotid bruits, or masses. Cardiac: S1/S2, irregular rhythm and  regular rate, no murmurs, rubs, or gallops. No clubbing, cyanosis, edema.  Radials/PT 2+ and equal bilaterally.  Respiratory:  Respirations regular and unlabored, clear to auscultation bilaterally, with scattered coarse breath sounds, nonproductive cough MS: No deformity or atrophy. Skin: Warm and dry, no rash. Neuro:  Strength and sensation are intact. Psych: Normal affect.  Assessment & Plan    Chronic HFpEF Echo 07/2023 revealed EF normal. Euvolemic and well compensated on exam. Stable DOE, stable over time per patient's report. Etiology multifactorial. Continue losartan, Toprol XL, and torsemide. Low sodium diet, fluid restriction <2L, and daily weights encouraged. Avoid SGLT2i - caused a UTI. Educated to contact our office for weight gain of 2 lbs overnight or 5 lbs in one week.   CAD, s/p inferior STEMI (s/p 3 overlapping DES to RCA in 07/2023) Stable with no anginal symptoms. No indication for ischemic evaluation. No longer on Aspirin. Continue Plavix, losartan, Toprol XL, atorvastatin, and NTG PRN. Heart healthy diet and regular cardiovascular exercise encouraged. Revisited cardiac rehab with him, he declines. Heart healthy diet and regular cardiovascular exercise as tolerated encouraged.   3. PAF Denies any tachycardia or palpitations. EKG shows sinus bradycardia, however on exam heart sounds irregular. Now having PAF. Continue Eliquis for stroke prevention. Continue Toprol XL for HR control. Agree to focus on rate control strategy. Could not tolerate Amiodarone - now listed as allergy. No indication for DCCV at this time as he has PAF. No medication changes at this time.   AAA, s/p repair  Last duplex with [facility] showed unchanged aortic diameter. Follow-up with Dr. Myra Gianotti with vascular surgery. Care and ED precautions discussed.   HTN Blood pressure stable.  No medication changes at this time. Discussed to monitor BP at home at least 2 hours after medications and sitting for 5-10 minutes.  Heart healthy diet and regular cardiovascular exercise encouraged.   HLD LDL 34 07/2023. Continue atorvastatin. Heart healthy diet and regular cardiovascular exercise encouraged. At next visit, plan to obtain FLP and CMET.   Tobacco abuse, COPD, DOE, lung cancer Smoking cessation encouraged and discussed.  Continue to follow-up with pulmonology.  DOE most likely d/t COPD and tobacco abuse. Continue to follow with PCP and Oncology.   Disposition: Will provide refills per wife's request. Follow up in 6-8 weeks with Dina Rich, MD or APP.  Signed, Sharlene Dory, NP

## 2023-09-28 NOTE — Patient Instructions (Signed)

## 2023-09-30 ENCOUNTER — Telehealth: Payer: Self-pay | Admitting: Allergy & Immunology

## 2023-09-30 ENCOUNTER — Ambulatory Visit (INDEPENDENT_AMBULATORY_CARE_PROVIDER_SITE_OTHER)

## 2023-09-30 DIAGNOSIS — J4550 Severe persistent asthma, uncomplicated: Secondary | ICD-10-CM

## 2023-09-30 MED ORDER — ALBUTEROL SULFATE (2.5 MG/3ML) 0.083% IN NEBU: 2.5000 mg | INHALATION_SOLUTION | Freq: Four times a day (QID) | RESPIRATORY_TRACT | 2 refills | Status: DC | PRN

## 2023-09-30 MED ORDER — ALBUTEROL SULFATE HFA 108 (90 BASE) MCG/ACT IN AERS: INHALATION_SPRAY | RESPIRATORY_TRACT | 2 refills | Status: DC

## 2023-09-30 MED ORDER — TRELEGY ELLIPTA 100-62.5-25 MCG/ACT IN AEPB: 1.0000 | INHALATION_SPRAY | Freq: Every day | RESPIRATORY_TRACT | 1 refills | Status: DC

## 2023-09-30 NOTE — Telephone Encounter (Signed)
 I called the patient's wife and left a message to callback to inform that medications have been sent in.

## 2023-09-30 NOTE — Telephone Encounter (Signed)
 Patient's spouse came in today requesting all of his medications be sent to [facility] mail order pharmacy.

## 2023-10-12 DIAGNOSIS — E119 Type 2 diabetes mellitus without complications: Secondary | ICD-10-CM | POA: Diagnosis not present

## 2023-10-12 DIAGNOSIS — J4489 Other specified chronic obstructive pulmonary disease: Secondary | ICD-10-CM | POA: Diagnosis not present

## 2023-10-12 DIAGNOSIS — I2510 Atherosclerotic heart disease of native coronary artery without angina pectoris: Secondary | ICD-10-CM | POA: Diagnosis not present

## 2023-10-12 DIAGNOSIS — I110 Hypertensive heart disease with heart failure: Secondary | ICD-10-CM | POA: Diagnosis not present

## 2023-10-12 DIAGNOSIS — G4733 Obstructive sleep apnea (adult) (pediatric): Secondary | ICD-10-CM | POA: Diagnosis not present

## 2023-10-12 DIAGNOSIS — I480 Paroxysmal atrial fibrillation: Secondary | ICD-10-CM | POA: Diagnosis not present

## 2023-10-12 DIAGNOSIS — I213 ST elevation (STEMI) myocardial infarction of unspecified site: Secondary | ICD-10-CM | POA: Diagnosis not present

## 2023-10-12 DIAGNOSIS — I4892 Unspecified atrial flutter: Secondary | ICD-10-CM | POA: Diagnosis not present

## 2023-10-12 DIAGNOSIS — I5022 Chronic systolic (congestive) heart failure: Secondary | ICD-10-CM | POA: Diagnosis not present

## 2023-10-15 DIAGNOSIS — E1165 Type 2 diabetes mellitus with hyperglycemia: Secondary | ICD-10-CM | POA: Diagnosis not present

## 2023-10-15 DIAGNOSIS — E6609 Other obesity due to excess calories: Secondary | ICD-10-CM | POA: Diagnosis not present

## 2023-10-15 DIAGNOSIS — I110 Hypertensive heart disease with heart failure: Secondary | ICD-10-CM | POA: Diagnosis not present

## 2023-10-15 DIAGNOSIS — Z6831 Body mass index (BMI) 31.0-31.9, adult: Secondary | ICD-10-CM | POA: Diagnosis not present

## 2023-10-15 DIAGNOSIS — R5383 Other fatigue: Secondary | ICD-10-CM | POA: Diagnosis not present

## 2023-10-15 DIAGNOSIS — R400 Somnolence: Secondary | ICD-10-CM | POA: Diagnosis not present

## 2023-10-21 DIAGNOSIS — I5022 Chronic systolic (congestive) heart failure: Secondary | ICD-10-CM | POA: Diagnosis not present

## 2023-10-21 DIAGNOSIS — R269 Unspecified abnormalities of gait and mobility: Secondary | ICD-10-CM | POA: Diagnosis not present

## 2023-10-21 DIAGNOSIS — I10 Essential (primary) hypertension: Secondary | ICD-10-CM | POA: Diagnosis not present

## 2023-10-21 DIAGNOSIS — R4182 Altered mental status, unspecified: Secondary | ICD-10-CM | POA: Diagnosis not present

## 2023-10-21 DIAGNOSIS — Z0001 Encounter for general adult medical examination with abnormal findings: Secondary | ICD-10-CM | POA: Diagnosis not present

## 2023-10-21 DIAGNOSIS — E1159 Type 2 diabetes mellitus with other circulatory complications: Secondary | ICD-10-CM | POA: Diagnosis not present

## 2023-10-21 DIAGNOSIS — R400 Somnolence: Secondary | ICD-10-CM | POA: Diagnosis not present

## 2023-10-21 DIAGNOSIS — R5383 Other fatigue: Secondary | ICD-10-CM | POA: Diagnosis not present

## 2023-10-21 DIAGNOSIS — J449 Chronic obstructive pulmonary disease, unspecified: Secondary | ICD-10-CM | POA: Diagnosis not present

## 2023-10-21 DIAGNOSIS — G4733 Obstructive sleep apnea (adult) (pediatric): Secondary | ICD-10-CM | POA: Diagnosis not present

## 2023-10-22 DIAGNOSIS — D518 Other vitamin B12 deficiency anemias: Secondary | ICD-10-CM | POA: Diagnosis not present

## 2023-10-23 ENCOUNTER — Inpatient Hospital Stay (HOSPITAL_COMMUNITY)
Admission: EM | Admit: 2023-10-23 | Discharge: 2023-10-26 | DRG: 189 | Disposition: A | Discharge status: Home / Self Care | Attending: Family Medicine | Admitting: Family Medicine

## 2023-10-23 ENCOUNTER — Other Ambulatory Visit: Payer: Self-pay

## 2023-10-23 ENCOUNTER — Emergency Department (HOSPITAL_COMMUNITY)

## 2023-10-23 ENCOUNTER — Encounter (HOSPITAL_COMMUNITY): Payer: Self-pay | Admitting: Emergency Medicine

## 2023-10-23 DIAGNOSIS — E119 Type 2 diabetes mellitus without complications: Secondary | ICD-10-CM

## 2023-10-23 DIAGNOSIS — I252 Old myocardial infarction: Secondary | ICD-10-CM | POA: Diagnosis not present

## 2023-10-23 DIAGNOSIS — N400 Enlarged prostate without lower urinary tract symptoms: Secondary | ICD-10-CM | POA: Diagnosis present

## 2023-10-23 DIAGNOSIS — F17210 Nicotine dependence, cigarettes, uncomplicated: Secondary | ICD-10-CM | POA: Diagnosis present

## 2023-10-23 DIAGNOSIS — Z833 Family history of diabetes mellitus: Secondary | ICD-10-CM

## 2023-10-23 DIAGNOSIS — E7800 Pure hypercholesterolemia, unspecified: Secondary | ICD-10-CM | POA: Diagnosis present

## 2023-10-23 DIAGNOSIS — Z9079 Acquired absence of other genital organ(s): Secondary | ICD-10-CM

## 2023-10-23 DIAGNOSIS — Z7902 Long term (current) use of antithrombotics/antiplatelets: Secondary | ICD-10-CM

## 2023-10-23 DIAGNOSIS — Z6832 Body mass index (BMI) 32.0-32.9, adult: Secondary | ICD-10-CM | POA: Diagnosis not present

## 2023-10-23 DIAGNOSIS — Z8249 Family history of ischemic heart disease and other diseases of the circulatory system: Secondary | ICD-10-CM

## 2023-10-23 DIAGNOSIS — Z9981 Dependence on supplemental oxygen: Secondary | ICD-10-CM

## 2023-10-23 DIAGNOSIS — I10 Essential (primary) hypertension: Secondary | ICD-10-CM | POA: Diagnosis present

## 2023-10-23 DIAGNOSIS — F0390 Unspecified dementia without behavioral disturbance: Secondary | ICD-10-CM | POA: Diagnosis present

## 2023-10-23 DIAGNOSIS — M170 Bilateral primary osteoarthritis of knee: Secondary | ICD-10-CM | POA: Diagnosis present

## 2023-10-23 DIAGNOSIS — E1165 Type 2 diabetes mellitus with hyperglycemia: Secondary | ICD-10-CM | POA: Diagnosis not present

## 2023-10-23 DIAGNOSIS — G8929 Other chronic pain: Secondary | ICD-10-CM | POA: Diagnosis present

## 2023-10-23 DIAGNOSIS — M25561 Pain in right knee: Secondary | ICD-10-CM | POA: Diagnosis present

## 2023-10-23 DIAGNOSIS — E662 Morbid (severe) obesity with alveolar hypoventilation: Secondary | ICD-10-CM | POA: Diagnosis present

## 2023-10-23 DIAGNOSIS — Z87442 Personal history of urinary calculi: Secondary | ICD-10-CM

## 2023-10-23 DIAGNOSIS — E876 Hypokalemia: Secondary | ICD-10-CM | POA: Diagnosis present

## 2023-10-23 DIAGNOSIS — Z7901 Long term (current) use of anticoagulants: Secondary | ICD-10-CM | POA: Diagnosis not present

## 2023-10-23 DIAGNOSIS — I700 Atherosclerosis of aorta: Secondary | ICD-10-CM | POA: Diagnosis not present

## 2023-10-23 DIAGNOSIS — I5032 Chronic diastolic (congestive) heart failure: Secondary | ICD-10-CM | POA: Diagnosis not present

## 2023-10-23 DIAGNOSIS — J9621 Acute and chronic respiratory failure with hypoxia: Principal | ICD-10-CM | POA: Diagnosis present

## 2023-10-23 DIAGNOSIS — I714 Abdominal aortic aneurysm, without rupture, unspecified: Secondary | ICD-10-CM | POA: Diagnosis present

## 2023-10-23 DIAGNOSIS — R531 Weakness: Secondary | ICD-10-CM | POA: Diagnosis not present

## 2023-10-23 DIAGNOSIS — Z85118 Personal history of other malignant neoplasm of bronchus and lung: Secondary | ICD-10-CM

## 2023-10-23 DIAGNOSIS — J439 Emphysema, unspecified: Secondary | ICD-10-CM | POA: Diagnosis present

## 2023-10-23 DIAGNOSIS — J441 Chronic obstructive pulmonary disease with (acute) exacerbation: Secondary | ICD-10-CM | POA: Diagnosis not present

## 2023-10-23 DIAGNOSIS — Z79899 Other long term (current) drug therapy: Secondary | ICD-10-CM

## 2023-10-23 DIAGNOSIS — I4892 Unspecified atrial flutter: Secondary | ICD-10-CM | POA: Diagnosis present

## 2023-10-23 DIAGNOSIS — Z888 Allergy status to other drugs, medicaments and biological substances status: Secondary | ICD-10-CM

## 2023-10-23 DIAGNOSIS — H9190 Unspecified hearing loss, unspecified ear: Secondary | ICD-10-CM | POA: Diagnosis present

## 2023-10-23 DIAGNOSIS — Z1152 Encounter for screening for COVID-19: Secondary | ICD-10-CM

## 2023-10-23 DIAGNOSIS — Z7984 Long term (current) use of oral hypoglycemic drugs: Secondary | ICD-10-CM | POA: Diagnosis not present

## 2023-10-23 DIAGNOSIS — I110 Hypertensive heart disease with heart failure: Secondary | ICD-10-CM | POA: Diagnosis not present

## 2023-10-23 DIAGNOSIS — R0602 Shortness of breath: Secondary | ICD-10-CM | POA: Diagnosis not present

## 2023-10-23 DIAGNOSIS — Z825 Family history of asthma and other chronic lower respiratory diseases: Secondary | ICD-10-CM

## 2023-10-23 DIAGNOSIS — Z860100 Personal history of colon polyps, unspecified: Secondary | ICD-10-CM

## 2023-10-23 DIAGNOSIS — M25562 Pain in left knee: Secondary | ICD-10-CM | POA: Diagnosis present

## 2023-10-23 DIAGNOSIS — J9601 Acute respiratory failure with hypoxia: Secondary | ICD-10-CM | POA: Diagnosis not present

## 2023-10-23 DIAGNOSIS — Z955 Presence of coronary angioplasty implant and graft: Secondary | ICD-10-CM

## 2023-10-23 DIAGNOSIS — M109 Gout, unspecified: Secondary | ICD-10-CM | POA: Diagnosis present

## 2023-10-23 DIAGNOSIS — I2510 Atherosclerotic heart disease of native coronary artery without angina pectoris: Secondary | ICD-10-CM | POA: Diagnosis present

## 2023-10-23 DIAGNOSIS — E66811 Obesity, class 1: Secondary | ICD-10-CM | POA: Diagnosis present

## 2023-10-23 DIAGNOSIS — Z716 Tobacco abuse counseling: Secondary | ICD-10-CM

## 2023-10-23 DIAGNOSIS — Z810 Family history of intellectual disabilities: Secondary | ICD-10-CM

## 2023-10-23 DIAGNOSIS — Z807 Family history of other malignant neoplasms of lymphoid, hematopoietic and related tissues: Secondary | ICD-10-CM

## 2023-10-23 DIAGNOSIS — R059 Cough, unspecified: Secondary | ICD-10-CM | POA: Diagnosis not present

## 2023-10-23 DIAGNOSIS — J432 Centrilobular emphysema: Secondary | ICD-10-CM | POA: Diagnosis not present

## 2023-10-23 DIAGNOSIS — T380X5A Adverse effect of glucocorticoids and synthetic analogues, initial encounter: Secondary | ICD-10-CM | POA: Diagnosis not present

## 2023-10-23 LAB — TROPONIN I (HIGH SENSITIVITY)
Troponin I (High Sensitivity): 24 ng/L — ABNORMAL HIGH (ref <18)
Troponin I (High Sensitivity): 25 ng/L — ABNORMAL HIGH (ref <18)

## 2023-10-23 LAB — BASIC METABOLIC PANEL WITH GFR
Anion gap: 10 (ref 5–15)
BUN: 19 mg/dL (ref 8–23)
CO2: 26 mmol/L (ref 22–32)
Calcium: 9.3 mg/dL (ref 8.9–10.3)
Chloride: 102 mmol/L (ref 98–111)
Creatinine, Ser: 1.06 mg/dL (ref 0.61–1.24)
GFR, Estimated: >60 mL/min (ref >60)
Glucose, Bld: 156 mg/dL — ABNORMAL HIGH (ref 70–99)
Potassium: 3.2 mmol/L — ABNORMAL LOW (ref 3.5–5.1)
Sodium: 138 mmol/L (ref 135–145)

## 2023-10-23 LAB — CBC
HCT: 44.1 % (ref 39.0–52.0)
Hemoglobin: 14.8 g/dL (ref 13.0–17.0)
MCH: 31.8 pg (ref 26.0–34.0)
MCHC: 33.6 g/dL (ref 30.0–36.0)
MCV: 94.8 fL (ref 80.0–100.0)
Platelets: 158 10*3/uL (ref 150–400)
RBC: 4.65 MIL/uL (ref 4.22–5.81)
RDW: 16.2 % — ABNORMAL HIGH (ref 11.5–15.5)
WBC: 13.9 10*3/uL — ABNORMAL HIGH (ref 4.0–10.5)
nRBC: 0 % (ref 0.0–0.2)

## 2023-10-23 LAB — GLUCOSE, CAPILLARY
Glucose-Capillary: 257 mg/dL — ABNORMAL HIGH (ref 70–99)
Glucose-Capillary: 308 mg/dL — ABNORMAL HIGH (ref 70–99)

## 2023-10-23 LAB — BRAIN NATRIURETIC PEPTIDE: B Natriuretic Peptide: 362 pg/mL — ABNORMAL HIGH (ref 0.0–100.0)

## 2023-10-23 MED ORDER — IPRATROPIUM-ALBUTEROL 0.5-2.5 (3) MG/3ML IN SOLN
3.0000 mL | Freq: Four times a day (QID) | RESPIRATORY_TRACT | Status: DC
  Administered 2023-10-23 – 2023-10-24 (×5): 3 mL via RESPIRATORY_TRACT
  Filled 2023-10-23 (×5): qty 3

## 2023-10-23 MED ORDER — CLOPIDOGREL BISULFATE 75 MG PO TABS
75.0000 mg | ORAL_TABLET | Freq: Every day | ORAL | Status: DC
  Administered 2023-10-24 – 2023-10-26 (×3): 75 mg via ORAL
  Filled 2023-10-23 (×3): qty 1

## 2023-10-23 MED ORDER — INSULIN ASPART 100 UNIT/ML IJ SOLN
0.0000 [IU] | Freq: Every day | INTRAMUSCULAR | Status: DC
  Administered 2023-10-23: 4 [IU] via SUBCUTANEOUS
  Administered 2023-10-24 – 2023-10-25 (×2): 2 [IU] via SUBCUTANEOUS

## 2023-10-23 MED ORDER — SODIUM CHLORIDE 0.9 % IV SOLN
1.0000 g | Freq: Once | INTRAVENOUS | Status: AC
  Administered 2023-10-23: 1 g via INTRAVENOUS
  Filled 2023-10-23: qty 10

## 2023-10-23 MED ORDER — POTASSIUM CHLORIDE CRYS ER 20 MEQ PO TBCR
40.0000 meq | EXTENDED_RELEASE_TABLET | Freq: Two times a day (BID) | ORAL | Status: DC
  Administered 2023-10-23 – 2023-10-26 (×6): 40 meq via ORAL
  Filled 2023-10-23 (×6): qty 2

## 2023-10-23 MED ORDER — SODIUM CHLORIDE 0.9 % IV SOLN
1.0000 g | INTRAVENOUS | Status: DC
  Administered 2023-10-24: 1 g via INTRAVENOUS
  Filled 2023-10-23: qty 10

## 2023-10-23 MED ORDER — GUAIFENESIN ER 600 MG PO TB12
600.0000 mg | ORAL_TABLET | Freq: Two times a day (BID) | ORAL | Status: DC
  Administered 2023-10-23 – 2023-10-24 (×2): 600 mg via ORAL
  Filled 2023-10-23 (×2): qty 1

## 2023-10-23 MED ORDER — ONDANSETRON HCL 4 MG/2ML IJ SOLN
4.0000 mg | Freq: Four times a day (QID) | INTRAMUSCULAR | Status: DC | PRN
Start: 2023-10-23 — End: 2023-10-26

## 2023-10-23 MED ORDER — IPRATROPIUM-ALBUTEROL 0.5-2.5 (3) MG/3ML IN SOLN
3.0000 mL | RESPIRATORY_TRACT | Status: AC
  Administered 2023-10-23 (×3): 3 mL via RESPIRATORY_TRACT
  Filled 2023-10-23: qty 9

## 2023-10-23 MED ORDER — ATORVASTATIN CALCIUM 40 MG PO TABS
80.0000 mg | ORAL_TABLET | Freq: Every day | ORAL | Status: DC
  Administered 2023-10-23 – 2023-10-26 (×4): 80 mg via ORAL
  Filled 2023-10-23 (×4): qty 2

## 2023-10-23 MED ORDER — PREDNISONE 20 MG PO TABS: 40.0000 mg | ORAL_TABLET | Freq: Every day | ORAL | Status: DC

## 2023-10-23 MED ORDER — SERTRALINE HCL 50 MG PO TABS
50.0000 mg | ORAL_TABLET | Freq: Every day | ORAL | Status: DC
  Administered 2023-10-24 – 2023-10-25 (×2): 50 mg via ORAL
  Filled 2023-10-23 (×3): qty 1

## 2023-10-23 MED ORDER — NICOTINE 21 MG/24HR TD PT24: 21.0000 mg | MEDICATED_PATCH | Freq: Every day | TRANSDERMAL | Status: DC | PRN

## 2023-10-23 MED ORDER — METFORMIN HCL 500 MG PO TABS
1000.0000 mg | ORAL_TABLET | Freq: Two times a day (BID) | ORAL | Status: DC
  Administered 2023-10-24: 1000 mg via ORAL
  Filled 2023-10-23: qty 2

## 2023-10-23 MED ORDER — BUDESON-GLYCOPYRROL-FORMOTEROL 160-9-4.8 MCG/ACT IN AERO
2.0000 | INHALATION_SPRAY | Freq: Two times a day (BID) | RESPIRATORY_TRACT | Status: DC
  Administered 2023-10-24 – 2023-10-26 (×5): 2 via RESPIRATORY_TRACT
  Filled 2023-10-23 (×2): qty 5.9

## 2023-10-23 MED ORDER — METHYLPREDNISOLONE SODIUM SUCC 125 MG IJ SOLR
125.0000 mg | Freq: Once | INTRAMUSCULAR | Status: AC
  Administered 2023-10-23: 125 mg via INTRAVENOUS
  Filled 2023-10-23: qty 2

## 2023-10-23 MED ORDER — APIXABAN 5 MG PO TABS
5.0000 mg | ORAL_TABLET | Freq: Two times a day (BID) | ORAL | Status: DC
  Administered 2023-10-23 – 2023-10-26 (×6): 5 mg via ORAL
  Filled 2023-10-23 (×6): qty 1

## 2023-10-23 MED ORDER — TORSEMIDE 20 MG PO TABS
20.0000 mg | ORAL_TABLET | Freq: Every day | ORAL | Status: DC
  Administered 2023-10-24 – 2023-10-26 (×3): 20 mg via ORAL
  Filled 2023-10-23 (×3): qty 1

## 2023-10-23 MED ORDER — INSULIN ASPART 100 UNIT/ML IJ SOLN
0.0000 [IU] | Freq: Three times a day (TID) | INTRAMUSCULAR | Status: DC
  Administered 2023-10-24: 11 [IU] via SUBCUTANEOUS
  Administered 2023-10-24: 3 [IU] via SUBCUTANEOUS
  Administered 2023-10-24: 11 [IU] via SUBCUTANEOUS
  Administered 2023-10-25: 4 [IU] via SUBCUTANEOUS
  Administered 2023-10-25: 15 [IU] via SUBCUTANEOUS
  Administered 2023-10-25 – 2023-10-26 (×2): 7 [IU] via SUBCUTANEOUS

## 2023-10-23 MED ORDER — ALBUTEROL SULFATE (2.5 MG/3ML) 0.083% IN NEBU: 2.5000 mg | INHALATION_SOLUTION | RESPIRATORY_TRACT | Status: DC | PRN

## 2023-10-23 MED ORDER — METHYLPREDNISOLONE SODIUM SUCC 125 MG IJ SOLR
60.0000 mg | Freq: Two times a day (BID) | INTRAMUSCULAR | Status: AC
  Administered 2023-10-23 – 2023-10-24 (×2): 60 mg via INTRAVENOUS
  Filled 2023-10-23 (×2): qty 2

## 2023-10-23 MED ORDER — ONDANSETRON HCL 4 MG PO TABS: 4.0000 mg | ORAL_TABLET | Freq: Four times a day (QID) | ORAL | Status: DC | PRN

## 2023-10-23 MED ORDER — LOSARTAN POTASSIUM 25 MG PO TABS
25.0000 mg | ORAL_TABLET | Freq: Every day | ORAL | Status: DC
  Administered 2023-10-24 – 2023-10-25 (×2): 25 mg via ORAL
  Filled 2023-10-23 (×3): qty 1

## 2023-10-23 MED ORDER — TAMSULOSIN HCL 0.4 MG PO CAPS
0.4000 mg | ORAL_CAPSULE | Freq: Every day | ORAL | Status: DC
  Administered 2023-10-23 – 2023-10-25 (×3): 0.4 mg via ORAL
  Filled 2023-10-23 (×3): qty 1

## 2023-10-23 MED ORDER — MEMANTINE HCL 10 MG PO TABS
5.0000 mg | ORAL_TABLET | Freq: Two times a day (BID) | ORAL | Status: DC
  Administered 2023-10-24 – 2023-10-26 (×5): 5 mg via ORAL
  Filled 2023-10-23 (×5): qty 1

## 2023-10-23 NOTE — Progress Notes (Signed)
   10/23/23 2154  TOC Brief Assessment  Insurance and Status Reviewed  Patient has primary care physician Yes  Home environment has been reviewed From home  Prior level of function: Independent  Prior/Current Home Services Current home services (oxygen )  Social Drivers of Health Review SDOH reviewed interventions complete (smoking cessaton added to AVS)  Readmission risk has been reviewed Yes  Transition of care needs no transition of care needs at this time   Transition of Care Department [facility]) has reviewed patient and will continue to monitor.

## 2023-10-23 NOTE — ED Provider Notes (Signed)
 [place] [facility]MENT AT [facility] Provider Note   CSN: [id number] Arrival date & time: 10/23/23  1225     History  Chief Complaint  Patient presents with   Shortness of Breath    Kevin Frank is a 83 y.o. male with PMH as listed below who presents with sob, weakness and cough. Recent STEMI s/p DES in feb 2025. Wife and son at bedside provides history. Patient denies SOB, weakness now but endorses cough. He is supposed to wear O2 w/ CPAP at night but doesn't normally wear. Isn't supposed to wear O2 during the day. Son states that he smoked two cigarettes on the way up to the ER today. Denies f/c, pain, CP, N/V/D, or increase in leg swelling. Currently satting 95% on 3L [place]. Very Hard of hearing.   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 Authorizing Provider  albuterol  (PROVENTIL ) (2.5 MG/3ML) 0.083% nebulizer solution Take 3 mLs (2.5 mg total) by nebulization every 6 (six) hours as needed for wheezing or shortness of breath. 09/30/23   Rochester Chuck, MD  albuterol  (VENTOLIN  HFA) [phone] Base) MCG/ACT inhaler Use 4 puffs every 4-6 hours as needed for cough or wheeze 09/30/23   Rochester Chuck, MD  allopurinol  (ZYLOPRIM ) 300 MG tablet  Take 1 tablet (300 mg total) by mouth daily. 09/07/19   Corum, Roslynn Coombes, MD  apixaban  (ELIQUIS ) 5 MG TABS tablet Take 1 tablet (5 mg total) by mouth 2 (two) times daily. 09/28/23   Lasalle Pointer, NP  atorvastatin  (LIPITOR) 80 MG tablet Take 1 tablet (80 mg total) by mouth daily. 09/28/23   Lasalle Pointer, NP  ciprofloxacin  (CIPRO ) 250 MG tablet Take 1 tablet (250 mg total) by mouth 2 (two) times daily. 08/11/23   Duke, Warren Haber, PA  clopidogrel  (PLAVIX ) 75 MG tablet Take 1 tablet (75 mg total) by mouth daily. 09/28/23   Lasalle Pointer, NP  Cobalamin Combinations (NEURIVA PLUS) CAPS Take 1 tablet by mouth daily.    [provider]  EPINEPHrine  0.3 mg/0.3 mL IJ SOAJ injection Inject 0.3 mg into the muscle as needed for anaphylaxis. 01/09/23   Rochester Chuck, MD  famotidine  (PEPCID ) 20 MG tablet Take 1 tablet (20 mg total) by mouth 2 (two) times daily as needed for heartburn (rash). 08/11/23   Duke, Warren Haber, PA  Fluticasone -Umeclidin-Vilant (TRELEGY ELLIPTA ) 100-62.5-25 MCG/ACT AEPB Inhale 1 puff into the lungs daily. 09/30/23   Rochester Chuck, MD  loratadine  (CLARITIN ) 10 MG tablet Take 1 tablet (10 mg total) by mouth  daily as needed for allergies (rash). 08/11/23   Duke, Warren Haber, PA  losartan  (COZAAR ) 25 MG tablet Take 1 tablet (25 mg total) by mouth daily. 09/28/23   Lasalle Pointer, NP  metFORMIN  (GLUCOPHAGE ) 500 MG tablet Take 1,000 mg by mouth 2 (two) times daily. 01/29/23   [provider]  metoprolol  succinate (TOPROL -XL) 25 MG 24 hr tablet Take 25 mg by mouth daily. 10/23/23   [provider]  metoprolol  succinate (TOPROL -XL) 50 MG 24 hr tablet Take 1 tablet (50 mg total) by mouth 2 (two) times daily. Take with or immediately following a meal. 09/28/23   Lasalle Pointer, NP  nicotine  (NICODERM CQ  - DOSED IN MG/24 HOURS) 21 mg/24hr patch Place 1 patch (21 mg total) onto the skin daily. 08/12/23   Duke, Warren Haber, PA  nitroGLYCERIN  (NITROSTAT ) 0.4 MG SL  tablet Place 1 tablet (0.4 mg total) under the tongue every 5 (five) minutes as needed for chest pain. 08/11/23 08/10/24  Lamond Pilot, PA  sertraline  (ZOLOFT ) 50 MG tablet Take 1 tablet (50 mg total) by mouth daily. 08/29/19   Corum, Roslynn Coombes, MD  tamsulosin  (FLOMAX ) 0.4 MG CAPS capsule Take 1 capsule (0.4 mg total) by mouth daily after supper. 08/11/23   Duke, Warren Haber, PA  torsemide  (DEMADEX ) 20 MG tablet Take 1 tablet (20 mg total) by mouth daily. 09/28/23   Lasalle Pointer, NP      Allergies    Amiodarone     Review of Systems   Review of Systems A 10 point review of systems was performed and is negative unless otherwise reported in HPI.  Physical Exam Updated Vital Signs BP (!) 160/96 (BP Location: Right Arm)   Pulse 75   Temp 98.5 F (36.9 C) (Oral)   Resp 19   Ht 5\' 10"  (1.778 m)   Wt 101.6 kg   SpO2 95%   BMI 32.14 kg/m  Physical Exam General: Elderly obese male, lying in bed.  HEENT: PERRLA, Sclera anicteric, MMM, trachea midline.  Cardiology: RRR, no murmurs/rubs/gallops. BL radial and DP pulses equal bilaterally.  Resp: Decreased breath sounds bilaterally with expiratory wheezing. No significant resp distress.  Abd: Soft, non-tender, non-distended. No rebound tenderness or guarding.  GU: Deferred. MSK: 2+ pitting peripheral edema in BL LEs. No signs of trauma. Extremities without deformity or TTP. No cyanosis or clubbing. Skin: warm, dry.  Neuro: A&Ox4, CNs II-XII grossly intact. MAEs. Sensation grossly intact.  Psych: Normal mood and affect.   ED Results / Procedures / Treatments   Labs (all labs ordered are listed, but only abnormal results are displayed) Labs Reviewed  BASIC METABOLIC PANEL WITH GFR - Abnormal; Notable for the following components:      Result Value   Potassium 3.2 (*)    Glucose, Bld 156 (*)    All other components within normal limits  CBC - Abnormal; Notable for the following components:   WBC 13.9 (*)    RDW 16.2 (*)    All other  components within normal limits  BRAIN NATRIURETIC PEPTIDE - Abnormal; Notable for the following components:   B Natriuretic Peptide 362.0 (*)    All other components within normal limits  TROPONIN I (HIGH SENSITIVITY) - Abnormal; Notable for the following components:   Troponin I (High Sensitivity) 24 (*)    All other components within normal limits  RESP PANEL BY RT-PCR (RSV, FLU A&B, COVID)  [id number]  TROPONIN I (HIGH SENSITIVITY)    EKG EKG Interpretation Date/Time:  Friday  Oct 23 2023 13:27:32 EDT Ventricular Rate:  75 PR Interval:  167 QRS Duration:  109 QT Interval:  364 QTC Calculation: 407 R Axis:   -75  Text Interpretation: Sinus rhythm Consider RVH w/ secondary repol abnormality Inferior infarct, age indeterminate Confirmed by Annita Kindle [phone]) on 10/23/2023 1:40:05 PM  Radiology DG Chest Port 1 View Result Date: 10/23/2023 CLINICAL DATA:  Shortness of breath, weakness and cough EXAM: PORTABLE CHEST 1 VIEW COMPARISON:  08/07/2023 FINDINGS: Atherosclerotic calcification of the aortic arch. Mild enlargement of the cardiopericardial silhouette Apical lordotic projection.  Centrilobular emphysema. Mild interstitial accentuation, as can commonly be encountered in smokers. The lungs appear otherwise clear. IMPRESSION: 1. Mild enlargement of the cardiopericardial silhouette. 2. Aortic Atherosclerosis (ICD10-I70.0) and Emphysema (ICD10-J43.9). Electronically Signed   By: Freida Jes M.D.   On: 10/23/2023 14:10    Procedures Procedures    Medications Ordered in ED Medications  cefTRIAXone  (ROCEPHIN ) 1 g in sodium chloride  0.9 % 100 mL IVPB (has no administration in time range)  ipratropium-albuterol  (DUONEB) 0.5-2.5 (3) MG/3ML nebulizer solution 3 mL (3 mLs Nebulization Given 10/23/23 1356)  methylPREDNISolone  sodium succinate (SOLU-MEDROL ) 125 mg/2 mL injection 125 mg (125 mg Intravenous Given 10/23/23 1446)    ED Course/ Medical Decision Making/ A&P                           Medical Decision Making Amount and/or Complexity of Data Reviewed Labs: ordered. Decision-making details documented in ED Course. Radiology: ordered. Decision-making details documented in ED Course.  Risk Prescription drug management. Decision regarding hospitalization.    This patient presents to the ED for concern of SOB, increased cough, increased weakness; this involves an extensive number of treatment options, and is a complaint that carries with it a high risk of complications and morbidity.  I considered the following differential and admission for this acute, potentially life threatening condition.   MDM:    DDX for dyspnea includes but is not limited to:  Patient with expiratory wheezing, increased cough, increased shortness of breath with history of severe COPD still smoking likely with COPD exacerbation.  He has no fever or focal consolidation on chest x-ray but rather mild enlargement of cardiac silhouette.  Does have swelling in bilateral lower extremities and BNP is mildly elevated at 362.0, takes torsemide  at home, no distinct pulmonary edema noted on chest x-ray.  For COPD exacerbation gave Solu-Medrol  and 3 DuoNeb's in a row which improved patient's some but he is still satting 95% on 4 L nasal cannula.  Does not wear oxygen  24/7 at home, only supposed to wear it at night.  Discussed with patient and his wife who agree with admission.    Patient does have history of STEMI in February and first troponin is reported at 57 but lab called the bedside nurse to tell her that it was mislabeled and that is not actually his troponin result.  Clinical Course as of 10/23/23 1610  Fri Oct 23, 2023  1431 WBC(!): 13.9 +leukocytosis [HN]  1431 Potassium(!): 3.2 Mild hypokalemia [HN]  1431 DG Chest Port 1 View 1. Mild enlargement of the cardiopericardial silhouette. 2. Aortic Atherosclerosis (ICD10-I70.0) and Emphysema (ICD10-J43.9).   [HN]  1518 Troponin I (High  Sensitivity)(!): 64 Mislabeled bloodwork per lab [HN]    Clinical Course User Index [HN] Merdis Stalling, MD    Labs: I Ordered, and personally interpreted labs.  The pertinent results include:  those listed above  Imaging  Studies ordered: I ordered imaging studies including CXR I independently visualized and interpreted imaging. I agree with the radiologist interpretation  Additional history obtained from chart review, family at bedside  Cardiac Monitoring: The patient was maintained on a cardiac monitor.  I personally viewed and interpreted the cardiac monitored which showed an underlying rhythm of: NSR  Reevaluation: After the interventions noted above, I reevaluated the patient and found that they have :stayed the same  Social Determinants of Health: Lives independently. Smokes tobacco.  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    Normal nuclear stress test 2011   Obesity    Obstructive sleep apnea on CPAP    Presence of indwelling urinary catheter    Shortness of breath    due to cigarette abuse   Tobacco abuse      Medicines Meds ordered this encounter  Medications   ipratropium-albuterol  (DUONEB) 0.5-2.5 (3) MG/3ML nebulizer solution 3 mL   methylPREDNISolone  sodium succinate (SOLU-MEDROL ) 125 mg/2 mL injection 125 mg   cefTRIAXone  (ROCEPHIN ) 1 g in sodium chloride  0.9 % 100 mL IVPB    Antibiotic Indication::   CAP    I have reviewed the patients  home medicines and have made adjustments as needed  Problem List / ED Course: Problem List Items Addressed This Visit   None Visit Diagnoses       Acute on chronic hypoxic respiratory failure (HCC)    -  Primary     COPD exacerbation (HCC)       Relevant Medications   ipratropium-albuterol  (DUONEB) 0.5-2.5 (3) MG/3ML nebulizer solution 3 mL (Completed)   methylPREDNISolone  sodium succinate (SOLU-MEDROL ) 125 mg/2 mL injection 125 mg (Completed)                   This note was created using dictation software, which may contain spelling or grammatical errors.    Merdis Stalling, MD 10/23/23 [id number]

## 2023-10-23 NOTE — ED Triage Notes (Signed)
 Pt is supposed to wear 5 L of oxygen  but per family, pt will not stop smoking long enough to wear the oxygen .  Pt c/o of sob, weakness and cough.

## 2023-10-23 NOTE — H&P (Signed)
 History and Physical    Patient: Kevin Frank ZOX:[id number] DOB: 05-18-1941 DOA: 10/23/2023 DOS: the patient was seen and examined on 10/23/2023 PCP: [name], MD  Patient coming from: Home  Chief Complaint:  Chief Complaint  Patient presents with   Shortness of Breath   HPI: Kevin Frank is a 83 y.o. male with medical history significant of COPD with continued tobacco abuse, type 2 diabetes, BPH, hypertension, obstructive sleep apnea, AAA.  Patient was noted to be hypoxic yesterday and this morning with pulse ox in the 80s.  The patient has been coughing.  No fevers, chills, nausea, vomiting.  Patient was brought here to the hospital for evaluation due to the continued hypoxia.  Of note, the patient has been having difficulty with his memory.  He does not have a formal diagnosis of dementia, although his PCP started him on memantine.  Review of Systems: As mentioned in the history of present illness. All other systems reviewed and are negative. 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    Normal nuclear stress test 2011   Obesity    Obstructive sleep apnea on CPAP    Presence of indwelling urinary catheter    Shortness of breath    due to cigarette abuse   Tobacco abuse    Past Surgical History:  Procedure Laterality Date   ABDOMINAL AORTIC ENDOVASCULAR FENESTRATED STENT GRAFT N/A 04/09/2016   Procedure: ABDOMINAL AORTIC ENDOVASCULAR FENESTRATED STENT GRAFT with left brachial artery access using ultrasound guidance;  Surgeon: Margherita Shell,  MD;  Location: [facility] OR;  Service: Vascular;  Laterality: N/A;   CHOLECYSTECTOMY  1997   [facility]   COLONOSCOPY  2006   2006: multiple diminutive polyps in rectum, one pedunculated polyp at 10 cm s/p snare removal. Left-sided diverticulum, pedunculated polyps at 35 cm and splenic flexure s/p snare removal. No path available.    COLONOSCOPY WITH PROPOFOL  N/A 11/24/2019   Procedure: COLONOSCOPY WITH PROPOFOL ;  Surgeon: Suzette Espy, MD;  Location: [facility] ENDO SUITE;  Service: Endoscopy;  Laterality: N/A;  9:45am   CORONARY/GRAFT ACUTE MI REVASCULARIZATION N/A 08/04/2023   Procedure: Coronary/Graft Acute MI Revascularization;  Surgeon: Kyra Phy, MD;  Location: [facility] INVASIVE CV LAB;  Service: Cardiovascular;  Laterality: N/A;   PERIPHERAL VASCULAR CATHETERIZATION N/A 04/10/2016   Procedure: Renal Angiography;  Surgeon: Dannis Dy, MD;  Location[facility] INVAS[facility] CV LAB;  Service: Cardiovascular;  Laterality: N/A;   POLYPECTOMY  11/24/2019   Procedure: POLYPECTOMY;  Surgeon: Suzette Espy, MD;  Location: [facility] ENDO SUITE;  Service: Endoscopy;;   TRANSURETHRAL RESECTION OF PROSTATE  01/07/2011   Procedure: TRANSURETHRAL RESECTION OF THE PROSTATE (TURP);  Surgeon: Mohammad I Javaid;  Location: [facility] ORS;  Service: Urology;  Laterality: N/A;   Social History:  reports that he has been smoking cigarettes. He has a 100 pack-year smoking history. He has never been exposed to tobacco smoke. He has never used  smokeless tobacco. He reports current alcohol use. He reports that he does not use drugs.  Allergies  Allergen Reactions   Amiodarone  Hives and Rash    Family History  Problem Relation Age of Onset   Liver disease Father    Lymphoma Mother    Diabetes Mother    Hypertension Mother    Cancer Mother    Mental retardation Other        sibling -- from birth    Asthma Daughter    Allergic rhinitis Neg Hx    Angioedema Neg Hx    Atopy Neg Hx    Eczema Neg Hx    Immunodeficiency Neg Hx    Colon  cancer Neg Hx     Prior to Admission medications   Medication Sig Start Date End Date Taking? Authorizing Provider  memantine (NAMENDA) 5 MG tablet Take 5 mg by mouth 2 (two) times daily.   Yes [provider]  albuterol  (PROVENTIL ) (2.5 MG/3ML) 0.083% nebulizer solution Take 3 mLs (2.5 mg total) by nebulization every 6 (six) hours as needed for wheezing or shortness of breath. 09/30/23   Rochester Chuck, MD  albuterol  (VENTOLIN  HFA) 108 [phone] Base) MCG/ACT inhaler Use 4 puffs every 4-6 hours as needed for cough or wheeze 09/30/23   Rochester Chuck, MD  allopurinol  (ZYLOPRIM ) 300 MG tablet Take 1 tablet (300 mg total) by mouth daily. 09/07/19   Corum, Roslynn Coombes, MD  apixaban  (ELIQUIS ) 5 MG TABS tablet Take 1 tablet (5 mg total) by mouth 2 (two) times daily. 09/28/23   Lasalle Pointer, NP  atorvastatin  (LIPITOR) 80 MG tablet Take 1 tablet (80 mg total) by mouth daily. 09/28/23   Lasalle Pointer, NP  clopidogrel  (PLAVIX ) 75 MG tablet Take 1 tablet (75 mg total) by mouth daily. 09/28/23   Lasalle Pointer, NP  Cobalamin Combinations (NEURIVA PLUS) CAPS Take 1 tablet by mouth daily.    [provider]  EPINEPHrine  0.3 mg/0.3 mL IJ SOAJ injection Inject 0.3 mg into the muscle as needed for anaphylaxis. 01/09/23   Rochester Chuck, MD  famotidine  (PEPCID ) 20 MG tablet Take 1 tablet (20 mg total) by mouth 2 (two) times daily as needed for heartburn (rash). 08/11/23   [facility], Warren Haber, PA  Fluticasone -Umeclidin-Vilant (TRELEGY ELLIPTA ) 100-62.5-25 MCG/ACT AEPB Inhale 1 puff into the lungs daily. 09/30/23   Rochester Chuck, MD  loratadine  (CLARITIN ) 10 MG tablet Take 1 tablet (10 mg total) by mouth daily as needed for allergies (rash). 08/11/23   [facility], Warren Haber, PA  losartan  (COZAAR ) 25 MG tablet Take 1 tablet (25 mg total) by mouth daily. 09/28/23   Lasalle Pointer, NP  metFORMIN  (GLUCOPHAGE ) 500 MG tablet Take 1,000 mg by mouth 2 (two) times daily. 01/29/23   [provider]  metoprolol  succinate (TOPROL -XL) 50 MG 24 hr tablet Take 1 tablet (50 mg total) by mouth 2 (two) times daily. Take with or immediately following a meal. Patient taking differently: Take 50 mg by mouth daily. Take with or immediately following a meal. 09/28/23   Lasalle Pointer, NP  nicotine  (NICODERM CQ  - DOSED IN MG/24 HOURS) 21 mg/24hr patch Place 1 patch (21 mg total) onto the skin daily. 08/12/23   [facility], Warren Haber, PA  nitroGLYCERIN  (NITROSTAT ) 0.4 MG SL tablet Place 1 tablet (0.4 mg total) under the tongue every 5 (five) minutes as needed for chest pain. 08/11/23 08/10/24  Lamond Pilot, PA  sertraline  (ZOLOFT ) 50 MG tablet Take 1 tablet (50  mg total) by mouth daily. 08/29/19   Corum, Roslynn Coombes, MD  tamsulosin  (FLOMAX ) 0.4 MG CAPS capsule Take 1 capsule (0.4 mg total) by mouth daily after supper. 08/11/23   [facility], Warren Haber, PA  torsemide  (DEMADEX ) 20 MG tablet Take 1 tablet (20 mg total) by mouth daily. 09/28/23   Lasalle Pointer, NP    Physical Exam: Vitals:   10/23/23 1300 10/23/23 1305 10/23/23 1356 10/23/23 1734  BP:  (!) 160/96  (!) 166/74  Pulse:  75  90  Resp:  19    Temp:  98.5 F (36.9 C)  98.2 F (36.8 C)  TempSrc:  Oral  Oral  SpO2:  95% 95% 92%  Weight: 101.6 kg     Height: 5\' 10"  (1.778 m)      General: Elderly male. Awake and alert and oriented x3. No acute cardiopulmonary distress.  HEENT: Normocephalic atraumatic.  Right and left ears normal in appearance.  Pupils equal, round, reactive to light. Extraocular muscles are intact. Sclerae anicteric and noninjected.  Moist mucosal membranes. No mucosal lesions.  Neck: Neck supple without lymphadenopathy. No carotid bruits. No masses palpated.  Cardiovascular: Regular rate with normal S1-S2 sounds. No murmurs, rubs, gallops auscultated. No JVD.  Respiratory: Prolonged exhalation.  Diffuse expiratory coarse wheezing no accessory muscle use. Abdomen: Soft, nontender, nondistended. Active bowel sounds. No masses or  hepatosplenomegaly  Skin: No rashes, lesions, or ulcerations.  Dry, warm to touch. 2+ dorsalis pedis and radial pulses. Musculoskeletal: No calf or leg pain. All major joints not erythematous nontender.  No upper or lower joint deformation.  Good ROM.  No contractures  Psychiatric: Intact judgment and insight. Pleasant and cooperative. Neurologic: No focal neurological deficits. Strength is 5/5 and symmetric in upper and lower extremities.  Cranial nerves II through XII are grossly intact.  Data Reviewed: Results for orders placed or performed during the hospital encounter of 10/23/23 (from the past 24 hours)  Troponin I (High Sensitivity)     Status: Abnormal   Collection Time: 10/23/23  1:39 PM  Result Value Ref Range   Troponin I (High Sensitivity) 24 (H) <18 ng/L  Basic metabolic panel     Status: Abnormal   Collection Time: 10/23/23  1:46 PM  Result Value Ref Range   Sodium 138 135 - 145 mmol/L   Potassium 3.2 (L) 3.5 - 5.1 mmol/L   Chloride 102 98 - 111 mmol/L   CO2 26 22 - 32 mmol/L   Glucose, Bld 156 (H) 70 - 99 mg/dL   BUN 19 8 - 23 mg/dL   Creatinine, Ser 7.82 0.61 - 1.24 mg/dL   Calcium  9.3 8.9 - 10.3 mg/dL   GFR, Estimated >95 >62 mL/min   Anion gap 10 5 - 15  CBC     Status: Abnormal   Collection Time: 10/23/23  1:46 PM  Result Value Ref Range   WBC 13.9 (H) 4.0 - 10.5 K/uL   RBC 4.65 4.22 - 5.81 MIL/uL   Hemoglobin 14.8 13.0 - 17.0 g/dL   HCT 13.0 86.5 - 78.4 %   MCV 94.8 80.0 - 100.0 fL   MCH 31.8 26.0 - 34.0 pg   MCHC 33.6 30.0 - 36.0 g/dL   RDW 69.6 (H) 29.5 - 28.4 %   Platelets 158 150 - 400 K/uL   nRBC 0.0 0.0 - 0.2 %  Brain natriuretic peptide     Status: Abnormal   Collection Time: 10/23/23  1:46 PM  Result Value Ref Range   B  Natriuretic Peptide 362.0 (H) 0.0 - 100.0 pg/mL  Troponin I (High Sensitivity)     Status: Abnormal   Collection Time: 10/23/23  3:58 PM  Result Value Ref Range   Troponin I (High Sensitivity) 25 (H) <18 ng/L    DG Chest Port  1 View Result Date: 10/23/2023 CLINICAL DATA:  Shortness of breath, weakness and cough EXAM: PORTABLE CHEST 1 VIEW COMPARISON:  08/07/2023 FINDINGS: Atherosclerotic calcification of the aortic arch. Mild enlargement of the cardiopericardial silhouette Apical lordotic projection.  Centrilobular emphysema. Mild interstitial accentuation, as can commonly be encountered in smokers. The lungs appear otherwise clear. IMPRESSION: 1. Mild enlargement of the cardiopericardial silhouette. 2. Aortic Atherosclerosis (ICD10-I70.0) and Emphysema (ICD10-J43.9). Electronically Signed   By: Freida Jes M.D.   On: 10/23/2023 14:10     Assessment and Plan: No notes have been filed under this hospital service. Service: Hospitalist  Principal Problem:   Acute respiratory failure with hypoxia (HCC) Active Problems:   Essential hypertension   Chronic heart failure with preserved ejection fraction (HCC)   Diabetes mellitus without complication (HCC)   COPD with acute exacerbation (HCC)   Atrial flutter (HCC)   Hypokalemia   Cigarette smoker  Acute respiratory failure with hypoxia Oxygen  support COPD with a acute exacerbation with continued tobacco abuse Antibiotics: rochepin DuoNeb's every 6 scheduled with albuterol  every 2 when necessary Continue inhaled steroids and LA bronchodilator Solu-Medrol  60 mg IV every 12 hours Mucinex  CHF with preserved EF Cont torsemide  Potassium replacement Atrial flutter On eliquis  Hypokalemia Potassium replacement Check BMP in AM   Advance Care Planning:   Code Status: Full Code confirmed by patient  Consults: none  Family Communication: wife present  Severity of Illness: The appropriate patient status for this patient is INPATIENT. Inpatient status is judged to be reasonable and necessary in order to provide the required intensity of service to ensure the patient's safety. The patient's presenting symptoms, physical exam findings, and initial radiographic  and laboratory data in the context of their chronic comorbidities is felt to place them at high risk for further clinical deterioration. Furthermore, it is not anticipated that the patient will be medically stable for discharge from the hospital within 2 midnights of admission.   * I certify that at the point of admission it is my clinical judgment that the patient will require inpatient hospital care spanning beyond 2 midnights from the point of admission due to high intensity of service, high risk for further deterioration and high frequency of surveillance required.*  Author: Sriya Kroeze J Nohea Kras, DO 10/23/2023 6:30 PM  For on call review [email or web address].

## 2023-10-23 NOTE — ED Notes (Signed)
 Sharene Dauer from the lab called and stated that this is not the patients correct troponin level, she stated that whoever drew this patients lab work mislabeled patients blood and it was ran and not caught before they resulted.               Date Time Full Time Type Event Details User Name User             5/2 1509 15:09:17  Troponin I (High Sensitivity) Resulted Abnormal result Collected: 10/23/2023 13:39 Last Updated: 10/23/2023 15:09 Status: Final result Troponin I (High Sensitivity) 64 ng/L [Ref Range: <18]Comment: (NOTE) Elevated high sensitivity troponin I (hsTnI) values and significant changes across serial measurements may suggest ACS but many other chronic and acute conditions are known to elevate hsTnI results. Refer to the "Links" section for chest pain algorithms and additional guidance. Performed at [facility], [address], [place], [place] [address]    Interface, Lab In [place]

## 2023-10-24 DIAGNOSIS — J9601 Acute respiratory failure with hypoxia: Secondary | ICD-10-CM

## 2023-10-24 LAB — RESP PANEL BY RT-PCR (RSV, FLU A&B, COVID)  RVPGX2
Influenza A by PCR: NEGATIVE
Influenza B by PCR: NEGATIVE
Resp Syncytial Virus by PCR: NEGATIVE
SARS Coronavirus 2 by RT PCR: NEGATIVE

## 2023-10-24 LAB — GLUCOSE, CAPILLARY
Glucose-Capillary: 269 mg/dL — ABNORMAL HIGH (ref 70–99)
Glucose-Capillary: 262 mg/dL — ABNORMAL HIGH (ref 70–99)
Glucose-Capillary: 150 mg/dL — ABNORMAL HIGH (ref 70–99)
Glucose-Capillary: 249 mg/dL — ABNORMAL HIGH (ref 70–99)

## 2023-10-24 LAB — BASIC METABOLIC PANEL WITH GFR
Anion gap: 13 (ref 5–15)
BUN: 26 mg/dL — ABNORMAL HIGH (ref 8–23)
CO2: 24 mmol/L (ref 22–32)
Calcium: 9.1 mg/dL (ref 8.9–10.3)
Chloride: 105 mmol/L (ref 98–111)
Creatinine, Ser: 1.09 mg/dL (ref 0.61–1.24)
GFR, Estimated: >60 mL/min (ref >60)
Glucose, Bld: 257 mg/dL — ABNORMAL HIGH (ref 70–99)
Potassium: 3.6 mmol/L (ref 3.5–5.1)
Sodium: 142 mmol/L (ref 135–145)

## 2023-10-24 MED ORDER — NICOTINE 21 MG/24HR TD PT24
21.0000 mg | MEDICATED_PATCH | Freq: Every day | TRANSDERMAL | Status: DC
  Administered 2023-10-24 – 2023-10-26 (×3): 21 mg via TRANSDERMAL
  Filled 2023-10-24 (×3): qty 1

## 2023-10-24 MED ORDER — METHYLPREDNISOLONE SODIUM SUCC 40 MG IJ SOLR
40.0000 mg | Freq: Two times a day (BID) | INTRAMUSCULAR | Status: DC
  Administered 2023-10-24 – 2023-10-26 (×4): 40 mg via INTRAVENOUS
  Filled 2023-10-24 (×4): qty 1

## 2023-10-24 MED ORDER — DM-GUAIFENESIN ER 30-600 MG PO TB12
1.0000 | ORAL_TABLET | Freq: Two times a day (BID) | ORAL | Status: DC
  Administered 2023-10-24 – 2023-10-26 (×4): 1 via ORAL
  Filled 2023-10-24 (×4): qty 1

## 2023-10-24 MED ORDER — INSULIN GLARGINE-YFGN 100 UNIT/ML ~~LOC~~ SOLN
8.0000 [IU] | Freq: Every day | SUBCUTANEOUS | Status: DC
  Administered 2023-10-24 – 2023-10-25 (×2): 8 [IU] via SUBCUTANEOUS
  Filled 2023-10-24 (×3): qty 0.08

## 2023-10-24 MED ORDER — IPRATROPIUM-ALBUTEROL 0.5-2.5 (3) MG/3ML IN SOLN
3.0000 mL | Freq: Three times a day (TID) | RESPIRATORY_TRACT | Status: DC
  Administered 2023-10-25 – 2023-10-26 (×4): 3 mL via RESPIRATORY_TRACT
  Filled 2023-10-24 (×4): qty 3

## 2023-10-24 MED ORDER — SODIUM CHLORIDE 0.9 % IV SOLN
500.0000 mg | INTRAVENOUS | Status: DC
  Administered 2023-10-24 – 2023-10-25 (×2): 500 mg via INTRAVENOUS
  Filled 2023-10-24 (×2): qty 5

## 2023-10-24 MED ORDER — CEFTRIAXONE SODIUM 1 G IJ SOLR
1.0000 g | INTRAMUSCULAR | Status: AC
  Administered 2023-10-24: 1 g via INTRAVENOUS
  Filled 2023-10-24: qty 10

## 2023-10-24 NOTE — Progress Notes (Signed)
 Patient setup on hospital CPAP per his sleep report settings. Patient tolerating well at this time.

## 2023-10-24 NOTE — Plan of Care (Signed)

## 2023-10-24 NOTE — Plan of Care (Signed)
   Problem: Education: Goal: Knowledge of General Education information will improve Description Including pain rating scale, medication(s)/side effects and non-pharmacologic comfort measures Outcome: Progressing   Problem: Health Behavior/Discharge Planning: Goal: Ability to manage health-related needs will improve Outcome: Progressing

## 2023-10-24 NOTE — Progress Notes (Signed)
 PROGRESS NOTE  Kevin Frank, is a 83 y.o. male, DOB - [birth year], ZOX:[id number]  Admit date - 10/23/2023   Admitting Physician Kevin Sea, DO  Outpatient Primary MD for the patient is Kevin Parkins, MD  LOS - 1  Chief Complaint  Patient presents with   Shortness of Breath   Brief Narrative:  83 y.o. male with medical history significant of COPD with continued tobacco abuse, type 2 diabetes, BPH, hypertension, obstructive sleep apnea, AAA and dementia admitted on 10/23/2023 with acute on chronic hypoxic respiratory failure secondary to COPD exacerbation    -Assessment and Plan: 1) acute COPD exacerbation-----in a patient with ongoing tobacco use -No definite pneumonia - Continue IV Solu-Medrol , mucolytics, bronchodilators --add azithromycin  - Patient remains symptomatic with cough wheezing dyspnea and hypoxia  2)Tobacco abuse ---patient smokes about 2 packs of cigarettes per day Smoking cessation counseling for 4 minutes today,  Give nicotine  patch I have discussed tobacco cessation with the patient.  I have counseled the patient regarding the negative impacts of continued tobacco use including but not limited to lung cancer, COPD, and cardiovascular disease.  I have discussed alternatives to tobacco and modalities that may help facilitate tobacco cessation including but not limited to biofeedback, hypnosis, and medications.  Total time spent with tobacco counseling was 4 minutes.  3) acute on chronic hypoxic respiratory failure--- due to #1 above  --prior to admission patient will use oxygen  nightly with his CPAP - Patient is Now requiring about 5 L of oxygen  via nasal cannula continuously at this time -- Anticipate improvement with treatment of #1 above  4)PAFib/Atrial Flutter--apixaban  for stroke prophylaxis  5)DM2-A1c 8.4 reflecting uncontrolled DM with hyperglycemia PTA -Anticipate worsening hyperglycemia with steroid - Hold metformin  - Give Semglee  insulin  8 units  daily Use Novolog /Humalog Sliding scale insulin  with Accu-Cheks/Fingersticks as ordered   6)Class 1 Obesity/OSA -Low calorie diet, portion control and increase physical activity discussed with patient -Body mass index is 32.14 kg/m. Cpap at bedtime with 5 L/min of oxygen  being bled in  7)BPH--continue tamsulosin   8)HTN--continue losartan   9) dementia--- continue Kevin Frank  10)HLD--- continue atorvastatin   Status is: Inpatient   Disposition: The patient is from: Home              Anticipated d/c is to: Home              Anticipated d/c date is: 1 day              Patient currently is not medically stable to d/c. Barriers: Not Clinically Stable-   Code Status :  -  Code Status: Full Code   Family Communication:    (patient is alert, awake and coherent)  Discussed with patient's son Kevin Frank at bedside  DVT Prophylaxis  :   - SCDs / apixaban  (ELIQUIS ) tablet 5 mg   Lab Results  Component Value Date   PLT 158 10/23/2023    Inpatient Medications  Scheduled Meds:  apixaban   5 mg Oral BID   atorvastatin   80 mg Oral Daily   budeson-glycopyrrolate -formoterol   2 puff Inhalation BID   clopidogrel   75 mg Oral Daily   guaiFENesin   600 mg Oral BID   insulin  aspart  0-20 Units Subcutaneous TID WC   insulin  aspart  0-5 Units Subcutaneous QHS   ipratropium-albuterol   3 mL Nebulization Q6H   losartan   25 mg Oral Daily   memantine  5 mg Oral BID   metFORMIN   1,000 mg Oral BID WC   potassium chloride   40 mEq Oral BID   predniSONE   40 mg Oral Q breakfast   sertraline   50 mg Oral Daily   tamsulosin   0.4 mg Oral QPC supper   torsemide   20 mg Oral Daily   Continuous Infusions:  cefTRIAXone  (ROCEPHIN )  IV 1 g (10/24/23 0104)   PRN Meds:.albuterol , nicotine , ondansetron  **OR** ondansetron  (ZOFRAN ) IV   Anti-infectives (From admission, onward)    Start     Dose/Rate Route Frequency Ordered Stop   10/24/23 0000  cefTRIAXone  (ROCEPHIN ) 1 g in sodium chloride  0.9 % 100 mL IVPB        1  g 200 mL/hr over 30 Minutes Intravenous Every 24 hours 10/23/23 1828     10/23/23 1615  cefTRIAXone  (ROCEPHIN ) 1 g in sodium chloride  0.9 % 100 mL IVPB        1 g 200 mL/hr over 30 Minutes Intravenous  Once 10/23/23 1607 10/23/23 1711      Subjective: Kevin Frank today has no fevers, no emesis,  No chest pain,   Son Kevin Frank at bedside, questions answered - Cough dyspnea and wheezing with hypoxia persist -  Objective: Vitals:   10/24/23 0840 10/24/23 0850 10/24/23 1124 10/24/23 1437  BP:   (!) 140/70   Pulse:   80   Resp:      Temp:      TempSrc:      SpO2: 94% 98% 96% 91%  Weight:      Height:        Intake/Output Summary (Last 24 hours) at 10/24/2023 1521 Last data filed at 10/24/2023 1458 Gross per 24 hour  Intake 1157.34 ml  Output --  Net 1157.34 ml   Filed Weights   10/23/23 1300  Weight: 101.6 kg   Physical Exam Gen:- Awake Alert, dyspnea on exertion persist HEENT:- [place].AT, No sclera icterus Nose-- [place] 5L/min Neck-Supple Neck,No JVD,.  Lungs-diminished breath sounds with scattered wheezes bilaterally  CV- S1, S2 normal, irregular  Abd-  +ve B.Sounds, Abd Soft, No tenderness,    Extremity/Skin:- No  edema, pedal pulses present  Psych-affect is appropriate, oriented x3 Neuro-no new focal deficits, no tremors  Data Reviewed: I have personally reviewed following labs and imaging studies  CBC: Recent Labs  Lab 10/23/23 1346  WBC 13.9*  HGB 14.8  HCT 44.1  MCV 94.8  PLT 158   Basic Metabolic Panel: Recent Labs  Lab 10/23/23 1346 10/24/23 0504  NA 138 142  K 3.2* 3.6  CL 102 105  CO2 26 24  GLUCOSE 156* 257*  BUN 19 26*  CREATININE 1.06 1.09  CALCIUM  9.3 9.1   GFR: Estimated Creatinine Clearance: 62.4 mL/min (by C-G formula based on SCr of 1.09 mg/dL).  Radiology Studies: DG Chest Port 1 View Result Date: 10/23/2023 CLINICAL DATA:  Shortness of breath, weakness and cough EXAM: PORTABLE CHEST 1 VIEW COMPARISON:  08/07/2023 FINDINGS:  Atherosclerotic calcification of the aortic arch. Mild enlargement of the cardiopericardial silhouette Apical lordotic projection.  Centrilobular emphysema. Mild interstitial accentuation, as can commonly be encountered in smokers. The lungs appear otherwise clear. IMPRESSION: 1. Mild enlargement of the cardiopericardial silhouette. 2. Aortic Atherosclerosis (ICD10-I70.0) and Emphysema (ICD10-J43.9). Electronically Signed   By: Freida Jes M.D.   On: 10/23/2023 14:10   Scheduled Meds:  apixaban   5 mg Oral BID   atorvastatin   80 mg Oral Daily   budeson-glycopyrrolate -formoterol   2 puff Inhalation BID   clopidogrel   75 mg Oral Daily   guaiFENesin   600 mg Oral BID  insulin  aspart  0-20 Units Subcutaneous TID WC   insulin  aspart  0-5 Units Subcutaneous QHS   ipratropium-albuterol   3 mL Nebulization Q6H   losartan   25 mg Oral Daily   memantine  5 mg Oral BID   metFORMIN   1,000 mg Oral BID WC   potassium chloride   40 mEq Oral BID   predniSONE   40 mg Oral Q breakfast   sertraline   50 mg Oral Daily   tamsulosin   0.4 mg Oral QPC supper   torsemide   20 mg Oral Daily   Continuous Infusions:  cefTRIAXone  (ROCEPHIN )  IV 1 g (10/24/23 0104)    LOS: 1 day   Colin Dawley M.D on 10/24/2023 at 3:21 PM  Go to [email or web address] - for contact info  [facility] - Office  [phone]  If 7PM-7AM, please contact night-coverage [email or web address] 10/24/2023, 3:21 PM

## 2023-10-25 DIAGNOSIS — J9601 Acute respiratory failure with hypoxia: Secondary | ICD-10-CM | POA: Diagnosis not present

## 2023-10-25 LAB — GLUCOSE, CAPILLARY
Glucose-Capillary: 163 mg/dL — ABNORMAL HIGH (ref 70–99)
Glucose-Capillary: 302 mg/dL — ABNORMAL HIGH (ref 70–99)
Glucose-Capillary: 232 mg/dL — ABNORMAL HIGH (ref 70–99)
Glucose-Capillary: 247 mg/dL — ABNORMAL HIGH (ref 70–99)

## 2023-10-25 MED ORDER — INSULIN GLARGINE-YFGN 100 UNIT/ML ~~LOC~~ SOLN
12.0000 [IU] | Freq: Every day | SUBCUTANEOUS | Status: DC
  Administered 2023-10-26: 12 [IU] via SUBCUTANEOUS
  Filled 2023-10-25 (×2): qty 0.12

## 2023-10-25 MED ORDER — SENNOSIDES-DOCUSATE SODIUM 8.6-50 MG PO TABS
2.0000 | ORAL_TABLET | Freq: Every day | ORAL | Status: DC
  Administered 2023-10-25: 2 via ORAL
  Filled 2023-10-25: qty 2

## 2023-10-25 MED ORDER — DOCUSATE SODIUM 100 MG PO CAPS: 100.0000 mg | ORAL_CAPSULE | Freq: Two times a day (BID) | ORAL | Status: DC

## 2023-10-25 NOTE — Progress Notes (Signed)
 PROGRESS NOTE  Kevin Frank, is a 83 y.o. male, DOB - 1941-04-20, ZOX:[id number]  Admit date - 10/23/2023   Admitting Physician Malka Sea, DO  Outpatient Primary MD for the patient is [name], MD  LOS - 2  Chief Complaint  Patient presents with   Shortness of Breath   Brief Narrative:  83 y.o. male with medical history significant of COPD with continued tobacco abuse, type 2 diabetes, BPH, hypertension, obstructive sleep apnea, AAA and dementia admitted on 10/23/2023 with acute on chronic hypoxic respiratory failure secondary to COPD exacerbation    -Assessment and Plan: 1)Acute COPD Exacerbation-----in a patient with ongoing tobacco use -No definite pneumonia - Continue IV Solu-Medrol , mucolytics, bronchodilators --added azithromycin  - Hypoxia, cough,  and wheezing persist -No significant dyspnea at rest dyspnea on exertion persist  2)Tobacco abuse ---patient smokes about 2 packs of cigarettes per day -Continue nicotine  patch  3) acute on chronic hypoxic respiratory failure--- due to #1 above  --prior to admission patient will use oxygen  nightly with his CPAP - Patient has been weaned down to 3.5 L of oxygen  via nasal cannula continuously at this time -- Anticipate improvement with treatment of #1 above  4)PAFib/Atrial Flutter--apixaban  for stroke prophylaxis  5)DM2-A1c 8.4 reflecting uncontrolled DM with hyperglycemia PTA -Anticipate worsening hyperglycemia with steroid - Hold metformin  --Increase Semglee  insulin  to 12 units daily Use Novolog /Humalog Sliding scale insulin  with Accu-Cheks/Fingersticks as ordered   6)Class 1 Obesity/OSA -Low calorie diet, portion control and increase physical activity discussed with patient -Body mass index is 32.14 kg/m. Cpap at bedtime with 5 L/min of oxygen  being bled in  7)BPH--continue tamsulosin   8)HTN--continue losartan   9)Dementia--- continue Namenda  10)HLD--- continue atorvastatin   Status is: Inpatient    Disposition: The patient is from: Home              Anticipated d/c is to: Home              Anticipated d/c date is: 1 day              Patient currently is not medically stable to d/c. Barriers: Not Clinically Stable-   Code Status :  -  Code Status: Full Code   Family Communication:    (patient is alert, awake and coherent)  Discussed with patient's son [name] at bedside  DVT Prophylaxis  :   - SCDs / apixaban  (ELIQUIS ) tablet 5 mg   Lab Results  Component Value Date   PLT 158 10/23/2023   Inpatient Medications  Scheduled Meds:  apixaban   5 mg Oral BID   atorvastatin   80 mg Oral Daily   budeson-glycopyrrolate -formoterol   2 puff Inhalation BID   clopidogrel   75 mg Oral Daily   dextromethorphan -guaiFENesin   1 tablet Oral BID   insulin  aspart  0-20 Units Subcutaneous TID WC   insulin  aspart  0-5 Units Subcutaneous QHS   insulin  glargine-yfgn  8 Units Subcutaneous Daily   ipratropium-albuterol   3 mL Nebulization TID   losartan   25 mg Oral Daily   memantine  5 mg Oral BID   methylPREDNISolone  (SOLU-MEDROL ) injection  40 mg Intravenous Q12H   nicotine   21 mg Transdermal Daily   potassium chloride   40 mEq Oral BID   senna-docusate  2 tablet Oral QHS   sertraline   50 mg Oral Daily   tamsulosin   0.4 mg Oral QPC supper   torsemide   20 mg Oral Daily   Continuous Infusions:  azithromycin  250 mL/hr at 10/24/23 1746   PRN Meds:.albuterol ,  ondansetron  **OR** ondansetron  (ZOFRAN ) IV   Anti-infectives (From admission, onward)    Start     Dose/Rate Route Frequency Ordered Stop   10/25/23 0000  cefTRIAXone  (ROCEPHIN ) 1 g in sodium chloride  0.9 % 100 mL IVPB        1 g 200 mL/hr over 30 Minutes Intravenous Every 24 hours 10/24/23 1528 10/25/23 0009   10/24/23 1615  azithromycin  (ZITHROMAX ) 500 mg in sodium chloride  0.9 % 250 mL IVPB        500 mg 250 mL/hr over 60 Minutes Intravenous Every 24 hours 10/24/23 1528     10/24/23 0000  cefTRIAXone  (ROCEPHIN ) 1 g in sodium chloride   0.9 % 100 mL IVPB  Status:  Discontinued        1 g 200 mL/hr over 30 Minutes Intravenous Every 24 hours 10/23/23 1828 10/24/23 1528   10/23/23 1615  cefTRIAXone  (ROCEPHIN ) 1 g in sodium chloride  0.9 % 100 mL IVPB        1 g 200 mL/hr over 30 Minutes Intravenous  Once 10/23/23 1607 10/23/23 1711      Subjective: [name] today has no fevers, no emesis,  No chest pain,   - Hypoxia, cough,  and wheezing persist -Cough is at times productive -No significant dyspnea at rest dyspnea on exertion persist - Compliant with incentive spirometry  Objective: Vitals:   10/25/23 0829 10/25/23 0832 10/25/23 1203 10/25/23 1403  BP:   (!) 159/74   Pulse:   81   Resp:      Temp:   97.8 F (36.6 C)   TempSrc:   Oral   SpO2: 97% 99% 93% 95%  Weight:      Height:        Intake/Output Summary (Last 24 hours) at 10/25/2023 1600 Last data filed at 10/25/2023 1403 Gross per 24 hour  Intake 1089.84 ml  Output 550 ml  Net 539.84 ml   Filed Weights   10/23/23 1300  Weight: 101.6 kg   Physical Exam Gen:- Awake Alert, dyspnea on exertion persist HEENT:- [place].AT, No sclera icterus Nose-- [place] 3.5L/min Neck-Supple Neck,No JVD,.  Lungs-improving air movement, scattered wheezes noted CV- S1, S2 normal, irregular  Abd-  +ve B.Sounds, Abd Soft, No tenderness,    Extremity/Skin:- No  edema, pedal pulses present  Psych-affect is appropriate, oriented x3 Neuro-no new focal deficits, no tremors  Data Reviewed: I have personally reviewed following labs and imaging studies  CBC: Recent Labs  Lab 10/23/23 1346  WBC 13.9*  HGB 14.8  HCT 44.1  MCV 94.8  PLT 158   Basic Metabolic Panel: Recent Labs  Lab 10/23/23 1346 10/24/23 0504  NA 138 142  K 3.2* 3.6  CL 102 105  CO2 26 24  GLUCOSE 156* 257*  BUN 19 26*  CREATININE 1.06 1.09  CALCIUM  9.3 9.1   GFR: Estimated Creatinine Clearance: 62.4 mL/min (by C-G formula based on SCr of 1.09 mg/dL).  Radiology Studies: No results  found.  Scheduled Meds:  apixaban   5 mg Oral BID   atorvastatin   80 mg Oral Daily   budeson-glycopyrrolate -formoterol   2 puff Inhalation BID   clopidogrel   75 mg Oral Daily   dextromethorphan -guaiFENesin   1 tablet Oral BID   insulin  aspart  0-20 Units Subcutaneous TID WC   insulin  aspart  0-5 Units Subcutaneous QHS   insulin  glargine-yfgn  8 Units Subcutaneous Daily   ipratropium-albuterol   3 mL Nebulization TID   losartan   25 mg Oral Daily   memantine  5  mg Oral BID   methylPREDNISolone  (SOLU-MEDROL ) injection  40 mg Intravenous Q12H   nicotine   21 mg Transdermal Daily   potassium chloride   40 mEq Oral BID   senna-docusate  2 tablet Oral QHS   sertraline   50 mg Oral Daily   tamsulosin   0.4 mg Oral QPC supper   torsemide   20 mg Oral Daily   Continuous Infusions:  azithromycin  250 mL/hr at 10/24/23 1746    LOS: 2 days   Colin Dawley M.D on 10/25/2023 at 4:00 PM  Go to [email or web address] - for contact info  [facility] - Office  [phone]  If 7PM-7AM, please contact night-coverage [email or web address] 10/25/2023, 4:00 PM

## 2023-10-25 NOTE — Plan of Care (Signed)
  Problem: Clinical Measurements: Goal: Will remain free from infection Outcome: Progressing Goal: Diagnostic test results will improve Outcome: Progressing Goal: Respiratory complications will improve Outcome: Progressing Goal: Cardiovascular complication will be avoided Outcome: Progressing   Problem: Activity: Goal: Risk for activity intolerance will decrease Outcome: Progressing   Problem: Nutrition: Goal: Adequate nutrition will be maintained Outcome: Adequate for Discharge   Problem: Coping: Goal: Level of anxiety will decrease Outcome: Adequate for Discharge

## 2023-10-26 DIAGNOSIS — J9601 Acute respiratory failure with hypoxia: Secondary | ICD-10-CM | POA: Diagnosis not present

## 2023-10-26 LAB — GLUCOSE, CAPILLARY
Glucose-Capillary: 210 mg/dL — ABNORMAL HIGH (ref 70–99)
Glucose-Capillary: 273 mg/dL — ABNORMAL HIGH (ref 70–99)

## 2023-10-26 MED ORDER — FAMOTIDINE 20 MG PO TABS: 20.0000 mg | ORAL_TABLET | Freq: Two times a day (BID) | ORAL | 3 refills | Status: DC

## 2023-10-26 MED ORDER — HYDRALAZINE HCL 20 MG/ML IJ SOLN: 10.0000 mg | Freq: Four times a day (QID) | INTRAMUSCULAR | Status: DC | PRN

## 2023-10-26 MED ORDER — METOPROLOL SUCCINATE ER 50 MG PO TB24: 50.0000 mg | ORAL_TABLET | Freq: Two times a day (BID) | ORAL | 1 refills | Status: DC

## 2023-10-26 MED ORDER — TAMSULOSIN HCL 0.4 MG PO CAPS: 0.4000 mg | ORAL_CAPSULE | Freq: Every day | ORAL | 3 refills | Status: DC

## 2023-10-26 MED ORDER — METFORMIN HCL 500 MG PO TABS: 1000.0000 mg | ORAL_TABLET | Freq: Two times a day (BID) | ORAL | 5 refills | Status: DC

## 2023-10-26 MED ORDER — ALBUTEROL SULFATE (2.5 MG/3ML) 0.083% IN NEBU: 2.5000 mg | INHALATION_SOLUTION | RESPIRATORY_TRACT | 2 refills | Status: AC | PRN

## 2023-10-26 MED ORDER — TORSEMIDE 20 MG PO TABS: 20.0000 mg | ORAL_TABLET | Freq: Every day | ORAL | 2 refills | Status: DC

## 2023-10-26 MED ORDER — AZITHROMYCIN 500 MG PO TABS: 500.0000 mg | ORAL_TABLET | Freq: Every day | ORAL | 0 refills | Status: AC

## 2023-10-26 MED ORDER — POTASSIUM CHLORIDE ER 10 MEQ PO TBCR: 10.0000 meq | EXTENDED_RELEASE_TABLET | Freq: Every day | ORAL | 1 refills | Status: DC

## 2023-10-26 MED ORDER — LOSARTAN POTASSIUM 50 MG PO TABS
50.0000 mg | ORAL_TABLET | Freq: Every day | ORAL | Status: DC
  Administered 2023-10-26: 50 mg via ORAL
  Filled 2023-10-26: qty 1

## 2023-10-26 MED ORDER — SERTRALINE HCL 50 MG PO TABS: 50.0000 mg | ORAL_TABLET | Freq: Every day | ORAL | 1 refills | Status: DC

## 2023-10-26 MED ORDER — NICOTINE 21 MG/24HR TD PT24: 21.0000 mg | MEDICATED_PATCH | Freq: Every day | TRANSDERMAL | 0 refills | Status: DC

## 2023-10-26 MED ORDER — PREDNISONE 20 MG PO TABS: 40.0000 mg | ORAL_TABLET | Freq: Every day | ORAL | 0 refills | Status: AC

## 2023-10-26 MED ORDER — GUAIFENESIN ER 600 MG PO TB12
600.0000 mg | ORAL_TABLET | Freq: Two times a day (BID) | ORAL | 2 refills | Status: DC
Start: 2023-10-26 — End: 2024-04-13

## 2023-10-26 MED ORDER — TRELEGY ELLIPTA 100-62.5-25 MCG/ACT IN AEPB: 1.0000 | INHALATION_SPRAY | Freq: Every day | RESPIRATORY_TRACT | 1 refills | Status: DC

## 2023-10-26 MED ORDER — EPINEPHRINE 0.3 MG/0.3ML IJ SOAJ: 0.3000 mg | INTRAMUSCULAR | 1 refills | Status: AC | PRN

## 2023-10-26 MED ORDER — ALBUTEROL SULFATE HFA 108 (90 BASE) MCG/ACT IN AERS: 2.0000 | INHALATION_SPRAY | RESPIRATORY_TRACT | 2 refills | Status: DC | PRN

## 2023-10-26 MED ORDER — LOSARTAN POTASSIUM 50 MG PO TABS: 50.0000 mg | ORAL_TABLET | Freq: Every day | ORAL | 5 refills | Status: DC

## 2023-10-26 NOTE — TOC Transition Note (Signed)
 Transition of Care [facility]) - Discharge Note   Patient Details  Name: Kevin Frank MRN: [id number] Date of Birth: 08-15-1940  Transition of [facility]) CM/SW Contact:  Grandville Lax, LCSWA Phone Number: 10/26/2023, 10:03 AM   Clinical Narrative:    Pt is high risk for readmission. Pt lives with his wife. Pt is independent at home with completing ADLs. Pt is able to drive to appointments when needed. Pt has had HH in the past but not currently. Pt has a walker but does not use it. Pt has home O2 supplied through [employer]. TOC signing off.   Final next level of care: Home/Self Care Barriers to Discharge: Barriers Resolved   Patient Goals and CMS Choice Patient states their goals for this hospitalization and ongoing recovery are:: return home CMS Medicare.gov Compare Post Acute Care list provided to:: Patient Choice offered to / list presented to : Patient      Discharge Placement                       Discharge Plan and Services Additional resources added to the After Visit Summary for                                       Social Drivers of Health (SDOH) Interventions SDOH Screenings   Food Insecurity: No Food Insecurity (10/23/2023)  Housing: Low Risk  (10/23/2023)  Transportation Needs: No Transportation Needs (10/23/2023)  Utilities: Not At Risk (10/23/2023)  Depression (PHQ2-9): Low Risk  (09/07/2019)  Financial Resource Strain: Low Risk  (03/05/2022)  Social Connections: Socially Integrated (10/23/2023)  Tobacco Use: High Risk (10/23/2023)     Readmission Risk Interventions    10/26/2023   10:03 AM 10/23/2023    9:53 PM  Readmission Risk Prevention Plan  Transportation Screening Complete Complete  PCP or Specialist Appt within 5-7 Days  Complete  Home Care Screening  Complete  Medication Review (RN CM)  Complete  HRI or Home Care Consult Complete   Social Work Consult for Recovery Care Planning/Counseling Complete   Palliative Care Screening Not Applicable    Medication Review Oceanographer) Complete

## 2023-10-26 NOTE — Evaluation (Signed)
 Clinical/Bedside Swallow Evaluation Patient Details  Name: Kevin Frank MRN: [id number] Date of Birth: 05/01/1941  Today's Date: 10/26/2023 Time: SLP Start Time (ACUTE ONLY): 0945 SLP Stop Time (ACUTE ONLY): 1015 SLP Time Calculation (min) (ACUTE ONLY): 30 min  Past Medical History:  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    Normal nuclear stress test 2011   Obesity    Obstructive sleep apnea on CPAP    Presence of indwelling urinary catheter    Shortness of breath    due to cigarette abuse   Tobacco abuse    Past Surgical History:  Past Surgical History:  Procedure Laterality Date   ABDOMINAL AORTIC ENDOVASCULAR FENESTRATED STENT GRAFT N/A 04/09/2016   Procedure: ABDOMINAL AORTIC ENDOVASCULAR FENESTRATED STENT GRAFT with left brachial artery access using ultrasound guidance;  Surgeon: Margherita Shell, MD;  Location: [facility] OR;  Service: Vascular;  Laterality: N/A;   CHOLECYSTECTOMY  1997   [facility]   COLONOSCOPY  2006   2006: multiple diminutive polyps in rectum, one pedunculated polyp at 10 cm s/p snare removal. Left-sided diverticulum, pedunculated polyps at 35 cm and splenic flexure s/p snare removal. No path available.    COLONOSCOPY WITH PROPOFOL  N/A 11/24/2019   Procedure: COLONOSCOPY WITH PROPOFOL ;  Surgeon: Suzette Espy, MD;  Location: [facility] ENDO SUITE;  Service: Endoscopy;  Laterality: N/A;  9:45am   CORONARY/GRAFT ACUTE MI REVASCULARIZATION N/A 08/04/2023   Procedure: Coronary/Graft Acute MI Revascularization;  Surgeon: Kyra Phy, MD;  Location:  [facility] INVASIVE CV LAB;  Service: Cardiovascular;  Laterality: N/A;   PERIPHERAL VASCULAR CATHETERIZATION N/A 04/10/2016   Procedure: Renal Angiography;  Surgeon: Dannis Dy, MD;  Location: [facility] INVASIVE CV LAB;  Service: Cardiovascular;  Laterality: N/A;   POLYPECTOMY  11/24/2019   Procedure: POLYPECTOMY;  Surgeon: Suzette Espy, MD;  Location: [facility] ENDO SUITE;  Service: Endoscopy;;   TRANSURETHRAL RESECTION OF PROSTATE  01/07/2011   Procedure: TRANSURETHRAL RESECTION OF THE PROSTATE (TURP);  Surgeon: Mohammad I Javaid;  Location: [facility] ORS;  Service: Urology;  Laterality: N/A;   HPI:  [name] is an 83 y.o. Kevin with medical history significant of COPD with continued tobacco abuse, type 2 diabetes, BPH, hypertension, obstructive sleep apnea, AAA and dementia admitted on 10/23/2023 with acute on chronic hypoxic respiratory failure secondary to COPD exacerbation. BSE requested    Assessment / Plan / Recommendation  Clinical Impression  Clinical swallowing evaluation completed while Pt was sitting upright in bed. Pt consumed all textures and consistencies without overt s/sx of aspiration. SLP reviewed universal aspiration precautions with Pt. Recommend continue current diet - regular/thin. Meds are ok whole with liquids. No further ST needs noted, our service will sign off. Thank you for this referral, SLP Visit Diagnosis: Dysphagia, unspecified (R13.10)    Aspiration Risk       Diet Recommendation Regular;Thin liquid    Liquid Administration via: Cup;Straw Medication Administration: Whole meds with liquid  Supervision: Patient able to self feed Compensations: Slow rate;Small sips/bites Postural Changes: Seated upright at 90 degrees    Other  Recommendations Oral Care Recommendations: Oral care BID    Recommendations for follow up therapy are one component of a multi-disciplinary discharge planning process, led by the attending physician.  Recommendations may be updated based on  patient status, additional functional criteria and insurance authorization.  Follow up Recommendations No SLP follow up        Swallow Study   General Date of Onset: 10/23/23 HPI: [name] is an 83 y.o. Kevin with medical history significant of COPD with continued tobacco abuse, type 2 diabetes, BPH, hypertension, obstructive sleep apnea, AAA and dementia admitted on 10/23/2023 with acute on chronic hypoxic respiratory failure secondary to COPD exacerbation. BSE requested Type of Study: Bedside Swallow Evaluation Previous Swallow Assessment: none in chart Diet Prior to this Study: Thin liquids (Level 0);Regular Temperature Spikes Noted: No Respiratory Status: Nasal cannula History of Recent Intubation: No Oral Cavity - Dentition: Adequate natural dentition Vision: Functional for self-feeding Self-Feeding Abilities: Able to feed self Patient Positioning: Upright in bed Baseline Vocal Quality: Normal Volitional Cough: Strong Volitional Swallow: Able to elicit    Oral/Motor/Sensory Function Overall Oral Motor/Sensory Function: Within functional limits   Ice Chips Ice chips: Within functional limits   Thin Liquid Thin Liquid: Within functional limits    Nectar Thick Nectar Thick Liquid: Not tested   Honey Thick Honey Thick Liquid: Not tested   Puree Puree: Within functional limits   Solid     Solid: Within functional limits     Margurite Duffy H. Vergil Glasser, CCC-SLP Speech Language Pathologist  Florina Husbands 10/26/2023,12:52 PM

## 2023-10-26 NOTE — Care Management Important Message (Signed)
 Important Message  Patient Details  Name: Kevin Frank MRN: [id number] Date of Birth: 1941-05-13   Important Message Given:  Yes - Medicare IM     [name] [name] 10/26/2023, 10:36 AM

## 2023-10-26 NOTE — Discharge Instructions (Signed)
 1)Avoid ibuprofen /Advil /Aleve/Motrin Juluis Ok Powders/Naproxen/BC powders/Meloxicam /Diclofenac /Indomethacin and other Nonsteroidal anti-inflammatory medications as these will make you more likely to bleed and can cause stomach ulcers, can also cause Kidney problems.   2)Watch for bleeding while on Blood Thinners--watch for blood in your stool which can make your stool black, maroon, mahogany or red---, blood in your urine which can make your urine pink or red, nosebleeds , also watch for possible bruising -You are taking Apixaban /Eliquis  and Clopidogrel /Plavix --- which are blood thinners--- be careful to avoid injury or falls  3)Patient needs continuous O2 at 3 L/min continuously via nasal cannula with humidifier while awake, he needs Oxygen  at 5L/min "bleeding" into his CPAP while asleep, with gaseous portability and conserving device   -You need oxygen  at home via nasal cannula continuously while awake and while asleep as outlined above--- smoking or having open fires around oxygen  can cause fire, significant injury and death.  4)Complete abstinence from tobacco advised--- okay to use over-the-counter nicotine  patch to help you quit smoking

## 2023-10-26 NOTE — Discharge Summary (Signed)
 Kevin Frank, is a 83 y.o. male  DOB February 25, 1941  MRN [id number].  Admission date:  10/23/2023  Admitting Physician  Malka Sea, DO  Discharge Date:  10/26/2023   Primary MD  [name], MD  Recommendations for primary care physician for things to follow:  1)Avoid ibuprofen /Advil /Aleve/Motrin Juluis Ok Powders/Naproxen/BC powders/Meloxicam /Diclofenac /Indomethacin and other Nonsteroidal anti-inflammatory medications as these will make you more likely to bleed and can cause stomach ulcers, can also cause Kidney problems.   2)Watch for bleeding while on Blood Thinners--watch for blood in your stool which can make your stool black, maroon, mahogany or red---, blood in your urine which can make your urine pink or red, nosebleeds , also watch for possible bruising -You are taking Apixaban /Eliquis  and Clopidogrel /Plavix --- which are blood thinners--- be careful to avoid injury or falls  3)Patient needs continuous O2 at 3 L/min continuously via nasal cannula with humidifier while awake, he needs Oxygen  at 5L/min "bleeding" into his CPAP while asleep, with gaseous portability and conserving device   -You need oxygen  at home via nasal cannula continuously while awake and while asleep as outlined above--- smoking or having open fires around oxygen  can cause fire, significant injury and death.  4)Complete abstinence from tobacco advised--- okay to use over-the-counter nicotine  patch to help you quit smoking  Admission Diagnosis  COPD exacerbation (HCC) [J44.1] Acute respiratory failure with hypoxia (HCC) [J96.01] Acute on chronic hypoxic respiratory failure (HCC) [J96.21]   Discharge Diagnosis  COPD exacerbation (HCC) [J44.1] Acute respiratory failure with hypoxia (HCC) [J96.01] Acute on chronic hypoxic respiratory failure (HCC) [J96.21]    Principal Problem:   Acute respiratory failure with hypoxia (HCC) Active  Problems:   Essential hypertension   Chronic heart failure with preserved ejection fraction (HCC)   Diabetes mellitus without complication (HCC)   COPD with acute exacerbation (HCC)   Atrial flutter (HCC)   Hypokalemia   Cigarette smoker      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    Normal nuclear stress test 2011   Obesity    Obstructive sleep apnea on CPAP    Presence of indwelling urinary catheter    Shortness of breath    due to cigarette abuse   Tobacco abuse     Past Surgical History:  Procedure Laterality Date   ABDOMINAL AORTIC ENDOVASCULAR FENESTRATED STENT GRAFT N/A 04/09/2016   Procedure: ABDOMINAL AORTIC ENDOVASCULAR FENESTRATED STENT GRAFT with left brachial artery access using ultrasound guidance;  Surgeon: Josetta Niece  Charlotte Cookey, MD;  Location: [facility] OR;  Service: Vascular;  Laterality: N/A;   CHOLECYSTECTOMY  1997   [facility]   COLONOSCOPY  2006   2006: multiple diminutive polyps in rectum, one pedunculated polyp at 10 cm s/p snare removal. Left-sided diverticulum, pedunculated polyps at 35 cm and splenic flexure s/p snare removal. No path available.    COLONOSCOPY WITH PROPOFOL  N/A 11/24/2019   Procedure: COLONOSCOPY WITH PROPOFOL ;  Surgeon: Suzette Espy, MD;  Location: [facility];  Service: Endoscopy;  Laterality: N/A;  9:45am   CORONARY/GRAFT ACUTE MI REVASCULARIZATION N/A 08/04/2023   Procedure: Coronary/Graft Acute MI Revascularization;  Surgeon: Kyra Phy, MD;  Location: [facility] INVASIVE CV LAB;  Service: Cardiovascular;  Laterality: N/A;    PERIPHERAL VASCULAR CATHETERIZATION N/A 04/10/2016   Procedure: Renal Angiography;  Surgeon: Dannis Dy, MD;  Location: [facility] INVASIVE CV LAB;  Service: Cardiovascular;  Laterality: N/A;   POLYPECTOMY  11/24/2019   Procedure: POLYPECTOMY;  Surgeon: Suzette Espy, MD;  Location: [facility];  Service: Endoscopy;;   TRANSURETHRAL RESECTION OF PROSTATE  01/07/2011   Procedure: TRANSURETHRAL RESECTION OF THE PROSTATE (TURP);  Surgeon: Mohammad I Javaid;  Location: [facility] ORS;  Service: Urology;  Laterality: N/A;       HPI  from the history and physical done on the day of admission:    HPI: Kevin Frank is a 83 y.o. male with medical history significant of COPD with continued tobacco abuse, type 2 diabetes, BPH, hypertension, obstructive sleep apnea, AAA.  Patient was noted to be hypoxic yesterday and this morning with pulse ox in the 80s.  The patient has been coughing.  No fevers, chills, nausea, vomiting.  Patient was brought here to the hospital for evaluation due to the continued hypoxia.  Of note, the patient has been having difficulty with his memory.  He does not have a formal diagnosis of dementia, although his PCP started him on memantine .   Review of Systems: As mentioned in the history of present illness. All other systems reviewed and are negative.   Hospital Course:    Brief Narrative:  83 y.o. male with medical history significant of COPD with continued tobacco abuse, type 2 diabetes, BPH, hypertension, obstructive sleep apnea, AAA and dementia admitted on 10/23/2023 with acute on chronic hypoxic respiratory failure secondary to COPD exacerbation     -Assessment and Plan: 1)Acute COPD Exacerbation-----in a patient with ongoing tobacco use -No definite pneumonia - Improved after treatment with IV Solu-Medrol , mucolytics, bronchodilators --added azithromycin  - Hypoxia, cough,  and wheezing improved -No significant dyspnea at rest , overall no significant dyspnea on  exertion  -Discharge on prednisone  and azithromycin    2)Tobacco abuse ---patient smokes about 2 packs of cigarettes per day -Continue nicotine  patch   3) acute on chronic hypoxic respiratory failure--- due to #1 above  --prior to admission patient will use oxygen  nightly with his CPAP - Patient has been weaned down to 3 L of oxygen  via nasal cannula continuously at this time -Respiratory status has improved overall   4)PAFib/Atrial Flutter--apixaban  for stroke prophylaxis.  Metoprolol  for rate control   5)DM2-A1c 8.4 reflecting uncontrolled DM with hyperglycemia PTA -Glycemic control should improve with steroid and taper - Resume metformin    6)Class 1 Obesity/OSA -Low calorie diet, portion control and increase physical activity discussed with patient -Body mass index is 32.14 kg/m. Cpap at bedtime with 5 L/min of oxygen  being bled in   7)BPH--continue tamsulosin    8)HTN--continue losartan    9)Dementia--- continue  Namenda    10)H/o CAD --- prior NSTEMI  -- asymptomatic, chest pain-free -Continue Plavix ,  metoprolol  and atorvastatin  for secondary prevention -Patient should discuss with PCP and primary cardiologist if Plavix  can be discontinued given that patient needs to be on apixaban  for A-fib   Disposition: The patient is from: Home              Anticipated d/c is to: Home  Discharge Condition: Stable  Follow UP--PCP as advised  Diet and Activity recommendation:  As advised  Discharge Instructions    Discharge Instructions     Call MD for:  difficulty breathing, headache or visual disturbances   Complete by: As directed    Call MD for:  persistant dizziness or light-headedness   Complete by: As directed    Call MD for:  persistant nausea and vomiting   Complete by: As directed    Call MD for:  temperature >100.4   Complete by: As directed    Diet - low sodium heart healthy   Complete by: As directed    Discharge instructions   Complete by: As directed     1)Avoid ibuprofen /Advil /Aleve/Motrin Juluis Ok Powders/Naproxen/BC powders/Meloxicam /Diclofenac /Indomethacin and other Nonsteroidal anti-inflammatory medications as these will make you more likely to bleed and can cause stomach ulcers, can also cause Kidney problems.   2)Watch for bleeding while on Blood Thinners--watch for blood in your stool which can make your stool black, maroon, mahogany or red---, blood in your urine which can make your urine pink or red, nosebleeds , also watch for possible bruising -You are taking Apixaban /Eliquis  and Clopidogrel /Plavix --- which are blood thinners--- be careful to avoid injury or falls  3)Patient needs continuous O2 at 3 L/min continuously via nasal cannula with humidifier while awake, he needs Oxygen  at 5L/min "bleeding" into his CPAP while asleep, with gaseous portability and conserving device   -You need oxygen  at home via nasal cannula continuously while awake and while asleep as outlined above--- smoking or having open fires around oxygen  can cause fire, significant injury and death.  4)Complete abstinence from tobacco advised--- okay to use over-the-counter nicotine  patch to help you quit smoking   For home use only DME Nebulizer machine   Complete by: As directed    Patient needs a nebulizer to treat with the following condition: COPD (chronic obstructive pulmonary disease) (HCC)   Length of Need: Lifetime   Additional equipment included:  Administration kit Filter     Increase activity slowly   Complete by: As directed         Discharge Medications     Allergies as of 10/26/2023       Reactions   Amiodarone  Hives, Rash        Medication List     TAKE these medications    albuterol  (2.5 MG/3ML) 0.083% nebulizer solution Commonly known as: PROVENTIL  Take 3 mLs (2.5 mg total) by nebulization every 4 (four) hours as needed for wheezing or shortness of breath. What changed: when to take this   albuterol  108 (90 Base) MCG/ACT  inhaler Commonly known as: VENTOLIN  HFA Inhale 2 puffs into the lungs every 4 (four) hours as needed for wheezing or shortness of breath (cough). What changed:  how much to take how to take this when to take this reasons to take this additional instructions   allopurinol  300 MG tablet Commonly known as: ZYLOPRIM  Take 1 tablet (300 mg total) by mouth daily.   apixaban  5 MG Tabs tablet Commonly known as: ELIQUIS  Take 1 tablet (  5 mg total) by mouth 2 (two) times daily.   atorvastatin  80 MG tablet Commonly known as: LIPITOR Take 1 tablet (80 mg total) by mouth daily.   azithromycin  500 MG tablet Commonly known as: ZITHROMAX  Take 1 tablet (500 mg total) by mouth daily for 3 days.   clopidogrel  75 MG tablet Commonly known as: PLAVIX  Take 1 tablet (75 mg total) by mouth daily.   EPINEPHrine  0.3 mg/0.3 mL Soaj injection Commonly known as: EPI-PEN Inject 0.3 mg into the muscle as needed for anaphylaxis.   famotidine  20 MG tablet Commonly known as: PEPCID  Take 1 tablet (20 mg total) by mouth 2 (two) times daily.   guaiFENesin  600 MG 12 hr tablet Commonly known as: Mucinex  Take 1 tablet (600 mg total) by mouth 2 (two) times daily.   losartan  50 MG tablet Commonly known as: COZAAR  Take 1 tablet (50 mg total) by mouth daily. Start taking on: Oct 27, 2023 What changed:  medication strength how much to take   memantine  5 MG tablet Commonly known as: NAMENDA  Take 5 mg by mouth 2 (two) times daily.   metFORMIN  500 MG tablet Commonly known as: GLUCOPHAGE  Take 2 tablets (1,000 mg total) by mouth 2 (two) times daily.   metoprolol  succinate 50 MG 24 hr tablet Commonly known as: TOPROL -XL Take 1 tablet (50 mg total) by mouth 2 (two) times daily. Take with or immediately following a meal. What changed: when to take this   Neuriva Plus Caps Take 1 tablet by mouth daily.   nicotine  21 mg/24hr patch Commonly known as: NICODERM CQ  - dosed in mg/24 hours Place 1 patch (21 mg  total) onto the skin daily. Start taking on: Oct 27, 2023   nitroGLYCERIN  0.4 MG SL tablet Commonly known as: NITROSTAT  Place 1 tablet (0.4 mg total) under the tongue every 5 (five) minutes as needed for chest pain.   potassium chloride  10 MEQ tablet Commonly known as: KLOR-CON  Take 1 tablet (10 mEq total) by mouth daily. Take While taking Demadex /Torsemide    predniSONE  20 MG tablet Commonly known as: DELTASONE  Take 2 tablets (40 mg total) by mouth daily with breakfast for 5 days.   sertraline  50 MG tablet Commonly known as: ZOLOFT  Take 1 tablet (50 mg total) by mouth daily. What changed: how much to take   tamsulosin  0.4 MG Caps capsule Commonly known as: FLOMAX  Take 1 capsule (0.4 mg total) by mouth daily after supper.   torsemide  20 MG tablet Commonly known as: DEMADEX  Take 1 tablet (20 mg total) by mouth daily.   Trelegy Ellipta  100-62.5-25 MCG/ACT Aepb Generic drug: Fluticasone -Umeclidin-Vilant Inhale 1 puff into the lungs daily.               Durable Medical Equipment  (From admission, onward)           Start     Ordered   10/26/23 1032  For home use only DME oxygen   Once       Comments: SATURATION QUALIFICATIONS: (This note is used to comply with regulatory documentation for home oxygen )   Patient Saturations on Room Air at Rest = 85 %   Patient Saturations on Room Air while Ambulating = 82 %   Patient Saturations on 3 Liters of oxygen  while Ambulating = 92 %    Patient needs continuous O2 at 3 L/min continuously via nasal cannula with humidifier while awake, he needs Oxygen  at 5L/min "bleeding" into his CPAP while asleep, with gaseous portability and conserving device  Question  Answer Comment  Length of Need Lifetime   Mode or (Route) Nasal cannula   Liters per Minute 5   Frequency Continuous (stationary and portable oxygen  unit needed)   Oxygen  conserving device Yes   Oxygen  delivery system Gas      10/26/23 1031   10/26/23 0000  For home use  only DME Nebulizer machine       Question Answer Comment  Patient needs a nebulizer to treat with the following condition COPD (chronic obstructive pulmonary disease) (HCC)   Length of Need Lifetime   Additional equipment included Administration kit   Additional equipment included Filter      10/26/23 1104            Major procedures and Radiology Reports - PLEASE review detailed and final reports for all details, in brief -   DG Chest Port 1 View Result Date: 10/23/2023 CLINICAL DATA:  Shortness of breath, weakness and cough EXAM: PORTABLE CHEST 1 VIEW COMPARISON:  08/07/2023 FINDINGS: Atherosclerotic calcification of the aortic arch. Mild enlargement of the cardiopericardial silhouette Apical lordotic projection.  Centrilobular emphysema. Mild interstitial accentuation, as can commonly be encountered in smokers. The lungs appear otherwise clear. IMPRESSION: 1. Mild enlargement of the cardiopericardial silhouette. 2. Aortic Atherosclerosis (ICD10-I70.0) and Emphysema (ICD10-J43.9). Electronically Signed   By: Freida Jes M.D.   On: 10/23/2023 14:10   Micro Results  Recent Results (from the past 240 hours)  Resp panel by RT-PCR (RSV, Flu A&B, Covid) Anterior Nasal Swab     Status: None   Collection Time: 10/24/23  3:44 PM   Specimen: Anterior Nasal Swab  Result Value Ref Range Status   SARS Coronavirus 2 by RT PCR NEGATIVE NEGATIVE Final    Comment: (NOTE) SARS-CoV-2 target nucleic acids are NOT DETECTED.  The SARS-CoV-2 RNA is generally detectable in upper respiratory specimens during the acute phase of infection. The lowest concentration of SARS-CoV-2 viral copies this assay can detect is 138 copies/mL. A negative result does not preclude SARS-Cov-2 infection and should not be used as the sole basis for treatment or other patient management decisions. A negative result may occur with  improper specimen collection/handling, submission of specimen other than nasopharyngeal  swab, presence of viral mutation(s) within the areas targeted by this assay, and inadequate number of viral copies(<138 copies/mL). A negative result must be combined with clinical observations, patient history, and epidemiological information. The expected result is Negative.  Fact Sheet for Patients:  BloggerCourse.com  Fact Sheet for Healthcare Providers:  SeriousBroker.it  This test is no t yet approved or cleared by the [place]  FDA and  has been authorized for detection and/or diagnosis of SARS-CoV-2 by FDA under an Emergency Use Authorization (EUA). This EUA will remain  in effect (meaning this test can be used) for the duration of the COVID-19 declaration under Section 564(b)(1) of the Act, 21 U.S.C.section 360bbb-3(b)(1), unless the authorization is terminated  or revoked sooner.       Influenza A by PCR NEGATIVE NEGATIVE Final   Influenza B by PCR NEGATIVE NEGATIVE Final    Comment: (NOTE) The Xpert Xpress SARS-CoV-2/FLU/RSV plus assay is intended as an aid in the diagnosis of influenza from Nasopharyngeal swab specimens and should not be used as a sole basis for treatment. Nasal washings and aspirates are unacceptable for Xpert Xpress SARS-CoV-2/FLU/RSV testing.  Fact Sheet for Patients: BloggerCourse.com  Fact Sheet for Healthcare Providers: SeriousBroker.it  This test is not yet approved or cleared by the [place]  FDA and  has been authorized for detection and/or diagnosis of SARS-CoV-2 by FDA under an Emergency Use Authorization (EUA). This EUA will remain in effect (meaning this test can be used) for the duration of the COVID-19 declaration under Section 564(b)(1) of the Act, 21 U.S.C. section 360bbb-3(b)(1), unless the authorization is terminated or revoked.     Resp Syncytial Virus by PCR NEGATIVE NEGATIVE Final    Comment: (NOTE) Fact Sheet for  Patients: BloggerCourse.com  Fact Sheet for Healthcare Providers: SeriousBroker.it  This test is not yet approved or cleared by the [place]  FDA and has been authorized for detection and/or diagnosis of SARS-CoV-2 by FDA under an Emergency Use Authorization (EUA). This EUA will remain in effect (meaning this test can be used) for the duration of the COVID-19 declaration under Section 564(b)(1) of the Act, 21 U.S.C. section 360bbb-3(b)(1), unless the authorization is terminated or revoked.  Performed at [facility], [address] [place], [place] [address]     Today   Subjective    Kevin Frank today has no new complaints No fever  Or chills  - No dyspnea at rest  - Cough and wheezing has improved significantly - Daytime oxygen  requirement improving   Patient has been seen and examined prior to discharge   Objective   Blood pressure (!) 183/94, pulse 87, temperature 97.9 F (36.6 C), temperature source Oral, resp. rate 20, height 5\' 10"  (1.778 m), weight 101.6 kg, SpO2 93%.   Intake/Output Summary (Last 24 hours) at 10/26/2023 1105 Last data filed at 10/26/2023 0923 Gross per 24 hour  Intake 720 ml  Output 450 ml  Net 270 ml    Exam Gen:- Awake Alert, no acute distress  HEENT:- [place].AT, No sclera icterus Nose- [place] 3L/min Neck-Supple Neck,No JVD,.  Lungs-  -improved air movement overall, no significant wheezing CV- S1, S2 normal, irregular Abd-  +ve B.Sounds, Abd Soft, No tenderness,    Extremity/Skin:- No  edema,   good pulses Psych-affect is appropriate, oriented x3 Neuro-no new focal deficits, no tremors    Data Review   CBC w Diff:  Lab Results  Component Value Date   WBC 13.9 (H) 10/23/2023   HGB 14.8 10/23/2023   HCT 44.1 10/23/2023   PLT 158 10/23/2023   LYMPHOPCT 16 08/11/2023   MONOPCT 8 08/11/2023   EOSPCT 1 08/11/2023   BASOPCT 1 08/11/2023    CMP:  Lab Results  Component Value Date    NA 142 10/24/2023   NA 141 03/18/2017   K 3.6 10/24/2023   CL 105 10/24/2023   CO2 24 10/24/2023   BUN 26 (H) 10/24/2023   BUN 21 03/23/2019   CREATININE 1.09 10/24/2023   CREATININE 0.98 02/08/2016   GLU 124 03/23/2019   PROT 6.5 08/08/2023   ALBUMIN 3.0 (L) 08/08/2023   BILITOT 0.7 08/08/2023   ALKPHOS 59 08/08/2023   AST 18 08/08/2023   ALT 24 08/08/2023  .  Total Discharge time is about 33 minutes  Colin Dawley M.D on 10/26/2023 at 11:05 AM  Go to [email or web address] -  for contact info  [facility] - Office  [phone]

## 2023-10-26 NOTE — Plan of Care (Signed)
  Problem: Education: Goal: Knowledge of General Education information will improve Description: Including pain rating scale, medication(s)/side effects and non-pharmacologic comfort measures Outcome: Progressing   Problem: Clinical Measurements: Goal: Respiratory complications will improve Outcome: Progressing   Problem: Activity: Goal: Ability to tolerate increased activity will improve Outcome: Progressing   Problem: Respiratory: Goal: Levels of oxygenation will improve Outcome: Progressing

## 2023-10-26 NOTE — Progress Notes (Signed)
    SATURATION QUALIFICATIONS: (This note is used to comply with regulatory documentation for home oxygen )   Patient Saturations on Room Air at Rest = 85 %   Patient Saturations on Room Air while Ambulating = 82 %   Patient Saturations on 3 Liters of oxygen  while Ambulating = 92 %    Patient needs continuous O2 at 3 L/min continuously via nasal cannula with humidifier while awake, he needs Oxygen  at 5L/min "bleeding" into his CPAP while asleep, with gaseous portability and conserving device    Colin Dawley, MD

## 2023-10-28 ENCOUNTER — Ambulatory Visit

## 2023-10-28 DIAGNOSIS — J4550 Severe persistent asthma, uncomplicated: Secondary | ICD-10-CM | POA: Diagnosis not present

## 2023-10-29 DIAGNOSIS — H43812 Vitreous degeneration, left eye: Secondary | ICD-10-CM | POA: Diagnosis not present

## 2023-10-29 DIAGNOSIS — M069 Rheumatoid arthritis, unspecified: Secondary | ICD-10-CM | POA: Diagnosis not present

## 2023-10-30 ENCOUNTER — Ambulatory Visit: Payer: Medicare HMO | Admitting: Allergy & Immunology

## 2023-11-02 ENCOUNTER — Telehealth: Payer: Self-pay | Admitting: Allergy & Immunology

## 2023-11-02 ENCOUNTER — Telehealth: Payer: Self-pay | Admitting: *Deleted

## 2023-11-02 NOTE — Telephone Encounter (Signed)
 [facility], called to advise convo with pt/pt wife, who indicated only using oxygenator during the day when <90. Would like clarification on usage prior to visit. PT also expressed interest in obtaining portable oxygen  concentrator if needed, can discuss at visit or on phone.

## 2023-11-02 NOTE — Telephone Encounter (Signed)
 L/m for patient wife again regarding ordering his Tezspire  from [employer] the number is [phone] opt 2 opt 3

## 2023-11-02 NOTE — Telephone Encounter (Signed)
 Called and spoke to patient and his wife and informed them to contact PCP as Dr. Idolina Maker doesn't deal with oxygen  nor the orders. Both verbalized understanding.

## 2023-11-06 DIAGNOSIS — I10 Essential (primary) hypertension: Secondary | ICD-10-CM | POA: Diagnosis not present

## 2023-11-06 DIAGNOSIS — I5032 Chronic diastolic (congestive) heart failure: Secondary | ICD-10-CM | POA: Diagnosis not present

## 2023-11-06 DIAGNOSIS — J441 Chronic obstructive pulmonary disease with (acute) exacerbation: Secondary | ICD-10-CM | POA: Diagnosis not present

## 2023-11-06 DIAGNOSIS — J449 Chronic obstructive pulmonary disease, unspecified: Secondary | ICD-10-CM | POA: Diagnosis not present

## 2023-11-06 DIAGNOSIS — I110 Hypertensive heart disease with heart failure: Secondary | ICD-10-CM | POA: Diagnosis not present

## 2023-11-06 DIAGNOSIS — E1165 Type 2 diabetes mellitus with hyperglycemia: Secondary | ICD-10-CM | POA: Diagnosis not present

## 2023-11-06 DIAGNOSIS — R269 Unspecified abnormalities of gait and mobility: Secondary | ICD-10-CM | POA: Diagnosis not present

## 2023-11-06 DIAGNOSIS — I5022 Chronic systolic (congestive) heart failure: Secondary | ICD-10-CM | POA: Diagnosis not present

## 2023-11-06 DIAGNOSIS — E1159 Type 2 diabetes mellitus with other circulatory complications: Secondary | ICD-10-CM | POA: Diagnosis not present

## 2023-11-09 ENCOUNTER — Telehealth: Payer: Self-pay | Admitting: Neurology

## 2023-11-09 ENCOUNTER — Ambulatory Visit: Admitting: Nurse Practitioner

## 2023-11-09 NOTE — Telephone Encounter (Signed)
 Wife called to schedule New Pt appointment

## 2023-11-20 ENCOUNTER — Encounter: Payer: Self-pay | Admitting: Nurse Practitioner

## 2023-11-20 ENCOUNTER — Ambulatory Visit: Attending: Nurse Practitioner | Admitting: Nurse Practitioner

## 2023-11-20 VITALS — BP 142/73 | HR 72 | Ht 70.0 in | Wt 225.0 lb

## 2023-11-20 DIAGNOSIS — I10 Essential (primary) hypertension: Secondary | ICD-10-CM

## 2023-11-20 DIAGNOSIS — Z720 Tobacco use: Secondary | ICD-10-CM

## 2023-11-20 DIAGNOSIS — I480 Paroxysmal atrial fibrillation: Secondary | ICD-10-CM | POA: Diagnosis not present

## 2023-11-20 DIAGNOSIS — E785 Hyperlipidemia, unspecified: Secondary | ICD-10-CM

## 2023-11-20 DIAGNOSIS — J4489 Other specified chronic obstructive pulmonary disease: Secondary | ICD-10-CM

## 2023-11-20 DIAGNOSIS — Z9889 Other specified postprocedural states: Secondary | ICD-10-CM | POA: Diagnosis not present

## 2023-11-20 DIAGNOSIS — R0609 Other forms of dyspnea: Secondary | ICD-10-CM

## 2023-11-20 DIAGNOSIS — I2510 Atherosclerotic heart disease of native coronary artery without angina pectoris: Secondary | ICD-10-CM | POA: Diagnosis not present

## 2023-11-20 DIAGNOSIS — J449 Chronic obstructive pulmonary disease, unspecified: Secondary | ICD-10-CM

## 2023-11-20 DIAGNOSIS — G4733 Obstructive sleep apnea (adult) (pediatric): Secondary | ICD-10-CM

## 2023-11-20 DIAGNOSIS — I5032 Chronic diastolic (congestive) heart failure: Secondary | ICD-10-CM | POA: Diagnosis not present

## 2023-11-20 DIAGNOSIS — C3491 Malignant neoplasm of unspecified part of right bronchus or lung: Secondary | ICD-10-CM

## 2023-11-20 DIAGNOSIS — Z8679 Personal history of other diseases of the circulatory system: Secondary | ICD-10-CM

## 2023-11-20 MED ORDER — LOSARTAN POTASSIUM 50 MG PO TABS
75.0000 mg | ORAL_TABLET | Freq: Every day | ORAL | 5 refills | Status: DC
Start: 2023-11-20 — End: 2024-02-11

## 2023-11-20 NOTE — Progress Notes (Signed)
 Office Visit    Patient Name: Kevin Frank Date of Encounter: 11/20/2023 PCP:  [name], MD [facility]  Cardiologist:  Armida Lander, MD  Advanced Practice Provider:  Gabino Joe, PA-C Electrophysiologist:  None   Chief Complaint and HPI    Kevin Frank is a 83 y.o. male with a hx of CAD, aortic atherosclerosis, HFpEF, ascending aortic dilatation, hypertension, hyperlipidemia, type 2 diabetes, history of tobacco abuse, asthma/COPD, history of lung cancer, abdominal aortic aneurysm, and osteoarthritis, who presents today for scheduled 6-8 week follow-up.    Previous patient of Dr. Arlester Ladd.     Last seen by Dr. Armida Lander on April 22, 2023. Was overall doing well, however BP was elevated. Medication changed to Hydralazine  100 mg BID. Atorvastatin  was increased to 40 mg daily due to LDL above goal.    Hospital admission at [facility] 05/2023 due to sepsis secondary to PNA. Tx with ABX.   Hospitalized in February 2025 due to STEMI, emergently taken to cath lab. Underwent heart cath on 08/04/2023 and received successful complex 3 overlapping DES to mid RCA. Case complicated by transient bradycardia without significant hypotension, tx with DAPT.  Echo revealed normal LVEF, mild LVH, grade 2 DD, normal PASP, mild MR. Newly dx with A-flutter/A-fib this admission, started on Amiodarone , however could not tolerate (had rash and lip/facial swelling), with plan for focus on rate control. Started on Eliquis  5 mg BID, transitioned from Brilinta  to Plavix  for triple therapy. Was recommended could pursue DCCV after 4 weeks if uninterrupted OAC therapy and remained in A-fib. Hospital course also noted by UTI, SGLT2i stopped, did have some AMS/delirium d/t this.   Last seen by Marlyse Single, PA-C on August 19, 2023. Was overall doing well at the time, rash resolved. Was continuing to smoke. Stable weight. Was found to be ready to start cardiac rehab. Instructed to  stop Aspirin  30 days post MI. Remained in A-fib and was recommended to consider DCCV at next office visit if he remained in A-fib.   09/28/2023 - Today he presents for scheduled follow-up with his wife. Pt says he is doing well and denies any acute cardiac complaints or issues. Wife confirms he is using oxygen  at night (5 liters). His activity is limited per his wife's report, says he is getting his days and nights confused. Notes stable DOE. Wife says he has refused cardiac rehab. Weight is stable. Denies any chest pain, palpitations, syncope, presyncope, dizziness, orthopnea, PND, swelling or significant weight changes, acute bleeding, or claudication. Continues to smoke.    Hospitalized in early May 2025 due to COPD exacerbation and acute respiratory failure with hypoxia and acute on chronic hypoxic respiratory failure.  Received treatment via bronchodilators, IV Solu-Medrol , mucolytic's, no definitive pneumonia.  Today he presents for hospital follow-up.  He states he is doing well. Denies any acute cardiac complaints or issues. Wife wants to verify which dosage patient should be taking of his BP medications. Denies any chest pain, shortness of breath, palpitations, syncope, presyncope, dizziness, orthopnea, PND, swelling or significant weight changes, acute bleeding, or claudication.   EKGs/Labs/Other Studies Reviewed:   The following studies were reviewed today:   EKG:  EKG is not ordered today.      Echo 07/2023: 1. Left ventricular ejection fraction, by estimation, is 60 to 65%. The  left ventricle has normal function. The left ventricle has no regional  wall motion abnormalities. There is mild concentric left ventricular  hypertrophy. Left ventricular diastolic  parameters are consistent with Grade II diastolic dysfunction  (pseudonormalization). Elevated left ventricular end-diastolic pressure.   2. Right ventricular systolic function is normal. The right ventricular  size is normal.  There is normal pulmonary artery systolic pressure. The  estimated right ventricular systolic pressure is 30.8 mmHg.   3. The mitral valve is normal in structure. Mild mitral valve  regurgitation.   4. The aortic valve is tricuspid. Aortic valve regurgitation is not  visualized. Aortic valve sclerosis/calcification is present, without any  evidence of aortic stenosis.   5. The inferior vena cava is dilated in size with <50% respiratory  variability, suggesting right atrial pressure of 15 mmHg.   Comparison(s): Changes from prior study are noted. 11/15/2019: LVEF 60-65%,  dilated aorta to 41 mm.   Cardiac cath 07/2023:   Prox RCA to Mid RCA lesion is 99% stenosed.   A stent was successfully placed.   Post intervention, there is a 0% residual stenosis.   1.  99% mid right coronary artery lesion requiring complex PCI with multiple wires and GuideLiner with 3 overlapping drug-eluting stents.  The case was complicated by transient need for norepinephrine  and vasopressin .  The patient received bicarbonate and Lasix  10 mg IV during the procedure.  All pressors were weaned at the conclusion of the procedure. 2.  Mild left-sided disease. 3.  Significant upper extremity tortuosity requiring destination sheath. 4.  LVEDP of 25 mmHg at the conclusion of the procedure and after Lasix  had and administered. 5.  Due to inability to take p.o. during the procedure, the patient was treated with a cangrelor  drip.  Transition to Brilinta  will be managed by pharmacy.   Recommendation: Dual antiplatelet therapy for 1 year and aggressive treatment of cardiovascular risk factors.  Echo 10/2019:  1. Normal LV systolic function; mild LVE; mild LVH; grade 1 diastolic dysfunction; mildly dilated ascending aorta.   2. Left ventricular ejection fraction, by estimation, is 60 to 65%. The left ventricle has normal function. The left ventricle has no regional wall motion abnormalities. The left ventricular internal cavity  size was mildly dilated. There is mild left ventricular hypertrophy. Left ventricular diastolic parameters are consistent with Grade I diastolic dysfunction (impaired relaxation). Elevated left atrial pressure.   3. Right ventricular systolic function is normal. The right ventricular size is normal. There is mildly elevated pulmonary artery systolic pressure.   4. The mitral valve is normal in structure. Trivial mitral valve  regurgitation. No evidence of mitral stenosis.   5. The aortic valve is tricuspid. Aortic valve regurgitation is not  visualized. Mild aortic valve sclerosis is present, with no evidence of aortic valve stenosis.   6. Aortic dilatation noted. There is mild dilatation of the ascending aorta measuring 41 mm.   7. The inferior vena cava is normal in size with greater than 50%  respiratory variability, suggesting right atrial pressure of 3 mmHg.  Myoview  10/2019: Nuclear stress EF: 60%. The left ventricular ejection fraction is normal (55-65%). This is a low risk study. There is no evidence of ischemia and no evidence of previous myocardial infarction. The study is normal.  Review of Systems    All other systems reviewed and are otherwise negative except as noted above.  Physical Exam    VS:  BP (!) 142/73   Pulse 72   Ht 5\' 10"  (1.778 m)   Wt 225 lb (102.1 kg)   SpO2 90%   BMI 32.28 kg/m  , BMI Body mass index is 32.28 kg/m.  Wt Readings from Last 3 Encounters:  11/20/23 225 lb (102.1 kg)  10/23/23 224 lb (101.6 kg)  09/28/23 224 lb (101.6 kg)     GEN: Obese, 83 y.o. male in no acute distress. HEENT: normal. Neck: Supple, no JVD, carotid bruits, or masses. Cardiac: S1/S2, irregular rhythm and regular rate, no murmurs, rubs, or gallops. No clubbing, cyanosis, edema.  Radials/PT 2+ and equal bilaterally.  Respiratory:  Respirations regular and unlabored, clear to auscultation bilaterally, with scattered coarse breath sounds MS: No deformity or atrophy. Skin:  Warm and dry, no rash. Neuro:  Strength and sensation are intact. Psych: Normal affect.  Assessment & Plan    Chronic HFpEF Stage C, NYHA class II symptoms. Echo 07/2023 revealed EF normal. Euvolemic and well compensated on exam. Stable DOE, stable over time per patient's report. Etiology multifactorial. Will increase Losartan  to 75 mg daily and repeat BMET in 2 weeks. Continue rest of medication regimen.  Low sodium diet, fluid restriction <2L, and daily weights encouraged. Avoid SGLT2i - caused a UTI. Educated to contact our office for weight gain of 2 lbs overnight or 5 lbs in one week.   CAD, s/p inferior STEMI (s/p 3 overlapping DES to RCA in 07/2023) Stable with no anginal symptoms. No indication for ischemic evaluation. No longer on Aspirin . Continue Plavix , Toprol  XL, atorvastatin , and NTG PRN. Increasing losartan  as noted above. Heart healthy diet and regular cardiovascular exercise encouraged.  3. PAF Denies any tachycardia or palpitations. Continue Eliquis  for stroke prevention. Continue Toprol  XL for HR control. Agree to focus on rate control strategy. Could not tolerate Amiodarone  - now listed as allergy. No indication for DCCV at this time as he has PAF. No medication changes at this time.   AAA, s/p repair  Last duplex with VVS showed unchanged aortic diameter. Follow-up with Dr. Charlotte Cookey with vascular surgery. Care and ED precautions discussed.   HTN Blood pressure mildly elevated today - increasing Losartan  as noted above. Will obtain BMET in 1-2 weeks.  No other medication changes at this time. Discussed to monitor BP at home at least 2 hours after medications and sitting for 5-10 minutes. Heart healthy diet and regular cardiovascular exercise encouraged. Notified when to contact our office.   HLD LDL 34 07/2023. Continue atorvastatin . Heart healthy diet and regular cardiovascular exercise encouraged. At next visit, plan to obtain FLP and CMET.   Tobacco abuse, COPD, DOE, lung  cancer Smoking cessation encouraged and discussed. DOE most likely d/t COPD and tobacco abuse. Continue to follow with PCP and Oncology. Will place referral for pulmonology.   Disposition: Follow up in 3 months with Armida Lander, MD or APP.  Signed, Lasalle Pointer, NP

## 2023-11-20 NOTE — Patient Instructions (Addendum)
 Medication Instructions:  Please Increase Losartan  to 75 Mg daily   Labwork: In 2 weeks at [facility]   Testing/Procedures: None   Follow-Up: Your physician recommends that you schedule a follow-up appointment in: 3 Months   Any Other Special Instructions Will Be Listed Below (If Applicable).  If you need a refill on your cardiac medications before your next appointment, please call your pharmacy.

## 2023-11-23 ENCOUNTER — Ambulatory Visit

## 2023-11-25 ENCOUNTER — Ambulatory Visit (INDEPENDENT_AMBULATORY_CARE_PROVIDER_SITE_OTHER): Admitting: Allergy & Immunology

## 2023-11-25 ENCOUNTER — Other Ambulatory Visit: Payer: Self-pay

## 2023-11-25 ENCOUNTER — Encounter: Payer: Self-pay | Admitting: Allergy & Immunology

## 2023-11-25 ENCOUNTER — Encounter

## 2023-11-25 VITALS — BP 120/70 | HR 80 | Temp 98.3°F | Resp 16 | Ht 67.32 in | Wt 222.4 lb

## 2023-11-25 DIAGNOSIS — J3089 Other allergic rhinitis: Secondary | ICD-10-CM | POA: Diagnosis not present

## 2023-11-25 DIAGNOSIS — F17200 Nicotine dependence, unspecified, uncomplicated: Secondary | ICD-10-CM

## 2023-11-25 DIAGNOSIS — R918 Other nonspecific abnormal finding of lung field: Secondary | ICD-10-CM | POA: Diagnosis not present

## 2023-11-25 DIAGNOSIS — J4550 Severe persistent asthma, uncomplicated: Secondary | ICD-10-CM

## 2023-11-25 DIAGNOSIS — J302 Other seasonal allergic rhinitis: Secondary | ICD-10-CM

## 2023-11-25 NOTE — Progress Notes (Signed)
 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 Asthma/COPD overlap syndrome - Breathing test looks stable today.  - I think we are in a good spot!  - Continue with your Trelegy one puff once daily + Tezspire  every 28 days  - May use albuterol  2 puffs every 4 hours as needed for cough, wheeze, tightness in chest, shortness of breath OR may use albuterol  via nebulizer 1 ampoule every 4 hours as needed for cough, wheeze, tightness in chest, or shortness of breath. Asthma control goals:  Full participation in all desired activities (may need albuterol  before activity) Albuterol  use two time or less a week on average (not counting use with activity) Cough interfering with sleep two time or less a month Oral steroids no more than once a year No hospitalizations  2. Seasonal and perennial allergic rhinitis - May use an antihistamine such as Claritin , Allegra, Xyzal, or Zyrtec once a day as needed for runny nose  3. Return in about 6 months (around 05/26/2024).   Subjective:   Kevin Frank is a 83 y.o. male presenting today for follow up of  Chief Complaint  Patient presents with   Establish Care   Allergic Rhinitis     Kevin Frank has a history of the following: Patient Active Problem List   Diagnosis Date Noted   Acute respiratory failure with hypoxia (HCC) 10/23/2023   Urinary retention 08/11/2023   Chronic heart failure with preserved ejection fraction (HFpEF) (HCC) 08/11/2023   S/P right coronary artery (RCA) stent placement 08/10/2023   Urinary tract infection without hematuria  08/10/2023   Atrial flutter (HCC) 08/10/2023   Paroxysmal atrial fibrillation (HCC) 08/10/2023   Transient alteration of awareness 08/10/2023   Hypokalemia 08/10/2023   Cigarette smoker 08/10/2023   Atherosclerosis of native coronary artery of native heart without angina pectoris 08/10/2023   STEMI (ST elevation myocardial infarction) (HCC) 08/04/2023   Chronic gouty arthritis 07/14/2022   Polyarthralgia 07/14/2022   Coronary artery calcification 12/09/2021   COPD with acute exacerbation (HCC) 08/18/2021   Cough 07/18/2021   Asthma with COPD with exacerbation (HCC) 07/18/2021   Lobar pneumonia (HCC) 12/11/2020   Acute respiratory failure (HCC) 12/10/2020   Wrist pain, acute, right 09/07/2019   Right forearm pain 09/07/2019   Uncontrolled type 2 diabetes mellitus with hyperglycemia (HCC) 06/27/2019   Mixed hyperlipidemia 06/27/2019   Class 1 obesity due to excess calories with serious comorbidity and body mass index (BMI) of 32.0 to 32.9 in adult 06/27/2019   Diabetes mellitus without complication (HCC) 06/22/2019   Positive colorectal cancer screening using Cologuard test 06/14/2019   Squamous cell carcinoma of right lung (HCC) 03/29/2018   Seasonal and perennial allergic rhinitis 09/22/2017   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   Obstructive sleep apnea on CPAP 04/23/2017   Chronic knee pain 04/23/2017   AKI (acute kidney injury) (HCC) 04/23/2017   Chronic heart failure with preserved ejection fraction (HCC) 04/23/2017   Cellulitis 04/23/2017   Cellulitis of left lower leg    Current smoker 03/05/2016   Incidental lung nodule, > 3mm and < 8mm 03/05/2016  Stage 2 moderate COPD by GOLD classification (HCC) 03/05/2016   AAA (abdominal aortic aneurysm) without rupture (HCC) 06/14/2015   Tobacco use 06/09/2007   OBSTRUCTIVE SLEEP APNEA 06/09/2007   HYPERCHOLESTEROLEMIA 05/06/2007   GOUT 05/06/2007   DEPRESSION 05/06/2007   Essential hypertension 05/06/2007   Aneurysm  of abdominal vessel (HCC) 05/06/2007   BENIGN PROSTATIC HYPERTROPHY, HX OF 05/06/2007    History obtained from: chart review and patient.  Discussed the use of AI scribe software for clinical note transcription with the patient and/or guardian, who gave verbal consent to proceed.  Kevin Frank is a 83 y.o. male presenting for a follow up visit.  He was last seen in November 2024.  At that time, lung testing was much better.  We did not make any medication.  We continued with Trelegy 1 puff once daily and Tezspire  every 28 days.  For the seasonal and perennial allergic rhinitis, we continue with antihistamine as needed.  For the muscle weakness and fatigue, we recommended that he talk to his PCP about this.  We have done some workup in the past including some lab work which was already normal.  We did talk about doing a neurology referral due to muscle weakness.  But his wife is not interested in traveling to [facility] to see neurology.  He was admitted to the hospital in early May 2025.  This seems to be for a COPD exacerbation and respiratory failure.  He had a chest x-ray that was normal.  He did receive IV Solu-Medrol  as well as mucolytic's, bronchodilators, and azithromycin .  His respiratory status improved while he was hospitalized.  He was discharged on prednisone  and azithromycin .  He was continued on his Plavix .  He was discharged on oxygen  to be used with his CPAP machine.  He was continued on apixaban  for stroke prophylaxis and metoprolol  for rate control.  Since last visit, he has done fairly well. He currently feels better and states that he 'feels good'.  Asthma/Respiratory Symptom History: He uses Trelegy every morning for his respiratory condition and has an emergency inhaler for shortness of breath. He uses his albuterol  inhaler twice a week and does not frequently need to go to the pharmacy for refills. He remains on the Tezspire  monthly and has been compliant with this.  He has a history  of a heart attack, which he did not initially recognize as he did not experience chest pain. His son noticed something was wrong when he was tired while sitting under a car. No current chest pain and he feels his heart is 'good' now.  Allergic Rhinitis Symptom History: He remains on an antihistamine every day.  It is unclear if he is compliant with this.  Regardless, he has not needed any antibiotics for any sinus infections.  He overall is doing very well.  Otherwise, there have been no changes to his past medical history, surgical history, family history, or social history.    Review of systems otherwise negative other than that mentioned in the HPI.    Objective:   Blood pressure 120/70, pulse 80, temperature 98.3 F (36.8 C), temperature source Temporal, resp. rate 16, height 5' 7.32" (1.71 m), weight 222 lb 6.4 oz (100.9 kg), SpO2 94%. Body mass index is 34.5 kg/m.    Physical Exam Vitals reviewed.  Constitutional:      Appearance: He is well-developed.     Comments: Less interactive.  Still very deaf.   HENT:     Head: Normocephalic and atraumatic.  Right Ear: Tympanic membrane, ear canal and external ear normal.     Left Ear: Tympanic membrane, ear canal and external ear normal.     Nose: No nasal deformity, septal deviation, mucosal edema or rhinorrhea.     Right Turbinates: Enlarged, swollen and pale.     Left Turbinates: Enlarged, swollen and pale.     Right Sinus: No maxillary sinus tenderness or frontal sinus tenderness.     Left Sinus: No maxillary sinus tenderness or frontal sinus tenderness.     Comments: Dried rhinorrhea.  No epistaxis.     Mouth/Throat:     Lips: Pink.     Mouth: Mucous membranes are not pale and dry.     Pharynx: Uvula midline.     Comments: Mild cobblestoning. Eyes:     General: Lids are normal. Allergic shiner present.        Right eye: No discharge.        Left eye: No discharge.     Conjunctiva/sclera: Conjunctivae normal.      Right eye: Right conjunctiva is not injected. No chemosis.    Left eye: Left conjunctiva is not injected. No chemosis.    Pupils: Pupils are equal, round, and reactive to light.  Cardiovascular:     Rate and Rhythm: Normal rate and regular rhythm.     Heart sounds: Normal heart sounds.  Pulmonary:     Effort: Pulmonary effort is normal. No tachypnea, accessory muscle usage or respiratory distress.     Breath sounds: No wheezing, rhonchi or rales.     Comments: Coarse rhonchi throughout.  There is some wheezing noted at the bases.  Chest:     Chest wall: No tenderness.  Lymphadenopathy:     Cervical: No cervical adenopathy.  Skin:    General: Skin is warm.     Capillary Refill: Capillary refill takes less than 2 seconds.     Coloration: Skin is not pale.     Findings: No abrasion, erythema, petechiae or rash. Rash is not papular, urticarial or vesicular.  Neurological:     Mental Status: He is alert.  Psychiatric:        Behavior: Behavior is cooperative.      Diagnostic studies:    Spirometry: results abnormal (FEV1: 1.36/55%, FVC: 2.67/78%, FEV1/FVC: 52%).    Spirometry consistent with mild obstructive disease.   Allergy Studies: none       Drexel Gentles, MD  [facility] 

## 2023-11-25 NOTE — Patient Instructions (Addendum)
 1. Asthma/COPD overlap syndrome - Breathing test looks stable today.  - I think we are in a good spot!  - Continue with your Trelegy one puff once daily + Tezspire  every 28 days  - May use albuterol  2 puffs every 4 hours as needed for cough, wheeze, tightness in chest, shortness of breath OR may use albuterol  via nebulizer 1 ampoule every 4 hours as needed for cough, wheeze, tightness in chest, or shortness of breath. Asthma control goals:  Full participation in all desired activities (may need albuterol  before activity) Albuterol  use two time or less a week on average (not counting use with activity) Cough interfering with sleep two time or less a month Oral steroids no more than once a year No hospitalizations  2. Seasonal and perennial allergic rhinitis - May use an antihistamine such as Claritin , Allegra, Xyzal, or Zyrtec once a day as needed for runny nose  3. Return in about 6 months (around 05/26/2024).   Tell Kevin Frank to BE NICE TO YOU! DOCTOR'S ORDERS!      Please inform us  of any Emergency Department visits, hospitalizations, or changes in symptoms. Call us  before going to the ED for breathing or allergy symptoms since we might be able to fit you in for a sick visit. Feel free to contact us  anytime with any questions, problems, or concerns.  It was a pleasure to see you guys again today!   Websites that have reliable patient information: 1. American Academy of Asthma, Allergy, and Immunology: www.aaaai.org 2. Food Allergy Research and Education (FARE): foodallergy.org 3. Mothers of Asthmatics: http://www.asthmacommunitynetwork.org 4. American College of Allergy, Asthma, and Immunology: www.acaai.org      "Like" us  on Facebook and Instagram for our latest updates!      A healthy democracy works best when [employer] participate! Make sure you are registered to vote! If you have moved or changed any of your contact information, you will need to get this updated before  voting! Scan the QR codes below to learn more!

## 2023-11-30 NOTE — Addendum Note (Signed)
 Addended by: Anthon Baston A on: 11/30/2023 11:38 AM   Modules accepted: Orders

## 2023-12-07 DIAGNOSIS — J441 Chronic obstructive pulmonary disease with (acute) exacerbation: Secondary | ICD-10-CM | POA: Diagnosis not present

## 2023-12-07 DIAGNOSIS — R051 Acute cough: Secondary | ICD-10-CM | POA: Diagnosis not present

## 2023-12-21 ENCOUNTER — Ambulatory Visit (INDEPENDENT_AMBULATORY_CARE_PROVIDER_SITE_OTHER)

## 2023-12-21 DIAGNOSIS — J4550 Severe persistent asthma, uncomplicated: Secondary | ICD-10-CM

## 2023-12-29 ENCOUNTER — Telehealth: Payer: Self-pay | Admitting: Cardiology

## 2023-12-29 ENCOUNTER — Inpatient Hospital Stay (HOSPITAL_COMMUNITY)
Admission: EM | Admit: 2023-12-29 | Discharge: 2023-12-31 | DRG: 193 | Disposition: A | Discharge status: Home / Self Care | Attending: Internal Medicine | Admitting: Internal Medicine

## 2023-12-29 ENCOUNTER — Other Ambulatory Visit: Payer: Self-pay

## 2023-12-29 ENCOUNTER — Emergency Department (HOSPITAL_COMMUNITY)

## 2023-12-29 ENCOUNTER — Encounter (HOSPITAL_COMMUNITY): Payer: Self-pay | Admitting: Emergency Medicine

## 2023-12-29 DIAGNOSIS — Z7984 Long term (current) use of oral hypoglycemic drugs: Secondary | ICD-10-CM | POA: Diagnosis not present

## 2023-12-29 DIAGNOSIS — J9621 Acute and chronic respiratory failure with hypoxia: Secondary | ICD-10-CM | POA: Diagnosis not present

## 2023-12-29 DIAGNOSIS — I110 Hypertensive heart disease with heart failure: Secondary | ICD-10-CM | POA: Diagnosis present

## 2023-12-29 DIAGNOSIS — Z9079 Acquired absence of other genital organ(s): Secondary | ICD-10-CM

## 2023-12-29 DIAGNOSIS — J441 Chronic obstructive pulmonary disease with (acute) exacerbation: Secondary | ICD-10-CM | POA: Diagnosis not present

## 2023-12-29 DIAGNOSIS — Z833 Family history of diabetes mellitus: Secondary | ICD-10-CM

## 2023-12-29 DIAGNOSIS — I10 Essential (primary) hypertension: Secondary | ICD-10-CM | POA: Diagnosis not present

## 2023-12-29 DIAGNOSIS — Z807 Family history of other malignant neoplasms of lymphoid, hematopoietic and related tissues: Secondary | ICD-10-CM

## 2023-12-29 DIAGNOSIS — E1159 Type 2 diabetes mellitus with other circulatory complications: Secondary | ICD-10-CM | POA: Diagnosis present

## 2023-12-29 DIAGNOSIS — I2510 Atherosclerotic heart disease of native coronary artery without angina pectoris: Secondary | ICD-10-CM | POA: Diagnosis present

## 2023-12-29 DIAGNOSIS — Z810 Family history of intellectual disabilities: Secondary | ICD-10-CM

## 2023-12-29 DIAGNOSIS — Z825 Family history of asthma and other chronic lower respiratory diseases: Secondary | ICD-10-CM

## 2023-12-29 DIAGNOSIS — E66811 Obesity, class 1: Secondary | ICD-10-CM | POA: Diagnosis present

## 2023-12-29 DIAGNOSIS — N400 Enlarged prostate without lower urinary tract symptoms: Secondary | ICD-10-CM | POA: Diagnosis present

## 2023-12-29 DIAGNOSIS — I5032 Chronic diastolic (congestive) heart failure: Secondary | ICD-10-CM | POA: Diagnosis not present

## 2023-12-29 DIAGNOSIS — J440 Chronic obstructive pulmonary disease with acute lower respiratory infection: Secondary | ICD-10-CM | POA: Diagnosis not present

## 2023-12-29 DIAGNOSIS — Z7951 Long term (current) use of inhaled steroids: Secondary | ICD-10-CM

## 2023-12-29 DIAGNOSIS — R0789 Other chest pain: Secondary | ICD-10-CM | POA: Diagnosis not present

## 2023-12-29 DIAGNOSIS — Z79899 Other long term (current) drug therapy: Secondary | ICD-10-CM

## 2023-12-29 DIAGNOSIS — Z9049 Acquired absence of other specified parts of digestive tract: Secondary | ICD-10-CM | POA: Diagnosis not present

## 2023-12-29 DIAGNOSIS — G4733 Obstructive sleep apnea (adult) (pediatric): Secondary | ICD-10-CM | POA: Diagnosis present

## 2023-12-29 DIAGNOSIS — E7800 Pure hypercholesterolemia, unspecified: Secondary | ICD-10-CM | POA: Diagnosis present

## 2023-12-29 DIAGNOSIS — I480 Paroxysmal atrial fibrillation: Secondary | ICD-10-CM | POA: Diagnosis not present

## 2023-12-29 DIAGNOSIS — F17210 Nicotine dependence, cigarettes, uncomplicated: Secondary | ICD-10-CM | POA: Diagnosis present

## 2023-12-29 DIAGNOSIS — Z6832 Body mass index (BMI) 32.0-32.9, adult: Secondary | ICD-10-CM

## 2023-12-29 DIAGNOSIS — Z8249 Family history of ischemic heart disease and other diseases of the circulatory system: Secondary | ICD-10-CM | POA: Diagnosis not present

## 2023-12-29 DIAGNOSIS — Z1152 Encounter for screening for COVID-19: Secondary | ICD-10-CM | POA: Diagnosis not present

## 2023-12-29 DIAGNOSIS — Z7901 Long term (current) use of anticoagulants: Secondary | ICD-10-CM

## 2023-12-29 DIAGNOSIS — J189 Pneumonia, unspecified organism: Principal | ICD-10-CM | POA: Diagnosis present

## 2023-12-29 DIAGNOSIS — F0390 Unspecified dementia without behavioral disturbance: Secondary | ICD-10-CM | POA: Diagnosis present

## 2023-12-29 DIAGNOSIS — Z85118 Personal history of other malignant neoplasm of bronchus and lung: Secondary | ICD-10-CM

## 2023-12-29 DIAGNOSIS — E1165 Type 2 diabetes mellitus with hyperglycemia: Secondary | ICD-10-CM | POA: Diagnosis present

## 2023-12-29 DIAGNOSIS — Z7902 Long term (current) use of antithrombotics/antiplatelets: Secondary | ICD-10-CM

## 2023-12-29 DIAGNOSIS — Z9981 Dependence on supplemental oxygen: Secondary | ICD-10-CM

## 2023-12-29 DIAGNOSIS — Z7952 Long term (current) use of systemic steroids: Secondary | ICD-10-CM

## 2023-12-29 DIAGNOSIS — R079 Chest pain, unspecified: Secondary | ICD-10-CM | POA: Diagnosis not present

## 2023-12-29 DIAGNOSIS — H9190 Unspecified hearing loss, unspecified ear: Secondary | ICD-10-CM | POA: Diagnosis present

## 2023-12-29 LAB — BASIC METABOLIC PANEL WITH GFR
Anion gap: 11 (ref 5–15)
BUN: 18 mg/dL (ref 8–23)
CO2: 27 mmol/L (ref 22–32)
Calcium: 9.6 mg/dL (ref 8.9–10.3)
Chloride: 99 mmol/L (ref 98–111)
Creatinine, Ser: 0.97 mg/dL (ref 0.61–1.24)
GFR, Estimated: >60 mL/min (ref >60)
Glucose, Bld: 218 mg/dL — ABNORMAL HIGH (ref 70–99)
Potassium: 3.8 mmol/L (ref 3.5–5.1)
Sodium: 137 mmol/L (ref 135–145)

## 2023-12-29 LAB — CBC
HCT: 43.8 % (ref 39.0–52.0)
Hemoglobin: 15 g/dL (ref 13.0–17.0)
MCH: 32.1 pg (ref 26.0–34.0)
MCHC: 34.2 g/dL (ref 30.0–36.0)
MCV: 93.8 fL (ref 80.0–100.0)
Platelets: 186 K/uL (ref 150–400)
RBC: 4.67 MIL/uL (ref 4.22–5.81)
RDW: 15.7 % — ABNORMAL HIGH (ref 11.5–15.5)
WBC: 15.6 K/uL — ABNORMAL HIGH (ref 4.0–10.5)
nRBC: 0 % (ref 0.0–0.2)

## 2023-12-29 LAB — CBG MONITORING, ED: Glucose-Capillary: 215 mg/dL — ABNORMAL HIGH (ref 70–99)

## 2023-12-29 LAB — TROPONIN I (HIGH SENSITIVITY)
Troponin I (High Sensitivity): 26 ng/L — ABNORMAL HIGH (ref <18)
Troponin I (High Sensitivity): 29 ng/L — ABNORMAL HIGH (ref <18)

## 2023-12-29 LAB — RESP PANEL BY RT-PCR (RSV, FLU A&B, COVID)  RVPGX2
Influenza A by PCR: NEGATIVE
Influenza B by PCR: NEGATIVE
Resp Syncytial Virus by PCR: NEGATIVE
SARS Coronavirus 2 by RT PCR: NEGATIVE

## 2023-12-29 MED ORDER — ONDANSETRON HCL 4 MG/2ML IJ SOLN: 4.0000 mg | Freq: Four times a day (QID) | INTRAMUSCULAR | Status: DC | PRN

## 2023-12-29 MED ORDER — IPRATROPIUM BROMIDE 0.02 % IN SOLN
0.5000 mg | Freq: Once | RESPIRATORY_TRACT | Status: AC
  Administered 2023-12-29: 0.5 mg via RESPIRATORY_TRACT
  Filled 2023-12-29: qty 2.5

## 2023-12-29 MED ORDER — SODIUM CHLORIDE 0.9 % IV SOLN
2.0000 g | Freq: Once | INTRAVENOUS | Status: AC
  Administered 2023-12-29: 2 g via INTRAVENOUS
  Filled 2023-12-29: qty 20

## 2023-12-29 MED ORDER — FAMOTIDINE 20 MG PO TABS
20.0000 mg | ORAL_TABLET | Freq: Two times a day (BID) | ORAL | Status: DC
  Administered 2023-12-29 – 2023-12-31 (×4): 20 mg via ORAL
  Filled 2023-12-29 (×4): qty 1

## 2023-12-29 MED ORDER — AZITHROMYCIN 250 MG PO TABS
500.0000 mg | ORAL_TABLET | Freq: Every day | ORAL | Status: DC
Start: 2023-12-30 — End: 2024-01-03
  Administered 2023-12-30 – 2023-12-31 (×2): 500 mg via ORAL
  Filled 2023-12-29 (×2): qty 2

## 2023-12-29 MED ORDER — ACETAMINOPHEN 650 MG RE SUPP: 650.0000 mg | Freq: Four times a day (QID) | RECTAL | Status: DC | PRN

## 2023-12-29 MED ORDER — POTASSIUM CHLORIDE CRYS ER 10 MEQ PO TBCR
10.0000 meq | EXTENDED_RELEASE_TABLET | Freq: Every day | ORAL | Status: DC
  Administered 2023-12-30 – 2023-12-31 (×2): 10 meq via ORAL
  Filled 2023-12-29 (×4): qty 1

## 2023-12-29 MED ORDER — APIXABAN 5 MG PO TABS
5.0000 mg | ORAL_TABLET | Freq: Two times a day (BID) | ORAL | Status: DC
  Administered 2023-12-29 – 2023-12-31 (×4): 5 mg via ORAL
  Filled 2023-12-29 (×4): qty 1

## 2023-12-29 MED ORDER — SERTRALINE HCL 50 MG PO TABS
50.0000 mg | ORAL_TABLET | Freq: Every day | ORAL | Status: DC
  Administered 2023-12-30 – 2023-12-31 (×2): 50 mg via ORAL
  Filled 2023-12-29 (×2): qty 1

## 2023-12-29 MED ORDER — AMLODIPINE BESYLATE 5 MG PO TABS
10.0000 mg | ORAL_TABLET | Freq: Every day | ORAL | Status: DC
  Administered 2023-12-30 – 2023-12-31 (×2): 10 mg via ORAL
  Filled 2023-12-29 (×2): qty 2

## 2023-12-29 MED ORDER — ALLOPURINOL 300 MG PO TABS
300.0000 mg | ORAL_TABLET | Freq: Every day | ORAL | Status: DC
  Administered 2023-12-30 – 2023-12-31 (×2): 300 mg via ORAL
  Filled 2023-12-29 (×2): qty 1

## 2023-12-29 MED ORDER — LOSARTAN POTASSIUM 50 MG PO TABS
75.0000 mg | ORAL_TABLET | Freq: Every day | ORAL | Status: DC
  Administered 2023-12-30 – 2023-12-31 (×2): 75 mg via ORAL
  Filled 2023-12-29 (×2): qty 1

## 2023-12-29 MED ORDER — TORSEMIDE 20 MG PO TABS
20.0000 mg | ORAL_TABLET | Freq: Every day | ORAL | Status: DC
  Administered 2023-12-30 – 2023-12-31 (×2): 20 mg via ORAL
  Filled 2023-12-29 (×2): qty 1

## 2023-12-29 MED ORDER — IPRATROPIUM-ALBUTEROL 0.5-2.5 (3) MG/3ML IN SOLN
3.0000 mL | Freq: Three times a day (TID) | RESPIRATORY_TRACT | Status: DC
  Administered 2023-12-30 – 2023-12-31 (×4): 3 mL via RESPIRATORY_TRACT
  Filled 2023-12-29 (×4): qty 3

## 2023-12-29 MED ORDER — SENNOSIDES-DOCUSATE SODIUM 8.6-50 MG PO TABS: 1.0000 | ORAL_TABLET | Freq: Every evening | ORAL | Status: DC | PRN

## 2023-12-29 MED ORDER — MEMANTINE HCL 10 MG PO TABS
5.0000 mg | ORAL_TABLET | Freq: Two times a day (BID) | ORAL | Status: DC
  Administered 2023-12-29 – 2023-12-31 (×4): 5 mg via ORAL
  Filled 2023-12-29 (×4): qty 1

## 2023-12-29 MED ORDER — CLOPIDOGREL BISULFATE 75 MG PO TABS
75.0000 mg | ORAL_TABLET | Freq: Every day | ORAL | Status: DC
  Administered 2023-12-30 – 2023-12-31 (×2): 75 mg via ORAL
  Filled 2023-12-29 (×2): qty 1

## 2023-12-29 MED ORDER — SODIUM CHLORIDE 0.9 % IV SOLN
500.0000 mg | Freq: Once | INTRAVENOUS | Status: AC
  Administered 2023-12-29: 500 mg via INTRAVENOUS
  Filled 2023-12-29: qty 5

## 2023-12-29 MED ORDER — INSULIN ASPART 100 UNIT/ML IJ SOLN
0.0000 [IU] | Freq: Every day | INTRAMUSCULAR | Status: DC
  Administered 2023-12-29: 2 [IU] via SUBCUTANEOUS
  Filled 2023-12-29: qty 1

## 2023-12-29 MED ORDER — TAMSULOSIN HCL 0.4 MG PO CAPS
0.4000 mg | ORAL_CAPSULE | Freq: Every day | ORAL | Status: DC
  Administered 2023-12-29 – 2023-12-30 (×2): 0.4 mg via ORAL
  Filled 2023-12-29 (×2): qty 1

## 2023-12-29 MED ORDER — PREDNISONE 20 MG PO TABS: 40.0000 mg | ORAL_TABLET | Freq: Every day | ORAL | Status: DC

## 2023-12-29 MED ORDER — SODIUM CHLORIDE 0.9 % IV SOLN
2.0000 g | INTRAVENOUS | Status: DC
  Administered 2023-12-30: 2 g via INTRAVENOUS
  Filled 2023-12-29: qty 20

## 2023-12-29 MED ORDER — ONDANSETRON HCL 4 MG PO TABS: 4.0000 mg | ORAL_TABLET | Freq: Four times a day (QID) | ORAL | Status: DC | PRN

## 2023-12-29 MED ORDER — INSULIN ASPART 100 UNIT/ML IJ SOLN: 0.0000 [IU] | Freq: Three times a day (TID) | INTRAMUSCULAR | Status: DC

## 2023-12-29 MED ORDER — METHYLPREDNISOLONE SODIUM SUCC 125 MG IJ SOLR
125.0000 mg | Freq: Once | INTRAMUSCULAR | Status: AC
  Administered 2023-12-29: 125 mg via INTRAVENOUS
  Filled 2023-12-29: qty 2

## 2023-12-29 MED ORDER — ATORVASTATIN CALCIUM 40 MG PO TABS
80.0000 mg | ORAL_TABLET | Freq: Every day | ORAL | Status: DC
  Administered 2023-12-30 – 2023-12-31 (×2): 80 mg via ORAL
  Filled 2023-12-29 (×2): qty 2

## 2023-12-29 MED ORDER — IPRATROPIUM-ALBUTEROL 0.5-2.5 (3) MG/3ML IN SOLN: 3.0000 mL | RESPIRATORY_TRACT | Status: DC | PRN

## 2023-12-29 MED ORDER — ACETAMINOPHEN 325 MG PO TABS: 650.0000 mg | ORAL_TABLET | Freq: Four times a day (QID) | ORAL | Status: DC | PRN

## 2023-12-29 MED ORDER — SODIUM CHLORIDE 0.9% FLUSH
3.0000 mL | Freq: Two times a day (BID) | INTRAVENOUS | Status: DC
  Administered 2023-12-29 – 2023-12-31 (×4): 3 mL via INTRAVENOUS

## 2023-12-29 MED ORDER — METHYLPREDNISOLONE SODIUM SUCC 125 MG IJ SOLR: 125.0000 mg | Freq: Two times a day (BID) | INTRAMUSCULAR | Status: DC

## 2023-12-29 MED ORDER — ALBUTEROL SULFATE (2.5 MG/3ML) 0.083% IN NEBU
5.0000 mg | INHALATION_SOLUTION | Freq: Once | RESPIRATORY_TRACT | Status: AC
  Administered 2023-12-29: 5 mg via RESPIRATORY_TRACT
  Filled 2023-12-29: qty 6

## 2023-12-29 NOTE — ED Provider Notes (Signed)
 [place] [facility]PARTMENT AT [facility] Provider Note   CSN: [id number] Arrival date & time: 12/29/23  8084     Patient presents with: Chest Pain   Kevin Frank is a 83 y.o. male.    Chest Pain Patient with history of dementia.  Cannot provide much history but history comes from family hours.  Reportedly hypoxic on room air but appears to be on 3 L chronically.  Has dementia and cannot really provide any history but per family report he was grabbing the right side of his chest.  Has not been acting right today and not eating much. Per family ember normally would not be able to answer the questions however.   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 Authorizing Provider  albuterol  (PROVENTIL ) (2.5 MG/3ML) 0.083% nebulizer solution Take 3 mLs (2.5 mg total) by nebulization every 4 (four) hours as needed for wheezing or shortness of breath. 10/26/23  Yes Pearlean Manus, MD  albuterol  (VENTOLIN  HFA) 108 (90 Base) MCG/ACT inhaler Inhale 2 puffs into the lungs every 4 (four) hours as needed for wheezing or shortness of breath (cough). 10/26/23  Yes Emokpae, Courage, MD  allopurinol  (ZYLOPRIM ) 300 MG tablet Take 1 tablet (300 mg total) by mouth daily. 09/07/19  Yes Corum, Olam CROME,  MD  apixaban  (ELIQUIS ) 5 MG TABS tablet Take 1 tablet (5 mg total) by mouth 2 (two) times daily. 09/28/23  Yes Miriam Norris, NP  atorvastatin  (LIPITOR) 80 MG tablet Take 1 tablet (80 mg total) by mouth daily. 09/28/23  Yes Miriam Norris, NP  clopidogrel  (PLAVIX ) 75 MG tablet Take 1 tablet (75 mg total) by mouth daily. 09/28/23  Yes Miriam Norris, NP  Cobalamin Combinations (NEURIVA PLUS) CAPS Take 1 tablet by mouth daily.   Yes [provider]  EPINEPHrine  0.3 mg/0.3 mL IJ SOAJ injection Inject 0.3 mg into the muscle as needed for anaphylaxis. 10/26/23  Yes Pearlean Manus, MD  famotidine  (PEPCID ) 20 MG tablet Take 1 tablet (20 mg total) by mouth 2 (two) times daily. 10/26/23  Yes Pearlean Manus, MD  Fluticasone -Umeclidin-Vilant (TRELEGY ELLIPTA ) 100-62.5-25 MCG/ACT AEPB Inhale 1 puff into the lungs daily. 10/26/23  Yes Emokpae, Courage, MD  guaiFENesin  (MUCINEX ) 600 MG 12 hr tablet Take 1 tablet (600 mg total) by mouth 2 (two) times daily. 10/26/23 10/25/24 Yes Emokpae, Courage, MD  losartan  (COZAAR ) 50 MG tablet Take 1.5 tablets (75 mg total) by mouth daily. 11/20/23  Yes Miriam Norris, NP  memantine  (NAMENDA ) 5 MG tablet Take 5 mg by mouth 2 (two) times daily.   Yes [provider]  metFORMIN  (GLUCOPHAGE ) 500 MG tablet Take 2 tablets (1,000 mg total) by mouth 2 (two) times daily. 10/26/23  Yes Pearlean Manus, MD  metoprolol  succinate (TOPROL -XL) 50 MG 24 hr tablet Take 1 tablet (50 mg total) by mouth 2 (two) times daily. Take with or immediately following a meal. 10/26/23  Yes Emokpae, Courage, MD  nicotine  (NICODERM CQ  - DOSED IN MG/24 HOURS) 21 mg/24hr patch Place 1 patch (21 mg total) onto the skin daily. 10/27/23  Yes Emokpae, Courage, MD  nitroGLYCERIN  (NITROSTAT ) 0.4 MG SL tablet Place 1 tablet (0.4 mg total) under the tongue every 5 (five) minutes as needed for chest pain. 08/11/23 08/10/24 Yes Duke, Jon Garre, PA  potassium chloride  (KLOR-CON ) 10 MEQ tablet Take 1 tablet (10 mEq  total) by mouth daily. Take While taking Demadex /Torsemide  10/26/23  Yes Emokpae, Courage, MD  predniSONE  (DELTASONE ) 20 MG tablet Take 20 mg by mouth daily with breakfast. 12/07/23  Yes [provider]  sertraline  (ZOLOFT ) 50 MG tablet Take 1 tablet (50 mg total) by mouth daily. 10/26/23  Yes Emokpae, Courage, MD  tamsulosin  (FLOMAX ) 0.4 MG CAPS capsule Take 1 capsule (0.4 mg total) by mouth daily after supper. 10/26/23  Yes Emokpae, Courage, MD  torsemide  (DEMADEX ) 20 MG tablet Take 1 tablet (20 mg total) by mouth daily. 10/26/23  Yes Emokpae, Courage, MD  amLODipine  (NORVASC ) 10 MG tablet Take 10 mg by mouth daily. 12/24/23   [provider]    Allergies: Amiodarone     Review of Systems  Cardiovascular:  Positive for chest pain.    Updated Vital Signs BP (!) 172/76   Pulse 85   Temp 99.1 F (37.3 C) (Oral)   Resp (!) 24   SpO2 95%   Physical Exam Vitals and nursing note reviewed.  Cardiovascular:     Rate and Rhythm: Regular rhythm.  Pulmonary:     Breath sounds: Wheezing present.     Comments: Diffuse wheezes and prolonged expirations. Abdominal:     Tenderness: There is no abdominal tenderness.  Musculoskeletal:     Right lower leg: No edema.     Left lower leg: No edema.  Skin:    General: Skin is warm.  Neurological:     Mental Status: He is alert.     (all labs ordered are listed, but only abnormal results are displayed) Labs Reviewed  BASIC METABOLIC PANEL WITH GFR - Abnormal; Notable for the following components:      Result Value   Glucose, Bld 218 (*)    All other components within normal limits  CBC - Abnormal; Notable for the following components:   WBC 15.6 (*)    RDW 15.7 (*)    All other components within normal limits  TROPONIN I (HIGH SENSITIVITY) - Abnormal; Notable for the following components:   Troponin I (High Sensitivity) 26 (*)    All other components within normal limits  RESP PANEL BY RT-PCR (RSV, FLU A&B, COVID)  [id number]   TROPONIN I (HIGH SENSITIVITY)    EKG: EKG Interpretation Date/Time:  Tuesday December 29 2023 19:19:56 EDT Ventricular Rate:  96 PR Interval:  144 QRS Duration:  104 QT Interval:  354 QTC Calculation: 447 R Axis:   -61  Text Interpretation: Normal sinus rhythm Left axis deviation Incomplete right bundle branch block Inferior infarct , age undetermined Abnormal ECG When compared with ECG of 23-Oct-2023 13:27, Inferior T waves now upgoing Confirmed by Patsey Lot [id number]) on 12/29/2023 7:26:30 PM  Radiology: [facility]  Chest 2 View Result Date: 12/29/2023 CLINICAL DATA:  Chest pain EXAM: CHEST - 2 VIEW COMPARISON:  Chest radiograph dated 10/23/2023 FINDINGS: Normal lung volumes. Increased bilateral peripheral patchy opacities. No pleural effusion or pneumothorax. Similar enlarged cardiomediastinal silhouette. No acute osseous abnormality. IMPRESSION: Increased bilateral peripheral patchy opacities, which may represent atypical infection. Electronically Signed   By: Limin  Xu M.D.   On: 12/29/2023 20:07     Procedures   Medications Ordered in the ED  cefTRIAXone  (ROCEPHIN ) 2 g in sodium chloride  0.9 % 100 mL IVPB (2 g Intravenous New Bag/Given 12/29/23 2103)  azithromycin  (ZITHROMAX ) 500 mg in sodium chloride  0.9 % 250 mL IVPB (has no administration in time range)  albuterol  (PROVENTIL ) (2.5 MG/3ML) 0.083% nebulizer solution 5 mg (5 mg Nebulization Given 12/29/23 2002)  ipratropium (ATROVENT ) nebulizer solution 0.5 mg (0.5 mg Nebulization Given 12/29/23 2002)  methylPREDNISolone  sodium succinate (SOLU-MEDROL ) 125 mg/2 mL injection 125 mg (125 mg Intravenous Given 12/29/23 2040)                                    Medical Decision Making Amount and/or Complexity of Data Reviewed Labs: ordered. Radiology: ordered.  Risk Prescription drug management.   Patient with some mental status change.  History of dementia.  Is hypoxic but able to be on chronic oxygen .  Questionable chest pain.  EKG  reassuring.  Reviewed recent discharge note.  Will get x-ray blood work and give breathing treatment and steroids.  Blood work reassuring.  X-ray shows potential multifocal pneumonia.  With increasing shortness of breath I feel patient benefit from admission to hospital.  Does have chronic lung disease on chronic oxygen .  Will discuss with hospitalist for admission.       Final diagnoses:  Community acquired pneumonia, unspecified laterality    ED Discharge Orders     None          Patsey Lot, MD 12/29/23 2124

## 2023-12-29 NOTE — ED Triage Notes (Signed)
 Pt BIB family form home states pt has not been acting right today. Pt was grabbing at the right side of his chest on the way to the ED. Family states pt has been stumbling and noted that he was pale.   Pt is 85% on RA during triage.

## 2023-12-29 NOTE — ED Notes (Signed)
Pharmacy at bedside

## 2023-12-29 NOTE — ED Notes (Signed)
 ED Provider at bedside.

## 2023-12-29 NOTE — Telephone Encounter (Signed)
 Pts wife states he's not feeling well and she not sure why and would like to come in the office tomorrow or get a c/b from the nurse.

## 2023-12-29 NOTE — ED Notes (Signed)
 Patient transported to X-ray

## 2023-12-29 NOTE — Telephone Encounter (Signed)
 Spoke to wife Nadia) - states that he is just not feeling good since having stents placed in February.  Did go to urgent care about 2 weeks ago for a cough.  Was given Prednisone  & antibiotic.  Now for the last 3-4 days still has cough.  No fever.  No chest x-ray was done at that visit.  Has not been seen by his pcp yet.  No c/o chest pain.  Does have SOB but has this all the time as he has COPD.  Also, c/o nausea.  Not able to give any information on weights as he does not weigh & wants to stay in bed all the time.  Patient has upcoming new patient appointments with Neurology on 02/18/24 & Pulmonology on 01/21/24.  Next Branch appointment is 02/29/24.  Advised wife to have patient see pcp for continued cough.  In the meantime, if symptoms worsen - may go back to urgent care of ED for evaluation.

## 2023-12-29 NOTE — H&P (Signed)
 History and Physical    Kevin Frank FMW:[id number] DOB: January 23, 1941 DOA: 12/29/2023  PCP: Bertell Satterfield, MD   Patient coming from: Home   Chief Complaint: Lethargy, increased cough, loss of appetite   HPI: Kevin Frank is a 83 y.o. male with medical history significant for COPD/asthma overlap, chronic hypoxic respiratory failure, OSA on CPAP, CAD, PAF on Eliquis , hypertension, type 2 diabetes mellitus, and reported dementia who presents with fatigue, loss of appetite, and increased cough.  Patient is accompanied by his daughter and wife who assists with the history.  He has had worsening cough and lethargy over the past 3 or 4 days.  He seemed to be worse today, did not eat which is very unusual for him, and appeared to be short of breath and was holding his right chest.  His family notes that he often has lower leg swelling but not currently.  Patient denies any pain and has no complaints in the ED.  ED Course: Upon arrival to the ED, patient is found to be afebrile and saturating low 90s on 4 to 5 L/min of supplemental oxygen  with mild tachypnea, normal HR, and stable BP.  Labs are most notable for glucose 218, normal creatinine, WBC 15,600, normal hemoglobin, and troponin 26.  Patient was treated in the ED with IV Solu-Medrol , Atrovent , albuterol , Rocephin , and azithromycin .  Review of Systems:  ROS limited by patient's clinical condition.  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    Normal nuclear stress test 2011   Obesity    Obstructive sleep  apnea on CPAP    Presence of indwelling urinary catheter    Shortness of breath    due to cigarette abuse   Tobacco abuse     Past Surgical History:  Procedure Laterality Date   ABDOMINAL AORTIC ENDOVASCULAR FENESTRATED STENT GRAFT N/A 04/09/2016   Procedure: ABDOMINAL AORTIC ENDOVASCULAR FENESTRATED STENT GRAFT with left brachial artery access using ultrasound guidance;  Surgeon: Gaile LELON New, MD;  Location: [facility] OR;  Service: Vascular;  Laterality: N/A;   CHOLECYSTECTOMY  1997   [facility]: Lambert Rhonda W   COLONOSCOPY  2006   2006: multiple diminutive polyps in rectum, one pedunculated polyp at 10 cm s/p snare removal. Left-sided diverticulum, pedunculated polyps at 35 cm and splenic flexure s/p snare removal. No path available.    COLONOSCOPY WITH PROPOFOL  N/A 11/24/2019   Procedure: COLONOSCOPY WITH PROPOFOL ;  Surgeon: Shaaron Lamar HERO, MD;  Location: [facility] END[facility] SUITE;  Service: Endoscopy;  Laterality: N/A;  9:45am   CORONARY/GRAFT ACUTE MI REVASCULARIZATION N/A 08/04/2023   Procedure: Coronary/Graft Acute MI Revascularization;  Surgeon: Wendel Lurena POUR, MD;  Location: [facility] INVASIVE CV LAB;  Service: Cardiovascular;  Laterality: N/A;   PERIPHERAL VASCULAR CATHETERIZATION N/A 04/10/2016   Procedure: Renal Angiography;  Surgeon: Lonni GORMAN Blade, MD;  Location: [facility] INVASIVE CV LAB;  Service: Cardiovascular;  Laterality: N/A;   POLYPECTOMY  11/24/2019   Procedure: POLYPECTOMY;  Surgeon:  Rourk, Lamar HERO, MD;  Location: [facility];  Service: Endoscopy;;   TRANSURETHRAL RESECTION OF PROSTATE  01/07/2011   Procedure: TRANSURETHRAL RESECTION OF THE PROSTATE (TURP);  Surgeon: Mohammad I Javaid;  Location: [facility] ORS;  Service: Urology;  Laterality: N/A;    Social History:   reports that he has been smoking cigarettes. He has a 100 pack-year smoking history. He has never been exposed to tobacco smoke. He has never used smokeless tobacco. He reports current alcohol use. He reports that he does not use drugs.  Allergies   Allergen Reactions   Amiodarone  Hives and Rash    Family History  Problem Relation Age of Onset   Liver disease Father    Lymphoma Mother    Diabetes Mother    Hypertension Mother    Cancer Mother    Mental retardation Other        sibling -- from birth    Asthma Daughter    Allergic rhinitis Neg Hx    Angioedema Neg Hx    Atopy Neg Hx    Eczema Neg Hx    Immunodeficiency Neg Hx    Colon cancer Neg Hx      Prior to Admission medications   Medication Sig Start Date End Date Taking? Authorizing Provider  albuterol  (PROVENTIL ) (2.5 MG/3ML) 0.083% nebulizer solution Take 3 mLs (2.5 mg total) by nebulization every 4 (four) hours as needed for wheezing or shortness of breath. 10/26/23  Yes Pearlean Manus, MD  albuterol  (VENTOLIN  HFA) 108 (90 Base) MCG/ACT inhaler Inhale 2 puffs into the lungs every 4 (four) hours as needed for wheezing or shortness of breath (cough). 10/26/23  Yes Emokpae, Courage, MD  allopurinol  (ZYLOPRIM ) 300 MG tablet Take 1 tablet (300 mg total) by mouth daily. 09/07/19  Yes Corum, Olam CROME, MD  apixaban  (ELIQUIS ) 5 MG TABS tablet Take 1 tablet (5 mg total) by mouth 2 (two) times daily. 09/28/23  Yes Miriam Norris, NP  atorvastatin  (LIPITOR) 80 MG tablet Take 1 tablet (80 mg total) by mouth daily. 09/28/23  Yes Miriam Norris, NP  clopidogrel  (PLAVIX ) 75 MG tablet Take 1 tablet (75 mg total) by mouth daily. 09/28/23  Yes Miriam Norris, NP  Cobalamin Combinations (NEURIVA PLUS) CAPS Take 1 tablet by mouth daily.   Yes [provider]  EPINEPHrine  0.3 mg/0.3 mL IJ SOAJ injection Inject 0.3 mg into the muscle as needed for anaphylaxis. 10/26/23  Yes Emokpae, Courage, MD  famotidine  (PEPCID ) 20 MG tablet Take 1 tablet (20 mg total) by mouth 2 (two) times daily. 10/26/23  Yes Pearlean Manus, MD  Fluticasone -Umeclidin-Vilant (TRELEGY ELLIPTA ) 100-62.5-25 MCG/ACT AEPB Inhale 1 puff into the lungs daily. 10/26/23  Yes Emokpae, Courage, MD  guaiFENesin  (MUCINEX ) 600 MG 12  hr tablet Take 1 tablet (600 mg total) by mouth 2 (two) times daily. 10/26/23 10/25/24 Yes Emokpae, Courage, MD  losartan  (COZAAR ) 50 MG tablet Take 1.5 tablets (75 mg total) by mouth daily. 11/20/23  Yes Miriam Norris, NP  memantine  (NAMENDA ) 5 MG tablet Take 5 mg by mouth 2 (two) times daily.   Yes [provider]  metFORMIN  (GLUCOPHAGE ) 500 MG tablet Take 2 tablets (1,000 mg total) by mouth 2 (two) times daily. 10/26/23  Yes Pearlean Manus, MD  metoprolol  succinate (TOPROL -XL) 50 MG 24 hr tablet Take 1 tablet (50 mg total) by mouth 2 (two) times daily. Take with or immediately following a meal. 10/26/23  Yes Emokpae, Courage, MD  nicotine  (NICODERM CQ  - DOSED IN MG/24 HOURS) 21  mg/24hr patch Place 1 patch (21 mg total) onto the skin daily. 10/27/23  Yes Emokpae, Courage, MD  nitroGLYCERIN  (NITROSTAT ) 0.4 MG SL tablet Place 1 tablet (0.4 mg total) under the tongue every 5 (five) minutes as needed for chest pain. 08/11/23 08/10/24 Yes Duke, Jon Garre, PA  potassium chloride  (KLOR-CON ) 10 MEQ tablet Take 1 tablet (10 mEq total) by mouth daily. Take While taking Demadex /Torsemide  10/26/23  Yes Emokpae, Courage, MD  predniSONE  (DELTASONE ) 20 MG tablet Take 20 mg by mouth daily with breakfast. 12/07/23  Yes [provider]  sertraline  (ZOLOFT ) 50 MG tablet Take 1 tablet (50 mg total) by mouth daily. 10/26/23  Yes Pearlean Manus, MD  tamsulosin  (FLOMAX ) 0.4 MG CAPS capsule Take 1 capsule (0.4 mg total) by mouth daily after supper. 10/26/23  Yes Emokpae, Courage, MD  torsemide  (DEMADEX ) 20 MG tablet Take 1 tablet (20 mg total) by mouth daily. 10/26/23  Yes Emokpae, Courage, MD  amLODipine  (NORVASC ) 10 MG tablet Take 10 mg by mouth daily. 12/24/23   [provider]    Physical Exam: Vitals:   12/29/23 2130 12/29/23 2145 12/29/23 2200 12/29/23 2215  BP: 127/80  (!) 140/56   Pulse: 91 85 87 86  Resp: (!) 23 (!) 24 (!) 24   Temp:      TempSrc:      SpO2: 93% 94% 93% 95%     Constitutional: NAD, no pallor or diaphoresis   Eyes: PERTLA, lids and conjunctivae normal ENMT: Mucous membranes are moist. Posterior pharynx clear of any exudate or lesions.   Neck: supple, no masses  Respiratory: Prolonged expiratory phase, coarse rhonchi. Increased WOB.  Cardiovascular: S1 & S2 heard, regular rate and rhythm. No extremity edema.  Abdomen: No tenderness, soft. Bowel sounds active.  Musculoskeletal: no clubbing / cyanosis. No joint deformity upper and lower extremities.   Skin: no significant rashes, lesions, ulcers. Warm, dry, well-perfused. Neurologic: CN 2-12 grossly intact. Moving all extremities. Alert and oriented to person.  Psychiatric: Calm. Cooperative.    Labs and Imaging on Admission: I have personally reviewed following labs and imaging studies  CBC: Recent Labs  Lab 12/29/23 1942  WBC 15.6*  HGB 15.0  HCT 43.8  MCV 93.8  PLT 186   Basic Metabolic Panel: Recent Labs  Lab 12/29/23 1942  NA 137  K 3.8  CL 99  CO2 27  GLUCOSE 218*  BUN 18  CREATININE 0.97  CALCIUM  9.6   GFR: CrCl cannot be calculated (Unknown ideal weight.). Liver Function Tests: No results for input(s): AST, ALT, ALKPHOS, BILITOT, PROT, ALBUMIN in the last 168 hours. No results for input(s): LIPASE, AMYLASE in the last 168 hours. No results for input(s): AMMONIA in the last 168 hours. Coagulation Profile: No results for input(s): INR, PROTIME in the last 168 hours. Cardiac Enzymes: No results for input(s): CKTOTAL, CKMB, CKMBINDEX, TROPONINI in the last 168 hours. BNP (last 3 results) No results for input(s): PROBNP in the last 8760 hours. HbA1C: No results for input(s): HGBA1C in the last 72 hours. CBG: Recent Labs  Lab 12/29/23 2226  GLUCAP 215*   Lipid Profile: No results for input(s): CHOL, HDL, LDLCALC, TRIG, CHOLHDL, LDLDIRECT in the last 72 hours. Thyroid  Function Tests: No results for input(s): TSH,  T4TOTAL, FREET4, T3FREE, THYROIDAB in the last 72 hours. Anemia Panel: No results for input(s): VITAMINB12, FOLATE, FERRITIN, TIBC, IRON, RETICCTPCT in the last 72 hours. Urine analysis:    Component Value Date/Time   COLORURINE YELLOW 08/07/2023 1922  APPEARANCEUR HAZY (A) 08/07/2023 1922   APPEARANCEUR Clear 07/18/2021 1010   LABSPEC 1.021 08/07/2023 1922   PHURINE 7.0 08/07/2023 1922   GLUCOSEU >=500 (A) 08/07/2023 1922   HGBUR SMALL (A) 08/07/2023 1922   BILIRUBINUR NEGATIVE 08/07/2023 1922   BILIRUBINUR Negative 07/18/2021 1010   KETONESUR NEGATIVE 08/07/2023 1922   PROTEINUR 30 (A) 08/07/2023 1922   NITRITE POSITIVE (A) 08/07/2023 1922   LEUKOCYTESUR MODERATE (A) 08/07/2023 1922   Sepsis Labs: @LABRCNTIP (procalcitonin:4,lacticidven:4) ) Recent Results (from the past 240 hours)  Resp panel by RT-PCR (RSV, Flu A&B, Covid) Anterior Nasal Swab     Status: None   Collection Time: 12/29/23  9:02 PM   Specimen: Anterior Nasal Swab  Result Value Ref Range Status   SARS Coronavirus 2 by RT PCR NEGATIVE NEGATIVE Final    Comment: (NOTE) SARS-CoV-2 target nucleic acids are NOT DETECTED.  The SARS-CoV-2 RNA is generally detectable in upper respiratory specimens during the acute phase of infection. The lowest concentration of SARS-CoV-2 viral copies this assay can detect is 138 copies/mL. A negative result does not preclude SARS-Cov-2 infection and should not be used as the sole basis for treatment or other patient management decisions. A negative result may occur with  improper specimen collection/handling, submission of specimen other than nasopharyngeal swab, presence of viral mutation(s) within the areas targeted by this assay, and inadequate number of viral copies(<138 copies/mL). A negative result must be combined with clinical observations, patient history, and epidemiological information. The expected result is Negative.  Fact Sheet for Patients:   BloggerCourse.com  Fact Sheet for Healthcare Providers:  SeriousBroker.it  This test is no t yet approved or cleared by the [place]  FDA and  has been authorized for detection and/or diagnosis of SARS-CoV-2 by FDA under an Emergency Use Authorization (EUA). This EUA will remain  in effect (meaning this test can be used) for the duration of the COVID-19 declaration under Section 564(b)(1) of the Act, 21 U.S.C.section 360bbb-3(b)(1), unless the authorization is terminated  or revoked sooner.       Influenza A by PCR NEGATIVE NEGATIVE Final   Influenza B by PCR NEGATIVE NEGATIVE Final    Comment: (NOTE) The Xpert Xpress SARS-CoV-2/FLU/RSV plus assay is intended as an aid in the diagnosis of influenza from Nasopharyngeal swab specimens and should not be used as a sole basis for treatment. Nasal washings and aspirates are unacceptable for Xpert Xpress SARS-CoV-2/FLU/RSV testing.  Fact Sheet for Patients: BloggerCourse.com  Fact Sheet for Healthcare Providers: SeriousBroker.it  This test is not yet approved or cleared by the [place]  FDA and has been authorized for detection and/or diagnosis of SARS-CoV-2 by FDA under an Emergency Use Authorization (EUA). This EUA will remain in effect (meaning this test can be used) for the duration of the COVID-19 declaration under Section 564(b)(1) of the Act, 21 U.S.C. section 360bbb-3(b)(1), unless the authorization is terminated or revoked.     Resp Syncytial Virus by PCR NEGATIVE NEGATIVE Final    Comment: (NOTE) Fact Sheet for Patients: BloggerCourse.com  Fact Sheet for Healthcare Providers: SeriousBroker.it  This test is not yet approved or cleared by the [place]  FDA and has been authorized for detection and/or diagnosis of SARS-CoV-2 by FDA under an Emergency Use  Authorization (EUA). This EUA will remain in effect (meaning this test can be used) for the duration of the COVID-19 declaration under Section 564(b)(1) of the Act, 21 U.S.C. section 360bbb-3(b)(1), unless the authorization is terminated or revoked.  Performed at [facility]  [facility], [address], [place], [place] [address]      Radiological Exams on Admission: DG Chest 2 View Result Date: 12/29/2023 CLINICAL DATA:  Chest pain EXAM: CHEST - 2 VIEW COMPARISON:  Chest radiograph dated 10/23/2023 FINDINGS: Normal lung volumes. Increased bilateral peripheral patchy opacities. No pleural effusion or pneumothorax. Similar enlarged cardiomediastinal silhouette. No acute osseous abnormality. IMPRESSION: Increased bilateral peripheral patchy opacities, which may represent atypical infection. Electronically Signed   By: Limin  Xu M.D.   On: 12/29/2023 20:07    EKG: Independently reviewed. Sinus rhythm, LAD, incomplete RBBB.   Assessment/Plan   1. Pneumonia; COPD exacerbation; acute on chronic hypoxic respiratory failure  - Culture sputum, check strep pneumo and legionella antigens, check/trend procalcitonin, continue Rocephin  and azithromycin , continue systemic steroids, continue short-acting bronchodilators, continue supplemental O2    2. OSA  - CPAP while sleeping    3. CAD  - No anginal complaints  - Continue Lipitor and Plavix     4. Chronic HFpEF  - Appears compensated  - Continue torsemide    5. Hypertension  - Losartan    6. Type II DM  - A1c was 8.4% in February 2025  - Check CBGs and use low-intensity SSI for now   7. PAF  - Continue Eliquis      DVT prophylaxis: Eliquis   Code Status: Full  Level of Care: Level of care: Telemetry Family Communication: wife and daughter at bedside  Disposition Plan:  Patient is from: Home  Anticipated d/c is to: TBD Anticipated d/c date is: 01/01/24  Patient currently: Pending improved respiratory status Consults called: None  Admission  status: Inpatient     Evalene GORMAN Sprinkles, MD [facility]  12/29/2023, 10:45 PM

## 2023-12-30 DIAGNOSIS — J189 Pneumonia, unspecified organism: Secondary | ICD-10-CM | POA: Diagnosis not present

## 2023-12-30 LAB — CBC
HCT: 43 % (ref 39.0–52.0)
Hemoglobin: 14.7 g/dL (ref 13.0–17.0)
MCH: 32.2 pg (ref 26.0–34.0)
MCHC: 34.2 g/dL (ref 30.0–36.0)
MCV: 94.3 fL (ref 80.0–100.0)
Platelets: 163 K/uL (ref 150–400)
RBC: 4.56 MIL/uL (ref 4.22–5.81)
RDW: 15.5 % (ref 11.5–15.5)
WBC: 12.5 K/uL — ABNORMAL HIGH (ref 4.0–10.5)
nRBC: 0 % (ref 0.0–0.2)

## 2023-12-30 LAB — RESPIRATORY PANEL BY PCR

## 2023-12-30 LAB — GLUCOSE, RANDOM
Glucose, Bld: 406 mg/dL — ABNORMAL HIGH (ref 70–99)
Glucose, Bld: 451 mg/dL — ABNORMAL HIGH (ref 70–99)

## 2023-12-30 LAB — BASIC METABOLIC PANEL WITH GFR
Anion gap: 13 (ref 5–15)
BUN: 19 mg/dL (ref 8–23)
CO2: 24 mmol/L (ref 22–32)
Calcium: 9.3 mg/dL (ref 8.9–10.3)
Chloride: 100 mmol/L (ref 98–111)
Creatinine, Ser: 0.9 mg/dL (ref 0.61–1.24)
GFR, Estimated: >60 mL/min (ref >60)
Glucose, Bld: 297 mg/dL — ABNORMAL HIGH (ref 70–99)
Potassium: 4.1 mmol/L (ref 3.5–5.1)
Sodium: 137 mmol/L (ref 135–145)

## 2023-12-30 LAB — GLUCOSE, CAPILLARY
Glucose-Capillary: 407 mg/dL — ABNORMAL HIGH (ref 70–99)
Glucose-Capillary: 409 mg/dL — ABNORMAL HIGH (ref 70–99)
Glucose-Capillary: 164 mg/dL — ABNORMAL HIGH (ref 70–99)

## 2023-12-30 LAB — MAGNESIUM: Magnesium: 1.6 mg/dL — ABNORMAL LOW (ref 1.7–2.4)

## 2023-12-30 LAB — PROCALCITONIN: Procalcitonin: <0.1 ng/mL

## 2023-12-30 MED ORDER — MAGNESIUM SULFATE 2 GM/50ML IV SOLN
2.0000 g | Freq: Once | INTRAVENOUS | Status: AC
  Administered 2023-12-30: 2 g via INTRAVENOUS
  Filled 2023-12-30: qty 50

## 2023-12-30 MED ORDER — INSULIN ASPART 100 UNIT/ML IJ SOLN: 4.0000 [IU] | Freq: Three times a day (TID) | INTRAMUSCULAR | Status: DC

## 2023-12-30 MED ORDER — INSULIN ASPART 100 UNIT/ML IJ SOLN
3.0000 [IU] | Freq: Three times a day (TID) | INTRAMUSCULAR | Status: DC
  Administered 2023-12-30 (×2): 3 [IU] via SUBCUTANEOUS

## 2023-12-30 MED ORDER — INSULIN ASPART 100 UNIT/ML IJ SOLN
0.0000 [IU] | Freq: Three times a day (TID) | INTRAMUSCULAR | Status: DC
  Administered 2023-12-30: 5 [IU] via SUBCUTANEOUS

## 2023-12-30 MED ORDER — METHYLPREDNISOLONE SODIUM SUCC 40 MG IJ SOLR
40.0000 mg | Freq: Two times a day (BID) | INTRAMUSCULAR | Status: AC
  Administered 2023-12-30 (×2): 40 mg via INTRAVENOUS
  Filled 2023-12-30 (×2): qty 1

## 2023-12-30 MED ORDER — INSULIN GLARGINE-YFGN 100 UNIT/ML ~~LOC~~ SOLN
10.0000 [IU] | Freq: Every day | SUBCUTANEOUS | Status: DC
  Administered 2023-12-30 – 2023-12-31 (×2): 10 [IU] via SUBCUTANEOUS
  Filled 2023-12-30 (×3): qty 0.1

## 2023-12-30 MED ORDER — INSULIN ASPART 100 UNIT/ML IJ SOLN: 0.0000 [IU] | Freq: Every day | INTRAMUSCULAR | Status: DC

## 2023-12-30 MED ORDER — METFORMIN HCL 500 MG PO TABS
1000.0000 mg | ORAL_TABLET | Freq: Two times a day (BID) | ORAL | Status: DC
  Administered 2023-12-30 – 2023-12-31 (×2): 1000 mg via ORAL
  Filled 2023-12-30 (×2): qty 2

## 2023-12-30 MED ORDER — INSULIN ASPART 100 UNIT/ML IJ SOLN
0.0000 [IU] | Freq: Three times a day (TID) | INTRAMUSCULAR | Status: DC
  Administered 2023-12-30: 20 [IU] via SUBCUTANEOUS
  Administered 2023-12-31: 11 [IU] via SUBCUTANEOUS

## 2023-12-30 MED ORDER — PREDNISONE 20 MG PO TABS
40.0000 mg | ORAL_TABLET | Freq: Every day | ORAL | Status: DC
  Administered 2023-12-31: 40 mg via ORAL
  Filled 2023-12-30: qty 2

## 2023-12-30 MED ORDER — INSULIN ASPART 100 UNIT/ML IJ SOLN
20.0000 [IU] | Freq: Once | INTRAMUSCULAR | Status: AC
  Administered 2023-12-30: 20 [IU] via SUBCUTANEOUS

## 2023-12-30 MED ORDER — INSULIN ASPART 100 UNIT/ML IJ SOLN
5.0000 [IU] | Freq: Three times a day (TID) | INTRAMUSCULAR | Status: DC
  Administered 2023-12-30 – 2023-12-31 (×2): 5 [IU] via SUBCUTANEOUS

## 2023-12-30 NOTE — Progress Notes (Signed)
   12/30/23 2227  BiPAP/CPAP/SIPAP  BiPAP/CPAP/SIPAP Pt Type Adult  BiPAP/CPAP/SIPAP DREAMSTATIOND  Mask Type Full face mask  Dentures removed? Not applicable  Mask Size Large  Respiratory Rate 17 breaths/min  Flow Rate 5 lpm  Patient Home Machine No  Patient Home Mask No  Patient Home Tubing No  Auto Titrate Yes  Minimum cmH2O 10 cmH2O  Maximum cmH2O 25 cmH2O  Device Plugged into RED Power Outlet Yes  BiPAP/CPAP /SiPAP Vitals  Pulse Rate 76  Resp 17  SpO2 92 %  Bilateral Breath Sounds Clear;Diminished  MEWS Score/Color  MEWS Score 0  MEWS Score Color Landy

## 2023-12-30 NOTE — Progress Notes (Signed)
   12/30/23 0039  BiPAP/CPAP/SIPAP  $ Non-Invasive Ventilator  Non-Invasive Vent Set Up  $ Face Mask Large  Yes  BiPAP/CPAP/SIPAP Pt Type Adult  BiPAP/CPAP/SIPAP DREAMSTATIOND  Mask Type Full face mask  Dentures removed? Not applicable  Mask Size Large  Respiratory Rate 20 breaths/min  Flow Rate 5 lpm  Patient Home Machine No  Patient Home Mask No  Patient Home Tubing No  Auto Titrate Yes  Minimum cmH2O 10 cmH2O  Maximum cmH2O 25 cmH2O  Device Plugged into RED Power Outlet Yes  BiPAP/CPAP /SiPAP Vitals  Pulse Rate 77  Resp 20  SpO2 90 %  Bilateral Breath Sounds Diminished;Clear  MEWS Score/Color  MEWS Score 0  MEWS Score Color Landy

## 2023-12-30 NOTE — Plan of Care (Signed)
   Problem: Coping: Goal: Ability to adjust to condition or change in health will improve Outcome: Progressing

## 2023-12-30 NOTE — Inpatient Diabetes Management (Signed)
 Inpatient Diabetes Program Recommendations  AACE/ADA: New Consensus Statement on Inpatient Glycemic Control   Target Ranges:  Prepandial:   less than 140 mg/dL      Peak postprandial:   less than 180 mg/dL (1-2 hours)      Critically ill patients:  140 - 180 mg/dL    Latest Reference Range & Units 12/29/23 22:26 12/30/23 12:44  Glucose-Capillary 70 - 99 mg/dL 784 (H) 592 (H)  (H): Data is abnormally high  Review of Glycemic Control  Diabetes history: DM2 Outpatient Diabetes medications: Metformin  1000 mg BID; Prednisone  20 mg QAM Current orders for Inpatient glycemic control: Novolog  0-20 units TID with meals, Novolog  0-5 units at bedtime, Novolog  3 units TID with meals, Novolog  4 units TID with meals, Metformin  1000 mg BID; Solumedrol 40 mg Q12H   Inpatient Diabetes Program Recommendations:    Insulin : CBG 407 mg/dl at 87:55 today; noted one time order for Novolog  20 units given at 13:18 today.   Currently ordered Novolog  4 units TID and Novolog  3 units TID for meal coverage. Please consider changing meal coverage to Novolog  5 units TID with  meals.  Please consider ordering [place] 10 units daily.   Thanks, Earnie Gainer, RN, MSN, CDCES Diabetes Coordinator Inpatient Diabetes Program [phone] (Team Pager from 8am to 5pm)

## 2023-12-30 NOTE — Progress Notes (Signed)
 PROGRESS NOTE    Kevin Frank  FMW:[id number] DOB: 1941-01-08 DOA: 12/29/2023 PCP: Bertell Satterfield, MD   Brief Narrative:    Kevin Frank is a 83 y.o. male with medical history significant for COPD/asthma overlap, chronic hypoxic respiratory failure, OSA on CPAP, CAD, PAF on Eliquis , hypertension, type 2 diabetes mellitus, and reported dementia who presents with fatigue, loss of appetite, and increased cough.  Patient was admitted with acute on chronic hypoxemic respiratory failure in the setting of pneumonia with acute COPD exacerbation.  Assessment & Plan:   Principal Problem:   Pneumonia Active Problems:   OBSTRUCTIVE SLEEP APNEA   Essential hypertension   Uncontrolled type 2 diabetes mellitus with hyperglycemia (HCC)   COPD with acute exacerbation (HCC)   Paroxysmal atrial fibrillation (HCC)   Chronic heart failure with preserved ejection fraction (HFpEF) (HCC)   Acute and chronic respiratory failure with hypoxia (HCC)  Assessment and Plan:  1. Pneumonia; COPD exacerbation; acute hypoxemic respiratory failure - Culture sputum, check strep pneumo and legionella antigens, check/trend procalcitonin, continue Rocephin  and azithromycin , continue systemic steroids, continue short-acting bronchodilators, continue supplemental O2   - Usually room air at home per RT, may require home oxygen  on discharge -Procalcitonin low -Plan to check respiratory panel   2. OSA  - CPAP while sleeping     3. CAD  - No anginal complaints  - Continue Lipitor and Plavix      4. Chronic HFpEF  - Appears compensated  - Continue torsemide     5. Hypertension  - Losartan     6. Type II DM  - A1c was 8.4% in February 2025  - Check CBGs and use low-intensity SSI for now    7. PAF  - Continue Eliquis     8.  Class I obesity - BMI 32.97  9.  Hypomagnesemia - Replete and reevaluate    DVT prophylaxis: Apixaban  Code Status: Full Family Communication: None at bedside Disposition Plan:   Status is: Inpatient Remains inpatient appropriate because: Need for IV medications.  Consultants:  None  Procedures:  None  Antimicrobials:  Anti-infectives (From admission, onward)    Start     Dose/Rate Route Frequency Ordered Stop   12/30/23 2100  cefTRIAXone  (ROCEPHIN ) 2 g in sodium chloride  0.9 % 100 mL IVPB        2 g 200 mL/hr over 30 Minutes Intravenous Every 24 hours 12/29/23 2200 01/03/24 2059   12/30/23 2100  azithromycin  (ZITHROMAX ) tablet 500 mg        500 mg Oral Daily 12/29/23 2200 01/03/24 0959   12/29/23 2100  cefTRIAXone  (ROCEPHIN ) 2 g in sodium chloride  0.9 % 100 mL IVPB        2 g 200 mL/hr over 30 Minutes Intravenous  Once 12/29/23 2053 12/29/23 2138   12/29/23 2100  azithromycin  (ZITHROMAX ) 500 mg in sodium chloride  0.9 % 250 mL IVPB        500 mg 250 mL/hr over 60 Minutes Intravenous  Once 12/29/23 2053 12/29/23 2240       Subjective: Patient seen and evaluated today with no new acute complaints or concerns. No acute concerns or events noted overnight.  Currently receiving breathing treatment.  Objective: Vitals:   12/30/23 0039 12/30/23 0407 12/30/23 0753 12/30/23 0824  BP:  (!) 172/87 (!) 143/67   Pulse: 77 80 65   Resp: 20 16 20    Temp:  97.9 F (36.6 C)    TempSrc:  Axillary    SpO2: 90% 96% 95% 93%  Weight:        Intake/Output Summary (Last 24 hours) at 12/30/2023 0930 Last data filed at 12/29/2023 2319 Gross per 24 hour  Intake 351.04 ml  Output --  Net 351.04 ml   Filed Weights   12/29/23 2319  Weight: 96.4 kg    Examination:  General exam: Appears calm and comfortable, hard of hearing Respiratory system: Clear to auscultation. Respiratory effort normal. Cardiovascular system: S1 & S2 heard, RRR.  Gastrointestinal system: Abdomen is soft Central nervous system: Alert and awake Extremities: No edema Skin: No significant lesions noted Psychiatry: Flat affect.    Data Reviewed: I have personally reviewed following labs  and imaging studies  CBC: Recent Labs  Lab 12/29/23 1942 12/30/23 0452  WBC 15.6* 12.5*  HGB 15.0 14.7  HCT 43.8 43.0  MCV 93.8 94.3  PLT 186 163   Basic Metabolic Panel: Recent Labs  Lab 12/29/23 1942 12/30/23 0452  NA 137 137  K 3.8 4.1  CL 99 100  CO2 27 24  GLUCOSE 218* 297*  BUN 18 19  CREATININE 0.97 0.90  CALCIUM  9.6 9.3  MG  --  1.6*   GFR: Estimated Creatinine Clearance: 70.4 mL/min (by C-G formula based on SCr of 0.9 mg/dL). Liver Function Tests: No results for input(s): AST, ALT, ALKPHOS, BILITOT, PROT, ALBUMIN in the last 168 hours. No results for input(s): LIPASE, AMYLASE in the last 168 hours. No results for input(s): AMMONIA in the last 168 hours. Coagulation Profile: No results for input(s): INR, PROTIME in the last 168 hours. Cardiac Enzymes: No results for input(s): CKTOTAL, CKMB, CKMBINDEX, TROPONINI in the last 168 hours. BNP (last 3 results) No results for input(s): PROBNP in the last 8760 hours. HbA1C: No results for input(s): HGBA1C in the last 72 hours. CBG: Recent Labs  Lab 12/29/23 2226  GLUCAP 215*   Lipid Profile: No results for input(s): CHOL, HDL, LDLCALC, TRIG, CHOLHDL, LDLDIRECT in the last 72 hours. Thyroid  Function Tests: No results for input(s): TSH, T4TOTAL, FREET4, T3FREE, THYROIDAB in the last 72 hours. Anemia Panel: No results for input(s): VITAMINB12, FOLATE, FERRITIN, TIBC, IRON, RETICCTPCT in the last 72 hours. Sepsis Labs: Recent Labs  Lab 12/30/23 0452  PROCALCITON <0.10    Recent Results (from the past 240 hours)  Resp panel by RT-PCR (RSV, Flu A&B, Covid) Anterior Nasal Swab     Status: None   Collection Time: 12/29/23  9:02 PM   Specimen: Anterior Nasal Swab  Result Value Ref Range Status   SARS Coronavirus 2 by RT PCR NEGATIVE NEGATIVE Final    Comment: (NOTE) SARS-CoV-2 target nucleic acids are NOT DETECTED.  The SARS-CoV-2 RNA is  generally detectable in upper respiratory specimens during the acute phase of infection. The lowest concentration of SARS-CoV-2 viral copies this assay can detect is 138 copies/mL. A negative result does not preclude SARS-Cov-2 infection and should not be used as the sole basis for treatment or other patient management decisions. A negative result may occur with  improper specimen collection/handling, submission of specimen other than nasopharyngeal swab, presence of viral mutation(s) within the areas targeted by this assay, and inadequate number of viral copies(<138 copies/mL). A negative result must be combined with clinical observations, patient history, and epidemiological information. The expected result is Negative.  Fact Sheet for Patients:  BloggerCourse.com  Fact Sheet for Healthcare Providers:  SeriousBroker.it  This test is no t yet approved or cleared by the [place]  FDA and  has been authorized for detection and/or diagnosis  of SARS-CoV-2 by FDA under an Emergency Use Authorization (EUA). This EUA will remain  in effect (meaning this test can be used) for the duration of the COVID-19 declaration under Section 564(b)(1) of the Act, 21 U.S.C.section 360bbb-3(b)(1), unless the authorization is terminated  or revoked sooner.       Influenza A by PCR NEGATIVE NEGATIVE Final   Influenza B by PCR NEGATIVE NEGATIVE Final    Comment: (NOTE) The Xpert Xpress SARS-CoV-2/FLU/RSV plus assay is intended as an aid in the diagnosis of influenza from Nasopharyngeal swab specimens and should not be used as a sole basis for treatment. Nasal washings and aspirates are unacceptable for Xpert Xpress SARS-CoV-2/FLU/RSV testing.  Fact Sheet for Patients: BloggerCourse.com  Fact Sheet for Healthcare Providers: SeriousBroker.it  This test is not yet approved or cleared by the [place] FDA and has been authorized for detection and/or diagnosis of SARS-CoV-2 by FDA under an Emergency Use Authorization (EUA). This EUA will remain in effect (meaning this test can be used) for the duration of the COVID-19 declaration under Section 564(b)(1) of the Act, 21 U.S.C. section 360bbb-3(b)(1), unless the authorization is terminated or revoked.     Resp Syncytial Virus by PCR NEGATIVE NEGATIVE Final    Comment: (NOTE) Fact Sheet for Patients: BloggerCourse.com  Fact Sheet for Healthcare Providers: SeriousBroker.it  This test is not yet approved or cleared by the [place]  FDA and has been authorized for detection and/or diagnosis of SARS-CoV-2 by FDA under an Emergency Use Authorization (EUA). This EUA will remain in effect (meaning this test can be used) for the duration of the COVID-19 declaration under Section 564(b)(1) of the Act, 21 U.S.C. section 360bbb-3(b)(1), unless the authorization is terminated or revoked.  Performed at [facility], [address] [place], [place] [address]          Radiology Studies: DG Chest 2 View Result Date: 12/29/2023 CLINICAL DATA:  Chest pain EXAM: CHEST - 2 VIEW COMPARISON:  Chest radiograph dated 10/23/2023 FINDINGS: Normal lung volumes. Increased bilateral peripheral patchy opacities. No pleural effusion or pneumothorax. Similar enlarged cardiomediastinal silhouette. No acute osseous abnormality. IMPRESSION: Increased bilateral peripheral patchy opacities, which may represent atypical infection. Electronically Signed   By: Limin  Xu M.D.   On: 12/29/2023 20:07        Scheduled Meds:  allopurinol   300 mg Oral Daily   amLODipine   10 mg Oral Daily   apixaban   5 mg Oral BID   atorvastatin   80 mg Oral Daily   azithromycin   500 mg Oral Daily   clopidogrel   75 mg Oral Daily   famotidine   20 mg Oral BID   insulin  aspart  0-15 Units Subcutaneous TID WC   insulin  aspart   0-5 Units Subcutaneous QHS   insulin  aspart  3 Units Subcutaneous TID WC   ipratropium-albuterol   3 mL Nebulization TID   losartan   75 mg Oral Daily   memantine   5 mg Oral BID   methylPREDNISolone  (SOLU-MEDROL ) injection  40 mg Intravenous Q12H   Followed by   NOREEN ON 12/31/2023] predniSONE   40 mg Oral Q breakfast   potassium chloride   10 mEq Oral Daily   sertraline   50 mg Oral Daily   sodium chloride  flush  3 mL Intravenous Q12H   tamsulosin   0.4 mg Oral QPC supper   torsemide   20 mg Oral Daily   Continuous Infusions:  cefTRIAXone  (ROCEPHIN )  IV     magnesium  sulfate bolus IVPB 2 g (12/30/23 0914)  LOS: 1 day    Time spent: 55 minutes    Geramy Lamorte JONETTA Fairly, DO [facility]  If 7PM-7AM, please contact night-coverage [email or web address] 12/30/2023, 9:30 AM

## 2023-12-30 NOTE — TOC Initial Note (Signed)
 Transition of Care [facility]) - Initial/Assessment Note    Patient Details  Name: Kevin Frank MRN: [id number] Date of Birth: 09-15-1940  [facility] CM/SW Contact:    Noreen KATHEE Pinal, LCSWA Phone Number: 12/30/2023, 9:54 AM  Clinical Narrative:                  Patient is at risk for readmission due to high admission score; Patient was admitted for Pneumonia. CSW spoke with daughter Olivia. Olivia shared that patient lives in the home with spouse. Daughter states that her and her brother goes over to check in on them daily. Daughter shared that patient has a CPAP machine a home, a cane, and a shower bench. Daughter states that patient can still drive , but is cautious ,  if he does not feel like he is fine to drive he will not. Daughter also shared that they have a sensor to check O2 and his BP. TOC to follow.   Expected Discharge Plan: Home/Self Care Barriers to Discharge: Continued Medical Work up   Patient Goals and CMS Choice Patient states their goals for this hospitalization and ongoing recovery are:: return back home CMS Medicare.gov Compare Post Acute Care list provided to:: Patient Represenative (must comment) (Daughter Olivia) Choice offered to / list presented to : Adult Children      Expected Discharge Plan and Services In-house Referral: Clinical Social Work   Post Acute Care Choice: Durable Medical Equipment Living arrangements for the past 2 months: Single Family Home                                      Prior Living Arrangements/Services Living arrangements for the past 2 months: Single Family Home Lives with:: Spouse Patient language and need for interpreter reviewed:: Yes Do you feel safe going back to the place where you live?: Yes      Need for Family Participation in Patient Care: Yes (Comment) Care giver support system in place?: Yes (comment) Current home services: DME Criminal Activity/Legal Involvement Pertinent to Current  Situation/Hospitalization: No - Comment as needed  Activities of Daily Living   ADL Screening (condition at time of admission) Independently performs ADLs?: Yes (appropriate for developmental age) Is the patient deaf or have difficulty hearing?: Yes Does the patient have difficulty seeing, even when wearing glasses/contacts?: No Does the patient have difficulty concentrating, remembering, or making decisions?: Yes  Permission Sought/Granted      Share Information with NAME: Olivia     Permission granted to share info w Relationship: Daughter     Emotional Assessment Appearance:: Appears stated age     Orientation: : Oriented to Self Alcohol / Substance Use: Tobacco Use Psych Involvement: No (comment)  Admission diagnosis:  Acute and chronic respiratory failure with hypoxia (HCC) [J96.21] Community acquired pneumonia, unspecified laterality [J18.9] Patient Active Problem List   Diagnosis Date Noted   Acute and chronic respiratory failure with hypoxia (HCC) 12/29/2023   Pneumonia 12/29/2023   Acute respiratory failure with hypoxia (HCC) 10/23/2023   Urinary retention 08/11/2023   Chronic heart failure with preserved ejection fraction (HFpEF) (HCC) 08/11/2023   S/P right coronary artery (RCA) stent placement 08/10/2023   Urinary tract infection without hematuria 08/10/2023   Atrial flutter (HCC) 08/10/2023   Paroxysmal atrial fibrillation (HCC) 08/10/2023   Transient alteration of awareness 08/10/2023   Hypokalemia 08/10/2023   Cigarette smoker 08/10/2023  Atherosclerosis of native coronary artery of native heart without angina pectoris 08/10/2023   STEMI (ST elevation myocardial infarction) (HCC) 08/04/2023   Chronic gouty arthritis 07/14/2022   Polyarthralgia 07/14/2022   Coronary artery calcification 12/09/2021   COPD with acute exacerbation (HCC) 08/18/2021   Cough 07/18/2021   Asthma with COPD with exacerbation (HCC) 07/18/2021   Lobar pneumonia (HCC) 12/11/2020    Acute respiratory failure (HCC) 12/10/2020   Wrist pain, acute, right 09/07/2019   Right forearm pain 09/07/2019   Uncontrolled type 2 diabetes mellitus with hyperglycemia (HCC) 06/27/2019   Mixed hyperlipidemia 06/27/2019   Class 1 obesity due to excess calories with serious comorbidity and body mass index (BMI) of 32.0 to 32.9 in adult 06/27/2019   Diabetes mellitus without complication (HCC) 06/22/2019   Positive colorectal cancer screening using Cologuard test 06/14/2019   Squamous cell carcinoma of right lung (HCC) 03/29/2018   Seasonal and perennial allergic rhinitis 09/22/2017   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   Obstructive sleep apnea on CPAP 04/23/2017   Chronic knee pain 04/23/2017   AKI (acute kidney injury) (HCC) 04/23/2017   Chronic heart failure with preserved ejection fraction (HCC) 04/23/2017   Cellulitis 04/23/2017   Cellulitis of left lower leg    Current smoker 03/05/2016   Incidental lung nodule, > 3mm and < 8mm 03/05/2016   Stage 2 moderate COPD by GOLD classification (HCC) 03/05/2016   AAA (abdominal aortic aneurysm) without rupture (HCC) 06/14/2015   Tobacco use 06/09/2007   OBSTRUCTIVE SLEEP APNEA 06/09/2007   HYPERCHOLESTEROLEMIA 05/06/2007   GOUT 05/06/2007   DEPRESSION 05/06/2007   Essential hypertension 05/06/2007   Aneurysm of abdominal vessel (HCC) 05/06/2007   BENIGN PROSTATIC HYPERTROPHY, HX OF 05/06/2007   PCP:  Bertell Satterfield, MD Pharmacy:   [facility] - [place], [place] - [address] [place] [address] Phone: [phone] Fax: [phone]  [facility] - [place], [place] - [address] [place] [address] Phone: [phone] Fax: [phone]     Social Drivers of Health (SDOH) Social History: SDOH Screenings   Food Insecurity: No Food Insecurity (12/30/2023)  Housing: Low Risk  (12/30/2023)  Transportation Needs: No Transportation Needs  (12/30/2023)  Utilities: Not At Risk (12/30/2023)  Depression (PHQ2-9): Low Risk  (09/07/2019)  Financial Resource Strain: Low Risk  (03/05/2022)  Social Connections: Socially Integrated (12/30/2023)  Tobacco Use: High Risk (12/29/2023)   SDOH Interventions:     Readmission Risk Interventions    12/30/2023    9:38 AM 10/26/2023   10:03 AM 10/23/2023    9:53 PM  Readmission Risk Prevention Plan  Transportation Screening Complete Complete Complete  PCP or Specialist Appt within 5-7 Days   Complete  Home Care Screening   Complete  Medication Review (RN CM)   Complete  HRI or Home Care Consult Complete Complete   Social Work Consult for Recovery Care Planning/Counseling Complete Complete   Palliative Care Screening Not Applicable Not Applicable   Medication Review Oceanographer) Complete Complete

## 2023-12-30 NOTE — Plan of Care (Signed)

## 2023-12-31 DIAGNOSIS — J189 Pneumonia, unspecified organism: Secondary | ICD-10-CM | POA: Diagnosis not present

## 2023-12-31 LAB — BASIC METABOLIC PANEL WITH GFR
Anion gap: 12 (ref 5–15)
BUN: 26 mg/dL — ABNORMAL HIGH (ref 8–23)
CO2: 25 mmol/L (ref 22–32)
Calcium: 9 mg/dL (ref 8.9–10.3)
Chloride: 99 mmol/L (ref 98–111)
Creatinine, Ser: 1.09 mg/dL (ref 0.61–1.24)
GFR, Estimated: >60 mL/min (ref >60)
Glucose, Bld: 281 mg/dL — ABNORMAL HIGH (ref 70–99)
Potassium: 4.4 mmol/L (ref 3.5–5.1)
Sodium: 136 mmol/L (ref 135–145)

## 2023-12-31 LAB — CBC
HCT: 40.1 % (ref 39.0–52.0)
Hemoglobin: 13.7 g/dL (ref 13.0–17.0)
MCH: 32.3 pg (ref 26.0–34.0)
MCHC: 34.2 g/dL (ref 30.0–36.0)
MCV: 94.6 fL (ref 80.0–100.0)
Platelets: 181 K/uL (ref 150–400)
RBC: 4.24 MIL/uL (ref 4.22–5.81)
RDW: 15.8 % — ABNORMAL HIGH (ref 11.5–15.5)
WBC: 17.5 K/uL — ABNORMAL HIGH (ref 4.0–10.5)
nRBC: 0 % (ref 0.0–0.2)

## 2023-12-31 LAB — GLUCOSE, CAPILLARY
Glucose-Capillary: 295 mg/dL — ABNORMAL HIGH (ref 70–99)
Glucose-Capillary: 323 mg/dL — ABNORMAL HIGH (ref 70–99)

## 2023-12-31 LAB — STREP PNEUMONIAE URINARY ANTIGEN: Strep Pneumo Urinary Antigen: NEGATIVE

## 2023-12-31 LAB — MAGNESIUM: Magnesium: 1.9 mg/dL (ref 1.7–2.4)

## 2023-12-31 LAB — PROCALCITONIN: Procalcitonin: <0.1 ng/mL

## 2023-12-31 MED ORDER — AZITHROMYCIN 500 MG PO TABS: 500.0000 mg | ORAL_TABLET | Freq: Every day | ORAL | 0 refills | Status: AC

## 2023-12-31 MED ORDER — PREDNISONE 20 MG PO TABS: 40.0000 mg | ORAL_TABLET | Freq: Every day | ORAL | 0 refills | Status: AC

## 2023-12-31 MED ORDER — GUAIFENESIN-DM 100-10 MG/5ML PO SYRP: 10.0000 mL | ORAL_SOLUTION | ORAL | 0 refills | Status: DC | PRN

## 2023-12-31 NOTE — Discharge Summary (Signed)
 Physician Discharge Summary  Kevin Frank FMW:[id number] DOB: 06/12/1941 DOA: 12/29/2023  PCP: Bertell Satterfield, MD  Admit date: 12/29/2023  Discharge date: 12/31/2023  Admitted From:Home  Disposition:  Home  Recommendations for Outpatient Follow-up:  Follow up with PCP in 1-2 weeks Remain on azithromycin  and prednisone  as prescribed for 3 more days to complete course of treatment Use home inhalers as needed for shortness of breath or wheezing Robitussin prescribed as needed for cough Continue other home medications as prior  Home Health: None  Equipment/Devices: None  Discharge Condition:Stable  CODE STATUS: Full  Diet recommendation: Heart Healthy/carb modified  Brief/Interim Summary: Kevin Frank is a 83 y.o. male with medical history significant for COPD/asthma overlap, chronic hypoxic respiratory failure, OSA on CPAP, CAD, PAF on Eliquis , hypertension, type 2 diabetes mellitus, and reported dementia who presents with fatigue, loss of appetite, and increased cough.  Patient was admitted with acute on chronic hypoxemic respiratory failure in the setting of pneumonia with acute COPD exacerbation.  Patient was started on IV antibiotics as well as Solu-Medrol  and breathing treatments with overall improvement in condition noted.  He has ambulated and his oxygen  saturations remained above 88% and he does not have any significant symptomatology.  Respiratory panel negative and procalcitonin low.  He will remain on azithromycin  as well as prednisone  as prescribed to complete 3-day course of treatment.  No other acute events or concerns noted.  Discharge Diagnoses:  Principal Problem:   Pneumonia Active Problems:   OBSTRUCTIVE SLEEP APNEA   Essential hypertension   Uncontrolled type 2 diabetes mellitus with hyperglycemia (HCC)   COPD with acute exacerbation (HCC)   Paroxysmal atrial fibrillation (HCC)   Chronic heart failure with preserved ejection fraction (HFpEF) (HCC)   Acute  and chronic respiratory failure with hypoxia (HCC)  Principal discharge diagnosis: Acute hypoxemic respiratory failure secondary to COPD exacerbation in the setting of possible community-acquired pneumonia.  Discharge Instructions  Discharge Instructions     Diet - low sodium heart healthy   Complete by: As directed    Increase activity slowly   Complete by: As directed       Allergies as of 12/31/2023       Reactions   Amiodarone  Hives, Rash        Medication List     TAKE these medications    albuterol  (2.5 MG/3ML) 0.083% nebulizer solution Commonly known as: PROVENTIL  Take 3 mLs (2.5 mg total) by nebulization every 4 (four) hours as needed for wheezing or shortness of breath.   albuterol  108 (90 Base) MCG/ACT inhaler Commonly known as: VENTOLIN  HFA Inhale 2 puffs into the lungs every 4 (four) hours as needed for wheezing or shortness of breath (cough).   allopurinol  300 MG tablet Commonly known as: ZYLOPRIM  Take 1 tablet (300 mg total) by mouth daily.   amLODipine  10 MG tablet Commonly known as: NORVASC  Take 10 mg by mouth daily.   apixaban  5 MG Tabs tablet Commonly known as: ELIQUIS  Take 1 tablet (5 mg total) by mouth 2 (two) times daily.   atorvastatin  80 MG tablet Commonly known as: LIPITOR Take 1 tablet (80 mg total) by mouth daily.   azithromycin  500 MG tablet Commonly known as: ZITHROMAX  Take 1 tablet (500 mg total) by mouth daily for 3 days.   clopidogrel  75 MG tablet Commonly known as: PLAVIX  Take 1 tablet (75 mg total) by mouth daily.   EPINEPHrine  0.3 mg/0.3 mL Soaj injection Commonly known as: EPI-PEN Inject 0.3 mg into the muscle  as needed for anaphylaxis.   famotidine  20 MG tablet Commonly known as: PEPCID  Take 1 tablet (20 mg total) by mouth 2 (two) times daily.   guaiFENesin  600 MG 12 hr tablet Commonly known as: Mucinex  Take 1 tablet (600 mg total) by mouth 2 (two) times daily.   guaiFENesin -dextromethorphan  100-10 MG/5ML  syrup Commonly known as: ROBITUSSIN DM Take 10 mLs by mouth every 4 (four) hours as needed for cough.   losartan  50 MG tablet Commonly known as: COZAAR  Take 1.5 tablets (75 mg total) by mouth daily.   memantine  5 MG tablet Commonly known as: NAMENDA  Take 5 mg by mouth 2 (two) times daily.   metFORMIN  500 MG tablet Commonly known as: GLUCOPHAGE  Take 2 tablets (1,000 mg total) by mouth 2 (two) times daily.   metoprolol  succinate 50 MG 24 hr tablet Commonly known as: TOPROL -XL Take 1 tablet (50 mg total) by mouth 2 (two) times daily. Take with or immediately following a meal.   Neuriva Plus Caps Take 1 tablet by mouth daily.   nicotine  21 mg/24hr patch Commonly known as: NICODERM CQ  - dosed in mg/24 hours Place 1 patch (21 mg total) onto the skin daily.   nitroGLYCERIN  0.4 MG SL tablet Commonly known as: NITROSTAT  Place 1 tablet (0.4 mg total) under the tongue every 5 (five) minutes as needed for chest pain.   potassium chloride  10 MEQ tablet Commonly known as: KLOR-CON  Take 1 tablet (10 mEq total) by mouth daily. Take While taking Demadex /Torsemide    predniSONE  20 MG tablet Commonly known as: DELTASONE  Take 20 mg by mouth daily with breakfast. What changed: Another medication with the same name was added. Make sure you understand how and when to take each.   predniSONE  20 MG tablet Commonly known as: DELTASONE  Take 2 tablets (40 mg total) by mouth daily with breakfast for 3 days. Start taking on: January 01, 2024 What changed: You were already taking a medication with the same name, and this prescription was added. Make sure you understand how and when to take each.   sertraline  50 MG tablet Commonly known as: ZOLOFT  Take 1 tablet (50 mg total) by mouth daily.   tamsulosin  0.4 MG Caps capsule Commonly known as: FLOMAX  Take 1 capsule (0.4 mg total) by mouth daily after supper.   torsemide  20 MG tablet Commonly known as: DEMADEX  Take 1 tablet (20 mg total) by mouth  daily.   Trelegy Ellipta  100-62.5-25 MCG/ACT Aepb Generic drug: Fluticasone -Umeclidin-Vilant Inhale 1 puff into the lungs daily.        Follow-up Information     Bertell Satterfield, MD. Schedule an appointment as soon as possible for a visit in 1 week(s).   Specialty: Internal Medicine Contact information: [address] [place] [address] [phone]                Allergies  Allergen Reactions   Amiodarone  Hives and Rash    Consultations: None   Procedures/Studies: DG Chest 2 View Result Date: 12/29/2023 CLINICAL DATA:  Chest pain EXAM: CHEST - 2 VIEW COMPARISON:  Chest radiograph dated 10/23/2023 FINDINGS: Normal lung volumes. Increased bilateral peripheral patchy opacities. No pleural effusion or pneumothorax. Similar enlarged cardiomediastinal silhouette. No acute osseous abnormality. IMPRESSION: Increased bilateral peripheral patchy opacities, which may represent atypical infection. Electronically Signed   By: Limin  Xu M.D.   On: 12/29/2023 20:07     Discharge Exam: Vitals:   12/31/23 0414 12/31/23 0757  BP: (!) 157/67   Pulse: 86  Resp: (!) 21   Temp: 97.7 F (36.5 C)   SpO2: 94% 94%   Vitals:   12/30/23 2213 12/30/23 2227 12/31/23 0414 12/31/23 0757  BP: 109/71  (!) 157/67   Pulse: 87 76 86   Resp: 20 17 (!) 21   Temp: 97.7 F (36.5 C)  97.7 F (36.5 C)   TempSrc: Oral  Oral   SpO2: 91% 92% 94% 94%  Weight:        General: Pt is alert, awake, not in acute distress Cardiovascular: RRR, S1/S2 +, no rubs, no gallops Respiratory: CTA bilaterally, no wheezing, no rhonchi Abdominal: Soft, NT, ND, bowel sounds + Extremities: no edema, no cyanosis    The results of significant diagnostics from this hospitalization (including imaging, microbiology, ancillary and laboratory) are listed below for reference.     Microbiology: Recent Results (from the past 240 hours)  Resp panel by RT-PCR (RSV, Flu A&B, Covid) Anterior Nasal Swab      Status: None   Collection Time: 12/29/23  9:02 PM   Specimen: Anterior Nasal Swab  Result Value Ref Range Status   SARS Coronavirus 2 by RT PCR NEGATIVE NEGATIVE Final    Comment: (NOTE) SARS-CoV-2 target nucleic acids are NOT DETECTED.  The SARS-CoV-2 RNA is generally detectable in upper respiratory specimens during the acute phase of infection. The lowest concentration of SARS-CoV-2 viral copies this assay can detect is 138 copies/mL. A negative result does not preclude SARS-Cov-2 infection and should not be used as the sole basis for treatment or other patient management decisions. A negative result may occur with  improper specimen collection/handling, submission of specimen other than nasopharyngeal swab, presence of viral mutation(s) within the areas targeted by this assay, and inadequate number of viral copies(<138 copies/mL). A negative result must be combined with clinical observations, patient history, and epidemiological information. The expected result is Negative.  Fact Sheet for Patients:  BloggerCourse.com  Fact Sheet for Healthcare Providers:  SeriousBroker.it  This test is no t yet approved or cleared by the [place]  FDA and  has been authorized for detection and/or diagnosis of SARS-CoV-2 by FDA under an Emergency Use Authorization (EUA). This EUA will remain  in effect (meaning this test can be used) for the duration of the COVID-19 declaration under Section 564(b)(1) of the Act, 21 U.S.C.section 360bbb-3(b)(1), unless the authorization is terminated  or revoked sooner.       Influenza A by PCR NEGATIVE NEGATIVE Final   Influenza B by PCR NEGATIVE NEGATIVE Final    Comment: (NOTE) The Xpert Xpress SARS-CoV-2/FLU/RSV plus assay is intended as an aid in the diagnosis of influenza from Nasopharyngeal swab specimens and should not be used as a sole basis for treatment. Nasal washings and aspirates are  unacceptable for Xpert Xpress SARS-CoV-2/FLU/RSV testing.  Fact Sheet for Patients: BloggerCourse.com  Fact Sheet for Healthcare Providers: SeriousBroker.it  This test is not yet approved or cleared by the [place]  FDA and has been authorized for detection and/or diagnosis of SARS-CoV-2 by FDA under an Emergency Use Authorization (EUA). This EUA will remain in effect (meaning this test can be used) for the duration of the COVID-19 declaration under Section 564(b)(1) of the Act, 21 U.S.C. section 360bbb-3(b)(1), unless the authorization is terminated or revoked.     Resp Syncytial Virus by PCR NEGATIVE NEGATIVE Final    Comment: (NOTE) Fact Sheet for Patients: BloggerCourse.com  Fact Sheet for Healthcare Providers: SeriousBroker.it  This test is not yet approved or cleared by the  [place]  FDA and has been authorized for detection and/or diagnosis of SARS-CoV-2 by FDA under an Emergency Use Authorization (EUA). This EUA will remain in effect (meaning this test can be used) for the duration of the COVID-19 declaration under Section 564(b)(1) of the Act, 21 U.S.C. section 360bbb-3(b)(1), unless the authorization is terminated or revoked.  Performed at [facility], [address], [place], [place] [address]   Respiratory (~20 pathogens) panel by PCR     Status: None   Collection Time: 12/30/23  7:04 AM   Specimen: Nasopharyngeal Swab; Respiratory  Result Value Ref Range Status   Adenovirus NOT DETECTED NOT DETECTED Final   Coronavirus 229E NOT DETECTED NOT DETECTED Final    Comment: (NOTE) The Coronavirus on the Respiratory Panel, DOES NOT test for the novel  Coronavirus (2019 nCoV)    Coronavirus HKU1 NOT DETECTED NOT DETECTED Final   Coronavirus NL63 NOT DETECTED NOT DETECTED Final   Coronavirus OC43 NOT DETECTED NOT DETECTED Final   Metapneumovirus NOT DETECTED  NOT DETECTED Final   Rhinovirus / Enterovirus NOT DETECTED NOT DETECTED Final   Influenza A NOT DETECTED NOT DETECTED Final   Influenza B NOT DETECTED NOT DETECTED Final   Parainfluenza Virus 1 NOT DETECTED NOT DETECTED Final   Parainfluenza Virus 2 NOT DETECTED NOT DETECTED Final   Parainfluenza Virus 3 NOT DETECTED NOT DETECTED Final   Parainfluenza Virus 4 NOT DETECTED NOT DETECTED Final   Respiratory Syncytial Virus NOT DETECTED NOT DETECTED Final   Bordetella pertussis NOT DETECTED NOT DETECTED Final   Bordetella Parapertussis NOT DETECTED NOT DETECTED Final   Chlamydophila pneumoniae NOT DETECTED NOT DETECTED Final   Mycoplasma pneumoniae NOT DETECTED NOT DETECTED Final    Comment: Performed at [facility], [address], [place], [place] [address]     Labs: BNP (last 3 results) Recent Labs    10/23/23 1346  BNP 362.0*   Basic Metabolic Panel: Recent Labs  Lab 12/29/23 1942 12/30/23 0452 12/30/23 1302 12/30/23 1701 12/31/23 0450  NA 137 137  --   --  136  K 3.8 4.1  --   --  4.4  CL 99 100  --   --  99  CO2 27 24  --   --  25  GLUCOSE 218* 297* 406* 451* 281*  BUN 18 19  --   --  26*  CREATININE 0.97 0.90  --   --  1.09  CALCIUM  9.6 9.3  --   --  9.0  MG  --  1.6*  --   --  1.9   Liver Function Tests: No results for input(s): AST, ALT, ALKPHOS, BILITOT, PROT, ALBUMIN in the last 168 hours. No results for input(s): LIPASE, AMYLASE in the last 168 hours. No results for input(s): AMMONIA in the last 168 hours. CBC: Recent Labs  Lab 12/29/23 1942 12/30/23 0452 12/31/23 0450  WBC 15.6* 12.5* 17.5*  HGB 15.0 14.7 13.7  HCT 43.8 43.0 40.1  MCV 93.8 94.3 94.6  PLT 186 163 181   Cardiac Enzymes: No results for input(s): CKTOTAL, CKMB, CKMBINDEX, TROPONINI in the last 168 hours. BNP: Invalid input(s): POCBNP CBG: Recent Labs  Lab 12/29/23 2226 12/30/23 1244 12/30/23 1613 12/30/23 2209 12/31/23 0722  GLUCAP 215* 407*  409* 164* 295*   D-Dimer No results for input(s): DDIMER in the last 72 hours. Hgb A1c No results for input(s): HGBA1C in the last 72 hours. Lipid Profile No results for input(s): CHOL, HDL, LDLCALC, TRIG,  CHOLHDL, LDLDIRECT in the last 72 hours. Thyroid  function studies No results for input(s): TSH, T4TOTAL, T3FREE, THYROIDAB in the last 72 hours.  Invalid input(s): FREET3 Anemia work up No results for input(s): VITAMINB12, FOLATE, FERRITIN, TIBC, IRON, RETICCTPCT in the last 72 hours. Urinalysis    Component Value Date/Time   COLORURINE YELLOW 08/07/2023 1922   APPEARANCEUR HAZY (A) 08/07/2023 1922   APPEARANCEUR Clear 07/18/2021 1010   LABSPEC 1.021 08/07/2023 1922   PHURINE 7.0 08/07/2023 1922   GLUCOSEU >=500 (A) 08/07/2023 1922   HGBUR SMALL (A) 08/07/2023 1922   BILIRUBINUR NEGATIVE 08/07/2023 1922   BILIRUBINUR Negative 07/18/2021 1010   KETONESUR NEGATIVE 08/07/2023 1922   PROTEINUR 30 (A) 08/07/2023 1922   NITRITE POSITIVE (A) 08/07/2023 1922   LEUKOCYTESUR MODERATE (A) 08/07/2023 1922   Sepsis Labs Recent Labs  Lab 12/29/23 1942 12/30/23 0452 12/31/23 0450  WBC 15.6* 12.5* 17.5*   Microbiology Recent Results (from the past 240 hours)  Resp panel by RT-PCR (RSV, Flu A&B, Covid) Anterior Nasal Swab     Status: None   Collection Time: 12/29/23  9:02 PM   Specimen: Anterior Nasal Swab  Result Value Ref Range Status   SARS Coronavirus 2 by RT PCR NEGATIVE NEGATIVE Final    Comment: (NOTE) SARS-CoV-2 target nucleic acids are NOT DETECTED.  The SARS-CoV-2 RNA is generally detectable in upper respiratory specimens during the acute phase of infection. The lowest concentration of SARS-CoV-2 viral copies this assay can detect is 138 copies/mL. A negative result does not preclude SARS-Cov-2 infection and should not be used as the sole basis for treatment or other patient management decisions. A negative result may occur with   improper specimen collection/handling, submission of specimen other than nasopharyngeal swab, presence of viral mutation(s) within the areas targeted by this assay, and inadequate number of viral copies(<138 copies/mL). A negative result must be combined with clinical observations, patient history, and epidemiological information. The expected result is Negative.  Fact Sheet for Patients:  BloggerCourse.com  Fact Sheet for Healthcare Providers:  SeriousBroker.it  This test is no t yet approved or cleared by the [place]  FDA and  has been authorized for detection and/or diagnosis of SARS-CoV-2 by FDA under an Emergency Use Authorization (EUA). This EUA will remain  in effect (meaning this test can be used) for the duration of the COVID-19 declaration under Section 564(b)(1) of the Act, 21 U.S.C.section 360bbb-3(b)(1), unless the authorization is terminated  or revoked sooner.       Influenza A by PCR NEGATIVE NEGATIVE Final   Influenza B by PCR NEGATIVE NEGATIVE Final    Comment: (NOTE) The Xpert Xpress SARS-CoV-2/FLU/RSV plus assay is intended as an aid in the diagnosis of influenza from Nasopharyngeal swab specimens and should not be used as a sole basis for treatment. Nasal washings and aspirates are unacceptable for Xpert Xpress SARS-CoV-2/FLU/RSV testing.  Fact Sheet for Patients: BloggerCourse.com  Fact Sheet for Healthcare Providers: SeriousBroker.it  This test is not yet approved or cleared by the [place]  FDA and has been authorized for detection and/or diagnosis of SARS-CoV-2 by FDA under an Emergency Use Authorization (EUA). This EUA will remain in effect (meaning this test can be used) for the duration of the COVID-19 declaration under Section 564(b)(1) of the Act, 21 U.S.C. section 360bbb-3(b)(1), unless the authorization is terminated or revoked.      Resp Syncytial Virus by PCR NEGATIVE NEGATIVE Final    Comment: (NOTE) Fact Sheet for Patients: BloggerCourse.com  Fact Sheet for  Healthcare Providers: SeriousBroker.it  This test is not yet approved or cleared by the [place]  FDA and has been authorized for detection and/or diagnosis of SARS-CoV-2 by FDA under an Emergency Use Authorization (EUA). This EUA will remain in effect (meaning this test can be used) for the duration of the COVID-19 declaration under Section 564(b)(1) of the Act, 21 U.S.C. section 360bbb-3(b)(1), unless the authorization is terminated or revoked.  Performed at [facility], [address], [place], [place] [address]   Respiratory (~20 pathogens) panel by PCR     Status: None   Collection Time: 12/30/23  7:04 AM   Specimen: Nasopharyngeal Swab; Respiratory  Result Value Ref Range Status   Adenovirus NOT DETECTED NOT DETECTED Final   Coronavirus 229E NOT DETECTED NOT DETECTED Final    Comment: (NOTE) The Coronavirus on the Respiratory Panel, DOES NOT test for the novel  Coronavirus (2019 nCoV)    Coronavirus HKU1 NOT DETECTED NOT DETECTED Final   Coronavirus NL63 NOT DETECTED NOT DETECTED Final   Coronavirus OC43 NOT DETECTED NOT DETECTED Final   Metapneumovirus NOT DETECTED NOT DETECTED Final   Rhinovirus / Enterovirus NOT DETECTED NOT DETECTED Final   Influenza A NOT DETECTED NOT DETECTED Final   Influenza B NOT DETECTED NOT DETECTED Final   Parainfluenza Virus 1 NOT DETECTED NOT DETECTED Final   Parainfluenza Virus 2 NOT DETECTED NOT DETECTED Final   Parainfluenza Virus 3 NOT DETECTED NOT DETECTED Final   Parainfluenza Virus 4 NOT DETECTED NOT DETECTED Final   Respiratory Syncytial Virus NOT DETECTED NOT DETECTED Final   Bordetella pertussis NOT DETECTED NOT DETECTED Final   Bordetella Parapertussis NOT DETECTED NOT DETECTED Final   Chlamydophila pneumoniae NOT DETECTED NOT DETECTED Final    Mycoplasma pneumoniae NOT DETECTED NOT DETECTED Final    Comment: Performed at [facility], [address], [place], [place] [address]     Time coordinating discharge: 35 minutes  SIGNED:   Adron JONETTA Fairly, DO [facility] 12/31/2023, 10:20 AM  If 7PM-7AM, please contact night-coverage [email or web address]

## 2023-12-31 NOTE — Telephone Encounter (Signed)
 Wife Nadia) notified - stated he is currently at [facility] with pneumonia.

## 2024-01-01 ENCOUNTER — Telehealth: Payer: Self-pay

## 2024-01-01 NOTE — Transitions of Care (Post Inpatient/ED Visit) (Signed)
 01/01/2024  Name: Kevin Frank MRN: [id number] DOB: 1941/05/04  Today's TOC FU Call Status: Today's TOC FU Call Status:: Successful TOC FU Call Completed TOC FU Call Complete Date: 01/01/24 Patient's Name and Date of Birth confirmed.  Transition Care Management Follow-up Telephone Call Date of Discharge: 12/31/23 Dis[facility] Type of Discharge: Inpatient Admission Primary Inpatient Discharge Diagnosis:: CAP How have you been since you were released from the hospital?: Better Any questions or concerns?: No  Items Reviewed: Did you receive and understand the discharge instructions provided?: Yes Medications obtained,verified, and reconciled?: Yes (Medications Reviewed) Any new allergies since your discharge?: No Dietary orders reviewed?: Yes Type of Diet Ordered:: Low Sodium Heart Healhty Do you have support at home?: Yes People in Home [RPT]: spouse Name of Support/Comfort Primary Source: Niels Qua  Medications Reviewed Today: Medications Reviewed Today     Reviewed by Moises Reusing, RN (Case Manager) on 01/01/24 at 1158  Med List Status: <None>   Medication Order Taking? Sig Documenting Provider Last Dose Status Informant  albuterol  (PROVENTIL ) (2.5 MG/3ML) 0.083% nebulizer solution [id number]  Take 3 mLs (2.5 mg total) by nebulization every 4 (four) hours as needed for wheezing or shortness of breath. Pearlean Manus, MD  Active Spouse/Significant Other, Child, Pharmacy Records  albuterol  (VENTOLIN  HFA) 108 [phone] Base) MCG/ACT inhaler [id number]  Inhale 2 puffs into the lungs every 4 (four) hours as needed for wheezing or shortness of breath (cough). Pearlean Manus, MD  Active Spouse/Significant Other, Child, Pharmacy Records  allopurinol  (ZYLOPRIM ) 300 MG tablet [id number]  Take 1 tablet (300 mg total) by mouth daily. Corum, Olam CROME, MD  Active Child, Spouse/Significant Other, Pharmacy Records  amLODipine  (NORVASC ) 10 MG tablet [id number]  Take  10 mg by mouth daily. [provider]  Active Spouse/Significant Other, Child, Pharmacy Records           Med Note DRENA, CHUCK KANDICE Debar Dec 29, 2023  9:21 PM) Pt's daughter and spouse are both unsure about this medication, saw it was prescribed by Dr Marvine but pt's family does not recall it  apixaban  (ELIQUIS ) 5 MG TABS tablet [id number]  Take 1 tablet (5 mg total) by mouth 2 (two) times daily. Miriam Norris, NP  Active Spouse/Significant Other, Child, Pharmacy Records  atorvastatin  (LIPITOR) 80 MG tablet [id number]  Take 1 tablet (80 mg total) by mouth daily. Miriam Norris, NP  Active Spouse/Significant Other, Child, Pharmacy Records  azithromycin  (ZITHROMAX ) 500 MG tablet [id number]  Take 1 tablet (500 mg total) by mouth daily for 3 days. Maree, Pratik D, DO  Active   clopidogrel  (PLAVIX ) 75 MG tablet [id number]  Take 1 tablet (75 mg total) by mouth daily. Miriam Norris, NP  Active Spouse/Significant Other, Child, Pharmacy Records  Cobalamin Combinations (NEURIVA PLUS) CAPS [id number]  Take 1 tablet by mouth daily. [provider]  Active Spouse/Significant Other, Child, Pharmacy Records  EPINEPHrine  0.3 mg/0.3 mL IJ SOAJ injection [id number]  Inject 0.3 mg into the muscle as needed for anaphylaxis. Pearlean Manus, MD  Active Spouse/Significant Other, Child, Pharmacy Records  famotidine  (PEPCID ) 20 MG tablet [id number]  Take 1 tablet (20 mg total) by mouth 2 (two) times daily. Pearlean Manus, MD  Active Spouse/Significant Other, Child, Pharmacy Records  Fluticasone -Umeclidin-Vilant (TRELEGY ELLIPTA ) 100-62.5-25 MCG/ACT AEPB [id number]  Inhale 1 puff into the lungs daily. Pearlean Manus, MD  Active Spouse/Significant Other, Child, Pharmacy Records  guaiFENesin  (MUCINEX ) 600 MG 12 hr tablet [id number]  Take 1 tablet (600 mg total) by  mouth 2 (two) times daily. Pearlean Manus, MD  Active Spouse/Significant Other, Child, Pharmacy Records  guaiFENesin -dextromethorphan  (ROBITUSSIN  DM) 100-10 MG/5ML syrup [id number]  Take 10 mLs by mouth every 4 (four) hours as needed for cough. Maree, Pratik D, DO  Active   losartan  (COZAAR ) 50 MG tablet [id number]  Take 1.5 tablets (75 mg total) by mouth daily. Miriam Norris, NP  Active Spouse/Significant Other, Child, Pharmacy Records  memantine  (NAMENDA ) 5 MG tablet [id number]  Take 5 mg by mouth 2 (two) times daily. [provider]  Active Spouse/Significant Other, Child, Pharmacy Records  metFORMIN  (GLUCOPHAGE ) 500 MG tablet [id number]  Take 2 tablets (1,000 mg total) by mouth 2 (two) times daily. Pearlean Manus, MD  Active Spouse/Significant Other, Child, Pharmacy Records  metoprolol  succinate (TOPROL -XL) 50 MG 24 hr tablet [id number]  Take 1 tablet (50 mg total) by mouth 2 (two) times daily. Take with or immediately following a meal. Emokpae, Courage, MD  Active Spouse/Significant Other, Child, Pharmacy Records  nicotine  (NICODERM CQ  - DOSED IN MG/24 HOURS) 21 mg/24hr patch [id number]  Place 1 patch (21 mg total) onto the skin daily.  Patient not taking: Reported on 01/01/2024   Pearlean Manus, MD  Active Spouse/Significant Other, Child, Pharmacy Records           Med Note DRENA, CHUCK KANDICE Debar Dec 29, 2023  9:21 PM) Pt smokes instead of using patch  nitroGLYCERIN  (NITROSTAT ) 0.4 MG SL tablet [id number]  Place 1 tablet (0.4 mg total) under the tongue every 5 (five) minutes as needed for chest pain. Madie Jon Garre, PA  Active Spouse/Significant Other, Child, Pharmacy Records  potassium chloride  (KLOR-CON ) 10 MEQ tablet [id number]  Take 1 tablet (10 mEq total) by mouth daily. Take While taking Demadex /Torsemide  Pearlean Manus, MD  Active Spouse/Significant Other, Child, Pharmacy Records  predniSONE  (DELTASONE ) 20 MG tablet [id number]  Take 20 mg by mouth daily with breakfast. [provider]  Active Spouse/Significant Other, Child, Pharmacy Records  predniSONE  (DELTASONE ) 20 MG tablet [id number]  Take 2 tablets (40 mg  total) by mouth daily with breakfast for 3 days. Maree, Pratik D, DO  Active   sertraline  (ZOLOFT ) 50 MG tablet [id number]  Take 1 tablet (50 mg total) by mouth daily. Pearlean Manus, MD  Active Spouse/Significant Other, Child, Pharmacy Records  tamsulosin  (FLOMAX ) 0.4 MG CAPS capsule [id number]  Take 1 capsule (0.4 mg total) by mouth daily after supper. Pearlean Manus, MD  Active Spouse/Significant Other, Child, Pharmacy Records  tezepelumab -ekko (TEZSPIRE ) 210 MG/1. syringe 210 mg [id number]   Iva Marty Saltness, MD  Active   torsemide  (DEMADEX ) 20 MG tablet [id number]  Take 1 tablet (20 mg total) by mouth daily. Pearlean Manus, MD  Active Spouse/Significant Other, Child, Pharmacy Records  Med List Note (Ward, Angelica, CPhT 12/29/23 2105): CPAP at bedtime Send [facility] term meds to [facility] and send short term meds to [facility] and Equipment/Supplies: Were Home Health Services Ordered?: No Any new equipment or medical supplies ordered?: No  Functional Questionnaire: Do you need assistance with bathing/showering or dressing?: No Do you need assistance with meal preparation?: No Do you need assistance with eating?: No Do you have difficulty maintaining continence: No Do you need assistance with getting out of bed/getting out of a chair/moving?: No Do you have difficulty managing or taking your medications?: No  Follow up appointments reviewed: PCP Follow-up appointment confirmed?: No (The patient is calling today to schedule) MD  Provider Line [phone] Given: No Specialist Hospital Follow-up appointment confirmed?: Yes Date of Specialist follow-up appointment?: 01/21/24 Follow-Up Specialty Provider:: Ozell America Do you need transportation to your follow-up appointment?: No Do you understand care options if your condition(s) worsen?: Yes-patient verbalized understanding  SDOH Interventions Today    Flowsheet Row Most Recent Value  SDOH  Interventions   Food Insecurity Interventions Intervention Not Indicated  Housing Interventions Intervention Not Indicated  Transportation Interventions Intervention Not Indicated  Utilities Interventions Intervention Not Indicated    Medford Balboa, BSN, RN [place]  VBC[facility] - Population Health RN Care Manager [phone]

## 2024-01-02 LAB — LEGIONELLA PNEUMOPHILA SEROGP 1 UR AG: L. pneumophila Serogp 1 Ur Ag: NEGATIVE

## 2024-01-05 DIAGNOSIS — M2042 Other hammer toe(s) (acquired), left foot: Secondary | ICD-10-CM | POA: Diagnosis not present

## 2024-01-05 DIAGNOSIS — E119 Type 2 diabetes mellitus without complications: Secondary | ICD-10-CM | POA: Diagnosis not present

## 2024-01-05 DIAGNOSIS — M2011 Hallux valgus (acquired), right foot: Secondary | ICD-10-CM | POA: Diagnosis not present

## 2024-01-05 DIAGNOSIS — L600 Ingrowing nail: Secondary | ICD-10-CM | POA: Diagnosis not present

## 2024-01-18 ENCOUNTER — Ambulatory Visit (INDEPENDENT_AMBULATORY_CARE_PROVIDER_SITE_OTHER)

## 2024-01-18 DIAGNOSIS — J4550 Severe persistent asthma, uncomplicated: Secondary | ICD-10-CM | POA: Diagnosis not present

## 2024-01-19 NOTE — Progress Notes (Unsigned)
 Kevin Frank, male    DOB: Jan 05, 1941    MRN: [id number]   Brief patient profile:  78  yowm  active smoker  former Sood Pt self-referred back to pulmonary clinic in [place]  01/21/2024  for GOLD 3 copd    PFT 04/07/18 >> FEV1 2.25 (75%), ratio  0.52, TLC 8.47 (120%), RV 4.27 (164%), DLCO 47%, +BD Spirometry 11/03/22 >> FEV1 1.28 (45.39%), ratio 0.52  CT chest 11/08/22 >> atherosclerosis, mod emphysema with bronchial thickening, RUL XRT changes, stable nodules     History of Present Illness  01/21/2024  Pulmonary/ 1st office eval/ Kevin Frank / [place] [facility] Trelegy  Chief Complaint  Patient presents with   Establish Care  Dyspnea:  walks across the parking lot most days for bfast - otherwise very inactive the rest of the day  Cough: smoker's rattle but doesn't bother him / no significant production  Sleep: flat bed/ two pillows  SABA use: hfa once day  02 ldz:gldu at hs with cpap ? 5 lpm 02       No obvious day to day or daytime pattern/variability or assoc  mucus plugs or hemoptysis or cp or chest tightness, subjective wheeze or overt sinus or hb symptoms.    Also denies any obvious fluctuation of symptoms with weather or environmental changes or other aggravating or alleviating factors except as outlined above   No unusual exposure hx or h/o childhood pna/ asthma or knowledge of premature birth.  Current Allergies, Complete Past Medical History, Past Surgical History, Family History, and Social History were reviewed in [employer] record.  ROS  The following are not active complaints unless bolded Hoarseness, sore throat, dysphagia, dental problems, itching, sneezing,  nasal congestion or discharge of excess mucus or purulent secretions, ear ache,   fever, chills, sweats, unintended wt loss or wt gain, classically pleuritic or exertional cp,  orthopnea pnd or arm/hand swelling  or leg swelling, presyncope, palpitations, abdominal pain, anorexia, nausea,  vomiting, diarrhea  or change in bowel habits or change in bladder habits, change in stools or change in urine, dysuria, hematuria,  rash, arthralgias, visual complaints, headache, numbness, weakness or ataxia or problems with walking or coordination,  change in mood or  memory.            Outpatient Medications Prior to Visit  Medication Sig Dispense Refill   albuterol  (VENTOLIN  HFA) 108 (90 Base) MCG/ACT inhaler Inhale 2 puffs into the lungs every 4 (four) hours as needed for wheezing or shortness of breath (cough). 18 g 2   allopurinol  (ZYLOPRIM ) 300 MG tablet Take 1 tablet (300 mg total) by mouth daily. 30 tablet 1   apixaban  (ELIQUIS ) 5 MG TABS tablet Take 1 tablet (5 mg total) by mouth 2 (two) times daily. 180 tablet 1   atorvastatin  (LIPITOR) 80 MG tablet Take 1 tablet (80 mg total) by mouth daily. 90 tablet 1   clopidogrel  (PLAVIX ) 75 MG tablet Take 1 tablet (75 mg total) by mouth daily. 90 tablet 1   famotidine  (PEPCID ) 20 MG tablet Take 1 tablet (20 mg total) by mouth 2 (two) times daily. 60 tablet 3   Fluticasone -Umeclidin-Vilant (TRELEGY ELLIPTA ) 100-62.5-25 MCG/ACT AEPB Inhale 1 puff into the lungs daily. 28 each 1   losartan  (COZAAR ) 50 MG tablet Take 1.5 tablets (75 mg total) by mouth daily. 45 tablet 5   memantine  (NAMENDA ) 5 MG tablet Take 5 mg by mouth 2 (two) times daily.     metFORMIN  (GLUCOPHAGE ) 500  MG tablet Take 2 tablets (1,000 mg total) by mouth 2 (two) times daily. 60 tablet 5   metoprolol  succinate (TOPROL -XL) 50 MG 24 hr tablet Take 1 tablet (50 mg total) by mouth 2 (two) times daily. Take with or immediately following a meal. 90 tablet 1   nitroGLYCERIN  (NITROSTAT ) 0.4 MG SL tablet Place 1 tablet (0.4 mg total) under the tongue every 5 (five) minutes as needed for chest pain. 100 tablet 3   tamsulosin  (FLOMAX ) 0.4 MG CAPS capsule Take 1 capsule (0.4 mg total) by mouth daily after supper. 90 capsule 3   torsemide  (DEMADEX ) 20 MG tablet Take 1 tablet (20 mg total) by  mouth daily. 90 tablet 2   albuterol  (PROVENTIL ) (2.5 MG/3ML) 0.083% nebulizer solution Take 3 mLs (2.5 mg total) by nebulization every 4 (four) hours as needed for wheezing or shortness of breath. (Patient not taking: Reported on 01/21/2024) 150 mL 2   amLODipine  (NORVASC ) 10 MG tablet Take 10 mg by mouth daily. (Patient not taking: Reported on 01/21/2024)     Cobalamin Combinations (NEURIVA PLUS) CAPS Take 1 tablet by mouth daily. (Patient not taking: Reported on 01/21/2024)     EPINEPHrine  0.3 mg/0.3 mL IJ SOAJ injection Inject 0.3 mg into the muscle as needed for anaphylaxis. (Patient not taking: Reported on 01/21/2024) 1 each 1   guaiFENesin  (MUCINEX ) 600 MG 12 hr tablet Take 1 tablet (600 mg total) by mouth 2 (two) times daily. (Patient not taking: Reported on 01/21/2024) 60 tablet 2   guaiFENesin -dextromethorphan  (ROBITUSSIN DM) 100-10 MG/5ML syrup Take 10 mLs by mouth every 4 (four) hours as needed for cough. (Patient not taking: Reported on 01/21/2024) 118 mL 0   nicotine  (NICODERM CQ  - DOSED IN MG/24 HOURS) 21 mg/24hr patch Place 1 patch (21 mg total) onto the skin daily. (Patient not taking: Reported on 01/21/2024) 28 patch 0   potassium chloride  (KLOR-CON ) 10 MEQ tablet Take 1 tablet (10 mEq total) by mouth daily. Take While taking Demadex /Torsemide  (Patient not taking: Reported on 01/21/2024) 30 tablet 1   predniSONE  (DELTASONE ) 20 MG tablet Take 20 mg by mouth daily with breakfast. (Patient not taking: Reported on 01/21/2024)     sertraline  (ZOLOFT ) 50 MG tablet Take 1 tablet (50 mg total) by mouth daily. (Patient not taking: Reported on 01/21/2024) 30 tablet 1   Facility-Administered Medications Prior to Visit  Medication Dose Route Frequency Provider Last Rate Last Admin   tezepelumab -ekko (TEZSPIRE ) 210 MG/1. 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    Normal nuclear stress test 2011   Obesity    Obstructive sleep apnea on CPAP    Presence of indwelling urinary catheter    Shortness of breath    due to cigarette abuse   Tobacco abuse       Objective:  BP (!) 161/81   Pulse 83   Ht 5' 7 (1.702 m)   Wt 214 lb 9.6 oz (97.3 kg)   SpO2 94% Comment: ra  BMI 33.61 kg/m   SpO2: 94 % (ra) amb wm/ rattling cough / very hoarse and hard of hearing    HEENT : Oropharynx  clear   Nasal turbinates nl    NECK :  without  apparent JVD/ palpable Nodes/TM    LUNGS: no acc muscle use,  Min barrel  contour chest wall with bilateral  slightly decreased bs s audible wheeze and  without cough on insp or exp maneuvers and min  Hyperresonant  to  percussion bilaterally    CV:  RRR  no s3 or murmur or increase in P2, and no edema   ABD:  obese soft and nontender with pos end  insp Hoover's  in the supine position.  No bruits or organomegaly appreciated   MS:  Nl gait/ ext warm without deformities Or obvious joint restrictions  calf tenderness, cyanosis or clubbing     SKIN: warm and dry without lesions    NEURO:  alert, approp, nl sensorium with  no motor or cerebellar deficits apparent.            Assessment   No problem-specific Assessment & Plan notes found for this encounter.     Ozell America, MD 01/21/2024

## 2024-01-21 ENCOUNTER — Ambulatory Visit: Admitting: Internal Medicine

## 2024-01-21 ENCOUNTER — Encounter: Payer: Self-pay | Admitting: Internal Medicine

## 2024-01-21 VITALS — BP 161/81 | HR 83 | Ht 67.0 in | Wt 214.6 lb

## 2024-01-21 DIAGNOSIS — J4489 Other specified chronic obstructive pulmonary disease: Secondary | ICD-10-CM | POA: Diagnosis not present

## 2024-01-21 DIAGNOSIS — F17210 Nicotine dependence, cigarettes, uncomplicated: Secondary | ICD-10-CM

## 2024-01-21 DIAGNOSIS — F17200 Nicotine dependence, unspecified, uncomplicated: Secondary | ICD-10-CM

## 2024-01-21 DIAGNOSIS — G4733 Obstructive sleep apnea (adult) (pediatric): Secondary | ICD-10-CM

## 2024-01-21 NOTE — Patient Instructions (Addendum)
 We will order you new equipment for cpap machine  Plan A = Automatic = Always=    Trelegy 100  one click each am   Plan B = Backup (to supplement plan A, not to replace it) Use your albuterol  inhaler as a rescue medication to be used if you can't catch your breath by resting or slowing your pace  or doing a relaxed purse lip breathing pattern.  - The less you use it, the better it will work when you need it. - Ok to use the inhaler up to 2 puffs  every 4 hours if you must but call for appointment if use goes up over your usual need - Don't leave home without it !!  (think of it like the spare tire or starter fluid for your car)   Plan C = Crisis (instead of Plan B but only if Plan B stops working) - only use your albuterol  nebulizer if you first try Plan B and it fails to help > ok to use the nebulizer up to every 4 hours but if start needing it regularly call for immediate appointment  Make sure you check your oxygen  saturation  AT  your highest level of activity (not after you stop)   to be sure it stays over 90% and adjust  02 flow upward to maintain this level if needed but remember to turn it back to previous settings when you stop.   Please schedule a follow up visit in 3 months but call sooner if needed to see NP

## 2024-01-22 ENCOUNTER — Encounter: Payer: Self-pay | Admitting: Internal Medicine

## 2024-01-22 NOTE — Assessment & Plan Note (Signed)
 Formerly pt of Dr Shellia on CPAP and 02  and tol both well though ? What flow 02 needed  >>>  Set up f/u with sleep medicine next

## 2024-01-22 NOTE — Assessment & Plan Note (Signed)
 Active smoker  - PFT's  04/07/18 FEV1 2.25 (75 % ) ratio 0.52  p 20 % improvement from saba p ? prior to study with DLCO  15.40 (47%) corrects to 2.38 (51%)  for alv volume and FV curve classically concave   - 01/04/2021  After extensive coaching inhaler device,  effectiveness =  90% with DPI > continue trelegy and approp saba  -  01/04/2021   Walked RA  3 laps @ approx 264ft each @ moderate pace  stopped due to end of study, sats 89% at end, no sob   Apparently  Group D (now reclassified as E) in terms of symptom/risk and laba/lama/ICS  therefore appropriate rx at this point >>>  continue Trelegy 100 and approp saba:  Re SABA :  I spent extra time with pt today reviewing appropriate use of albuterol  for prn use on exertion with the following points: 1) saba is for relief of sob that does not improve by walking a slower pace or resting but rather if the pt does not improve after trying this first. 2) If the pt is convinced, as many are, that saba helps recover from activity faster then it's easy to tell if this is the case by re-challenging : ie stop, take the inhaler, then p 5 minutes try the exact same activity (intensity of workload) that just caused the symptoms and see if they are substantially diminished or not after saba 3) if there is an activity that reproducibly causes the symptoms, try the saba 15 min before the activity on alternate days   If in fact the saba really does help, then fine to continue to use it prn but advised may need to look closer at the maintenance regimen being used (trelegy in this case)  to achieve better control of airways disease with exertion.

## 2024-01-22 NOTE — Assessment & Plan Note (Signed)
 Counseled re importance of smoking cessation but did not meet time criteria for separate billing          Each maintenance medication was reviewed in detail including emphasizing most importantly the difference between maintenance and prns and under what circumstances the prns are to be triggered using an action plan format where appropriate.  Total time for H and P, chart review, counseling, reviewing ndpi/ neb/ 02/pulse ox device(s) , directly observing portions of ambulatory 02 saturation study/ and generating customized AVS unique to this office visit / same day charting = 44 min with pt not seen by me in 3 y

## 2024-02-05 ENCOUNTER — Other Ambulatory Visit: Payer: Self-pay | Admitting: *Deleted

## 2024-02-05 MED ORDER — TEZSPIRE 210 MG/1.91ML ~~LOC~~ SOAJ: 210.0000 mg | SUBCUTANEOUS | 11 refills | Status: AC

## 2024-02-11 ENCOUNTER — Other Ambulatory Visit: Payer: Self-pay | Admitting: Nurse Practitioner

## 2024-02-12 MED ORDER — LOSARTAN POTASSIUM 50 MG PO TABS: 75.0000 mg | ORAL_TABLET | Freq: Every day | ORAL | 11 refills | Status: DC

## 2024-02-12 NOTE — Progress Notes (Signed)
 Erroneous encounter

## 2024-02-15 ENCOUNTER — Ambulatory Visit

## 2024-02-15 ENCOUNTER — Other Ambulatory Visit: Payer: Self-pay

## 2024-02-15 MED ORDER — LOSARTAN POTASSIUM 50 MG PO TABS
75.0000 mg | ORAL_TABLET | Freq: Every day | ORAL | 2 refills | Status: DC
Start: 2024-02-15 — End: 2024-02-29

## 2024-02-18 ENCOUNTER — Encounter: Payer: Self-pay | Admitting: Neurology

## 2024-02-18 ENCOUNTER — Ambulatory Visit: Admitting: Neurology

## 2024-02-18 VITALS — BP 126/78 | HR 47

## 2024-02-18 DIAGNOSIS — R4189 Other symptoms and signs involving cognitive functions and awareness: Secondary | ICD-10-CM

## 2024-02-18 DIAGNOSIS — Z720 Tobacco use: Secondary | ICD-10-CM | POA: Diagnosis not present

## 2024-02-18 NOTE — Progress Notes (Unsigned)
 [facility]  PATIENT: Kevin Frank DOB: Oct 25, 1940  REQUESTING CLINICIAN: Bertell Satterfield, MD HISTORY FROM: Patient/Spouse and son  REASON FOR VISIT: Cognitive changes    HISTORICAL  CHIEF COMPLAINT:  Chief Complaint  Patient presents with   New Patient (Initial Visit)    Rm 13, with wife Kevin Frank and sin Kevin Frank, difficulty since 3 cardiac stents placed earlier this year.    HISTORY OF PRESENT ILLNESS:  Discussed the use of AI scribe software for clinical note transcription with the patient, who gave verbal consent to proceed.  Kevin Frank is an 83 year old male with heart failure and recent stent placement in February, COPD, hypertension, hyperlipidemia, diabetes, cognitive impairment who presents with changes in mental status and memory issues. Since undergoing heart surgery in February, during which three stents were placed, he has experienced significant changes in his mental status. He sleeps most of the day and is less talkative than before the surgery. His family has noticed a decline in his short-term memory since the surgery. He has difficulty remembering recent events, such as misplacing car keys, but retains long-term memories well. 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  He has been prescribed memantine , starting with one pill and then increased to two, to address memory issues. However, there have been difficulties obtaining the medication from the pharmacy. His family reports that his short-term memory issues began after the surgery and have shown slight improvement over time, but remain a concern.  He has a history of heart failure and was recently noted to have a slow heart rate of 47 bpm. His medication regimen includes metoprolol , which was adjusted from 100 mg to 75 mg due to a shortage of the correct dosage. His family reports that he has been taking three 25 mg pills to make  up the 75 mg dose, but recently had to take a 100 mg pill due to running out of the 25 mg pills.  He has COPD and uses supplemental oxygen , particularly with a CPAP machine during sleep. His oxygen  levels have been stable recently, but he continues to smoke two packs of cigarettes a day.  He has hearing aids but does not wear them consistently, which may contribute to his memory issues as he sometimes does not hear conversations clearly. His last hearing test was in April, and his family has noticed a slight improvement when he wears the hearing aids.  His family did not report any periods of confusion or not knowing what is going on, except for the day of his heart attack, when he appeared pale and disoriented. His family has not noticed him repeating himself or asking the same question repeatedly.       OTHER MEDICAL CONDITIONS: Heart disease, Hypertension, lung disease, sleep apnea, Diabetes, Cognitive impairment    REVIEW OF SYSTEMS: Full 14 system review of systems performed and negative with exception of: As noted in the HPI   ALLERGIES: Allergies  Allergen Reactions   Amiodarone  Hives and Rash    HOME MEDICATIONS: Outpatient Medications Prior to Visit  Medication Sig Dispense Refill   albuterol  (PROVENTIL ) (2.5 MG/3ML) 0.083% nebulizer solution Take 3 mLs (2.5 mg total) by nebulization every 4 (four) hours as needed for wheezing or shortness of breath. 150 mL 2   albuterol  (VENTOLIN  HFA) 108 (90 Base) MCG/ACT inhaler Inhale 2 puffs into the lungs every 4 (four) hours as needed for wheezing or shortness of breath (cough). 18  g 2   allopurinol  (ZYLOPRIM ) 300 MG tablet Take 1 tablet (300 mg total) by mouth daily. 30 tablet 1   amLODipine  (NORVASC ) 10 MG tablet Take 10 mg by mouth daily.     apixaban  (ELIQUIS ) 5 MG TABS tablet Take 1 tablet (5 mg total) by mouth 2 (two) times daily. 180 tablet 1   atorvastatin  (LIPITOR) 80 MG tablet Take 1 tablet (80 mg total) by mouth daily. 90  tablet 1   clopidogrel  (PLAVIX ) 75 MG tablet Take 1 tablet (75 mg total) by mouth daily. 90 tablet 1   Cobalamin Combinations (NEURIVA PLUS) CAPS Take 1 tablet by mouth daily.     EPINEPHrine  0.3 mg/0.3 mL IJ SOAJ injection Inject 0.3 mg into the muscle as needed for anaphylaxis. 1 each 1   famotidine  (PEPCID ) 20 MG tablet Take 1 tablet (20 mg total) by mouth 2 (two) times daily. 60 tablet 3   Fluticasone -Umeclidin-Vilant (TRELEGY ELLIPTA ) 100-62.5-25 MCG/ACT AEPB Inhale 1 puff into the lungs daily. 28 each 1   guaiFENesin  (MUCINEX ) 600 MG 12 hr tablet Take 1 tablet (600 mg total) by mouth 2 (two) times daily. 60 tablet 2   guaiFENesin -dextromethorphan  (ROBITUSSIN DM) 100-10 MG/5ML syrup Take 10 mLs by mouth every 4 (four) hours as needed for cough. 118 mL 0   losartan  (COZAAR ) 50 MG tablet Take 1.5 tablets (75 mg total) by mouth daily. 135 tablet 2   memantine  (NAMENDA ) 5 MG tablet Take 5 mg by mouth 2 (two) times daily.     metFORMIN  (GLUCOPHAGE ) 500 MG tablet Take 2 tablets (1,000 mg total) by mouth 2 (two) times daily. 60 tablet 5   metoprolol  succinate (TOPROL -XL) 50 MG 24 hr tablet Take 1 tablet (50 mg total) by mouth 2 (two) times daily. Take with or immediately following a meal. (Patient taking differently: Take 75 mg by mouth 2 (two) times daily. Take with or immediately following a meal.) 90 tablet 1   nitroGLYCERIN  (NITROSTAT ) 0.4 MG SL tablet Place 1 tablet (0.4 mg total) under the tongue every 5 (five) minutes as needed for chest pain. 100 tablet 3   tamsulosin  (FLOMAX ) 0.4 MG CAPS capsule Take 1 capsule (0.4 mg total) by mouth daily after supper. 90 capsule 3   Tezepelumab -ekko (TEZSPIRE ) 210 MG/1. SOAJ Inject 210 mg into the skin every 28 (twenty-eight) days. 1.91 mL 11   torsemide  (DEMADEX ) 20 MG tablet Take 1 tablet (20 mg total) by mouth daily. 90 tablet 2   Facility-Administered Medications Prior to Visit  Medication Dose Route Frequency Provider Last Rate Last Admin    tezepelumab -ekko (TEZSPIRE ) 210 MG/1. 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   COLONOSCOPY  2006   2006: multiple diminutive polyps in rectum, one pedunculated polyp at 10 cm s/p snare removal. Left-sided diverticulum, pedunculated polyps at 35 cm and splenic flexure s/p snare removal. No path available.    COLONOSCOPY WITH PROPOFOL  N/A 11/24/2019   Procedure: COLONOSCOPY WITH PROPOFOL ;  Surgeon: Shaaron Lamar HERO, MD;  Location: [facility]ITE;  Service: Endoscopy;  Laterality: N/A;  9:45am   CORONARY/GRAFT ACUTE MI REVASCULARIZATION N/A 08/04/2023   Procedure: Coronary/Graft Acute MI Revascularization;  Surgeon: Wendel Lurena POUR, MD;  Location: [facility] INVASIVE CV LAB;  Service: Cardiovascular;  Laterality: N/A;   PERIPHERAL VASCULAR CATHETERIZATION N/A 04/10/2016    Procedure: Renal Angiography;  Surgeon: Lonni GORMAN Blade, MD;  Location: [facility] INVASIVE CV LAB;  Service: Cardiovascular;  Laterality: N/A;   POLYPECTOMY  11/24/2019   Procedure: POLYPECTOMY;  Surgeon: Shaaron Lamar HERO, MD;  Location: [facility];  Service: Endoscopy;;   TRANSURETHRAL RESECTION OF PROSTATE  01/07/2011   Procedure: TRANSURETHRAL RESECTION OF THE PROSTATE (TURP);  Surgeon: Mohammad I Javaid;  Location: [facility] ORS;  Service: Urology;  Laterality: N/A;    FAMILY HISTORY: Family History  Problem Relation Age of Onset   Liver disease Father    Lymphoma Mother    Diabetes Mother    Hypertension Mother    Cancer Mother    Mental retardation Other        sibling -- from birth    Asthma Daughter    Allergic rhinitis Neg Hx    Angioedema Neg Hx    Atopy Neg Hx    Eczema Neg Hx    Immunodeficiency Neg Hx    Colon cancer Neg Hx     SOCIAL HISTORY: Social History   Socioeconomic History   Marital status: Married    Spouse name: Niels   Number of children: 2   Years of education: Not on file   Highest education level: Not on file  Occupational History   Not on file  Tobacco Use   Smoking status: Every Day    Current packs/day: 2.00    Average packs/day: 2.0 packs/day for 50.0 years (100.0 ttl pk-yrs)    Types: Cigarettes    Passive exposure: Never   Smokeless tobacco: Never   Tobacco comments:    Smokes 2 packs a day [facility] 10/17/2021   Vaping Use   Vaping status: Never Used  Substance and Sexual Activity   Alcohol use: Yes    Alcohol/week: 0.0 standard drinks of alcohol    Comment: very little   Drug use: No   Sexual activity: Not on file  Other Topics Concern   Not on file  Social History Narrative   Lives at home with wife   Left handed   Previous farmer   Social Drivers of Health   Financial Resource Strain: Low Risk  (03/05/2022)   Overall Financial Resource Strain (CARDIA)    Difficulty of Paying Living Expenses: Not hard at all  Food Insecurity: No  Food  Insecurity (01/01/2024)   Hunger Vital Sign    Worried About Running Out of Food in the Last Year: Never true    Ran Out of Food in the Last Year: Never true  Transportation Needs: No Transportation Needs (01/01/2024)   PRAPARE - Administrator, Civil Service (Medical): No    Lack of Transportation (Non-Medical): No  Physical Activity: Not on file  Stress: Not on file  Social Connections: Socially Integrated (12/30/2023)   Social Connection and Isolation Panel    Frequency of Communication with Friends and Family: More than three times a week    Frequency of Social Gatherings with Friends and Family: More than three times a week    Attends Religious Services: More than 4 times per year    Active Member of [employer] or Organizations: No    Attends Engineer, structural: 1 to 4 times per year    Marital Status: Married  Catering manager Violence: Not At Risk (01/01/2024)   Humiliation, Afraid, Rape, and Kick questionnaire    Fear of Current or Ex-Partner: No    Emotionally Abused: No    Physically Abused: No    Sexually Abused: No    PHYSICAL EXAM  GENERAL EXAM/CONSTITUTIONAL: Vitals:  Vitals:   02/18/24 1414  BP: 126/78  Pulse: (!) 47  SpO2: 95%   There is no height or weight on file to calculate BMI. Wt Readings from Last 3 Encounters:  01/21/24 214 lb 9.6 oz (97.3 kg)  01/01/24 212 lb (96.2 kg)  12/29/23 212 lb 8.4 oz (96.4 kg)   Patient is in no distress; well developed, nourished and groomed; neck is supple  MUSCULOSKELETAL: Gait, strength, tone, movements noted in Neurologic exam below  NEUROLOGIC: MENTAL STATUS:     02/18/2024    2:23 PM  MMSE - Mini Mental State Exam  Orientation to time 1  Orientation to Place 5  Registration 3  Attention/ Calculation 1  Recall 0  Language- name 2 objects 2  Language- repeat 1  Language- follow 3 step command 3  Language- read & follow direction 1  Write a sentence 0  Copy design 1  Total score 18     CRANIAL NERVE:  2nd, 3rd, 4th, 6th - Visual fields full to confrontation, extraocular muscles intact, no nystagmus 5th - facial sensation symmetric 7th - facial strength symmetric 8th - hearing intact 9th - palate elevates symmetrically, uvula midline 11th - shoulder shrug symmetric 12th - tongue protrusion midline  MOTOR:  normal bulk and tone, full strength in the BUE, BLE  SENSORY:  normal and symmetric to light touch  COORDINATION:  finger-nose-finger, fine finger movements normal  GAIT/STATION:  normal    DIAGNOSTIC DATA (LABS, IMAGING, TESTING) - I reviewed patient records, labs, notes, testing and imaging myself where available.  Lab Results  Component Value Date   WBC 17.5 (H) 12/31/2023   HGB 13.7 12/31/2023   HCT 40.1 12/31/2023   MCV 94.6 12/31/2023   PLT 181 12/31/2023      Component Value Date/Time   NA 136 12/31/2023 0450   NA 141 03/18/2017 1042   K 4.4 12/31/2023 0450   CL 99 12/31/2023 0450   CO2 25 12/31/2023 0450   GLUCOSE 281 (H) 12/31/2023 0450   BUN 26 (H) 12/31/2023 0450   BUN 21 03/23/2019 0000   CREATININE 1.09 12/31/2023 0450   CREATININE 0.98 02/08/2016 1300   CALCIUM  9.0 12/31/2023 0450   PROT 6.5 08/08/2023 1030  ALBUMIN 3.0 (L) 08/08/2023 1030   AST 18 08/08/2023 1030   ALT 24 08/08/2023 1030   ALKPHOS 59 08/08/2023 1030   BILITOT 0.7 08/08/2023 1030   GFRNONAA >60 12/31/2023 0450   GFRAA >60 11/22/2019 1531   Lab Results  Component Value Date   CHOL 82 08/04/2023   HDL 30 (L) 08/04/2023   LDLCALC 34 08/04/2023   TRIG 92 08/04/2023   CHOLHDL 2.7 08/04/2023   Lab Results  Component Value Date   HGBA1C 8.4 (H) 08/04/2023   No results found for: VITAMINB12 Lab Results  Component Value Date   TSH 0.378 08/07/2023    ASSESSMENT AND PLAN  83 y.o. year old male with   Assessment and Plan    Cognitive impairment  Cognitive impairment, worse after cardiac surgery with stent placement in February, but  improving per family; presenting with excessive daytime sleepiness, reduced verbal communication, and significant short-term memory deficits. Long-term memory remains intact. Symptoms have slightly improved but remain concerning. Differential diagnosis includes post-operative cognitive dysfunction and potential intraoperative stroke. Anesthesia and age may have contributed to cognitive changes. - Order MRI of the brain to rule out stroke during cardiac procedure. - Continue with Memantine    Bradycardia Bradycardia with a heart rate of 47 bpm, no prior history. Recent metoprolol  dosage changes due to supply issues. Current regimen includes 100 mg metoprolol , potentially contributing to bradycardia. Risk of dizziness and falls due to low heart rate. - Instruct to cut 100 mg metoprolol  tablet in half to achieve 50 mg dose until 75 mg tablets are available. - Monitor heart rate and ensure it remains above 60 bpm to prevent dizziness and falls.  Coronary artery disease, status post stent placement Status post stent placement for coronary artery disease following myocardial infarction. Current management includes metoprolol .  Chronic obstructive pulmonary disease (COPD) COPD with supplemental oxygen  use. Oxygen  is used with CPAP during sleep, but compliance is suboptimal due to smoking habits.  Tobacco use disorder Continues to smoke two packs per day despite COPD diagnosis and supplemental oxygen  use. No reduction in smoking reported.  Hearing loss, suboptimal hearing aid use Hearing loss with inconsistent use of hearing aids due to discomfort and maintenance issues. Suboptimal use may contribute to communication difficulties and perceived memory issues. - Encourage consistent use of hearing aids to improve communication and memory retention.     1. Cognitive impairment     There are no Patient Instructions on file for this visit.  Orders Placed This Encounter  Procedures   MR BRAIN WO  CONTRAST    No orders of the defined types were placed in this encounter.   Return if symptoms worsen or fail to improve.  I have spent a total of 65 minutes dedicated to this patient today, preparing to see patient, performing a medically appropriate examination and evaluation, ordering tests and/or medications and procedures, and counseling and educating the patient/family/caregiver; independently interpreting result and communicating results to the family/patient/caregiver; and documenting clinical information in the electronic medical record.   Pastor Falling, MD 02/19/2024, 9:39 AM  [facility] [address] [place], [place] [address] [phone]

## 2024-02-19 ENCOUNTER — Ambulatory Visit (INDEPENDENT_AMBULATORY_CARE_PROVIDER_SITE_OTHER)

## 2024-02-19 DIAGNOSIS — J4550 Severe persistent asthma, uncomplicated: Secondary | ICD-10-CM | POA: Diagnosis not present

## 2024-02-24 ENCOUNTER — Telehealth: Payer: Self-pay | Admitting: Neurology

## 2024-02-24 NOTE — Telephone Encounter (Signed)
 MRI orders sent to [facility] to schedule. They left the patient a voice mail on each phone number today. [phone] Mylene barrows: [id number] exp. 02/24/24-04/24/24

## 2024-02-29 ENCOUNTER — Ambulatory Visit: Admitting: Cardiology

## 2024-02-29 ENCOUNTER — Ambulatory Visit: Attending: Cardiology | Admitting: Cardiology

## 2024-02-29 ENCOUNTER — Encounter: Payer: Self-pay | Admitting: Cardiology

## 2024-02-29 VITALS — BP 160/80 | HR 57 | Ht 70.0 in | Wt 217.6 lb

## 2024-02-29 DIAGNOSIS — I714 Abdominal aortic aneurysm, without rupture, unspecified: Secondary | ICD-10-CM | POA: Diagnosis not present

## 2024-02-29 DIAGNOSIS — Z8679 Personal history of other diseases of the circulatory system: Secondary | ICD-10-CM

## 2024-02-29 DIAGNOSIS — Z9889 Other specified postprocedural states: Secondary | ICD-10-CM | POA: Diagnosis not present

## 2024-02-29 DIAGNOSIS — I5032 Chronic diastolic (congestive) heart failure: Secondary | ICD-10-CM | POA: Diagnosis not present

## 2024-02-29 DIAGNOSIS — I2510 Atherosclerotic heart disease of native coronary artery without angina pectoris: Secondary | ICD-10-CM

## 2024-02-29 MED ORDER — LOSARTAN POTASSIUM 100 MG PO TABS: 100.0000 mg | ORAL_TABLET | Freq: Every day | ORAL | 1 refills | Status: DC

## 2024-02-29 NOTE — Progress Notes (Signed)
 Clinical Summary Kevin Frank is a 83 y.o.male  Chronic HFpEF -10/2019 echo: LVEF 60-65%, grade I dd, mild pulm HTN - 07/2023 echo: LVEF 60-65%, no WMAs, grade II dd, mild MR - mild LE edema times. Some SOB, cough that can be productive , wheezing.  - compliant with meds     2. HTN -compliant with meds      3. HLD - Jan 2024 TC 149 TG 136 HDL 50 LDL 75 - 07/2023 TC 82 TG 92 HDL 30 LDL 34   4. CAD - admit 07/2023 with STEMI, received DES x 3 to RCA - 07/2023 echo: LVEF 60-65%, no WMAs, grade II dd, mild MR - no chest pains.     5. Afib/aflutter - diagnosed during 07/2023 admission - started on amio however rash and facial swelling, was stopped - no recent palpitations - no bleeding on eliquis    5. DM2     6. 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 History of lung CA - followed by oncology     8. AAA s/p repair - followed by vascular - overdue for f/u with vascular   9. 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 150 mL 2   albuterol  (VENTOLIN  HFA) 108 (90 Base) MCG/ACT inhaler Inhale 2 puffs into the lungs every 4 (four) hours as needed for wheezing or shortness of breath (cough). 18 g 2   allopurinol  (ZYLOPRIM ) 300 MG tablet Take 1 tablet (300 mg total) by mouth daily. 30 tablet 1   amLODipine  (NORVASC ) 10 MG tablet Take 10 mg by mouth daily.     apixaban  (ELIQUIS ) 5 MG TABS tablet Take 1 tablet (5 mg total) by mouth 2 (two) times daily. 180 tablet 1   atorvastatin  (LIPITOR) 80 MG tablet Take 1 tablet (80 mg total) by mouth daily. 90 tablet 1   clopidogrel  (PLAVIX ) 75 MG tablet Take 1 tablet (75 mg total) by mouth daily. 90 tablet 1   Cobalamin Combinations (NEURIVA PLUS) CAPS Take 1 tablet by mouth daily.     EPINEPHrine  0.3 mg/0.3 mL IJ SOAJ injection Inject 0.3 mg into the muscle as needed for anaphylaxis. 1 each 1   famotidine  (PEPCID ) 20 MG tablet Take 1 tablet (20 mg total) by mouth 2 (two) times  daily. 60 tablet 3   Fluticasone -Umeclidin-Vilant (TRELEGY ELLIPTA ) 100-62.5-25 MCG/ACT AEPB Inhale 1 puff into the lungs daily. 28 each 1   guaiFENesin  (MUCINEX ) 600 MG 12 hr tablet Take 1 tablet (600 mg total) by mouth 2 (two) times daily. 60 tablet 2   guaiFENesin -dextromethorphan  (ROBITUSSIN DM) 100-10 MG/5ML syrup Take 10 mLs by mouth every 4 (four) hours as needed for cough. 118 mL 0   memantine  (NAMENDA ) 5 MG tablet Take 5 mg by mouth 2 (two) times daily.     metFORMIN  (GLUCOPHAGE ) 500 MG tablet Take 2 tablets (1,000 mg total) by mouth 2 (two) times daily. 60 tablet 5   metoprolol  succinate (TOPROL -XL) 50 MG 24 hr tablet Take 1 tablet (50 mg total) by mouth 2 (two) times daily. Take with or immediately following a meal. (Patient taking differently: Take 75 mg by mouth 2 (two) times daily. Take with or immediately following a meal.) 90 tablet 1   nitroGLYCERIN  (NITROSTAT ) 0.4 MG SL tablet Place 1 tablet (0.4 mg total) under the tongue every 5 (five) minutes as needed for chest pain.  100 tablet 3   sertraline  (ZOLOFT ) 50 MG tablet Take 50 mg by mouth daily.     tamsulosin  (FLOMAX ) 0.4 MG CAPS capsule Take 1 capsule (0.4 mg total) by mouth daily after supper. 90 capsule 3   Tezepelumab -ekko (TEZSPIRE ) 210 MG/1. SOAJ Inject 210 mg into the skin every 28 (twenty-eight) days. 1.91 mL 11   torsemide  (DEMADEX ) 20 MG tablet Take 1 tablet (20 mg total) by mouth daily. 90 tablet 2   losartan  (COZAAR ) 100 MG tablet Take 1 tablet (100 mg total) by mouth daily. 90 tablet 1   Current Facility-Administered Medications  Medication Dose Route Frequency Provider Last Rate Last Admin   tezepelumab -ekko (TEZSPIRE ) 210 MG/1. syringe 210 mg  210 mg Subcutaneous Q28 days Iva Marty Saltness, MD   210 mg at 02/19/24 1348     Past Surgical History:  Procedure Laterality Date   ABDOMINAL AORTIC ENDOVASCULAR FENESTRATED STENT GRAFT N/A 04/09/2016   Procedure: ABDOMINAL AORTIC ENDOVASCULAR FENESTRATED STENT GRAFT with left brachial artery access using ultrasound guidance;  Surgeon: Gaile LELON New, MD;  Location: [facility] OR;  Service: Vascular;  Laterality: N/A;   CHOLECYSTECTOMY  1997   [facility]   COLONOSCOPY  2006   2006: multiple diminutive polyps in rectum, one pedunculated polyp at 10 cm s/p snare removal. Left-sided diverticulum, pedunculated polyps at 35 cm and splenic flexure s/p snare removal. No path available.    COLONOSCOPY WITH PROPOFOL  N/A 11/24/2019   Procedure: COLONOSCOPY WITH PROPOFOL ;  Surgeon: Shaaron Lamar HERO, MD;  Location: [facility] SUITE;  Service: Endoscopy;  Laterality: N/A;  9:45am   CORONARY/GRAFT ACUTE MI REVASCULARIZATION N/A 08/04/2023   Procedure: Coronary/Graft Acute MI Revascularization;  Surgeon: Wendel Lurena POUR, MD;  Location: [facility] INVASIVE CV LAB;  Service: Cardiovascular;  Laterality: N/A;   PERIPHERAL VASCULAR CATHETERIZATION N/A 04/10/2016   Procedure: Renal Angiography;  Surgeon: Lonni GORMAN Blade, MD;  Location: [facility] INVASIVE CV LAB;  Service: Cardiovascular;   Laterality: N/A;   POLYPECTOMY  11/24/2019   Procedure: POLYPECTOMY;  Surgeon: Shaaron Lamar HERO, MD;  Location: [facility] SUITE;  Service: Endoscopy;;   TRANSURETHRAL RESECTION OF PROSTATE  01/07/2011   Procedure: TRANSURETHRAL RESECTION OF THE PROSTATE (TURP);  Surgeon: Mohammad I Javaid;  Location: [facility] ORS;  Service: Urology;  Laterality: N/A;     Allergies  Allergen Reactions   Amiodarone  Hives and Rash      Family History  Problem Relation Age of Onset   Liver disease Father    Lymphoma Mother    Diabetes Mother    Hypertension Mother    Cancer Mother    Mental retardation Other        sibling -- from birth    Asthma Daughter    Allergic rhinitis Neg Hx    Angioedema Neg Hx    Atopy Neg Hx    Eczema Neg Hx    Immunodeficiency Neg Hx    Colon cancer Neg Hx      Social History Mr. Dredge reports that he has been smoking cigarettes. He has a 100 pack-year smoking history. He has never been exposed to tobacco smoke. He has never used smokeless tobacco. Mr. Trigueros reports current alcohol use.    Physical Examination Today's Vitals   02/29/24 1152 02/29/24 1240  BP: (!) 140/82 (!) 160/80  Pulse: (!) 57   SpO2: 90%   Weight: 217 lb 9.6 oz (98.7 kg)   Height: 5' 10 (1.778 m)    Body mass index is 31.22 kg/m.  Gen: resting comfortably, no acute distress HEENT: no scleral icterus, pupils equal round and reactive, no palptable cervical adenopathy,  CV: RRR, no m/rg, no jvd Resp: Clear to auscultation bilaterally GI: abdomen is soft, non-tender, non-distended, normal bowel sounds, no hepatosplenomegaly MSK: extremities are warm, no edema.  Skin: warm, no rash Neuro:  no focal deficits Psych: appropriate affect   Diagnostic Studies  10/2019 echo 1. Normal LV systolic function; mild LVE; mild LVH; grade 1 diastolic  dysfunction; mildly dilated ascending aorta.   2. Left ventricular ejection fraction, by estimation, is 60 to 65%. The  left ventricle has normal  function. The left ventricle has no regional  wall motion abnormalities. The left ventricular internal cavity size was  mildly dilated. There is mild left  ventricular hypertrophy. Left ventricular diastolic parameters are  consistent with Grade I diastolic dysfunction (impaired relaxation).  Elevated left atrial pressure.   3. Right ventricular systolic function is normal. The right ventricular  size is normal. There is mildly elevated pulmonary artery systolic  pressure.   4. The mitral valve is normal in structure. Trivial mitral valve  regurgitation. No evidence of mitral stenosis.   5. The aortic valve is tricuspid. Aortic valve regurgitation is not  visualized. Mild aortic valve sclerosis is present, with no evidence of  aortic valve stenosis.   6. Aortic dilatation noted. There is mild dilatation of the ascending  aorta measuring 41 mm.   7. The inferior vena cava is normal in size with greater than 50%  respiratory variability, suggesting right atrial pressure of 3 mmHg.    10/2019 nuclear stress Nuclear stress EF: 60%. The left ventricular ejection fraction is normal (55-65%). This is a low risk study. There is no evidence of ischemia and no evidence of previous myocardial infarction. The study is normal.  07/2023 cath  Prox RCA to Mid RCA lesion is 99% stenosed.   A stent was successfully placed.   Post intervention, there is a 0% residual stenosis.   1.  99% mid right coronary artery lesion requiring complex PCI with multiple wires and GuideLiner with 3 overlapping drug-eluting stents.  The case was complicated by transient need for norepinephrine  and vasopressin .  The patient received bicarbonate and Lasix  10 mg IV during the procedure.  All pressors were weaned at the conclusion of the procedure. 2.  Mild left-sided disease. 3.  Significant upper extremity tortuosity requiring destination sheath.  4.  LVEDP of 25 mmHg at the conclusion of the procedure and after Lasix  had  and administered. 5.  Due to inability to take p.o. during the procedure, the patient was treated with a cangrelor  drip.  Transition to Brilinta  will be managed by pharmacy.   Recommendation: Dual antiplatelet therapy for 1 year and aggressive treatment of cardiovascular risk factors.  07/2023 echo 1. Left ventricular ejection fraction, by estimation, is 60 to 65%. The  left ventricle has normal function. The left ventricle has no regional  wall motion abnormalities. There is mild concentric left ventricular  hypertrophy. Left ventricular diastolic  parameters are consistent with Grade II diastolic dysfunction  (pseudonormalization). Elevated left ventricular end-diastolic pressure.   2. Right ventricular systolic function is normal. The right ventricular  size is normal. There is normal pulmonary artery systolic pressure. The  estimated right ventricular systolic pressure is 30.8 mmHg.   3. The mitral valve is normal in structure. Mild mitral valve  regurgitation.   4. The aortic valve is tricuspid. Aortic valve regurgitation is not  visualized. Aortic valve sclerosis/calcification is present, without any  evidence of aortic stenosis.   5. The inferior vena cava is dilated in size with <50% respiratory  variability, suggesting right atrial pressure of 15 mmHg.     Assessment and Plan   Chronic HFpEF -he is euvolemic today, continue current meds   2. HTN - bp is above goal, increase losartan  to 100mg  daily.    3. HLD - at goal, continue current meds  4. CAD - no recent symptoms - possibly stop plavix  at next f/u, will be a year out from his MI  5.Afib/acquired thrombophilia - no symptoms, continue current meds including eliquis  for stroke prevention.    6. AAA s/p EVAR - overdue for f/u with vascular, will refer  Dorn PHEBE Ross, M.D.

## 2024-02-29 NOTE — Patient Instructions (Addendum)
 Medication Instructions:   Increase Losartan  to 100mg  daily  Continue all other medications.     Labwork:  none  Testing/Procedures:  none  Follow-Up:  6 months   Any Other Special Instructions Will Be Listed Below (If Applicable).  You have been referred to:  Vascular   If you need a refill on your cardiac medications before your next appointment, please call your pharmacy.

## 2024-03-01 ENCOUNTER — Ambulatory Visit (HOSPITAL_COMMUNITY)
Admission: RE | Admit: 2024-03-01 | Discharge: 2024-03-01 | Disposition: A | Discharge status: Home / Self Care | Source: Ambulatory Visit | Attending: Neurology | Admitting: Neurology

## 2024-03-01 ENCOUNTER — Telehealth: Payer: Self-pay | Admitting: Cardiology

## 2024-03-01 DIAGNOSIS — R4189 Other symptoms and signs involving cognitive functions and awareness: Secondary | ICD-10-CM | POA: Insufficient documentation

## 2024-03-01 DIAGNOSIS — G319 Degenerative disease of nervous system, unspecified: Secondary | ICD-10-CM

## 2024-03-01 NOTE — Telephone Encounter (Signed)
 Wife state she was returning call. Please advise

## 2024-03-07 ENCOUNTER — Other Ambulatory Visit: Payer: Self-pay | Admitting: Nurse Practitioner

## 2024-03-07 ENCOUNTER — Ambulatory Visit: Payer: Self-pay | Admitting: Neurology

## 2024-03-07 NOTE — Progress Notes (Signed)
 Please call and advise the patient that the recent brain MRI did not show any acute findings, such as a stroke, or mass or blood products. 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 Please remind patient to keep any upcoming appointments or tests and to call us  with any interim questions, concerns, problems or updates. Thanks,   Pastor Falling, MD

## 2024-03-07 NOTE — Progress Notes (Signed)
 Take him to his PCP or urgent care to make sure he is not having an infection or something else.

## 2024-03-08 ENCOUNTER — Telehealth: Payer: Self-pay

## 2024-03-08 NOTE — Telephone Encounter (Signed)
 Wife returned call, she verbalized understanding of results. She will reach out to PCP or urgent care to rule out underlying infection

## 2024-03-11 ENCOUNTER — Other Ambulatory Visit: Payer: Self-pay

## 2024-03-11 DIAGNOSIS — I7143 Infrarenal abdominal aortic aneurysm, without rupture: Secondary | ICD-10-CM

## 2024-03-14 DIAGNOSIS — L0390 Cellulitis, unspecified: Secondary | ICD-10-CM | POA: Diagnosis not present

## 2024-03-14 DIAGNOSIS — S92425A Nondisplaced fracture of distal phalanx of left great toe, initial encounter for closed fracture: Secondary | ICD-10-CM | POA: Diagnosis not present

## 2024-03-18 ENCOUNTER — Ambulatory Visit

## 2024-03-21 ENCOUNTER — Telehealth: Payer: Self-pay | Admitting: Neurology

## 2024-03-21 NOTE — Telephone Encounter (Signed)
 Pt wife called to request to speak to Nurse about  Pt future treatment . Pt wife is concern and wanted to know what s next steps for PT .

## 2024-03-21 NOTE — Telephone Encounter (Signed)
 Wife called and reported that she is not happy because she feels like Dr Gregg is not helping her husband. She reports that he fell on 9/20 and broke his toe. She did take him to urgent care and and was started on an antibiotic due to  broken toe from the fall. No UTI or underlying infection which was not reported back to our office. She expressed lack of trust in care and I asked if she would prefer to have her PCP send referral to another neurology office and she wanted to do that. Advised I would inform Dr. Gregg.

## 2024-03-25 ENCOUNTER — Ambulatory Visit (INDEPENDENT_AMBULATORY_CARE_PROVIDER_SITE_OTHER)

## 2024-03-25 DIAGNOSIS — J4550 Severe persistent asthma, uncomplicated: Secondary | ICD-10-CM

## 2024-03-31 ENCOUNTER — Ambulatory Visit: Admitting: Family Medicine

## 2024-04-13 ENCOUNTER — Ambulatory Visit (INDEPENDENT_AMBULATORY_CARE_PROVIDER_SITE_OTHER): Admitting: Family Medicine

## 2024-04-13 ENCOUNTER — Encounter: Payer: Self-pay | Admitting: Family Medicine

## 2024-04-13 VITALS — BP 130/69 | HR 63 | Temp 97.7°F | Ht 70.0 in | Wt 216.0 lb

## 2024-04-13 DIAGNOSIS — I10 Essential (primary) hypertension: Secondary | ICD-10-CM | POA: Diagnosis not present

## 2024-04-13 DIAGNOSIS — E1165 Type 2 diabetes mellitus with hyperglycemia: Secondary | ICD-10-CM

## 2024-04-13 DIAGNOSIS — Z7984 Long term (current) use of oral hypoglycemic drugs: Secondary | ICD-10-CM

## 2024-04-13 DIAGNOSIS — I5032 Chronic diastolic (congestive) heart failure: Secondary | ICD-10-CM

## 2024-04-13 DIAGNOSIS — Z720 Tobacco use: Secondary | ICD-10-CM | POA: Diagnosis not present

## 2024-04-13 DIAGNOSIS — E782 Mixed hyperlipidemia: Secondary | ICD-10-CM

## 2024-04-13 DIAGNOSIS — I2510 Atherosclerotic heart disease of native coronary artery without angina pectoris: Secondary | ICD-10-CM | POA: Diagnosis not present

## 2024-04-13 DIAGNOSIS — I480 Paroxysmal atrial fibrillation: Secondary | ICD-10-CM

## 2024-04-13 DIAGNOSIS — J449 Chronic obstructive pulmonary disease, unspecified: Secondary | ICD-10-CM

## 2024-04-13 DIAGNOSIS — F32A Depression, unspecified: Secondary | ICD-10-CM

## 2024-04-13 DIAGNOSIS — D72829 Elevated white blood cell count, unspecified: Secondary | ICD-10-CM

## 2024-04-13 DIAGNOSIS — M109 Gout, unspecified: Secondary | ICD-10-CM | POA: Insufficient documentation

## 2024-04-13 DIAGNOSIS — F09 Unspecified mental disorder due to known physiological condition: Secondary | ICD-10-CM

## 2024-04-13 DIAGNOSIS — M1A9XX0 Chronic gout, unspecified, without tophus (tophi): Secondary | ICD-10-CM

## 2024-04-13 NOTE — Patient Instructions (Signed)
Labs today.  Follow up in 3 months.  Take care  Dr. Breckyn Ticas  

## 2024-04-14 ENCOUNTER — Ambulatory Visit: Payer: Self-pay | Admitting: Family Medicine

## 2024-04-14 DIAGNOSIS — F09 Unspecified mental disorder due to known physiological condition: Secondary | ICD-10-CM | POA: Insufficient documentation

## 2024-04-14 HISTORY — DX: Unspecified mental disorder due to known physiological condition: F09

## 2024-04-14 LAB — CBC
Hematocrit: 44.9 % (ref 37.5–51.0)
Hemoglobin: 15 g/dL (ref 13.0–17.7)
MCH: 32.4 pg (ref 26.6–33.0)
MCHC: 33.4 g/dL (ref 31.5–35.7)
MCV: 97 fL (ref 79–97)
Platelets: 177 x10E3/uL (ref 150–450)
RBC: 4.63 x10E6/uL (ref 4.14–5.80)
RDW: 14 % (ref 11.6–15.4)
WBC: 12.5 x10E3/uL — ABNORMAL HIGH (ref 3.4–10.8)

## 2024-04-14 LAB — LIPID PANEL
Chol/HDL Ratio: 2.7 ratio (ref 0.0–5.0)
Cholesterol, Total: 103 mg/dL (ref 100–199)
HDL: 38 mg/dL — ABNORMAL LOW (ref >39)
LDL Chol Calc (NIH): 40 mg/dL (ref 0–99)
Triglycerides: 144 mg/dL (ref 0–149)
VLDL Cholesterol Cal: 25 mg/dL (ref 5–40)

## 2024-04-14 LAB — CMP14+EGFR
ALT: 21 IU/L (ref 0–44)
AST: 16 IU/L (ref 0–40)
Albumin: 4.3 g/dL (ref 3.7–4.7)
Alkaline Phosphatase: 117 IU/L (ref 48–129)
BUN/Creatinine Ratio: 17 (ref 10–24)
BUN: 17 mg/dL (ref 8–27)
Bilirubin Total: 0.3 mg/dL (ref 0.0–1.2)
CO2: 27 mmol/L (ref 20–29)
Calcium: 10.1 mg/dL (ref 8.6–10.2)
Chloride: 97 mmol/L (ref 96–106)
Creatinine, Ser: 1.01 mg/dL (ref 0.76–1.27)
Globulin, Total: 2.5 g/dL (ref 1.5–4.5)
Glucose: 134 mg/dL — ABNORMAL HIGH (ref 70–99)
Potassium: 4 mmol/L (ref 3.5–5.2)
Sodium: 139 mmol/L (ref 134–144)
Total Protein: 6.8 g/dL (ref 6.0–8.5)
eGFR: 74 mL/min/1.73 (ref >59)

## 2024-04-14 LAB — HEMOGLOBIN A1C
Est. average glucose Bld gHb Est-mCnc: 197 mg/dL
Hgb A1c MFr Bld: 8.5 % — ABNORMAL HIGH (ref 4.8–5.6)

## 2024-04-14 MED ORDER — SERTRALINE HCL 50 MG PO TABS: 50.0000 mg | ORAL_TABLET | Freq: Every day | ORAL | 3 refills | Status: AC

## 2024-04-14 MED ORDER — TORSEMIDE 20 MG PO TABS
20.0000 mg | ORAL_TABLET | Freq: Every day | ORAL | 3 refills | Status: AC
Start: 2024-04-14 — End: ?

## 2024-04-14 MED ORDER — METFORMIN HCL 500 MG PO TABS: 1000.0000 mg | ORAL_TABLET | Freq: Two times a day (BID) | ORAL | 3 refills | Status: AC

## 2024-04-14 MED ORDER — APIXABAN 5 MG PO TABS
5.0000 mg | ORAL_TABLET | Freq: Two times a day (BID) | ORAL | 3 refills | Status: AC
Start: 2024-04-14 — End: ?

## 2024-04-14 MED ORDER — TAMSULOSIN HCL 0.4 MG PO CAPS: 0.4000 mg | ORAL_CAPSULE | Freq: Every day | ORAL | 3 refills | Status: AC

## 2024-04-14 MED ORDER — ALLOPURINOL 300 MG PO TABS: 300.0000 mg | ORAL_TABLET | Freq: Every day | ORAL | 3 refills | Status: AC

## 2024-04-14 MED ORDER — CLOPIDOGREL BISULFATE 75 MG PO TABS: 75.0000 mg | ORAL_TABLET | Freq: Every day | ORAL | 3 refills | Status: AC

## 2024-04-14 MED ORDER — ATORVASTATIN CALCIUM 80 MG PO TABS: 80.0000 mg | ORAL_TABLET | Freq: Every day | ORAL | 3 refills | Status: AC

## 2024-04-14 MED ORDER — TRELEGY ELLIPTA 100-62.5-25 MCG/ACT IN AEPB: 1.0000 | INHALATION_SPRAY | Freq: Every day | RESPIRATORY_TRACT | 6 refills | Status: DC

## 2024-04-14 MED ORDER — LOSARTAN POTASSIUM 100 MG PO TABS
100.0000 mg | ORAL_TABLET | Freq: Every day | ORAL | 3 refills | Status: AC
Start: 2024-04-14 — End: ?

## 2024-04-14 MED ORDER — FAMOTIDINE 20 MG PO TABS: 20.0000 mg | ORAL_TABLET | Freq: Two times a day (BID) | ORAL | 3 refills | Status: AC

## 2024-04-14 MED ORDER — AMLODIPINE BESYLATE 10 MG PO TABS: 10.0000 mg | ORAL_TABLET | Freq: Every day | ORAL | 3 refills | Status: AC

## 2024-04-14 MED ORDER — MEMANTINE HCL 5 MG PO TABS
5.0000 mg | ORAL_TABLET | Freq: Two times a day (BID) | ORAL | 3 refills | Status: AC
Start: 2024-04-14 — End: ?

## 2024-04-14 MED ORDER — METOPROLOL SUCCINATE ER 50 MG PO TB24
50.0000 mg | ORAL_TABLET | Freq: Two times a day (BID) | ORAL | 3 refills | Status: AC
Start: 2024-04-14 — End: ?

## 2024-04-14 NOTE — Assessment & Plan Note (Addendum)
 Stable.  Continue amlodipine , losartan , metoprolol , torsemide .  Medications refilled today.

## 2024-04-14 NOTE — Progress Notes (Signed)
 Subjective:  Patient ID: Kevin Frank, male    DOB: Apr 18, 1941  Age: 83 y.o. MRN: [id number]  CC:   Chief Complaint  Patient presents with   New Patient (Initial Visit)    Heart attack 2/ 2025 stents placed x 3     HPI:  83 year old male with an extensive past medical history presents to establish care.  Patient's wife is concerned as she states that he has not been his normal self since he was hospitalized in February.  Patient denies any concerns at this time.  He continues to smoke.  He is not interested in quitting.  Type 2 diabetes is uncontrolled.  Needs updated A1c.  Currently he is only on metformin  for type 2 diabetes.  Per EMR he has been on SGLT2 in the past and this was discontinued due to urinary tract infection.  Given his cardiac history he would benefit from SGLT2 or GLP-1.  Hypertension stable.  LDL has been at goal on atorvastatin .  Needs lipid panel today.  Patient Active Problem List   Diagnosis Date Noted   Cognitive dysfunction 04/14/2024   CAD (coronary artery disease) 04/13/2024   Depression 04/13/2024   Chronic heart failure with preserved ejection fraction (HFpEF) (HCC) 08/11/2023   S/P right coronary artery (RCA) stent placement 08/10/2023   Paroxysmal atrial fibrillation (HCC) 08/10/2023   Chronic gouty arthritis 07/14/2022   Polyarthralgia 07/14/2022   Uncontrolled type 2 diabetes mellitus with hyperglycemia (HCC) 06/27/2019   Mixed hyperlipidemia 06/27/2019   Class 1 obesity due to excess calories with serious comorbidity and body mass index (BMI) of 32.0 to 32.9 in adult 06/27/2019   Squamous cell carcinoma of right lung (HCC) 03/29/2018   Seasonal and perennial allergic rhinitis 09/22/2017   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   Obstructive sleep apnea on CPAP 04/23/2017   Chronic knee pain 04/23/2017   Current smoker 03/05/2016   Stage 2 moderate COPD by GOLD classification (HCC) 03/05/2016   AAA (abdominal aortic aneurysm)  without rupture 06/14/2015   Tobacco use 06/09/2007   Essential hypertension 05/06/2007    Social Hx   Social History   Socioeconomic History   Marital status: Married    Spouse name: Niels   Number of children: 2   Years of education: Not on file   Highest education level: Not on file  Occupational History   Not on file  Tobacco Use   Smoking status: Every Day    Current packs/day: 2.00    Average packs/day: 2.0 packs/day for 50.0 years (100.0 ttl pk-yrs)    Types: Cigarettes    Passive exposure: Never   Smokeless tobacco: Never   Tobacco comments:    Smokes 2 packs a day [facility] 10/17/2021   Vaping Use   Vaping status: Never Used  Substance and Sexual Activity   Alcohol use: Yes    Alcohol/week: 0.0 standard drinks of alcohol    Comment: very little   Drug use: No   Sexual activity: Not on file  Other Topics Concern   Not on file  Social History Narrative   Lives at home with wife   Left handed   Previous farmer   Social Drivers of Health   Financial Resource Strain: Low Risk  (03/05/2022)   Overall Financial Resource Strain (CARDIA)    Difficulty of Paying Living Expenses: Not hard at all  Food Insecurity: No Food Insecurity (01/01/2024)   Hunger Vital Sign    Worried About Running Out of Food in the  Last Year: Never true    Ran Out of Food in the Last Year: Never true  Transportation Needs: No Transportation Needs (01/01/2024)   PRAPARE - Administrator, Civil Service (Medical): No    Lack of Transportation (Non-Medical): No  Physical Activity: Not on file  Stress: Not on file  Social Connections: Socially Integrated (12/30/2023)   Social Connection and Isolation Panel    Frequency of Communication with Friends and Family: More than three times a week    Frequency of Social Gatherings with Friends and Family: More than three times a week    Attends Religious Services: More than 4 times per year    Active Member of [employer] or Organizations: No    Attends  Engineer, structural: 1 to 4 times per year    Marital Status: Married    Review of Systems Per HPI  Objective:  BP 130/69   Pulse 63   Temp 97.7 F (36.5 C)   Ht 5' 10 (1.778 m)   Wt 216 lb (98 kg)   SpO2 93%   BMI 30.99 kg/m      04/13/2024    1:57 PM 02/29/2024   12:40 PM 02/29/2024   11:52 AM  BP/Weight  Systolic BP 130 160 140  Diastolic BP 69 80 82  Wt. (Lbs) 216  217.6  BMI 30.99 kg/m2  31.22 kg/m2    Physical Exam Vitals and nursing note reviewed.  Constitutional:      General: He is not in acute distress.    Appearance: Normal appearance. He is obese.  HENT:     Head: Normocephalic and atraumatic.  Eyes:     General:        Right eye: No discharge.        Left eye: No discharge.     Conjunctiva/sclera: Conjunctivae normal.  Cardiovascular:     Rate and Rhythm: Normal rate and regular rhythm.  Pulmonary:     Effort: Pulmonary effort is normal.     Comments: Coarse breath sounds. Neurological:     Mental Status: He is alert.  Psychiatric:        Mood and Affect: Mood normal.        Behavior: Behavior normal.     Lab Results  Component Value Date   WBC 12.5 (H) 04/13/2024   HGB 15.0 04/13/2024   HCT 44.9 04/13/2024   PLT 177 04/13/2024   GLUCOSE 134 (H) 04/13/2024   CHOL 103 04/13/2024   TRIG 144 04/13/2024   HDL 38 (L) 04/13/2024   LDLCALC 40 04/13/2024   ALT 21 04/13/2024   AST 16 04/13/2024   NA 139 04/13/2024   K 4.0 04/13/2024   CL 97 04/13/2024   CREATININE 1.01 04/13/2024   BUN 17 04/13/2024   CO2 27 04/13/2024   TSH 0.378 08/07/2023   PSA 2.0 10/09/2016   INR 1.3 (H) 08/04/2023   HGBA1C 8.5 (H) 04/13/2024     Assessment & Plan:  Essential hypertension Assessment & Plan: Stable.  Continue amlodipine , losartan , metoprolol , torsemide .  Medications refilled today.  Orders: -     CMP14+EGFR -     amLODIPine  Besylate; Take 1 tablet (10 mg total) by mouth daily.  Dispense: 90 tablet; Refill: 3 -     Losartan   Potassium; Take 1 tablet (100 mg total) by mouth daily.  Dispense: 90 tablet; Refill: 3  Tobacco use  Coronary artery disease involving native coronary artery of native heart without angina pectoris  Depression, unspecified depression type Assessment & Plan: Stable.  Zoloft  refilled.  Orders: -     Sertraline  HCl; Take 1 tablet (50 mg total) by mouth daily.  Dispense: 90 tablet; Refill: 3  Chronic gout without tophus, unspecified cause, unspecified site  Leukocytosis, unspecified type -     CBC  Mixed hyperlipidemia -     Lipid panel -     Atorvastatin  Calcium ; Take 1 tablet (80 mg total) by mouth daily.  Dispense: 90 tablet; Refill: 3  Uncontrolled type 2 diabetes mellitus with hyperglycemia (HCC) Assessment & Plan: A1c today to assess.  Continue metformin .  Would benefit from GLP-1.  Per the EMR, SGLT2 has been tried in the past and caused urinary tract infection.  Orders: -     Hemoglobin A1c -     metFORMIN  HCl; Take 2 tablets (1,000 mg total) by mouth 2 (two) times daily.  Dispense: 360 tablet; Refill: 3  Gout, unspecified cause, unspecified chronicity, unspecified site -     Allopurinol ; Take 1 tablet (300 mg total) by mouth daily.  Dispense: 90 tablet; Refill: 3  Paroxysmal atrial fibrillation (HCC) Assessment & Plan: Stable.  Continue current medications.  Medications refilled today.  Orders: -     Apixaban ; Take 1 tablet (5 mg total) by mouth 2 (two) times daily.  Dispense: 180 tablet; Refill: 3 -     Metoprolol  Succinate ER; Take 1 tablet (50 mg total) by mouth 2 (two) times daily. Take with or immediately following a meal.  Dispense: 180 tablet; Refill: 3  Stage 2 moderate COPD by GOLD classification (HCC) Assessment & Plan: Needs to quit smoking.  He is not interested.  Orders: -     Trelegy Ellipta ; Inhale 1 puff into the lungs daily.  Dispense: 60 each; Refill: 6  Cognitive dysfunction Assessment & Plan: He is on Namenda .  Refilled.  Orders: -      Memantine  HCl; Take 1 tablet (5 mg total) by mouth 2 (two) times daily.  Dispense: 180 tablet; Refill: 3  Chronic heart failure with preserved ejection fraction (HFpEF) (HCC) -     Torsemide ; Take 1 tablet (20 mg total) by mouth daily.  Dispense: 90 tablet; Refill: 3  Other orders -     Clopidogrel  Bisulfate; Take 1 tablet (75 mg total) by mouth daily.  Dispense: 90 tablet; Refill: 3 -     Famotidine ; Take 1 tablet (20 mg total) by mouth 2 (two) times daily.  Dispense: 180 tablet; Refill: 3 -     Tamsulosin  HCl; Take 1 capsule (0.4 mg total) by mouth daily after supper.  Dispense: 90 capsule; Refill: 3    Follow-up:  3 months  Arriyana Rodell Bluford DO [facility]

## 2024-04-14 NOTE — Assessment & Plan Note (Signed)
 He is on Namenda .  Refilled.

## 2024-04-14 NOTE — Assessment & Plan Note (Signed)
 Needs to quit smoking.  He is not interested.

## 2024-04-14 NOTE — Assessment & Plan Note (Signed)
 Stable.  Zoloft  refilled.

## 2024-04-14 NOTE — Assessment & Plan Note (Signed)
Stable.  Continue current medications.  Medications refilled today.

## 2024-04-14 NOTE — Assessment & Plan Note (Signed)
 A1c today to assess.  Continue metformin .  Would benefit from GLP-1.  Per the EMR, SGLT2 has been tried in the past and caused urinary tract infection.

## 2024-04-18 ENCOUNTER — Ambulatory Visit (HOSPITAL_COMMUNITY)
Admission: RE | Admit: 2024-04-18 | Discharge: 2024-04-18 | Disposition: A | Discharge status: Home / Self Care | Source: Ambulatory Visit | Attending: Surgery | Admitting: Surgery

## 2024-04-18 ENCOUNTER — Ambulatory Visit: Admitting: Surgery

## 2024-04-18 ENCOUNTER — Encounter: Admitting: Pulmonary Disease

## 2024-04-18 ENCOUNTER — Encounter: Payer: Self-pay | Admitting: Surgery

## 2024-04-18 VITALS — BP 121/71 | HR 63 | Temp 97.7°F | Ht 70.0 in | Wt 216.0 lb

## 2024-04-18 DIAGNOSIS — I7143 Infrarenal abdominal aortic aneurysm, without rupture: Secondary | ICD-10-CM | POA: Diagnosis present

## 2024-04-18 DIAGNOSIS — I714 Abdominal aortic aneurysm, without rupture, unspecified: Secondary | ICD-10-CM

## 2024-04-18 NOTE — Progress Notes (Signed)
 [facility]   Patient name: Kevin Frank MRN: [id number] DOB: July 01, 1940 Sex: male   REASON FOR VISIT:    Follow up  [name]:   [name] is a 83 y.o. 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  The day after his procedure, his creatinine bumped and ultrasound did not show good perfusion to the unstented left kidney.  Therefore he went to the Cath Lab and have the left renal artery stented.  His creatinine trended down following that.  He had a CT scan performed on 08/18/2021 for blunt trauma which did not show any complications from his care.  His carotid duplex showed less than 40% stenosis (preop)     He is back today for follow up.  He has no complaints.   He is medically managed for type 2 diabetes as well as hypertension.  He takes a statin for hypercholesterolemia.  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    Normal nuclear stress test 2011   Obesity    Obstructive sleep apnea on CPAP    Presence of indwelling urinary catheter    Shortness of breath    due to cigarette abuse   Tobacco abuse      FAMILY HISTORY:   Family History  Problem Relation Age of Onset   Liver disease Father    Lymphoma Mother    Diabetes Mother    Hypertension Mother    Cancer Mother    Mental retardation Other        sibling -- from birth    Asthma  Daughter    Allergic rhinitis Neg Hx    Angioedema Neg Hx    Atopy Neg Hx    Eczema Neg Hx    Immunodeficiency Neg Hx    Colon cancer Neg Hx     SOCIAL HISTORY:   Social History   Tobacco Use   Smoking status: Every Day    Current packs/day: 2.00    Average packs/day: 2.0 packs/day for 50.0 years (100.0 ttl pk-yrs)    Types: Cigarettes    Passive exposure: Never   Smokeless tobacco: Never   Tobacco comments:    Smokes 2 packs a day [facility] 10/17/2021   Substance Use Topics   Alcohol use: Yes    Alcohol/week: 0.0 standard drinks of alcohol    Comment: very little     ALLERGIES:   Allergies  Allergen Reactions   Amiodarone  Hives and Rash     CURRENT MEDICATIONS:   Current Outpatient Medications  Medication Sig Dispense Refill   albuterol  (PROVENTIL ) (2.5 MG/3ML) 0.083% nebulizer solution Take 3 mLs (2.5 mg total) by nebulization every 4 (four) hours as needed for wheezing or shortness of breath. 150 mL 2   albuterol  (VENTOLIN   HFA) 108 (90 Base) MCG/ACT inhaler Inhale 2 puffs into the lungs every 4 (four) hours as needed for wheezing or shortness of breath (cough). 18 g 2   allopurinol  (ZYLOPRIM ) 300 MG tablet Take 1 tablet (300 mg total) by mouth daily. 90 tablet 3   amLODipine  (NORVASC ) 10 MG tablet Take 1 tablet (10 mg total) by mouth daily. 90 tablet 3   apixaban  (ELIQUIS ) 5 MG TABS tablet Take 1 tablet (5 mg total) by mouth 2 (two) times daily. 180 tablet 3   atorvastatin  (LIPITOR) 80 MG tablet Take 1 tablet (80 mg total) by mouth daily. 90 tablet 3   clopidogrel  (PLAVIX ) 75 MG tablet Take 1 tablet (75 mg total) by mouth daily. 90 tablet 3   Cobalamin Combinations (NEURIVA PLUS) CAPS Take 1 tablet by mouth daily.     EPINEPHrine  0.3 mg/0.3 mL IJ SOAJ injection Inject 0.3 mg into the muscle as needed for anaphylaxis. 1 each 1   famotidine  (PEPCID ) 20 MG tablet Take 1 tablet (20 mg total) by mouth 2 (two) times daily. 180 tablet 3   Fluticasone -Umeclidin-Vilant (TRELEGY  ELLIPTA) 100-62.5-25 MCG/ACT AEPB Inhale 1 puff into the lungs daily. 60 each 6   losartan  (COZAAR ) 100 MG tablet Take 1 tablet (100 mg total) by mouth daily. 90 tablet 3   memantine  (NAMENDA ) 5 MG tablet Take 1 tablet (5 mg total) by mouth 2 (two) times daily. 180 tablet 3   metFORMIN  (GLUCOPHAGE ) 500 MG tablet Take 2 tablets (1,000 mg total) by mouth 2 (two) times daily. 360 tablet 3   metoprolol  succinate (TOPROL -XL) 50 MG 24 hr tablet Take 1 tablet (50 mg total) by mouth 2 (two) times daily. Take with or immediately following a meal. 180 tablet 3   nitroGLYCERIN  (NITROSTAT ) 0.4 MG SL tablet Place 1 tablet (0.4 mg total) under the tongue every 5 (five) minutes as needed for chest pain. 100 tablet 3   sertraline  (ZOLOFT ) 50 MG tablet Take 1 tablet (50 mg total) by mouth daily. 90 tablet 3   tamsulosin  (FLOMAX ) 0.4 MG CAPS capsule Take 1 capsule (0.4 mg total) by mouth daily after supper. 90 capsule 3   Tezepelumab -ekko (TEZSPIRE ) 210 MG/1. SOAJ Inject 210 mg into the skin every 28 (twenty-eight) days. 1.91 mL 11   torsemide  (DEMADEX ) 20 MG tablet Take 1 tablet (20 mg total) by mouth daily. 90 tablet 3   No current facility-administered medications for this visit.    REVIEW OF SYSTEMS:   [X]  denotes positive finding, [ ]  denotes negative finding Cardiac  Comments:  Chest pain or chest pressure:    Shortness of breath upon exertion:    Short of breath when lying flat:    Irregular heart rhythm:        Vascular    Pain in calf, thigh, or hip brought on by ambulation:    Pain in feet at night that wakes you up from your sleep:     Blood clot in your veins:    Leg swelling:         Pulmonary    Oxygen  at home:    Productive cough:     Wheezing:         Neurologic    Sudden weakness in arms or legs:     Sudden numbness in arms or legs:     Sudden onset of difficulty speaking or slurred speech:    Temporary loss of vision in one eye:     Problems with dizziness:  Gastrointestinal    Blood in stool:     Vomited blood:         Genitourinary    Burning when urinating:     Blood in urine:        Psychiatric    Major depression:         Hematologic    Bleeding problems:    Problems with blood clotting too easily:        Skin    Rashes or ulcers:        Constitutional    Fever or chills:      PHYSICAL EXAM:   Vitals:   04/18/24 1015  BP: 121/71  Pulse: 63  Temp: 97.7 F (36.5 C)  SpO2: 91%  Weight: 216 lb (98 kg)  Height: 5' 10 (1.778 m)    GENERAL: The patient is a well-nourished male, in no acute distress. The vital signs are documented above. CARDIAC: There is a regular rate and rhythm.  PULMONARY: Non-labored respirations ABDOMEN: Soft and non-tender   MUSCULOSKELETAL: There are no major deformities or cyanosis. NEUROLOGIC: No focal weakness or paresthesias are detected. SKIN: There are no ulcers or rashes noted. PSYCHIATRIC: The patient has a normal affect.  STUDIES:   I have reviewed the following ultrasound: Abdominal Aorta: Very suboptimal limited exam with second tech. Limbs not  visualized. Further imaging modality may be warranted. The largest aortic  diameter has decreased compared to prior exam. Previous diameter  measurement was 4.0 cm obtained on  12/15/2022.  MEDICAL ISSUES:   Status post fenestrated aneurysm repair: Ultrasound had suboptimal imaging today.  The diameter of the aneurysm appears to be decreased.  Renal stents were not visualized.  I discussed this with the patient and his son.  I have no concerns about his repair at this time however I would like better visualization.  I think this needs to be done with a CT scan.  I will schedule this with his return to see me in 6 months    Malvina New, IV, MD, FACS [facility] [phone] Pager [phone]

## 2024-04-20 NOTE — Telephone Encounter (Signed)
 Attempted to reach patient.  No answer or voicemail at either number.  OK for E2C2 to give information.

## 2024-04-20 NOTE — Telephone Encounter (Signed)
-----   Message from Jacqulyn KANDICE Ahle sent at 04/14/2024 10:13 PM EDT ----- A1c uncontrolled. Recommend addition of GLP1 (weekly injection). ----- Message ----- From: Rebecka Memos Lab Results In Sent: 04/14/2024   5:36 AM EDT To: Jayce G Cook, DO

## 2024-04-22 ENCOUNTER — Telehealth: Payer: Self-pay

## 2024-04-22 NOTE — Telephone Encounter (Signed)
 Copied from CRM [id number]. Topic: Clinical - Lab/Test Results >> Apr 22, 2024  1:43 PM Ahlexyia S wrote: Reason for CRM: Pt wife Kevin Frank returning call about pt lab results. Per pt chart results are okay to relay. Provided Kevin Frank with info. Kevin Frank had questions about the next steps for the addition of the GLP1 weekly injection that Dr. Bluford is recommending. Kevin Frank is requesting a callback for this.

## 2024-04-25 ENCOUNTER — Ambulatory Visit (INDEPENDENT_AMBULATORY_CARE_PROVIDER_SITE_OTHER)

## 2024-04-25 DIAGNOSIS — J4550 Severe persistent asthma, uncomplicated: Secondary | ICD-10-CM | POA: Diagnosis not present

## 2024-04-25 MED ORDER — TEZEPELUMAB-EKKO 210 MG/1.91ML ~~LOC~~ SOSY
210.0000 mg | PREFILLED_SYRINGE | SUBCUTANEOUS | Status: AC
  Administered 2024-04-25 – 2024-06-24 (×3): 210 mg via SUBCUTANEOUS

## 2024-04-26 ENCOUNTER — Telehealth: Payer: Self-pay

## 2024-04-26 NOTE — Telephone Encounter (Signed)
 Called patient returning the call to Kevin Frank with questions about glp1 , mailbox is full

## 2024-05-10 NOTE — Progress Notes (Signed)
 Letter sent

## 2024-05-11 ENCOUNTER — Ambulatory Visit (INDEPENDENT_AMBULATORY_CARE_PROVIDER_SITE_OTHER): Admitting: Family Medicine

## 2024-05-11 DIAGNOSIS — Z23 Encounter for immunization: Secondary | ICD-10-CM | POA: Diagnosis not present

## 2024-05-11 NOTE — Progress Notes (Signed)
 Flu shot given by clinical staff.  Jacqulyn Ahle DO [facility]

## 2024-05-23 ENCOUNTER — Ambulatory Visit

## 2024-05-27 ENCOUNTER — Encounter: Payer: Self-pay | Admitting: Family Medicine

## 2024-05-27 ENCOUNTER — Telehealth: Payer: Self-pay | Admitting: Family Medicine

## 2024-05-27 ENCOUNTER — Other Ambulatory Visit: Payer: Self-pay

## 2024-05-27 ENCOUNTER — Ambulatory Visit

## 2024-05-27 ENCOUNTER — Other Ambulatory Visit: Payer: Self-pay | Admitting: Family Medicine

## 2024-05-27 ENCOUNTER — Ambulatory Visit: Admitting: Family Medicine

## 2024-05-27 VITALS — BP 148/72 | HR 80 | Temp 98.2°F | Resp 18 | Ht 69.0 in | Wt 222.4 lb

## 2024-05-27 DIAGNOSIS — J302 Other seasonal allergic rhinitis: Secondary | ICD-10-CM

## 2024-05-27 DIAGNOSIS — F17200 Nicotine dependence, unspecified, uncomplicated: Secondary | ICD-10-CM

## 2024-05-27 DIAGNOSIS — J3089 Other allergic rhinitis: Secondary | ICD-10-CM | POA: Diagnosis not present

## 2024-05-27 DIAGNOSIS — J4550 Severe persistent asthma, uncomplicated: Secondary | ICD-10-CM | POA: Insufficient documentation

## 2024-05-27 MED ORDER — PREDNISONE 10 MG PO TABS: ORAL_TABLET | ORAL | 0 refills | Status: DC

## 2024-05-27 NOTE — Progress Notes (Signed)
 [address] [place] [address] Dept: [phone]  FOLLOW UP NOTE  Patient ID: Kevin Frank, male    DOB: 06/24/1940  Age: 83 y.o. MRN: [id number] Date of Office Visit: 05/27/2024  Assessment  Chief Complaint: Follow-up (Pt presents to the office with no concerns or questions. Here for a 6 month follow up.)  HPI Kevin Frank is an 83 year old male who presents to the clinic for follow-up visit.  He was last seen in this clinic on 11/25/2023 by Dr. 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  He has an extensive medical problem list including fatigue with normal vitamin B12 levels  Discussed the use of AI scribe software for clinical note transcription with the patient, who gave verbal consent to proceed.  History of Present Illness Kevin Frank is an 83 year old male who presents for follow-up of respiratory symptoms and allergy management.  He experiences persistent wheezing and coughing which he reports are longstanding symptoms. He continues Trelegy Ellipta  100. He reports that he is able to do pretty much what he wants. No significant trouble with breathing or catching his breath is reported. He reports that he rarely needs to use albuterol  for relief of symptoms. He continues to receive Tezspire  injections once every 28 days with no large or local reaction. He reports a significant decrease in his symptoms of asthma while continuing on Tezspire  injections.   He has occasional mild and intermittent nasal congestion, with no significant issues with rhinorrhea or sneezing. Heis not currently taking an antihistamine or using a nasal steroid spray or nasal saline rinses.   He smokes two packs of cigarettes a day. He is not interested in cutting down or quitting at this time.   His current medications are listed in the chart.  Drug Allergies:  Allergies  Allergen Reactions   Amiodarone  Hives and Rash     Physical Exam: BP (!) 148/72   Pulse 80   Temp 98.2 F (36.8 C) (Temporal)   Resp 18   Ht 5' 9 (1.753 m)   Wt 222 lb 6.4 oz (100.9 kg)   SpO2 90%   BMI 32.84 kg/m    Physical Exam Vitals reviewed.  Constitutional:      Appearance: Normal appearance.  HENT:     Head: Normocephalic and atraumatic.     Right Ear: Tympanic membrane normal.     Left Ear: Tympanic membrane normal.     Nose:     Comments: Bilateral nares slightly erythematous with thin clear nasal drianage noted. Pharynx normal. Ears normal. Eyes normal.    Mouth/Throat:     Pharynx: Oropharynx is clear.  Eyes:     Conjunctiva/sclera: Conjunctivae normal.  Cardiovascular:     Rate and Rhythm: Normal rate and regular rhythm.     Heart sounds: Normal heart sounds. No murmur heard. Pulmonary:     Effort: Pulmonary effort is normal.     Breath sounds: Normal breath sounds.     Comments: Lungs clear to auscultation Musculoskeletal:        General: Normal range of motion.     Cervical back: Normal range of motion and neck supple.  Skin:    General: Skin is warm and dry.  Neurological:     Mental Status: He is alert and oriented to person, place, and time.  Psychiatric:        Mood and Affect: Mood normal.  Behavior: Behavior normal.        Thought Content: Thought content normal.        Judgment: Judgment normal.     Diagnostics: FVC 1.88 which is 55% of predicted value. FEV1 1.23 which is 48% of predicted value. Spirometry indicates moderate airway obstruction.   Assessment and Plan: 1. Severe persistent asthma without complication (HCC)   2. Seasonal and perennial allergic rhinitis   3. Current smoker     Patient Instructions  1. Asthma/COPD overlap syndrome - Breathing test looks low today - Begin prednisone  10 mg tablets. Take 2 tablets twice a day for 3 days, then take 2 tablets once a day for 1 day, then take 1 tablet on the 5th day, then stop  - Continue with your Trelegy one puff  once daily + Tezspire  every 28 days  - May use albuterol  2 puffs every 4 hours as needed for cough, wheeze, tightness in chest, shortness of breath OR may use albuterol  via nebulizer 1 ampoule every 4 hours as needed for cough, wheeze, tightness in chest, or shortness of breath. - Continue to follow up with your pulmonary specialists Asthma control goals:  Full participation in all desired activities (may need albuterol  before activity) Albuterol  use two time or less a week on average (not counting use with activity) Cough interfering with sleep two time or less a month Oral steroids no more than once a year No hospitalizations  2. Seasonal and perennial allergic rhinitis - May use an antihistamine such as Claritin , Allegra, Xyzal, or Zyrtec once a day as needed for runny nose - Consider saline nasal rinses as needed for nasal symptoms. Use this before any medicated nasal sprays for best result  Your blood pressure was slightly elevated at today's visit. Check in with your primary care provider about your blood pressure  3. Follow up in 3 months or sooner if needed  Return in about 3 months (around 08/25/2024), or if symptoms worsen or fail to improve.    Thank you for the opportunity to care for this patient.  Please do not hesitate to contact me with questions.  Arlean Mutter, FNP [facility] 

## 2024-05-27 NOTE — Patient Instructions (Addendum)
 1. Asthma/COPD overlap syndrome - Breathing test looks low today - Begin prednisone  10 mg tablets. Take 2 tablets twice a day for 3 days, then take 2 tablets once a day for 1 day, then take 1 tablet on the 5th day, then stop  - Continue with your Trelegy one puff once daily + Tezspire  every 28 days  - May use albuterol  2 puffs every 4 hours as needed for cough, wheeze, tightness in chest, shortness of breath OR may use albuterol  via nebulizer 1 ampoule every 4 hours as needed for cough, wheeze, tightness in chest, or shortness of breath. - Continue to follow up with your pulmonary specialists Asthma control goals:  Full participation in all desired activities (may need albuterol  before activity) Albuterol  use two time or less a week on average (not counting use with activity) Cough interfering with sleep two time or less a month Oral steroids no more than once a year No hospitalizations  2. Seasonal and perennial allergic rhinitis - May use an antihistamine such as Claritin , Allegra, Xyzal, or Zyrtec once a day as needed for runny nose - Consider saline nasal rinses as needed for nasal symptoms. Use this before any medicated nasal sprays for best result  Your blood pressure was slightly elevated at today's visit. Check in with your primary care provider about your blood pressure  3. Follow up in 3 months or sooner if needed

## 2024-05-27 NOTE — Telephone Encounter (Signed)
 No answer for either number listed for the patient.  Left a message on Kevin Frank number as directed by the DPR to call the clinic to discuss a short prednisone  taper.  I will call this into the pharmacy and send out a new AVS to his address listed in the chart.

## 2024-05-30 ENCOUNTER — Telehealth: Payer: Self-pay | Admitting: *Deleted

## 2024-05-30 NOTE — Telephone Encounter (Signed)
 L/m for patient to inquire if she had gotten PAP renewal forms for Tezspire  if not will need to send to patient or come by to sign same

## 2024-06-08 ENCOUNTER — Telehealth: Payer: Self-pay

## 2024-06-08 NOTE — Telephone Encounter (Signed)
 Copied from CRM #[id number]. Topic: General - Other >> Jun 07, 2024 10:23 AM Essie A wrote: Reason for CRM: Patient's wife called to see if patient needs to bring his CPAP machine to his office visit on 06/14/24.  Please return the call at  [phone].  If no answer, leave message on voice mail.  Thanks.  Called and got vm but relayed detailed message per dpr informing pt I have their compliance report here at the office no need to bring machine at this time

## 2024-06-08 NOTE — Telephone Encounter (Signed)
 Copied from CRM #[id number]. Topic: Clinical - Request for Lab/Test Order >> Jun 08, 2024 10:38 AM Marylynn H wrote: Reason for CRM: Spoke with patients wife who is wanting to know if they can come by for a sugar check? Stating when patient last had labs done he had been eating a lot of ice cream and she is unsure if that played a role in results. Please reach out and advise if appt is needed, also had questions regarding the recommended injection  # [phone]

## 2024-06-10 NOTE — Telephone Encounter (Signed)
 Cook, Jayce G, DO     06/09/24  7:53 PM He doesn't need a sugar check. A1c is reflective of the last 3 months and A1c was done in October.

## 2024-06-14 NOTE — Telephone Encounter (Signed)
 Left message to return call

## 2024-06-15 ENCOUNTER — Encounter: Payer: Self-pay | Admitting: Pulmonary Disease

## 2024-06-15 ENCOUNTER — Ambulatory Visit: Admitting: Pulmonary Disease

## 2024-06-15 VITALS — BP 132/74 | HR 67 | Ht 69.0 in | Wt 221.0 lb

## 2024-06-15 DIAGNOSIS — J4489 Other specified chronic obstructive pulmonary disease: Secondary | ICD-10-CM

## 2024-06-15 DIAGNOSIS — C3491 Malignant neoplasm of unspecified part of right bronchus or lung: Secondary | ICD-10-CM | POA: Diagnosis not present

## 2024-06-15 DIAGNOSIS — J449 Chronic obstructive pulmonary disease, unspecified: Secondary | ICD-10-CM

## 2024-06-15 DIAGNOSIS — Z9989 Dependence on other enabling machines and devices: Secondary | ICD-10-CM

## 2024-06-15 DIAGNOSIS — F17290 Nicotine dependence, other tobacco product, uncomplicated: Secondary | ICD-10-CM | POA: Diagnosis not present

## 2024-06-15 DIAGNOSIS — G4733 Obstructive sleep apnea (adult) (pediatric): Secondary | ICD-10-CM

## 2024-06-15 MED ORDER — ALBUTEROL SULFATE HFA 108 (90 BASE) MCG/ACT IN AERS: 2.0000 | INHALATION_SPRAY | RESPIRATORY_TRACT | 2 refills | Status: AC | PRN

## 2024-06-15 MED ORDER — TRELEGY ELLIPTA 100-62.5-25 MCG/ACT IN AEPB: 1.0000 | INHALATION_SPRAY | Freq: Every day | RESPIRATORY_TRACT | 6 refills | Status: AC

## 2024-06-15 NOTE — Progress Notes (Signed)
 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  Discussed the use of AI scribe software for clinical note transcription with the patient, who gave verbal consent to proceed.  History of Present Illness Kevin Frank is an 83 year old male with sleep apnea and asthma/copd overlap who presents for management of his condition. He is accompanied by his wife.  He uses his CPAP machine consistently every night and day, which has effectively managed his sleep apnea. Despite this, he sleeps frequently, including taking naps throughout the day. His wife notes that his daily activities primarily consist of eating, smoking, and sleeping.  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 He continues to smoke, which has led to yellowing of his hands and fingernails. Despite his smoking history, he reports that his breathing is 'good.'  He uses a Trelegy inhaler once daily and an albuterol  inhaler less than once a week. He owns a nebulizer but rarely uses it. He recalls one episode of bronchitis in the past year, which was treated with prednisone  and did not require hospitalization.  He has a history of lung cancer, treated with radiation approximately five to six years ago, and he undergoes annual scans to monitor for recurrence. He is scheduled for another scan in January. He receives a monthly injection of Tezspire  to manage his respiratory condition.  He has a history of heart failure and has had three stents placed in his heart. He takes multiple medications, including blood pressure pills, a blood thinner, and a cholesterol pill.  He  received a pneumonia shot in January 2025 and a flu shot this year. His last pneumonia shot was in 2019.  He reports not drinking much water, which may contribute to his urine being 'a little bit yellow.'  No frequent use of albuterol  inhaler and nebulizer. He experiences a bad cough at times and wheezing. No recent hospitalizations for respiratory issues.  ROS   Current Medications[1]      Objective:  BP 132/74   Pulse 67   Ht 5' 9 (1.753 m)   Wt 221 lb (100.2 kg)   SpO2 94% Comment: ra  BMI 32.64 kg/m  Wt Readings from Last 3 Encounters:  06/15/24 221 lb (100.2 kg)  05/27/24 222 lb 6.4 oz (100.9 kg)  04/18/24 216 lb (98 kg)   BMI Readings from Last 3 Encounters:  06/15/24 32.64 kg/m  05/27/24 32.84 kg/m  04/18/24 30.99 kg/m   SpO2 Readings from Last 3 Encounters:  06/15/24 94%  05/27/24 90%  04/18/24 91%   Physical Exam General: NAD, alert, WD, WN Eyes: PERRL, no scleral icterus ENMT: oropharynx clear, poor dentition, no oral lesions, mallampati score IV Skin: warm, intact, no rashes Neck: JVD difficult to assess due to body habitus, ROM and lymph node assessment normal. CV: distant heart sounds, 1+ pitting peripheral edema Resp: scattered wheezes, + clubbing Neuro: Awake alert oriented to person place time and situation  Diagnostic Review:  Last CBC Lab Results  Component Value Date   WBC 12.5 (H) 04/13/2024  HGB 15.0 04/13/2024   HCT 44.9 04/13/2024   MCV 97 04/13/2024   MCH 32.4 04/13/2024   RDW 14.0 04/13/2024   PLT 177 04/13/2024   Last metabolic panel Lab Results  Component Value Date   GLUCOSE 134 (H) 04/13/2024   NA 139 04/13/2024   K 4.0 04/13/2024   CL 97 04/13/2024   CO2 27 04/13/2024   BUN 17 04/13/2024   CREATININE 1.01 04/13/2024   EGFR 74 04/13/2024   CALCIUM  10.1 04/13/2024   PROT 6.8 04/13/2024   ALBUMIN 4.3 04/13/2024   LABGLOB 2.5 04/13/2024   BILITOT 0.3 04/13/2024   ALKPHOS 117 04/13/2024   AST 16 04/13/2024   ALT  21 04/13/2024   ANIONGAP 12 12/31/2023   PSG 03/24/07 >> AHI 53, SpO2 low 74% Auto CPAP 10/22/22 to 11/20/22 >> used on 30 of 30 nights with average 11 hrs.  Average AHI 0.5 with median CPAP 9 and 95 th percentile CPAP 12 cm H2O   PFT 04/07/18 >> FEV1 2.25 (75%), ratio  0.52, TLC 8.47 (120%), RV 4.27 (164%), DLCO 47%, +BD Spirometry 11/03/22 >> FEV1 1.28 (45.39%), ratio 0.52   CT chest 11/08/22 >> atherosclerosis, mod emphysema with bronchial thickening, RUL XRT changes, stable nodules   CT chest 07/02/2023: IMPRESSION: 1. Post treatment scarring in the posteromedial right upper lobe. No evidence of recurrent or metastatic disease. 2. Aortic atherosclerosis (ICD10-I70.0). Coronary artery calcification. 3. Enlarged pulmonic trunk, indicative of pulmonary arterial hypertension. 4.  Emphysema (ICD10-J43.9).    Assessment & Plan:   Assessment & Plan OSA on CPAP Obstructive sleep apnea Well-managed with CPAP therapy, averaging ten hours per night, resolving symptoms. Residual AHI is around 3. Oxygen  integrated with CPAP. 100% compliant - Continue CPAP therapy with oxygen  integration. 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 Managed with Trelegy inhaler daily and infrequent albuterol  use. He is also on Tezspire  with Dr. Iva. Trelegy preferred for ease and effectiveness despite cost. - Continue Trelegy inhaler once daily. - Use nebulizer treatments more frequently, potentially once a day, to address wheezing. - Continue Tezspire  injections Q 4 weeks - Continue Albuterol  MDI Q 4-6 hours as needed Stage 2 moderate COPD by GOLD classification (HCC) Plan as above Squamous cell carcinoma of right lung (HCC) Hx of stage I NSCLC now s/p XRT. I reviewed last CT chest on 06/2023, no recurrence. He has repeat scan on 06/2024.  No orders of the defined types were placed in this encounter.  I spent 30 minutes reviewing patient's chart including prior  consultant notes, imaging, and PFTs as well as face-to-face with the patient, over half in discussion of the diagnosis and the importance of compliance with the treatment plan.  Return in about 6 months (around 12/14/2024).   Evanee Lubrano, MD     [1]  Current Outpatient Medications:    albuterol  (PROVENTIL ) (2.5 MG/3ML) 0.083% nebulizer solution, Take 3 mLs (2.5 mg total) by nebulization every 4 (four) hours as needed for wheezing or shortness of breath., Disp: 150 mL, Rfl: 2   allopurinol  (ZYLOPRIM ) 300 MG tablet, Take 1 tablet (300 mg total) by mouth daily., Disp: 90 tablet, Rfl: 3   amLODipine  (NORVASC ) 10 MG tablet, Take 1 tablet (10 mg total) by mouth daily., Disp: 90 tablet, Rfl: 3   apixaban  (ELIQUIS ) 5 MG TABS tablet, Take 1 tablet (5 mg total) by mouth 2 (two) times daily., Disp: 180 tablet, Rfl: 3   atorvastatin  (LIPITOR) 80 MG tablet, Take 1 tablet (80  mg total) by mouth daily., Disp: 90 tablet, Rfl: 3   clopidogrel  (PLAVIX ) 75 MG tablet, Take 1 tablet (75 mg total) by mouth daily., Disp: 90 tablet, Rfl: 3   Cobalamin Combinations (NEURIVA PLUS) CAPS, Take 1 tablet by mouth daily., Disp: , Rfl:    EPINEPHrine  0.3 mg/0.3 mL IJ SOAJ injection, Inject 0.3 mg into the muscle as needed for anaphylaxis., Disp: 1 each, Rfl: 1   famotidine  (PEPCID ) 20 MG tablet, Take 1 tablet (20 mg total) by mouth 2 (two) times daily., Disp: 180 tablet, Rfl: 3   losartan  (COZAAR ) 100 MG tablet, Take 1 tablet (100 mg total) by mouth daily., Disp: 90 tablet, Rfl: 3   memantine  (NAMENDA ) 5 MG tablet, Take 1 tablet (5 mg total) by mouth 2 (two) times daily., Disp: 180 tablet, Rfl: 3   metFORMIN  (GLUCOPHAGE ) 500 MG tablet, Take 2 tablets (1,000 mg total) by mouth 2 (two) times daily., Disp: 360 tablet, Rfl: 3   metoprolol  succinate (TOPROL -XL) 50 MG 24 hr tablet, Take 1 tablet (50 mg total) by mouth 2 (two) times daily. Take with or immediately following a meal., Disp: 180 tablet, Rfl: 3   nitroGLYCERIN   (NITROSTAT ) 0.4 MG SL tablet, Place 1 tablet (0.4 mg total) under the tongue every 5 (five) minutes as needed for chest pain., Disp: 100 tablet, Rfl: 3   predniSONE  (DELTASONE ) 10 MG tablet, Begin prednisone  10 mg tablets. Take 2 tablets twice a day for 3 days, then take 2 tablets once a day for 1 day, then take 1 tablet on the 5th day, then stop, Disp: 15 tablet, Rfl: 0   sertraline  (ZOLOFT ) 50 MG tablet, Take 1 tablet (50 mg total) by mouth daily., Disp: 90 tablet, Rfl: 3   tamsulosin  (FLOMAX ) 0.4 MG CAPS capsule, Take 1 capsule (0.4 mg total) by mouth daily after supper., Disp: 90 capsule, Rfl: 3   Tezepelumab -ekko (TEZSPIRE ) 210 MG/1. SOAJ, Inject 210 mg into the skin every 28 (twenty-eight) days., Disp: 1.91 mL, Rfl: 11   torsemide  (DEMADEX ) 20 MG tablet, Take 1 tablet (20 mg total) by mouth daily., Disp: 90 tablet, Rfl: 3   albuterol  (VENTOLIN  HFA) 108 (90 Base) MCG/ACT inhaler, Inhale 2 puffs into the lungs every 4 (four) hours as needed for wheezing or shortness of breath (cough)., Disp: 17 each, Rfl: 2   Fluticasone -Umeclidin-Vilant (TRELEGY ELLIPTA ) 100-62.5-25 MCG/ACT AEPB, Inhale 1 puff into the lungs daily., Disp: 60 each, Rfl: 6  Current Facility-Administered Medications:    tezepelumab -ekko (TEZSPIRE ) 210 MG/1. syringe 210 mg, 210 mg, Subcutaneous, Q28 days, Iva Marty Saltness, MD, 210 mg at 05/27/24 1101  "

## 2024-06-15 NOTE — Assessment & Plan Note (Signed)
 Hx of stage I NSCLC now s/p XRT. I reviewed last CT chest on 06/2023, no recurrence. He has repeat scan on 06/2024.

## 2024-06-15 NOTE — Assessment & Plan Note (Signed)
 Plan as above.

## 2024-06-15 NOTE — Assessment & Plan Note (Signed)
 Asthma-COPD overlap syndrome, Grade II, Class B. Managed with Trelegy inhaler daily and infrequent albuterol  use. He is also on Tezspire  with Dr. Iva. Trelegy preferred for ease and effectiveness despite cost. - Continue Trelegy inhaler once daily. - Use nebulizer treatments more frequently, potentially once a day, to address wheezing. - Continue Tezspire  injections Q 4 weeks - Continue Albuterol  MDI Q 4-6 hours as needed

## 2024-06-15 NOTE — Patient Instructions (Signed)
" °  VISIT SUMMARY: Today, you came in for a follow-up on your sleep apnea and other respiratory conditions. Your sleep apnea is well-managed with your CPAP machine, but you are experiencing some wheezing and a bad cough at times. We discussed your current medications and made some adjustments to help manage your symptoms better.  YOUR PLAN: OBSTRUCTIVE SLEEP APNEA: Your sleep apnea is well-managed with your CPAP machine, and you are averaging ten hours of sleep per night. -Continue using your CPAP machine with oxygen  integration every night.  ASTHMA-COPD OVERLAP SYNDROME: You have a combination of asthma and COPD, which is currently managed with your daily Trelegy inhaler and occasional use of an albuterol  inhaler. -Continue using your Trelegy inhaler once daily. -Use your nebulizer treatments more frequently, potentially once a day, to help with wheezing. -Your Trelegy inhaler prescription has been refilled.  HISTORY OF LUNG CANCER, STATUS POST RADIATION, UNDER SURVEILLANCE: You had lung cancer treated with radiation five to six years ago and are under regular surveillance with periodic scans. -Continue with your regular surveillance and attend your next scheduled scan in January.  Contains text generated by [employer].  "

## 2024-06-24 ENCOUNTER — Ambulatory Visit

## 2024-06-24 ENCOUNTER — Other Ambulatory Visit: Payer: Self-pay

## 2024-06-24 DIAGNOSIS — C3491 Malignant neoplasm of unspecified part of right bronchus or lung: Secondary | ICD-10-CM

## 2024-06-24 DIAGNOSIS — J4550 Severe persistent asthma, uncomplicated: Secondary | ICD-10-CM

## 2024-06-27 ENCOUNTER — Inpatient Hospital Stay: Payer: Medicare HMO | Attending: Internal Medicine

## 2024-06-27 ENCOUNTER — Ambulatory Visit (HOSPITAL_COMMUNITY): Payer: Medicare HMO

## 2024-06-27 DIAGNOSIS — C3491 Malignant neoplasm of unspecified part of right bronchus or lung: Secondary | ICD-10-CM

## 2024-06-28 ENCOUNTER — Inpatient Hospital Stay

## 2024-06-30 ENCOUNTER — Ambulatory Visit

## 2024-06-30 VITALS — Ht 69.0 in | Wt 220.0 lb

## 2024-06-30 DIAGNOSIS — Z0000 Encounter for general adult medical examination without abnormal findings: Secondary | ICD-10-CM

## 2024-06-30 NOTE — Patient Instructions (Signed)
 [name],  Thank you for taking the time for your Medicare Wellness Visit. I appreciate your continued commitment to your health goals. Please review the care plan we discussed, and feel free to reach out if I can assist you further.  Please note that Annual Wellness Visits do not include a physical exam. Some assessments may be limited, especially if the visit was conducted virtually. If needed, we may recommend an in-person follow-up with your provider.  Ongoing Care Seeing your primary care provider every 3 to 6 months helps us  monitor your health and provide consistent, personalized care.   1 year follow up for Medicare well visit: July 06, 2025 at 11:20 am with medicare wellness nurse in office  Recommended Screenings:  Health Maintenance  Topic Date Due   Yearly kidney health urinalysis for diabetes  Never done   Zoster (Shingles) Vaccine (1 of 2) Never done   COVID-19 Vaccine (3 - Moderna risk series) 09/12/2019   Complete foot exam   07/17/2022   Hemoglobin A1C  10/12/2024   Medicare Annual Wellness Visit  10/20/2024   Eye exam for diabetics  10/28/2024   Yearly kidney function blood test for diabetes  04/13/2025   DTaP/Tdap/Td vaccine (2 - Td or Tdap) 04/13/2027   Pneumococcal Vaccine for age over 101  Completed   Flu Shot  Completed   Meningitis B Vaccine  Aged Out       06/30/2024    1:50 PM  Advanced Directives  Does Patient Have a Medical Advance Directive? No  Would patient like information on creating a medical advance directive? No - Patient declined    Vision: Annual vision screenings are recommended for early detection of glaucoma, cataracts, and diabetic retinopathy. These exams can also reveal signs of chronic conditions such as diabetes and high blood pressure.  Dental: Annual dental screenings help detect early signs of oral cancer, gum disease, and other conditions linked to overall health, including heart disease and diabetes.  Please see the  attached documents for additional preventive care recommendations.

## 2024-06-30 NOTE — Progress Notes (Signed)
 "  Chief Complaint  Patient presents with   Medicare Wellness     Subjective:   Kevin Frank is a 84 y.o. male who presents for a Medicare Annual Wellness Visit.  Visit info / Clinical Intake: Medicare Wellness Visit Type:: Initial Annual Wellness Visit Persons participating in visit and providing information:: patient Medicare Wellness Visit Mode:: Telephone If telephone:: video error Since this visit was completed virtually, some vitals may be partially provided or unavailable. Missing vitals are due to the limitations of the virtual format.: Documented vitals are patient reported If Telephone or Video please confirm:: I connected with patient using audio/video enable telemedicine. I verified patient identity with two identifiers, discussed telehealth limitations, and patient agreed to proceed. Patient Location:: home Provider Location:: office Interpreter Needed?: No Pre-visit prep was completed: yes AWV questionnaire completed by patient prior to visit?: no Living arrangements:: lives with spouse/significant other Patient's Overall Health Status Rating: (!) poor Typical amount of pain: none Does pain affect daily life?: no Are you currently prescribed opioids?: no  Dietary Habits and Nutritional Risks How many meals a day?: 3 Eats fruit and vegetables daily?: yes Most meals are obtained by: having others provide food In the last 2 weeks, have you had any of the following?: none Diabetic:: (!) yes Any non-healing wounds?: no How often do you check your BS?: 1 Would you like to be referred to a Nutritionist or for Diabetic Management? : no  Functional Status Activities of Daily Living (to include ambulation/medication): (!) Needs Assist Feeding: Independent Dressing/Grooming: Independent Bathing: Needs assistance (wife states she has to ask him if he is going to shower) Toileting: Independent Transfer: Independent with device- listed below Ambulation: Independent  with device- listed below Home Assistive Devices/Equipment: Cane Medication Administration: Needs assistance (comment) (son fills pill box for patient) Is this a change from baseline?: Pre-admission baseline Home Management (perform basic housework or laundry): Needs assistance (comment) Manage your own finances?: yes Primary transportation is: driving Concerns about vision?: no *vision screening is required for WTM* Concerns about hearing?: no  Fall Screening Falls in the past year?: 1 Number of falls in past year: 0 Was there an injury with Fall?: 0 Fall Risk Category Calculator: 1 Patient Fall Risk Level: Low Fall Risk  Fall Risk Patient at Risk for Falls Due to: Impaired balance/gait; Impaired mobility; Orthopedic patient Fall risk Follow up: Falls evaluation completed; Education provided; Falls prevention discussed  Home and Transportation Safety: All rugs have non-skid backing?: yes All stairs or steps have railings?: yes Grab bars in the bathtub or shower?: yes Have non-skid surface in bathtub or shower?: yes Good home lighting?: yes Regular seat belt use?: yes Hospital stays in the last year:: (!) yes How many hospital stays:: 3 Reason: NSTEMI, HF, stroke  2. respiratory failure   3. pneumonia  Cognitive Assessment Difficulty concentrating, remembering, or making decisions? : yes Will 6CIT or Mini Cog be Completed: no 6CIT or Mini Cog Declined: patient declined  Advance Directives (For Healthcare) Does Patient Have a Medical Advance Directive?: No Would patient like information on creating a medical advance directive?: No - Patient declined  Reviewed/Updated  Reviewed/Updated: Reviewed All (Medical, Surgical, Family, Medications, Allergies, Care Teams, Patient Goals)    Allergies (verified) Amiodarone    Current Medications (verified) Outpatient Encounter Medications as of 06/30/2024  Medication Sig   albuterol  (PROVENTIL ) (2.5 MG/3ML) 0.083% nebulizer  solution Take 3 mLs (2.5 mg total) by nebulization every 4 (four) hours as needed for wheezing or shortness of  breath.   albuterol  (VENTOLIN  HFA) 108 (90 Base) MCG/ACT inhaler Inhale 2 puffs into the lungs every 4 (four) hours as needed for wheezing or shortness of breath (cough).   allopurinol  (ZYLOPRIM ) 300 MG tablet Take 1 tablet (300 mg total) by mouth daily.   amLODipine  (NORVASC ) 10 MG tablet Take 1 tablet (10 mg total) by mouth daily.   apixaban  (ELIQUIS ) 5 MG TABS tablet Take 1 tablet (5 mg total) by mouth 2 (two) times daily.   atorvastatin  (LIPITOR) 80 MG tablet Take 1 tablet (80 mg total) by mouth daily.   clopidogrel  (PLAVIX ) 75 MG tablet Take 1 tablet (75 mg total) by mouth daily.   Cobalamin Combinations (NEURIVA PLUS) CAPS Take 1 tablet by mouth daily.   EPINEPHrine  0.3 mg/0.3 mL IJ SOAJ injection Inject 0.3 mg into the muscle as needed for anaphylaxis.   famotidine  (PEPCID ) 20 MG tablet Take 1 tablet (20 mg total) by mouth 2 (two) times daily.   Fluticasone -Umeclidin-Vilant (TRELEGY ELLIPTA ) 100-62.5-25 MCG/ACT AEPB Inhale 1 puff into the lungs daily.   losartan  (COZAAR ) 100 MG tablet Take 1 tablet (100 mg total) by mouth daily.   memantine  (NAMENDA ) 5 MG tablet Take 1 tablet (5 mg total) by mouth 2 (two) times daily.   metFORMIN  (GLUCOPHAGE ) 500 MG tablet Take 2 tablets (1,000 mg total) by mouth 2 (two) times daily.   metoprolol  succinate (TOPROL -XL) 50 MG 24 hr tablet Take 1 tablet (50 mg total) by mouth 2 (two) times daily. Take with or immediately following a meal.   nitroGLYCERIN  (NITROSTAT ) 0.4 MG SL tablet Place 1 tablet (0.4 mg total) under the tongue every 5 (five) minutes as needed for chest pain.   predniSONE  (DELTASONE ) 10 MG tablet Begin prednisone  10 mg tablets. Take 2 tablets twice a day for 3 days, then take 2 tablets once a day for 1 day, then take 1 tablet on the 5th day, then stop   sertraline  (ZOLOFT ) 50 MG tablet Take 1 tablet (50 mg total) by mouth daily.    tamsulosin  (FLOMAX ) 0.4 MG CAPS capsule Take 1 capsule (0.4 mg total) by mouth daily after supper.   Tezepelumab -ekko (TEZSPIRE ) 210 MG/1. SOAJ Inject 210 mg into the skin every 28 (twenty-eight) days.   torsemide  (DEMADEX ) 20 MG tablet Take 1 tablet (20 mg total) by mouth daily.   Facility-Administered Encounter Medications as of 06/30/2024  Medication   tezepelumab -ekko (TEZSPIRE ) 210 MG/1. 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    Normal nuclear stress test 2011   Obesity    Obstructive sleep apnea on CPAP    Presence of indwelling urinary catheter    Shortness of breath    due to cigarette abuse   Tobacco abuse    Past Surgical History:  Procedure Laterality Date   ABDOMINAL AORTIC ENDOVASCULAR FENESTRATED STENT GRAFT N/A 04/09/2016   Procedure: ABDOMINAL AORTIC ENDOVASCULAR FENESTRATED STENT GRAFT with left  brachial artery access using ultrasound guidance;  Surgeon: Gaile LELON New, MD;  Location: [facility] OR;  Service: Vascular;  Laterality: N/A;   CHOLECYSTECTOMY  1997   [facility]   COLONOSCOPY  2006   2006: multiple diminutive polyps in rectum, one pedunculated polyp at 10 cm s/p snare removal. Left-sided diverticulum, pedunculated polyps at 35 cm and splenic flexure s/p snare removal. No path available.    COLONOSCOPY WITH PROPOFOL  N/A 11/24/2019   Procedure: COLONOSCOPY WITH PROPOFOL ;  Surgeon: Shaaron Lamar HERO, MD;  Location: [facility] ENDO SUITE;  Service: Endoscopy;  Laterality: N/A;  9:45am   CORONARY/GRAFT ACUTE MI REVASCULARIZATION  N/A 08/04/2023   Procedure: Coronary/Graft Acute MI Revascularization;  Surgeon: Wendel Lurena POUR, MD;  Location: [facility] INVASIVE CV LAB;  Service: Cardiovascular;  Laterality: N/A;   PERIPHERAL VASCULAR CATHETERIZATION N/A 04/10/2016   Procedure: Renal Angiography;  Surgeon: Lonni GORMAN Blade, MD;  Location: [facility] INVASIVE CV LAB;  Service: Cardiovascular;  Laterality: N/A;   POLYPECTOMY  11/24/2019   Procedure: POLYPECTOMY;  Surgeon: Shaaron Lamar HERO, MD;  Location: [facility] END[facility];  Service: Endoscopy;;   TRANSURETHRAL RESECTION OF PROSTATE  01/07/2011   Procedure: TRANSURETHRAL RESECTION OF THE PROSTATE (TURP);  Surgeon: Mohammad I Javaid;  Location: [facility] ORS;  Service: Urology;  Laterality: N/A;   Family History  Problem Relation Age of Onset   Liver disease Father    Lymphoma Mother    Diabetes Mother    Hypertension Mother    Cancer Mother    Mental retardation Other        sibling -- from birth    Asthma Daughter    Allergic rhinitis Neg Hx    Angioedema Neg Hx    Atopy Neg Hx    Eczema Neg Hx    Immunodeficiency Neg Hx    Colon cancer Neg Hx    Social History   Occupational History   Not on file  Tobacco Use   Smoking status: Every Day    Current packs/day: 2.00    Average packs/day: 2.0 packs/day for 50.0 years (100.0 ttl pk-yrs)    Types: Cigarettes    Passive exposure: Never   Smokeless tobacco: Never   Tobacco comments:    Smokes 2 packs a day MRC 10/17/2021   Vaping Use   Vaping status: Never Used  Substance and Sexual Activity   Alcohol use: Yes    Alcohol/week: 0.0 standard drinks of alcohol    Comment: very little   Drug use: No   Sexual activity: Not on file   Tobacco Counseling Ready to quit: No Counseling given: Yes Tobacco comments: Smokes 2 packs a day MRC 10/17/2021   SDOH Screenings   Food Insecurity: No Food Insecurity (06/30/2024)  Housing: Low Risk (06/30/2024)  Transportation Needs: No Transportation Needs (06/30/2024)  Utilities: Not At Risk  (06/30/2024)  Depression (PHQ2-9): Low Risk (06/30/2024)  Recent Concern: Depression (PHQ2-9) - Medium Risk (04/13/2024)  Financial Resource Strain: Low Risk (03/05/2022)  Physical Activity: Inactive (06/30/2024)  Social Connections: Moderately Isolated (06/30/2024)  Stress: No Stress Concern Present (06/30/2024)  Tobacco Use: High Risk (06/30/2024)  Health Literacy: Adequate Health Literacy (06/30/2024)   See flowsheets for full screening details  Depression Screen PHQ 2 & 9 Depression Scale- Over the past 2 weeks, how often have you been bothered by any of the following problems? Little interest or pleasure in doing things: 0 Feeling down, depressed, or hopeless (PHQ Adolescent also includes...irritable): 0 PHQ-2 Total Score: 0 Trouble falling or staying asleep, or  sleeping too much: 0 Feeling tired or having little energy: 3 Poor appetite or overeating (PHQ Adolescent also includes...weight loss): 0 Feeling bad about yourself - or that you are a failure or have let yourself or your family down: 0 Trouble concentrating on things, such as reading the newspaper or watching television (PHQ Adolescent also includes...like school work): 0 Moving or speaking so slowly that other people could have noticed. Or the opposite - being so fidgety or restless that you have been moving around a lot more than usual: 0 Thoughts that you would be better off dead, or of hurting yourself in some way: 0 PHQ-9 Total Score: 3 If you checked off any problems, how difficult have these problems made it for you to do your work, take care of things at home, or get along with other people?: Not difficult at all  Depression Treatment Depression Interventions/Treatment : [id number] Score <4 Follow-up Not Indicated     Goals Addressed             This Visit's Progress    increase activity and energy level               Objective:    Today's Vitals   06/30/24 1329  Weight: 220 lb (99.8 kg)  Height: 5' 9 (1.753 m)    Body mass index is 32.49 kg/m.  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   Vision Screening - Comments:: Wears rx glasses - up to date with routine eye exams with  Willma Moats Immunizations and Health Maintenance Health Maintenance  Topic Date Due   Diabetic kidney evaluation - Urine ACR  Never done   Zoster Vaccines- Shingrix (1 of 2) Never done   COVID-19 Vaccine (3 - Moderna risk series) 09/12/2019   FOOT EXAM  07/17/2022   HEMOGLOBIN A1C  10/12/2024   Medicare Annual Wellness (AWV)  10/20/2024   OPHTHALMOLOGY EXAM  10/28/2024   Diabetic kidney evaluation - eGFR measurement  04/13/2025   DTaP/Tdap/Td (2 - Td or Tdap) 04/13/2027   Pneumococcal Vaccine: 50+ Years  Completed   Influenza Vaccine  Completed   Meningococcal B Vaccine  Aged Out        Assessment/Plan:  This is a routine wellness examination for Kim.  Patient Care Team: Cook, Jayce G, DO as PCP - General (Family Medicine) Shaaron, Lamar HERO, MD as Consulting Physician (Gastroenterology) Darlean Ozell NOVAK, MD as Consulting Physician (Pulmonary Disease) Catherine Cools, MD as Consulting Physician (Pulmonary Disease) Iva Marty Saltness, MD as Consulting Physician (Allergy and Immunology) Alvan Dorn FALCON, MD as Consulting Physician (Cardiology) Dr [facility], OD  I have personally reviewed and noted the following in the patients chart:   Medical and social history Use of alcohol, tobacco or illicit drugs  Current medications and supplements including opioid prescriptions. Functional ability and status Nutritional status Physical activity Advanced directives List of other physicians Hospitalizations, surgeries, and ER visits in previous 12 months Vitals Screenings to include cognitive, depression, and falls Referrals and  appointments  No orders of the defined types were placed in this encounter.  In addition, I have reviewed and discussed with patient certain preventive protocols, quality metrics, and best practice recommendations. A written personalized care plan for preventive services as well as general preventive health recommendations were provided to patient.   Lynnmarie Lovett, CMA  06/30/2024   Return July 06, 2025 at 1120, for In office Medicare Well Visit w  Wellness Nurse.  After Visit Summary: (Mail) Due to this being a telephonic visit, the after visit summary with patients personalized plan was offered to patient via mail    "

## 2024-07-01 ENCOUNTER — Telehealth: Payer: Self-pay | Admitting: Cardiology

## 2024-07-01 NOTE — Telephone Encounter (Signed)
 Spoke with wife who states that she spoke with the PCP yesterday. She states that she was told that his appt with cardiology needs to be moved up. When asked about sx wife states that he is sleeping a lot and having difficulty with his short term memory. Wife denies chest pain at this time. She states that he does get SOB d/t smoking. No other complaints at this time. I encouraged wife to reach out to PCP's office, but she is addiment on moving up his appt. I did reach out to the PCP office to see if they may be able to add information regarding the pt. Please advise.

## 2024-07-01 NOTE — Telephone Encounter (Signed)
 Pts wife states she was told to call and discuss the pts wellness appointment and she would also like the pt to wear a zio monitor

## 2024-07-06 ENCOUNTER — Inpatient Hospital Stay: Admitting: Oncology

## 2024-07-06 NOTE — Telephone Encounter (Signed)
 Call placed to Dr. Metta office requesting office note or documentation be faxed to us  regarding his cardiac issues.    Will await reply from pcp.

## 2024-07-07 ENCOUNTER — Inpatient Hospital Stay: Payer: Medicare HMO | Admitting: Oncology

## 2024-07-12 NOTE — Telephone Encounter (Signed)
 Left message with PCP office. Spoke with Tiffany and advised if PCP had a request for cardiac services, to either call office or send request via EPIC.

## 2024-07-14 ENCOUNTER — Encounter: Payer: Self-pay | Admitting: Family Medicine

## 2024-07-14 ENCOUNTER — Ambulatory Visit: Admitting: Family Medicine

## 2024-07-14 DIAGNOSIS — I480 Paroxysmal atrial fibrillation: Secondary | ICD-10-CM | POA: Diagnosis not present

## 2024-07-14 DIAGNOSIS — J9611 Chronic respiratory failure with hypoxia: Secondary | ICD-10-CM

## 2024-07-14 DIAGNOSIS — J4550 Severe persistent asthma, uncomplicated: Secondary | ICD-10-CM

## 2024-07-14 DIAGNOSIS — F0390 Unspecified dementia without behavioral disturbance: Secondary | ICD-10-CM

## 2024-07-14 DIAGNOSIS — F322 Major depressive disorder, single episode, severe without psychotic features: Secondary | ICD-10-CM

## 2024-07-14 DIAGNOSIS — E1159 Type 2 diabetes mellitus with other circulatory complications: Secondary | ICD-10-CM

## 2024-07-14 DIAGNOSIS — I5032 Chronic diastolic (congestive) heart failure: Secondary | ICD-10-CM | POA: Diagnosis not present

## 2024-07-14 NOTE — Assessment & Plan Note (Signed)
 Follows with pulmonology and allergy.

## 2024-07-14 NOTE — Assessment & Plan Note (Signed)
 Stable on sertraline.

## 2024-07-14 NOTE — Assessment & Plan Note (Signed)
 Continue Namenda

## 2024-07-14 NOTE — Assessment & Plan Note (Signed)
Stable currently. Continue current medications. 

## 2024-07-14 NOTE — Assessment & Plan Note (Signed)
 Uncontrolled.  Would benefit from GLP-1.  Will continue to monitor.

## 2024-07-14 NOTE — Assessment & Plan Note (Signed)
 Not currently on oxygen  therapy.  He is not interested in smoke cessation

## 2024-07-14 NOTE — Progress Notes (Signed)
 "  Subjective:  Patient ID: Kevin Frank, male    DOB: 03-07-1941  Age: 84 y.o. MRN: [id number]  CC: Follow-up   HPI:  84 year old male with an extensive PMH presents for follow up.  Patient accompanied by his wife today.   He is very hard of hearing.  She would like me to examine his ears today.  He has no complaints at this time.  Continues to smoke.  Is not interested in cessation.  Blood pressure is decently controlled given advanced age.  Patient follows with allergy and pulmonology regarding asthma, COPD, and history of lung cancer.  Also follows with pulmonology regarding OSA.  Follows closely with cardiology regarding coronary disease and heart failure.  Patient's type 2 diabetes is uncontrolled.  Last A1c 8.5.  He is currently on metformin  only.  Would benefit from GLP-1.  Patient Active Problem List   Diagnosis Date Noted   Chronic respiratory failure with hypoxia (HCC) 07/14/2024   Dementia without behavioral disturbance, psychotic disturbance, mood disturbance, or anxiety, unspecified dementia severity, unspecified dementia type (HCC) 07/14/2024   Moderately severe major depression (HCC) 07/14/2024   Severe persistent asthma without complication (HCC) 05/27/2024   CAD (coronary artery disease) 04/13/2024   Depression 04/13/2024   Chronic heart failure with preserved ejection fraction (HFpEF) (HCC) 08/11/2023   S/P right coronary artery (RCA) stent placement 08/10/2023   Paroxysmal atrial fibrillation (HCC) 08/10/2023   Chronic gouty arthritis 07/14/2022   Polyarthralgia 07/14/2022   Type 2 diabetes mellitus with cardiac complication (HCC) 06/27/2019   Mixed hyperlipidemia 06/27/2019   Class 1 obesity due to excess calories with serious comorbidity and body mass index (BMI) of 32.0 to 32.9 in adult 06/27/2019   Seasonal and perennial allergic rhinitis 09/22/2017   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   Obstructive sleep apnea on CPAP 04/23/2017    Chronic knee pain 04/23/2017   Current smoker 03/05/2016   Stage 2 moderate COPD by GOLD classification (HCC) 03/05/2016   AAA (abdominal aortic aneurysm) without rupture 06/14/2015   Tobacco use 06/09/2007   Essential hypertension 05/06/2007    Social Hx   Social History   Socioeconomic History   Marital status: Married    Spouse name: Niels   Number of children: 2   Years of education: Not on file   Highest education level: Not on file  Occupational History   Not on file  Tobacco Use   Smoking status: Every Day    Current packs/day: 2.00    Average packs/day: 2.0 packs/day for 50.0 years (100.0 ttl pk-yrs)    Types: Cigarettes    Passive exposure: Never   Smokeless tobacco: Never   Tobacco comments:    Smokes 2 packs a day MRC 10/17/2021   Vaping Use   Vaping status: Never Used  Substance and Sexual Activity   Alcohol use: Yes    Alcohol/week: 0.0 standard drinks of alcohol    Comment: very little   Drug use: No   Sexual activity: Not on file  Other Topics Concern   Not on file  Social History Narrative   Lives at home with wife   Left handed   Previous farmer   Social Drivers of Health   Tobacco Use: High Risk (07/14/2024)   Patient History    Smoking Tobacco Use: Every Day    Smokeless Tobacco Use: Never    Passive Exposure: Never  Financial Resource Strain: Low Risk (03/05/2022)   Overall Financial Resource Strain (CARDIA)  Difficulty of Paying Living Expenses: Not hard at all  Food Insecurity: No Food Insecurity (06/30/2024)   Epic    Worried About Programme Researcher, Broadcasting/film/video in the Last Year: Never true    Ran Out of Food in the Last Year: Never true  Transportation Needs: No Transportation Needs (06/30/2024)   Epic    Lack of Transportation (Medical): No    Lack of Transportation (Non-Medical): No  Physical Activity: Inactive (06/30/2024)   Exercise Vital Sign    Days of Exercise per Week: 0 days    Minutes of Exercise per Session: 0 min  Stress: No Stress  Concern Present (06/30/2024)   [employer] of Occupational Health - Occupational Stress Questionnaire    Feeling of Stress: Not at all  Social Connections: Moderately Isolated (06/30/2024)   Social Connection and Isolation Panel    Frequency of Communication with Friends and Family: More than three times a week    Frequency of Social Gatherings with Friends and Family: Once a week    Attends Religious Services: Never    Database Administrator or Organizations: No    Attends Banker Meetings: Never    Marital Status: Married  Depression (PHQ2-9): High Risk (07/14/2024)   Depression (PHQ2-9)    PHQ-2 Score: 17  Alcohol Screen: Not on file  Housing: Low Risk (06/30/2024)   Epic    Unable to Pay for Housing in the Last Year: No    Number of Times Moved in the Last Year: 0    Homeless in the Last Year: No  Utilities: Not At Risk (06/30/2024)   Epic    Threatened with loss of utilities: No  Health Literacy: Adequate Health Literacy (06/30/2024)   B1300 Health Literacy    Frequency of need for help with medical instructions: Never    Review of Systems Per HPI  Objective:  BP (!) 140/62   Pulse 69   Ht 5' 9 (1.753 m)   Wt 222 lb (100.7 kg)   SpO2 91%   BMI 32.78 kg/m      07/14/2024    9:59 AM 07/14/2024    9:58 AM 06/30/2024    1:29 PM  BP/Weight  Systolic BP 140 153 --  Diastolic BP 62 76 --  Wt. (Lbs)  222 220  BMI  32.78 kg/m2 32.49 kg/m2    Physical Exam Vitals and nursing note reviewed.  Constitutional:      General: He is not in acute distress.    Appearance: Normal appearance. He is obese.  HENT:     Head: Normocephalic and atraumatic.     Right Ear: Tympanic membrane normal.     Left Ear: Tympanic membrane normal.  Eyes:     General:        Right eye: No discharge.        Left eye: No discharge.     Conjunctiva/sclera: Conjunctivae normal.  Cardiovascular:     Rate and Rhythm: Normal rate and regular rhythm.  Pulmonary:     Effort: Pulmonary  effort is normal. No respiratory distress.     Breath sounds: Rales present.  Feet:     Comments: Diabetic foot exam performed today.  See quality metrics section. Neurological:     Mental Status: He is alert.     Lab Results  Component Value Date   WBC 12.5 (H) 04/13/2024   HGB 15.0 04/13/2024   HCT 44.9 04/13/2024   PLT 177 04/13/2024   GLUCOSE 134 (H)  04/13/2024   CHOL 103 04/13/2024   TRIG 144 04/13/2024   HDL 38 (L) 04/13/2024   LDLCALC 40 04/13/2024   ALT 21 04/13/2024   AST 16 04/13/2024   NA 139 04/13/2024   K 4.0 04/13/2024   CL 97 04/13/2024   CREATININE 1.01 04/13/2024   BUN 17 04/13/2024   CO2 27 04/13/2024   TSH 0.378 08/07/2023   PSA 2.0 10/09/2016   INR 1.3 (H) 08/04/2023   HGBA1C 8.5 (H) 04/13/2024     Assessment & Plan:  Chronic respiratory failure with hypoxia [facility]) Assessment & Plan: Not currently on oxygen  therapy.  He is not interested in smoke cessation   Dementia without behavioral disturbance, psychotic disturbance, mood disturbance, or anxiety, unspecified dementia severity, unspecified dementia type (HCC) Assessment & Plan: Continue Namenda .   Moderately severe major depression (HCC) Assessment & Plan: Stable on sertraline .   Severe persistent asthma without complication [facility]) Assessment & Plan: Follows with pulmonology and allergy.   Chronic heart failure with preserved ejection fraction (HFpEF) (HCC) Assessment & Plan: Stable currently.  Continue current medications.   Type 2 diabetes mellitus with cardiac complication [facility]) Assessment & Plan: Uncontrolled.  Would benefit from GLP-1.  Will continue to monitor.  Orders: -     Microalbumin / creatinine urine ratio  Paroxysmal atrial fibrillation (HCC) Assessment & Plan: Stable on Eliquis  and metoprolol .  Continue.     Follow-up: 3 months  Daleigh Pollinger Bluford DO [facility] "

## 2024-07-14 NOTE — Patient Instructions (Signed)
 Continue your medications.  Follow up in 3 months.

## 2024-07-14 NOTE — Assessment & Plan Note (Signed)
Stable on Eliquis and metoprolol.  Continue.

## 2024-07-15 LAB — MICROALBUMIN / CREATININE URINE RATIO
Creatinine, Urine: 21.2 mg/dL
Microalb/Creat Ratio: 171 mg/g{creat} — ABNORMAL HIGH (ref 0–29)
Microalbumin, Urine: 36.2 ug/mL

## 2024-07-18 ENCOUNTER — Ambulatory Visit (HOSPITAL_COMMUNITY)

## 2024-07-18 ENCOUNTER — Inpatient Hospital Stay

## 2024-07-25 ENCOUNTER — Ambulatory Visit

## 2024-07-26 ENCOUNTER — Ambulatory Visit (HOSPITAL_COMMUNITY)
Admission: RE | Admit: 2024-07-26 | Discharge: 2024-07-26 | Disposition: A | Source: Ambulatory Visit | Attending: Oncology

## 2024-07-26 ENCOUNTER — Inpatient Hospital Stay

## 2024-07-26 DIAGNOSIS — C3491 Malignant neoplasm of unspecified part of right bronchus or lung: Secondary | ICD-10-CM

## 2024-07-26 LAB — CBC WITH DIFFERENTIAL/PLATELET
Abs Immature Granulocytes: 0.05 10*3/uL (ref 0.00–0.07)
Basophils Absolute: 0.1 10*3/uL (ref 0.0–0.1)
Basophils Relative: 1 %
Eosinophils Absolute: 0.1 10*3/uL (ref 0.0–0.5)
Eosinophils Relative: 1 %
HCT: 44.8 % (ref 39.0–52.0)
Hemoglobin: 14.9 g/dL (ref 13.0–17.0)
Immature Granulocytes: 1 %
Lymphocytes Relative: 25 %
Lymphs Abs: 2.8 10*3/uL (ref 0.7–4.0)
MCH: 32.1 pg (ref 26.0–34.0)
MCHC: 33.3 g/dL (ref 30.0–36.0)
MCV: 96.6 fL (ref 80.0–100.0)
Monocytes Absolute: 1.1 10*3/uL — ABNORMAL HIGH (ref 0.1–1.0)
Monocytes Relative: 10 %
Neutro Abs: 6.8 10*3/uL (ref 1.7–7.7)
Neutrophils Relative %: 62 %
Platelets: 162 10*3/uL (ref 150–400)
RBC: 4.64 MIL/uL (ref 4.22–5.81)
RDW: 14.6 % (ref 11.5–15.5)
WBC: 10.9 10*3/uL — ABNORMAL HIGH (ref 4.0–10.5)
nRBC: 0 % (ref 0.0–0.2)

## 2024-07-26 LAB — COMPREHENSIVE METABOLIC PANEL WITH GFR
ALT: 17 U/L (ref 0–44)
AST: 16 U/L (ref 15–41)
Albumin: 4.5 g/dL (ref 3.5–5.0)
Alkaline Phosphatase: 134 U/L — ABNORMAL HIGH (ref 38–126)
Anion gap: 15 (ref 5–15)
BUN: 18 mg/dL (ref 8–23)
CO2: 27 mmol/L (ref 22–32)
Calcium: 10.2 mg/dL (ref 8.9–10.3)
Chloride: 98 mmol/L (ref 98–111)
Creatinine, Ser: 1.1 mg/dL (ref 0.61–1.24)
GFR, Estimated: >60 mL/min
Glucose, Bld: 226 mg/dL — ABNORMAL HIGH (ref 70–99)
Potassium: 4.2 mmol/L (ref 3.5–5.1)
Sodium: 140 mmol/L (ref 135–145)
Total Bilirubin: 0.4 mg/dL (ref 0.0–1.2)
Total Protein: 7.7 g/dL (ref 6.5–8.1)

## 2024-07-26 MED ORDER — IOHEXOL 300 MG/ML  SOLN
80.0000 mL | Freq: Once | INTRAMUSCULAR | Status: AC | PRN
  Administered 2024-07-26: 80 mL via INTRAVENOUS

## 2024-08-01 ENCOUNTER — Ambulatory Visit

## 2024-08-03 ENCOUNTER — Inpatient Hospital Stay: Admitting: Oncology

## 2024-08-29 ENCOUNTER — Ambulatory Visit: Admitting: Cardiology

## 2024-10-12 ENCOUNTER — Ambulatory Visit: Admitting: Family Medicine

## 2024-10-17 ENCOUNTER — Ambulatory Visit: Admitting: Surgery

## 2024-11-25 ENCOUNTER — Ambulatory Visit: Admitting: Allergy & Immunology

## 2024-12-19 ENCOUNTER — Ambulatory Visit: Admitting: Pulmonary Disease

## 2025-07-06 ENCOUNTER — Ambulatory Visit
